# Patient Record
Sex: Male | Born: 1945 | Hispanic: No | Marital: Married | Smoking: Former smoker
Health system: Southern US, Community
[De-identification: ages and names within clinical notes are randomized; demographics above are authoritative.]

## PROBLEM LIST (undated history)

## (undated) DIAGNOSIS — R9431 Abnormal electrocardiogram [ECG] [EKG]: Secondary | ICD-10-CM

## (undated) DIAGNOSIS — C787 Secondary malignant neoplasm of liver and intrahepatic bile duct: Secondary | ICD-10-CM

## (undated) DIAGNOSIS — B1910 Unspecified viral hepatitis B without hepatic coma: Secondary | ICD-10-CM

## (undated) DIAGNOSIS — C189 Malignant neoplasm of colon, unspecified: Secondary | ICD-10-CM

## (undated) DIAGNOSIS — I10 Essential (primary) hypertension: Secondary | ICD-10-CM

## (undated) DIAGNOSIS — K259 Gastric ulcer, unspecified as acute or chronic, without hemorrhage or perforation: Secondary | ICD-10-CM

## (undated) DIAGNOSIS — A048 Other specified bacterial intestinal infections: Secondary | ICD-10-CM

## (undated) DIAGNOSIS — E119 Type 2 diabetes mellitus without complications: Secondary | ICD-10-CM

## (undated) DIAGNOSIS — R000 Tachycardia, unspecified: Secondary | ICD-10-CM

## (undated) DIAGNOSIS — I428 Other cardiomyopathies: Secondary | ICD-10-CM

## (undated) HISTORY — DX: Gastric ulcer, unspecified as acute or chronic, without hemorrhage or perforation: K25.9

## (undated) HISTORY — DX: Abnormal electrocardiogram (ECG) (EKG): R94.31

## (undated) HISTORY — DX: Malignant neoplasm of colon, unspecified: C78.7

## (undated) HISTORY — DX: Other cardiomyopathies: I42.8

## (undated) HISTORY — DX: Essential (primary) hypertension: I10

## (undated) HISTORY — DX: Other specified bacterial intestinal infections: A04.8

## (undated) HISTORY — DX: Tachycardia, unspecified: R00.0

## (undated) HISTORY — DX: Unspecified viral hepatitis B without hepatic coma: B19.10

## (undated) HISTORY — DX: Secondary malignant neoplasm of liver and intrahepatic bile duct: C18.9

---

## 1998-04-04 ENCOUNTER — Other Ambulatory Visit: Admission: RE | Admit: 1998-04-04 | Discharge: 1998-04-04 | Payer: Self-pay | Admitting: Family Medicine

## 1998-05-01 ENCOUNTER — Ambulatory Visit (HOSPITAL_COMMUNITY): Admission: RE | Admit: 1998-05-01 | Discharge: 1998-05-01 | Payer: Self-pay | Admitting: Gastroenterology

## 2013-02-26 ENCOUNTER — Other Ambulatory Visit (HOSPITAL_COMMUNITY): Payer: Self-pay | Admitting: Internal Medicine

## 2013-02-26 DIAGNOSIS — R079 Chest pain, unspecified: Secondary | ICD-10-CM

## 2013-03-03 ENCOUNTER — Ambulatory Visit (HOSPITAL_COMMUNITY)
Admission: RE | Admit: 2013-03-03 | Discharge: 2013-03-03 | Disposition: A | Discharge status: Home / Self Care | Payer: BC Managed Care – PPO | Source: Ambulatory Visit | Attending: Internal Medicine | Admitting: Internal Medicine

## 2013-03-03 DIAGNOSIS — I428 Other cardiomyopathies: Secondary | ICD-10-CM | POA: Insufficient documentation

## 2013-03-03 DIAGNOSIS — R9431 Abnormal electrocardiogram [ECG] [EKG]: Secondary | ICD-10-CM | POA: Insufficient documentation

## 2013-03-03 DIAGNOSIS — R42 Dizziness and giddiness: Secondary | ICD-10-CM | POA: Insufficient documentation

## 2013-03-03 DIAGNOSIS — R0602 Shortness of breath: Secondary | ICD-10-CM | POA: Insufficient documentation

## 2013-03-03 DIAGNOSIS — R079 Chest pain, unspecified: Secondary | ICD-10-CM | POA: Insufficient documentation

## 2013-03-03 DIAGNOSIS — I10 Essential (primary) hypertension: Secondary | ICD-10-CM | POA: Insufficient documentation

## 2013-03-03 DIAGNOSIS — R002 Palpitations: Secondary | ICD-10-CM | POA: Insufficient documentation

## 2013-03-03 HISTORY — PX: OTHER SURGICAL HISTORY: SHX169

## 2013-03-03 MED ORDER — TECHNETIUM TC 99M SESTAMIBI GENERIC - CARDIOLITE
10.4000 | Freq: Once | INTRAVENOUS | Status: AC | PRN
  Administered 2013-03-03: 10 via INTRAVENOUS

## 2013-03-03 MED ORDER — TECHNETIUM TC 99M SESTAMIBI GENERIC - CARDIOLITE
30.8000 | Freq: Once | INTRAVENOUS | Status: AC | PRN
  Administered 2013-03-03: 30.8 via INTRAVENOUS

## 2013-03-03 MED ORDER — REGADENOSON 0.4 MG/5ML IV SOLN
0.4000 mg | Freq: Once | INTRAVENOUS | Status: AC
  Administered 2013-03-03: 0.4 mg via INTRAVENOUS

## 2013-03-03 NOTE — Procedures (Addendum)
[facility] [address] [place] [address] [place] [address] [phone]  Cardiology Nuclear Med Study  Jason Reilly is a 67 y.o. male     MRN : [id number]     DOB: [birth year]  Procedure Date: 03/03/2013  Nuclear Med Background Indication for Stress Test:  Evaluation for Ischemia and Abnormal EKG History:  NO CARDIAC OR RESPIRATORY HISTORY REPORTED BY PT. Cardiac Risk Factors: History of Smoking and Hypertension  Symptoms:  Chest Pain, Dizziness, Palpitations and SOB   Nuclear Pre-Procedure Caffeine/Decaff Intake:  1:00am NPO After: 11AM   IV Site: R Hand  IV 0.9% NS with Angio Cath:  22g  Chest Size (in):  38"  IV Started by: Emmit Pomfret, RN  Height: 5\' 5"  (1.651 m)  Cup Size: n/a  BMI:  Body mass index is 26.79 kg/(m^2). Weight:  161 lb (73.029 kg)   Tech Comments:  N/A    Nuclear Med Study 1 or 2 day study: 1 day  Stress Test Type:  Lexiscan  Order Authorizing Provider:  Zoila Shutter, MD   Resting Radionuclide: Technetium 74m Sestamibi  Resting Radionuclide Dose: 10.4 mCi   Stress Radionuclide:  Technetium 78m Sestamibi  Stress Radionuclide Dose: 30.8 mCi           Stress Protocol Rest HR: 79 Stress HR: 97  Rest BP:126/81 Stress BP: 128/70  Exercise Time (min): n/a METS: n/a          Dose of Adenosine (mg):  n/a Dose of Lexiscan: 0.4 mg  Dose of Atropine (mg): n/a Dose of Dobutamine: n/a mcg/kg/min (at max HR)  Stress Test Technologist: Ernestene Mention, CCT Nuclear Technologist: Gonzella Lex, CNMT   Rest Procedure:  Myocardial perfusion imaging was performed at rest 45 minutes following the intravenous administration of Technetium 78m Sestamibi. Stress Procedure:  The patient received IV Lexiscan 0.4 mg over 15-seconds.  Technetium 70m Sestamibi injected at 30-seconds.  There were no significant changes with Lexiscan.  Quantitative spect images were obtained after a 45 minute delay.  Transient Ischemic Dilatation (Normal  <1.22):  0.98 Lung/Heart Ratio (Normal <0.45):  0.36 QGS EDV:  264 ml QGS ESV:  199 ml LV Ejection Fraction: 25%  Rest ECG: NSR-LVH and T wave inversions inferiorly  Stress ECG: No significant change from baseline ECG  QPS Raw Data Images:  Normal; no motion artifact; normal heart/lung ratio. Stress Images:  Fixed inferobasal and inferoapical attenuation artifact Rest Images:  Fixed inferobasal and inferoapical attenuation artifact Subtraction (SDS):  No evidence of ischemia.  Impression Exercise Capacity:  Lexiscan with no exercise. BP Response:  Normal blood pressure response. Clinical Symptoms:  No significant symptoms noted. ECG Impression:  There are scattered PVCs. Comparison with Prior Nuclear Study: No previous nuclear study performed  Overall Impression:  High risk stress nuclear study with fixed basal inferior and apical inferior bowel artifact..Findings consistent with a non-ischemic cardiomyopathy.  LV Wall Motion:  Severely reduced LVEF of 25% with global hypokinesis. Markedly dilated ventricle with ESV of 199 mL and EDV of 264 ml.  TID ratio is normal.  Chrystie Nose, MD, [facility] Board Certified in Nuclear Cardiology Attending Cardiologist [facility]  Chrystie Nose, MD  03/03/2013 5:07 PM

## 2013-03-05 ENCOUNTER — Encounter: Payer: Self-pay | Admitting: *Deleted

## 2013-03-06 ENCOUNTER — Encounter: Payer: Self-pay | Admitting: Internal Medicine

## 2013-03-09 ENCOUNTER — Encounter: Payer: Self-pay | Admitting: *Deleted

## 2013-03-09 ENCOUNTER — Ambulatory Visit (INDEPENDENT_AMBULATORY_CARE_PROVIDER_SITE_OTHER): Payer: BC Managed Care – PPO | Admitting: Internal Medicine

## 2013-03-09 ENCOUNTER — Encounter: Payer: Self-pay | Admitting: Internal Medicine

## 2013-03-09 VITALS — BP 138/80 | HR 90 | Ht 65.0 in | Wt 154.3 lb

## 2013-03-09 DIAGNOSIS — R9439 Abnormal result of other cardiovascular function study: Secondary | ICD-10-CM

## 2013-03-09 DIAGNOSIS — R931 Abnormal findings on diagnostic imaging of heart and coronary circulation: Secondary | ICD-10-CM | POA: Insufficient documentation

## 2013-03-09 DIAGNOSIS — I10 Essential (primary) hypertension: Secondary | ICD-10-CM

## 2013-03-09 DIAGNOSIS — I5021 Acute systolic (congestive) heart failure: Secondary | ICD-10-CM

## 2013-03-09 MED ORDER — ASPIRIN EC 81 MG PO TBEC: 81.0000 mg | DELAYED_RELEASE_TABLET | Freq: Every day | ORAL | Status: DC

## 2013-03-09 NOTE — Progress Notes (Signed)
[facility]          OFFICE NOTE   Chief Complaint:  Breathing better, but anxious about test results  Primary Care Physician: No primary provider on file.  HPI:  Mr. Stecher is a 67 year old [place] man with a history of hypertension. He has recently established care and was referred for management of hypertension, shortness of breath, and an abnormal EKG. I saw him in the office on 02/25/2013 and noted that he occasionally has some heaviness in his chest and shortness of breath at night mostly when lying down. His breathing has improved while sitting up. Blood pressure was not well controlled. His EKG showed T wave flattening and lateral T-wave inversions and voltage criteria for LVH. I recommended stress testing which she underwent on 03/03/2013. This was a lexiscan scan nuclear stress test. This resulted in a high risk nuclear study with fixed basal inferior and apical inferior bowel attenuation artifacts, however the ventricle was severely dilated with an end systolic volume of 199 cc and end diastolic volume of 264 cc. The 3 times a day ratio was normal. There was global hypokinesis with an ejection fraction of 25%. Mr. Stukey returns today to discuss the findings of this study. He is currently taking lisinopril HCTZ 10/12.5 mg and Toprol-XL 50 mg twice a day.  His shortness of breath and chest pressure have improved since his last visit. Weight today is 154 down from 160 at his last visit. He has had no further chest pressure.  PMHx:  Past Medical History  Diagnosis Date  . Tachycardia   . Abnormal EKG   . Hypertension     Past Surgical History  Procedure Laterality Date  . Nuclear stress test  03/03/2013    High risk - consistent with nonischemic cardiomyopathy    FAMHx:  History reviewed. No pertinent family history.  SOCHx:   reports that he has quit smoking. He does not have any smokeless tobacco history on file. He reports that  drinks alcohol.  He reports that he does not use illicit drugs.  ALLERGIES:  No Known Allergies  ROS: A comprehensive review of systems was negative except for: Constitutional: positive for fatigue Respiratory: positive for dyspnea on exertion Cardiovascular: positive for exertional chest pressure/discomfort, orthopnea and paroxysmal nocturnal dyspnea  HOME MEDS: Current Outpatient Prescriptions  Medication Sig Dispense Refill  . lisinopril-hydrochlorothiazide (PRINZIDE,ZESTORETIC) 10-12.5 MG per tablet Take 1 tablet by mouth daily.      . metoprolol succinate (TOPROL-XL) 50 MG 24 hr tablet Take 50 mg by mouth 2 (two) times daily. Take with or immediately following a meal.      . aspirin EC 81 MG tablet Take 1 tablet (81 mg total) by mouth daily.  90 tablet  3   No current facility-administered medications for this visit.    LABS/IMAGING: No results found for this or any previous visit (from the past 48 hour(s)). No results found.  VITALS: BP 138/80  Pulse 90  Ht 5\' 5"  (1.651 m)  Wt 154 lb 4.8 oz (69.99 kg)  BMI 25.68 kg/m2  EXAM: Physical exam was deferred today  EKG: Sinus rhythm at 90 with occasional PVCs. Lateral ST and T wave abnormalities with LVH.  ASSESSMENT: 1. Hypertension 2. 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   Mr. Route presented today for followup of his nuclear stress test. attempted to explain the abnormalities to him however I am not clear that he is  understanding of English allows me to fully be certain that he understands the risks and benefits of the proposed cardiac catheterization.  Based on a nuclear stress test findings, I would recommend a left and right heart catheterization, to rule out obstructive coronary disease and assess his right heart pressures. I suspect his cardiomyopathy is secondary to hypertension, as there are signs of a dilated ventricle with eccentric hypertrophy.  An echocardiogram may be helpful in  further characterizing this.  For now will continue his lisinopril HCTZ as well as metoprolol. I've recommended taking daily aspirin 81 mg.  We will plan to see him back in the office next week with a [place].  At that time we will discuss cardiac catheterization in more detail.  Chrystie Nose, MD, [facility] Attending Cardiologist [facility]  Nyomie Ehrlich C 03/09/2013, 12:42 PM

## 2013-03-09 NOTE — Patient Instructions (Addendum)
Schedule appointment with Dr. Rennis Golden for next week with a [place] translator to discuss the need for a cardiac catheterization.  Consent will need to be obtained.  Work note given for today and Tuesday 03/16/2013

## 2013-03-16 ENCOUNTER — Ambulatory Visit (INDEPENDENT_AMBULATORY_CARE_PROVIDER_SITE_OTHER): Payer: BC Managed Care – PPO | Admitting: Internal Medicine

## 2013-03-16 ENCOUNTER — Encounter: Payer: Self-pay | Admitting: Internal Medicine

## 2013-03-16 VITALS — BP 130/82 | HR 68 | Ht 65.0 in | Wt 156.3 lb

## 2013-03-16 DIAGNOSIS — F17200 Nicotine dependence, unspecified, uncomplicated: Secondary | ICD-10-CM

## 2013-03-16 DIAGNOSIS — R5381 Other malaise: Secondary | ICD-10-CM

## 2013-03-16 DIAGNOSIS — I420 Dilated cardiomyopathy: Secondary | ICD-10-CM

## 2013-03-16 DIAGNOSIS — Z79899 Other long term (current) drug therapy: Secondary | ICD-10-CM

## 2013-03-16 DIAGNOSIS — Z720 Tobacco use: Secondary | ICD-10-CM

## 2013-03-16 DIAGNOSIS — R5383 Other fatigue: Secondary | ICD-10-CM

## 2013-03-16 DIAGNOSIS — I428 Other cardiomyopathies: Secondary | ICD-10-CM

## 2013-03-16 DIAGNOSIS — D689 Coagulation defect, unspecified: Secondary | ICD-10-CM

## 2013-03-16 NOTE — Progress Notes (Signed)
[facility]          OFFICE NOTE   Chief Complaint:  Breathing better, but anxious about test results  Primary Care Physician: Jearld Lesch, MD  HPI:  Mr. Detjen is a 67 year old [place] man with a history of hypertension. He has recently established care and was referred for management of hypertension, shortness of breath, and an abnormal EKG. I saw him in the office on 02/25/2013 and noted that he occasionally has some heaviness in his chest and shortness of breath at night mostly when lying down. His breathing has improved while sitting up. Blood pressure was not well controlled. His EKG showed T wave flattening and lateral T-wave inversions and voltage criteria for LVH. I recommended stress testing which she underwent on 03/03/2013. This was a lexiscan scan nuclear stress test. This resulted in a high risk nuclear study with fixed basal inferior and apical inferior bowel attenuation artifacts, however the ventricle was severely dilated with an end systolic volume of 199 cc and end diastolic volume of 264 cc. The 3 times a day ratio was normal. There was global hypokinesis with an ejection fraction of 25%. Mr. Mandala returns today to discuss the findings of this study. He is currently taking lisinopril HCTZ 10/12.5 mg and Toprol-XL 50 mg twice a day.  His shortness of breath and chest pressure have improved since his last visit. Weight today is 154 down from 160 at his last visit. He has had no further chest pressure.  PMHx:  Past Medical History  Diagnosis Date  . Tachycardia   . Abnormal EKG   . Hypertension     Past Surgical History  Procedure Laterality Date  . Nuclear stress test  03/03/2013    High risk - consistent with nonischemic cardiomyopathy    FAMHx:  No family history on file.  SOCHx:   reports that he has quit smoking. He does not have any smokeless tobacco history on file. He reports that  drinks alcohol. He reports that he does not  use illicit drugs.  ALLERGIES:  No Known Allergies  ROS: A comprehensive review of systems was negative except for: Constitutional: positive for fatigue Respiratory: positive for dyspnea on exertion Cardiovascular: positive for exertional chest pressure/discomfort, orthopnea and paroxysmal nocturnal dyspnea  HOME MEDS: Current Outpatient Prescriptions  Medication Sig Dispense Refill  . aspirin EC 81 MG tablet Take 1 tablet (81 mg total) by mouth daily.  90 tablet  3  . lisinopril-hydrochlorothiazide (PRINZIDE,ZESTORETIC) 10-12.5 MG per tablet Take 1 tablet by mouth daily.      . metoprolol succinate (TOPROL-XL) 50 MG 24 hr tablet Take 50 mg by mouth 2 (two) times daily. Take with or immediately following a meal.       No current facility-administered medications for this visit.    LABS/IMAGING: No results found for this or any previous visit (from the past 48 hour(s)). No results found.  VITALS: BP 130/82  Pulse 68  Ht 5\' 5"  (1.651 m)  Wt 156 lb 4.8 oz (70.897 kg)  BMI 26.01 kg/m2  EXAM: Physical exam was deferred today  EKG: Sinus rhythm at 90 with occasional PVCs. Lateral ST and T wave abnormalities with LVH.  ASSESSMENT: 1. Hypertension 2. 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  Mr. Kohlmann returns today with a [place] translator to further discuss his cardiomyopathy.  I wish to make sure he fully understands the risks of his weak heart and what  I would recommend. We had a discussion regarding the possible causes of his cardiomyopathy, which likely are related to long-standing untreated hypertension +/- long-standing alcohol use.  He does have risk factors for coronary artery disease, however and has had some chest pressure.  I have recommended left and right heart catheterization to fully exclude obstructive coronary disease and estimated right heart pressures as well as cardiac output.  Overall he is feeling markedly better on  beta blocker and lisinopril HCTZ. He was started on daily low-dose aspirin and is tolerating that without difficulty. He has also stopped drinking.  After discussing the risks and benefits of cardiac catheterization with him and assuring that they were adequately translated and understood. He did provide informed consent for cardiac catheterization. He wishes however to wait 2 weeks as he has an upcoming vacation. We also discussed the risk of sudden cardiac death which is elevated in patients with a cardiomyopathy such as his. We discussed the possible need of an implanted defibrillator in the future. In addition I mentioned the possible need for short-term antiarrhythmic protection with a life vest. He is declined a life vest at this time but may consider a implanted defibrillator at some point in the future.  We will obtain a preoperative chest x-ray and blood work and proceed with cardiac catheterization in the next few weeks.  Chrystie Nose, MD, [facility] Attending Cardiologist [facility]  HILTY,Kenneth C 03/16/2013, 5:42 PM

## 2013-03-16 NOTE — Patient Instructions (Signed)
  Your physician has requested that you have a cardiac catheterization. Cardiac catheterization is used to diagnose and/or treat various heart conditions. Doctors may recommend this procedure for a number of different reasons. The most common reason is to evaluate chest pain. Chest pain can be a symptom of coronary artery disease (CAD), and cardiac catheterization can show whether plaque is narrowing or blocking your heart's arteries. This procedure is also used to evaluate the valves, as well as measure the blood flow and oxygen levels in different parts of your heart. For further information please visit [email or web address]

## 2013-03-23 ENCOUNTER — Ambulatory Visit
Admission: RE | Admit: 2013-03-23 | Discharge: 2013-03-23 | Disposition: A | Discharge status: Home / Self Care | Payer: BC Managed Care – PPO | Source: Ambulatory Visit | Attending: Internal Medicine | Admitting: Internal Medicine

## 2013-03-23 DIAGNOSIS — I420 Dilated cardiomyopathy: Secondary | ICD-10-CM

## 2013-03-23 DIAGNOSIS — Z720 Tobacco use: Secondary | ICD-10-CM

## 2013-03-23 LAB — BASIC METABOLIC PANEL
BUN: 13 mg/dL (ref 6–23)
Potassium: 3.9 mEq/L (ref 3.5–5.3)
Sodium: 140 mEq/L (ref 135–145)

## 2013-03-23 LAB — CBC
HCT: 44.7 % (ref 39.0–52.0)
MCV: 87 fL (ref 78.0–100.0)
Platelets: 236 10*3/uL (ref 150–400)
RBC: 5.14 MIL/uL (ref 4.22–5.81)
WBC: 7.8 10*3/uL (ref 4.0–10.5)

## 2013-03-23 LAB — APTT: aPTT: 31 seconds (ref 24–37)

## 2013-03-23 LAB — TSH: TSH: 1.273 u[IU]/mL (ref 0.350–4.500)

## 2013-03-26 ENCOUNTER — Other Ambulatory Visit: Payer: Self-pay | Admitting: *Deleted

## 2013-03-26 ENCOUNTER — Ambulatory Visit: Payer: BC Managed Care – PPO | Admitting: Internal Medicine

## 2013-03-26 DIAGNOSIS — Z01810 Encounter for preprocedural cardiovascular examination: Secondary | ICD-10-CM

## 2013-03-29 ENCOUNTER — Encounter (HOSPITAL_COMMUNITY): Payer: Self-pay | Admitting: Pharmacy Technician

## 2013-03-29 ENCOUNTER — Encounter (HOSPITAL_COMMUNITY)
Admission: RE | Disposition: A | Discharge status: Home / Self Care | Payer: Self-pay | Source: Ambulatory Visit | Attending: Internal Medicine

## 2013-03-29 ENCOUNTER — Ambulatory Visit (HOSPITAL_COMMUNITY)
Admission: RE | Admit: 2013-03-29 | Discharge: 2013-03-29 | Disposition: A | Discharge status: Home / Self Care | Payer: BC Managed Care – PPO | Source: Ambulatory Visit | Attending: Internal Medicine | Admitting: Internal Medicine

## 2013-03-29 DIAGNOSIS — I10 Essential (primary) hypertension: Secondary | ICD-10-CM

## 2013-03-29 DIAGNOSIS — I428 Other cardiomyopathies: Secondary | ICD-10-CM | POA: Insufficient documentation

## 2013-03-29 DIAGNOSIS — R9439 Abnormal result of other cardiovascular function study: Secondary | ICD-10-CM | POA: Insufficient documentation

## 2013-03-29 DIAGNOSIS — Z87891 Personal history of nicotine dependence: Secondary | ICD-10-CM | POA: Insufficient documentation

## 2013-03-29 DIAGNOSIS — Z7982 Long term (current) use of aspirin: Secondary | ICD-10-CM | POA: Insufficient documentation

## 2013-03-29 DIAGNOSIS — R0602 Shortness of breath: Secondary | ICD-10-CM

## 2013-03-29 DIAGNOSIS — R931 Abnormal findings on diagnostic imaging of heart and coronary circulation: Secondary | ICD-10-CM

## 2013-03-29 DIAGNOSIS — I5021 Acute systolic (congestive) heart failure: Secondary | ICD-10-CM

## 2013-03-29 DIAGNOSIS — R000 Tachycardia, unspecified: Secondary | ICD-10-CM | POA: Insufficient documentation

## 2013-03-29 DIAGNOSIS — Z79899 Other long term (current) drug therapy: Secondary | ICD-10-CM | POA: Insufficient documentation

## 2013-03-29 DIAGNOSIS — Z01810 Encounter for preprocedural cardiovascular examination: Secondary | ICD-10-CM

## 2013-03-29 HISTORY — PX: LEFT AND RIGHT HEART CATHETERIZATION WITH CORONARY ANGIOGRAM: SHX5449

## 2013-03-29 LAB — POCT I-STAT 3, ART BLOOD GAS (G3+)
Acid-Base Excess: 2 mmol/L (ref 0.0–2.0)
O2 Saturation: 91 %
pCO2 arterial: 42.5 mmHg (ref 35.0–45.0)

## 2013-03-29 LAB — POCT I-STAT 3, VENOUS BLOOD GAS (G3P V)
TCO2: 28 mmol/L (ref 0–100)
pCO2, Ven: 46.5 mmHg (ref 45.0–50.0)
pH, Ven: 7.372 — ABNORMAL HIGH (ref 7.250–7.300)

## 2013-03-29 SURGERY — LEFT AND RIGHT HEART CATHETERIZATION WITH CORONARY ANGIOGRAM
Anesthesia: LOCAL

## 2013-03-29 MED ORDER — SODIUM CHLORIDE 0.9 % IJ SOLN: 3.0000 mL | INTRAMUSCULAR | Status: DC | PRN

## 2013-03-29 MED ORDER — ASPIRIN 81 MG PO CHEW: 324.0000 mg | CHEWABLE_TABLET | Freq: Once | ORAL | Status: AC

## 2013-03-29 MED ORDER — FENTANYL CITRATE 0.05 MG/ML IJ SOLN
INTRAMUSCULAR | Status: AC
  Filled 2013-03-29: qty 2

## 2013-03-29 MED ORDER — SODIUM CHLORIDE 0.9 % IV SOLN
INTRAVENOUS | Status: DC
  Administered 2013-03-29: 08:00:00 via INTRAVENOUS

## 2013-03-29 MED ORDER — MIDAZOLAM HCL 2 MG/2ML IJ SOLN
INTRAMUSCULAR | Status: AC
  Filled 2013-03-29: qty 2

## 2013-03-29 MED ORDER — SODIUM CHLORIDE 0.9 % IV SOLN: INTRAVENOUS | Status: AC

## 2013-03-29 MED ORDER — ACETAMINOPHEN 325 MG PO TABS: 650.0000 mg | ORAL_TABLET | ORAL | Status: DC | PRN

## 2013-03-29 MED ORDER — LIDOCAINE HCL (PF) 1 % IJ SOLN
INTRAMUSCULAR | Status: AC
  Filled 2013-03-29: qty 30

## 2013-03-29 MED ORDER — HEPARIN (PORCINE) IN NACL 2-0.9 UNIT/ML-% IJ SOLN
INTRAMUSCULAR | Status: AC
  Filled 2013-03-29: qty 1000

## 2013-03-29 MED ORDER — ONDANSETRON HCL 4 MG/2ML IJ SOLN: 4.0000 mg | Freq: Four times a day (QID) | INTRAMUSCULAR | Status: DC | PRN

## 2013-03-29 MED ORDER — ASPIRIN 81 MG PO CHEW
CHEWABLE_TABLET | ORAL | Status: AC
  Administered 2013-03-29: 324 mg via ORAL
  Filled 2013-03-29: qty 4

## 2013-03-29 NOTE — CV Procedure (Signed)
CARDIAC CATHETERIZATION REPORT  Jason Reilly   [id number] 15-Jul-1946  Performing Cardiologist: Chrystie Nose Primary Physician: Jearld Lesch, MD Primary Cardiologist:  Dr. Rennis Golden  Procedures Performed:  Left Heart Catheterization via 5 Fr left femoral artery access  Right Heart Catheterization via 7 Fr right femoral vein access  Indication(s): dyspnea, orthopnea and paroxysmal nocturnal dyspnea  Pre-Procedural Non-invasive testing: Abnormal without evidence of ischemia.  History: 67 y.o. male [place] with a history of hypertension. He has recently established care and was referred for management of hypertension, shortness of breath, and an abnormal EKG. I saw him in the office on 02/25/2013 and noted that he occasionally has some heaviness in his chest and shortness of breath at night mostly when lying down. His breathing has improved while sitting up. Blood pressure was not well controlled. His EKG showed T wave flattening and lateral T-wave inversions and voltage criteria for LVH. I recommended stress testing which she underwent on 03/03/2013. This was a lexiscan scan nuclear stress test. This resulted in a high risk nuclear study with fixed basal inferior and apical inferior bowel attenuation artifacts, however the ventricle was severely dilated with an end systolic volume of 199 cc and end diastolic volume of 264 cc. The 3 times a day ratio was normal. There was global hypokinesis with an ejection fraction of 25%.  He is currently taking lisinopril HCTZ 10/12.5 mg and Toprol-XL 50 mg twice a day. His shortness of breath and chest pressure have improved since his last visit. Based on the NST findings, scar or balanced ischemia could not be ruled-out. He is referred for [facility] to further assess his cardiomyopathy.  Consent: The procedure with Risks/Benefits/Alternatives and Indications were reviewed with the patient (and family).  All questions were answered with a [place] translator  present.    Risks / Complications include, but not limited to: Death, MI, CVA/TIA, VF/VT (with defibrillation), Bradycardia (need for temporary pacer placement), contrast induced nephropathy, bleeding / bruising / hematoma / pseudoaneurysm, vascular or coronary injury (with possible emergent CT or Vascular Surgery), adverse medication reactions, infection.    Consent: Risks of procedure as well as the alternatives and risks of each were explained to the (patient/caregiver).  Consent for procedure obtained.  Procedure: The patient was brought to the 2nd Floor [facility] in the fasting state and prepped and draped in the usual sterile fashion for (Right groin) access. A modified Allen's test with plethysmography was performed on the right wrist demonstrating adequate Ulnar Artery collateral flow.    Time Out: Verified patient identification, verified procedure, site/side was marked, verified correct patient position, special equipment/implants available, radiation safety measures in place (including badges and shielding), medications/allergies/relevent history reviewed, required imaging and test results available.  Performed  Procedure: The right femoral head was identified using tactile and fluoroscopic technique.  The right groin was anesthetized with 1% subcutaneous Lidocaine.  The right Common Femoral Artery was accessed using the Modified Seldinger Technique with placement of (5 Fr) sheath using the Seldinger technique. The right femoral vein was accessed using the modified Seldinger technique with placement of a 7 French venous sheath. The sheaths were aspirated and flushed. A Swan-Ganz catheter was advanced through the venous sheath into the RA, RV, PCWP and PA positions. Saturations were obtained and cardiac output by thermodilution was measured. A 5 Fr JL4 Catheter was advanced of over a Standard J wire into the ascending Aorta.  The catheter was used to engage the left  coronary artery.  Multiple cineangiographic  views of the left coronary artery system(s) were performed. A 5 Fr JR4 Catheter was advanced of over a Safety J wire into the ascending Aorta.  The catheter was used to engage the right coronary artery.  Multiple cineangiographic views of the right coronary artery system(s) were performed. This catheter was then exchanged over the Standard J wire for an angled Pigtail catheter that was advanced across the Aortic Valve.  LV hemodynamics were measured (and Left Ventriculography was performed).  LV hemodynamics were then re-sampled, and the catheter was pulled back across the Aortic Valve for measurement of "pull-back" gradient.  The catheter and the wire was removed completely out of the body. The patient was transferred to the holding area where the sheath was removed with manual pressure held for hemostasis.   Recovery: The patient was transported to the cath lab holding area in stable condition.   The patient  was stable before, during and following the procedure.   Patient did tolerate procedure well. There were not complications.  EBL: Minimal  Medications:  Premedication: none  Sedation:  1 mg IV Versed, 50 mcg IV Fentanyl  Contrast:  70 ml Omnipaque  5 cc 1% lidocaine  Hemodynamics:  Central Aortic Pressure / Mean Aortic Pressure: 136/66  LV Pressure / LV End diastolic Pressure:  11  Left Ventriculography:  EF: 20-25%  Wall Motion: Severe global hypokinesis  Right Heart Data:  RA - 3  RV - 28/3  PA - 26/9 (16)   PCWP - 5  TPG - 11  FCO/CI - 3.3 L/min, 1.8 L/min  TDCO/CI - 4.49 L/min, 2.5 L/min  PVR - 2.45 Wood units ( dynscm?5/80)  PA Sat% - 57  AO Sat% - 91  Coronary Angiographic Data:  Left Main:  Large vessel which branches in the normal fashion to the left anterior descending and left circumflex arteries.  Left Anterior Descending (LAD):  No significant stenoses, reaches around the apex  1st diagonal (D1):  No  significant disease  Circumflex (LCx):  Codominant . Very large vessel that gives off several marginal branches  1st obtuse marginal:  No significant obstruction 2nd obtuse marginal:  No significant obstruction    Right Coronary Artery: Codominant. No significant stenoses.  posterior descending artery: No stenoses  posterior lateral branch:  No stenoses   Impression: 1.  no significant obstructive coronary disease 2.  severe global hypokinesis, EF 20-25% 3.  markedly reduced cardiac output and index 4.  compensated right and left heart pressures   Plan: 1.  continue current medical management 2.  recheck echocardiogram in 6 months 3.  if there's no improvement in LVEF, he is a candidate for AICD. 4.  okay for discharge home today followup with me in 2-3 weeks  The case and results was discussed with the patient and family if available.  The case and results was not discussed with the patient's PCP. The case and results was discussed with the patient's Cardiologist.  Time Spent Directly with the Patient:  60 minutes  Chrystie Nose, MD, [facility] Attending Cardiologist [facility]  Juris Gosnell C 03/29/2013, 10:03 AM

## 2013-03-29 NOTE — H&P (Signed)
   INTERVAL PROCEDURE H&P  History and Physical Interval Note:  03/29/2013 8:44 AM  Jason Reilly has presented today for their planned procedure. The various methods of treatment have been discussed with the patient and family. After consideration of risks, benefits and other options for treatment, the patient has consented to the procedure.  The patients' outpatient history has been reviewed, patient examined, and no change in status from most recent office note within the past 30 days. I have reviewed the patients' chart and labs and will proceed as planned. Questions were answered to the patient's satisfaction.   Chrystie Nose, MD, [facility] Attending Cardiologist [facility]  HILTY,Kenneth C 03/29/2013, 8:44 AM

## 2013-04-13 ENCOUNTER — Ambulatory Visit (INDEPENDENT_AMBULATORY_CARE_PROVIDER_SITE_OTHER): Payer: BC Managed Care – PPO | Admitting: Internal Medicine

## 2013-04-13 ENCOUNTER — Encounter: Payer: Self-pay | Admitting: Internal Medicine

## 2013-04-13 VITALS — BP 136/86 | HR 60 | Ht 65.0 in | Wt 161.9 lb

## 2013-04-13 DIAGNOSIS — I10 Essential (primary) hypertension: Secondary | ICD-10-CM

## 2013-04-13 DIAGNOSIS — R0989 Other specified symptoms and signs involving the circulatory and respiratory systems: Secondary | ICD-10-CM

## 2013-04-13 DIAGNOSIS — I5021 Acute systolic (congestive) heart failure: Secondary | ICD-10-CM

## 2013-04-13 DIAGNOSIS — I428 Other cardiomyopathies: Secondary | ICD-10-CM

## 2013-04-13 DIAGNOSIS — R0600 Dyspnea, unspecified: Secondary | ICD-10-CM

## 2013-04-13 NOTE — Patient Instructions (Signed)
Your physician has requested that you have an echocardiogram. Echocardiography is a painless test that uses sound waves to create images of your heart. It provides your doctor with information about the size and shape of your heart and how well your heart's chambers and valves are working. This procedure takes approximately one hour. There are no restrictions for this procedure. Please schedule in 3 months.   Your physician recommends that you schedule a follow-up appointment after your echocardiogram.

## 2013-04-13 NOTE — Progress Notes (Signed)
[facility]          OFFICE NOTE   Chief Complaint:  Breathing better, but anxious about test results  Primary Care Physician: Jason Lesch, MD  HPI:  Jason Reilly is a 67 year old [place] man with a history of hypertension. He has recently established care and was referred for management of hypertension, shortness of breath, and an abnormal EKG. I saw him in the office on 02/25/2013 and noted that he occasionally has some heaviness in his chest and shortness of breath at night mostly when lying down. His breathing has improved while sitting up. Blood pressure was not well controlled. His EKG showed T wave flattening and lateral T-wave inversions and voltage criteria for LVH. I recommended stress testing which she underwent on 03/03/2013. This was a lexiscan scan nuclear stress test. This resulted in a high risk nuclear study with fixed basal inferior and apical inferior bowel attenuation artifacts, however the ventricle was severely dilated with an end systolic volume of 199 cc and end diastolic volume of 264 cc. The 3 times a day ratio was normal. There was global hypokinesis with an ejection fraction of 25%. He underwent heart catheterization by myself on 03/29/2013, which was a right and left heart catheterization. This demonstrated relatively low right heart pressures, and mild nonobstructive coronary disease. The ejection fraction is significantly reduced in cardiac output and index were both reduced.  He tolerated the procedure well did not have any post procedural vascular complications. He returns today feeling even better, without any worsening shortness of breath, chest pain, or other heart failure symptoms. His weight has been fairly stable.  PMHx:  Past Medical History  Diagnosis Date  . Tachycardia   . Abnormal EKG   . Hypertension     Past Surgical History  Procedure Laterality Date  . Nuclear stress test  03/03/2013    High risk - consistent  with nonischemic cardiomyopathy    FAMHx:  No family history on file.  SOCHx:   reports that he has quit smoking. He does not have any smokeless tobacco history on file. He reports that  drinks alcohol. He reports that he does not use illicit drugs.  ALLERGIES:  No Known Allergies  ROS: A comprehensive review of systems was negative except for: Constitutional: positive for fatigue Respiratory: positive for dyspnea on exertion Cardiovascular: positive for exertional chest pressure/discomfort, orthopnea and paroxysmal nocturnal dyspnea  HOME MEDS: Current Outpatient Prescriptions  Medication Sig Dispense Refill  . aspirin EC 81 MG tablet Take 1 tablet (81 mg total) by mouth daily.  90 tablet  3  . lisinopril-hydrochlorothiazide (PRINZIDE,ZESTORETIC) 10-12.5 MG per tablet Take 1 tablet by mouth daily.      . metoprolol succinate (TOPROL-XL) 50 MG 24 hr tablet Take 50 mg by mouth 2 (two) times daily. Take with or immediately following a meal.       No current facility-administered medications for this visit.    LABS/IMAGING: No results found for this or any previous visit (from the past 48 hour(s)). No results found.  VITALS: BP 136/86  Pulse 60  Ht 5\' 5"  (1.651 m)  Wt 161 lb 14.4 oz (73.437 kg)  BMI 26.94 kg/m2  EXAM: Physical exam was deferred today  EKG: Deferred  ASSESSMENT: 1. Hypertension 2. 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   Cardiac catheterization revealed no obstructive coronary disease. His right heart pressures are now very low. I recommended continuing his  current medications, and we'll recheck an echocardiogram in 3 months. Plan is to see him back at that time if his EF does not improve greater than 35%, refer him to Dr. Royann Shivers for evaluation of an AICD for primary prevention of sudden cardiac death.   Jason Nose, MD, [facility] Attending Cardiologist [facility]  Jason Reilly 04/13/2013, 1:11 PM

## 2013-05-04 ENCOUNTER — Ambulatory Visit (HOSPITAL_COMMUNITY): Payer: BC Managed Care – PPO

## 2013-05-05 ENCOUNTER — Other Ambulatory Visit: Payer: Self-pay | Admitting: Internal Medicine

## 2013-05-05 NOTE — Telephone Encounter (Signed)
Rx was sent to pharmacy electronically. 

## 2013-05-12 ENCOUNTER — Ambulatory Visit: Payer: BC Managed Care – PPO | Admitting: Internal Medicine

## 2013-06-30 ENCOUNTER — Encounter (HOSPITAL_COMMUNITY): Payer: Self-pay | Admitting: Internal Medicine

## 2013-06-30 ENCOUNTER — Telehealth (HOSPITAL_COMMUNITY): Payer: Self-pay | Admitting: Internal Medicine

## 2013-07-06 ENCOUNTER — Ambulatory Visit (HOSPITAL_COMMUNITY): Payer: BC Managed Care – PPO

## 2013-07-15 ENCOUNTER — Ambulatory Visit: Payer: BC Managed Care – PPO | Admitting: Internal Medicine

## 2014-10-06 ENCOUNTER — Encounter (HOSPITAL_COMMUNITY): Payer: Self-pay | Admitting: Internal Medicine

## 2014-10-17 ENCOUNTER — Other Ambulatory Visit: Payer: Self-pay | Admitting: Nurse Practitioner

## 2014-10-17 DIAGNOSIS — K769 Liver disease, unspecified: Secondary | ICD-10-CM

## 2014-10-20 ENCOUNTER — Telehealth: Payer: Self-pay | Admitting: Hematology

## 2014-10-20 NOTE — Telephone Encounter (Signed)
S/W DAWN @ [facility] AND GAVE NP APPT FOR 01/16 @ 2:30 W/DR. FENG

## 2014-11-02 ENCOUNTER — Encounter: Payer: Self-pay | Admitting: Hematology

## 2014-11-02 ENCOUNTER — Ambulatory Visit (HOSPITAL_BASED_OUTPATIENT_CLINIC_OR_DEPARTMENT_OTHER): Payer: BLUE CROSS/BLUE SHIELD

## 2014-11-02 ENCOUNTER — Ambulatory Visit (HOSPITAL_BASED_OUTPATIENT_CLINIC_OR_DEPARTMENT_OTHER): Payer: BLUE CROSS/BLUE SHIELD | Admitting: Hematology

## 2014-11-02 ENCOUNTER — Ambulatory Visit: Payer: BLUE CROSS/BLUE SHIELD

## 2014-11-02 ENCOUNTER — Telehealth: Payer: Self-pay | Admitting: Hematology

## 2014-11-02 VITALS — BP 168/89 | HR 95 | Temp 98.5°F | Resp 19 | Ht 65.0 in | Wt 138.4 lb

## 2014-11-02 DIAGNOSIS — C801 Malignant (primary) neoplasm, unspecified: Secondary | ICD-10-CM

## 2014-11-02 DIAGNOSIS — B1910 Unspecified viral hepatitis B without hepatic coma: Secondary | ICD-10-CM

## 2014-11-02 DIAGNOSIS — I428 Other cardiomyopathies: Secondary | ICD-10-CM

## 2014-11-02 DIAGNOSIS — I255 Ischemic cardiomyopathy: Secondary | ICD-10-CM

## 2014-11-02 DIAGNOSIS — K769 Liver disease, unspecified: Secondary | ICD-10-CM

## 2014-11-02 LAB — CBC & DIFF AND RETIC
BASO%: 0.3 % (ref 0.0–2.0)
BASOS ABS: 0 10*3/uL (ref 0.0–0.1)
EOS%: 2 % (ref 0.0–7.0)
Eosinophils Absolute: 0.2 10*3/uL (ref 0.0–0.5)
HEMATOCRIT: 36.9 % — AB (ref 38.4–49.9)
HEMOGLOBIN: 12.3 g/dL — AB (ref 13.0–17.1)
Immature Retic Fract: 11.8 % — ABNORMAL HIGH (ref 3.00–10.60)
LYMPH#: 2.3 10*3/uL (ref 0.9–3.3)
LYMPH%: 20.4 % (ref 14.0–49.0)
MCH: 28.9 pg (ref 27.2–33.4)
MCHC: 33.3 g/dL (ref 32.0–36.0)
MCV: 86.6 fL (ref 79.3–98.0)
MONO#: 1 10*3/uL — AB (ref 0.1–0.9)
MONO%: 9.2 % (ref 0.0–14.0)
NEUT%: 68.1 % (ref 39.0–75.0)
NEUTROS ABS: 7.7 10*3/uL — AB (ref 1.5–6.5)
Platelets: 339 10*3/uL (ref 140–400)
RBC: 4.26 10*6/uL (ref 4.20–5.82)
RDW: 16.2 % — AB (ref 11.0–14.6)
Retic %: 2.48 % — ABNORMAL HIGH (ref 0.80–1.80)
Retic Ct Abs: 105.65 10*3/uL — ABNORMAL HIGH (ref 34.80–93.90)
WBC: 11.4 10*3/uL — AB (ref 4.0–10.3)

## 2014-11-02 LAB — COMPREHENSIVE METABOLIC PANEL (CC13)
ALK PHOS: 568 U/L — AB (ref 40–150)
ALT: 29 U/L (ref 0–55)
AST: 63 U/L — ABNORMAL HIGH (ref 5–34)
Albumin: 3.2 g/dL — ABNORMAL LOW (ref 3.5–5.0)
Anion Gap: 10 mEq/L (ref 3–11)
BILIRUBIN TOTAL: 1.12 mg/dL (ref 0.20–1.20)
BUN: 12 mg/dL (ref 7.0–26.0)
CO2: 27 mEq/L (ref 22–29)
Calcium: 9.6 mg/dL (ref 8.4–10.4)
Chloride: 99 mEq/L (ref 98–109)
Creatinine: 0.8 mg/dL (ref 0.7–1.3)
GLUCOSE: 88 mg/dL (ref 70–140)
POTASSIUM: 4.3 meq/L (ref 3.5–5.1)
SODIUM: 136 meq/L (ref 136–145)
Total Protein: 8.5 g/dL — ABNORMAL HIGH (ref 6.4–8.3)

## 2014-11-02 LAB — URIC ACID (CC13): URIC ACID, SERUM: 6.2 mg/dL (ref 2.6–7.4)

## 2014-11-02 MED ORDER — MIRTAZAPINE 15 MG PO TABS: 15.0000 mg | ORAL_TABLET | Freq: Every day | ORAL | Status: DC

## 2014-11-02 NOTE — Progress Notes (Signed)
Checked in new pt with no financial concerns at this time. ° °

## 2014-11-02 NOTE — Telephone Encounter (Signed)
Gave avs & cal for Jan. °

## 2014-11-02 NOTE — Progress Notes (Signed)
[place]  [phone] [phone]) [phone]  Clinic New Consult Note   Patient Care Team: [name], MD as PCP - General (Specialist) 11/02/2014  CHIEF COMPLAINTS/PURPOSE OF CONSULTATION:  Abnormal CT scan, suspicion for metastatic malignancy  HISTORY OF PRESENTING ILLNESS:  Jason Reilly 69 y.o. male is here because of abnormal CT findings, which is very suspicious for malignancy. He is on ranitidine from [place], has been on in the [place] for 16 years. He came in with his son and an interpreter.  He has been feeling fatigued since two month ago. He is still able to do all ADLs. He otherwise denies any pain, bloating or nausea.  He lost about 20lbs in 3 month. His appetite is lower than before, eats less, no change of his bowl habits.  She denied any hematochezia or melana. Per his son, he has had some personality changes daily, irritable, slightly confused some time.  He was evaluated by his primary care physician. Lab test reviewed hepatitis B infection, which he did not know before, and elevated alkaline phosphatase, his liver function and the rest of the liver function was not remarkable. [place] of abdomen was obtained on 07/22/2014, which showed diffusely abnormal liver with multiple echogenic lesions. CT of abdomen with and without contrast was done on 08/26/2014, which reviewed here at a medically with multiple large partially calcified hepatic masses consistent with metastatic disease. Mild retroperitoneal adenopathy with the largest node measuring 1.6 cm. And nonspecific 1.4 cm left adrenal nodule was also noticed. His tumor marker showed CEA greater than 10,000, CA 19-9 12,929, AFP 3.2 (normal). He was referred to [facility] and was evaluated by nurse practitioner [name]. Treatment for hepatitis B was not recommended based on his virus load.  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 He  was evaluated by a cardiologist in 2014. He denies any significant dyspnea on exertion. No leg swollen.  MEDICAL HISTORY:  Past Medical History  Diagnosis Date  . Tachycardia   . Abnormal EKG   . Hypertension     SURGICAL HISTORY: Past Surgical History  Procedure Laterality Date  . Nuclear stress test  03/03/2013    High risk - consistent with nonischemic cardiomyopathy  . Left and right heart catheterization with coronary angiogram N/A 03/29/2013    Procedure: LEFT AND RIGHT HEART CATHETERIZATION WITH CORONARY ANGIOGRAM;  Surgeon: Pixie Casino, MD;  Location: [facility] CATH LAB;  Service: Cardiovascular;  Laterality: N/A;    SOCIAL HISTORY: History   Social History  . Marital Status: Unknown    Spouse Name: N/A    Number of Children: N/A  . Years of Education: N/A   Occupational History  . Not on file.   Social History Main Topics  . Smoking status: Former Research scientist (life sciences)  . Smokeless tobacco: Not on file  . Alcohol Use: Yes     Comment: occasional  . Drug Use: No  . Sexual Activity: Not on file   Other Topics Concern  . Not on file   Social History Narrative    FAMILY HISTORY: No family history of liver disease or malignancy.  ALLERGIES:  has No Known Allergies.  MEDICATIONS:  Current Outpatient Prescriptions  Medication Sig Dispense Refill  . aspirin EC 81 MG tablet Take 1 tablet (81 mg total) by mouth daily. (Patient not taking: Reported on 11/02/2014) 90 tablet 3  . lisinopril-hydrochlorothiazide (PRINZIDE,ZESTORETIC) 10-12.5 MG per tablet TAKE 1 TABLET BY MOUTH EVERY  DAY (Patient not taking: Reported on 11/02/2014) 90 tablet 3  . metoprolol (LOPRESSOR) 50 MG tablet TAKE 1 TABLET BY MOUTH TWICE A DAY (Patient not taking: Reported on 11/02/2014) 180 tablet 3  . metoprolol succinate (TOPROL-XL) 50 MG 24 hr tablet Take 50 mg by mouth 2 (two) times daily. Take with or immediately following a meal.    .        No current facility-administered medications for this visit.     REVIEW OF SYSTEMS:   Constitutional: Denies fevers, chills or abnormal night sweats, (+) fatigue  Eyes: Denies blurriness of vision, double vision or watery eyes Ears, nose, mouth, throat, and face: Denies mucositis or sore throat Respiratory: Denies cough, dyspnea or wheezes Cardiovascular: Denies palpitation, chest discomfort or lower extremity swelling Gastrointestinal: (+) anorexia.   Denies nausea, heartburn or change in bowel habits Skin: Denies abnormal skin rashes Lymphatics: Denies new lymphadenopathy or easy bruising Neurological:Denies numbness, tingling or new weaknesses Behavioral/Psych: Mood is stable, no new changes, (+) insomnia All other systems were reviewed with the patient and are negative.  PHYSICAL EXAMINATION: ECOG PERFORMANCE STATUS: 1 - Symptomatic but completely ambulatory  Filed Vitals:   11/02/14 1419  BP: 168/89  Pulse: 95  Temp: 98.5 F (36.9 C)  Resp: 19   Filed Weights   11/02/14 1419  Weight: 138 lb 6.4 oz (62.778 kg)    GENERAL:alert, no distress and comfortable SKIN: skin color, texture, turgor are normal, (+) several large papular skin rash on the neck and face, no other rashes or significant lesions EYES: normal, conjunctiva are pink and non-injected, sclera clear OROPHARYNX:no exudate, no erythema and lips, buccal mucosa, and tongue normal  NECK: supple, thyroid normal size, non-tender, without nodularity LYMPH:  no palpable lymphadenopathy in the cervical, axillary or inguinal LUNGS: clear to auscultation and percussion with normal breathing effort HEART: regular rate & rhythm and no murmurs and no lower extremity edema ABDOMEN:abdomen soft, non-tender, (+) hepatomegaly, liver is palpable 2.5 cm under rib cage, soft nontender, no splenomegaly and normal bowel sounds Musculoskeletal:no cyanosis of digits and no clubbing  PSYCH: alert & oriented x 3 with fluent speech NEURO: no focal motor/sensory deficits  LABORATORY DATA:  I have  reviewed the data as listed Lab Results  Component Value Date   WBC 11.4* 11/02/2014   HGB 12.3* 11/02/2014   HCT 36.9* 11/02/2014   MCV 86.6 11/02/2014   PLT 339 11/02/2014    Recent Labs  11/02/14 1612  NA 136  K 4.3  CO2 27  GLUCOSE 88  BUN 12.0  CREATININE 0.8  CALCIUM 9.6  PROT 8.5*  ALBUMIN 3.2*  AST 63*  ALT 29  ALKPHOS 568*  BILITOT 1.12   AFP 3.2 CEA > 10,000 CA 19-9 12,929.6  RADIOGRAPHIC STUDIES: I have personally reviewed the outside CT scan image with patient and his son.   ASSESSMENT & PLAN:  26 year old [place] male, with past history of hypertension and dilated nonischemic gammopathy with EF 25%, no clinical signs of heart failure, who was found to have hepatitis B infection lately, and multiple liver lesions on the CT scan. He has extremely high CEA and CA 19-9 levels.  1. Multiple liver lesions, highly suspicious for metastatic cancer. -I have reviewed his outside CT abdomen and pelvis scan with patient and his son in person. His liver is not cirrhotic, his AFP is normal, and his liver mass is more consistent with metastatic lesions than HCC. I think the likely primary is likely colon or  upper GI, especially with extremely high CEA and CA 19-9 levels. -I recommend him to have a PET CT scan to complete staging, and looking for primary tumor. -I recommend a liver biopsy for tissue diagnosis. -Givings the high likelihood of GI primary, I'll refer him to our GI group for endoscopy evaluation. -We discussed this is likely metastatic malignancy, unlikely curable. The treatment plan will be determined based on his type of cancer.  2. Dilated nonischemic ischemia cardiomyopathy with EF 25% -He is clinically doing well without symptoms of CHF. However this is probably going to impact his chemotherapy. I'll try to avoid cardiotoxic chemotherapy agent and avoid foods overload during chemotherapy. -Continue follow-up with cardiology.  3. Hepatitis B carrier -Per  liver clinic, no need for treatment. Follow-up with liver clinic.  Plan #1 PET/CT scan #2 liver biopsy by IR  #3 GI referral for endoscopy evaluation for primary tumor #4 return to my clinic in 3 weeks.   Orders Placed This Encounter  Procedures  . NM PET Image Initial (PI) Whole Body    Standing Status: Future     Number of Occurrences:      Standing Expiration Date: 11/02/2015    Order Specific Question:  Reason for Exam (SYMPTOM  OR DIAGNOSIS REQUIRED)    Answer:  liver lesions on CT, likely metastases    Order Specific Question:  Preferred imaging location?    Answer:  [facility]  . CBC & Diff and Retic    Standing Status: Future     Number of Occurrences: 1     Standing Expiration Date: 11/03/2015  . Comprehensive metabolic panel (Cmet) - [facility]    Standing Status: Future     Number of Occurrences: 1     Standing Expiration Date: 11/03/2015  . Protime-INR    Standing Status: Future     Number of Occurrences: 1     Standing Expiration Date: 11/03/2015  . Lactate dehydrogenase (LDH) - [facility]    Standing Status: Future     Number of Occurrences: 1     Standing Expiration Date: 11/03/2015  . Uric acid - [facility]    Standing Status: Future     Number of Occurrences: 1     Standing Expiration Date: 11/03/2015  . Ambulatory referral to Gastroenterology    Referral Priority:  Routine    Referral Type:  Consultation    Referral Reason:  Specialty Services Required    Requested Specialty:  Gastroenterology    Number of Visits Requested:  1    All questions were answered. The patient knows to call the clinic with any problems, questions or concerns. I spent 45 minutes counseling the patient face to face. The total time spent in the appointment was 60 minutes and more than 50% was on counseling.     Truitt Merle, MD 11/02/2014 10:00 PM

## 2014-11-03 ENCOUNTER — Ambulatory Visit
Admission: RE | Admit: 2014-11-03 | Discharge: 2014-11-03 | Disposition: A | Discharge status: Home / Self Care | Payer: BLUE CROSS/BLUE SHIELD | Source: Ambulatory Visit | Attending: Hematology | Admitting: Hematology

## 2014-11-03 ENCOUNTER — Other Ambulatory Visit: Payer: Self-pay | Admitting: Hematology

## 2014-11-03 DIAGNOSIS — C787 Secondary malignant neoplasm of liver and intrahepatic bile duct: Secondary | ICD-10-CM

## 2014-11-03 LAB — LACTATE DEHYDROGENASE (CC13): LDH: 624 U/L — AB (ref 125–245)

## 2014-11-03 LAB — PROTHROMBIN TIME
INR: 1 (ref <1.50)
PROTHROMBIN TIME: 13.2 s (ref 11.6–15.2)

## 2014-11-08 ENCOUNTER — Other Ambulatory Visit: Payer: Self-pay | Admitting: *Deleted

## 2014-11-09 ENCOUNTER — Other Ambulatory Visit: Payer: Self-pay | Admitting: *Deleted

## 2014-11-09 ENCOUNTER — Telehealth: Payer: Self-pay

## 2014-11-09 NOTE — Addendum Note (Signed)
Addended by: Truitt Merle on: 11/09/2014 08:56 AM   Modules accepted: Orders

## 2014-11-09 NOTE — Telephone Encounter (Signed)
-----   Message from Milus Banister, MD sent at 11/09/2014  8:13 AM EST ----- Chong Sicilian, This man was discussed at GI cancer conference today. He needs expedited referral to [place] to consider colonoscopy, EGD for liver masses, unknown primary.  Can he be seen this week or early next by any MD or extender.  Thanks

## 2014-11-09 NOTE — Telephone Encounter (Signed)
Pt has been added to Dr Henrene Pastor schedule for 11/11/14 845 am

## 2014-11-10 ENCOUNTER — Telehealth: Payer: Self-pay

## 2014-11-10 ENCOUNTER — Ambulatory Visit (HOSPITAL_COMMUNITY)
Admission: RE | Admit: 2014-11-10 | Discharge: 2014-11-10 | Disposition: A | Discharge status: Home / Self Care | Payer: BLUE CROSS/BLUE SHIELD | Source: Ambulatory Visit | Attending: Hematology | Admitting: Hematology

## 2014-11-10 DIAGNOSIS — C787 Secondary malignant neoplasm of liver and intrahepatic bile duct: Secondary | ICD-10-CM | POA: Diagnosis not present

## 2014-11-10 DIAGNOSIS — C786 Secondary malignant neoplasm of retroperitoneum and peritoneum: Secondary | ICD-10-CM | POA: Diagnosis not present

## 2014-11-10 DIAGNOSIS — C801 Malignant (primary) neoplasm, unspecified: Secondary | ICD-10-CM

## 2014-11-10 DIAGNOSIS — C187 Malignant neoplasm of sigmoid colon: Secondary | ICD-10-CM | POA: Insufficient documentation

## 2014-11-10 LAB — GLUCOSE, CAPILLARY: Glucose-Capillary: 91 mg/dL (ref 70–99)

## 2014-11-10 MED ORDER — FLUDEOXYGLUCOSE F - 18 (FDG) INJECTION
6.8000 | Freq: Once | INTRAVENOUS | Status: AC | PRN
  Administered 2014-11-10: 6.8 via INTRAVENOUS

## 2014-11-10 NOTE — Telephone Encounter (Signed)
-----   Message from Irene Shipper, MD sent at 11/10/2014 12:10 PM EST ----- Regarding: Timing of appointment Vaughan Basta, I have reviewed this patient's chart in anticipation of tomorrow's office visit. If he has not had his liver biopsy with pathology, no need for GI office appointment tomorrow. We need to demonstrate that he has a cancer in his liver that could possibly be of luminal GI origin before proceeding with GI procedures. He has never been seen here before. Please help sort this out. Thanks Dr. Henrene Pastor

## 2014-11-10 NOTE — Telephone Encounter (Signed)
Left message for pt to call back  °

## 2014-11-11 ENCOUNTER — Ambulatory Visit: Payer: BLUE CROSS/BLUE SHIELD | Admitting: Internal Medicine

## 2014-11-11 NOTE — Telephone Encounter (Signed)
Unable to reach pt prior to scheduled appt.

## 2014-11-15 ENCOUNTER — Encounter: Payer: Self-pay | Admitting: Hematology

## 2014-11-15 NOTE — Progress Notes (Signed)
Faxed office note and appeal letter to [facility] @ [place] [phone], for patient's disability

## 2014-11-17 ENCOUNTER — Other Ambulatory Visit: Payer: Self-pay | Admitting: Radiology

## 2014-11-19 ENCOUNTER — Other Ambulatory Visit: Payer: Self-pay | Admitting: Radiology

## 2014-11-20 ENCOUNTER — Other Ambulatory Visit: Payer: Self-pay | Admitting: Radiology

## 2014-11-21 ENCOUNTER — Other Ambulatory Visit: Payer: Self-pay | Admitting: *Deleted

## 2014-11-21 ENCOUNTER — Ambulatory Visit (HOSPITAL_COMMUNITY)
Admission: RE | Admit: 2014-11-21 | Discharge: 2014-11-21 | Disposition: A | Discharge status: Home / Self Care | Payer: BLUE CROSS/BLUE SHIELD | Source: Ambulatory Visit | Attending: Hematology | Admitting: Hematology

## 2014-11-21 ENCOUNTER — Telehealth: Payer: Self-pay | Admitting: *Deleted

## 2014-11-21 DIAGNOSIS — C801 Malignant (primary) neoplasm, unspecified: Secondary | ICD-10-CM

## 2014-11-21 DIAGNOSIS — C787 Secondary malignant neoplasm of liver and intrahepatic bile duct: Secondary | ICD-10-CM | POA: Diagnosis not present

## 2014-11-21 DIAGNOSIS — C799 Secondary malignant neoplasm of unspecified site: Secondary | ICD-10-CM

## 2014-11-21 LAB — PROTIME-INR
INR: 1.08 (ref 0.00–1.49)
PROTHROMBIN TIME: 14.1 s (ref 11.6–15.2)

## 2014-11-21 LAB — CBC
HCT: 38 % — ABNORMAL LOW (ref 39.0–52.0)
Hemoglobin: 12.8 g/dL — ABNORMAL LOW (ref 13.0–17.0)
MCH: 29.4 pg (ref 26.0–34.0)
MCHC: 33.7 g/dL (ref 30.0–36.0)
MCV: 87.2 fL (ref 78.0–100.0)
Platelets: 335 10*3/uL (ref 150–400)
RBC: 4.36 MIL/uL (ref 4.22–5.81)
RDW: 15.2 % (ref 11.5–15.5)
WBC: 11.4 10*3/uL — ABNORMAL HIGH (ref 4.0–10.5)

## 2014-11-21 MED ORDER — SODIUM CHLORIDE 0.9 % IV SOLN: INTRAVENOUS | Status: DC

## 2014-11-21 MED ORDER — FENTANYL CITRATE 0.05 MG/ML IJ SOLN
INTRAMUSCULAR | Status: AC | PRN
  Administered 2014-11-21: 50 ug via INTRAVENOUS

## 2014-11-21 MED ORDER — HYDROCODONE-ACETAMINOPHEN 5-325 MG PO TABS
1.0000 | ORAL_TABLET | ORAL | Status: DC | PRN
  Filled 2014-11-21: qty 2

## 2014-11-21 MED ORDER — MIDAZOLAM HCL 2 MG/2ML IJ SOLN
INTRAMUSCULAR | Status: AC | PRN
  Administered 2014-11-21: 1 mg via INTRAVENOUS

## 2014-11-21 MED ORDER — FENTANYL CITRATE 0.05 MG/ML IJ SOLN
INTRAMUSCULAR | Status: AC
  Filled 2014-11-21: qty 4

## 2014-11-21 MED ORDER — LIDOCAINE HCL (PF) 1 % IJ SOLN
INTRAMUSCULAR | Status: AC
  Filled 2014-11-21: qty 10

## 2014-11-21 MED ORDER — MIDAZOLAM HCL 2 MG/2ML IJ SOLN
INTRAMUSCULAR | Status: AC
  Filled 2014-11-21: qty 4

## 2014-11-21 NOTE — H&P (Signed)
Chief Complaint: "I'm having a liver biopsy"  Referring Physician(s): Feng,Yan  History of Present Illness: Jason Reilly is a 69 y.o. male with history of hepatitis B, elevated CEA/CA 19-9, normal AFP and recent imaging revealing findings suspicious for sigmoid colon carcinoma with associated nodal /left adrenal/pulmonary/liver metastatses. He presents today for US guided liver lesion biopsy.   Past Medical History  Diagnosis Date  . Tachycardia   . Abnormal EKG   . Hypertension   . Non-ischemic cardiomyopathy   . Hepatitis B     Past Surgical History  Procedure Laterality Date  . Nuclear stress test  03/03/2013    High risk - consistent with nonischemic cardiomyopathy  . Left and right heart catheterization with coronary angiogram N/A 03/29/2013    Procedure: LEFT AND RIGHT HEART CATHETERIZATION WITH CORONARY ANGIOGRAM;  Surgeon: Pixie Casino, MD;  Location: [facility] CATH LAB;  Service: Cardiovascular;  Laterality: N/A;    Allergies: Review of patient's allergies indicates no known allergies.  Medications: Prior to Admission medications   Medication Sig Start Date End Date Taking? Authorizing Provider  mirtazapine (REMERON) 15 MG tablet Take 1 tablet (15 mg total) by mouth at bedtime. 11/02/14  Yes Truitt Merle, MD  aspirin EC 81 MG tablet Take 1 tablet (81 mg total) by mouth daily. Patient not taking: Reported on 11/02/2014 03/09/13   Pixie Casino, MD  lisinopril-hydrochlorothiazide ([place]) 10-12.5 MG per tablet TAKE 1 TABLET BY MOUTH EVERY DAY Patient not taking: Reported on 11/02/2014 05/05/13   Pixie Casino, MD  metoprolol (LOPRESSOR) 50 MG tablet TAKE 1 TABLET BY MOUTH TWICE A DAY Patient not taking: Reported on 11/02/2014 05/05/13   Pixie Casino, MD    No family history on file.  History   Social History  . Marital Status: Unknown    Spouse Name: N/A    Number of Children: N/A  . Years of Education: N/A   Social History Main Topics  . Smoking status:  Former Research scientist (life sciences)  . Smokeless tobacco: Not on file  . Alcohol Use: Yes     Comment: occasional  . Drug Use: No  . Sexual Activity: Not on file   Other Topics Concern  . Not on file   Social History Narrative        Review of Systems  Constitutional: Positive for fatigue and unexpected weight change. Negative for fever and chills.  Respiratory: Negative for cough and shortness of breath.   Cardiovascular: Negative for chest pain.  Gastrointestinal: Positive for nausea. Negative for vomiting, abdominal pain and blood in stool.  Genitourinary: Negative for dysuria and hematuria.  Musculoskeletal: Negative for back pain.  Neurological: Negative for headaches.  Hematological: Does not bruise/bleed easily.    Vital Signs: BP 182/106 mmHg  Pulse 104  Temp(Src) 97.8 F (36.6 C) (Oral)  Resp 18  Ht 5\' 5"  (1.651 m)  Wt 138 lb (62.596 kg)  BMI 22.96 kg/m2  SpO2 100%  Physical Exam  Constitutional: He is oriented to person, place, and time. He appears well-developed and well-nourished.  Cardiovascular: Regular rhythm.   Sl tachy  Pulmonary/Chest: Effort normal and breath sounds normal.  Abdominal: Soft. Bowel sounds are normal. There is no tenderness.  hepatomegaly  Musculoskeletal: Normal range of motion. He exhibits no edema.  Neurological: He is alert and oriented to person, place, and time.    Imaging: Nm Pet Image Initial (pi) Skull Base To Thigh  11/10/2014   CLINICAL DATA:  Initial treatment strategy for evaluate  liver lesions. History of hepatitis-B.  EXAM: NUCLEAR MEDICINE PET SKULL BASE TO THIGH  TECHNIQUE: 6.8 mCi F-18 FDG was injected intravenously. Full-ring PET imaging was performed from the skull base to thigh after the radiotracer. CT data was obtained and used for attenuation correction and anatomic localization.  FASTING BLOOD GLUCOSE:  Value: 91 mg/dl  COMPARISON:  Outside CT dated 08/26/2014.  Report not submitted.  FINDINGS: NECK  No areas of abnormal  hypermetabolism.  CHEST  No areas of abnormal hypermetabolism.  ABDOMEN/PELVIS  Near complete replacement of the liver with hepatic hypermetabolic masses. Extension of the lateral segment left liver lobe (and liver masses) in the left upper quadrant, cephalad the spleen. Hypo attenuating left upper quadrant hepatic mass measures 3.5 x 2.8 cm and a S.U.V. max of 7.3 on image 106.  A partially calcified mass which replaces the majority of the medial segment left lobe and anterior segment right lobe measures 11.4 x 10.4 cm and a S.U.V. max of 9.7 on image 102.  Partially calcified left adrenal nodularity and hypermetabolism. This measures a S.U.V. max of 4.2, including on image 114.  Primary within the proximal sigmoid colon mass is identified. Wall thickening and hypermetabolism. This measures a S.U.V. max of 14.5, including on image 169.  Hypermetabolism about the penis is likely due to urinary contamination.  SKELETON  No abnormal marrow activity.  CT IMAGES PERFORMED FOR ATTENUATION CORRECTION  Cerebral atrophy.  Carotid and vertebral atherosclerosis.  Mild cardiomegaly. Multivessel coronary artery atherosclerosis. Multiple bilateral pulmonary nodules which are suspicious. Posterior right upper lobe 6 mm on image 32. 7 mm in the left lower lobe on image 48.  Aortic and branch vessel atherosclerosis. 1.6 cm low left periaortic node, not significantly hypermetabolic but suspicious based on size and minimal calcification within. Prominent nodes in the sigmoid mesocolon including on image 162. Cholelithiasis. Trace pelvic fluid.  IMPRESSION: 1. 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 Probable left adrenal and pulmonary metastasis. 2. Widespread hepatic metastasis. Favor secondary to colon primary. The liver does have a somewhat atypical morphology, favored to be related to the extent of metastatic disease. Cirrhosis with  synchronous multifocal hepatocellular carcinoma felt less likely.   Electronically Signed   By: Abigail Miyamoto M.D.   On: 11/10/2014 14:52    Labs:  CBC:  Recent Labs  11/02/14 1611 11/21/14 0840  WBC 11.4* 11.4*  HGB 12.3* 12.8*  HCT 36.9* 38.0*  PLT 339 335    COAGS:  Recent Labs  11/02/14 1611 11/21/14 0840  INR 1.00 1.08    BMP:  Recent Labs  11/02/14 1612  NA 136  K 4.3  CO2 27  GLUCOSE 88  BUN 12.0  CALCIUM 9.6  CREATININE 0.8    LIVER FUNCTION TESTS:  Recent Labs  11/02/14 1612  BILITOT 1.12  AST 63*  ALT 29  ALKPHOS 568*  PROT 8.5*  ALBUMIN 3.2*    TUMOR MARKERS: No results for input(s): AFPTM, CEA, CA199, CHROMGRNA in the last 8760 hours.  Assessment and Plan: Kara Mierzejewski is a 69 y.o. male with history of hepatitis B, elevated CEA/CA 19-9, normal AFP and recent imaging revealing findings suspicious for sigmoid colon carcinoma with associated nodal /left adrenal/pulmonary/liver metastatses. He presents today for US guided liver lesion biopsy. Details/risks of procedure d/w pt via interpreter with his understanding and consent.   Signed: Autumn Messing 11/21/2014, 9:11 AM

## 2014-11-21 NOTE — Telephone Encounter (Signed)
Called IR to schedule port placement ASAP per Dr Burr Medico.  Tiffany/Scheduler reports that she will call pt to schedule.

## 2014-11-21 NOTE — Procedures (Signed)
[place] core bx liver met 18g x3 to surg path No complication No blood loss. See complete dictation in [facility].

## 2014-11-21 NOTE — Discharge Instructions (Signed)
Liver Biopsy, Care After °These instructions give you information on caring for yourself after your procedure. Your doctor may also give you more specific instructions. Call your doctor if you have any problems or questions after your procedure. °HOME CARE °· Rest at home for 1-2 days or as told by your doctor. °· Have someone stay with you for at least 24 hours. °· Do not do these things in the first 24 hours: °¨ Drive. °¨ Use machinery. °¨ Take care of other people. °¨ Sign legal documents. °¨ Take a bath or shower. °· There are many different ways to close and cover a cut (incision). For example, a cut can be closed with stitches, skin glue, or adhesive strips. Follow your doctor's instructions on: °¨ Taking care of your cut. °¨ Changing and removing your bandage (dressing). °¨ Removing whatever was used to close your cut. °· Do not drink alcohol in the first week. °· Do not lift more than 5 pounds or play contact sports for the first 2 weeks. °· Take medicines only as told by your doctor. For 1 week, do not take medicine that has aspirin in it or medicines like ibuprofen. °· Get your test results. °GET HELP IF: °· A cut bleeds and leaves more than just a small spot of blood. °· A cut is red, puffs up (swells), or hurts more than before. °· Fluid or something else comes from a cut. °· A cut smells bad. °· You have a fever or chills. °GET HELP RIGHT AWAY IF: °· You have swelling, bloating, or pain in your belly (abdomen). °· You get dizzy or faint. °· You have a rash. °· You feel sick to your stomach (nauseous) or throw up (vomit). °· You have trouble breathing, feel short of breath, or feel faint. °· Your chest hurts. °· You have problems talking or seeing. °· You have trouble balancing or moving your arms or legs. °Document Released: 07/23/2008 Document Revised: 02/28/2014 Document Reviewed: 12/10/2013 °ExitCare® Patient Information ©2015 [employer]. This information is not intended to replace advice given to  you by your health care provider. Make sure you discuss any questions you have with your health care provider. ° °

## 2014-11-21 NOTE — Sedation Documentation (Signed)
Patient holding for procedure.  Waiting on MD

## 2014-11-22 ENCOUNTER — Encounter: Payer: Self-pay | Admitting: Hematology

## 2014-11-22 ENCOUNTER — Other Ambulatory Visit (HOSPITAL_BASED_OUTPATIENT_CLINIC_OR_DEPARTMENT_OTHER): Payer: BLUE CROSS/BLUE SHIELD

## 2014-11-22 ENCOUNTER — Telehealth: Payer: Self-pay | Admitting: Hematology

## 2014-11-22 ENCOUNTER — Ambulatory Visit (HOSPITAL_BASED_OUTPATIENT_CLINIC_OR_DEPARTMENT_OTHER): Payer: BLUE CROSS/BLUE SHIELD | Admitting: Hematology

## 2014-11-22 ENCOUNTER — Other Ambulatory Visit: Payer: Self-pay | Admitting: Radiology

## 2014-11-22 VITALS — BP 171/83 | HR 97 | Temp 98.5°F | Resp 18 | Ht 65.0 in | Wt 140.5 lb

## 2014-11-22 DIAGNOSIS — C787 Secondary malignant neoplasm of liver and intrahepatic bile duct: Principal | ICD-10-CM

## 2014-11-22 DIAGNOSIS — C189 Malignant neoplasm of colon, unspecified: Secondary | ICD-10-CM

## 2014-11-22 DIAGNOSIS — R5383 Other fatigue: Secondary | ICD-10-CM

## 2014-11-22 DIAGNOSIS — C7972 Secondary malignant neoplasm of left adrenal gland: Secondary | ICD-10-CM

## 2014-11-22 DIAGNOSIS — C7800 Secondary malignant neoplasm of unspecified lung: Secondary | ICD-10-CM

## 2014-11-22 DIAGNOSIS — R634 Abnormal weight loss: Secondary | ICD-10-CM

## 2014-11-22 DIAGNOSIS — C187 Malignant neoplasm of sigmoid colon: Secondary | ICD-10-CM | POA: Insufficient documentation

## 2014-11-22 LAB — CBC WITH DIFFERENTIAL/PLATELET
BASO%: 0.4 % (ref 0.0–2.0)
Basophils Absolute: 0 10*3/uL (ref 0.0–0.1)
EOS ABS: 0.3 10*3/uL (ref 0.0–0.5)
EOS%: 2.4 % (ref 0.0–7.0)
HEMATOCRIT: 35.1 % — AB (ref 38.4–49.9)
HEMOGLOBIN: 11.6 g/dL — AB (ref 13.0–17.1)
LYMPH#: 2.3 10*3/uL (ref 0.9–3.3)
LYMPH%: 21.4 % (ref 14.0–49.0)
MCH: 28.7 pg (ref 27.2–33.4)
MCHC: 33 g/dL (ref 32.0–36.0)
MCV: 86.9 fL (ref 79.3–98.0)
MONO#: 0.9 10*3/uL (ref 0.1–0.9)
MONO%: 8.3 % (ref 0.0–14.0)
NEUT#: 7.1 10*3/uL — ABNORMAL HIGH (ref 1.5–6.5)
NEUT%: 67.5 % (ref 39.0–75.0)
Platelets: 296 10*3/uL (ref 140–400)
RBC: 4.04 10*6/uL — ABNORMAL LOW (ref 4.20–5.82)
RDW: 15.5 % — AB (ref 11.0–14.6)
WBC: 10.5 10*3/uL — ABNORMAL HIGH (ref 4.0–10.3)

## 2014-11-22 LAB — COMPREHENSIVE METABOLIC PANEL (CC13)
ALBUMIN: 2.9 g/dL — AB (ref 3.5–5.0)
ALT: 29 U/L (ref 0–55)
AST: 74 U/L — ABNORMAL HIGH (ref 5–34)
Alkaline Phosphatase: 581 U/L — ABNORMAL HIGH (ref 40–150)
Anion Gap: 12 mEq/L — ABNORMAL HIGH (ref 3–11)
BILIRUBIN TOTAL: 1.35 mg/dL — AB (ref 0.20–1.20)
BUN: 5.6 mg/dL — ABNORMAL LOW (ref 7.0–26.0)
CO2: 25 mEq/L (ref 22–29)
Calcium: 8.9 mg/dL (ref 8.4–10.4)
Chloride: 102 mEq/L (ref 98–109)
Creatinine: 0.8 mg/dL (ref 0.7–1.3)
Glucose: 155 mg/dl — ABNORMAL HIGH (ref 70–140)
Potassium: 3.2 mEq/L — ABNORMAL LOW (ref 3.5–5.1)
Sodium: 139 mEq/L (ref 136–145)
TOTAL PROTEIN: 8.2 g/dL (ref 6.4–8.3)

## 2014-11-22 MED ORDER — ONDANSETRON HCL 8 MG PO TABS: 8.0000 mg | ORAL_TABLET | Freq: Three times a day (TID) | ORAL | Status: DC | PRN

## 2014-11-22 NOTE — Telephone Encounter (Signed)
gv and printed appt sched and avs for pt for Jan adn Feb...sed added tx...emailed MW to add tx.

## 2014-11-22 NOTE — Progress Notes (Signed)
[facility]  [phone] [phone]) [phone]  Clinic New Consult Note   Patient Care Team: [name], MD as PCP - General (Specialist) [name], CRNP as Nurse Practitioner (Nurse Practitioner) 11/22/2014  CHIEF COMPLAINTS Metastatic colon cancer    Metastatic colon cancer to liver   10/28/2014 Tumor Marker AFP 3.2 CEA > 10,000 CA 19-9 12,929.6   11/10/2014 Imaging PET scan showed hypermetabolic mass in the sigmoid colon was noted metastasis in the retroperitoneum. Probable left adrenal and pulmonary metastasis, and diffuse liver metastasis.   11/21/2014 Initial Diagnosis Metastatic colon cancer to liver, lung, abd nodes and left adrenal gland. Diagnosis was made by liver biopsy.      HISTORY OF PRESENTING ILLNESS:  Jason Reilly 69 y.o. male is here because of abnormal CT findings, which is very suspicious for malignancy. He is on ranitidine from [place], has been on in the [place] for 16 years. He came in with his son and an interpreter.  He has been feeling fatigued since two month ago. He is still able to do all ADLs. He otherwise denies any pain, bloating or nausea.  He lost about 20lbs in 3 month. His appetite is lower than before, eats less, no change of his bowl habits.  She denied any hematochezia or melana. Per his son, he has had some personality changes daily, irritable, slightly confused some time.  He was evaluated by his primary care physician. Lab test reviewed hepatitis B infection, which he did not know before, and elevated alkaline phosphatase, his liver function and the rest of the liver function was not remarkable. [place] of abdomen was obtained on 07/22/2014, which showed diffusely abnormal liver with multiple echogenic lesions. CT of abdomen with and without contrast was done on 08/26/2014, which reviewed here at a medically with multiple large partially calcified hepatic masses consistent with metastatic disease. Mild retroperitoneal adenopathy  with the largest node measuring 1.6 cm. And nonspecific 1.4 cm left adrenal nodule was also noticed. His tumor marker showed CEA greater than 10,000, CA 19-9 12,929, AFP 3.2 (normal). He was referred to [facility] and was evaluated by nurse practitioner [name]. Treatment for hepatitis B was not recommended based on his virus load.  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 He was evaluated by a cardiologist in 2014. He denies any significant dyspnea on exertion. No leg swollen.  INTERIM HISTORY: Jason Reilly returns for follow-up. His appetite and sleep has improved since he started mirtazapine. He has been off his hypertension medication since his last visit with me, and his blood pressure was pretty high on his office visit today. He denies any chest pain, abdominal discomfort, headaches, dyspnea, or any other new symptoms.  MEDICAL HISTORY:  Past Medical History  Diagnosis Date  . Tachycardia   . Abnormal EKG   . Hypertension   . Non-ischemic cardiomyopathy   . Hepatitis B     SURGICAL HISTORY: Past Surgical History  Procedure Laterality Date  . Nuclear stress test  03/03/2013    High risk - consistent with nonischemic cardiomyopathy  . Left and right heart catheterization with coronary angiogram N/A 03/29/2013    Procedure: LEFT AND RIGHT HEART CATHETERIZATION WITH CORONARY ANGIOGRAM;  Surgeon: Pixie Casino, MD;  Location: [facility] CATH LAB;  Service: Cardiovascular;  Laterality: N/A;    SOCIAL HISTORY: History   Social History  . Marital Status: Unknown    Spouse Name: N/A    Number of  Children: N/A  . Years of Education: N/A   Occupational History  . Not on file.   Social History Main Topics  . Smoking status: Former Research scientist (life sciences)  . Smokeless tobacco: Not on file  . Alcohol Use: Yes     Comment: occasional  . Drug Use: No  . Sexual Activity: Not on file   Other Topics Concern  . Not on file   Social  History Narrative    FAMILY HISTORY: No family history of liver disease or malignancy.  ALLERGIES:  has No Known Allergies.  MEDICATIONS:  Current Outpatient Prescriptions  Medication Sig Dispense Refill  . aspirin EC 81 MG tablet Take 1 tablet (81 mg total) by mouth daily. (Patient not taking: Reported on 11/02/2014) 90 tablet 3  . lisinopril-hydrochlorothiazide (PRINZIDE,ZESTORETIC) 10-12.5 MG per tablet TAKE 1 TABLET BY MOUTH EVERY DAY (Patient not taking: Reported on 11/02/2014) 90 tablet 3  . metoprolol (LOPRESSOR) 50 MG tablet TAKE 1 TABLET BY MOUTH TWICE A DAY (Patient not taking: Reported on 11/02/2014) 180 tablet 3  . metoprolol succinate (TOPROL-XL) 50 MG 24 hr tablet Take 50 mg by mouth 2 (two) times daily. Take with or immediately following a meal.    .        No current facility-administered medications for this visit.    REVIEW OF SYSTEMS:   Constitutional: Denies fevers, chills or abnormal night sweats, (+) fatigue  Eyes: Denies blurriness of vision, double vision or watery eyes Ears, nose, mouth, throat, and face: Denies mucositis or sore throat Respiratory: Denies cough, dyspnea or wheezes Cardiovascular: Denies palpitation, chest discomfort or lower extremity swelling Gastrointestinal: (+) anorexia.   Denies nausea, heartburn or change in bowel habits Skin: Denies abnormal skin rashes Lymphatics: Denies new lymphadenopathy or easy bruising Neurological:Denies numbness, tingling or new weaknesses Behavioral/Psych: Mood is stable, no new changes, (+) insomnia All other systems were reviewed with the patient and are negative.  PHYSICAL EXAMINATION: ECOG PERFORMANCE STATUS: 1 - Symptomatic but completely ambulatory  Filed Vitals:   11/22/14 1555  BP: 171/83  Pulse:   Temp:   Resp:    Filed Weights   11/22/14 1554  Weight: 140 lb 8 oz (63.73 kg)    GENERAL:alert, no distress and comfortable SKIN: skin color, texture, turgor are normal, (+) several large  papular skin rash on the neck and face, no other rashes or significant lesions EYES: normal, conjunctiva are pink and non-injected, sclera clear OROPHARYNX:no exudate, no erythema and lips, buccal mucosa, and tongue normal  NECK: supple, thyroid normal size, non-tender, without nodularity LYMPH:  no palpable lymphadenopathy in the cervical, axillary or inguinal LUNGS: clear to auscultation and percussion with normal breathing effort HEART: regular rate & rhythm and no murmurs and no lower extremity edema ABDOMEN:abdomen soft, non-tender, (+) hepatomegaly, liver is palpable 3.5 cm under rib cage, soft nontender, no splenomegaly and normal bowel sounds Musculoskeletal:no cyanosis of digits and no clubbing  PSYCH: alert & oriented x 3 with fluent speech NEURO: no focal motor/sensory deficits  LABORATORY DATA:  I have reviewed the data as listed Lab Results  Component Value Date   WBC 10.5* 11/22/2014   HGB 11.6* 11/22/2014   HCT 35.1* 11/22/2014   MCV 86.9 11/22/2014   PLT 296 11/22/2014    Recent Labs  11/02/14 1612 11/22/14 1424  NA 136 139  K 4.3 3.2*  CO2 27 25  GLUCOSE 88 155*  BUN 12.0 5.6*  CREATININE 0.8 0.8  CALCIUM 9.6 8.9  PROT 8.5* 8.2  ALBUMIN 3.2* 2.9*  AST 63* 74*  ALT 29 29  ALKPHOS 568* 581*  BILITOT 1.12 1.35*   AFP 3.2 CEA > 10,000 CA 19-9 12,929.6  RADIOGRAPHIC STUDIES: I have personally reviewed the outside CT scan image with patient and his son.   PET 11/10/2014 IMPRESSION: 1. 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 Probable left adrenal and pulmonary metastasis. 2. Widespread hepatic metastasis. Favor secondary to colon primary. The liver does have a somewhat atypical morphology, favored to be related to the extent of metastatic disease. Cirrhosis with synchronous multifocal hepatocellular carcinoma felt less likely.   ASSESSMENT & PLAN:    69 year old [place] male, with past history of hypertension and dilated nonischemic gammopathy with EF 25%, no clinical signs of heart failure, who was found to have hepatitis B infection lately, and multiple liver lesions on the CT scan. He has extremely high CEA and CA 19-9 levels.  1. Metastatic sigmoid colon cancer, with diffuse liver, lungs, node and left adrenal gland metastases.  -I have discussed with pathologist Dr. Donato Heinz today. The liver biopsy far G was consistent with adenocarcinoma, favor colon primary. Immunostains will be done tonight, KRAS/NRAS will be done also. -I discussed the PET scan findings with patient and his son through the interpreter. Unfortunately, this is incurable, and he has very high disease burden.  -He has preserved PS and organ function, would be a candidate for systemic chemotherapy. I recommended FOLFOX, starting next week. He has port placement scheduled for tomorrow. -If his K-ras and NRAS are wild type, I would consider added on EGFR inhibitor Panitumumab to the first-line chemotherapy.  -If his tumor has K-ras or NRAS mutation, I'll add Avastin to the first-line chemotherapy -He agrees with the recommendations, we'll schedule him to start next week. -Chemotherapy class this week.  2. HTN, Dilated nonischemic ischemia cardiomyopathy with EF 25% -He is clinically doing well without symptoms of CHF. However this is probably going to impact his chemotherapy. I'll try to avoid cardiotoxic chemotherapy agent and avoid fluid overload during chemotherapy. -He will resume his cardiac meds today  -Continue follow-up with cardiology.  3. Hepatitis B carrier -Per liver clinic, no need for treatment. Follow-up with liver clinic.  Plan -Chemotherapy class -Starting chemotherapy next Wednesday -Port placements tomorrow -I'll see him back in 2 weeks.   Orders Placed This Encounter  Procedures  . CBC with Differential    Standing Status: Standing     Number of  Occurrences: 20     Standing Expiration Date: 11/23/2015  . Comprehensive metabolic panel (Cmet) - [facility]    Standing Status: Standing     Number of Occurrences: 20     Standing Expiration Date: 11/23/2015    All questions were answered. The patient knows to call the clinic with any problems, questions or concerns. I spent 45 minutes counseling the patient face to face. The total time spent in the appointment was 60 minutes and more than 50% was on counseling.     Truitt Merle, MD 11/22/2014 6:18 PM

## 2014-11-23 ENCOUNTER — Ambulatory Visit (HOSPITAL_COMMUNITY)
Admission: RE | Admit: 2014-11-23 | Discharge: 2014-11-23 | Disposition: A | Discharge status: Home / Self Care | Payer: BLUE CROSS/BLUE SHIELD | Source: Ambulatory Visit | Attending: Hematology | Admitting: Hematology

## 2014-11-23 ENCOUNTER — Telehealth: Payer: Self-pay | Admitting: Hematology

## 2014-11-23 ENCOUNTER — Other Ambulatory Visit: Payer: Self-pay | Admitting: Hematology

## 2014-11-23 ENCOUNTER — Telehealth: Payer: Self-pay | Admitting: *Deleted

## 2014-11-23 ENCOUNTER — Encounter (HOSPITAL_COMMUNITY): Payer: Self-pay

## 2014-11-23 DIAGNOSIS — C189 Malignant neoplasm of colon, unspecified: Secondary | ICD-10-CM | POA: Insufficient documentation

## 2014-11-23 DIAGNOSIS — C799 Secondary malignant neoplasm of unspecified site: Secondary | ICD-10-CM

## 2014-11-23 DIAGNOSIS — I429 Cardiomyopathy, unspecified: Secondary | ICD-10-CM | POA: Diagnosis not present

## 2014-11-23 DIAGNOSIS — I10 Essential (primary) hypertension: Secondary | ICD-10-CM | POA: Diagnosis not present

## 2014-11-23 DIAGNOSIS — Z7982 Long term (current) use of aspirin: Secondary | ICD-10-CM | POA: Diagnosis not present

## 2014-11-23 DIAGNOSIS — Z87891 Personal history of nicotine dependence: Secondary | ICD-10-CM | POA: Insufficient documentation

## 2014-11-23 LAB — CBC WITH DIFFERENTIAL/PLATELET
Basophils Absolute: 0 K/uL (ref 0.0–0.1)
Basophils Relative: 0 % (ref 0–1)
Eosinophils Absolute: 0.2 K/uL (ref 0.0–0.7)
Eosinophils Relative: 2 % (ref 0–5)
HCT: 35.2 % — ABNORMAL LOW (ref 39.0–52.0)
Hemoglobin: 11.7 g/dL — ABNORMAL LOW (ref 13.0–17.0)
Lymphocytes Relative: 14 % (ref 12–46)
Lymphs Abs: 1.5 K/uL (ref 0.7–4.0)
MCH: 29 pg (ref 26.0–34.0)
MCHC: 33.2 g/dL (ref 30.0–36.0)
MCV: 87.3 fL (ref 78.0–100.0)
Monocytes Absolute: 1.1 K/uL — ABNORMAL HIGH (ref 0.1–1.0)
Monocytes Relative: 10 % (ref 3–12)
Neutro Abs: 8.1 K/uL — ABNORMAL HIGH (ref 1.7–7.7)
Neutrophils Relative %: 74 % (ref 43–77)
Platelets: 326 K/uL (ref 150–400)
RBC: 4.03 MIL/uL — ABNORMAL LOW (ref 4.22–5.81)
RDW: 15.7 % — ABNORMAL HIGH (ref 11.5–15.5)
WBC: 10.9 K/uL — ABNORMAL HIGH (ref 4.0–10.5)

## 2014-11-23 MED ORDER — SODIUM CHLORIDE 0.9 % IV SOLN
INTRAVENOUS | Status: DC
  Administered 2014-11-23: 08:00:00 via INTRAVENOUS

## 2014-11-23 MED ORDER — LIDOCAINE HCL 1 % IJ SOLN
INTRAMUSCULAR | Status: AC
  Filled 2014-11-23: qty 20

## 2014-11-23 MED ORDER — FENTANYL CITRATE 0.05 MG/ML IJ SOLN
INTRAMUSCULAR | Status: AC
  Filled 2014-11-23: qty 4

## 2014-11-23 MED ORDER — HEPARIN SOD (PORK) LOCK FLUSH 100 UNIT/ML IV SOLN
INTRAVENOUS | Status: AC
  Filled 2014-11-23: qty 5

## 2014-11-23 MED ORDER — MIDAZOLAM HCL 2 MG/2ML IJ SOLN
INTRAMUSCULAR | Status: AC | PRN
  Administered 2014-11-23: 0.5 mg via INTRAVENOUS
  Administered 2014-11-23: 1 mg via INTRAVENOUS
  Administered 2014-11-23: 0.5 mg via INTRAVENOUS

## 2014-11-23 MED ORDER — CEFAZOLIN SODIUM-DEXTROSE 2-3 GM-% IV SOLR
INTRAVENOUS | Status: AC
  Filled 2014-11-23: qty 50

## 2014-11-23 MED ORDER — FENTANYL CITRATE 0.05 MG/ML IJ SOLN
INTRAMUSCULAR | Status: AC | PRN
  Administered 2014-11-23 (×2): 25 ug via INTRAVENOUS
  Administered 2014-11-23: 50 ug via INTRAVENOUS

## 2014-11-23 MED ORDER — HEPARIN SOD (PORK) LOCK FLUSH 100 UNIT/ML IV SOLN
INTRAVENOUS | Status: AC | PRN
  Administered 2014-11-23: 500 [IU]

## 2014-11-23 MED ORDER — LIDOCAINE-EPINEPHRINE 2 %-1:100000 IJ SOLN
INTRAMUSCULAR | Status: AC
  Filled 2014-11-23: qty 1

## 2014-11-23 MED ORDER — MIDAZOLAM HCL 2 MG/2ML IJ SOLN
INTRAMUSCULAR | Status: AC
  Filled 2014-11-23: qty 6

## 2014-11-23 MED ORDER — CEFAZOLIN SODIUM-DEXTROSE 2-3 GM-% IV SOLR
2.0000 g | INTRAVENOUS | Status: AC
  Administered 2014-11-23: 2 g via INTRAVENOUS

## 2014-11-23 NOTE — H&P (Signed)
Chief Complaint: "I am here for a port."  Referring Physician(s): Feng,Yan  History of Present Illness: Jason Reilly is a 69 y.o. male with metastatic colon cancer scheduled today for a port a catheter. The patient has had a H&P performed within the last 30 days, all history, medications, and exam have been reviewed. The patient denies any interval changes since the H&P. He has previously tolerated sedation during his liver biopsy. He denies any fever or chills.   Past Medical History  Diagnosis Date  . Tachycardia   . Abnormal EKG   . Hypertension   . Non-ischemic cardiomyopathy   . Hepatitis B     Past Surgical History  Procedure Laterality Date  . Nuclear stress test  03/03/2013    High risk - consistent with nonischemic cardiomyopathy  . Left and right heart catheterization with coronary angiogram N/A 03/29/2013    Procedure: LEFT AND RIGHT HEART CATHETERIZATION WITH CORONARY ANGIOGRAM;  Surgeon: Pixie Casino, MD;  Location: [facility] CATH LAB;  Service: Cardiovascular;  Laterality: N/A;    Allergies: Review of patient's allergies indicates no known allergies.  Medications: Prior to Admission medications   Medication Sig Start Date End Date Taking? Authorizing Provider  metoprolol-hydrochlorothiazide (LOPRESSOR HCT) 100-25 MG per tablet Take 1 tablet by mouth daily.   Yes Historical Provider, MD  mirtazapine (REMERON) 15 MG tablet Take 1 tablet (15 mg total) by mouth at bedtime. 11/02/14  Yes Truitt Merle, MD  aspirin EC 81 MG tablet Take 1 tablet (81 mg total) by mouth daily. Patient not taking: Reported on 11/02/2014 03/09/13   Pixie Casino, MD  lisinopril-hydrochlorothiazide (PRINZIDE,ZESTORETIC) 10-12.5 MG per tablet TAKE 1 TABLET BY MOUTH EVERY DAY Patient not taking: Reported on 11/02/2014 05/05/13   Pixie Casino, MD  metoprolol (LOPRESSOR) 50 MG tablet TAKE 1 TABLET BY MOUTH TWICE A DAY Patient not taking: Reported on 11/02/2014 05/05/13   Pixie Casino, MD  ondansetron  (ZOFRAN) 8 MG tablet Take 1 tablet (8 mg total) by mouth every 8 (eight) hours as needed for nausea or vomiting. 11/22/14   Truitt Merle, MD    History reviewed. No pertinent family history.  History   Social History  . Marital Status: Unknown    Spouse Name: N/A    Number of Children: N/A  . Years of Education: N/A   Social History Main Topics  . Smoking status: Former Research scientist (life sciences)  . Smokeless tobacco: None  . Alcohol Use: Yes     Comment: occasional  . Drug Use: No  . Sexual Activity: None   Other Topics Concern  . None   Social History Narrative    Review of Systems: A 12 point ROS discussed and pertinent positives are indicated in the HPI above.  All other systems are negative.  Review of Systems  Vital Signs: BP 171/96 mmHg  Pulse 70  Temp(Src) 97.9 F (36.6 C) (Oral)  Resp 18  SpO2 99%  Physical Exam  Constitutional: He is oriented to person, place, and time. No distress.  HENT:  Head: Normocephalic and atraumatic.  Neck: No tracheal deviation present.  Cardiovascular: Normal rate and regular rhythm.  Exam reveals no gallop and no friction rub.   No murmur heard. Pulmonary/Chest: Effort normal and breath sounds normal. No respiratory distress. He has no wheezes. He has no rales.  Abdominal: Soft. Bowel sounds are normal. He exhibits no distension. There is no tenderness.  Neurological: He is alert and oriented to person, place, and time.  Skin: He is not diaphoretic.    Imaging: Nm Pet Image Initial (pi) Skull Base To Thigh  11/10/2014   CLINICAL DATA:  Initial treatment strategy for evaluate liver lesions. History of hepatitis-B.  EXAM: NUCLEAR MEDICINE PET SKULL BASE TO THIGH  TECHNIQUE: 6.8 mCi F-18 FDG was injected intravenously. Full-ring PET imaging was performed from the skull base to thigh after the radiotracer. CT data was obtained and used for attenuation correction and anatomic localization.  FASTING BLOOD GLUCOSE:  Value: 91 mg/dl  COMPARISON:  Outside CT  dated 08/26/2014.  Report not submitted.  FINDINGS: NECK  No areas of abnormal hypermetabolism.  CHEST  No areas of abnormal hypermetabolism.  ABDOMEN/PELVIS  Near complete replacement of the liver with hepatic hypermetabolic masses. Extension of the lateral segment left liver lobe (and liver masses) in the left upper quadrant, cephalad the spleen. Hypo attenuating left upper quadrant hepatic mass measures 3.5 x 2.8 cm and a S.U.V. max of 7.3 on image 106.  A partially calcified mass which replaces the majority of the medial segment left lobe and anterior segment right lobe measures 11.4 x 10.4 cm and a S.U.V. max of 9.7 on image 102.  Partially calcified left adrenal nodularity and hypermetabolism. This measures a S.U.V. max of 4.2, including on image 114.  Primary within the proximal sigmoid colon mass is identified. Wall thickening and hypermetabolism. This measures a S.U.V. max of 14.5, including on image 169.  Hypermetabolism about the penis is likely due to urinary contamination.  SKELETON  No abnormal marrow activity.  CT IMAGES PERFORMED FOR ATTENUATION CORRECTION  Cerebral atrophy.  Carotid and vertebral atherosclerosis.  Mild cardiomegaly. Multivessel coronary artery atherosclerosis. Multiple bilateral pulmonary nodules which are suspicious. Posterior right upper lobe 6 mm on image 32. 7 mm in the left lower lobe on image 48.  Aortic and branch vessel atherosclerosis. 1.6 cm low left periaortic node, not significantly hypermetabolic but suspicious based on size and minimal calcification within. Prominent nodes in the sigmoid mesocolon including on image 162. Cholelithiasis. Trace pelvic fluid.  IMPRESSION: 1. 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 Probable left adrenal and pulmonary metastasis. 2. Widespread hepatic metastasis. Favor secondary to colon primary. The liver does have a somewhat atypical morphology,  favored to be related to the extent of metastatic disease. Cirrhosis with synchronous multifocal hepatocellular carcinoma felt less likely.   Electronically Signed   By: Abigail Miyamoto M.D.   On: 11/10/2014 14:52   US Biopsy  11/21/2014   CLINICAL DATA:  Sigmoid colon lesion.  Multiple liver metastases.  EXAM: ULTRASOUND-GUIDED CORE LIVER BIOPSY  TECHNIQUE: An ultrasound guided liver biopsy was thoroughly discussed with the patient and questions were answered with the aid of a translator. The benefits, risks, alternatives, and complications were also discussed. The patient understands and wishes to proceed with the procedure. A verbal as well as written consent was obtained.  Survey ultrasound of the liver was performed, a representative lesion localized, and an appropriate skin entry site was determined. Skin site was marked, prepped with Betadine, and draped in usual sterile fashion, and infiltrated locally with 1% lidocaine.  Intravenous Fentanyl and Versed were administered as conscious sedation during continuous cardiorespiratory monitoring by the radiology RN, with a total moderate sedation time of 9 minutes.  A 17 gauge trocar needle was advanced under ultrasound guidance into the liver to the margin of the lesion. 3 with coaxial 18gauge core samples were then obtained  through the guide needle. The guide needle was removed. Post procedure scans demonstrate no apparent complication.  COMPLICATIONS: COMPLICATIONS None immediate  FINDINGS: Large hepatic lesions corresponding to those seen on previous PET-CT were again localized. Ultrasound-guided percutaneous core biopsy samples obtained.  IMPRESSION: 1. Technically successful ultrasound guided core liver lesion biopsy.   Electronically Signed   By: Arne Cleveland M.D.   On: 11/21/2014 13:02    Labs:  CBC:  Recent Labs  11/02/14 1611 11/21/14 0840 11/22/14 1423 11/23/14 0820  WBC 11.4* 11.4* 10.5* 10.9*  HGB 12.3* 12.8* 11.6* 11.7*  HCT 36.9*  38.0* 35.1* 35.2*  PLT 339 335 296 326    COAGS:  Recent Labs  11/02/14 1611 11/21/14 0840  INR 1.00 1.08    BMP:  Recent Labs  11/02/14 1612 11/22/14 1424  NA 136 139  K 4.3 3.2*  CO2 27 25  GLUCOSE 88 155*  BUN 12.0 5.6*  CALCIUM 9.6 8.9  CREATININE 0.8 0.8    LIVER FUNCTION TESTS:  Recent Labs  11/02/14 1612 11/22/14 1424  BILITOT 1.12 1.35*  AST 63* 74*  ALT 29 29  ALKPHOS 568* 581*  PROT 8.5* 8.2  ALBUMIN 3.2* 2.9*   Assessment and Plan: Metastatic colon cancer Seen by [name] in the office on 11/22/14 Scheduled today for image guided port a catheter placement with moderate sedation Patient has been NPO, afebrile, no blood thinners taken, labs reviewed Leukocytosis likely secondary to tumor burden, afebrile, no use of steroids.  Risks and Benefits discussed with the patient with the use of interpreter. All of the patient's questions were answered, patient is agreeable to proceed. Consent signed and in chart. Dilated NICM EF 25% Hepatitis B carrier   Signed: Hedy Jacob 11/23/2014, 8:42 AM

## 2014-11-23 NOTE — Procedures (Signed)
Interventional Radiology Procedure Note  Procedure: Placement of a right IJ approach single lumen PowerPort.  Tip is positioned at the superior cavoatrial junction and catheter is ready for immediate use.  Complications: No immediate Recommendations:  - Ok to shower tomorrow - Do not submerge for 7 days - Routine line care   Signed,  Heath K. McCullough, MD   

## 2014-11-23 NOTE — Telephone Encounter (Signed)
gv and pritned pt new appt sched for Feb..Marland KitchenMarland KitchenMarland Kitchenpt ok and aware of changes

## 2014-11-23 NOTE — Telephone Encounter (Signed)
Per staff message and POF I have scheduled appts. Advised scheduler of appts. JMW  

## 2014-11-23 NOTE — Discharge Instructions (Signed)
Conscious Sedation, Adult, Care After °Refer to this sheet in the next few weeks. These instructions provide you with information on caring for yourself after your procedure. Your health care provider may also give you more specific instructions. Your treatment has been planned according to current medical practices, but problems sometimes occur. Call your health care provider if you have any problems or questions after your procedure. °WHAT TO EXPECT AFTER THE PROCEDURE  °After your procedure: °· You may feel sleepy, clumsy, and have poor balance for several hours. °· Vomiting may occur if you eat too soon after the procedure. °HOME CARE INSTRUCTIONS °· Do not participate in any activities where you could become injured for at least 24 hours. Do not: °¨ Drive. °¨ Swim. °¨ Ride a bicycle. °¨ Operate heavy machinery. °¨ Cook. °¨ Use power tools. °¨ Climb ladders. °¨ Work from a high place. °· Do not make important decisions or sign legal documents until you are improved. °· If you vomit, drink water, juice, or soup when you can drink without vomiting. Make sure you have little or no nausea before eating solid foods. °· Only take over-the-counter or prescription medicines for pain, discomfort, or fever as directed by your health care provider. °· Make sure you and your family fully understand everything about the medicines given to you, including what side effects may occur. °· You should not drink alcohol, take sleeping pills, or take medicines that cause drowsiness for at least 24 hours. °· If you smoke, do not smoke without supervision. °· If you are feeling better, you may resume normal activities 24 hours after you were sedated. °· Keep all appointments with your health care provider. °SEEK MEDICAL CARE IF: °· Your skin is pale or bluish in color. °· You continue to feel nauseous or vomit. °· Your pain is getting worse and is not helped by medicine. °· You have bleeding or swelling. °· You are still sleepy or  feeling clumsy after 24 hours. °SEEK IMMEDIATE MEDICAL CARE IF: °· You develop a rash. °· You have difficulty breathing. °· You develop any type of allergic problem. °· You have a fever. °MAKE SURE YOU: °· Understand these instructions. °· Will watch your condition. °· Will get help right away if you are not doing well or get worse. °Document Released: 08/04/2013 Document Reviewed: 08/04/2013 °ExitCare® Patient Information ©2015 [employer]. This information is not intended to replace advice given to you by your health care provider. Make sure you discuss any questions you have with your health care provider. °Implanted Port Home Guide °An implanted port is a type of central line that is placed under the skin. Central lines are used to provide IV access when treatment or nutrition needs to be given through a person's veins. Implanted ports are used for long-term IV access. An implanted port may be placed because:  °· You need IV medicine that would be irritating to the small veins in your hands or arms.   °· You need long-term IV medicines, such as antibiotics.   °· You need IV nutrition for a long period.   °· You need frequent blood draws for lab tests.   °· You need dialysis.   °Implanted ports are usually placed in the chest area, but they can also be placed in the upper arm, the abdomen, or the leg. An implanted port has two main parts:  °· Reservoir. The reservoir is round and will appear as a small, raised area under your skin. The reservoir is the part where a   needle is inserted to give medicines or draw blood.   °· Catheter. The catheter is a thin, flexible tube that extends from the reservoir. The catheter is placed into a large vein. Medicine that is inserted into the reservoir goes into the catheter and then into the vein.   °HOW WILL I CARE FOR MY INCISION SITE? °Do not get the incision site wet. Bathe or shower as directed by your health care provider.  °HOW IS MY PORT ACCESSED? °Special steps must  be taken to access the port:  °· Before the port is accessed, a numbing cream can be placed on the skin. This helps numb the skin over the port site.   °· Your health care provider uses a sterile technique to access the port. °¨ Your health care provider must put on a mask and sterile gloves. °¨ The skin over your port is cleaned carefully with an antiseptic and allowed to dry. °¨ The port is gently pinched between sterile gloves, and a needle is inserted into the port. °· Only "non-coring" port needles should be used to access the port. Once the port is accessed, a blood return should be checked. This helps ensure that the port is in the vein and is not clogged.   °· If your port needs to remain accessed for a constant infusion, a clear (transparent) bandage will be placed over the needle site. The bandage and needle will need to be changed every week, or as directed by your health care provider.   °· Keep the bandage covering the needle clean and dry. Do not get it wet. Follow your health care provider's instructions on how to take a shower or bath while the port is accessed.   °· If your port does not need to stay accessed, no bandage is needed over the port.   °WHAT IS FLUSHING? °Flushing helps keep the port from getting clogged. Follow your health care provider's instructions on how and when to flush the port. Ports are usually flushed with saline solution or a medicine called heparin. The need for flushing will depend on how the port is used.  °· If the port is used for intermittent medicines or blood draws, the port will need to be flushed:   °¨ After medicines have been given.   °¨ After blood has been drawn.   °¨ As part of routine maintenance.   °· If a constant infusion is running, the port may not need to be flushed.   °HOW LONG WILL MY PORT STAY IMPLANTED? °The port can stay in for as long as your health care provider thinks it is needed. When it is time for the port to come out, surgery will be done to  remove it. The procedure is similar to the one performed when the port was put in.  °WHEN SHOULD I SEEK IMMEDIATE MEDICAL CARE? °When you have an implanted port, you should seek immediate medical care if:  °· You notice a bad smell coming from the incision site.   °· You have swelling, redness, or drainage at the incision site.   °· You have more swelling or pain at the port site or the surrounding area.   °· You have a fever that is not controlled with medicine. °Document Released: 10/14/2005 Document Revised: 08/04/2013 Document Reviewed: 06/21/2013 °ExitCare® Patient Information ©2015 [employer]. This information is not intended to replace advice given to you by your health care provider. Make sure you discuss any questions you have with your health care provider. ° °

## 2014-11-23 NOTE — Progress Notes (Signed)
Pt left the department at 11:30 however we waited for approximately 50 minutes for his family member to return.  She arrived then left to take another family member to an appt so we waited in lobby until family returned.

## 2014-11-24 ENCOUNTER — Other Ambulatory Visit: Payer: BLUE CROSS/BLUE SHIELD

## 2014-11-29 ENCOUNTER — Encounter (HOSPITAL_COMMUNITY): Payer: Self-pay

## 2014-11-29 ENCOUNTER — Telehealth: Payer: Self-pay | Admitting: *Deleted

## 2014-11-29 NOTE — Telephone Encounter (Signed)
Message received in [place] left at 1110 am and retrieved a 245pm.  Message is from [place] in Pathology stating need a call ASAP from Dr Burr Medico regarding " need to get order to obtain purple top ".  Sondra Barges is requesting return call from Dr Burr Medico ASAP to clarify orders and scheduling pt for return call if needed for additional labs .  Return call number given to [name] as [phone].  THIS MESSAGE WILL BE SENT DIRECTLY TO MD FOR HER TO CALL PATHOLOGY.

## 2014-11-30 ENCOUNTER — Other Ambulatory Visit (HOSPITAL_BASED_OUTPATIENT_CLINIC_OR_DEPARTMENT_OTHER): Payer: BLUE CROSS/BLUE SHIELD

## 2014-11-30 ENCOUNTER — Other Ambulatory Visit: Payer: BLUE CROSS/BLUE SHIELD

## 2014-11-30 ENCOUNTER — Encounter: Payer: Self-pay | Admitting: *Deleted

## 2014-11-30 ENCOUNTER — Ambulatory Visit (HOSPITAL_BASED_OUTPATIENT_CLINIC_OR_DEPARTMENT_OTHER): Payer: BLUE CROSS/BLUE SHIELD

## 2014-11-30 DIAGNOSIS — C787 Secondary malignant neoplasm of liver and intrahepatic bile duct: Secondary | ICD-10-CM

## 2014-11-30 DIAGNOSIS — Z5111 Encounter for antineoplastic chemotherapy: Secondary | ICD-10-CM

## 2014-11-30 DIAGNOSIS — C189 Malignant neoplasm of colon, unspecified: Secondary | ICD-10-CM

## 2014-11-30 LAB — COMPREHENSIVE METABOLIC PANEL (CC13)
ALK PHOS: 665 U/L — AB (ref 40–150)
ALT: 32 U/L (ref 0–55)
ANION GAP: 11 meq/L (ref 3–11)
AST: 84 U/L — ABNORMAL HIGH (ref 5–34)
Albumin: 2.8 g/dL — ABNORMAL LOW (ref 3.5–5.0)
BILIRUBIN TOTAL: 1.64 mg/dL — AB (ref 0.20–1.20)
BUN: 25.1 mg/dL (ref 7.0–26.0)
CALCIUM: 9.2 mg/dL (ref 8.4–10.4)
CHLORIDE: 94 meq/L — AB (ref 98–109)
CO2: 28 meq/L (ref 22–29)
Creatinine: 1.2 mg/dL (ref 0.7–1.3)
EGFR: 64 mL/min/{1.73_m2} — AB (ref >90)
GLUCOSE: 216 mg/dL — AB (ref 70–140)
Potassium: 4.3 mEq/L (ref 3.5–5.1)
Sodium: 133 mEq/L — ABNORMAL LOW (ref 136–145)
TOTAL PROTEIN: 8.2 g/dL (ref 6.4–8.3)

## 2014-11-30 LAB — CBC WITH DIFFERENTIAL/PLATELET
BASO%: 0.7 % (ref 0.0–2.0)
BASOS ABS: 0.1 10*3/uL (ref 0.0–0.1)
EOS%: 1.2 % (ref 0.0–7.0)
Eosinophils Absolute: 0.1 10*3/uL (ref 0.0–0.5)
HEMATOCRIT: 38.8 % (ref 38.4–49.9)
HGB: 12.5 g/dL — ABNORMAL LOW (ref 13.0–17.1)
LYMPH%: 13.8 % — ABNORMAL LOW (ref 14.0–49.0)
MCH: 28.1 pg (ref 27.2–33.4)
MCHC: 32.1 g/dL (ref 32.0–36.0)
MCV: 87.7 fL (ref 79.3–98.0)
MONO#: 1.1 10*3/uL — ABNORMAL HIGH (ref 0.1–0.9)
MONO%: 9.1 % (ref 0.0–14.0)
NEUT#: 9.1 10*3/uL — ABNORMAL HIGH (ref 1.5–6.5)
NEUT%: 75.2 % — ABNORMAL HIGH (ref 39.0–75.0)
Platelets: 365 10*3/uL (ref 140–400)
RBC: 4.43 10*6/uL (ref 4.20–5.82)
RDW: 16.3 % — ABNORMAL HIGH (ref 11.0–14.6)
WBC: 12.1 10*3/uL — ABNORMAL HIGH (ref 4.0–10.3)
lymph#: 1.7 10*3/uL (ref 0.9–3.3)

## 2014-11-30 MED ORDER — OXALIPLATIN CHEMO INJECTION 100 MG/20ML
85.0000 mg/m2 | Freq: Once | INTRAVENOUS | Status: AC
  Administered 2014-11-30: 145 mg via INTRAVENOUS
  Filled 2014-11-30: qty 29

## 2014-11-30 MED ORDER — DEXAMETHASONE SODIUM PHOSPHATE 10 MG/ML IJ SOLN
INTRAMUSCULAR | Status: AC
  Filled 2014-11-30: qty 1

## 2014-11-30 MED ORDER — DEXAMETHASONE SODIUM PHOSPHATE 10 MG/ML IJ SOLN
10.0000 mg | Freq: Once | INTRAMUSCULAR | Status: AC
  Administered 2014-11-30: 10 mg via INTRAVENOUS

## 2014-11-30 MED ORDER — DEXTROSE 5 % IV SOLN
400.0000 mg/m2 | Freq: Once | INTRAVENOUS | Status: AC
  Administered 2014-11-30: 684 mg via INTRAVENOUS
  Filled 2014-11-30: qty 34.2

## 2014-11-30 MED ORDER — ONDANSETRON 8 MG/NS 50 ML IVPB
INTRAVENOUS | Status: AC
  Filled 2014-11-30: qty 8

## 2014-11-30 MED ORDER — DEXTROSE 5 % IV SOLN
Freq: Once | INTRAVENOUS | Status: AC
  Administered 2014-11-30: 15:00:00 via INTRAVENOUS

## 2014-11-30 MED ORDER — ONDANSETRON 8 MG/50ML IVPB (CHCC)
8.0000 mg | Freq: Once | INTRAVENOUS | Status: AC
  Administered 2014-11-30: 8 mg via INTRAVENOUS

## 2014-11-30 MED ORDER — FLUOROURACIL CHEMO INJECTION 2.5 GM/50ML
400.0000 mg/m2 | Freq: Once | INTRAVENOUS | Status: AC
  Administered 2014-11-30: 700 mg via INTRAVENOUS
  Filled 2014-11-30: qty 14

## 2014-11-30 MED ORDER — SODIUM CHLORIDE 0.9 % IV SOLN
2400.0000 mg/m2 | INTRAVENOUS | Status: DC
  Administered 2014-11-30: 4100 mg via INTRAVENOUS
  Filled 2014-11-30: qty 82

## 2014-11-30 MED ORDER — LIDOCAINE-PRILOCAINE 2.5-2.5 % EX CREA: 1.0000 "application " | TOPICAL_CREAM | CUTANEOUS | Status: DC | PRN

## 2014-11-30 NOTE — Telephone Encounter (Signed)
I called back.   Jason Reilly

## 2014-11-30 NOTE — Patient Instructions (Signed)
[facility] Discharge Instructions for Patients Receiving Chemotherapy  Today you received the following chemotherapy agents Oxaliplatin, Leucovorin, 5 FU  To help prevent nausea and vomiting after your treatment, we encourage you to take your nausea medication as directed/prescribed   If you develop nausea and vomiting that is not controlled by your nausea medication, call the clinic.   BELOW ARE SYMPTOMS THAT SHOULD BE REPORTED IMMEDIATELY:  *FEVER GREATER THAN 100.5 F  *CHILLS WITH OR WITHOUT FEVER  NAUSEA AND VOMITING THAT IS NOT CONTROLLED WITH YOUR NAUSEA MEDICATION  *UNUSUAL SHORTNESS OF BREATH  *UNUSUAL BRUISING OR BLEEDING  TENDERNESS IN MOUTH AND THROAT WITH OR WITHOUT PRESENCE OF ULCERS  *URINARY PROBLEMS  *BOWEL PROBLEMS  UNUSUAL RASH Items with * indicate a potential emergency and should be followed up as soon as possible.  Feel free to call the clinic you have any questions or concerns. The clinic phone number is [phone].

## 2014-12-01 ENCOUNTER — Telehealth: Payer: Self-pay | Admitting: Certified Registered Nurse Anesthetist

## 2014-12-01 NOTE — Telephone Encounter (Signed)
Call pt's home number listed [phone], pt's son Blair Hailey also name Barnabas Lister answered. He informed me that patient is doing well and this is his number. I asked if patient has his own number. Patient's cell [phone] given to me by Barnabas Lister. This info. Entered into pt's demographic section in [facility].  Called patient and he said he has not had any problems with eating, nausea or bowel trouble. Reviewed nausea medication with pt. And he informed me that his son also called him and talked to him about his medications.  Reviewed to avoid to eat or drink anything cold. Pt. Stated that he has been putting food out.  Reviewed appt. With patient and he asked me to talk to his son. If I can't locate him, ok to call Vicente Males (daughter). Given [place] number to patient and he repeated [phone] back to me.  He has no further questions. Instructed patient to have son/daughter or he can call us himself if he has further problems or questions. HL Called Barnabas Lister and reviewed appt. Scheduled for tomorrow. HL

## 2014-12-02 ENCOUNTER — Encounter: Payer: Self-pay | Admitting: Hematology

## 2014-12-02 ENCOUNTER — Ambulatory Visit (HOSPITAL_BASED_OUTPATIENT_CLINIC_OR_DEPARTMENT_OTHER): Payer: BLUE CROSS/BLUE SHIELD

## 2014-12-02 ENCOUNTER — Telehealth: Payer: Self-pay | Admitting: Hematology

## 2014-12-02 ENCOUNTER — Other Ambulatory Visit: Payer: Self-pay | Admitting: Hematology & Oncology

## 2014-12-02 ENCOUNTER — Telehealth: Payer: Self-pay | Admitting: *Deleted

## 2014-12-02 ENCOUNTER — Ambulatory Visit (HOSPITAL_BASED_OUTPATIENT_CLINIC_OR_DEPARTMENT_OTHER): Payer: BLUE CROSS/BLUE SHIELD | Admitting: Hematology

## 2014-12-02 ENCOUNTER — Encounter (HOSPITAL_COMMUNITY): Payer: Self-pay

## 2014-12-02 VITALS — BP 171/91 | HR 85 | Temp 98.6°F | Resp 18 | Ht 65.0 in | Wt 136.4 lb

## 2014-12-02 DIAGNOSIS — C7972 Secondary malignant neoplasm of left adrenal gland: Secondary | ICD-10-CM

## 2014-12-02 DIAGNOSIS — C787 Secondary malignant neoplasm of liver and intrahepatic bile duct: Secondary | ICD-10-CM

## 2014-12-02 DIAGNOSIS — I10 Essential (primary) hypertension: Secondary | ICD-10-CM

## 2014-12-02 DIAGNOSIS — C7800 Secondary malignant neoplasm of unspecified lung: Secondary | ICD-10-CM

## 2014-12-02 DIAGNOSIS — C189 Malignant neoplasm of colon, unspecified: Secondary | ICD-10-CM

## 2014-12-02 DIAGNOSIS — B1910 Unspecified viral hepatitis B without hepatic coma: Secondary | ICD-10-CM

## 2014-12-02 MED ORDER — HEPARIN SOD (PORK) LOCK FLUSH 100 UNIT/ML IV SOLN
500.0000 [IU] | Freq: Once | INTRAVENOUS | Status: AC | PRN
  Administered 2014-12-02: 500 [IU]
  Filled 2014-12-02: qty 5

## 2014-12-02 MED ORDER — LISINOPRIL-HYDROCHLOROTHIAZIDE 10-12.5 MG PO TABS: 1.0000 | ORAL_TABLET | Freq: Every day | ORAL | Status: DC

## 2014-12-02 MED ORDER — SODIUM CHLORIDE 0.9 % IJ SOLN
10.0000 mL | INTRAMUSCULAR | Status: DC | PRN
  Administered 2014-12-02: 10 mL
  Filled 2014-12-02: qty 10

## 2014-12-02 NOTE — Telephone Encounter (Signed)
I have adjusted appts per POF

## 2014-12-02 NOTE — Progress Notes (Signed)
[facility]  Telephone:(336) [phone] Fax:(336) [facility] Note   Patient Care Team: [name], MD as PCP - General (Specialist) [name], CRNP as Nurse Practitioner (Nurse Practitioner) 12/02/2014  CHIEF COMPLAINTS Metastatic colon cancer    Metastatic colon cancer to liver   10/28/2014 Tumor Marker AFP 3.2 CEA > 10,000 CA 19-9 12,929.6. tumor (-) KRAS and NRAS mutation.    11/10/2014 Imaging PET scan showed hypermetabolic mass in the sigmoid colon was noted metastasis in the retroperitoneum. Probable left adrenal and pulmonary metastasis, and diffuse liver metastasis.   11/21/2014 Initial Diagnosis Metastatic colon cancer to liver, lung, abd nodes and left adrenal gland. Diagnosis was made by liver biopsy.    11/30/2014 -  Chemotherapy First line chemo [id number], Panitumumab added from second cycle      HISTORY OF PRESENTING ILLNESS:  Jason Reilly 69 y.o. male is here because of abnormal CT findings, which is very suspicious for malignancy. He is on ranitidine from [place], has been on in the [place] for 16 years. He came in with his son and an interpreter.  He has been feeling fatigued since two month ago. He is still able to do all ADLs. He otherwise denies any pain, bloating or nausea.  He lost about 20lbs in 3 month. His appetite is lower than before, eats less, no change of his bowl habits.  She denied any hematochezia or melana. Per his son, he has had some personality changes daily, irritable, slightly confused some time.  He was evaluated by his primary care physician. Lab test reviewed hepatitis B infection, which he did not know before, and elevated alkaline phosphatase, his liver function and the rest of the liver function was not remarkable. [place] of abdomen was obtained on 07/22/2014, which showed diffusely abnormal liver with multiple echogenic lesions. CT of abdomen with and without contrast was done on 08/26/2014, which reviewed here at a  medically with multiple large partially calcified hepatic masses consistent with metastatic disease. Mild retroperitoneal adenopathy with the largest node measuring 1.6 cm. And nonspecific 1.4 cm left adrenal nodule was also noticed. His tumor marker showed CEA greater than 10,000, CA 19-9 12,929, AFP 3.2 (normal). He was referred to [facility] and was evaluated by nurse practitioner [name]. Treatment for hepatitis B was not recommended based on his virus load.  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 He was evaluated by a cardiologist in 2014. He denies any significant dyspnea on exertion. No leg swollen.  INTERIM HISTORY: [name] returns for follow-up. He started chemo 2 days ago and will disconnected the pump today. He developed some hiccups since last night. No fever or chill, no significant nausea or vomiting. His appetite is better than before, eats and drinks well.    MEDICAL HISTORY:  Past Medical History  Diagnosis Date  . Tachycardia   . Abnormal EKG   . Hypertension   . Non-ischemic cardiomyopathy   . Hepatitis B     SURGICAL HISTORY: Past Surgical History  Procedure Laterality Date  . Nuclear stress test  03/03/2013    High risk - consistent with nonischemic cardiomyopathy  . Left and right heart catheterization with coronary angiogram N/A 03/29/2013    Procedure: LEFT AND RIGHT HEART CATHETERIZATION WITH CORONARY ANGIOGRAM;  Surgeon: Pixie Casino, MD;  Location: [facility] CATH LAB;  Service: Cardiovascular;  Laterality: N/A;    SOCIAL HISTORY: History   Social History  .  Marital Status: Unknown    Spouse Name: N/A    Number of Children: N/A  . Years of Education: N/A   Occupational History  . Not on file.   Social History Main Topics  . Smoking status: Former Research scientist (life sciences)  . Smokeless tobacco: Not on file  . Alcohol Use: Yes     Comment: occasional  . Drug Use: No  . Sexual Activity: Not on file     Other Topics Concern  . Not on file   Social History Narrative    FAMILY HISTORY: No family history of liver disease or malignancy.  ALLERGIES:  has No Known Allergies.  MEDICATIONS:  Current Outpatient Prescriptions  Medication Sig Dispense Refill  . aspirin EC 81 MG tablet Take 1 tablet (81 mg total) by mouth daily. (Patient not taking: Reported on 11/02/2014) 90 tablet 3  . lisinopril-hydrochlorothiazide (PRINZIDE,ZESTORETIC) 10-12.5 MG per tablet TAKE 1 TABLET BY MOUTH EVERY DAY (Patient not taking: Reported on 11/02/2014) 90 tablet 3  . metoprolol (LOPRESSOR) 50 MG tablet TAKE 1 TABLET BY MOUTH TWICE A DAY (Patient not taking: Reported on 11/02/2014) 180 tablet 3  . metoprolol succinate (TOPROL-XL) 50 MG 24 hr tablet Take 50 mg by mouth 2 (two) times daily. Take with or immediately following a meal.    .        No current facility-administered medications for this visit.    REVIEW OF SYSTEMS:   Constitutional: Denies fevers, chills or abnormal night sweats, (+) fatigue  Eyes: Denies blurriness of vision, double vision or watery eyes Ears, nose, mouth, throat, and face: Denies mucositis or sore throat Respiratory: Denies cough, dyspnea or wheezes Cardiovascular: Denies palpitation, chest discomfort or lower extremity swelling Gastrointestinal: (+) anorexia.   Denies nausea, heartburn or change in bowel habits Skin: Denies abnormal skin rashes Lymphatics: Denies new lymphadenopathy or easy bruising Neurological:Denies numbness, tingling or new weaknesses Behavioral/Psych: Mood is stable, no new changes, (+) insomnia All other systems were reviewed with the patient and are negative.  PHYSICAL EXAMINATION: ECOG PERFORMANCE STATUS: 1 - Symptomatic but completely ambulatory  Filed Vitals:   12/02/14 1257  BP: 171/91  Pulse: 85  Temp: 98.6 F (37 C)  Resp: 18   Filed Weights   12/02/14 1257  Weight: 136 lb 6.4 oz (61.871 kg)    GENERAL:alert, no distress and  comfortable SKIN: skin color, texture, turgor are normal, (+) several large papular skin rash on the neck and face, no other rashes or significant lesions EYES: normal, conjunctiva are pink and non-injected, sclera clear OROPHARYNX:no exudate, no erythema and lips, buccal mucosa, and tongue normal  NECK: supple, thyroid normal size, non-tender, without nodularity LYMPH:  no palpable lymphadenopathy in the cervical, axillary or inguinal LUNGS: clear to auscultation and percussion with normal breathing effort HEART: regular rate & rhythm and no murmurs and no lower extremity edema ABDOMEN:abdomen soft, non-tender, (+) hepatomegaly, liver is palpable 3.5 cm under rib cage, soft nontender, no splenomegaly and normal bowel sounds Musculoskeletal:no cyanosis of digits and no clubbing  PSYCH: alert & oriented x 3 with fluent speech NEURO: no focal motor/sensory deficits  LABORATORY DATA:  I have reviewed the data as listed Lab Results  Component Value Date   WBC 12.1* 11/30/2014   HGB 12.5* 11/30/2014   HCT 38.8 11/30/2014   MCV 87.7 11/30/2014   PLT 365 11/30/2014    Recent Labs  11/02/14 1612 11/22/14 1424 11/30/14 1054  NA 136 139 133*  K 4.3 3.2* 4.3  CO2 27  25 28  GLUCOSE 88 155* 216*  BUN 12.0 5.6* 25.1  CREATININE 0.8 0.8 1.2  CALCIUM 9.6 8.9 9.2  PROT 8.5* 8.2 8.2  ALBUMIN 3.2* 2.9* 2.8*  AST 63* 74* 84*  ALT 29 29 32  ALKPHOS 568* 581* 665*  BILITOT 1.12 1.35* 1.64*   AFP 3.2 CEA > 10,000 CA 19-9 12,929.6  Pathology report  Liver, needle/core biopsy - METASTATIC ADENOCARCINOMA, SEE COMMENT. Microscopic Comment The adenocarcinoma demonstrates the following immunophenotype: Cytokeratin 7 - negative expression. Cytokeratin 20 - strong diffuse expression. CD2 - strong diffuse expression. Overall the morphology and immunophenotype are that of metastatic adenocarcinoma primary to colorectum. The recent nuclear medicine scan demonstrating sigmoid mass with  associated liver masses is noted.      RADIOGRAPHIC STUDIES: I have personally reviewed the outside CT scan image with patient and his son.   PET 11/10/2014 IMPRESSION: 1. 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 Probable left adrenal and pulmonary metastasis. 2. Widespread hepatic metastasis. Favor secondary to colon primary. The liver does have a somewhat atypical morphology, favored to be related to the extent of metastatic disease. Cirrhosis with synchronous multifocal hepatocellular carcinoma felt less likely.   ASSESSMENT & PLAN:  69 year old [place] male, with past history of hypertension and dilated nonischemic gammopathy with EF 25%, no clinical signs of heart failure, who was found to have hepatitis B infection lately, and multiple liver lesions on the CT scan. He has extremely high CEA and CA 19-9 levels. PET scan reviewed a hypermetabolic sigmoid colon mass, diffuse liver metastasis, probable lung and adrenal gland metastasis.  1. Metastatic sigmoid colon cancer, with diffuse liver, lungs, node and left adrenal gland metastases. KRAS/NRAS wild type  -Liver biopsy showed metastatic adenocarcinoma. His tumor were strongly positive for CK20 and CD2, consistent with primary colorectal primary. KRAS and NRAS mutations were not detected.  -Pt understands that this is incurable cancer, and he has very high disease burden and overall prognosis is poor. The treatment goal is palliative -He started FOLFOX this week.  -I discussed his K-ras and NRAS test results. Giving the wild-type genotype, he would benefit from EGFR targeted therapy. I will add on panitumumab from next cycle. Side effects especially skin rash and infusion reaction were discussed with patient and he agrees to proceed.  2. 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 However this is probably going to impact his chemotherapy. I'll try to avoid cardiotoxic chemotherapy agent and avoid fluid overload during chemotherapy. -His blood pressure is high today. He states he has been taking his medications. He just write out his medication today, but will refill today. -I'll ask him to monitor his blood pressure at home. -Continue follow-up with cardiology.  3. Hepatitis B carrier -Per liver clinic, no need for treatment. Follow-up with liver clinic.  4. [place] -Possibly related to pre-chemotherapy dexamethasone -He does not want medication at this point.   Plan --d/c pump today -RTC on 2/17 before second cycle chemo, will add on panitumumab from next cycle.  All questions were answered. The patient knows to call the clinic with any problems, questions or concerns. I spent 20 minutes counseling the patient face to face. The total time spent in the appointment was 30 minutes and more than 50% was on counseling.     Truitt Merle, MD 12/02/2014 1:40 PM

## 2014-12-02 NOTE — Progress Notes (Signed)
Desk nurse disconnected 5FU pump after office visit.  Noted no dressing at port site - huber needle intact but no dressing coverage.  Pt stated he took off the dressing last night to take shower.  Instructed pt , son, and interpreter that pt should not take off dressing - if it is loose, pt can add tape to reinforce sealing.   Instructed pt and son through interpreter to monitor for any signs of infection - increased temp, coughing, chills, and to notify office immediately.  Pt, son, and interpreter voiced understanding.  [name] made aware of above situation.

## 2014-12-02 NOTE — Telephone Encounter (Signed)
Pt confirmed labs/ov per 02/05 POF, gave pt AVS..... KJ, chemo already added, sent msg to see if MD can see pt in procedure room 1 where pt will be.... KJ

## 2014-12-05 ENCOUNTER — Other Ambulatory Visit: Payer: Self-pay | Admitting: Hematology

## 2014-12-14 ENCOUNTER — Ambulatory Visit (HOSPITAL_BASED_OUTPATIENT_CLINIC_OR_DEPARTMENT_OTHER): Payer: BLUE CROSS/BLUE SHIELD

## 2014-12-14 ENCOUNTER — Other Ambulatory Visit (HOSPITAL_BASED_OUTPATIENT_CLINIC_OR_DEPARTMENT_OTHER): Payer: BLUE CROSS/BLUE SHIELD

## 2014-12-14 ENCOUNTER — Ambulatory Visit: Payer: BLUE CROSS/BLUE SHIELD | Admitting: Nutrition

## 2014-12-14 ENCOUNTER — Ambulatory Visit (HOSPITAL_BASED_OUTPATIENT_CLINIC_OR_DEPARTMENT_OTHER): Payer: BLUE CROSS/BLUE SHIELD | Admitting: Hematology

## 2014-12-14 ENCOUNTER — Encounter: Payer: Self-pay | Admitting: Hematology

## 2014-12-14 VITALS — BP 103/60 | HR 58 | Temp 97.8°F | Wt 131.8 lb

## 2014-12-14 DIAGNOSIS — C189 Malignant neoplasm of colon, unspecified: Secondary | ICD-10-CM

## 2014-12-14 DIAGNOSIS — Z5111 Encounter for antineoplastic chemotherapy: Secondary | ICD-10-CM

## 2014-12-14 DIAGNOSIS — B1910 Unspecified viral hepatitis B without hepatic coma: Secondary | ICD-10-CM

## 2014-12-14 DIAGNOSIS — Z5112 Encounter for antineoplastic immunotherapy: Secondary | ICD-10-CM

## 2014-12-14 DIAGNOSIS — C787 Secondary malignant neoplasm of liver and intrahepatic bile duct: Secondary | ICD-10-CM

## 2014-12-14 DIAGNOSIS — I10 Essential (primary) hypertension: Secondary | ICD-10-CM

## 2014-12-14 DIAGNOSIS — C7800 Secondary malignant neoplasm of unspecified lung: Secondary | ICD-10-CM

## 2014-12-14 DIAGNOSIS — C7970 Secondary malignant neoplasm of unspecified adrenal gland: Secondary | ICD-10-CM

## 2014-12-14 DIAGNOSIS — I428 Other cardiomyopathies: Secondary | ICD-10-CM

## 2014-12-14 DIAGNOSIS — E46 Unspecified protein-calorie malnutrition: Secondary | ICD-10-CM

## 2014-12-14 LAB — CBC WITH DIFFERENTIAL/PLATELET
BASO%: 0.8 % (ref 0.0–2.0)
BASOS ABS: 0.1 10*3/uL (ref 0.0–0.1)
EOS%: 2.8 % (ref 0.0–7.0)
Eosinophils Absolute: 0.2 10*3/uL (ref 0.0–0.5)
HEMATOCRIT: 39.3 % (ref 38.4–49.9)
HEMOGLOBIN: 13.2 g/dL (ref 13.0–17.1)
LYMPH%: 33.1 % (ref 14.0–49.0)
MCH: 29.3 pg (ref 27.2–33.4)
MCHC: 33.6 g/dL (ref 32.0–36.0)
MCV: 87.1 fL (ref 79.3–98.0)
MONO#: 0.9 10*3/uL (ref 0.1–0.9)
MONO%: 13.7 % (ref 0.0–14.0)
NEUT#: 3.2 10*3/uL (ref 1.5–6.5)
NEUT%: 49.6 % (ref 39.0–75.0)
PLATELETS: 279 10*3/uL (ref 140–400)
RBC: 4.51 10*6/uL (ref 4.20–5.82)
RDW: 14.8 % — ABNORMAL HIGH (ref 11.0–14.6)
WBC: 6.4 10*3/uL (ref 4.0–10.3)
lymph#: 2.1 10*3/uL (ref 0.9–3.3)

## 2014-12-14 LAB — COMPREHENSIVE METABOLIC PANEL (CC13)
ALK PHOS: 394 U/L — AB (ref 40–150)
ALT: 29 U/L (ref 0–55)
AST: 51 U/L — ABNORMAL HIGH (ref 5–34)
Albumin: 3.2 g/dL — ABNORMAL LOW (ref 3.5–5.0)
Anion Gap: 8 mEq/L (ref 3–11)
BILIRUBIN TOTAL: 0.79 mg/dL (ref 0.20–1.20)
BUN: 12.6 mg/dL (ref 7.0–26.0)
CALCIUM: 10.3 mg/dL (ref 8.4–10.4)
CO2: 30 mEq/L — ABNORMAL HIGH (ref 22–29)
Chloride: 98 mEq/L (ref 98–109)
Creatinine: 1 mg/dL (ref 0.7–1.3)
EGFR: 82 mL/min/{1.73_m2} — ABNORMAL LOW (ref >90)
GLUCOSE: 141 mg/dL — AB (ref 70–140)
POTASSIUM: 5.7 meq/L — AB (ref 3.5–5.1)
Sodium: 136 mEq/L (ref 136–145)
TOTAL PROTEIN: 8.3 g/dL (ref 6.4–8.3)

## 2014-12-14 LAB — MAGNESIUM (CC13): Magnesium: 2 mg/dl (ref 1.5–2.5)

## 2014-12-14 MED ORDER — FLUOROURACIL CHEMO INJECTION 2.5 GM/50ML
400.0000 mg/m2 | Freq: Once | INTRAVENOUS | Status: AC
Start: 2014-12-14 — End: 2014-12-14
  Administered 2014-12-14: 700 mg via INTRAVENOUS
  Filled 2014-12-14: qty 14

## 2014-12-14 MED ORDER — ONDANSETRON 8 MG/50ML IVPB (CHCC)
8.0000 mg | Freq: Once | INTRAVENOUS | Status: AC
  Administered 2014-12-14: 8 mg via INTRAVENOUS

## 2014-12-14 MED ORDER — SODIUM CHLORIDE 0.9 % IV SOLN
6.2000 mg/kg | Freq: Once | INTRAVENOUS | Status: AC
  Administered 2014-12-14: 400 mg via INTRAVENOUS
  Filled 2014-12-14: qty 20

## 2014-12-14 MED ORDER — DEXAMETHASONE SODIUM PHOSPHATE 10 MG/ML IJ SOLN
10.0000 mg | Freq: Once | INTRAMUSCULAR | Status: AC
  Administered 2014-12-14: 10 mg via INTRAVENOUS

## 2014-12-14 MED ORDER — DEXAMETHASONE SODIUM PHOSPHATE 10 MG/ML IJ SOLN
INTRAMUSCULAR | Status: AC
  Filled 2014-12-14: qty 1

## 2014-12-14 MED ORDER — ONDANSETRON 8 MG/NS 50 ML IVPB
INTRAVENOUS | Status: AC
  Filled 2014-12-14: qty 8

## 2014-12-14 MED ORDER — LEUCOVORIN CALCIUM INJECTION 350 MG
398.0000 mg/m2 | Freq: Once | INTRAVENOUS | Status: AC
  Administered 2014-12-14: 680 mg via INTRAVENOUS
  Filled 2014-12-14: qty 34

## 2014-12-14 MED ORDER — DEXTROSE 5 % IV SOLN
Freq: Once | INTRAVENOUS | Status: AC
  Administered 2014-12-14: 13:00:00 via INTRAVENOUS

## 2014-12-14 MED ORDER — FLUOROURACIL CHEMO INJECTION 5 GM/100ML
2400.0000 mg/m2 | INTRAVENOUS | Status: DC
  Administered 2014-12-14: 4100 mg via INTRAVENOUS
  Filled 2014-12-14: qty 82

## 2014-12-14 MED ORDER — SODIUM CHLORIDE 0.9 % IV SOLN
Freq: Once | INTRAVENOUS | Status: AC
  Administered 2014-12-14: 11:00:00 via INTRAVENOUS

## 2014-12-14 MED ORDER — OXALIPLATIN CHEMO INJECTION 100 MG/20ML
85.0000 mg/m2 | Freq: Once | INTRAVENOUS | Status: AC
  Administered 2014-12-14: 145 mg via INTRAVENOUS
  Filled 2014-12-14: qty 29

## 2014-12-14 NOTE — Patient Instructions (Signed)
[facility] Discharge Instructions for Patients Receiving Chemotherapy  Today you received the following chemotherapy agents: Vectibix, Oxaliplatin, Leucovorin and Fluorouracil.  To help prevent nausea and vomiting after your treatment, we encourage you to take your nausea medication: Zofran. Take one every 8 hours as needed.   If you develop nausea and vomiting that is not controlled by your nausea medication, call the clinic.   BELOW ARE SYMPTOMS THAT SHOULD BE REPORTED IMMEDIATELY:  *FEVER GREATER THAN 100.5 F  *CHILLS WITH OR WITHOUT FEVER  NAUSEA AND VOMITING THAT IS NOT CONTROLLED WITH YOUR NAUSEA MEDICATION  *UNUSUAL SHORTNESS OF BREATH  *UNUSUAL BRUISING OR BLEEDING  TENDERNESS IN MOUTH AND THROAT WITH OR WITHOUT PRESENCE OF ULCERS  *URINARY PROBLEMS  *BOWEL PROBLEMS  UNUSUAL RASH Items with * indicate a potential emergency and should be followed up as soon as possible.  Feel free to call the clinic should you have any questions or concerns. The clinic phone number is [phone].

## 2014-12-14 NOTE — Progress Notes (Signed)
Pt accompanied by son and [name] language interpreter [employer]. Venous port dressing care instructions reviewed, pt and son verbalized understanding.

## 2014-12-14 NOTE — Progress Notes (Signed)
Received a request from nursing to speak with patient in chemotherapy today secondary to weight loss.  Patient is a 69 year old male diagnosed with metastatic colon cancer.  Discussion is conducted with translator.  Past medical history includes tachycardia, hypertension, and hepatitis B.  Medications include Remeron and Zofran.  Labs were reviewed.  Height: 65 inches. Weight: 131.75 pounds February 17. Usual body weight: 160 pounds. BMI: 21.92.  Patient reports fatigue along with decreased appetite and decreased oral intake.   Patient is receiving FOLFOX therapy which is palliative in nature. Patient is drinking one regular ensure daily. Patient denies other nutrition impact symptoms.  Nutrition diagnosis: Unintended weight loss related to inadequate oral intake as evidenced by 18% weight loss from usual body weight.  Intervention:  Patient educated to consume smaller more frequent meals and snacks consisting of high-calorie, high-protein foods. Recommended patient change regular ensure to Ensure Plus and consume twice a day between meals. Provided samples for patient to consume along with coupons. Questions were answered.  Teach back method used.  Contact information was given.  Monitoring, evaluation, goals: Patient will work to increase oral intake to minimize further weight loss.  Next visit: Wednesday, March 2, during chemotherapy.  **Disclaimer: This note was dictated with voice recognition software. Similar sounding words can inadvertently be transcribed and this note may contain transcription errors which may not have been corrected upon publication of note.**

## 2014-12-15 ENCOUNTER — Telehealth: Payer: Self-pay | Admitting: *Deleted

## 2014-12-15 ENCOUNTER — Encounter: Payer: Self-pay | Admitting: Hematology

## 2014-12-15 ENCOUNTER — Telehealth: Payer: Self-pay

## 2014-12-15 NOTE — Telephone Encounter (Signed)
THIS NOTE TO DR.[place].

## 2014-12-15 NOTE — Telephone Encounter (Signed)
Used [employer]. interreter id number [id number]. lvm on cell we are calling to f/u on how he is doing. S/w son at home number and he said pt is doing ok so far, no side effects. Instructed to call with any questions.

## 2014-12-16 ENCOUNTER — Ambulatory Visit (HOSPITAL_BASED_OUTPATIENT_CLINIC_OR_DEPARTMENT_OTHER): Payer: BLUE CROSS/BLUE SHIELD

## 2014-12-16 DIAGNOSIS — C189 Malignant neoplasm of colon, unspecified: Secondary | ICD-10-CM

## 2014-12-16 DIAGNOSIS — C787 Secondary malignant neoplasm of liver and intrahepatic bile duct: Secondary | ICD-10-CM

## 2014-12-16 MED ORDER — SODIUM CHLORIDE 0.9 % IJ SOLN
10.0000 mL | INTRAMUSCULAR | Status: DC | PRN
  Administered 2014-12-16: 10 mL
  Filled 2014-12-16: qty 10

## 2014-12-16 MED ORDER — HEPARIN SOD (PORK) LOCK FLUSH 100 UNIT/ML IV SOLN
500.0000 [IU] | Freq: Once | INTRAVENOUS | Status: AC | PRN
  Administered 2014-12-16: 500 [IU]
  Filled 2014-12-16: qty 5

## 2014-12-16 NOTE — Patient Instructions (Signed)
Fluorouracil, 5FU; Diclofenac topical cream What is this medicine? FLUOROURACIL; DICLOFENAC (flure oh YOOR a sil; dye KLOE fen ak) is a combination of a topical chemotherapy agent and non-steroidal anti-inflammatory drug (NSAID). It is used on the skin to treat skin cancer and skin conditions that could become cancer. This medicine may be used for other purposes; ask your health care provider or pharmacist if you have questions. COMMON BRAND NAME(S): FLUORAC What should I tell my health care provider before I take this medicine? They need to know if you have any of these conditions: -bleeding problems -cigarette smoker -DPD enzyme deficiency -heart disease -high blood pressure -if you frequently drink alcohol containing drinks -kidney disease -liver disease -open or infected skin -stomach problems -swelling or open sores at the treatment site -recent or planned coronary artery bypass graft (CABG) surgery -an unusual or allergic reaction to fluorouracil, diclofenac, aspirin, other NSAIDs, other medicines, foods, dyes, or preservatives -pregnant or trying to get pregnant -breast-feeding How should I use this medicine? This medicine is only for use on the skin. Follow the directions on the prescription label. Wash hands before and after use. Wash affected area and gently pat dry. To apply this medicine use a cotton-tipped applicator, or use gloves if applying with fingertips. If applied with unprotected fingertips, it is very important to wash your hands well after you apply this medicine. Avoid applying to the eyes, nose, or mouth. Apply enough medicine to cover the affected area. You can cover the area with a light gauze dressing, but do not use tight or air-tight dressings. Finish the full course prescribed by your doctor or health care professional, even if you think your condition is better. Do not stop taking except on the advice of your doctor or health care professional. Talk to your  pediatrician regarding the use of this medicine in children. Special care may be needed. Overdosage: If you think you've taken too much of this medicine contact a poison control center or emergency room at once. Overdosage: If you think you have taken too much of this medicine contact a poison control center or emergency room at once. NOTE: This medicine is only for you. Do not share this medicine with others. What if I miss a dose? If you miss a dose, apply it as soon as you can. If it is almost time for your next dose, only use that dose. Do not apply extra doses. Contact your doctor or health care professional if you miss more than one dose. What may interact with this medicine? Interactions are not expected. Do not use any other skin products without telling your doctor or health care professional. This list may not describe all possible interactions. Give your health care provider a list of all the medicines, herbs, non-prescription drugs, or dietary supplements you use. Also tell them if you smoke, drink alcohol, or use illegal drugs. Some items may interact with your medicine. What should I watch for while using this medicine? Visit your doctor or health care professional for checks on your progress. You will need to use this medicine for 2 to 6 weeks. This may be longer depending on the condition being treated. You may not see full healing for another 1 to 2 months after you stop using the medicine. Treated areas of skin can look unsightly during and for several weeks after treatment with this medicine. This medicine can make you more sensitive to the sun. Keep out of the sun. If you cannot avoid being in   the sun, wear protective clothing and use sunscreen. Do not use sun lamps or tanning beds/booths. Where should I keep my What side effects may I notice from receiving this medicine? Side effects that you should report to your doctor or health care professional as soon as possible: -allergic  reactions like skin rash, itching or hives, swelling of the face, lips, or tongue -black or bloody stools, blood in the urine or vomit -blurred vision -chest pain -difficulty breathing or wheezing -redness, blistering, peeling or loosening of the skin, including inside the mouth -severe redness and swelling of normal skin -slurred speech or weakness on one side of the body -trouble passing urine or change in the amount of urine -unexplained weight gain or swelling -unusually weak or tired -yellowing of eyes or skin Side effects that usually do not require medical attention (Report these to your doctor or health care professional if they continue or are bothersome.): -increased sensitivity of the skin to sun and ultraviolet light -pain and burning of the affected area -scaling or swelling of the affected area -skin rash, itching of the affected area -tenderness This list may not describe all possible side effects. Call your doctor for medical advice about side effects. You may report side effects to FDA at 1-800-FDA-1088. Where should I keep my medicine? Keep out of the reach of children. Store at room temperature between 20 and 25 degrees C (68 and 77 degrees F). Throw away any unused medicine after the expiration date. NOTE: This sheet is a summary. It may not cover all possible information. If you have questions about this medicine, talk to your doctor, pharmacist, or health care provider.  2015, Elsevier/Gold Standard. (2014-02-14 11:09:58)  

## 2014-12-16 NOTE — Telephone Encounter (Signed)
Left message for Lorre Munroe at [facility] [phone] for direction as how to proceed with letter he requested.  Should we mail or fax this and to what address/number?  Requested that he call us back - triage or [name]' nurse.

## 2014-12-18 ENCOUNTER — Encounter: Payer: Self-pay | Admitting: Hematology

## 2014-12-18 NOTE — Progress Notes (Signed)
[facility]  Telephone:([phone] Fax:([phone]) [place] Note   Patient Care Team: [name], MD as PCP - General (Specialist) [name], CRNP as Nurse Practitioner (Nurse Practitioner) 12/18/2014  CHIEF COMPLAINTS Metastatic colon cancer    Metastatic colon cancer to liver   10/28/2014 Tumor Marker AFP 3.2 CEA > 10,000 CA 19-9 12,929.6. tumor (-) KRAS and NRAS mutation.    11/10/2014 Imaging PET scan showed hypermetabolic mass in the sigmoid colon was noted metastasis in the retroperitoneum. Probable left adrenal and pulmonary metastasis, and diffuse liver metastasis.   11/21/2014 Initial Diagnosis Metastatic colon cancer to liver, lung, abd nodes and left adrenal gland. Diagnosis was made by liver biopsy.    11/30/2014 -  Chemotherapy First line chemo mFOLFOX6, Panitumumab added from second cycle    CURRENT THERAPY: mFOLFOX6 started on 11/30/2014, Panitumumab added from second cycle   HISTORY OF PRESENTING ILLNESS:  Jason Reilly 69 y.o. male is here because of abnormal CT findings, which is very suspicious for malignancy. He is on ranitidine from [place], has been on in the [place] for 16 years. He came in with his son and an interpreter.  He has been feeling fatigued since two month ago. He is still able to do all ADLs. He otherwise denies any pain, bloating or nausea.  He lost about 20lbs in 3 month. His appetite is lower than before, eats less, no change of his bowl habits.  She denied any hematochezia or melana. Per his son, he has had some personality changes daily, irritable, slightly confused some time.  He was evaluated by his primary care physician. Lab test reviewed hepatitis B infection, which he did not know before, and elevated alkaline phosphatase, his liver function and the rest of the liver function was not remarkable. [place] of abdomen was obtained on 07/22/2014, which showed diffusely abnormal liver with multiple echogenic lesions. CT of  abdomen with and without contrast was done on 08/26/2014, which reviewed here at a medically with multiple large partially calcified hepatic masses consistent with metastatic disease. Mild retroperitoneal adenopathy with the largest node measuring 1.6 cm. And nonspecific 1.4 cm left adrenal nodule was also noticed. His tumor marker showed CEA greater than 10,000, CA 19-9 12,929, AFP 3.2 (normal). He was referred to [facility] and was evaluated by nurse practitioner [name]. Treatment for hepatitis B was not recommended based on his virus load.  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 He was evaluated by a cardiologist in 2014. He denies any significant dyspnea on exertion. No leg swollen.  INTERIM HISTORY: Jason Reilly returns for follow-up and the second cycle chemotherapy. He is doing well overall. No significant nausea vomiting or other complaints from prior chemotherapy. He has moderate fatigue and anorexia, able to function well at home. He lost about 7 pounds after the first cycle chemotherapy. No fever or chills.  MEDICAL HISTORY:  Past Medical History  Diagnosis Date  . Tachycardia   . Abnormal EKG   . Hypertension   . Non-ischemic cardiomyopathy   . Hepatitis B     SURGICAL HISTORY: Past Surgical History  Procedure Laterality Date  . Nuclear stress test  03/03/2013    High risk - consistent with nonischemic cardiomyopathy  . Left and right heart catheterization with coronary angiogram N/A 03/29/2013    Procedure: LEFT AND RIGHT HEART CATHETERIZATION WITH CORONARY ANGIOGRAM;  Surgeon: Pixie Casino, MD;  Location: [facility] CATH LAB;  Service: Cardiovascular;  Laterality: N/A;    SOCIAL HISTORY: History   Social History  . Marital Status: Unknown    Spouse Name: N/A  . Number of Children: N/A  . Years of Education: N/A   Occupational History  . Not on file.   Social History Main Topics  . Smoking status:  Former Research scientist (life sciences)  . Smokeless tobacco: Not on file  . Alcohol Use: Yes     Comment: occasional  . Drug Use: No  . Sexual Activity: Not on file   Other Topics Concern  . Not on file   Social History Narrative    FAMILY HISTORY: No family history of liver disease or malignancy.  ALLERGIES:  has No Known Allergies.  MEDICATIONS:  Current Outpatient Prescriptions  Medication Sig Dispense Refill  . aspirin EC 81 MG tablet Take 1 tablet (81 mg total) by mouth daily. (Patient not taking: Reported on 11/02/2014) 90 tablet 3  . lisinopril-hydrochlorothiazide (PRINZIDE,ZESTORETIC) 10-12.5 MG per tablet TAKE 1 TABLET BY MOUTH EVERY DAY (Patient not taking: Reported on 11/02/2014) 90 tablet 3  . metoprolol (LOPRESSOR) 50 MG tablet TAKE 1 TABLET BY MOUTH TWICE A DAY (Patient not taking: Reported on 11/02/2014) 180 tablet 3  . metoprolol succinate (TOPROL-XL) 50 MG 24 hr tablet Take 50 mg by mouth 2 (two) times daily. Take with or immediately following a meal.    .        No current facility-administered medications for this visit.    REVIEW OF SYSTEMS:   Constitutional: Denies fevers, chills or abnormal night sweats, (+) fatigue  Eyes: Denies blurriness of vision, double vision or watery eyes Ears, nose, mouth, throat, and face: Denies mucositis or sore throat Respiratory: Denies cough, dyspnea or wheezes Cardiovascular: Denies palpitation, chest discomfort or lower extremity swelling Gastrointestinal: (+) anorexia.   Denies nausea, heartburn or change in bowel habits Skin: Denies abnormal skin rashes Lymphatics: Denies new lymphadenopathy or easy bruising Neurological:Denies numbness, tingling or new weaknesses Behavioral/Psych: Mood is stable, no new changes, (+) insomnia All other systems were reviewed with the patient and are negative.  PHYSICAL EXAMINATION: ECOG PERFORMANCE STATUS: 1 - Symptomatic but completely ambulatory Vital sign were taken at in the infusion room, within normal  limits. GENERAL:alert, no distress and comfortable SKIN: skin color, texture, turgor are normal, (+) several large papular skin rash on the neck and face, no other rashes or significant lesions EYES: normal, conjunctiva are pink and non-injected, sclera clear OROPHARYNX:no exudate, no erythema and lips, buccal mucosa, and tongue normal  NECK: supple, thyroid normal size, non-tender, without nodularity LYMPH:  no palpable lymphadenopathy in the cervical, axillary or inguinal LUNGS: clear to auscultation and percussion with normal breathing effort HEART: regular rate & rhythm and no murmurs and no lower extremity edema ABDOMEN:abdomen soft, non-tender, (+) hepatomegaly, liver is palpable 3.5 cm under rib cage, soft nontender, no splenomegaly and normal bowel sounds Musculoskeletal:no cyanosis of digits and no clubbing  PSYCH: alert & oriented x 3 with fluent speech NEURO: no focal motor/sensory deficits  LABORATORY DATA:  I have reviewed the data as listed Lab Results  Component Value Date   WBC 6.4 12/14/2014   HGB 13.2 12/14/2014   HCT 39.3 12/14/2014   MCV 87.1 12/14/2014   PLT 279 12/14/2014    Recent Labs  11/22/14 1424 11/30/14 1054 12/14/14 0907  NA 139 133* 136  K 3.2* 4.3 5.7*  CO2 25 28 30*  GLUCOSE 155* 216* 141*  BUN 5.6* 25.1 12.6  CREATININE  0.8 1.2 1.0  CALCIUM 8.9 9.2 10.3  PROT 8.2 8.2 8.3  ALBUMIN 2.9* 2.8* 3.2*  AST 74* 84* 51*  ALT 29 32 29  ALKPHOS 581* 665* 394*  BILITOT 1.35* 1.64* 0.79   AFP 3.2 CEA > 10,000 CA 19-9 12,929.6  Pathology report  Liver, needle/core biopsy - METASTATIC ADENOCARCINOMA, SEE COMMENT. Microscopic Comment The adenocarcinoma demonstrates the following immunophenotype: Cytokeratin 7 - negative expression. Cytokeratin 20 - strong diffuse expression. CD2 - strong diffuse expression. Overall the morphology and immunophenotype are that of metastatic adenocarcinoma primary to colorectum. The recent nuclear medicine scan  demonstrating sigmoid mass with associated liver masses is noted.      RADIOGRAPHIC STUDIES: I have personally reviewed the outside CT scan image with patient and his son.   PET 11/10/2014 IMPRESSION: 1. 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 Probable left adrenal and pulmonary metastasis. 2. Widespread hepatic metastasis. Favor secondary to colon primary. The liver does have a somewhat atypical morphology, favored to be related to the extent of metastatic disease. Cirrhosis with synchronous multifocal hepatocellular carcinoma felt less likely.   ASSESSMENT & PLAN:  69 year old [place] male, with past history of hypertension and dilated nonischemic gammopathy with EF 25%, no clinical signs of heart failure, who was found to have hepatitis B infection lately, and multiple liver lesions on the CT scan. He has extremely high CEA and CA 19-9 levels. PET scan reviewed a hypermetabolic sigmoid colon mass, diffuse liver metastasis, probable lung and adrenal gland metastasis.  1. Metastatic sigmoid colon cancer, with diffuse liver, lungs, node and left adrenal gland metastases. KRAS/NRAS wild type  -Liver biopsy showed metastatic adenocarcinoma. His tumor were strongly positive for CK20 and CD2, consistent with primary colorectal primary. KRAS and NRAS mutations were not detected.  -Pt understands that this is incurable cancer, and he has very high disease burden and overall prognosis is poor. The treatment goal is palliative -His bilirubinemia has improved after first cycle chemotherapy. He is tolerating well, lab adequate for treatment, we'll proceed second cycle chemotherapy today. -Giving the wild-type KRAS/NRAS genotype, he would benefit from EGFR targeted therapy. I will add on panitumumab from this cycle. Side effects especially skin rash and infusion reaction were discussed with patient and he  agrees to proceed.  2. HTN, Dilated nonischemic ischemia cardiomyopathy with EF 25% -He is clinically doing well without symptoms of CHF. However this is probably going to impact his chemotherapy. I'll try to avoid cardiotoxic chemotherapy agent and avoid fluid overload during chemotherapy. -Continue follow-up with cardiology.  3. Hepatitis B carrier -Per liver clinic, no need for treatment. Follow-up with liver clinic.  4. Malnutrition -He lost 7 pounds after first cycle chemotherapy -I encouraged him to eat more, and take supplements as needed. -Dietitian consult  Plan -Second cycle chemotherapy today -Return to clinic in 2 weeks  All questions were answered. The patient knows to call the clinic with any problems, questions or concerns.  I spent 20 minutes counseling the patient face to face. The total time spent in the appointment was 30 minutes and more than 50% was on counseling.     Truitt Merle, MD 12/14/2014 10:41 AM

## 2014-12-20 NOTE — Telephone Encounter (Signed)
Neilsa/ Triage took care of this.

## 2014-12-21 ENCOUNTER — Encounter (HOSPITAL_COMMUNITY): Payer: Self-pay

## 2014-12-28 ENCOUNTER — Inpatient Hospital Stay (HOSPITAL_COMMUNITY)
Admission: AD | Admit: 2014-12-28 | Discharge: 2014-12-31 | DRG: 871 | Disposition: A | Discharge status: Home / Self Care | Payer: BLUE CROSS/BLUE SHIELD | Source: Ambulatory Visit | Attending: Internal Medicine | Admitting: Internal Medicine

## 2014-12-28 ENCOUNTER — Inpatient Hospital Stay (HOSPITAL_COMMUNITY): Payer: BLUE CROSS/BLUE SHIELD

## 2014-12-28 ENCOUNTER — Telehealth: Payer: Self-pay | Admitting: *Deleted

## 2014-12-28 ENCOUNTER — Other Ambulatory Visit: Payer: Self-pay | Admitting: *Deleted

## 2014-12-28 ENCOUNTER — Ambulatory Visit: Payer: BLUE CROSS/BLUE SHIELD

## 2014-12-28 ENCOUNTER — Other Ambulatory Visit: Payer: BLUE CROSS/BLUE SHIELD

## 2014-12-28 ENCOUNTER — Encounter (HOSPITAL_COMMUNITY): Payer: Self-pay

## 2014-12-28 ENCOUNTER — Other Ambulatory Visit (HOSPITAL_BASED_OUTPATIENT_CLINIC_OR_DEPARTMENT_OTHER): Payer: BLUE CROSS/BLUE SHIELD

## 2014-12-28 ENCOUNTER — Ambulatory Visit: Payer: BLUE CROSS/BLUE SHIELD | Admitting: Hematology

## 2014-12-28 ENCOUNTER — Encounter: Payer: BLUE CROSS/BLUE SHIELD | Admitting: Nutrition

## 2014-12-28 ENCOUNTER — Encounter: Payer: Self-pay | Admitting: Hematology

## 2014-12-28 ENCOUNTER — Ambulatory Visit (HOSPITAL_BASED_OUTPATIENT_CLINIC_OR_DEPARTMENT_OTHER): Payer: Medicare Other | Admitting: Hematology

## 2014-12-28 VITALS — BP 90/52 | HR 101 | Temp 98.9°F | Resp 18 | Ht 65.0 in | Wt 122.2 lb

## 2014-12-28 DIAGNOSIS — A419 Sepsis, unspecified organism: Principal | ICD-10-CM | POA: Diagnosis present

## 2014-12-28 DIAGNOSIS — E871 Hypo-osmolality and hyponatremia: Secondary | ICD-10-CM | POA: Diagnosis present

## 2014-12-28 DIAGNOSIS — N179 Acute kidney failure, unspecified: Secondary | ICD-10-CM | POA: Diagnosis present

## 2014-12-28 DIAGNOSIS — D630 Anemia in neoplastic disease: Secondary | ICD-10-CM | POA: Diagnosis present

## 2014-12-28 DIAGNOSIS — I10 Essential (primary) hypertension: Secondary | ICD-10-CM | POA: Diagnosis present

## 2014-12-28 DIAGNOSIS — C779 Secondary and unspecified malignant neoplasm of lymph node, unspecified: Secondary | ICD-10-CM | POA: Diagnosis present

## 2014-12-28 DIAGNOSIS — T451X5A Adverse effect of antineoplastic and immunosuppressive drugs, initial encounter: Secondary | ICD-10-CM | POA: Diagnosis present

## 2014-12-28 DIAGNOSIS — C7802 Secondary malignant neoplasm of left lung: Secondary | ICD-10-CM | POA: Diagnosis present

## 2014-12-28 DIAGNOSIS — C787 Secondary malignant neoplasm of liver and intrahepatic bile duct: Secondary | ICD-10-CM

## 2014-12-28 DIAGNOSIS — Z6820 Body mass index (BMI) 20.0-20.9, adult: Secondary | ICD-10-CM

## 2014-12-28 DIAGNOSIS — E860 Dehydration: Secondary | ICD-10-CM | POA: Diagnosis present

## 2014-12-28 DIAGNOSIS — C7801 Secondary malignant neoplasm of right lung: Secondary | ICD-10-CM | POA: Diagnosis present

## 2014-12-28 DIAGNOSIS — D709 Neutropenia, unspecified: Secondary | ICD-10-CM

## 2014-12-28 DIAGNOSIS — C189 Malignant neoplasm of colon, unspecified: Secondary | ICD-10-CM

## 2014-12-28 DIAGNOSIS — C187 Malignant neoplasm of sigmoid colon: Secondary | ICD-10-CM | POA: Diagnosis present

## 2014-12-28 DIAGNOSIS — Z79899 Other long term (current) drug therapy: Secondary | ICD-10-CM | POA: Diagnosis not present

## 2014-12-28 DIAGNOSIS — R627 Adult failure to thrive: Secondary | ICD-10-CM | POA: Diagnosis present

## 2014-12-28 DIAGNOSIS — R5081 Fever presenting with conditions classified elsewhere: Secondary | ICD-10-CM | POA: Diagnosis present

## 2014-12-28 DIAGNOSIS — D899 Disorder involving the immune mechanism, unspecified: Secondary | ICD-10-CM | POA: Diagnosis present

## 2014-12-28 DIAGNOSIS — E1165 Type 2 diabetes mellitus with hyperglycemia: Secondary | ICD-10-CM | POA: Diagnosis present

## 2014-12-28 DIAGNOSIS — R21 Rash and other nonspecific skin eruption: Secondary | ICD-10-CM | POA: Diagnosis present

## 2014-12-28 DIAGNOSIS — R739 Hyperglycemia, unspecified: Secondary | ICD-10-CM | POA: Diagnosis present

## 2014-12-28 DIAGNOSIS — I420 Dilated cardiomyopathy: Secondary | ICD-10-CM | POA: Diagnosis present

## 2014-12-28 DIAGNOSIS — R0600 Dyspnea, unspecified: Secondary | ICD-10-CM

## 2014-12-28 DIAGNOSIS — Z87891 Personal history of nicotine dependence: Secondary | ICD-10-CM | POA: Diagnosis not present

## 2014-12-28 DIAGNOSIS — C7972 Secondary malignant neoplasm of left adrenal gland: Secondary | ICD-10-CM | POA: Diagnosis present

## 2014-12-28 DIAGNOSIS — E43 Unspecified severe protein-calorie malnutrition: Secondary | ICD-10-CM | POA: Diagnosis present

## 2014-12-28 DIAGNOSIS — I5022 Chronic systolic (congestive) heart failure: Secondary | ICD-10-CM | POA: Diagnosis present

## 2014-12-28 DIAGNOSIS — R5383 Other fatigue: Secondary | ICD-10-CM | POA: Diagnosis present

## 2014-12-28 DIAGNOSIS — Z7982 Long term (current) use of aspirin: Secondary | ICD-10-CM | POA: Diagnosis not present

## 2014-12-28 DIAGNOSIS — I255 Ischemic cardiomyopathy: Secondary | ICD-10-CM

## 2014-12-28 DIAGNOSIS — R531 Weakness: Secondary | ICD-10-CM

## 2014-12-28 LAB — URINALYSIS, ROUTINE W REFLEX MICROSCOPIC
GLUCOSE, UA: 100 mg/dL — AB
HGB URINE DIPSTICK: NEGATIVE
Ketones, ur: NEGATIVE mg/dL
Nitrite: NEGATIVE
Protein, ur: NEGATIVE mg/dL
SPECIFIC GRAVITY, URINE: 1.024 (ref 1.005–1.030)
Urobilinogen, UA: 1 mg/dL (ref 0.0–1.0)
pH: 6 (ref 5.0–8.0)

## 2014-12-28 LAB — MRSA PCR SCREENING: MRSA BY PCR: NEGATIVE

## 2014-12-28 LAB — COMPREHENSIVE METABOLIC PANEL (CC13)
ALK PHOS: 213 U/L — AB (ref 40–150)
ALT: 14 U/L (ref 0–55)
ANION GAP: 13 meq/L — AB (ref 3–11)
AST: 26 U/L (ref 5–34)
Albumin: 2.6 g/dL — ABNORMAL LOW (ref 3.5–5.0)
BILIRUBIN TOTAL: 1.29 mg/dL — AB (ref 0.20–1.20)
BUN: 29.3 mg/dL — ABNORMAL HIGH (ref 7.0–26.0)
CO2: 26 mEq/L (ref 22–29)
CREATININE: 1.6 mg/dL — AB (ref 0.7–1.3)
Calcium: 9.1 mg/dL (ref 8.4–10.4)
Chloride: 88 mEq/L — ABNORMAL LOW (ref 98–109)
EGFR: 43 mL/min/{1.73_m2} — ABNORMAL LOW (ref >90)
Glucose: 233 mg/dl — ABNORMAL HIGH (ref 70–140)
POTASSIUM: 4.3 meq/L (ref 3.5–5.1)
Sodium: 127 mEq/L — ABNORMAL LOW (ref 136–145)
Total Protein: 7.1 g/dL (ref 6.4–8.3)

## 2014-12-28 LAB — CBC WITH DIFFERENTIAL/PLATELET
BASO%: 0.9 % (ref 0.0–2.0)
Basophils Absolute: 0 10*3/uL (ref 0.0–0.1)
EOS%: 0.3 % (ref 0.0–7.0)
Eosinophils Absolute: 0 10*3/uL (ref 0.0–0.5)
HEMATOCRIT: 38.4 % (ref 38.4–49.9)
HEMOGLOBIN: 13.4 g/dL (ref 13.0–17.1)
LYMPH%: 53.1 % — AB (ref 14.0–49.0)
MCH: 29.5 pg (ref 27.2–33.4)
MCHC: 34.9 g/dL (ref 32.0–36.0)
MCV: 84.6 fL (ref 79.3–98.0)
MONO#: 1.2 10*3/uL — ABNORMAL HIGH (ref 0.1–0.9)
MONO%: 35.1 % — ABNORMAL HIGH (ref 0.0–14.0)
NEUT%: 10.6 % — AB (ref 39.0–75.0)
NEUTROS ABS: 0.4 10*3/uL — AB (ref 1.5–6.5)
Platelets: 233 10*3/uL (ref 140–400)
RBC: 4.54 10*6/uL (ref 4.20–5.82)
RDW: 14.7 % — ABNORMAL HIGH (ref 11.0–14.6)
WBC: 3.5 10*3/uL — ABNORMAL LOW (ref 4.0–10.3)
lymph#: 1.9 10*3/uL (ref 0.9–3.3)

## 2014-12-28 LAB — TSH: TSH: 0.355 u[IU]/mL (ref 0.350–4.500)

## 2014-12-28 LAB — PROCALCITONIN: PROCALCITONIN: 0.44 ng/mL

## 2014-12-28 LAB — PROTIME-INR
INR: 1.28 (ref 0.00–1.49)
PROTHROMBIN TIME: 16.1 s — AB (ref 11.6–15.2)

## 2014-12-28 LAB — URINE MICROSCOPIC-ADD ON

## 2014-12-28 LAB — LACTIC ACID, PLASMA: LACTIC ACID, VENOUS: 1.7 mmol/L (ref 0.5–2.0)

## 2014-12-28 LAB — APTT: aPTT: 31 seconds (ref 24–37)

## 2014-12-28 MED ORDER — VANCOMYCIN HCL IN DEXTROSE 1-5 GM/200ML-% IV SOLN
1000.0000 mg | Freq: Once | INTRAVENOUS | Status: AC
  Administered 2014-12-29: 1000 mg via INTRAVENOUS
  Filled 2014-12-28: qty 200

## 2014-12-28 MED ORDER — PIPERACILLIN-TAZOBACTAM 3.375 G IVPB
3.3750 g | Freq: Three times a day (TID) | INTRAVENOUS | Status: DC
  Administered 2014-12-29 – 2014-12-31 (×7): 3.375 g via INTRAVENOUS
  Filled 2014-12-28 (×8): qty 50

## 2014-12-28 MED ORDER — VANCOMYCIN HCL IN DEXTROSE 750-5 MG/150ML-% IV SOLN
750.0000 mg | INTRAVENOUS | Status: DC
  Filled 2014-12-28: qty 150

## 2014-12-28 MED ORDER — SODIUM CHLORIDE 0.9 % IV SOLN
Freq: Once | INTRAVENOUS | Status: AC
  Administered 2014-12-28: 13:00:00 via INTRAVENOUS

## 2014-12-28 MED ORDER — MORPHINE SULFATE 2 MG/ML IJ SOLN: 1.0000 mg | INTRAMUSCULAR | Status: DC | PRN

## 2014-12-28 MED ORDER — ONDANSETRON HCL 4 MG PO TABS: 4.0000 mg | ORAL_TABLET | Freq: Four times a day (QID) | ORAL | Status: DC | PRN

## 2014-12-28 MED ORDER — PIPERACILLIN-TAZOBACTAM 3.375 G IVPB
3.3750 g | Freq: Once | INTRAVENOUS | Status: DC
  Administered 2014-12-28: 3.375 g via INTRAVENOUS
  Filled 2014-12-28 (×2): qty 50

## 2014-12-28 MED ORDER — HYDROCODONE-ACETAMINOPHEN 5-325 MG PO TABS: 1.0000 | ORAL_TABLET | ORAL | Status: DC | PRN

## 2014-12-28 MED ORDER — ENOXAPARIN SODIUM 40 MG/0.4ML ~~LOC~~ SOLN
40.0000 mg | SUBCUTANEOUS | Status: DC
  Administered 2014-12-29 – 2014-12-30 (×3): 40 mg via SUBCUTANEOUS
  Filled 2014-12-28 (×3): qty 0.4

## 2014-12-28 MED ORDER — SODIUM CHLORIDE 0.9 % IV BOLUS (SEPSIS)
500.0000 mL | Freq: Once | INTRAVENOUS | Status: AC
  Administered 2014-12-28: 500 mL via INTRAVENOUS

## 2014-12-28 MED ORDER — ONDANSETRON HCL 4 MG/2ML IJ SOLN: 4.0000 mg | Freq: Four times a day (QID) | INTRAMUSCULAR | Status: DC | PRN

## 2014-12-28 MED ORDER — INSULIN ASPART 100 UNIT/ML ~~LOC~~ SOLN
0.0000 [IU] | Freq: Three times a day (TID) | SUBCUTANEOUS | Status: DC
  Administered 2014-12-29: 2 [IU] via SUBCUTANEOUS

## 2014-12-28 MED ORDER — SODIUM CHLORIDE 0.9 % IV SOLN
INTRAVENOUS | Status: DC
  Administered 2014-12-28 – 2014-12-29 (×3): via INTRAVENOUS

## 2014-12-28 NOTE — Progress Notes (Signed)
ANTIBIOTIC CONSULT NOTE - INITIAL  Pharmacy Consult for Vancomycin and Zosyn Indication: Sepsis  No Known Allergies  Patient Measurements: Height: 5\' 5"  (165.1 cm) Weight: 120 lb 13 oz (54.8 kg) IBW/kg (Calculated) : 61.5  Vital Signs: Temp: 98.6 F (37 C) (03/02 1943) Temp Source: Oral (03/02 1943) BP: 94/78 mmHg (03/02 1930) Pulse Rate: 98 (03/02 1941) Intake/Output from previous day:   Intake/Output from this shift:    Labs:  Recent Labs  12/28/14 1105 12/28/14 1106  WBC 3.5*  --   HGB 13.4  --   PLT 233  --   CREATININE  --  1.6*   Estimated Creatinine Clearance: 35.2 mL/min (by C-G formula based on Cr of 1.6). No results for input(s): VANCOTROUGH, VANCOPEAK, VANCORANDOM, GENTTROUGH, GENTPEAK, GENTRANDOM, TOBRATROUGH, TOBRAPEAK, TOBRARND, AMIKACINPEAK, AMIKACINTROU, AMIKACIN in the last 72 hours.   Microbiology: Recent Results (from the past 720 hour(s))  MRSA PCR Screening     Status: None   Collection Time: 12/28/14  2:25 PM  Result Value Ref Range Status   MRSA by PCR NEGATIVE NEGATIVE Final    Comment:        The GeneXpert MRSA Assay (FDA approved for NASAL specimens only), is one component of a comprehensive MRSA colonization surveillance program. It is not intended to diagnose MRSA infection nor to guide or monitor treatment for MRSA infections.     Medical History: Past Medical History  Diagnosis Date  . Tachycardia   . Abnormal EKG   . Hypertension   . Non-ischemic cardiomyopathy   . Hepatitis B     Medications:  Scheduled:  . enoxaparin (LOVENOX) injection  40 mg Subcutaneous Q24H  . [START ON 12/29/2014] insulin aspart  0-9 Units Subcutaneous TID WC  . piperacillin-tazobactam  3.375 g Intravenous Once  . vancomycin  1,000 mg Intravenous Once   Infusions:  . sodium chloride 100 mL/hr at 12/28/14 1737   PRN: HYDROcodone-acetaminophen, morphine injection, ondansetron **OR** ondansetron (ZOFRAN) IV  Assessment: 69 yo male with  metastatic colon cancer [facility] with fatigue and rash presumably from panitumumab. Labs reveal ARF, pt is hypotensive and tachycardic. Pharmacy is consulted to dose vancomycin and zosyn for sepsis.   3/2 >> Vancomycin >> 3/2 >> Zosyn >>  Tmax: Afebrile WBC: 3.5k (ANC 0.4) Renal: SCr 1.6 (baseline 0.8), CrCl 35 ml/min CG  3/2 blood: 3/2 urine:  Goal of Therapy:  Vancomycin trough level 15-20 mcg/ml  Zosyn dose appropriate for renal function  Plan:   Vancomycin 1g IV ordered per MD, continue with 750mg  IV q24h starting 3/3 Check trough at steady state Zosyn 3.375gm IV q8h (4hr extended infusions) Follow up renal function & cultures, clinical course  Peggyann Juba, PharmD, BCPS Pager: [phone] 12/28/2014,7:53 PM

## 2014-12-28 NOTE — H&P (Addendum)
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 Please note that history is limited due to difficulty with [place], interpreter not available at the time of the admission. Wife who was at bedside does not speak [place]. Pt has seen his oncologist [name] earlier in the day and she has asked [facility] to have pt admitted as his blood tests were notable for acute renal failure Cr 1.6, BP in low 90's/60's with HR in 110's. Pt did report he has broken out in rash several days ago   Pt was scheduled for chemo today but his son called the office saying pt feels very tired, has not been eating or drinking much over the past weeks. Son reported that pt has los over 20 lbs in the past 3 months. CT ABD 07/22/2014 revealed multiple large partially calcified hepatic masses consistent with metastatic disease, retroperitoneal adenopathy with the largest node measuring 1.6 cm, nonspecific 1.4 cm left adrenal nodule. His tumor marker showed CEA > 10,000, CA 19-9 12,929, AFP 3.2 (normal).   Assessment and Plan: Principal Problem:   Weakness - secondary to metastatic colon cancer, chemotherapy, failure to thrive, dehydration, ? Sepsis  - admit to SDU - place on IVF with cautious hydration due to low EF and cardiomyopathy - will eventually need PT/OT evaluation once more medically stable  Active Problems:   ? Sepsis - criteria for sepsis met on admission - BP 73/31, RR 22, HR 113, T 99.61F in  immunocompromised pt on chemotx, WBC 3.5  - UA with trace leukocytes  - sepsis order set in place - will cover broadly as source is not clear at this time - CXR requested  - blood culture and urine culture requested  - holding all BP medications pt takes at home until BP stabilizes    Leukopenia - from chemo - monitor with daily CBC with diff   Dilated cardiomyopathy - with apparent EF 25 % - 2 D ECHO requested  - check daily weights, strict I's and O's - no ACEI/ARB due to renal failure and hypotension    Metastatic colon cancer to liver - per oncology team    Acute renal failure - secondary to pre renal etiology from poor oral intake - IVF to be provided and repeat BMP in AM - stop Lisinopril - HCTZ that pt takes at home    Hyponatremia - also pre renal in etiology, will check BMP in AM to see if responding to IVF    Hyperglycemia - pt has not been eating but CBG is 200's - will place on SSI and check A1C   Rash - possibly from chemo side effect - pt was advised to avoid sun exposure but apparently has not followed recommendations - supportive care as needed    Severe PCM - in the setting of acute on chronic illness - advance diet as pt able to tolerate    DVT prophylaxis - Lovenox SQ  Radiological Exams on Admission: No results  found.  Code Status: Full Family Communication: Pt and wife at bedside  Disposition Plan: Admit for further evaluation    [facility] [phone]   Review of Systems:  Constitutional:Negative for diaphoresis.  HENT: Negative for hearing loss, ear pain, tinnitus and ear discharge.   Eyes: Negative for blurred vision, double vision, photophobia, pain, discharge and redness.  Respiratory: Negative for cough, wheezing and stridor.   Cardiovascular: Negative for orthopnea, claudication and leg swelling.  Gastrointestinal: Negative for nausea, vomiting and abdominal pain Genitourinary: Negative for hematuria and flank pain.   Musculoskeletal: Negative for  joint pain and falls.  Skin: New facial rash  Neurological: Negative for tingling, tremors, sensory change, speech change Endo/Heme/Allergies: Negative for environmental allergies and polydipsia. Does not bruise/bleed easily.  Psychiatric/Behavioral: Negative for suicidal ideas. The patient is not nervous/anxious.     Past Medical History  Diagnosis Date  . Tachycardia   . Abnormal EKG   . Hypertension   . Non-ischemic cardiomyopathy   . Hepatitis B     Past Surgical History  Procedure Laterality Date  . Nuclear stress test  03/03/2013    High risk - consistent with nonischemic cardiomyopathy  . Left and right heart catheterization with coronary angiogram N/A 03/29/2013    Procedure: LEFT AND RIGHT HEART CATHETERIZATION WITH CORONARY ANGIOGRAM;  Surgeon: Pixie Casino, MD;  Location: [facility] CATH LAB;  Service: Cardiovascular;  Laterality: N/A;    Social History:  reports that he has quit smoking. He does not have any smokeless tobacco history on file. He reports that he drinks alcohol. He reports that he does not use illicit drugs.  No Known Allergies  Pt reports no family history of HTN, HLD, or DM  Medication Sig  aspirin EC 81 MG tablet Take 1 tablet (81 mg total) by mouth daily.  lisinopril-hydrochlorothiazide (PRINZIDE,ZESTORETIC) 10-12.5 MG per tablet Take 1 tablet by mouth daily.  metoprolol (LOPRESSOR) 50 MG tablet TAKE 1 TABLET BY MOUTH TWICE A DAY  metoprolol-hydrochlorothiazide (LOPRESSOR HCT) 100-25 MG per tablet Take 1 tablet by mouth daily.  mirtazapine (REMERON) 15 MG tablet Take 1 tablet (15 mg total) by mouth at bedtime.  ondansetron (ZOFRAN) 8 MG tablet Take 1 tablet (8 mg total) by mouth every 8 (eight) hours as needed for nausea or vomiting.    Physical Exam: Filed Vitals:   12/28/14 1445  BP: 82/54  Pulse: 102  Resp: 17  Height: '5\' 5"'  (1.651 m)  Weight: 54.8 kg (120 lb 13 oz)  SpO2: 98%    Physical Exam   Constitutional: Appears tired and frail, NAD HENT: Normocephalic. External right and left ear normal. Dry MM Eyes: Conjunctivae and EOM are normal. PERRLA, no scleral icterus.  Neck: Normal ROM. Neck supple. No JVD. No tracheal deviation. No thyromegaly.  CVS: Regular rhythm, tachycardic, S1/S2 +, no murmurs, no gallops, no carotid bruit.  Pulmonary: Effort and breath sounds normal, no stridor, rhonchi, diminished breath sounds at bases  Abdominal: Soft. BS +,  no distension, tenderness, rebound or guarding.  Musculoskeletal: Normal range of motion, no tenderness.  Lymphadenopathy: No lymphadenopathy noted, cervical, inguinal. Neuro: Alert. Normal reflexes, muscle tone coordination. No cranial nerve deficit. Skin: Diffuse facial macular rash  Psychiatric: Normal mood and affect.  Labs on Admission:  Basic Metabolic Panel:  Recent Labs Lab 12/28/14 1106  NA 127*  K 4.3  CO2 26  GLUCOSE 233*  BUN 29.3*  CREATININE 1.6*  CALCIUM 9.1   Liver Function Tests:  Recent Labs Lab 12/28/14  1106  AST 26  ALT 14  ALKPHOS 213*  BILITOT 1.29*  PROT 7.1  ALBUMIN 2.6*   CBC:  Recent Labs Lab 12/28/14 1105  WBC 3.5*  NEUTROABS 0.4*  HGB 13.4  HCT 38.4  MCV 84.6  PLT 233    EKG: Normal sinus rhythm, no ST/T wave changes   If 7PM-7AM, please contact night-coverage [email or web address] Password [email or web address] 12/28/2014, 3:01 PM

## 2014-12-28 NOTE — Progress Notes (Signed)
Patient speaks [name] if patient needs a Optometrist

## 2014-12-28 NOTE — Telephone Encounter (Signed)
Was informed by infusion room charge nurse that pt was a No Show for chemo today.  Spoke with son at home and was informed that pt was not feeling well today, and wanted a week rest from chemo.  Son stated pt was feeling tired,  Has lower back pain, and feeling slightly weak today.  [name] notified.   Spoke with son again.  Instructed son to bring pt in at 15 lab, and to see [name] at  1100, and for possible IVF after visit .  Son voiced understanding.

## 2014-12-28 NOTE — Progress Notes (Signed)
Vital signs stable; patient discharged to the ED by Elray Buba, RN in wheelchair.  Patient's wife is with patient. No signs of distress noted.

## 2014-12-28 NOTE — Progress Notes (Signed)
[place]  [phone] Fax:([phone]) [place] Note   Patient Care Team: [name], MD as PCP - General (Specialist) [name], CRNP as Nurse Practitioner (Nurse Practitioner) 12/28/2014  CHIEF COMPLAINTS Metastatic colon cancer    Metastatic colon cancer to liver   10/28/2014 Tumor Marker AFP 3.2 CEA > 10,000 CA 19-9 12,929.6. tumor (-) KRAS and NRAS mutation.    11/10/2014 Imaging PET scan showed hypermetabolic mass in the sigmoid colon was noted metastasis in the retroperitoneum. Probable left adrenal and pulmonary metastasis, and diffuse liver metastasis.   11/21/2014 Initial Diagnosis Metastatic colon cancer to liver, lung, abd nodes and left adrenal gland. Diagnosis was made by liver biopsy.    11/30/2014 -  Chemotherapy First line chemo mFOLFOX6, Panitumumab added from second cycle    CURRENT THERAPY: mFOLFOX6 started on 11/30/2014, Panitumumab added from second cycle on 12/14/14  HISTORY OF PRESENTING ILLNESS:  Jason Reilly 69 y.o. male is here because of abnormal CT findings, which is very suspicious for malignancy. He is on ranitidine from [place], has been on in the [place] for 16 years. He came in with his son and an interpreter.  He has been feeling fatigued since two month ago. He is still able to do all ADLs. He otherwise denies any pain, bloating or nausea.  He lost about 20lbs in 3 month. His appetite is lower than before, eats less, no change of his bowl habits.  She denied any hematochezia or melana. Per his son, he has had some personality changes daily, irritable, slightly confused some time.  He was evaluated by his primary care physician. Lab test reviewed hepatitis B infection, which he did not know before, and elevated alkaline phosphatase, his liver function and the rest of the liver function was not remarkable. [place] of abdomen was obtained on 07/22/2014, which showed diffusely abnormal liver with multiple echogenic lesions. CT  of abdomen with and without contrast was done on 08/26/2014, which reviewed here at a medically with multiple large partially calcified hepatic masses consistent with metastatic disease. Mild retroperitoneal adenopathy with the largest node measuring 1.6 cm. And nonspecific 1.4 cm left adrenal nodule was also noticed. His tumor marker showed CEA greater than 10,000, CA 19-9 12,929, AFP 3.2 (normal). He was referred to [facility] and was evaluated by nurse practitioner [name]. Treatment for hepatitis B was not recommended based on his virus load.  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 He was evaluated by a cardiologist in 2014. He denies any significant dyspnea on exertion. No leg swollen.  INTERIM HISTORY: Jason Reilly called to cancel her appointment and chemotherapy this morning. He has not been feeling well since the prior cycle of chemotherapy. I asked him to come in for evaluation. He broke out of rash on face and trunk last week, I did warn him about the side effect from Panitumumab and asked him to avoid sun exposure but he did not follow. He has been feeling fatigued, no appetite, eating and drinking very little in the past week, especially in the past to 3 days. He has no energy to do much yesterday and today. He denies any chills or fever at home. No cough, diarrhea, abdominal pain, or significant nausea.   MEDICAL HISTORY:  Past Medical History  Diagnosis Date  . Tachycardia   . Abnormal EKG   . Hypertension   . Non-ischemic cardiomyopathy   . Hepatitis B  SURGICAL HISTORY: Past Surgical History  Procedure Laterality Date  . Nuclear stress test  03/03/2013    High risk - consistent with nonischemic cardiomyopathy  . Left and right heart catheterization with coronary angiogram N/A 03/29/2013    Procedure: LEFT AND RIGHT HEART CATHETERIZATION WITH CORONARY ANGIOGRAM;  Surgeon: Pixie Casino, MD;  Location: [facility]  CATH LAB;  Service: Cardiovascular;  Laterality: N/A;    SOCIAL HISTORY: History   Social History  . Marital Status: Unknown    Spouse Name: N/A  . Number of Children: N/A  . Years of Education: N/A   Occupational History  . Not on file.   Social History Main Topics  . Smoking status: Former Research scientist (life sciences)  . Smokeless tobacco: Not on file  . Alcohol Use: Yes     Comment: occasional  . Drug Use: No  . Sexual Activity: Not on file   Other Topics Concern  . Not on file   Social History Narrative    FAMILY HISTORY: No family history of liver disease or malignancy.  ALLERGIES:  has No Known Allergies.  MEDICATIONS:  Current Outpatient Prescriptions  Medication Sig Dispense Refill  . aspirin EC 81 MG tablet Take 1 tablet (81 mg total) by mouth daily. (Patient not taking: Reported on 11/02/2014) 90 tablet 3  . lisinopril-hydrochlorothiazide (PRINZIDE,ZESTORETIC) 10-12.5 MG per tablet TAKE 1 TABLET BY MOUTH EVERY DAY (Patient not taking: Reported on 11/02/2014) 90 tablet 3  . metoprolol (LOPRESSOR) 50 MG tablet TAKE 1 TABLET BY MOUTH TWICE A DAY (Patient not taking: Reported on 11/02/2014) 180 tablet 3  . metoprolol succinate (TOPROL-XL) 50 MG 24 hr tablet Take 50 mg by mouth 2 (two) times daily. Take with or immediately following a meal.    .        No current facility-administered medications for this visit.    REVIEW OF SYSTEMS:   Constitutional: Denies fevers, chills or abnormal night sweats, (+) fatigue  Eyes: Denies blurriness of vision, double vision or watery eyes Ears, nose, mouth, throat, and face: Denies mucositis or sore throat Respiratory: Denies cough, dyspnea or wheezes Cardiovascular: Denies palpitation, chest discomfort or lower extremity swelling Gastrointestinal: (+) anorexia.   Denies nausea, heartburn or change in bowel habits Skin: Denies abnormal skin rashes Lymphatics: Denies new lymphadenopathy or easy bruising Neurological:Denies numbness, tingling or new  weaknesses Behavioral/Psych: Mood is stable, no new changes, (+) insomnia All other systems were reviewed with the patient and are negative.  PHYSICAL EXAMINATION: BP 90/52 mmHg  Pulse 101  Temp(Src) 98.9 F (37.2 C) (Oral)  Resp 18  Ht 5' 5" (1.651 m)  Wt 122 lb 3.2 oz (55.43 kg)  BMI 20.34 kg/m2  SpO2 100%  ECOG PERFORMANCE STATUS: 3 Vital sign were taken at in the infusion room, within normal limits. GENERAL:alert, no distress and comfortable SKIN: (+) Dry skin, diffuse skin rash on the face, neck and trunk, with some scaling, no skin ulcer or discharge. EYES: normal, conjunctiva are pink and non-injected, sclera clear OROPHARYNX:no exudate, no erythema and lips, buccal mucosa, and tongue normal  NECK: supple, thyroid normal size, non-tender, without nodularity LYMPH:  no palpable lymphadenopathy in the cervical, axillary or inguinal LUNGS: clear to auscultation and percussion with normal breathing effort HEART: regular rate & rhythm and no murmurs and no lower extremity edema ABDOMEN:abdomen soft, non-tender, (+) hepatomegaly, liver is palpable 3.5 cm under rib cage, soft nontender, no splenomegaly and normal bowel sounds Musculoskeletal:no cyanosis of digits and no clubbing  PSYCH: alert &  oriented x 3 with fluent speech NEURO: no focal motor/sensory deficits  LABORATORY DATA:  I have reviewed the data as listed Lab Results  Component Value Date   WBC 3.5* 12/28/2014   HGB 13.4 12/28/2014   HCT 38.4 12/28/2014   MCV 84.6 12/28/2014   PLT 233 12/28/2014    Recent Labs  11/30/14 1054 12/14/14 0907 12/28/14 1106  NA 133* 136 127*  K 4.3 5.7* 4.3  CO2 28 30* 26  GLUCOSE 216* 141* 233*  BUN 25.1 12.6 29.3*  CREATININE 1.2 1.0 1.6*  CALCIUM 9.2 10.3 9.1  PROT 8.2 8.3 7.1  ALBUMIN 2.8* 3.2* 2.6*  AST 84* 51* 26  ALT 32 29 14  ALKPHOS 665* 394* 213*  BILITOT 1.64* 0.79 1.29*   AFP 3.2 CEA > 10,000 CA 19-9 12,929.6  Pathology report  Liver, needle/core  biopsy - METASTATIC ADENOCARCINOMA, SEE COMMENT. Microscopic Comment The adenocarcinoma demonstrates the following immunophenotype: Cytokeratin 7 - negative expression. Cytokeratin 20 - strong diffuse expression. CD2 - strong diffuse expression. Overall the morphology and immunophenotype are that of metastatic adenocarcinoma primary to colorectum. The recent nuclear medicine scan demonstrating sigmoid mass with associated liver masses is noted.      RADIOGRAPHIC STUDIES: I have personally reviewed the outside CT scan image with patient and his son.   PET 11/10/2014 IMPRESSION: 1. 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 Probable left adrenal and pulmonary metastasis. 2. Widespread hepatic metastasis. Favor secondary to colon primary. The liver does have a somewhat atypical morphology, favored to be related to the extent of metastatic disease. Cirrhosis with synchronous multifocal hepatocellular carcinoma felt less likely.   ASSESSMENT & PLAN:  68-year-old [place] male, with past history of hypertension and dilated nonischemic gammopathy with EF 25%, no clinical signs of heart failure, who was found to have hepatitis B infection lately, and multiple liver lesions on the CT scan. He has extremely high CEA and CA 19-9 levels. PET scan reviewed a hypermetabolic sigmoid colon mass, diffuse liver metastasis, probable lung and adrenal gland metastasis.  1. Metastatic sigmoid colon cancer, with diffuse liver, lungs, node and left adrenal gland metastases. KRAS/NRAS wild type  -Liver biopsy showed metastatic adenocarcinoma. His tumor were strongly positive for CK20 and CD2, consistent with primary colorectal primary. KRAS and NRAS mutations were not detected.  -Pt understands that this is incurable cancer, and he has very high disease burden and overall prognosis is poor. The treatment goal is  palliative -His bilirubinemia has improved after first cycle chemotherapy.  -Giving the wild-type KRAS/NRAS genotype, he would benefit from EGFR targeted therapy. He started panitumumab from second cycle chemo. Side effects especially skin rash and infusion reaction were discussed with patient and he agreed to proceed. -Hold chemotherapy today, due to his dehydration, acute renal failure, severe skin rash.  2. Dehydration and acute renal failure -Likely related to his poor by mouth intake after chemotherapy. -He was hypotensive on arrival to my clinic today , and repeated the blood pressure systolic in mid 90s. I started him on IV fluids  -We'll admit him to hospital, I spoke with the on-call hospitalist Dr. Santino  -We'll get a blood culture to ruled out infection with her. His neutropenic, temperature 90.9 in my office.   3. Grade 2 skin rash  -Secondary to panitumumab -Hydrocortisone and clindamycin gel topical  -So far no signs of your infection.  -We'll consider decreasing dose with next cycle   4.   Neutropenia without fever -We'll consider Neupogen -Blood culture, UA -IV antibiotics if she he spikes fever or positive ID work up   5. HTN, Dilated nonischemic ischemia cardiomyopathy with EF 25% -He is clinically doing well without symptoms of CHF. However this is probably going to impact his chemotherapy. I'll try to avoid cardiotoxic chemotherapy agent and avoid fluid overload during chemotherapy. -Continue follow-up with cardiology.  6. Hepatitis B carrier -Per liver clinic, no need for treatment. Follow-up with liver clinic.  7. Malnutrition -I encouraged him to eat more, and take supplements as needed. -Dietitian consult  Plan -Hold chemotherapy today  -Admit him to [facility] today for IV fluids, ID workup, and nutritional support -Need to watch his intake and output closely in the hospital given his significant history of a cardiomyopathy   All questions were  answered. The patient knows to call the clinic with any problems, questions or concerns.  I spent 40 minutes counseling the patient face to face. The total time spent in the appointment was 60 minutes and more than 50% was on counseling.     Truitt Merle, MD 12/28/2014   3:15 PM

## 2014-12-29 DIAGNOSIS — C7800 Secondary malignant neoplasm of unspecified lung: Secondary | ICD-10-CM

## 2014-12-29 DIAGNOSIS — D703 Neutropenia due to infection: Secondary | ICD-10-CM

## 2014-12-29 DIAGNOSIS — N179 Acute kidney failure, unspecified: Secondary | ICD-10-CM

## 2014-12-29 DIAGNOSIS — A419 Sepsis, unspecified organism: Principal | ICD-10-CM

## 2014-12-29 DIAGNOSIS — I9589 Other hypotension: Secondary | ICD-10-CM

## 2014-12-29 DIAGNOSIS — I5020 Unspecified systolic (congestive) heart failure: Secondary | ICD-10-CM

## 2014-12-29 DIAGNOSIS — E860 Dehydration: Secondary | ICD-10-CM

## 2014-12-29 DIAGNOSIS — R5081 Fever presenting with conditions classified elsewhere: Secondary | ICD-10-CM

## 2014-12-29 DIAGNOSIS — C7972 Secondary malignant neoplasm of left adrenal gland: Secondary | ICD-10-CM

## 2014-12-29 DIAGNOSIS — C787 Secondary malignant neoplasm of liver and intrahepatic bile duct: Secondary | ICD-10-CM

## 2014-12-29 DIAGNOSIS — C187 Malignant neoplasm of sigmoid colon: Secondary | ICD-10-CM

## 2014-12-29 LAB — BASIC METABOLIC PANEL
Anion gap: 4 — ABNORMAL LOW (ref 5–15)
BUN: 20 mg/dL (ref 6–23)
CO2: 26 mmol/L (ref 19–32)
Calcium: 7.6 mg/dL — ABNORMAL LOW (ref 8.4–10.5)
Chloride: 96 mmol/L (ref 96–112)
Creatinine, Ser: 0.94 mg/dL (ref 0.50–1.35)
GFR, EST NON AFRICAN AMERICAN: 85 mL/min — AB (ref >90)
GLUCOSE: 123 mg/dL — AB (ref 70–99)
POTASSIUM: 3.5 mmol/L (ref 3.5–5.1)
Sodium: 126 mmol/L — ABNORMAL LOW (ref 135–145)

## 2014-12-29 LAB — GLUCOSE, CAPILLARY
GLUCOSE-CAPILLARY: 109 mg/dL — AB (ref 70–99)
GLUCOSE-CAPILLARY: 151 mg/dL — AB (ref 70–99)
Glucose-Capillary: 107 mg/dL — ABNORMAL HIGH (ref 70–99)
Glucose-Capillary: 124 mg/dL — ABNORMAL HIGH (ref 70–99)
Glucose-Capillary: 169 mg/dL — ABNORMAL HIGH (ref 70–99)

## 2014-12-29 LAB — HEMOGLOBIN A1C
HEMOGLOBIN A1C: 8.2 % — AB (ref 4.8–5.6)
Mean Plasma Glucose: 189 mg/dL

## 2014-12-29 LAB — LACTIC ACID, PLASMA
LACTIC ACID, VENOUS: 1 mmol/L (ref 0.5–2.0)
LACTIC ACID, VENOUS: 2 mmol/L (ref 0.5–2.0)
Lactic Acid, Venous: 2.2 mmol/L (ref 0.5–2.0)

## 2014-12-29 LAB — CBC
HCT: 32.2 % — ABNORMAL LOW (ref 39.0–52.0)
HEMOGLOBIN: 10.8 g/dL — AB (ref 13.0–17.0)
MCH: 28.3 pg (ref 26.0–34.0)
MCHC: 33.5 g/dL (ref 30.0–36.0)
MCV: 84.3 fL (ref 78.0–100.0)
Platelets: 162 10*3/uL (ref 150–400)
RBC: 3.82 MIL/uL — AB (ref 4.22–5.81)
RDW: 14.7 % (ref 11.5–15.5)
WBC: 3 10*3/uL — ABNORMAL LOW (ref 4.0–10.5)

## 2014-12-29 MED ORDER — SODIUM CHLORIDE 0.9 % IJ SOLN
10.0000 mL | INTRAMUSCULAR | Status: DC | PRN
  Administered 2014-12-29 – 2014-12-30 (×2): 10 mL
  Administered 2014-12-31: 30 mL
  Administered 2014-12-31: 10 mL
  Filled 2014-12-29 (×3): qty 40

## 2014-12-29 MED ORDER — ACETAMINOPHEN 325 MG PO TABS
650.0000 mg | ORAL_TABLET | Freq: Four times a day (QID) | ORAL | Status: DC | PRN
  Administered 2014-12-29: 650 mg via ORAL
  Filled 2014-12-29: qty 2

## 2014-12-29 MED ORDER — TBO-FILGRASTIM 300 MCG/0.5ML ~~LOC~~ SOSY
300.0000 ug | PREFILLED_SYRINGE | Freq: Once | SUBCUTANEOUS | Status: AC
  Administered 2014-12-29: 300 ug via SUBCUTANEOUS
  Filled 2014-12-29: qty 0.5

## 2014-12-29 MED ORDER — VANCOMYCIN HCL IN DEXTROSE 750-5 MG/150ML-% IV SOLN
750.0000 mg | Freq: Two times a day (BID) | INTRAVENOUS | Status: DC
  Administered 2014-12-29 – 2014-12-31 (×4): 750 mg via INTRAVENOUS
  Filled 2014-12-29 (×5): qty 150

## 2014-12-29 MED ORDER — SODIUM CHLORIDE 0.9 % IV BOLUS (SEPSIS)
500.0000 mL | Freq: Once | INTRAVENOUS | Status: AC
  Administered 2014-12-29: 500 mL via INTRAVENOUS

## 2014-12-29 NOTE — Progress Notes (Addendum)
Inpatient Diabetes Program Recommendations  AACE/ADA: New Consensus Statement on Inpatient Glycemic Control (2013)  Target Ranges:  Prepandial:   less than 140 mg/dL      Peak postprandial:   less than 180 mg/dL (1-2 hours)      Critically ill patients:  140 - 180 mg/dL    Results for Jason Reilly, Jason Reilly (MRN [id number]) as of 12/29/2014 13:40  Ref. Range 12/29/2014 00:35 12/29/2014 03:29  Glucose-Capillary Latest Range: 70-99 mg/dL 109 (H) 169 (H)    Results for Jason Reilly, Jason Reilly (MRN [id number]) as of 12/29/2014 13:40  Ref. Range 12/28/2014 17:43  Hemoglobin A1C Latest Range: 4.8-5.6 % 8.2 (H)    Chief Complaint: Weakness/Fatigue  History: HTN, Cardiomyopathy, Hepatitis B, Colon Cancer with Mets to Liver getting Chemotherapy (No History of DM mentioned in H&P)  Current Insulin Orders: Novolog Sensitive SSI    **Received referral for this patient from Dr. Doyle Askew.  New diagnosis of DM as evidenced by A1c of 8.2%.  **Note Dr. Doyle Askew would prefer to send patient home on an oral DM medication regimen for simplicity.  **Will attempt to schedule a [name] interpreter for tomorrow to speak with patient and family about new diagnosis.  I think the goal of care for patient's elevated blood sugars should focus on safety and not driving patient's blood sugars too low.  Patient is already having issues with his appetite and I think it would be in patient's best interest to possibly be somewhat liberal in blood sugar goals and nutrition guidelines.    MD- When beginning d/c planning, may want to think about starting a DPP-4 inhibitor medication like Tradjenta 5 mg daily  Tradjenta does not have to be renally dosed and operates in a glucose dependent fashion.  It works more when glucose levels are elevated and works less when glucose levels are low.  Lady [place] has a lower risk for Hypoglycemia at home.  Patient has insurance and this medication would likely be covered.  Would Not recommend Metformin at this time  since Metformin acts predominantly on the liver and patient has metastatic cancer to the liver from the colon.     Will follow Wyn Quaker RN, MSN, CDE Diabetes Coordinator Inpatient Diabetes Program Team Pager: [phone] (8a-10p)

## 2014-12-29 NOTE — Progress Notes (Signed)
CARE MANAGEMENT NOTE 12/29/2014  Patient:  Jason Reilly,Jason Reilly   Account Number:  [id number]  Date Initiated:  12/29/2014  Documentation initiated by:  Amaar Oshita  Subjective/Objective Assessment:   sepsis with hypotension     Action/Plan:   home when stable   Anticipated DC Date:  01/01/2015   Anticipated DC Plan:  HOME/SELF CARE  In-house referral  Clinical Social Worker  Interpreting Services      DC Planning Services  CM consult      [facility] Choice  NA   Choice offered to / List presented to:  NA   DME arranged  NA      DME agency  NA     [place] arranged  NA      [place] agency  NA   Status of service:  In process, will continue to follow Medicare Important Message given?   (If response is "NO", the following Medicare IM given date fields will be blank) Date Medicare IM given:   Medicare IM given by:   Date Additional Medicare IM given:   Additional Medicare IM given by:    Discharge Disposition:    Per UR Regulation:  Reviewed for med. necessity/level of care/duration of stay  If discussed at [place] of Stay Meetings, dates discussed:    Comments:  December 29, 2014/Lupe Handley L. Rosana Hoes, RN, BSN, CCM. Case Management [place] [phone] No discharge needs present of time of review.

## 2014-12-29 NOTE — Progress Notes (Signed)
ANTIBIOTIC CONSULT NOTE - Follow up  Pharmacy Consult for Vancomycin and Zosyn Indication: Sepsis  No Known Allergies  Patient Measurements: Height: 5\' 5"  (165.1 cm) Weight: 124 lb 9 oz (56.5 kg) IBW/kg (Calculated) : 61.5  Vital Signs: Temp: 98.8 F (37.1 C) (03/03 0759) Temp Source: Oral (03/03 0759) BP: 102/79 mmHg (03/03 1430) Pulse Rate: 103 (03/03 1430) Intake/Output from previous day: 03/02 0701 - 03/03 0700 In: 2250 [I.V.:2000; IV Piggyback:250] Out: 300 [Urine:300] Intake/Output from this shift: Total I/O In: 50 [IV Piggyback:50] Out: -   Labs:  Recent Labs  12/28/14 1105 12/28/14 1106 12/29/14 0530  WBC 3.5*  --  3.0*  HGB 13.4  --  10.8*  PLT 233  --  162  CREATININE  --  1.6* 0.94   Estimated Creatinine Clearance: 61.8 mL/min (by C-G formula based on Cr of 0.94). No results for input(s): VANCOTROUGH, VANCOPEAK, VANCORANDOM, GENTTROUGH, GENTPEAK, GENTRANDOM, TOBRATROUGH, TOBRAPEAK, TOBRARND, AMIKACINPEAK, AMIKACINTROU, AMIKACIN in the last 72 hours.    Assessment: 69 yo male with metastatic colon cancer presents to [facility] 3/2 with fatigue and rash presumably from panitumumab. Labs reveal ARF, pt is hypotensive and tachycardic. Pharmacy is consulted to dose vancomycin and zosyn for sepsis.   3/2 >> Vanc >> 3/2 >> Zosyn >>  Today, 12/29/2014:  Tmax: elevated, 101.5  WBC: 3  Renal: SCr improved to 0.94, CrCl 62 ml/min  Lactic acid: 2.2 > 1  Blood and urine cultures pending   Goal of Therapy:  Vancomycin trough level 15-20 mcg/ml  Zosyn dose appropriate for renal function  Plan:   Increase to Vancomycin 750mg  IV q12h  Check trough at steady state  Zosyn 3.375gm IV q8h (4hr extended infusions)  Follow up renal function & cultures, clinical course   Gretta Arab PharmD, BCPS Pager [phone] 12/29/2014 2:49 PM

## 2014-12-29 NOTE — Progress Notes (Addendum)
Inpatient Diabetes Program Recommendations  AACE/ADA: New Consensus Statement on Inpatient Glycemic Control (2013)  Target Ranges:  Prepandial:   less than 140 mg/dL      Peak postprandial:   less than 180 mg/dL (1-2 hours)      Critically ill patients:  140 - 180 mg/dL     Results for FRIEND, DORFMAN (MRN [id number]) as of 12/29/2014 08:39  Ref. Range 12/28/2014 17:43  Hemoglobin A1C Latest Range: 4.8-5.6 % 8.2 (H)    Results for ENOCH, MOFFA (MRN [id number]) as of 12/29/2014 08:39  Ref. Range 12/28/2014 11:06 12/29/2014 05:30  Glucose Latest Range: 70-99 mg/dL 233 (H) 123 (H)     Chief Complaint: Weakness/Fatigue  History: HTN, Cardiomyopathy, Hepatitis B, Colon Cancer with Mets to Liver getting Chemotherapy (No History of DM mentioned in H&P)  Current Insulin Orders: Novolog Sensitive SSI   MD- Note patient had an elevated glucose level on admission and a Hemoglobin A1c was checked as a result.  Note A1c above normal.    Is this a new diagnosis of DM?  Has patient been receiving steroids prior to chemotherapy treatments?  Note lab glucose was down to 123 mg/dl this AM.    Note patient has many pressing issues at this time.    Will follow Wyn Quaker RN, MSN, CDE Diabetes Coordinator Inpatient Diabetes Program Team Pager: [phone] (8a-10p)

## 2014-12-29 NOTE — Progress Notes (Signed)
CRITICAL VALUE ALERT  Critical value received: Lactic Acid   Date of notification:  12/28/14   Time of notification:  0032  Critical value read back yes   Nurse who received alert:  Pamala Hurry    MD notified (1st page):  Tylene Fantasia NP   Time of first page:  0040   MD notified   Time of second page:  Responding MD:  Vevelyn Royals NP   Time MD responded:

## 2014-12-29 NOTE — Progress Notes (Signed)
Echocardiogram 2D Echocardiogram has been performed.  Jason Reilly 12/29/2014, 11:03 AM

## 2014-12-29 NOTE — Progress Notes (Signed)
Paged Baltazar Najjar NP regarding hypotensive  trends, new orders noted and received.

## 2014-12-29 NOTE — Progress Notes (Signed)
Patient ID: Jason Reilly, male   DOB: [birth year], 69 y.o.   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 Please note that history is limited due to difficulty with [place], interpreter not available at the time of the admission. Wife who was at bedside does not speak [place]. Pt has seen his oncologist [name] earlier in the day and she has asked [facility] to have pt admitted as his blood tests were notable for acute renal failure Cr 1.6, BP in low 90's/60's with HR in 110's. Pt did report he has broken out in rash several days ago   Pt was scheduled for chemo today but his son called the office saying pt feels very tired, has not been eating or drinking much over the past weeks. Son reported that pt has los over 20 lbs in the past 3 months. CT ABD 07/22/2014 revealed multiple large partially calcified hepatic masses consistent with metastatic disease, retroperitoneal adenopathy with the largest node measuring 1.6 cm, nonspecific 1.4 cm left adrenal nodule. His tumor marker showed CEA > 10,000, CA 19-9 12,929, AFP 3.2 (normal).   Assessment/Plan:    Principal Problem:  Weakness - secondary to metastatic colon cancer, chemotherapy, failure to thrive, dehydration, Sepsis  - Patient reports feeling better this morning and looks clinically stable - Blood pressure stable, continue to monitor in step down unit - Will need physical therapy evaluation  Active Problems:  Sepsis - criteria for sepsis met on admission, source still  unclear  - BP 73/31, RR 22, HR 113, T 99.39F in immunocompromised pt on chemotx, WBC 3.5, elevated lactic acid  - possibly UTI based on urinalysis with trace leukocytes, final urine culture report pending - Chest x-ray with no signs of pneumonia, blood cultures pending - patient looks clinically stable and reports feeling better this morning, Tmax 101.59F over the past 24 hours - continue Vanc and Zosyn day #2 - We will hold IV fluids as weight up by 4 pounds since admission  Leukopenia - from chemo - WBC trending since admission: 3.5 --> 3.0 - Repeat CBC in the morning   Anemia of chronic disease, malignancy  - Hemoglobin on admission 13, likely from hemoconcentration in the setting of dehydration - IV fluids provided and hemoglobin is at baseline this morning - Review of records indicated hemoglobin at baseline is 10 - 11  Dilated cardiomyopathy, chronic systolic CHF - with apparent EF 25 % in 2014 but no 2 D ECHO on file  - 2 D ECHO pending - weight trend since admission: 120 lbs --> 124 lbs - no ACEI/ARB due to renal failure and hypotension   Metastatic colon cancer to liver - per oncology team  - Oncologist notified of patient's admission  Acute renal failure - secondary to pre renal etiology from poor oral intake - IV fluids provided and creatinine is now within normal limits - continue to hold  Lisinopril - HCTZ that pt takes at home   Hyponatremia - 126 this morning, continue to  monitor  Hyperglycemia secondary to DM type II  - pt has not been eating but CBG is 200's - A1c 8.2, patient with new diagnosis of diabetes - We'll keep on sliding scale insulin for now, asked diabetic educator to see patient - To keep regimen simple, patient would likely be all right to take oral medications upon discharge  Rash - possibly from chemo side effect - pt was advised to avoid sun exposure but apparently has not followed recommendations - supportive care as needed   Severe  PCM - in the setting of acute on chronic illness - advance diet as pt able to tolerate   DVT prophylaxis - Lovenox SQ  Code Status: Full.  Family Communication:  plan of care discussed with the patient, son at bedside help with interpreting  Disposition Plan: Home when stable.   IV access:  Peripheral IV  Procedures and diagnostic studies:    Dg Chest Port 1 View  12/28/2014  No acute abnormalities.     Medical Consultants:  Oncology  Other Consultants:  PT/OT Diabetic educator   IAnti-Infectives:   Vancomycin 3/2 --> Zosyn 3//2 -->  Faye Ramsay, MD  [facility] Pager [phone]  If 7PM-7AM, please contact night-coverage [email or web address] Password [facility] 12/29/2014, 9:43 AM   LOS: 1 day   HPI/Subjective: No events overnight.   Objective: Filed Vitals:   12/29/14 0630 12/29/14 0700 12/29/14 0759 12/29/14 0900  BP: 127/63 124/69  102/58  Pulse:  80  79  Temp:   98.8 F (37.1 C)   TempSrc:   Oral   Resp: _0 Height:      Weight:      SpO2: 100% 100%  100%    Intake/Output Summary (Last 24 hours) at 12/29/14 0943 Last data filed at 12/29/14 0519  Gross per 24 hour  Intake   2200 ml  Output    300 ml  Net   1900 ml    Exam:   General:  Pt is alert, follows commands appropriately, not in acute distress, facial macular rash better   Cardiovascular: Regular rate and rhythm, S1/S2, no murmurs, no rubs, no gallops  Respiratory: Clear to auscultation bilaterally, no wheezing, no crackles, no rhonchi  Abdomen: Soft, non tender, non distended, bowel sounds present, no guarding  Extremities: No edema, pulses DP and PT palpable bilaterally  Neuro: Grossly nonfocal  Data Reviewed: Basic Metabolic Panel:  Recent Labs Lab 12/28/14 1106 12/29/14 0530  NA 127* 126*  K 4.3 3.5  CL  --  96  CO2 26 26  GLUCOSE 233* 123*  BUN 29.3* 20  CREATININE 1.6* 0.94  CALCIUM 9.1 7.6*   Liver Function Tests:  Recent Labs Lab 12/28/14 1106  AST 26  ALT 14   ALKPHOS 213*  BILITOT 1.29*  PROT 7.1  ALBUMIN 2.6*   CBC:  Recent Labs Lab 12/28/14 1105 12/29/14 0530  WBC 3.5* 3.0*  NEUTROABS 0.4*  --   HGB 13.4 10.8*  HCT 38.4 32.2*  MCV 84.6 84.3  PLT 233 162   CBG:  Recent Labs Lab 12/29/14 0035 12/29/14 0329  GLUCAP 109* 169*   Recent Results (from the past 240 hour(s))  MRSA PCR Screening     Status: None   Collection Time: 12/28/14  2:25 PM  Result Value Ref Range Status   MRSA by PCR NEGATIVE NEGATIVE Final     Scheduled Meds: . enoxaparin (LOVENOX) injection  40 mg Subcutaneous Q24H  . insulin aspart  0-9 Units  Subcutaneous TID WC  . piperacillin-tazobactam (ZOSYN)  IV  3.375 g Intravenous Q8H  . vancomycin  750 mg Intravenous Q24H   Continuous Infusions: . sodium chloride 100 mL/hr at 12/29/14 0500

## 2014-12-29 NOTE — Progress Notes (Signed)
Jason Reilly   DOB:September 10, 1946   YH#:[id number]   TDV#:[id number]  Subjective: Patient was directly admitted from my office to [facility]. He spiked fever last night, on broad antibiotics. Hypotension resolved. He still feels quite weak.   Objective:  Filed Vitals:   12/29/14 2040  BP: 131/70  Pulse: 97  Temp: 98.4 F (36.9 C)  Resp: 20    Body mass index is 20.73 kg/(m^2).  Intake/Output Summary (Last 24 hours) at 12/29/14 2328 Last data filed at 12/29/14 1600  Gross per 24 hour  Intake   2460 ml  Output    300 ml  Net   2160 ml     Sclerae unicteric  Oropharynx clear  No peripheral adenopathy  Lungs clear -- no rales or rhonchi  Heart regular rate and rhythm  Abdomen benign  MSK no focal spinal tenderness, no peripheral edema  Neuro nonfocal  Skin: Diffuse rash on the face and trunk.   CBG (last 3)   Recent Labs  12/29/14 0329 12/29/14 1216 12/29/14 1605  GLUCAP 169* 107* 151*     Labs:  Lab Results  Component Value Date   WBC 3.0* 12/29/2014   HGB 10.8* 12/29/2014   HCT 32.2* 12/29/2014   MCV 84.3 12/29/2014   PLT 162 12/29/2014   NEUTROABS 0.4* 12/28/2014    '@LASTCHEMISTRY' @  Urine Studies No results for input(s): UHGB, CRYS in the last 72 hours.  Invalid input(s): UACOL, UAPR, USPG, UPH, UTP, UGL, UKET, UBIL, UNIT, UROB, ULEU, UEPI, UWBC, URBC, UBAC, CAST, UCOM, BILUA  Basic Metabolic Panel:  Recent Labs Lab 12/28/14 1106 12/29/14 0530  NA 127* 126*  K 4.3 3.5  CL  --  96  CO2 26 26  GLUCOSE 233* 123*  BUN 29.3* 20  CREATININE 1.6* 0.94  CALCIUM 9.1 7.6*   GFR Estimated Creatinine Clearance: 61.8 mL/min (by C-G formula based on Cr of 0.94). Liver Function Tests:  Recent Labs Lab 12/28/14 1106  AST 26  ALT 14  ALKPHOS 213*  BILITOT 1.29*  PROT 7.1  ALBUMIN 2.6*   No results for input(s): LIPASE, AMYLASE in the last 168 hours. No results for input(s): AMMONIA in the last 168 hours. Coagulation profile  Recent  Labs Lab 12/28/14 2035  INR 1.28    CBC:  Recent Labs Lab 12/28/14 1105 12/29/14 0530  WBC 3.5* 3.0*  NEUTROABS 0.4*  --   HGB 13.4 10.8*  HCT 38.4 32.2*  MCV 84.6 84.3  PLT 233 162   Cardiac Enzymes: No results for input(s): CKTOTAL, CKMB, CKMBINDEX, TROPONINI in the last 168 hours. BNP: Invalid input(s): POCBNP CBG:  Recent Labs Lab 12/29/14 0035 12/29/14 0329 12/29/14 1216 12/29/14 1605  GLUCAP 109* 169* 107* 151*   D-Dimer No results for input(s): DDIMER in the last 72 hours. Hgb A1c  Recent Labs  12/28/14 1743  HGBA1C 8.2*   Lipid Profile No results for input(s): CHOL, HDL, LDLCALC, TRIG, CHOLHDL, LDLDIRECT in the last 72 hours. Thyroid function studies  Recent Labs  12/28/14 1743  TSH 0.355   Anemia work up No results for input(s): VITAMINB12, FOLATE, FERRITIN, TIBC, IRON, RETICCTPCT in the last 72 hours. Microbiology Recent Results (from the past 240 hour(s))  MRSA PCR Screening     Status: None   Collection Time: 12/28/14  2:25 PM  Result Value Ref Range Status   MRSA by PCR NEGATIVE NEGATIVE Final    Comment:        The GeneXpert MRSA Assay (FDA approved for  NASAL specimens only), is one component of a comprehensive MRSA colonization surveillance program. It is not intended to diagnose MRSA infection nor to guide or monitor treatment for MRSA infections.       Studies:  Dg Chest Port 1 View  12/28/2014   CLINICAL DATA:  Sudden onset dyspnea, metastatic colon cancer, hypertension, hepatitis-B  EXAM: PORTABLE CHEST - 1 VIEW  COMPARISON:  Portable exam 1953 hours compared to 03/23/2013  FINDINGS: RIGHT jugular Port-A-Cath with tip projecting over SVC.  Normal heart size, mediastinal contours, and pulmonary vascularity.  Lungs clear.  No pleural effusion or pneumothorax.  Bones unremarkable.  IMPRESSION: No acute abnormalities.   Electronically Signed   By: Lavonia [name] M.D.   On: 12/28/2014 20:12    Assessment: 69 y.o. 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  1. Metastatic sigmoid colon cancer, with diffuse liver, lungs, node and left adrenal gland metastases. KRAS/NRAS wild type  2. Neutropenic fever and sepsis 3. Hypotension secondary to sepsis and dehydration 4. Dehydration 5. Acute renal failure 6. CHF 7. Diffuse skin rash secondary to panitumumab   Recommendations: -Appreciate hospitalist team here -Agree with broad antibiotics -ID workup is still pending -Recommend Neupogen 300 g subcutaneous daily until [place] above 1.5 -Cautious about his fluids due to his history of CHF -Nutrition support and dietitian consult -I'll follow-up closely after his hospital discharge.   Truitt Merle, MD 12/29/2014  11:28 PM

## 2014-12-30 LAB — COMPREHENSIVE METABOLIC PANEL
ALK PHOS: 175 U/L — AB (ref 39–117)
ALT: 16 U/L (ref 0–53)
AST: 27 U/L (ref 0–37)
Albumin: 2.4 g/dL — ABNORMAL LOW (ref 3.5–5.2)
Anion gap: 5 (ref 5–15)
BUN: 10 mg/dL (ref 6–23)
CO2: 28 mmol/L (ref 19–32)
Calcium: 8.1 mg/dL — ABNORMAL LOW (ref 8.4–10.5)
Chloride: 96 mmol/L (ref 96–112)
Creatinine, Ser: 0.96 mg/dL (ref 0.50–1.35)
GFR calc Af Amer: >90 mL/min (ref >90)
GFR calc non Af Amer: 84 mL/min — ABNORMAL LOW (ref >90)
Glucose, Bld: 97 mg/dL (ref 70–99)
POTASSIUM: 3.5 mmol/L (ref 3.5–5.1)
SODIUM: 129 mmol/L — AB (ref 135–145)
Total Bilirubin: 1.1 mg/dL (ref 0.3–1.2)
Total Protein: 6.5 g/dL (ref 6.0–8.3)

## 2014-12-30 LAB — CBC WITH DIFFERENTIAL/PLATELET
Basophils Absolute: 0 10*3/uL (ref 0.0–0.1)
Basophils Relative: 0 % (ref 0–1)
EOS PCT: 0 % (ref 0–5)
Eosinophils Absolute: 0 10*3/uL (ref 0.0–0.7)
HCT: 32.6 % — ABNORMAL LOW (ref 39.0–52.0)
Hemoglobin: 11.3 g/dL — ABNORMAL LOW (ref 13.0–17.0)
LYMPHS PCT: 15 % (ref 12–46)
Lymphs Abs: 2 10*3/uL (ref 0.7–4.0)
MCH: 29.2 pg (ref 26.0–34.0)
MCHC: 34.7 g/dL (ref 30.0–36.0)
MCV: 84.2 fL (ref 78.0–100.0)
Monocytes Absolute: 2.2 10*3/uL — ABNORMAL HIGH (ref 0.1–1.0)
Monocytes Relative: 17 % — ABNORMAL HIGH (ref 3–12)
NEUTROS PCT: 68 % (ref 43–77)
Neutro Abs: 8.8 10*3/uL — ABNORMAL HIGH (ref 1.7–7.7)
PLATELETS: 208 10*3/uL (ref 150–400)
RBC: 3.87 MIL/uL — AB (ref 4.22–5.81)
RDW: 14.4 % (ref 11.5–15.5)
WBC: 13 10*3/uL — ABNORMAL HIGH (ref 4.0–10.5)

## 2014-12-30 LAB — GLUCOSE, CAPILLARY
Glucose-Capillary: 108 mg/dL — ABNORMAL HIGH (ref 70–99)
Glucose-Capillary: 117 mg/dL — ABNORMAL HIGH (ref 70–99)
Glucose-Capillary: 111 mg/dL — ABNORMAL HIGH (ref 70–99)
Glucose-Capillary: 190 mg/dL — ABNORMAL HIGH (ref 70–99)

## 2014-12-30 MED ORDER — GLUCERNA SHAKE PO LIQD
237.0000 mL | Freq: Three times a day (TID) | ORAL | Status: DC
  Administered 2014-12-30 (×2): 237 mL via ORAL
  Filled 2014-12-30 (×4): qty 237

## 2014-12-30 NOTE — Evaluation (Addendum)
Physical Therapy Evaluation Patient Details Name: Jason Reilly MRN: [id number] DOB: December 24, 1945 Today's Date: 12/30/2014   History of Present Illness  69 yo male admitted with weakness. Hx of met colon cancer, CHF, hypotension  Clinical Impression  Attempted to use interpreter phone but pt refused it. On eval, pt required Min guard assist for mobility-able to ambulate ~125 feet without assistive device. Pt tolerated activity well. Pt denied dizziness/lightheadedness. BP 108/64 sitting EOB. Pt easily agitated during session-limited participation and angry at times.     Follow Up Recommendations No PT follow up;Supervision/Assistance - 24 hour    Equipment Recommendations  None recommended by PT    Recommendations for Other Services OT consult     Precautions / Restrictions Precautions Precautions: Fall Restrictions Weight Bearing Restrictions: No      Mobility  Bed Mobility Overal bed mobility: Needs Assistance Bed Mobility: Supine to Sit;Sit to Supine     Supine to sit: Min assist Sit to supine: Supervision   General bed mobility comments: Min assist to encourage participation  Transfers Overall transfer level: Needs assistance   Transfers: Sit to/from Stand Sit to Stand: Min guard         General transfer comment: close guard for safety  Ambulation/Gait Ambulation/Gait assistance: Min guard Ambulation Distance (Feet): 125 Feet Assistive device: None Gait Pattern/deviations: Wide base of support;Step-through pattern     General Gait Details: close guard for safety.   Stairs            Wheelchair Mobility    Modified Rankin (Stroke Patients Only)       Balance                                             Pertinent Vitals/Pain Pain Assessment: No/denies pain    Home Living Family/patient expects to be discharged to:: Private residence Living Arrangements: Spouse/significant other;Children             Home Equipment:  None      Prior Function Level of Independence: Independent               Hand Dominance        Extremity/Trunk Assessment   Upper Extremity Assessment: Defer to OT evaluation           Lower Extremity Assessment: Generalized weakness      Cervical / Trunk Assessment: Kyphotic  Communication   Communication: Prefers language other than English (pt refused use of interpreter phone)  Cognition Arousal/Alertness: Awake/alert Behavior During Therapy: WFL for tasks assessed/performed Overall Cognitive Status: Difficult to assess                      General Comments      Exercises        Assessment/Plan    PT Assessment Patient needs continued PT services  PT Diagnosis Difficulty walking;Generalized weakness   PT Problem List Decreased strength;Decreased activity tolerance;Decreased balance;Decreased mobility  PT Treatment Interventions Gait training;Functional mobility training;Therapeutic activities;Patient/family education;Balance training   PT Goals (Current goals can be found in the Care Plan section) Acute Rehab PT Goals Patient Stated Goal: none stated PT Goal Formulation: Patient unable to participate in goal setting Time For Goal Achievement: 01/13/15 Potential to Achieve Goals: Good    Frequency Min 3X/week   Barriers to discharge        Co-evaluation  End of Session Equipment Utilized During Treatment: Gait belt Activity Tolerance: Patient tolerated treatment well Patient left: in bed;with call bell/phone within reach;with family/visitor present           Time: 1355-1405 PT Time Calculation (min) (ACUTE ONLY): 10 min   Charges:   PT Evaluation $Initial PT Evaluation Tier I: 1 Procedure     PT G Codes:        Weston Anna, MPT Pager: [phone]

## 2014-12-30 NOTE — Evaluation (Signed)
Occupational Therapy Evaluation Patient Details Name: Jason Reilly MRN: [id number] DOB: 1945/12/28 Today's Date: 12/30/2014    History of Present Illness 69 yo male admitted with weakness. Hx of met colon cancer, CHF, hypotension   Clinical Impression   Pt was admitted for the above.  Pt was independent with adls prior to admission.  Pt speaks [name] and stated that he did not need interpreter phone that he understood English very well.  He was difficult to understand at times.  May do better with a live interpreter to reiterate purpose.  Pt was angry at times.  He was independent with adls prior to admission.  Will likely have assistance for ADLs.  Will follow for strengthening, safety with transfers and DME recommendations as well as energy conservation    Follow Up Recommendations  Supervision/Assistance - 24 hour    Equipment Recommendations   (to be further assessed, ? shower seat)    Recommendations for Other Services       Precautions / Restrictions Precautions Precautions: Fall Restrictions Weight Bearing Restrictions: No      Mobility Bed Mobility Overal bed mobility: Needs Assistance Bed Mobility: Supine to Sit;Sit to Supine     Supine to sit: Min assist Sit to supine: Supervision   General bed mobility comments: Min A to initiate, ? if pt understood what we were asking  Transfers Overall transfer level: Needs assistance   Transfers: Sit to/from Stand Sit to Stand: Min guard         General transfer comment: close guard for safety    Balance                                            ADL Overall ADL's : Needs assistance/impaired                         Toilet Transfer: Min guard;Comfort height toilet;Ambulation             General ADL Comments: did not fully assess ADLs--ambulated to bathroom and practiced toilet transfer.  Pt stated that he understood English but did not follow all commands.  Assisted to sit EOB  as pt did not initiate.  Also did not initiate putting arms through gown to make a robe--may not have understood.  Pt stated he was fatiqued when he got back to bed.  Will likely have ADL assist at home if needed, but will follow for activity tolerance and strengthening     Vision     Perception     Praxis      Pertinent Vitals/Pain Pain Assessment: No/denies pain     Hand Dominance     Extremity/Trunk Assessment Upper Extremity Assessment Upper Extremity Assessment:  (not tested:  pt fatiqued when he got back to bed; used arms )      Cervical / Trunk Assessment Cervical / Trunk Assessment: Kyphotic   Communication Communication Communication: Prefers language other than English (did not want to use interpreter phone "I don't need it").  Communication was not ideal during session.  See ADL notes.   Cognition Arousal/Alertness: Awake/alert Behavior During Therapy: WFL for tasks assessed/performed Overall Cognitive Status: Difficult to assess, ?wife in room, used finger to make circle around her head when pt was talking.  Pt got up and walked over to closet, which was locked.  I was unable to determine  what he wanted.                     General Comments       Exercises       Shoulder Instructions      Home Living Family/patient expects to be discharged to:: Private residence Living Arrangements: Spouse/significant other;Children                           Home Equipment: None   Additional Comments: Pt angry when asking PLOF questions.  I think he has a normal toilet and shower stall.  Did not use AE      Prior Functioning/Environment Level of Independence: Independent             OT Diagnosis: Generalized weakness   OT Problem List: Decreased strength;Decreased activity tolerance;Decreased knowledge of use of DME or AE   OT Treatment/Interventions: Self-care/ADL training;DME and/or AE instruction;Patient/family education;Therapeutic  activities;Energy conservation    OT Goals(Current goals can be found in the care plan section) Acute Rehab OT Goals Patient Stated Goal: none stated OT Goal Formulation:  (educated we would see him for strenghening) Time For Goal Achievement: 01/06/15 Potential to Achieve Goals: Good ADL Goals Pt Will Perform Grooming: with supervision;standing Pt Will Transfer to Toilet: with supervision;regular height toilet;ambulating Pt Will Perform Tub/Shower Transfer: with supervision;Shower transfer;shower seat;ambulating Additional ADL Goal #1: pt will verbalize understanding of energy conservation strategies (via interpreter)  OT Frequency: Min 2X/week   Barriers to D/C:            Co-evaluation PT/OT/SLP Co-Evaluation/Treatment: Yes Reason for Co-Treatment: For patient/therapist safety (also pt weakness/decreased endurance) PT goals addressed during session: Mobility/safety with mobility OT goals addressed during session: ADL's and self-care      End of Session    Activity Tolerance: Patient limited by fatigue Patient left: in bed;with call bell/phone within reach;with bed alarm set;with family/visitor present   Time: 1355-1405 OT Time Calculation (min): 10 min Charges:  OT General Charges $OT Visit: 1 Procedure OT Evaluation $Initial OT Evaluation Tier I: 1 Procedure G-Codes:    Jace Dowe 15-Jan-2015, 2:59 PM   Lesle Chris, OTR/L [phone] 01-15-2015

## 2014-12-30 NOTE — Progress Notes (Signed)
INITIAL NUTRITION ASSESSMENT  DOCUMENTATION CODES Per approved criteria  -Severe malnutrition in the context of chronic illness  Pt meets criteria for severe MALNUTRITION in the context of chronic illness as evidenced by 10% wt loss in <3 months and po intake <75% of estimated needs for >1 month.  INTERVENTION: - Glucerna Shake po TID, each supplement provides 220 kcal and 10 grams of protein - Followed-up with education from Diabetes Coordinator. Questions answered. Focus is to increase po intake for weight maintenance and to decrease intake of sugary beverages. Encouraged pt to continue home nutritional supplements.  - RD will continue to monitor  NUTRITION DIAGNOSIS: Inadequate oral intake related to colon cancer with liver mets as evidenced by wt loss.   Goal: Pt to meet >/= 90% of their estimated nutrition needs   Monitor:  Weight trend, po intake, acceptance of supplements  Reason for Assessment: Consult for new Diabetes diagnosis Education, Malnutrition Screening Tool  69 y.o. 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   Interpreter used for RD visit.  - Followed-up after pt meeting with Diabetes Coordinator who provided educational materials for new Diabetes diagnosis. Goals are to increase po intake and decrease intake of sweetened beverages and concentrated sweets.  - Questions answered regarding Diabetes and blood glucose.  - Encouraged pt to continue supplements at home. Will send Glucerna Shakes while in the hospital. Pt drinks Boost High Protein supplements once daily at home.  - Pt reports a 15 lb wt  loss in just over a month. His po intake is better at home than in the hospital because he "has a hard time with American foods." Wife brought pt snacks to the hospital. He prefers her to cook for him.  - Labs reviewed  Nutrition Focused Physical Exam:  Subcutaneous Fat:  Orbital Region: moderate depletion Upper Arm Region: moderate depletion Thoracic and Lumbar Region: n/a  Muscle:  Temple Region: moderate depletion Clavicle Bone Region: moderate depletion Clavicle and Acromion Bone Region: moderate depletion Scapular Bone Region: n/a Dorsal Hand: moderate depletion Patellar Region: moderate to severe depletion Anterior Thigh Region: moderate to severe depletion Posterior Calf Region: moderate depletion  Edema: none  Height: Ht Readings from Last 1 Encounters:  12/28/14 5\' 5"  (1.651 m)    Weight: Wt Readings from Last 1 Encounters:  12/30/14 125 lb (56.7 kg)    Ideal Body Weight: 61.5 kg  % Ideal Body Weight: 92%  Wt Readings from Last 10 Encounters:  12/30/14 125 lb (56.7 kg)  12/28/14 122 lb 3.2 oz (55.43 kg)  12/14/14 131 lb 12 oz (59.761 kg)  12/02/14 136 lb 6.4 oz (61.871 kg)  11/22/14 140 lb 8 oz (63.73 kg)  11/02/14 138 lb 6.4 oz (62.778 kg)  04/13/13 161 lb 14.4 oz (73.437 kg)  03/16/13 156 lb 4.8 oz (70.897 kg)  03/09/13 154 lb 4.8 oz (69.99 kg)  03/03/13 161 lb (73.029 kg)    Usual Body Weight: 140 lbs  % Usual Body Weight: 89%  BMI:  Body mass index is 20.8 kg/(m^2).  Estimated Nutritional Needs: Kcal: 1700-1900 Protein: 85-100 g Fluid: 1.9 L/day  Skin:  intact  Diet Order: Diet regular  EDUCATION NEEDS: -Education needs addressed   Intake/Output Summary (Last 24 hours) at 12/30/14 1209 Last data filed at 12/30/14 0733  Gross per 24 hour  Intake    530 ml  Output    150 ml  Net    380 ml    Last BM: prior to admission   Labs:   Recent Labs Lab 12/28/14 1106 12/29/14 0530 12/30/14 0600  NA 127* 126* 129*  K 4.3 3.5 3.5   CL  --  96 96  CO2 26 26 28   BUN 29.3* 20 10  CREATININE 1.6* 0.94 0.96  CALCIUM 9.1 7.6* 8.1*  GLUCOSE 233* 123* 97    CBG (last 3)   Recent Labs  12/29/14 2358 12/30/14 0737 12/30/14 1202  GLUCAP 124* 108* 117*    Scheduled Meds: . enoxaparin (LOVENOX) injection  40 mg Subcutaneous Q24H  . insulin aspart  0-9 Units Subcutaneous TID WC  . piperacillin-tazobactam (ZOSYN)  IV  3.375 g Intravenous Q8H  . vancomycin  750 mg Intravenous Q12H    Continuous Infusions:   Past Medical History  Diagnosis Date  . Tachycardia   . Abnormal EKG   . Hypertension   . Non-ischemic cardiomyopathy   . Hepatitis B     Past Surgical History  Procedure Laterality Date  . Nuclear stress test  03/03/2013    High risk - consistent with nonischemic cardiomyopathy  . Left and right heart catheterization with coronary angiogram N/A 03/29/2013    Procedure: LEFT AND RIGHT HEART CATHETERIZATION WITH CORONARY ANGIOGRAM;  Surgeon: Pixie Casino, MD;  Location: [facility] CATH LAB;  Service: Cardiovascular;  Laterality: N/A;    Laurette Schimke [place], [place], [place]

## 2014-12-30 NOTE — Progress Notes (Signed)
Patient ID: Jason Reilly, male   DOB: 1946-01-06, 69 y.o.   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 Please note that history is limited due to difficulty with [place], interpreter not available at the time of the admission. Wife who was at bedside does not speak [place]. Pt has seen his oncologist [name] earlier in the day and she has asked [facility] to have pt admitted as his blood tests were notable for acute renal failure Cr 1.6, BP in low 90's/60's with HR in 110's. Pt did report he has broken out in rash several days ago   Pt was scheduled for chemo today but his son called the office saying pt feels very tired, has not been eating or drinking much over the past weeks. Son reported that pt has los over 20 lbs in the past 3 months. CT ABD 07/22/2014 revealed multiple large partially calcified hepatic masses consistent with metastatic disease, retroperitoneal adenopathy with the largest node measuring 1.6 cm, nonspecific 1.4 cm left adrenal nodule. His tumor marker showed CEA > 10,000, CA 19-9 12,929, AFP 3.2 (normal).   Major events: 3/3 - transfer from SDU to telemetry bed, started Neupogen  3/4 - interpreter used   Assessment/Plan:    Principal Problem:  Weakness - secondary to metastatic colon cancer, chemotherapy, failure to thrive, dehydration, Sepsis  - Patient reports feeling better this morning and looks clinically stable - Blood pressure stable, continue to monitor on telemetry unit  - PT evaluation  requested  Active Problems:  Sepsis - criteria for sepsis met on admission, source still unclear  - BP 73/31, RR 22, HR 113, T 99.17F in immunocompromised pt on chemotx, WBC 3.5, elevated lactic acid  - possibly UTI based on urinalysis with trace leukocytes, final urine culture report pending - Chest x-ray with no signs of pneumonia, blood cultures pending - patient looks clinically stable and reports feeling better this morning, afebrile over the past 24 hours  - continue Vanc and Zosyn day #3  Leukopenia - Neupogen started 3/3, WBC up now  - WBC trending since admission: 3.5 --> 3.0 --> 13 - Repeat CBC in the morning  Anemia of chronic disease, malignancy  - Hemoglobin on admission 13, likely from hemoconcentration in the setting of dehydration - hemoglobin is at baseline this morning - Review of records indicated hemoglobin at baseline is 10 - 11  Dilated cardiomyopathy, chronic systolic CHF - with apparent EF 25 % in 2014 but no 2 D ECHO on file  - 2 D ECHO pending - weight trend since admission: 120 lbs --> 124 lbs --> 125 lbs - no ACEI/ARB due to renal failure and hypotension   Metastatic colon cancer to liver - per oncology team  - Oncologist notified of patient's admission  Acute renal failure - secondary to pre renal etiology from poor oral intake - IV fluids provided and creatinine is now within normal limits - continue to hold Lisinopril - HCTZ that pt takes at home  Hyponatremia - 129 this morning, continue to monitor - BMP in AM  DM type II  - A1c 8.2, patient with new diagnosis of diabetes - We'll keep on sliding scale insulin for now - agree with Tradjenta on discharge   Rash - chemo side effect - pt was advised to avoid sun exposure but apparently has not followed recommendations - supportive care as needed   Severe PCM - in the setting of acute on chronic illness - advanced diet, pt able to tolerate   DVT prophylaxis - Lovenox SQ  Code  Status: Full.  Family Communication: plan of care discussed with the patient, interpreter at bedside  Disposition Plan: Home when stable.   IV access:  Peripheral IV  Procedures and diagnostic studies:    Dg Chest Port 1 View  12/28/2014   CLINICAL DATA:  Sudden onset dyspnea, metastatic colon cancer, hypertension, hepatitis-B  EXAM: PORTABLE CHEST - 1 VIEW  COMPARISON:  Portable exam 1953 hours compared to 03/23/2013  FINDINGS: RIGHT jugular Port-A-Cath with tip projecting over SVC.  Normal heart size, mediastinal contours, and pulmonary vascularity.  Lungs clear.  No pleural effusion or pneumothorax.  Bones unremarkable.  IMPRESSION: No acute abnormalities.   Electronically Signed   By: Lavonia [name] M.D.   On: 12/28/2014 20:12    Medical Consultants:  Oncology  Other Consultants:  None  IAnti-Infectives:   Vanc 3/2 --> Zosyn 3/2 -->  Faye Ramsay, MD  [facility] Pager [phone]  If 7PM-7AM, please contact night-coverage [email or web address] Password [email or web address] 12/30/2014, 11:19 AM   LOS: 2 days   HPI/Subjective: No events overnight.   Objective: Filed Vitals:   12/29/14 2000 12/29/14 2040 12/30/14 0500 12/30/14 0656  BP:  131/70  98/52  Pulse:  97  102  Temp: 99.9 F (37.7 C) 98.4 F (36.9 C)  98.1 F (36.7 C)  TempSrc: Oral Oral  Oral  Resp:  20  18  Height:      Weight:   56.7 kg (125 lb)   SpO2:  100%  96%    Intake/Output Summary (Last 24 hours) at 12/30/14 1119 Last data filed at 12/30/14 0733  Gross per 24 hour  Intake    560 ml  Output    150 ml  Net    410 ml    Exam:   General:  Pt is alert, follows commands appropriately, not in acute distress  Cardiovascular: Regular rate and rhythm, S1/S2, no murmurs, no rubs, no gallops  Respiratory: Clear to auscultation bilaterally, no wheezing, no crackles, no rhonchi  Abdomen: Soft, non tender, non distended, bowel sounds present, no guarding  Extremities: pulses DP and PT palpable bilaterally  Neuro:  Grossly nonfocal  Data Reviewed: Basic Metabolic Panel:  Recent Labs Lab 12/28/14 1106 12/29/14 0530 12/30/14 0600  NA 127* 126* 129*  K 4.3 3.5 3.5  CL  --  96 96  CO2 '26 26 28  ' GLUCOSE 233* 123* 97  BUN 29.3* 20 10  CREATININE 1.6* 0.94 0.96  CALCIUM 9.1 7.6* 8.1*   Liver Function Tests:  Recent Labs Lab 12/28/14 1106 12/30/14 0600  AST 26 27  ALT 14 16  ALKPHOS 213* 175*  BILITOT 1.29* 1.1  PROT 7.1 6.5  ALBUMIN 2.6* 2.4*   CBC:  Recent Labs Lab 12/28/14 1105 12/29/14 0530 12/30/14 0600  WBC 3.5* 3.0* 13.0*  NEUTROABS 0.4*  --  8.8*  HGB 13.4 10.8* 11.3*  HCT 38.4 32.2* 32.6*  MCV 84.6 84.3 84.2  PLT 233 162  208   CBG:  Recent Labs Lab 12/29/14 0329 12/29/14 1216 12/29/14 1605 12/29/14 2358 12/30/14 0737  GLUCAP 169* 107* 151* 124* 108*    Recent Results (from the past 240 hour(s))  MRSA PCR Screening     Status: None   Collection Time: 12/28/14  2:25 PM  Result Value Ref Range Status   MRSA by PCR NEGATIVE NEGATIVE Final    Comment:        The GeneXpert MRSA Assay (FDA approved for NASAL specimens only), is one component of a comprehensive MRSA colonization surveillance program. It is not intended to diagnose MRSA infection nor to guide or monitor treatment for MRSA infections.   Culture, blood (x 2)     Status: None (Preliminary result)   Collection Time: 12/28/14  8:25 PM  Result Value Ref Range Status   Specimen Description BLOOD RIGHT HAND  Final   Special Requests BOTTLES DRAWN AEROBIC ONLY 10CC  Final   Culture   Final           BLOOD CULTURE RECEIVED NO GROWTH TO DATE CULTURE WILL BE HELD FOR 5 DAYS BEFORE ISSUING A FINAL NEGATIVE REPORT Performed at [facility]    Report Status PENDING  Incomplete  Culture, blood (x 2)     Status: None (Preliminary result)   Collection Time: 12/28/14  8:35 PM  Result Value Ref Range Status   Specimen Description BLOOD LEFT ARM  Final   Special Requests BOTTLES DRAWN AEROBIC  AND ANAEROBIC 10CC  Final   Culture   Final           BLOOD CULTURE RECEIVED NO GROWTH TO DATE CULTURE WILL BE HELD FOR 5 DAYS BEFORE ISSUING A FINAL NEGATIVE REPORT Performed at [facility]    Report Status PENDING  Incomplete     Scheduled Meds: . enoxaparin (LOVENOX) injection  40 mg Subcutaneous Q24H  . insulin aspart  0-9 Units Subcutaneous TID WC  . piperacillin-tazobactam (ZOSYN)  IV  3.375 g Intravenous Q8H  . vancomycin  750 mg Intravenous Q12H   Continuous Infusions:

## 2014-12-30 NOTE — Progress Notes (Signed)
Inpatient Diabetes Program Recommendations  AACE/ADA: New Consensus Statement on Inpatient Glycemic Control (2013)  Target Ranges:  Prepandial:   less than 140 mg/dL      Peak postprandial:   less than 180 mg/dL (1-2 hours)      Critically ill patients:  140 - 180 mg/dL    Results for CONSTANTIN, HILLERY (MRN [id number]) as of 12/30/2014 11:44  Ref. Range 12/29/2014 12:16 12/29/2014 16:05 12/29/2014 23:58  Glucose-Capillary Latest Range: 70-99 mg/dL 107 (H) 151 (H) 124 (H)    Results for KINGSLEY, FARACE (MRN [id number]) as of 12/30/2014 11:44  Ref. Range 12/28/2014 17:43  Hemoglobin A1C Latest Range: 4.8-5.6 % 8.2 (H)     Admitted with the following: 1. Metastatic sigmoid colon cancer, with diffuse liver, lungs, node and left adrenal gland metastases. KRAS/NRAS wild type  2. Neutropenic fever and sepsis 3. Hypotension secondary to sepsis and dehydration 4. Dehydration 5. Acute renal failure    **Received referral for this patient from Dr. Doyle Askew. New diagnosis of DM as evidenced by A1c of 8.2%.  **Note Dr. Doyle Askew would prefer to send patient home on an oral DM medication regimen for simplicity.     MD- When beginning d/c planning, may want to think about starting a DPP-4 inhibitor medication like Tradjenta 5 mg daily  Tradjenta does not have to be renally dosed and operates in a glucose dependent fashion. It works more when glucose levels are elevated and works less when glucose levels are low. Lady Gary has a lower risk for Hypoglycemia at home.  Patient has insurance and this medication would likely be covered.  Would Not recommend Metformin at this time since Metformin acts predominantly on the liver and patient has metastatic cancer to the liver from the colon.  I think the goal of care for patient's elevated blood sugars should focus on safety and not driving patient's blood sugars too low. Patient is already having issues with his appetite and I think it would be in patient's best interest  to possibly be somewhat liberal in blood sugar goals and nutrition guidelines.    **Spoke with patient and his wife this morning using a [name] interpreter.  Discussed his A1c (what it measures, goal A1c, etc) and discussed very basic/survival skills DM information with patient and wife.  Discussed the following with patient and wife: 1. Blood glucose goals at home/when to check CBGs 2. Basic nutrition plan (encouraged more protein, gave information on Glucerna nutritional supplements, discouraged regular soda and fruit juice, encouraged water and milk) 3. Hypoglycemia (signs/symptoms, how to treat at home) 4. How to take oral DM medication 5. Where to get blood glucose meter (pharmacy with MD Rx)   **RD to see patient today as well.  Gave patient two [name] pamphlets on nutrition for home.  Interpreter reviewed them with patient.  **Have asked RN to please allow patient to practice checking fingerstick glucose levels with the hospital lancets and meter before d/c.     Will follow Wyn Quaker RN, MSN, CDE Diabetes Coordinator Inpatient Diabetes Program Team Pager: [phone] (8a-10p)

## 2014-12-30 NOTE — Progress Notes (Signed)
Upon entering the room port tubing was disconnected. Staff notified IV team. Will continue to monitor Roslyn Smiling, RN

## 2014-12-31 LAB — CBC WITH DIFFERENTIAL/PLATELET
Basophils Absolute: 0 10*3/uL (ref 0.0–0.1)
Basophils Relative: 0 % (ref 0–1)
Eosinophils Absolute: 0 10*3/uL (ref 0.0–0.7)
Eosinophils Relative: 0 % (ref 0–5)
HCT: 33 % — ABNORMAL LOW (ref 39.0–52.0)
Hemoglobin: 11.2 g/dL — ABNORMAL LOW (ref 13.0–17.0)
LYMPHS ABS: 2.1 10*3/uL (ref 0.7–4.0)
LYMPHS PCT: 15 % (ref 12–46)
MCH: 28.8 pg (ref 26.0–34.0)
MCHC: 33.9 g/dL (ref 30.0–36.0)
MCV: 84.8 fL (ref 78.0–100.0)
MONO ABS: 1.9 10*3/uL — AB (ref 0.1–1.0)
MONOS PCT: 14 % — AB (ref 3–12)
NEUTROS PCT: 71 % (ref 43–77)
Neutro Abs: 10.2 10*3/uL — ABNORMAL HIGH (ref 1.7–7.7)
PLATELETS: 217 10*3/uL (ref 150–400)
RBC: 3.89 MIL/uL — ABNORMAL LOW (ref 4.22–5.81)
RDW: 14.6 % (ref 11.5–15.5)
WBC: 14.3 10*3/uL — ABNORMAL HIGH (ref 4.0–10.5)

## 2014-12-31 LAB — BASIC METABOLIC PANEL
Anion gap: 6 (ref 5–15)
BUN: 8 mg/dL (ref 6–23)
CO2: 26 mmol/L (ref 19–32)
Calcium: 8 mg/dL — ABNORMAL LOW (ref 8.4–10.5)
Chloride: 99 mmol/L (ref 96–112)
Creatinine, Ser: 0.85 mg/dL (ref 0.50–1.35)
GFR, EST NON AFRICAN AMERICAN: 89 mL/min — AB (ref >90)
Glucose, Bld: 199 mg/dL — ABNORMAL HIGH (ref 70–99)
POTASSIUM: 3.5 mmol/L (ref 3.5–5.1)
SODIUM: 131 mmol/L — AB (ref 135–145)

## 2014-12-31 LAB — GLUCOSE, CAPILLARY: GLUCOSE-CAPILLARY: 105 mg/dL — AB (ref 70–99)

## 2014-12-31 MED ORDER — HEPARIN SOD (PORK) LOCK FLUSH 100 UNIT/ML IV SOLN
500.0000 [IU] | INTRAVENOUS | Status: AC | PRN
  Administered 2014-12-31: 500 [IU]

## 2014-12-31 MED ORDER — CIPROFLOXACIN HCL 500 MG PO TABS: 500.0000 mg | ORAL_TABLET | Freq: Two times a day (BID) | ORAL | Status: DC

## 2014-12-31 MED ORDER — ONDANSETRON HCL 8 MG PO TABS: 8.0000 mg | ORAL_TABLET | Freq: Three times a day (TID) | ORAL | Status: DC | PRN

## 2014-12-31 MED ORDER — HYDROCODONE-ACETAMINOPHEN 5-325 MG PO TABS: 1.0000 | ORAL_TABLET | ORAL | Status: DC | PRN

## 2014-12-31 NOTE — Discharge Instructions (Signed)
Sepsis °Sepsis is a serious infection of your blood or tissues that affects your whole body. The infection that causes sepsis may be bacterial, viral, fungal, or parasitic. Sepsis may be life threatening. Sepsis can cause your blood pressure to drop. This may result in shock. Shock causes your central nervous system and your organs to stop working correctly.  °RISK FACTORS °Sepsis can happen in anyone, but it is more likely to happen in people who have weakened immune systems. °SIGNS AND SYMPTOMS  °Symptoms of sepsis can include: °· Fever or low body temperature (hypothermia). °· Rapid breathing (hyperventilation). °· Chills. °· Rapid heartbeat (tachycardia). °· Confusion or light-headedness. °· Trouble breathing. °· Urinating much less than usual. °· Cool, clammy skin or red, flushed skin. °· Other problems with the heart, kidneys, or brain. °DIAGNOSIS  °Your health care provider will likely do tests to look for an infection, to see if the infection has spread to your blood, and to see how serious your condition is. Tests can include: °· Blood tests, including cultures of your blood. °· Cultures of other fluids from your body, such as: °¨ Urine. °¨ Pus from wounds. °¨ Mucus coughed up from your lungs. °· Urine tests other than cultures. °· X-ray exams or other imaging tests. °TREATMENT  °Treatment will begin with elimination of the source of infection. If your sepsis is likely caused by a bacterial or fungal infection, you will be given antibiotic or antifungal medicines. °You may also receive: °· Oxygen. °· Fluids through an IV tube. °· Medicines to increase your blood pressure. °· A machine to clean your blood (dialysis) if your kidneys fail. °· A machine to help you breathe if your lungs fail. °SEEK IMMEDIATE MEDICAL CARE IF: °You get an infection or develop any of the signs and symptoms of sepsis after surgery or a hospitalization. °Document Released: 07/13/2003 Document Revised: 10/19/2013 Document Reviewed:  06/21/2013 °Exit[employer]® Patient Information ©2015 [employer]. This information is not intended to replace advice given to you by your health care provider. Make sure you discuss any questions you have with your health care provider. ° °

## 2014-12-31 NOTE — Discharge Summary (Signed)
Physician Discharge Summary  Jason Reilly SAY:[id number] DOB: 1946-08-19 DOA: 12/28/2014  PCP: [name], MD  Admit date: 12/28/2014 Discharge date: 12/31/2014  Recommendations for Outpatient Follow-up:  1. Pt will need to follow up with PCP in 2-3 weeks post discharge 2. Please obtain BMP to evaluate electrolytes and kidney function 3. Please also check CBC to evaluate Hg and Hct levels 4. Please note that BP medications were held upon discharge due to BP 90'60's 5. Pt advised to call his oncologist on Monday to schedule an appointment  6. Please note that A1C is 8.5, tradjenda was recommended upon discharge but pt wants to discuss with PCP and oncologist first, I called script to pharmacy   Discharge Diagnoses:  Principal Problem:   Weakness Active Problems:   Metastatic colon cancer to liver   Acute renal failure   Hyponatremia   Hyperglycemia   Hypertension    Discharge Condition: Stable  Diet recommendation: Heart healthy diet discussed in details     Brief narrative:    Pt is 69 yo male with known HTN, dilated non ischemic cardiomyopathy with last known EF 25 % based on 2 D ECHO in 2014 (no records of this in the EPIC), Hep B (treatement not recommended based on the viral load), recent dx of colon cancer with mets to liver, from [place], speaks very little English, presented to Cancer center for further evaluation of weakness and dehydration. Please note that history is limited due to difficulty with Jason Reilly, interpreter not available at the time of the admission. Wife who was at bedside does not speak Jason Reilly. Pt has seen his oncologist [name] earlier in the day and she has asked TRH to have pt admitted as his blood tests were notable for acute renal failure Cr 1.6, BP in low 90's/60's with HR in 110's. Pt did report he has broken out in rash several days ago   Pt was scheduled for chemo today but his son called the office saying pt feels very tired, has not been  eating or drinking much over the past weeks. Son reported that pt has los over 20 lbs in the past 3 months. CT ABD 07/22/2014 revealed multiple large partially calcified hepatic masses consistent with metastatic disease, retroperitoneal adenopathy with the largest node measuring 1.6 cm, nonspecific 1.4 cm left adrenal nodule. His tumor marker showed CEA > 10,000, CA 19-9 12,929, AFP 3.2 (normal).   Major events: 3/3 - transfer from SDU to telemetry bed, started Neupogen  3/4 - interpreter used   Assessment/Plan:    Principal Problem:  Weakness - secondary to metastatic colon cancer, chemotherapy, failure to thrive, dehydration, Sepsis  - Patient reports feeling better this morning and looks clinically stable - Blood pressure stable - pt wants to go home today  Active Problems:  Sepsis - criteria for sepsis met on admission, source still unclear  - BP 73/31, RR 22, HR 113, T 99.25F in immunocompromised pt on chemotx, WBC 3.5, elevated lactic acid  - possibly UTI based on urinalysis with trace leukocytes, final urine culture report pending - Chest x-ray with no signs of pneumonia, blood cultures pending but no growth to date  - patient looks clinically stable and reports feeling better this morning, afebrile over the past 24 hours  - continued Vanc and Zosyn day #4 and will transition to oral Cipro upon discharge   Leukopenia - Neupogen started 3/3, WBC up now  - WBC trending since admission: 3.5 --> 3.0 --> 13  Anemia of  chronic disease, malignancy  - Hemoglobin on admission 13, likely from hemoconcentration in the setting of dehydration - hemoglobin is at baseline this morning - Review of records indicated hemoglobin at baseline is 10 - 11  Dilated cardiomyopathy, chronic systolic CHF - with apparent EF 25 % in 2014 but no 2 D ECHO on file  - weight trend since admission: 120 lbs --> 124 lbs --> 125 lbs - no ACEI/ARB due to hypotension   Metastatic colon cancer to  liver - per oncology team  - Oncologist notified of patient's admission  Acute renal failure - secondary to pre renal etiology from poor oral intake - IV fluids provided and creatinine is now within normal limits - continue to hold Lisinopril - HCTZ that pt takes at home   Hyponatremia - stable overall  DM type II  - A1c 8.2, patient with new diagnosis of diabetes - SSI provided upon discharge but pt does not want any medication at this time and wants to discuss with PCP   Rash - chemo side effect - pt was advised to avoid sun exposure but apparently has not followed recommendations - supportive care as needed   Severe PCM - in the setting of acute on chronic illness - advanced diet, pt able to tolerate    Code Status: Full.  Family Communication: plan of care discussed with the patient, interpreter at bedside  Disposition Plan: Home  IV access:  Peripheral IV  Procedures and diagnostic studies:    Imaging Results    Dg Chest Port 1 View  12/28/2014 CLINICAL DATA: Sudden onset dyspnea, metastatic colon cancer, hypertension, hepatitis-B EXAM: PORTABLE CHEST - 1 VIEW COMPARISON: Portable exam 1953 hours compared to 03/23/2013 FINDINGS: RIGHT jugular Port-A-Cath with tip projecting over SVC. Normal heart size, mediastinal contours, and pulmonary vascularity. Lungs clear. No pleural effusion or pneumothorax. Bones unremarkable. IMPRESSION: No acute abnormalities. Electronically Signed By: Lavonia [name] M.D. On: 12/28/2014 20:12     Medical Consultants:  Oncology  Other Consultants:  None  IAnti-Infectives:   Vanc 3/2 --> 3/5 Zosyn 3/2 --> 3/5       Discharge Exam: Filed Vitals:   12/31/14 0605  BP: 99/56  Pulse: 101  Temp: 99 F (37.2 C)  Resp: 20   Filed Vitals:   12/30/14 0656 12/30/14 1433 12/30/14 2145 12/31/14 0605  BP: 98/52 108/61 118/68 99/56  Pulse: 102 89 97 101  Temp: 98.1 F (36.7 C) 97.9 F (36.6 C)  97.6 F (36.4 C) 99 F (37.2 C)  TempSrc: Oral Oral Oral Oral  Resp: _0 Height:      Weight:    55.021 kg (121 lb 4.8 oz)  SpO2: 96% 97% 97% 100%    General: Pt is alert, follows commands appropriately, not in acute distress Cardiovascular: Regular rate and rhythm, S1/S2 +, no murmurs, no rubs, no gallops Respiratory: Clear to auscultation bilaterally, no wheezing, no crackles, no rhonchi Abdominal: Soft, non tender, non distended, bowel sounds +, no guarding Extremities: no edema, no cyanosis, pulses palpable bilaterally DP and PT Neuro: Grossly nonfocal  Discharge Instructions  Discharge Instructions    Diet - low sodium heart healthy    Complete by:  As directed      Increase activity slowly    Complete by:  As directed             Medication List    STOP taking these medications        lisinopril-hydrochlorothiazide 10-12.5 MG per  tablet  Commonly known as:  PRINZIDE,ZESTORETIC     metoprolol 50 MG tablet  Commonly known as:  LOPRESSOR     metoprolol-hydrochlorothiazide 100-25 MG per tablet  Commonly known as:  LOPRESSOR HCT      TAKE these medications        aspirin EC 81 MG tablet  Take 1 tablet (81 mg total) by mouth daily.     ciprofloxacin 500 MG tablet  Commonly known as:  CIPRO  Take 1 tablet (500 mg total) by mouth 2 (two) times daily.     HYDROcodone-acetaminophen 5-325 MG per tablet  Commonly known as:  NORCO/VICODIN  Take 1-2 tablets by mouth every 4 (four) hours as needed for moderate pain.     lidocaine-prilocaine cream  Commonly known as:  EMLA  Apply 1 application topically as needed. Apply to [facility] cath at least one hour before needle stick.     linagliptin 5 MG Tabs tablet  Commonly known as:  TRADJENTA  Take 1 tablet (5 mg total) by mouth daily.     mirtazapine 15 MG tablet  Commonly known as:  REMERON  Take 1 tablet (15 mg total) by mouth at bedtime.     ondansetron 8 MG tablet  Commonly known as:  ZOFRAN  Take 1  tablet (8 mg total) by mouth every 8 (eight) hours as needed for nausea or vomiting.            Follow-up Information    Follow up with [name], MD.   Specialty:  Specialist   Contact information:   [place] [address] [phone]       Follow up with Truitt Merle, MD.   Specialties:  Hematology, Oncology   Contact information:   [place] [address] [phone]       Follow up with Faye Ramsay, MD.   Specialty:  Internal Medicine   Why:  As needed, call my cell phone [phone]   Contact information:   [address] [place] [address] [phone]        The results of significant diagnostics from this hospitalization (including imaging, microbiology, ancillary and laboratory) are listed below for reference.     Microbiology: Recent Results (from the past 240 hour(s))  MRSA PCR Screening     Status: None   Collection Time: 12/28/14  2:25 PM  Result Value Ref Range Status   MRSA by PCR NEGATIVE NEGATIVE Final    Comment:        The GeneXpert MRSA Assay (FDA approved for NASAL specimens only), is one component of a comprehensive MRSA colonization surveillance program. It is not intended to diagnose MRSA infection nor to guide or monitor treatment for MRSA infections.   Culture, blood (x 2)     Status: None (Preliminary result)   Collection Time: 12/28/14  8:25 PM  Result Value Ref Range Status   Specimen Description BLOOD RIGHT HAND  Final   Special Requests BOTTLES DRAWN AEROBIC ONLY 10CC  Final   Culture   Final           BLOOD CULTURE RECEIVED NO GROWTH TO DATE CULTURE WILL BE HELD FOR 5 DAYS BEFORE ISSUING A FINAL NEGATIVE REPORT Performed at [facility]    Report Status PENDING  Incomplete  Culture, blood (x 2)     Status: None (Preliminary result)   Collection Time: 12/28/14  8:35 PM  Result Value Ref Range Status  Specimen Description BLOOD LEFT ARM  Final    Special Requests BOTTLES DRAWN AEROBIC AND ANAEROBIC 10CC  Final   Culture   Final           BLOOD CULTURE RECEIVED NO GROWTH TO DATE CULTURE WILL BE HELD FOR 5 DAYS BEFORE ISSUING A FINAL NEGATIVE REPORT Performed at [facility]    Report Status PENDING  Incomplete     Labs: Basic Metabolic Panel:  Recent Labs Lab 12/28/14 1106 12/29/14 0530 12/30/14 0600 12/31/14 0850  NA 127* 126* 129* 131*  K 4.3 3.5 3.5 3.5  CL  --  96 96 99  CO2 _0 GLUCOSE 233* 123* 97 199*  BUN 29.3* _1 CREATININE 1.6* 0.94 0.96 0.85  CALCIUM 9.1 7.6* 8.1* 8.0*   Liver Function Tests:  Recent Labs Lab 12/28/14 1106 12/30/14 0600  AST 26 27  ALT 14 16  ALKPHOS 213* 175*  BILITOT 1.29* 1.1  PROT 7.1 6.5  ALBUMIN 2.6* 2.4*   No results for input(s): LIPASE, AMYLASE in the last 168 hours. No results for input(s): AMMONIA in the last 168 hours. CBC:  Recent Labs Lab 12/28/14 1105 12/29/14 0530 12/30/14 0600 12/31/14 0400  WBC 3.5* 3.0* 13.0* 14.3*  NEUTROABS 0.4*  --  8.8* 10.2*  HGB 13.4 10.8* 11.3* 11.2*  HCT 38.4 32.2* 32.6* 33.0*  MCV 84.6 84.3 84.2 84.8  PLT 233 162 208 217    CBG:  Recent Labs Lab 12/29/14 2358 12/30/14 0737 12/30/14 1202 12/30/14 1656 12/30/14 2141  GLUCAP 124* 108* 117* 111* 190*     SIGNED: Time coordinating discharge: Over 30 minutes  Faye Ramsay, MD  [facility] 12/31/2014, 10:15 AM Pager [phone]  If 7PM-7AM, please contact night-coverage [email or web address] Password [email or web address]

## 2015-01-02 MED ORDER — LINAGLIPTIN 5 MG PO TABS: 5.0000 mg | ORAL_TABLET | Freq: Every day | ORAL | Status: DC

## 2015-01-03 ENCOUNTER — Telehealth: Payer: Self-pay | Admitting: *Deleted

## 2015-01-03 NOTE — Telephone Encounter (Signed)
Received call from Maudie Mercury, daughter in-law wanting to talk to nurse about pt's diabetic medications - very high copay.  Spoke with Maudie Mercury, and instructed Kim to call pt's primary md for discussion of pt's diabetic medication.   Kim voiced understanding.

## 2015-01-04 ENCOUNTER — Telehealth: Payer: Self-pay | Admitting: Hematology

## 2015-01-04 ENCOUNTER — Other Ambulatory Visit: Payer: Self-pay | Admitting: *Deleted

## 2015-01-04 LAB — CULTURE, BLOOD (ROUTINE X 2)
CULTURE: NO GROWTH
CULTURE: NO GROWTH

## 2015-01-04 NOTE — Telephone Encounter (Signed)
Confirm appointment for 03/16

## 2015-01-05 ENCOUNTER — Encounter (HOSPITAL_COMMUNITY): Payer: Self-pay

## 2015-01-05 ENCOUNTER — Emergency Department (HOSPITAL_COMMUNITY)
Admission: EM | Admit: 2015-01-05 | Discharge: 2015-01-06 | Disposition: A | Discharge status: Home / Self Care | Payer: Medicare Other | Attending: Emergency Medicine | Admitting: Emergency Medicine

## 2015-01-05 DIAGNOSIS — K1379 Other lesions of oral mucosa: Secondary | ICD-10-CM

## 2015-01-05 DIAGNOSIS — I10 Essential (primary) hypertension: Secondary | ICD-10-CM | POA: Insufficient documentation

## 2015-01-05 DIAGNOSIS — Z7982 Long term (current) use of aspirin: Secondary | ICD-10-CM | POA: Insufficient documentation

## 2015-01-05 DIAGNOSIS — Z79899 Other long term (current) drug therapy: Secondary | ICD-10-CM | POA: Insufficient documentation

## 2015-01-05 DIAGNOSIS — K006 Disturbances in tooth eruption: Secondary | ICD-10-CM | POA: Diagnosis not present

## 2015-01-05 DIAGNOSIS — Z8619 Personal history of other infectious and parasitic diseases: Secondary | ICD-10-CM | POA: Diagnosis not present

## 2015-01-05 DIAGNOSIS — Z9889 Other specified postprocedural states: Secondary | ICD-10-CM | POA: Insufficient documentation

## 2015-01-05 DIAGNOSIS — Z87891 Personal history of nicotine dependence: Secondary | ICD-10-CM | POA: Diagnosis not present

## 2015-01-05 NOTE — ED Notes (Signed)
Pt complains of bleeding from his mouth for one hour, no injury

## 2015-01-06 NOTE — ED Provider Notes (Signed)
CSN: [id number]     Arrival date & time 01/05/15  2305 History   First MD Initiated Contact with Patient 01/05/15 2323     Chief Complaint  Patient presents with  . Oral Pain     (Consider location/radiation/quality/duration/timing/severity/associated sxs/prior Treatment) Patient is a 69 y.o. male presenting with mouth pain. The history is provided by the patient and a relative. A language interpreter was used Public house manager is family member at bedside.).  Oral Pain This is a new problem. Pertinent negatives include no fever. Associated symptoms comments: Patient is here for evaluation of intraoral bleeding that started earlier tonight. He does not complain of significant pain. Per family member here, sister gave him something to rinse his mouth with that seemed to stop bleeding wtihout recurrence. .    Past Medical History  Diagnosis Date  . Tachycardia   . Abnormal EKG   . Hypertension   . Non-ischemic cardiomyopathy   . Hepatitis B    Past Surgical History  Procedure Laterality Date  . Nuclear stress test  03/03/2013    High risk - consistent with nonischemic cardiomyopathy  . Left and right heart catheterization with coronary angiogram N/A 03/29/2013    Procedure: LEFT AND RIGHT HEART CATHETERIZATION WITH CORONARY ANGIOGRAM;  Surgeon: Pixie Casino, MD;  Location: [facility] CATH LAB;  Service: Cardiovascular;  Laterality: N/A;   History reviewed. No pertinent family history. History  Substance Use Topics  . Smoking status: Former Research scientist (life sciences)  . Smokeless tobacco: Never Used  . Alcohol Use: Yes     Comment: occasional    Review of Systems  Constitutional: Negative for fever.  HENT: Positive for dental problem.       Allergies  Review of patient's allergies indicates no known allergies.  Home Medications   Prior to Admission medications   Medication Sig Start Date End Date Taking? Authorizing Provider  aspirin EC 81 MG tablet Take 1 tablet (81 mg total) by mouth daily. 03/09/13   Yes Pixie Casino, MD  ciprofloxacin (CIPRO) 500 MG tablet Take 1 tablet (500 mg total) by mouth 2 (two) times daily. 12/31/14  Yes Theodis Blaze, MD  HYDROcodone-acetaminophen (NORCO/VICODIN) 5-325 MG per tablet Take 1-2 tablets by mouth every 4 (four) hours as needed for moderate pain. 12/31/14  Yes Theodis Blaze, MD  linagliptin (TRADJENTA) 5 MG TABS tablet Take 1 tablet (5 mg total) by mouth daily. 01/02/15  Yes Theodis Blaze, MD  mirtazapine (REMERON) 15 MG tablet Take 1 tablet (15 mg total) by mouth at bedtime. 11/02/14  Yes Truitt Merle, MD  ondansetron (ZOFRAN) 8 MG tablet Take 1 tablet (8 mg total) by mouth every 8 (eight) hours as needed for nausea or vomiting. 12/31/14  Yes Theodis Blaze, MD  lidocaine-prilocaine (EMLA) cream Apply 1 application topically as needed. Apply to [facility] cath at least one hour before needle stick. Patient not taking: Reported on 12/28/2014 11/30/14   Truitt Merle, MD   BP 138/72 mmHg  Pulse 132  Temp(Src) 100.2 F (37.9 C) (Oral)  Resp 20  Ht 5\' 1"  (1.549 m)  Wt 124 lb (56.246 kg)  BMI 23.44 kg/m2  SpO2 95% Physical Exam  Constitutional: He is oriented to person, place, and time. He appears well-developed and well-nourished.  HENT:  Clot along lower right dental ridge that does not clear with rinsing. No active bleeding. Generally poor dentition. No abscess.   Neck: Normal range of motion.  Pulmonary/Chest: Effort normal.  Musculoskeletal: Normal range of motion.  Neurological: He is alert and oriented to person, place, and time.  Skin: Skin is warm and dry.  Psychiatric: He has a normal mood and affect.    ED Course  Procedures (including critical care time) Labs Review Labs Reviewed - No data to display  Imaging Review No results found.   EKG Interpretation None      MDM   Final diagnoses:  None    1. Oral bleeding, resolved  No further bleeding in ED. He is in NAD. VSS. Discussed dental follow up.    Charlann Lange, PA-C 01/06/15  [place], DO 01/06/15 [phone]

## 2015-01-06 NOTE — Discharge Instructions (Signed)
FOLLOW UP WITH DENTISTRY FOR FURTHER EVALUATION OF BLEEDING FROM GUMS. RETURN HERE AS NEEDED.   Emergency Department Resource Guide 1) Find a Doctor and Pay Out of Pocket Although you won't have to find out who is covered by your insurance plan, it is a good idea to ask around and get recommendations. You will then need to call the office and see if the doctor you have chosen will accept you as a new patient and what types of options they offer for patients who are self-pay. Some doctors offer discounts or will set up payment plans for their patients who do not have insurance, but you will need to ask so you aren't surprised when you get to your appointment.  2) Contact Your Local Health Department Not all health departments have doctors that can see patients for sick visits, but many do, so it is worth a call to see if yours does. If you don't know where your local health department is, you can check in your phone book. The CDC also has a tool to help you locate your state's health department, and many state websites also have listings of all of their local health departments.  3) Find a [facility] If your illness is not likely to be very severe or complicated, you may want to try a walk in clinic. These are popping up all over the country in pharmacies, drugstores, and shopping centers. They're usually staffed by nurse practitioners or physician assistants that have been trained to treat common illnesses and complaints. They're usually fairly quick and inexpensive. However, if you have serious medical issues or chronic medical problems, these are probably not your best option.  No Primary Care Doctor: - Call Health Connect at  [phone] - they can help you locate a primary care doctor that  accepts your insurance, provides certain services, etc. - Physician Referral Service- [phone]  Chronic Pain Problems: Organization         Address  Phone   Notes  [facility]   [phone] Patients need to be referred by their primary care doctor.   Medication Assistance: Organization         Address  Phone   Notes  [facility] Medication [facility] [address] [place], [place] [address] [phone] --Must be a resident of [facility] -- Must have NO insurance coverage whatsoever (no Medicaid/ Medicare, etc.) -- The pt. MUST have a primary care doctor that directs their care regularly and follows them in the community   [facility]ist  [phone]   [employer]  [phone]    Agencies that provide inexpensive medical care: Organization         Address  Phone   Notes  [place]  [phone]   [facility]    [phone]   [facility] [place], [place] [address] [phone]   [place] [address], [place] [phone]   Planned Parenthood    [phone]   [facility]    [phone]   [place] and [place] [address], [place] Phone:  [phone], Fax:  [phone] Hours of Operation:  9 am - 6 pm, M-F.  Also accepts Medicaid/Medicare and self-pay.  [facility] for [place], [address], [place] Phone: [phone], Fax: [phone]. Hours of Operation:  8:30 am - 5:30 pm, M-F.  Also accepts Medicaid and self-pay.  [facility] [place] [address] [place] Phone: [phone]   [place], [place], [place] [phone], Ext. [phone] Mondays & Thursdays: 7-9 AM.  First 15 patients are seen on a first come, first serve basis.    [place] Providers:  Organization         Address  Phone   Notes  [facility] [address], [place] [phone] Also accepts self-pay patients.  [facility] [address], [place]  [phone]   [place], [address], [place] [phone]   [facility] [address], [place] [phone]   Lucianne Lei [address], [place]   [phone] Only accepts [place] Access [place] patients after they have their name applied to their card.   Self-Pay (no insurance) in [facility]:  Organization         Address  Phone   Notes  Sickle Cell Patients, [facility] Internal Medicine [place] [phone]   [facility] [place] [phone]   [facility] [place]  [place], [employer], [place] [phone]   [facility]/Dr. Osei-Bonsu  [address] [place] or [address], [place] [phone] Phone number for both [place] and [place] locations is the same.  Urgent Medical and [facility] [address], [place] [phone]   [facility] [address] [place] [address], [place] or [address] [phone]   [facility] [address] [place] [phone], phone; [phone], fax Sees patients 1st and 3rd Saturday of every month.  Must not qualify for public or private insurance (i.e. Medicaid, Medicare, [place] Health Choice, Veterans' Benefits)  Household income should be no more than 200% of the poverty level The clinic cannot treat you if you are pregnant or think you are pregnant  Sexually transmitted diseases are not treated at the clinic.    Dental Care: Organization         Address  Phone  Notes  [facility] Department of [facility] [place] [phone] Accepts children up to age 24 who are enrolled in [place] or [place]; pregnant women with a Medicaid card; and children who have applied for Medicaid or [facility]  Choice, but were declined, whose parents can pay a reduced fee at time of service.  [facility] Department of [facility]  [address], [place] [phone] Accepts children up to age 36 who are enrolled in [place] or [place]; pregnant women with a Medicaid card; and children who have applied for Medicaid or [place] Health Choice, but were declined, whose parents can pay a reduced fee at time of service.  [place] [facility] PRO[facility]  [place] [phone] Patients are seen by appointment only. Walk-ins are not accepted. [place] will see patients 64 years of age and older. Monday - Tuesday (8am-5pm) Most Wednesdays (8:30-5pm) $30 per visit, cash only  [facility] Adult Dental Access [facility]  [address], [facility] [phone] Patients are seen by appointment  only. Walk-ins are not accepted. [place] will see patients 69 years of age and older. One Wednesday Evening (Monthly: Volunteer Based).  $30 per visit, cash only  [place]  [phone] for adults; Children under age 46, call Graduate Pediatric Dentistry at [phone]. Children aged 34-14, please call [phone] to request a pediatric application.  Dental services are provided in all areas of dental care including fillings, crowns and bridges, complete and partial dentures, implants, gum treatment, root canals, and extractions. Preventive care is also provided. Treatment is provided to both adults and children. Patients are selected via a lottery and there is often a waiting list.   [facility] [address] [place]  [phone] [email or web address]   [facility] [address] [place], [place] [phone], Ext. [phone] Second and Fourth Thursday of each month, opens at 6:30 AM; Clinic ends at 9 AM.  Patients are seen on a first-come first-served basis, and a limited number are seen during each  clinic.   [facility]  [address] [place], [place] [phone]   Eligibility Requirements You must have lived in [place], [place], or [place] counties for at least the last three months.   You cannot be eligible for state or federal sponsored [employer], including [employer], [place], or [employer].   You generally cannot be eligible for healthcare insurance through your employer.    How to apply: Eligibility screenings are held every Tuesday and Wednesday afternoon from 1:00 pm until 4:00 pm. You do not need an appointment for the interview!  [facility] [address] [place], [place]   [place]  [place]  [place]  [phone]

## 2015-01-09 ENCOUNTER — Telehealth: Payer: Self-pay | Admitting: *Deleted

## 2015-01-09 NOTE — Telephone Encounter (Signed)
Jason Reilly with [facility] called reporting "we're about to pull his tooth and he does not know his medications.  Could you give [place] the names of what medications he takes?"  This nurse gave the names of medications as listed on EPIC Medication list.  12-14-2014 was last chemotherapy treatment.  Next F/U and treatment scheduled for 01-11-2015.

## 2015-01-11 ENCOUNTER — Ambulatory Visit: Payer: Medicare Other | Admitting: Nutrition

## 2015-01-11 ENCOUNTER — Other Ambulatory Visit (HOSPITAL_BASED_OUTPATIENT_CLINIC_OR_DEPARTMENT_OTHER): Payer: Medicare Other

## 2015-01-11 ENCOUNTER — Ambulatory Visit (HOSPITAL_BASED_OUTPATIENT_CLINIC_OR_DEPARTMENT_OTHER): Payer: Medicare Other

## 2015-01-11 ENCOUNTER — Encounter: Payer: Self-pay | Admitting: Hematology

## 2015-01-11 ENCOUNTER — Telehealth: Payer: Self-pay | Admitting: *Deleted

## 2015-01-11 ENCOUNTER — Ambulatory Visit (HOSPITAL_BASED_OUTPATIENT_CLINIC_OR_DEPARTMENT_OTHER): Payer: Medicare Other | Admitting: Hematology

## 2015-01-11 ENCOUNTER — Other Ambulatory Visit: Payer: BLUE CROSS/BLUE SHIELD

## 2015-01-11 VITALS — BP 131/78 | HR 91 | Temp 98.5°F | Resp 18 | Ht 61.0 in | Wt 130.8 lb

## 2015-01-11 DIAGNOSIS — D701 Agranulocytosis secondary to cancer chemotherapy: Secondary | ICD-10-CM

## 2015-01-11 DIAGNOSIS — E119 Type 2 diabetes mellitus without complications: Secondary | ICD-10-CM

## 2015-01-11 DIAGNOSIS — C7800 Secondary malignant neoplasm of unspecified lung: Secondary | ICD-10-CM

## 2015-01-11 DIAGNOSIS — I10 Essential (primary) hypertension: Secondary | ICD-10-CM

## 2015-01-11 DIAGNOSIS — C7970 Secondary malignant neoplasm of unspecified adrenal gland: Secondary | ICD-10-CM

## 2015-01-11 DIAGNOSIS — C787 Secondary malignant neoplasm of liver and intrahepatic bile duct: Principal | ICD-10-CM

## 2015-01-11 DIAGNOSIS — C189 Malignant neoplasm of colon, unspecified: Secondary | ICD-10-CM

## 2015-01-11 DIAGNOSIS — C187 Malignant neoplasm of sigmoid colon: Secondary | ICD-10-CM

## 2015-01-11 DIAGNOSIS — I255 Ischemic cardiomyopathy: Secondary | ICD-10-CM

## 2015-01-11 DIAGNOSIS — L270 Generalized skin eruption due to drugs and medicaments taken internally: Secondary | ICD-10-CM

## 2015-01-11 DIAGNOSIS — Z5111 Encounter for antineoplastic chemotherapy: Secondary | ICD-10-CM

## 2015-01-11 LAB — COMPREHENSIVE METABOLIC PANEL (CC13)
ALBUMIN: 2.4 g/dL — AB (ref 3.5–5.0)
ALK PHOS: 251 U/L — AB (ref 40–150)
ALT: 24 U/L (ref 0–55)
AST: 47 U/L — ABNORMAL HIGH (ref 5–34)
Anion Gap: 8 mEq/L (ref 3–11)
BILIRUBIN TOTAL: 0.46 mg/dL (ref 0.20–1.20)
BUN: 12.1 mg/dL (ref 7.0–26.0)
CO2: 30 mEq/L — ABNORMAL HIGH (ref 22–29)
Calcium: 9.5 mg/dL (ref 8.4–10.4)
Chloride: 99 mEq/L (ref 98–109)
Creatinine: 0.8 mg/dL (ref 0.7–1.3)
EGFR: >90 mL/min/{1.73_m2} (ref >90)
GLUCOSE: 98 mg/dL (ref 70–140)
Potassium: 4.5 mEq/L (ref 3.5–5.1)
Sodium: 137 mEq/L (ref 136–145)
TOTAL PROTEIN: 8.5 g/dL — AB (ref 6.4–8.3)

## 2015-01-11 LAB — CBC WITH DIFFERENTIAL/PLATELET
BASO%: 0.9 % (ref 0.0–2.0)
BASOS ABS: 0.1 10*3/uL (ref 0.0–0.1)
EOS%: 0.9 % (ref 0.0–7.0)
Eosinophils Absolute: 0.1 10*3/uL (ref 0.0–0.5)
HEMATOCRIT: 32.1 % — AB (ref 38.4–49.9)
HEMOGLOBIN: 10.5 g/dL — AB (ref 13.0–17.1)
LYMPH%: 18.9 % (ref 14.0–49.0)
MCH: 28.5 pg (ref 27.2–33.4)
MCHC: 32.7 g/dL (ref 32.0–36.0)
MCV: 87.1 fL (ref 79.3–98.0)
MONO#: 1 10*3/uL — ABNORMAL HIGH (ref 0.1–0.9)
MONO%: 11.2 % (ref 0.0–14.0)
NEUT#: 5.8 10*3/uL (ref 1.5–6.5)
NEUT%: 68.1 % (ref 39.0–75.0)
PLATELETS: 439 10*3/uL — AB (ref 140–400)
RBC: 3.68 10*6/uL — ABNORMAL LOW (ref 4.20–5.82)
RDW: 15.5 % — ABNORMAL HIGH (ref 11.0–14.6)
WBC: 8.5 10*3/uL (ref 4.0–10.3)
lymph#: 1.6 10*3/uL (ref 0.9–3.3)

## 2015-01-11 MED ORDER — CLINDAMYCIN PHOSPHATE 1 % EX GEL: Freq: Two times a day (BID) | CUTANEOUS | Status: DC

## 2015-01-11 MED ORDER — HYDROCORTISONE 2.5 % EX CREA: TOPICAL_CREAM | Freq: Two times a day (BID) | CUTANEOUS | Status: DC

## 2015-01-11 MED ORDER — FLUOROURACIL CHEMO INJECTION 2.5 GM/50ML
400.0000 mg/m2 | Freq: Once | INTRAVENOUS | Status: AC
  Administered 2015-01-11: 600 mg via INTRAVENOUS
  Filled 2015-01-11: qty 12

## 2015-01-11 MED ORDER — SODIUM CHLORIDE 0.9 % IV SOLN
Freq: Once | INTRAVENOUS | Status: AC
  Administered 2015-01-11: 10:00:00 via INTRAVENOUS
  Filled 2015-01-11: qty 4

## 2015-01-11 MED ORDER — SODIUM CHLORIDE 0.9 % IV SOLN
2400.0000 mg/m2 | INTRAVENOUS | Status: DC
  Administered 2015-01-11: 3750 mg via INTRAVENOUS
  Filled 2015-01-11: qty 75

## 2015-01-11 MED ORDER — OXALIPLATIN CHEMO INJECTION 100 MG/20ML
85.0000 mg/m2 | Freq: Once | INTRAVENOUS | Status: AC
  Administered 2015-01-11: 135 mg via INTRAVENOUS
  Filled 2015-01-11: qty 27

## 2015-01-11 MED ORDER — SODIUM CHLORIDE 0.9 % IJ SOLN
10.0000 mL | INTRAMUSCULAR | Status: DC | PRN
  Filled 2015-01-11: qty 10

## 2015-01-11 MED ORDER — DEXTROSE 5 % IV SOLN
Freq: Once | INTRAVENOUS | Status: AC
  Administered 2015-01-11: 10:00:00 via INTRAVENOUS

## 2015-01-11 MED ORDER — LEUCOVORIN CALCIUM INJECTION 350 MG
398.0000 mg/m2 | Freq: Once | INTRAVENOUS | Status: AC
  Administered 2015-01-11: 620 mg via INTRAVENOUS
  Filled 2015-01-11: qty 31

## 2015-01-11 NOTE — Telephone Encounter (Signed)
Per staff message and POF I have scheduled appts. Advised scheduler of appts. JMW  

## 2015-01-11 NOTE — Progress Notes (Addendum)
[facility]  Telephone:(336) [phone] Fax:([phone]) [place] Note   Patient Care Team: [name], MD as PCP - General (Specialist) [name], CRNP as Nurse Practitioner (Nurse Practitioner) 01/11/2015  CHIEF COMPLAINTS Metastatic colon cancer    Metastatic colon cancer to liver   10/28/2014 Tumor Marker AFP 3.2 CEA > 10,000 CA 19-9 12,929.6. tumor (-) KRAS and NRAS mutation.    11/10/2014 Imaging PET scan showed hypermetabolic mass in the sigmoid colon was noted metastasis in the retroperitoneum. Probable left adrenal and pulmonary metastasis, and diffuse liver metastasis.   11/21/2014 Initial Diagnosis Metastatic colon cancer to liver, lung, abd nodes and left adrenal gland. Diagnosis was made by liver biopsy.    11/30/2014 -  Chemotherapy First line chemo mFOLFOX6, Panitumumab added from second cycle    CURRENT THERAPY: mFOLFOX6 started on 11/30/2014, Panitumumab added from second cycle on 12/14/14  HISTORY OF PRESENTING ILLNESS:  Jason Reilly 69 y.o. male is here because of abnormal CT findings, which is very suspicious for malignancy. He is on ranitidine from [place], has been on in the [place] for 16 years. He came in with his son and an interpreter.  He has been feeling fatigued since two month ago. He is still able to do all ADLs. He otherwise denies any pain, bloating or nausea.  He lost about 20lbs in 3 month. His appetite is lower than before, eats less, no change of his bowl habits.  She denied any hematochezia or melana. Per his son, he has had some personality changes daily, irritable, slightly confused some time.  He was evaluated by his primary care physician. Lab test reviewed hepatitis B infection, which he did not know before, and elevated alkaline phosphatase, his liver function and the rest of the liver function was not remarkable. [place] of abdomen was obtained on 07/22/2014, which showed diffusely abnormal liver with multiple echogenic lesions. CT  of abdomen with and without contrast was done on 08/26/2014, which reviewed here at a medically with multiple large partially calcified hepatic masses consistent with metastatic disease. Mild retroperitoneal adenopathy with the largest node measuring 1.6 cm. And nonspecific 1.4 cm left adrenal nodule was also noticed. His tumor marker showed CEA greater than 10,000, CA 19-9 12,929, AFP 3.2 (normal). He was referred to [facility] liver clinic and was evaluated by nurse practitioner [name]. Treatment for hepatitis B was not recommended based on his virus load.  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 He was evaluated by a cardiologist in 2014. He denies any significant dyspnea on exertion. No leg swollen.  INTERIM HISTORY: [name] returns for follow-up and cycle 3 chemotherapy. He was admitted to hospital on 3/3 after prior cycle of chemotherapy for dehydration and neutropenic fever. He was treated for UTI was brought antibiotics, and was discharged with Cipro. He recovered well. He has very good appetite, eating well lately. He denies any pain or other symptoms. His skin rash is also improved, no itchiness. He was found to have hyperglycemia during his hospital stay now on oral diabetic medication.  MEDICAL HISTORY:  Past Medical History  Diagnosis Date  . Tachycardia   . Abnormal EKG   . Hypertension   . Non-ischemic cardiomyopathy   . Hepatitis B     SURGICAL HISTORY: Past Surgical History  Procedure Laterality Date  . Nuclear stress test  03/03/2013    High risk - consistent with nonischemic cardiomyopathy  . Left and right heart  catheterization with coronary angiogram N/A 03/29/2013    Procedure: LEFT AND RIGHT HEART CATHETERIZATION WITH CORONARY ANGIOGRAM;  Surgeon: Pixie Casino, MD;  Location: [facility] CATH LAB;  Service: Cardiovascular;  Laterality: N/A;    SOCIAL HISTORY: History   Social History  . Marital Status: Unknown     Spouse Name: N/A  . Number of Children: N/A  . Years of Education: N/A   Occupational History  . Not on file.   Social History Main Topics  . Smoking status: Former Research scientist (life sciences)  . Smokeless tobacco: Never Used  . Alcohol Use: Yes     Comment: occasional  . Drug Use: No  . Sexual Activity: No   Other Topics Concern  . Not on file   Social History Narrative    FAMILY HISTORY: No family history of liver disease or malignancy.  ALLERGIES:  has No Known Allergies.  MEDICATIONS:  Current Outpatient Prescriptions  Medication Sig Dispense Refill  . aspirin EC 81 MG tablet Take 1 tablet (81 mg total) by mouth daily. (Patient not taking: Reported on 11/02/2014) 90 tablet 3  . lisinopril-hydrochlorothiazide (PRINZIDE,ZESTORETIC) 10-12.5 MG per tablet TAKE 1 TABLET BY MOUTH EVERY DAY (Patient not taking: Reported on 11/02/2014) 90 tablet 3  . metoprolol (LOPRESSOR) 50 MG tablet TAKE 1 TABLET BY MOUTH TWICE A DAY (Patient not taking: Reported on 11/02/2014) 180 tablet 3  . metoprolol succinate (TOPROL-XL) 50 MG 24 hr tablet Take 50 mg by mouth 2 (two) times daily. Take with or immediately following a meal.    .        No current facility-administered medications for this visit.    REVIEW OF SYSTEMS:   Constitutional: Denies fevers, chills or abnormal night sweats, (+) fatigue  Eyes: Denies blurriness of vision, double vision or watery eyes Ears, nose, mouth, throat, and face: Denies mucositis or sore throat Respiratory: Denies cough, dyspnea or wheezes Cardiovascular: Denies palpitation, chest discomfort or lower extremity swelling Gastrointestinal: (+) anorexia.   Denies nausea, heartburn or change in bowel habits Skin: Denies abnormal skin rashes Lymphatics: Denies new lymphadenopathy or easy bruising Neurological:Denies numbness, tingling or new weaknesses Behavioral/Psych: Mood is stable, no new changes, (+) insomnia All other systems were reviewed with the patient and are  negative.  PHYSICAL EXAMINATION: BP 131/78 mmHg  Pulse 91  Temp(Src) 98.5 F (36.9 C) (Oral)  Resp 18  Ht '5\' 1"'  (1.549 m)  Wt 130 lb 12.8 oz (59.33 kg)  BMI 24.73 kg/m2  ECOG PERFORMANCE STATUS: 3 Vital sign were taken at in the infusion room, within normal limits. GENERAL:alert, no distress and comfortable SKIN: (+) Dry skin, scatter acne like skin rash es on the face, neck and trunk, better than last time, no skin ulcer or discharge. EYES: normal, conjunctiva are pink and non-injected, sclera clear OROPHARYNX:no exudate, no erythema and lips, buccal mucosa, and tongue normal  NECK: supple, thyroid normal size, non-tender, without nodularity LYMPH:  no palpable lymphadenopathy in the cervical, axillary or inguinal LUNGS: clear to auscultation and percussion with normal breathing effort HEART: regular rate & rhythm and no murmurs and no lower extremity edema ABDOMEN:abdomen soft, non-tender, (+) hepatomegaly, liver is palpable 3.5 cm under rib cage, soft nontender, no splenomegaly and normal bowel sounds Musculoskeletal:no cyanosis of digits and no clubbing  PSYCH: alert & oriented x 3 with fluent speech NEURO: no focal motor/sensory deficits  LABORATORY DATA:  I have reviewed the data as listed Lab Results  Component Value Date   WBC 8.5 01/11/2015  HGB 10.5* 01/11/2015   HCT 32.1* 01/11/2015   MCV 87.1 01/11/2015   PLT 439* 01/11/2015    Recent Labs  12/28/14 1106  12/29/14 0530 12/30/14 0600 12/31/14 0850 01/11/15 0813  NA 127*  < > 126* 129* 131* 137  K 4.3  < > 3.5 3.5 3.5 4.5  CL  --   --  96 96 99  --   CO2 26  < > '26 28 26 ' 30*  GLUCOSE 233*  < > 123* 97 199* 98  BUN 29.3*  < > '20 10 8 ' 12.1  CREATININE 1.6*  < > 0.94 0.96 0.85 0.8  CALCIUM 9.1  < > 7.6* 8.1* 8.0* 9.5  GFRNONAA  --   --  85* 84* 89*  --   GFRAA  --   --  >90 >90 >90  --   PROT 7.1  --   --  6.5  --  8.5*  ALBUMIN 2.6*  --   --  2.4*  --  2.4*  AST 26  --   --  27  --  47*  ALT 14  --    --  16  --  24  ALKPHOS 213*  --   --  175*  --  251*  BILITOT 1.29*  --   --  1.1  --  0.46  < > = values in this interval not displayed. AFP 3.2 CEA > 10,000 CA 19-9 12,929.6  Pathology report  Liver, needle/core biopsy - METASTATIC ADENOCARCINOMA, SEE COMMENT. Microscopic Comment The adenocarcinoma demonstrates the following immunophenotype: Cytokeratin 7 - negative expression. Cytokeratin 20 - strong diffuse expression. CD2 - strong diffuse expression. Overall the morphology and immunophenotype are that of metastatic adenocarcinoma primary to colorectum. The recent nuclear medicine scan demonstrating sigmoid mass with associated liver masses is noted.      RADIOGRAPHIC STUDIES: I have personally reviewed the outside CT scan image with patient and his son.   PET 11/10/2014 IMPRESSION: 1. 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 Probable left adrenal and pulmonary metastasis. 2. Widespread hepatic metastasis. Favor secondary to colon primary. The liver does have a somewhat atypical morphology, favored to be related to the extent of metastatic disease. Cirrhosis with synchronous multifocal hepatocellular carcinoma felt less likely.   ASSESSMENT & PLAN:  69 year old [place] male, with past history of hypertension and dilated nonischemic gammopathy with EF 25%, no clinical signs of heart failure, who was found to have hepatitis B infection lately, and multiple liver lesions on the CT scan. He has extremely high CEA and CA 19-9 levels. PET scan reviewed a hypermetabolic sigmoid colon mass, diffuse liver metastasis, probable lung and adrenal gland metastasis.  1. Metastatic sigmoid colon cancer, with diffuse liver, lungs, node and left adrenal gland metastases. KRAS/NRAS wild type, MSI-stable -Liver biopsy showed metastatic adenocarcinoma. His tumor were strongly positive for CK20 and  CD2, consistent with primary colorectal primary. KRAS and NRAS mutations were not detected.  -Pt understands that this is incurable cancer, and he has very high disease burden and overall prognosis is poor. The treatment goal is palliative -His bilirubinemia has improved after first cycle chemotherapy.  -Giving the wild-type KRAS/NRAS genotype, he would benefit from EGFR targeted therapy. He started panitumumab from second cycle chemo. He had G2-3 skin rash with neutropenic fever  -He has recovered well, we'll proceed with chemotherapy FOLFOX today. -His skin rash has not completely resolved, we'll hold panitumumab with this  cycle, and restart with 50% dose reduction with next cycle if his skin rashes office.  3. Grade 2 skin rash  -Secondary to panitumumab -Hydrocortisone and clindamycin gel topical twice daily, prescription was sent to his pharmacy today -So far no signs of your infection.   4. Neutropenic fever after second cycle chemotherapy -We'll add Neulasta with this cycle chemotherapy.  5. Newly diagnosed diabetes -HbA1c was 8.5 -Continue medication, follow up with primary care physician.  5. HTN, Dilated nonischemic ischemia cardiomyopathy with EF 25% -He is clinically doing well without symptoms of CHF. However this is probably going to impact his chemotherapy. I'll try to avoid cardiotoxic chemotherapy agent and avoid fluid overload during chemotherapy. -Continue follow-up with cardiology.  6. Hepatitis B carrier -Per liver clinic, no need for treatment. Follow-up with liver clinic.  7. Malnutrition -I encouraged him to eat more, and take supplements as needed. -Dietitian consult  Plan -Cycle 3 FOLFOX today with full dose, Neulasta support on day 3 -Hold panitumumab today, restart with 50% dose reduction with next cycle if his skin rash resolves. -Return to clinic in 2 weeks, he will be seen Y while the advancedpractitioner here since I'll be out office that week  All  questions were answered. The patient knows to call the clinic with any problems, questions or concerns.  I spent 20 minutes counseling the patient face to face. The total time spent in the appointment was 30 minutes and more than 50% was on counseling.     Truitt Merle, MD 01/11/2015   9:48 AM

## 2015-01-11 NOTE — Patient Instructions (Signed)
[facility] Discharge Instructions for Patients Receiving Chemotherapy  Today you received the following chemotherapy agents: OXaliplatin, Leucovorin, 5FU  To help prevent nausea and vomiting after your treatment, we encourage you to take your nausea medication: Zofran 8 mg every 8 hours as needed.   If you develop nausea and vomiting that is not controlled by your nausea medication, call the clinic.   BELOW ARE SYMPTOMS THAT SHOULD BE REPORTED IMMEDIATELY:  *FEVER GREATER THAN 100.5 F  *CHILLS WITH OR WITHOUT FEVER  NAUSEA AND VOMITING THAT IS NOT CONTROLLED WITH YOUR NAUSEA MEDICATION  *UNUSUAL SHORTNESS OF BREATH  *UNUSUAL BRUISING OR BLEEDING  TENDERNESS IN MOUTH AND THROAT WITH OR WITHOUT PRESENCE OF ULCERS  *URINARY PROBLEMS  *BOWEL PROBLEMS  UNUSUAL RASH Items with * indicate a potential emergency and should be followed up as soon as possible.  Feel free to call the clinic you have any questions or concerns. The clinic phone number is [phone].  Please show the [facility] at check to the Emergency Department and triage nurse.

## 2015-01-11 NOTE — Progress Notes (Signed)
I placed the fmla form for ly thi pham-son of the patient on the desk of [name]

## 2015-01-11 NOTE — Progress Notes (Signed)
Nutrition follow up completed with patient in chemotherapy for metastatic colon cancer. Weight decreased and documented as 130.8 pounds down from 131.75 pounds February 17. Patient reports excellent appetite and denies nutrition impact symptoms at this time. Noted recent HgA1c of 8.2%. Patient remains severely malnourished.  Nutrition Diagnosis: Unintended weight loss continues but is improved.  Intervention: Patient encouraged to continue frequent small meals and snacks with high protein foods. Patient will continue oral nutrition supplements. Provided samples and coupons. Teach back method used.  Monitoring, evaluation, goals: Patient will increase calories and protein to promote weight maintenance.  Next Visit: To be scheduled as needed with treatments.

## 2015-01-12 ENCOUNTER — Encounter: Payer: Self-pay | Admitting: Hematology

## 2015-01-12 LAB — CEA: CEA: 1809.2 ng/mL — AB (ref 0.0–5.0)

## 2015-01-12 NOTE — Progress Notes (Signed)
I called and spoke with Jason Reilly at [phone] and advised her that I have faxed the fmla forms for Jason Reilly(son). I faxed to [phone]

## 2015-01-13 ENCOUNTER — Ambulatory Visit (HOSPITAL_BASED_OUTPATIENT_CLINIC_OR_DEPARTMENT_OTHER): Payer: Medicare Other

## 2015-01-13 DIAGNOSIS — C189 Malignant neoplasm of colon, unspecified: Secondary | ICD-10-CM

## 2015-01-13 DIAGNOSIS — D701 Agranulocytosis secondary to cancer chemotherapy: Secondary | ICD-10-CM

## 2015-01-13 DIAGNOSIS — C187 Malignant neoplasm of sigmoid colon: Secondary | ICD-10-CM

## 2015-01-13 DIAGNOSIS — C787 Secondary malignant neoplasm of liver and intrahepatic bile duct: Principal | ICD-10-CM

## 2015-01-13 DIAGNOSIS — Z452 Encounter for adjustment and management of vascular access device: Secondary | ICD-10-CM

## 2015-01-13 MED ORDER — SODIUM CHLORIDE 0.9 % IJ SOLN
10.0000 mL | INTRAMUSCULAR | Status: DC | PRN
  Administered 2015-01-13: 10 mL
  Filled 2015-01-13: qty 10

## 2015-01-13 MED ORDER — PEGFILGRASTIM INJECTION 6 MG/0.6ML ~~LOC~~
6.0000 mg | PREFILLED_SYRINGE | Freq: Once | SUBCUTANEOUS | Status: AC
  Administered 2015-01-13: 6 mg via SUBCUTANEOUS
  Filled 2015-01-13: qty 0.6

## 2015-01-13 MED ORDER — HEPARIN SOD (PORK) LOCK FLUSH 100 UNIT/ML IV SOLN
500.0000 [IU] | Freq: Once | INTRAVENOUS | Status: AC | PRN
  Administered 2015-01-13: 500 [IU]
  Filled 2015-01-13: qty 5

## 2015-01-13 NOTE — Patient Instructions (Signed)
Fluorouracil, 5FU; Diclofenac topical cream What is this medicine? FLUOROURACIL; DICLOFENAC (flure oh YOOR a sil; dye KLOE fen ak) is a combination of a topical chemotherapy agent and non-steroidal anti-inflammatory drug (NSAID). It is used on the skin to treat skin cancer and skin conditions that could become cancer. This medicine may be used for other purposes; ask your health care provider or pharmacist if you have questions. COMMON BRAND NAME(S): FLUORAC What should I tell my health care provider before I take this medicine? They need to know if you have any of these conditions: -bleeding problems -cigarette smoker -DPD enzyme deficiency -heart disease -high blood pressure -if you frequently drink alcohol containing drinks -kidney disease -liver disease -open or infected skin -stomach problems -swelling or open sores at the treatment site -recent or planned coronary artery bypass graft (CABG) surgery -an unusual or allergic reaction to fluorouracil, diclofenac, aspirin, other NSAIDs, other medicines, foods, dyes, or preservatives -pregnant or trying to get pregnant -breast-feeding How should I use this medicine? This medicine is only for use on the skin. Follow the directions on the prescription label. Wash hands before and after use. Wash affected area and gently pat dry. To apply this medicine use a cotton-tipped applicator, or use gloves if applying with fingertips. If applied with unprotected fingertips, it is very important to wash your hands well after you apply this medicine. Avoid applying to the eyes, nose, or mouth. Apply enough medicine to cover the affected area. You can cover the area with a light gauze dressing, but do not use tight or air-tight dressings. Finish the full course prescribed by your doctor or health care professional, even if you think your condition is better. Do not stop taking except on the advice of your doctor or health care professional. Talk to your  pediatrician regarding the use of this medicine in children. Special care may be needed. Overdosage: If you think you've taken too much of this medicine contact a poison control center or emergency room at once. Overdosage: If you think you have taken too much of this medicine contact a poison control center or emergency room at once. NOTE: This medicine is only for you. Do not share this medicine with others. What if I miss a dose? If you miss a dose, apply it as soon as you can. If it is almost time for your next dose, only use that dose. Do not apply extra doses. Contact your doctor or health care professional if you miss more than one dose. What may interact with this medicine? Interactions are not expected. Do not use any other skin products without telling your doctor or health care professional. This list may not describe all possible interactions. Give your health care provider a list of all the medicines, herbs, non-prescription drugs, or dietary supplements you use. Also tell them if you smoke, drink alcohol, or use illegal drugs. Some items may interact with your medicine. What should I watch for while using this medicine? Visit your doctor or health care professional for checks on your progress. You will need to use this medicine for 2 to 6 weeks. This may be longer depending on the condition being treated. You may not see full healing for another 1 to 2 months after you stop using the medicine. Treated areas of skin can look unsightly during and for several weeks after treatment with this medicine. This medicine can make you more sensitive to the sun. Keep out of the sun. If you cannot avoid being in   the sun, wear protective clothing and use sunscreen. Do not use sun lamps or tanning beds/booths. Where should I keep my What side effects may I notice from receiving this medicine? Side effects that you should report to your doctor or health care professional as soon as possible: -allergic  reactions like skin rash, itching or hives, swelling of the face, lips, or tongue -black or bloody stools, blood in the urine or vomit -blurred vision -chest pain -difficulty breathing or wheezing -redness, blistering, peeling or loosening of the skin, including inside the mouth -severe redness and swelling of normal skin -slurred speech or weakness on one side of the body -trouble passing urine or change in the amount of urine -unexplained weight gain or swelling -unusually weak or tired -yellowing of eyes or skin Side effects that usually do not require medical attention (Report these to your doctor or health care professional if they continue or are bothersome.): -increased sensitivity of the skin to sun and ultraviolet light -pain and burning of the affected area -scaling or swelling of the affected area -skin rash, itching of the affected area -tenderness This list may not describe all possible side effects. Call your doctor for medical advice about side effects. You may report side effects to FDA at 1-800-FDA-1088. Where should I keep my medicine? Keep out of the reach of children. Store at room temperature between 20 and 25 degrees C (68 and 77 degrees F). Throw away any unused medicine after the expiration date. NOTE: This sheet is a summary. It may not cover all possible information. If you have questions about this medicine, talk to your doctor, pharmacist, or health care provider.  2015, [employer]. (2014-02-14 11:09:58) Pegfilgrastim injection What is this medicine? PEGFILGRASTIM (peg fil GRA stim) is a long-acting granulocyte colony-stimulating factor that stimulates the growth of neutrophils, a type of white blood cell important in the body's fight against infection. It is used to reduce the incidence of fever and infection in patients with certain types of cancer who are receiving chemotherapy that affects the bone marrow. This medicine may be used for other  purposes; ask your health care provider or pharmacist if you have questions. COMMON BRAND NAME(S): Neulasta What should I tell my health care provider before I take this medicine? They need to know if you have any of these conditions: -latex allergy -ongoing radiation therapy -sickle cell disease -skin reactions to acrylic adhesives (On-Body Injector only) -an unusual or allergic reaction to pegfilgrastim, filgrastim, other medicines, foods, dyes, or preservatives -pregnant or trying to get pregnant -breast-feeding How should I use this medicine? This medicine is for injection under the skin. If you get this medicine at home, you will be taught how to prepare and give the pre-filled syringe or how to use the On-body Injector. Refer to the patient Instructions for Use for detailed instructions. Use exactly as directed. Take your medicine at regular intervals. Do not take your medicine more often than directed. It is important that you put your used needles and syringes in a special sharps container. Do not put them in a trash can. If you do not have a sharps container, call your pharmacist or healthcare provider to get one. Talk to your pediatrician regarding the use of this medicine in children. Special care may be needed. Overdosage: If you think you have taken too much of this medicine contact a poison control center or emergency room at once. NOTE: This medicine is only for you. Do not share this medicine with  others. What if I miss a dose? It is important not to miss your dose. Call your doctor or health care professional if you miss your dose. If you miss a dose due to an On-body Injector failure or leakage, a new dose should be administered as soon as possible using a single prefilled syringe for manual use. What may interact with this medicine? Interactions have not been studied. Give your health care provider a list of all the medicines, herbs, non-prescription drugs, or dietary  supplements you use. Also tell them if you smoke, drink alcohol, or use illegal drugs. Some items may interact with your medicine. This list may not describe all possible interactions. Give your health care provider a list of all the medicines, herbs, non-prescription drugs, or dietary supplements you use. Also tell them if you smoke, drink alcohol, or use illegal drugs. Some items may interact with your medicine. What should I watch for while using this medicine? You may need blood work done while you are taking this medicine. If you are going to need a MRI, CT scan, or other procedure, tell your doctor that you are using this medicine (On-Body Injector only). What side effects may I notice from receiving this medicine? Side effects that you should report to your doctor or health care professional as soon as possible: -allergic reactions like skin rash, itching or hives, swelling of the face, lips, or tongue -dizziness -fever -pain, redness, or irritation at site where injected -pinpoint red spots on the skin -shortness of breath or breathing problems -stomach or side pain, or pain at the shoulder -swelling -tiredness -trouble passing urine Side effects that usually do not require medical attention (report to your doctor or health care professional if they continue or are bothersome): -bone pain -muscle pain This list may not describe all possible side effects. Call your doctor for medical advice about side effects. You may report side effects to FDA at 1-800-FDA-1088. Where should I keep my medicine? Keep out of the reach of children. Store pre-filled syringes in a refrigerator between 2 and 8 degrees C (36 and 46 degrees F). Do not freeze. Keep in carton to protect from light. Throw away this medicine if it is left out of the refrigerator for more than 48 hours. Throw away any unused medicine after the expiration date. NOTE: This sheet is a summary. It may not cover all possible  information. If you have questions about this medicine, talk to your doctor, pharmacist, or health care provider.  2015, [employer]. (2014-01-13 16:14:05)

## 2015-01-21 ENCOUNTER — Other Ambulatory Visit: Payer: Self-pay | Admitting: Hematology

## 2015-01-24 ENCOUNTER — Encounter: Payer: Self-pay | Admitting: Hematology

## 2015-01-24 NOTE — Progress Notes (Signed)
I sent forms to [employer] again. She indicated [facility] says having treatment says the patient is incapacitated(#4). I changed the forms and sent back to [employer].

## 2015-01-25 ENCOUNTER — Telehealth: Payer: Self-pay | Admitting: Hematology

## 2015-01-25 ENCOUNTER — Telehealth: Payer: Self-pay | Admitting: *Deleted

## 2015-01-25 ENCOUNTER — Ambulatory Visit: Payer: Medicare Other | Admitting: Nurse Practitioner

## 2015-01-25 ENCOUNTER — Ambulatory Visit: Payer: Medicare Other

## 2015-01-25 ENCOUNTER — Other Ambulatory Visit: Payer: Self-pay | Admitting: *Deleted

## 2015-01-25 ENCOUNTER — Other Ambulatory Visit: Payer: Medicare Other

## 2015-01-25 ENCOUNTER — Other Ambulatory Visit: Payer: Self-pay | Admitting: Nurse Practitioner

## 2015-01-25 NOTE — Telephone Encounter (Signed)
Called pt to see if he can be here within 30 minutes for his appt. Pt said he daughter is at work and his son lives in [place] and he can't get in touch with him. Pt doesn't have a ride to get here today. I told pt we will r/s his appts and tx and someone will call him with the new appt information. Pt verbalized understanding. Message to be forwarded to Gentry Fitz, NP.

## 2015-01-25 NOTE — Telephone Encounter (Signed)
Per staff message and POF I have scheduled appts. Advised scheduler of appts. JMW  

## 2015-01-25 NOTE — Telephone Encounter (Signed)
sw.. pt son and advised on 4.7 appt...ok and aware.Marland KitchenMarland KitchenMarland Kitchen

## 2015-01-25 NOTE — Telephone Encounter (Signed)
Called pt to inquire about today's appt. Pt did not come to appt. No answer. Left a detailed message for pt to call this nurse back @ [phone].

## 2015-02-02 ENCOUNTER — Other Ambulatory Visit (HOSPITAL_BASED_OUTPATIENT_CLINIC_OR_DEPARTMENT_OTHER): Payer: Medicare Other

## 2015-02-02 ENCOUNTER — Encounter: Payer: Self-pay | Admitting: Physician Assistant

## 2015-02-02 ENCOUNTER — Ambulatory Visit (HOSPITAL_BASED_OUTPATIENT_CLINIC_OR_DEPARTMENT_OTHER): Payer: Medicare Other

## 2015-02-02 ENCOUNTER — Ambulatory Visit (HOSPITAL_BASED_OUTPATIENT_CLINIC_OR_DEPARTMENT_OTHER): Payer: Medicare Other | Admitting: Physician Assistant

## 2015-02-02 ENCOUNTER — Telehealth: Payer: Self-pay | Admitting: Hematology

## 2015-02-02 VITALS — BP 140/76 | HR 94 | Temp 98.4°F | Resp 18 | Ht 61.0 in | Wt 140.3 lb

## 2015-02-02 DIAGNOSIS — R5081 Fever presenting with conditions classified elsewhere: Secondary | ICD-10-CM | POA: Diagnosis not present

## 2015-02-02 DIAGNOSIS — Z5111 Encounter for antineoplastic chemotherapy: Secondary | ICD-10-CM | POA: Diagnosis not present

## 2015-02-02 DIAGNOSIS — Z5112 Encounter for antineoplastic immunotherapy: Secondary | ICD-10-CM | POA: Diagnosis present

## 2015-02-02 DIAGNOSIS — D701 Agranulocytosis secondary to cancer chemotherapy: Secondary | ICD-10-CM

## 2015-02-02 DIAGNOSIS — C189 Malignant neoplasm of colon, unspecified: Secondary | ICD-10-CM

## 2015-02-02 DIAGNOSIS — C7970 Secondary malignant neoplasm of unspecified adrenal gland: Secondary | ICD-10-CM

## 2015-02-02 DIAGNOSIS — R21 Rash and other nonspecific skin eruption: Secondary | ICD-10-CM | POA: Diagnosis not present

## 2015-02-02 DIAGNOSIS — C187 Malignant neoplasm of sigmoid colon: Secondary | ICD-10-CM | POA: Diagnosis present

## 2015-02-02 DIAGNOSIS — C787 Secondary malignant neoplasm of liver and intrahepatic bile duct: Secondary | ICD-10-CM

## 2015-02-02 DIAGNOSIS — E119 Type 2 diabetes mellitus without complications: Secondary | ICD-10-CM

## 2015-02-02 DIAGNOSIS — E860 Dehydration: Secondary | ICD-10-CM | POA: Diagnosis not present

## 2015-02-02 DIAGNOSIS — C7800 Secondary malignant neoplasm of unspecified lung: Secondary | ICD-10-CM

## 2015-02-02 LAB — COMPREHENSIVE METABOLIC PANEL (CC13)
ALBUMIN: 3.2 g/dL — AB (ref 3.5–5.0)
ALT: 42 U/L (ref 0–55)
ANION GAP: 8 meq/L (ref 3–11)
AST: 52 U/L — ABNORMAL HIGH (ref 5–34)
Alkaline Phosphatase: 285 U/L — ABNORMAL HIGH (ref 40–150)
BUN: 13.5 mg/dL (ref 7.0–26.0)
CHLORIDE: 104 meq/L (ref 98–109)
CO2: 25 meq/L (ref 22–29)
Calcium: 9.8 mg/dL (ref 8.4–10.4)
Creatinine: 0.8 mg/dL (ref 0.7–1.3)
EGFR: >90 mL/min/{1.73_m2} (ref >90)
GLUCOSE: 140 mg/dL (ref 70–140)
POTASSIUM: 4.5 meq/L (ref 3.5–5.1)
SODIUM: 136 meq/L (ref 136–145)
TOTAL PROTEIN: 8.8 g/dL — AB (ref 6.4–8.3)
Total Bilirubin: 0.78 mg/dL (ref 0.20–1.20)

## 2015-02-02 LAB — CBC WITH DIFFERENTIAL/PLATELET
BASO%: 0.3 % (ref 0.0–2.0)
Basophils Absolute: 0 10*3/uL (ref 0.0–0.1)
EOS%: 1.1 % (ref 0.0–7.0)
Eosinophils Absolute: 0.1 10*3/uL (ref 0.0–0.5)
HCT: 35.2 % — ABNORMAL LOW (ref 38.4–49.9)
HGB: 11.6 g/dL — ABNORMAL LOW (ref 13.0–17.1)
LYMPH%: 13.2 % — ABNORMAL LOW (ref 14.0–49.0)
MCH: 29.2 pg (ref 27.2–33.4)
MCHC: 33 g/dL (ref 32.0–36.0)
MCV: 88.7 fL (ref 79.3–98.0)
MONO#: 1.1 10*3/uL — AB (ref 0.1–0.9)
MONO%: 9.8 % (ref 0.0–14.0)
NEUT#: 8.1 10*3/uL — ABNORMAL HIGH (ref 1.5–6.5)
NEUT%: 75.6 % — ABNORMAL HIGH (ref 39.0–75.0)
Platelets: 184 10*3/uL (ref 140–400)
RBC: 3.97 10*6/uL — AB (ref 4.20–5.82)
RDW: 19.3 % — AB (ref 11.0–14.6)
WBC: 10.7 10*3/uL — AB (ref 4.0–10.3)
lymph#: 1.4 10*3/uL (ref 0.9–3.3)

## 2015-02-02 LAB — CEA: CEA: 1327.9 ng/mL — AB (ref 0.0–5.0)

## 2015-02-02 LAB — CANCER ANTIGEN 19-9: CA 19 9: 1296.5 U/mL — AB (ref <35.0)

## 2015-02-02 MED ORDER — FLUOROURACIL CHEMO INJECTION 2.5 GM/50ML
400.0000 mg/m2 | Freq: Once | INTRAVENOUS | Status: AC
  Administered 2015-02-02: 600 mg via INTRAVENOUS
  Filled 2015-02-02: qty 12

## 2015-02-02 MED ORDER — HEPARIN SOD (PORK) LOCK FLUSH 100 UNIT/ML IV SOLN
500.0000 [IU] | Freq: Once | INTRAVENOUS | Status: DC | PRN
  Filled 2015-02-02: qty 5

## 2015-02-02 MED ORDER — DEXTROSE 5 % IV SOLN
Freq: Once | INTRAVENOUS | Status: AC
  Administered 2015-02-02: 12:00:00 via INTRAVENOUS

## 2015-02-02 MED ORDER — LEUCOVORIN CALCIUM INJECTION 350 MG
398.0000 mg/m2 | Freq: Once | INTRAVENOUS | Status: AC
  Administered 2015-02-02: 620 mg via INTRAVENOUS
  Filled 2015-02-02: qty 31

## 2015-02-02 MED ORDER — DEXTROSE 5 % IV SOLN
85.0000 mg/m2 | Freq: Once | INTRAVENOUS | Status: AC
  Administered 2015-02-02: 135 mg via INTRAVENOUS
  Filled 2015-02-02: qty 27

## 2015-02-02 MED ORDER — SODIUM CHLORIDE 0.9 % IV SOLN
2400.0000 mg/m2 | INTRAVENOUS | Status: DC
  Administered 2015-02-02: 3750 mg via INTRAVENOUS
  Filled 2015-02-02: qty 75

## 2015-02-02 MED ORDER — SODIUM CHLORIDE 0.9 % IV SOLN
3.0000 mg/kg | Freq: Once | INTRAVENOUS | Status: AC
  Administered 2015-02-02: 180 mg via INTRAVENOUS
  Filled 2015-02-02: qty 9

## 2015-02-02 MED ORDER — SODIUM CHLORIDE 0.9 % IJ SOLN
10.0000 mL | INTRAMUSCULAR | Status: DC | PRN
  Filled 2015-02-02: qty 10

## 2015-02-02 MED ORDER — SODIUM CHLORIDE 0.9 % IV SOLN
Freq: Once | INTRAVENOUS | Status: AC
  Administered 2015-02-02: 10:00:00 via INTRAVENOUS

## 2015-02-02 MED ORDER — SODIUM CHLORIDE 0.9 % IV SOLN
Freq: Once | INTRAVENOUS | Status: AC
  Administered 2015-02-02: 10:00:00 via INTRAVENOUS
  Filled 2015-02-02: qty 4

## 2015-02-02 NOTE — Patient Instructions (Signed)
Return in one week for restaging CT scan of your chest, abdomen and pelvis to reevaluate your disease as well as a return visit to reevaluate your skin rash Follow-up in 2 weeks prior to your next scheduled cycle of chemotherapy

## 2015-02-02 NOTE — Telephone Encounter (Signed)
gave and printed appt sched and avs for pt for April....sed added tx. °

## 2015-02-02 NOTE — Patient Instructions (Signed)
[facility] Discharge Instructions for Patients Receiving Chemotherapy  Today you received the following chemotherapy agents: OXaliplatin, Leucovorin, 5FU, Vectibix.  To help prevent nausea and vomiting after your treatment, we encourage you to take your nausea medication: Zofran 8 mg every 8 hours as needed.   If you develop nausea and vomiting that is not controlled by your nausea medication, call the clinic.   BELOW ARE SYMPTOMS THAT SHOULD BE REPORTED IMMEDIATELY:  *FEVER GREATER THAN 100.5 F  *CHILLS WITH OR WITHOUT FEVER  NAUSEA AND VOMITING THAT IS NOT CONTROLLED WITH YOUR NAUSEA MEDICATION  *UNUSUAL SHORTNESS OF BREATH  *UNUSUAL BRUISING OR BLEEDING  TENDERNESS IN MOUTH AND THROAT WITH OR WITHOUT PRESENCE OF ULCERS  *URINARY PROBLEMS  *BOWEL PROBLEMS  UNUSUAL RASH Items with * indicate a potential emergency and should be followed up as soon as possible.  Feel free to call the clinic you have any questions or concerns. The clinic phone number is [phone].  Please show the [facility] at check to the Emergency Department and triage nurse.

## 2015-02-02 NOTE — Progress Notes (Addendum)
[place]  [phone] [phone]  Office Progress Note   Patient Care Team: [name], MD as PCP - General (Specialist) [name], CRNP as Nurse Practitioner (Nurse Practitioner) 02/02/2015  CHIEF COMPLAINTS Metastatic colon cancer    Metastatic colon cancer to liver   10/28/2014 Tumor Marker AFP 3.2 CEA > 10,000 CA 19-9 12,929.6. tumor (-) KRAS and NRAS mutation.    11/10/2014 Imaging PET scan showed hypermetabolic mass in the sigmoid colon was noted metastasis in the retroperitoneum. Probable left adrenal and pulmonary metastasis, and diffuse liver metastasis.   11/21/2014 Initial Diagnosis Metastatic colon cancer to liver, lung, abd nodes and left adrenal gland. Diagnosis was made by liver biopsy.    11/30/2014 -  Chemotherapy First line chemo mFOLFOX6, Panitumumab added from second cycle    CURRENT THERAPY: mFOLFOX6 started on 11/30/2014, Panitumumab added from second cycle on 12/14/14  HISTORY OF PRESENTING ILLNESS:  Jason Reilly 69 y.o. male is here because of abnormal CT findings, which is very suspicious for malignancy. He is on ranitidine from [place], has been on in the [place] for 16 years. He came in with his son and an interpreter.  He has been feeling fatigued since two month ago. He is still able to do all ADLs. He otherwise denies any pain, bloating or nausea.  He lost about 20lbs in 3 month. His appetite is lower than before, eats less, no change of his bowl habits.  She denied any hematochezia or melana. Per his son, he has had some personality changes daily, irritable, slightly confused some time.  He was evaluated by his primary care physician. Lab test reviewed hepatitis B infection, which he did not know before, and elevated alkaline phosphatase, his liver function and the rest of the liver function was not remarkable. [place] of abdomen was obtained on 07/22/2014, which showed diffusely abnormal liver with multiple echogenic lesions. CT of  abdomen with and without contrast was done on 08/26/2014, which reviewed here at a medically with multiple large partially calcified hepatic masses consistent with metastatic disease. Mild retroperitoneal adenopathy with the largest node measuring 1.6 cm. And nonspecific 1.4 cm left adrenal nodule was also noticed. His tumor marker showed CEA greater than 10,000, CA 19-9 12,929, AFP 3.2 (normal). He was referred to [facility] liver clinic and was evaluated by nurse practitioner [name]. Treatment for hepatitis B was not recommended based on his virus load.  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 He was evaluated by a cardiologist in 2014. He denies any significant dyspnea on exertion. No leg swollen.  INTERIM HISTORY: Per returns for follow-up and cycle 4 chemotherapy. He was admitted after cycle #3 for dehydration and neutropenic fever. He was found to have a urinary tract infection and treated with a course of broad spectrum antibiotics and discharged on a course of Cipro. He recovered from that admission. He presents today for cycle #4 and voiced no specific complaints. He is concerned about the length of time his chemotherapy takes.  He continues to have a very good appetite. Denies pain.Marland Kitchen His skin rash is also improved, does not itch. He was found to have hyperglycemia during his hospital stay now on oral diabetic medication.  MEDICAL HISTORY:  Past Medical History  Diagnosis Date  . Tachycardia   . Abnormal EKG   . Hypertension   . Non-ischemic cardiomyopathy   . Hepatitis B     SURGICAL HISTORY: Past Surgical History  Procedure Laterality Date  . Nuclear stress test  03/03/2013    High risk - consistent with nonischemic cardiomyopathy  . Left and right heart catheterization with coronary angiogram N/A 03/29/2013    Procedure: LEFT AND RIGHT HEART CATHETERIZATION WITH CORONARY ANGIOGRAM;  Surgeon: Pixie Casino, MD;   Location: [facility] CATH LAB;  Service: Cardiovascular;  Laterality: N/A;    SOCIAL HISTORY: History   Social History  . Marital Status: Unknown    Spouse Name: N/A  . Number of Children: N/A  . Years of Education: N/A   Occupational History  . Not on file.   Social History Main Topics  . Smoking status: Former Research scientist (life sciences)  . Smokeless tobacco: Never Used  . Alcohol Use: Yes     Comment: occasional  . Drug Use: No  . Sexual Activity: No   Other Topics Concern  . Not on file   Social History Narrative    FAMILY HISTORY: No family history of liver disease or malignancy.  ALLERGIES:  has No Known Allergies.  MEDICATIONS:  Current Outpatient Prescriptions  Medication Sig Dispense Refill  . aspirin EC 81 MG tablet Take 1 tablet (81 mg total) by mouth daily. (Patient not taking: Reported on 11/02/2014) 90 tablet 3  . lisinopril-hydrochlorothiazide (PRINZIDE,ZESTORETIC) 10-12.5 MG per tablet TAKE 1 TABLET BY MOUTH EVERY DAY (Patient not taking: Reported on 11/02/2014) 90 tablet 3  . metoprolol (LOPRESSOR) 50 MG tablet TAKE 1 TABLET BY MOUTH TWICE A DAY (Patient not taking: Reported on 11/02/2014) 180 tablet 3  . metoprolol succinate (TOPROL-XL) 50 MG 24 hr tablet Take 50 mg by mouth 2 (two) times daily. Take with or immediately following a meal.    .        No current facility-administered medications for this visit.    REVIEW OF SYSTEMS:   Constitutional: Denies fevers, chills or abnormal night sweats, (+) fatigue  Eyes: Denies blurriness of vision, double vision or watery eyes Ears, nose, mouth, throat, and face: Denies mucositis or sore throat Respiratory: Denies cough, dyspnea or wheezes Cardiovascular: Denies palpitation, chest discomfort or lower extremity swelling Gastrointestinal: (+) anorexia.   Denies nausea, heartburn or change in bowel habits Skin: Denies abnormal skin rashes Lymphatics: Denies new lymphadenopathy or easy bruising Neurological:Denies numbness, tingling or  new weaknesses Behavioral/Psych: Mood is stable, no new changes, (+) insomnia All other systems were reviewed with the patient and are negative.  PHYSICAL EXAMINATION: BP 140/76 mmHg  Pulse 94  Temp(Src) 98.4 F (36.9 C) (Oral)  Resp 18  Ht 5' 1" (1.549 m)  Wt 140 lb 4.8 oz (63.64 kg)  BMI 26.52 kg/m2  ECOG PERFORMANCE STATUS: 2 Vital sign were taken at in the infusion room, within normal limits. GENERAL:alert, no distress and comfortable SKIN: (+) Dry skin, scatter acne like skin rash es on the face, neck and trunk, minimal, no skin ulcer or discharge. EYES: normal, conjunctiva are pink and non-injected, sclera clear OROPHARYNX:no exudate, no erythema and lips, buccal mucosa, and tongue normal  NECK: supple, thyroid normal size, non-tender, without nodularity LYMPH:  no palpable lymphadenopathy in the cervical, axillary or inguinal LUNGS: clear to auscultation and percussion with normal breathing effort HEART: regular rate & rhythm and no murmurs and no lower extremity edema ABDOMEN:abdomen soft, non-tender, (+) hepatomegaly, liver is palpable 3.5 cm under rib cage, soft nontender, no splenomegaly and normal bowel sounds Musculoskeletal:no cyanosis of digits and no clubbing  PSYCH: alert & oriented x 3 with fluent speech NEURO: no focal motor/sensory deficits  LABORATORY DATA:  I have reviewed the data as listed Lab Results  Component Value Date   WBC 10.7* 02/02/2015   HGB 11.6* 02/02/2015   HCT 35.2* 02/02/2015   MCV 88.7 02/02/2015   PLT 184 02/02/2015    Recent Labs  12/29/14 0530 12/30/14 0600 12/31/14 0850 01/11/15 0813 02/02/15 0836  NA 126* 129* 131* 137 136  K 3.5 3.5 3.5 4.5 4.5  CL 96 96 99  --   --   CO2 _0 30* 25  GLUCOSE 123* 97 199* 98 140  BUN _1 12.1 13.5  CREATININE 0.94 0.96 0.85 0.8 0.8  CALCIUM 7.6* 8.1* 8.0* 9.5 9.8  GFRNONAA 85* 84* 89*  --   --   GFRAA >90 >90 >90  --   --   PROT  --  6.5  --  8.5* 8.8*  ALBUMIN  --  2.4*   --  2.4* 3.2*  AST  --  27  --  47* 52*  ALT  --  16  --  24 42  ALKPHOS  --  175*  --  251* 285*  BILITOT  --  1.1  --  0.46 0.78   AFP 3.2 CEA > 10,000 CA 19-9 12,929.6  Pathology report  Liver, needle/core biopsy - METASTATIC ADENOCARCINOMA, SEE COMMENT. Microscopic Comment The adenocarcinoma demonstrates the following immunophenotype: Cytokeratin 7 - negative expression. Cytokeratin 20 - strong diffuse expression. CD2 - strong diffuse expression. Overall the morphology and immunophenotype are that of metastatic adenocarcinoma primary to colorectum. The recent nuclear medicine scan demonstrating sigmoid mass with associated liver masses is noted.      RADIOGRAPHIC STUDIES: I have personally reviewed the outside CT scan image with patient and his son.   PET 11/10/2014 IMPRESSION: 1. 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 Probable left adrenal and pulmonary metastasis. 2. Widespread hepatic metastasis. Favor secondary to colon primary. The liver does have a somewhat atypical morphology, favored to be related to the extent of metastatic disease. Cirrhosis with synchronous multifocal hepatocellular carcinoma felt less likely.   ASSESSMENT & PLAN:  69 year old [place] male, with past history of hypertension and dilated nonischemic gammopathy with EF 25%, no clinical signs of heart failure, who was found to have hepatitis B infection lately, and multiple liver lesions on the CT scan. He has extremely high CEA and CA 19-9 levels. PET scan reviewed a hypermetabolic sigmoid colon mass, diffuse liver metastasis, probable lung and adrenal gland metastasis.  1. Metastatic sigmoid colon cancer, with diffuse liver, lungs, node and left adrenal gland metastases. KRAS/NRAS wild type, MSI-stable -Liver biopsy showed metastatic adenocarcinoma. His tumor were strongly positive for CK20 and CD2,  consistent with primary colorectal primary. KRAS and NRAS mutations were not detected.  -Pt understands that this is incurable cancer, and he has very high disease burden and overall prognosis is poor. The treatment goal is palliative -His bilirubinemia has improved after the first cycle chemotherapy and remains within the normal range.  -Given the wild-type KRAS/NRAS genotype, he would benefit from EGFR targeted therapy. He started panitumumab from second cycle chemo. He had G2-3 skin rash with neutropenic fever  -He has recovered well, we'll proceed with chemotherapy FOLFOX today  -His skin rash has improved but not completely resolved (grade 1-2), we'll  Restart  panitumumab with 50% dose reduction today  3. Grade 1-2 skin rash  -Secondary to panitumumab, this is been reduced by 50% for  cycle #4 -Hydrocortisone and clindamycin gel topical twice daily, prescription was sent to his pharmacy today -So far no signs of your infection.   4. Neutropenic fever after second cycle chemotherapy -We'll add Neulasta with this cycle chemotherapy.  5. Newly diagnosed diabetes -HbA1c was 8.5 -Continue medication, follow up with primary care physician.  5. HTN, Dilated nonischemic ischemia cardiomyopathy with EF 25% -He is clinically doing well without symptoms of CHF. We will continue to try to avoid cardiotoxic chemotherapy agents and avoid fluid overload during chemotherapy.  -Continue follow-up with cardiology.  6. Hepatitis B carrier -Per liver clinic, no need for treatment. Follow-up with liver clinic.  7. Malnutrition -He is eating well now.We will continue to monitor his nutrition.  Plan -Cycle 4 FOLFOX today with full dose, Neulasta support on day 3 -Restart panitumumab today, at 50% dose reduction  -Patient reviewed with and also seen by [name] -He'll return in one week for reevaluation of his skin rash is well as be scheduled for restaging CT scan of the chest, abdomen and pelvis to  reevaluate his disease -Return to clinic in 2 weeks, for reevaluation prior to next scheduled cycle of chemotherapy.this is  already scheduled with [name].   All questions were answered. The patient knows to call the clinic with any problems, questions or concerns.      Carlton Adam, PA-C 02/02/2015   12:51 PM   Attending addendum: I have seen the patient, examined him. I agree with the assessment and and plan and have edited the notes.  He is clinically doing well, lab adequate for treatment Will proceed chemo and panitumumab today. Due to the severe skin rash, dehydration and neutropenia, I will reduce his panitumumab dose by 50% Close follow up.  Truitt Merle

## 2015-02-03 ENCOUNTER — Telehealth: Payer: Self-pay | Admitting: Hematology

## 2015-02-03 NOTE — Telephone Encounter (Signed)
Late documentation............. On 02/02/15 Added lab/AJ for 4/15 per AJ. Also per AJ patient son requested that ct scan for next week be done same day as visit and this is ok. Took new schedule to patient son and interpreter who translated appointments while in infusion area yesterday. Central will call re ct and son made aware via interpreter. Patients need for an interpreter is documented in EPIC.

## 2015-02-04 ENCOUNTER — Ambulatory Visit (HOSPITAL_BASED_OUTPATIENT_CLINIC_OR_DEPARTMENT_OTHER): Payer: Medicare Other

## 2015-02-04 DIAGNOSIS — C187 Malignant neoplasm of sigmoid colon: Secondary | ICD-10-CM

## 2015-02-04 DIAGNOSIS — Z5189 Encounter for other specified aftercare: Secondary | ICD-10-CM

## 2015-02-04 DIAGNOSIS — C189 Malignant neoplasm of colon, unspecified: Secondary | ICD-10-CM

## 2015-02-04 DIAGNOSIS — C787 Secondary malignant neoplasm of liver and intrahepatic bile duct: Secondary | ICD-10-CM | POA: Diagnosis not present

## 2015-02-04 MED ORDER — SODIUM CHLORIDE 0.9 % IJ SOLN
10.0000 mL | INTRAMUSCULAR | Status: DC | PRN
  Administered 2015-02-04: 10 mL
  Filled 2015-02-04: qty 10

## 2015-02-04 MED ORDER — PEGFILGRASTIM INJECTION 6 MG/0.6ML ~~LOC~~
6.0000 mg | PREFILLED_SYRINGE | Freq: Once | SUBCUTANEOUS | Status: AC
  Administered 2015-02-04: 6 mg via SUBCUTANEOUS

## 2015-02-04 MED ORDER — HEPARIN SOD (PORK) LOCK FLUSH 100 UNIT/ML IV SOLN
500.0000 [IU] | Freq: Once | INTRAVENOUS | Status: AC | PRN
  Administered 2015-02-04: 500 [IU]
  Filled 2015-02-04: qty 5

## 2015-02-04 NOTE — Patient Instructions (Signed)
Pegfilgrastim injection What is this medicine? PEGFILGRASTIM (peg fil GRA stim) is a long-acting granulocyte colony-stimulating factor that stimulates the growth of neutrophils, a type of white blood cell important in the body's fight against infection. It is used to reduce the incidence of fever and infection in patients with certain types of cancer who are receiving chemotherapy that affects the bone marrow. This medicine may be used for other purposes; ask your health care provider or pharmacist if you have questions. COMMON BRAND NAME(S): Neulasta What should I tell my health care provider before I take this medicine? They need to know if you have any of these conditions: -latex allergy -ongoing radiation therapy -sickle cell disease -skin reactions to acrylic adhesives (On-Body Injector only) -an unusual or allergic reaction to pegfilgrastim, filgrastim, other medicines, foods, dyes, or preservatives -pregnant or trying to get pregnant -breast-feeding How should I use this medicine? This medicine is for injection under the skin. If you get this medicine at home, you will be taught how to prepare and give the pre-filled syringe or how to use the On-body Injector. Refer to the patient Instructions for Use for detailed instructions. Use exactly as directed. Take your medicine at regular intervals. Do not take your medicine more often than directed. It is important that you put your used needles and syringes in a special sharps container. Do not put them in a trash can. If you do not have a sharps container, call your pharmacist or healthcare provider to get one. Talk to your pediatrician regarding the use of this medicine in children. Special care may be needed. Overdosage: If you think you have taken too much of this medicine contact a poison control center or emergency room at once. NOTE: This medicine is only for you. Do not share this medicine with others. What if I miss a dose? It is  important not to miss your dose. Call your doctor or health care professional if you miss your dose. If you miss a dose due to an On-body Injector failure or leakage, a new dose should be administered as soon as possible using a single prefilled syringe for manual use. What may interact with this medicine? Interactions have not been studied. Give your health care provider a list of all the medicines, herbs, non-prescription drugs, or dietary supplements you use. Also tell them if you smoke, drink alcohol, or use illegal drugs. Some items may interact with your medicine. This list may not describe all possible interactions. Give your health care provider a list of all the medicines, herbs, non-prescription drugs, or dietary supplements you use. Also tell them if you smoke, drink alcohol, or use illegal drugs. Some items may interact with your medicine. What should I watch for while using this medicine? You may need blood work done while you are taking this medicine. If you are going to need a MRI, CT scan, or other procedure, tell your doctor that you are using this medicine (On-Body Injector only). What side effects may I notice from receiving this medicine? Side effects that you should report to your doctor or health care professional as soon as possible: -allergic reactions like skin rash, itching or hives, swelling of the face, lips, or tongue -dizziness -fever -pain, redness, or irritation at site where injected -pinpoint red spots on the skin -shortness of breath or breathing problems -stomach or side pain, or pain at the shoulder -swelling -tiredness -trouble passing urine Side effects that usually do not require medical attention (report to your doctor   or health care professional if they continue or are bothersome): -bone pain -muscle pain This list may not describe all possible side effects. Call your doctor for medical advice about side effects. You may report side effects to FDA at  1-800-FDA-1088. Where should I keep my medicine? Keep out of the reach of children. Store pre-filled syringes in a refrigerator between 2 and 8 degrees C (36 and 46 degrees F). Do not freeze. Keep in carton to protect from light. Throw away this medicine if it is left out of the refrigerator for more than 48 hours. Throw away any unused medicine after the expiration date. NOTE: This sheet is a summary. It may not cover all possible information. If you have questions about this medicine, talk to your doctor, pharmacist, or health care provider.  2015, Elsevier/Gold Standard. (2014-01-13 16:14:05)  

## 2015-02-07 ENCOUNTER — Other Ambulatory Visit: Payer: Medicare Other

## 2015-02-07 ENCOUNTER — Telehealth: Payer: Self-pay | Admitting: Nurse Practitioner

## 2015-02-07 ENCOUNTER — Telehealth: Payer: Self-pay | Admitting: *Deleted

## 2015-02-07 ENCOUNTER — Encounter: Payer: Medicare Other | Admitting: Nurse Practitioner

## 2015-02-07 DIAGNOSIS — C189 Malignant neoplasm of colon, unspecified: Secondary | ICD-10-CM

## 2015-02-07 DIAGNOSIS — C787 Secondary malignant neoplasm of liver and intrahepatic bile duct: Principal | ICD-10-CM

## 2015-02-07 NOTE — Telephone Encounter (Signed)
TC from daughter-in-law. She states patient had a large dark red bowel movement this am. No recent h/o this. Denies pain, nausea, vomiting. Next appt. Is for Friday, February 10, 2015  Bring pt. In to see Granville Lewis, NP today per [name]. Family notified.

## 2015-02-07 NOTE — Telephone Encounter (Signed)
Patient called asking for a "nurse to tell him why my stools are black.  I'm afraid.  I went to bathroom last night and it was black."  Denies having any further bowel movements.  Denies abdominal pain, nausea but may have a little abdominal swelling.  Reports his son lives in [place], works and unable to come in today.  Mentions appointments on 02-10-2015, 21st and the 23rd.  Reports he will call back when he has another bm to update.  Advised that bleeding is a reason for black stools and to call 911 to go to the ER FOR EVALUATION.  He does not want to go to the ER.

## 2015-02-07 NOTE — Telephone Encounter (Signed)
Pt did not come to our symptom management clinic. I called his son in the late afternoon. Patient had black stool this morning, no red blood in stool, but feels well, denies any dizziness or other new symptoms. Patient has appointment in 2 days which was scheduled before, and his son will bring him in.  Truitt Merle  02/07/2015

## 2015-02-07 NOTE — Telephone Encounter (Signed)
Called pt and left message to call [facility] c/o missed appointment. [name] aware.

## 2015-02-08 ENCOUNTER — Other Ambulatory Visit: Payer: Medicare Other

## 2015-02-08 ENCOUNTER — Encounter (HOSPITAL_COMMUNITY): Payer: Self-pay | Admitting: Emergency Medicine

## 2015-02-08 ENCOUNTER — Inpatient Hospital Stay (HOSPITAL_COMMUNITY)
Admission: EM | Admit: 2015-02-08 | Discharge: 2015-02-12 | DRG: 377 | Disposition: A | Discharge status: Home / Self Care | Payer: Medicare Other | Attending: Internal Medicine | Admitting: Internal Medicine

## 2015-02-08 ENCOUNTER — Inpatient Hospital Stay (HOSPITAL_COMMUNITY): Payer: Medicare Other

## 2015-02-08 ENCOUNTER — Ambulatory Visit: Payer: Medicare Other

## 2015-02-08 ENCOUNTER — Telehealth: Payer: Self-pay

## 2015-02-08 ENCOUNTER — Ambulatory Visit: Payer: Medicare Other | Admitting: Hematology

## 2015-02-08 ENCOUNTER — Ambulatory Visit (HOSPITAL_BASED_OUTPATIENT_CLINIC_OR_DEPARTMENT_OTHER): Payer: Medicare Other | Admitting: Nurse Practitioner

## 2015-02-08 ENCOUNTER — Encounter (HOSPITAL_COMMUNITY)
Admission: EM | Disposition: A | Discharge status: Home / Self Care | Payer: Medicare Other | Source: Home / Self Care | Attending: Internal Medicine

## 2015-02-08 ENCOUNTER — Other Ambulatory Visit (HOSPITAL_BASED_OUTPATIENT_CLINIC_OR_DEPARTMENT_OTHER): Payer: Medicare Other

## 2015-02-08 ENCOUNTER — Other Ambulatory Visit: Payer: Self-pay

## 2015-02-08 VITALS — BP 113/56 | HR 116 | Temp 97.8°F | Resp 20

## 2015-02-08 DIAGNOSIS — Z7982 Long term (current) use of aspirin: Secondary | ICD-10-CM | POA: Diagnosis not present

## 2015-02-08 DIAGNOSIS — C189 Malignant neoplasm of colon, unspecified: Secondary | ICD-10-CM

## 2015-02-08 DIAGNOSIS — C7800 Secondary malignant neoplasm of unspecified lung: Secondary | ICD-10-CM | POA: Diagnosis present

## 2015-02-08 DIAGNOSIS — C7971 Secondary malignant neoplasm of right adrenal gland: Secondary | ICD-10-CM | POA: Diagnosis present

## 2015-02-08 DIAGNOSIS — C187 Malignant neoplasm of sigmoid colon: Secondary | ICD-10-CM

## 2015-02-08 DIAGNOSIS — E860 Dehydration: Secondary | ICD-10-CM

## 2015-02-08 DIAGNOSIS — Z79899 Other long term (current) drug therapy: Secondary | ICD-10-CM | POA: Diagnosis not present

## 2015-02-08 DIAGNOSIS — Z87891 Personal history of nicotine dependence: Secondary | ICD-10-CM | POA: Diagnosis not present

## 2015-02-08 DIAGNOSIS — C787 Secondary malignant neoplasm of liver and intrahepatic bile duct: Secondary | ICD-10-CM | POA: Diagnosis present

## 2015-02-08 DIAGNOSIS — K922 Gastrointestinal hemorrhage, unspecified: Secondary | ICD-10-CM | POA: Diagnosis not present

## 2015-02-08 DIAGNOSIS — R531 Weakness: Secondary | ICD-10-CM

## 2015-02-08 DIAGNOSIS — E875 Hyperkalemia: Secondary | ICD-10-CM

## 2015-02-08 DIAGNOSIS — I10 Essential (primary) hypertension: Secondary | ICD-10-CM | POA: Diagnosis present

## 2015-02-08 DIAGNOSIS — K254 Chronic or unspecified gastric ulcer with hemorrhage: Secondary | ICD-10-CM

## 2015-02-08 DIAGNOSIS — R740 Nonspecific elevation of levels of transaminase and lactic acid dehydrogenase [LDH]: Secondary | ICD-10-CM | POA: Diagnosis present

## 2015-02-08 DIAGNOSIS — R21 Rash and other nonspecific skin eruption: Secondary | ICD-10-CM

## 2015-02-08 DIAGNOSIS — E8809 Other disorders of plasma-protein metabolism, not elsewhere classified: Secondary | ICD-10-CM | POA: Diagnosis not present

## 2015-02-08 DIAGNOSIS — I5042 Chronic combined systolic (congestive) and diastolic (congestive) heart failure: Secondary | ICD-10-CM | POA: Diagnosis present

## 2015-02-08 DIAGNOSIS — D701 Agranulocytosis secondary to cancer chemotherapy: Secondary | ICD-10-CM

## 2015-02-08 DIAGNOSIS — I428 Other cardiomyopathies: Secondary | ICD-10-CM

## 2015-02-08 DIAGNOSIS — C7972 Secondary malignant neoplasm of left adrenal gland: Secondary | ICD-10-CM | POA: Diagnosis present

## 2015-02-08 DIAGNOSIS — I959 Hypotension, unspecified: Secondary | ICD-10-CM | POA: Diagnosis present

## 2015-02-08 DIAGNOSIS — E119 Type 2 diabetes mellitus without complications: Secondary | ICD-10-CM | POA: Diagnosis present

## 2015-02-08 DIAGNOSIS — R112 Nausea with vomiting, unspecified: Secondary | ICD-10-CM

## 2015-02-08 DIAGNOSIS — R739 Hyperglycemia, unspecified: Secondary | ICD-10-CM

## 2015-02-08 DIAGNOSIS — D72829 Elevated white blood cell count, unspecified: Secondary | ICD-10-CM

## 2015-02-08 DIAGNOSIS — C7970 Secondary malignant neoplasm of unspecified adrenal gland: Secondary | ICD-10-CM | POA: Diagnosis not present

## 2015-02-08 DIAGNOSIS — B1910 Unspecified viral hepatitis B without hepatic coma: Secondary | ICD-10-CM | POA: Diagnosis present

## 2015-02-08 DIAGNOSIS — D62 Acute posthemorrhagic anemia: Secondary | ICD-10-CM | POA: Diagnosis present

## 2015-02-08 DIAGNOSIS — A419 Sepsis, unspecified organism: Secondary | ICD-10-CM | POA: Diagnosis present

## 2015-02-08 DIAGNOSIS — D649 Anemia, unspecified: Secondary | ICD-10-CM | POA: Insufficient documentation

## 2015-02-08 DIAGNOSIS — K921 Melena: Secondary | ICD-10-CM

## 2015-02-08 HISTORY — PX: ESOPHAGOGASTRODUODENOSCOPY: SHX5428

## 2015-02-08 LAB — CBC
HCT: 15.2 % — ABNORMAL LOW (ref 39.0–52.0)
HEMOGLOBIN: 5.1 g/dL — AB (ref 13.0–17.0)
MCH: 30 pg (ref 26.0–34.0)
MCHC: 33.6 g/dL (ref 30.0–36.0)
MCV: 89.4 fL (ref 78.0–100.0)
PLATELETS: 195 10*3/uL (ref 150–400)
RBC: 1.7 MIL/uL — ABNORMAL LOW (ref 4.22–5.81)
RDW: 18.8 % — ABNORMAL HIGH (ref 11.5–15.5)
WBC: 65.9 10*3/uL (ref 4.0–10.5)

## 2015-02-08 LAB — ABO/RH: ABO/RH(D): O POS

## 2015-02-08 LAB — COMPREHENSIVE METABOLIC PANEL (CC13)
ALT: 43 U/L (ref 0–55)
AST: 48 U/L — AB (ref 5–34)
Albumin: 2.7 g/dL — ABNORMAL LOW (ref 3.5–5.0)
Alkaline Phosphatase: 417 U/L — ABNORMAL HIGH (ref 40–150)
Anion Gap: 15 mEq/L — ABNORMAL HIGH (ref 3–11)
BUN: 57.7 mg/dL — ABNORMAL HIGH (ref 7.0–26.0)
CO2: 17 meq/L — AB (ref 22–29)
Calcium: 8.8 mg/dL (ref 8.4–10.4)
Chloride: 100 mEq/L (ref 98–109)
Creatinine: 0.9 mg/dL (ref 0.7–1.3)
EGFR: 84 mL/min/{1.73_m2} — ABNORMAL LOW (ref >90)
Glucose: 306 mg/dl — ABNORMAL HIGH (ref 70–140)
POTASSIUM: 6 meq/L — AB (ref 3.5–5.1)
Sodium: 133 mEq/L — ABNORMAL LOW (ref 136–145)
Total Bilirubin: 0.75 mg/dL (ref 0.20–1.20)
Total Protein: 6.2 g/dL — ABNORMAL LOW (ref 6.4–8.3)

## 2015-02-08 LAB — CBC WITH DIFFERENTIAL/PLATELET
BASO%: 0.4 % (ref 0.0–2.0)
Basophils Absolute: 0.3 10*3/uL — ABNORMAL HIGH (ref 0.0–0.1)
EOS%: 0.1 % (ref 0.0–7.0)
Eosinophils Absolute: 0.1 10*3/uL (ref 0.0–0.5)
HEMATOCRIT: 16.9 % — AB (ref 38.4–49.9)
HEMOGLOBIN: 5.1 g/dL — AB (ref 13.0–17.1)
LYMPH%: 5.3 % — ABNORMAL LOW (ref 14.0–49.0)
MCH: 27.2 pg (ref 27.2–33.4)
MCHC: 30.3 g/dL — ABNORMAL LOW (ref 32.0–36.0)
MCV: 89.8 fL (ref 79.3–98.0)
MONO#: 1.8 10*3/uL — AB (ref 0.1–0.9)
MONO%: 2.4 % (ref 0.0–14.0)
NEUT#: 68.1 10*3/uL — ABNORMAL HIGH (ref 1.5–6.5)
NEUT%: 91.8 % — AB (ref 39.0–75.0)
Platelets: 222 10*3/uL (ref 140–400)
RBC: 1.88 10*6/uL — ABNORMAL LOW (ref 4.20–5.82)
RDW: 20.2 % — AB (ref 11.0–14.6)
WBC: 74.1 10*3/uL — AB (ref 4.0–10.3)
lymph#: 3.9 10*3/uL — ABNORMAL HIGH (ref 0.9–3.3)

## 2015-02-08 LAB — MRSA PCR SCREENING: MRSA by PCR: NEGATIVE

## 2015-02-08 LAB — URINALYSIS, ROUTINE W REFLEX MICROSCOPIC
Bilirubin Urine: NEGATIVE
GLUCOSE, UA: 500 mg/dL — AB
HGB URINE DIPSTICK: NEGATIVE
Ketones, ur: NEGATIVE mg/dL
Leukocytes, UA: NEGATIVE
Nitrite: NEGATIVE
PROTEIN: NEGATIVE mg/dL
Specific Gravity, Urine: 1.017 (ref 1.005–1.030)
Urobilinogen, UA: 0.2 mg/dL (ref 0.0–1.0)
pH: 6 (ref 5.0–8.0)

## 2015-02-08 LAB — PROTIME-INR
INR: 1.3 (ref 0.00–1.49)
Prothrombin Time: 16.3 seconds — ABNORMAL HIGH (ref 11.6–15.2)

## 2015-02-08 LAB — IRON AND TIBC
Iron: 61 ug/dL (ref 42–165)
SATURATION RATIOS: 22 % (ref 20–55)
TIBC: 280 ug/dL (ref 215–435)
UIBC: 219 ug/dL (ref 125–400)

## 2015-02-08 LAB — FERRITIN: FERRITIN: 372 ng/mL — AB (ref 22–322)

## 2015-02-08 LAB — GLUCOSE, CAPILLARY
GLUCOSE-CAPILLARY: 229 mg/dL — AB (ref 70–99)
GLUCOSE-CAPILLARY: 153 mg/dL — AB (ref 70–99)

## 2015-02-08 LAB — HEMOGLOBIN AND HEMATOCRIT, BLOOD
HCT: 24.6 % — ABNORMAL LOW (ref 39.0–52.0)
Hemoglobin: 8.5 g/dL — ABNORMAL LOW (ref 13.0–17.0)

## 2015-02-08 LAB — RETICULOCYTES
RBC.: 1.7 MIL/uL — AB (ref 4.22–5.81)
Retic Count, Absolute: 45.9 10*3/uL (ref 19.0–186.0)
Retic Ct Pct: 2.7 % (ref 0.4–3.1)

## 2015-02-08 LAB — CBG MONITORING, ED: Glucose-Capillary: 249 mg/dL — ABNORMAL HIGH (ref 70–99)

## 2015-02-08 LAB — PREPARE RBC (CROSSMATCH)

## 2015-02-08 LAB — VITAMIN B12: VITAMIN B 12: 1684 pg/mL — AB (ref 211–911)

## 2015-02-08 LAB — POC OCCULT BLOOD, ED: FECAL OCCULT BLD: POSITIVE — AB

## 2015-02-08 LAB — FOLATE: Folate: 19 ng/mL

## 2015-02-08 LAB — HOLD TUBE, BLOOD BANK

## 2015-02-08 SURGERY — EGD (ESOPHAGOGASTRODUODENOSCOPY)
Anesthesia: Moderate Sedation

## 2015-02-08 MED ORDER — PANTOPRAZOLE SODIUM 40 MG IV SOLR
8.0000 mg/h | INTRAVENOUS | Status: DC
  Administered 2015-02-08 – 2015-02-11 (×7): 8 mg/h via INTRAVENOUS
  Filled 2015-02-08 (×14): qty 80

## 2015-02-08 MED ORDER — VANCOMYCIN HCL IN DEXTROSE 1-5 GM/200ML-% IV SOLN: 1000.0000 mg | INTRAVENOUS | Status: AC

## 2015-02-08 MED ORDER — ACETAMINOPHEN 325 MG PO TABS: 650.0000 mg | ORAL_TABLET | Freq: Four times a day (QID) | ORAL | Status: DC | PRN

## 2015-02-08 MED ORDER — INSULIN ASPART 100 UNIT/ML ~~LOC~~ SOLN
0.0000 [IU] | Freq: Three times a day (TID) | SUBCUTANEOUS | Status: DC
  Administered 2015-02-08 – 2015-02-09 (×3): 3 [IU] via SUBCUTANEOUS
  Administered 2015-02-10 – 2015-02-12 (×5): 1 [IU] via SUBCUTANEOUS

## 2015-02-08 MED ORDER — ACETAMINOPHEN 650 MG RE SUPP: 650.0000 mg | Freq: Four times a day (QID) | RECTAL | Status: DC | PRN

## 2015-02-08 MED ORDER — SODIUM CHLORIDE 0.9 % IV SOLN: INTRAVENOUS | Status: DC

## 2015-02-08 MED ORDER — SODIUM CHLORIDE 0.9 % IV SOLN
80.0000 mg | Freq: Once | INTRAVENOUS | Status: AC
  Administered 2015-02-08: 80 mg via INTRAVENOUS
  Filled 2015-02-08: qty 80

## 2015-02-08 MED ORDER — MIDAZOLAM HCL 10 MG/2ML IJ SOLN
INTRAMUSCULAR | Status: AC
  Filled 2015-02-08: qty 4

## 2015-02-08 MED ORDER — PIPERACILLIN-TAZOBACTAM 3.375 G IVPB 30 MIN
3.3750 g | INTRAVENOUS | Status: AC
  Administered 2015-02-08: 3.375 g via INTRAVENOUS
  Filled 2015-02-08: qty 50

## 2015-02-08 MED ORDER — SODIUM CHLORIDE 0.9 % IV SOLN
INTRAVENOUS | Status: DC
  Administered 2015-02-08 – 2015-02-09 (×2): via INTRAVENOUS

## 2015-02-08 MED ORDER — FENTANYL CITRATE 0.05 MG/ML IJ SOLN
INTRAMUSCULAR | Status: DC | PRN
  Administered 2015-02-08: 25 ug via INTRAVENOUS

## 2015-02-08 MED ORDER — SODIUM CHLORIDE 0.9 % IJ SOLN
INTRAMUSCULAR | Status: AC
  Administered 2015-02-08: 12:00:00
  Filled 2015-02-08: qty 10

## 2015-02-08 MED ORDER — ONDANSETRON HCL 4 MG/2ML IJ SOLN: 4.0000 mg | Freq: Four times a day (QID) | INTRAMUSCULAR | Status: DC | PRN

## 2015-02-08 MED ORDER — SODIUM CHLORIDE 0.9 % IV SOLN
10.0000 mL/h | Freq: Once | INTRAVENOUS | Status: AC
  Administered 2015-02-08: 10 mL/h via INTRAVENOUS

## 2015-02-08 MED ORDER — SODIUM CHLORIDE 0.9 % IV BOLUS (SEPSIS)
1000.0000 mL | Freq: Once | INTRAVENOUS | Status: AC
  Administered 2015-02-08: 1000 mL via INTRAVENOUS

## 2015-02-08 MED ORDER — EPINEPHRINE HCL 0.1 MG/ML IJ SOSY
PREFILLED_SYRINGE | INTRAMUSCULAR | Status: AC
  Filled 2015-02-08: qty 10

## 2015-02-08 MED ORDER — PIPERACILLIN-TAZOBACTAM 3.375 G IVPB 30 MIN: 3.3750 g | Freq: Three times a day (TID) | INTRAVENOUS | Status: DC

## 2015-02-08 MED ORDER — PANTOPRAZOLE SODIUM 40 MG IV SOLR: 40.0000 mg | Freq: Two times a day (BID) | INTRAVENOUS | Status: DC

## 2015-02-08 MED ORDER — MIDAZOLAM HCL 10 MG/2ML IJ SOLN
INTRAMUSCULAR | Status: DC | PRN
  Administered 2015-02-08 (×2): 2 mg via INTRAVENOUS

## 2015-02-08 MED ORDER — BUTAMBEN-TETRACAINE-BENZOCAINE 2-2-14 % EX AERO
INHALATION_SPRAY | CUTANEOUS | Status: DC | PRN
  Administered 2015-02-08: 1 via TOPICAL

## 2015-02-08 MED ORDER — ONDANSETRON HCL 4 MG PO TABS: 4.0000 mg | ORAL_TABLET | Freq: Four times a day (QID) | ORAL | Status: DC | PRN

## 2015-02-08 MED ORDER — SODIUM CHLORIDE 0.9 % IJ SOLN
PREFILLED_SYRINGE | INTRAMUSCULAR | Status: DC | PRN
  Administered 2015-02-08: 4 mL

## 2015-02-08 MED ORDER — ACETONE (URINE) TEST VI STRP
1.0000 | ORAL_STRIP | Status: DC | PRN
  Filled 2015-02-08: qty 1

## 2015-02-08 MED ORDER — PIPERACILLIN-TAZOBACTAM 3.375 G IVPB
3.3750 g | Freq: Three times a day (TID) | INTRAVENOUS | Status: DC
  Administered 2015-02-08 – 2015-02-11 (×8): 3.375 g via INTRAVENOUS
  Filled 2015-02-08 (×4): qty 50

## 2015-02-08 MED ORDER — FENTANYL CITRATE 0.05 MG/ML IJ SOLN
INTRAMUSCULAR | Status: AC
  Filled 2015-02-08: qty 4

## 2015-02-08 MED ORDER — DIPHENHYDRAMINE HCL 50 MG/ML IJ SOLN
INTRAMUSCULAR | Status: AC
  Filled 2015-02-08: qty 1

## 2015-02-08 MED ORDER — VANCOMYCIN HCL IN DEXTROSE 750-5 MG/150ML-% IV SOLN
750.0000 mg | Freq: Two times a day (BID) | INTRAVENOUS | Status: DC
  Administered 2015-02-09 – 2015-02-11 (×5): 750 mg via INTRAVENOUS
  Filled 2015-02-08 (×7): qty 150

## 2015-02-08 MED ORDER — SODIUM CHLORIDE 0.9 % IJ SOLN
3.0000 mL | Freq: Two times a day (BID) | INTRAMUSCULAR | Status: DC
  Administered 2015-02-08 – 2015-02-11 (×6): 3 mL via INTRAVENOUS

## 2015-02-08 NOTE — ED Notes (Addendum)
Skin warm and dry. No rashes present. Pt denies pain at this time. His urine is yellow with an output of 250. He is alert and oriented. Lung sounds clear.

## 2015-02-08 NOTE — Op Note (Signed)
[facility] [place], [address]   ENDOSCOPY PROCEDURE REPORT  PATIENT: Jason Reilly, Jason Reilly  MR#: #[id number] BIRTHDATE: 1946-05-30 , 26  yrs. old GENDER: male ENDOSCOPIST: Ladene Artist, MD, [facility] REFERRED BY:  [facility] PROCEDURE DATE:  02/08/2015 PROCEDURE:  EGD w/ control of bleeding ASA CLASS:     Class III INDICATIONS:  melena. MEDICATIONS: Fentanyl 25 mcg IV and Versed 4 mg IV TOPICAL ANESTHETIC: Cetacaine Spray DESCRIPTION OF PROCEDURE: After the risks benefits and alternatives of the procedure were thoroughly explained, informed consent was obtained.  The Pentax Gastroscope [id number] endoscope was introduced through the mouth and advanced to the second portion of the duodenum , Without limitations.  The instrument was slowly withdrawn as the mucosa was fully examined.    STOMACH: A single bleeding and round ulcer measuring 10 x 36mm in size with an adherent clot was found at the incisura.  Submucosal injection of 78ml of epinephrine 1:10,000 was performed around the bleeding site. Multiple washes before and after epi inj could not dislodge the clot.  The stomach otherwise appeared normal. ESOPHAGUS: The mucosa of the esophagus appeared normal. DUODENUM: The duodenal mucosa showed no abnormalities in the bulb and 2nd part of the duodenum.  Retroflexed views revealed no abnormalities.    The scope was then withdrawn from the patient and the procedure completed.  COMPLICATIONS: There were no immediate complications.  ENDOSCOPIC IMPRESSION: 1.   Single ulcer at the incisura; injection of epinephrine 1:10,000 performed 2.   The EGD otherwise appeared normal  RECOMMENDATIONS: 1.  PPI gtt x 72 hrs, then PPI bid for 8 weeks, then PPI qam long term 2.  Avoid ASA/NSAIDS long term 3.  Observe closely in ICU for rebleeding  eSigned:  Ladene Artist, MD, [facility] 02/08/2015 5:24 PM   [C

## 2015-02-08 NOTE — ED Provider Notes (Addendum)
CSN: [id number]     Arrival date & time 02/08/15  1039 History   First MD Initiated Contact with Patient 02/08/15 1054     Chief Complaint  Patient presents with  . Anemia  . dark stools       HPI Patient has a history of metastatic colon cancer.  He has multiple metastases in the liver.  He was sent to [place] from the [facility] today with melena as well as nausea and vomiting and a drop in his hemoglobin from 11 to 5.1 over the past 6 days.  Patient has tachycardia and hypotension.  He denies hematemesis.  He denies coffee ground emesis.  No history of upper GI bleed.he reports nausea and vomiting for 3 days with dark stools for 2 days.  He is not on any anticoagulants.  His symptoms are mild to moderate in severity.  He feels weak   Past Medical History  Diagnosis Date  . Tachycardia   . Abnormal EKG   . Hypertension   . Non-ischemic cardiomyopathy   . Hepatitis B    Past Surgical History  Procedure Laterality Date  . Nuclear stress test  03/03/2013    High risk - consistent with nonischemic cardiomyopathy  . Left and right heart catheterization with coronary angiogram N/A 03/29/2013    Procedure: LEFT AND RIGHT HEART CATHETERIZATION WITH CORONARY ANGIOGRAM;  Surgeon: Pixie Casino, MD;  Location: [facility] C[facility] LAB;  Service: Cardiovascular;  Laterality: N/A;   No family history on file. History  Substance Use Topics  . Smoking status: Former Research scientist (life sciences)  . Smokeless tobacco: Never Used  . Alcohol Use: Yes     Comment: occasional    Review of Systems  All other systems reviewed and are negative.     Allergies  Review of patient's allergies indicates no known allergies.  Home Medications   Prior to Admission medications   Medication Sig Start Date End Date Taking? Authorizing Provider  aspirin EC 81 MG tablet Take 1 tablet (81 mg total) by mouth daily. 03/09/13   Pixie Casino, MD  clindamycin (CLINDAGEL) 1 % gel Apply topically 2 (two) times daily. 01/11/15   Truitt Merle, MD   HYDROcodone-acetaminophen (NORCO/VICODIN) 5-325 MG per tablet Take 1-2 tablets by mouth every 4 (four) hours as needed for moderate pain. 12/31/14   Theodis Blaze, MD  hydrocortisone 2.5 % cream Apply topically 2 (two) times daily. Once daily on face and twice daily on body 01/11/15   Truitt Merle, MD  lidocaine-prilocaine (EMLA) cream Apply 1 application topically as needed. Apply to [facility] cath at least one hour before needle stick. 11/30/14   Truitt Merle, MD  linagliptin (TRADJENTA) 5 MG TABS tablet Take 1 tablet (5 mg total) by mouth daily. 01/02/15   Theodis Blaze, MD  lisinopril-hydrochlorothiazide (PRINZIDE,ZESTORETIC) 10-12.5 MG per tablet Take 1 tablet by mouth daily. 12/02/14   Historical Provider, MD  loperamide (IMODIUM A-D) 2 MG tablet Take 2 mg by mouth 4 (four) times daily as needed for diarrhea or loose stools.    Historical Provider, MD  metFORMIN (GLUCOPHAGE) 500 MG tablet Take by mouth 2 (two) times daily with a meal.    Historical Provider, MD  metoprolol-hydrochlorothiazide (LOPRESSOR HCT) 100-25 MG per tablet Take 1 tablet by mouth daily. 12/02/14   Historical Provider, MD  mirtazapine (REMERON) 15 MG tablet Take 1 tablet (15 mg total) by mouth at bedtime. 11/02/14   Truitt Merle, MD  ondansetron (ZOFRAN) 8 MG tablet Take 1  tablet (8 mg total) by mouth every 8 (eight) hours as needed for nausea or vomiting. 12/31/14   Theodis Blaze, MD   BP 98/48 mmHg  Pulse 116  Temp(Src) 98.1 F (36.7 C) (Oral)  Resp 20  SpO2 93% Physical Exam  Constitutional: He is oriented to person, place, and time. He appears well-developed and well-nourished.  Appears pale.  HENT:  Head: Normocephalic and atraumatic.  Eyes: EOM are normal.  Neck: Normal range of motion.  Cardiovascular: Normal rate, regular rhythm, normal heart sounds and intact distal pulses.   Pulmonary/Chest: Effort normal and breath sounds normal. No respiratory distress.  Abdominal: Soft. He exhibits no distension. There is no tenderness.   Genitourinary:  Melena.  No gross blood.  Musculoskeletal: Normal range of motion.  Neurological: He is alert and oriented to person, place, and time.  Skin: Skin is warm and dry.  Psychiatric: He has a normal mood and affect. Judgment normal.  Nursing note and vitals reviewed.   ED Course  Procedures (including critical care time)  CRITICAL CARE Performed by: Hoy Morn Total critical care time: 32 Critical care time was exclusive of separately billable procedures and treating other patients. Critical care was necessary to treat or prevent imminent or life-threatening deterioration. Critical care was time spent personally by me on the following activities: development of treatment plan with patient and/or surrogate as well as nursing, discussions with consultants, evaluation of patient's response to treatment, examination of patient, obtaining history from patient or surrogate, ordering and performing treatments and interventions, ordering and review of laboratory studies, ordering and review of radiographic studies, pulse oximetry and re-evaluation of patient's condition.   Labs Review Labs Reviewed  POC OCCULT BLOOD, ED - Abnormal; Notable for the following:    Fecal Occult Bld POSITIVE (*)    All other components within normal limits  VITAMIN B12  FOLATE  IRON AND TIBC  FERRITIN  RETICULOCYTES  CBC  PROTIME-INR  PREPARE RBC (CROSSMATCH)    Imaging Review No results found.   EKG Interpretation   Date/Time:  Wednesday February 08 2015 11:14:54 EDT Ventricular Rate:  114 PR Interval:  149 QRS Duration: 96 QT Interval:  352 QTC Calculation: 485 R Axis:   14 Text Interpretation:  Sinus tachycardia Borderline T abnormalities,  lateral leads Borderline prolonged QT interval No old tracing to compare  Confirmed by Floris Neuhaus  MD, Lennette Bihari ([id number]) on 02/08/2015 11:47:39 AM      MDM   Final diagnoses:  None    Patient with likely upper GI bleed given nausea vomiting  and melanotic examination.  Patient be initiated on Protonix.  He is not on anticoagulants but he does have multiple liver metastases.  Coag studies will be ordered.  Patient will be transfused blood at this time given his mild hypotension and tachycardia.  Anemia panel ordered.  Suspect acute GI blood loss as the cause.we'll discuss the case with gastroenterology and admit to the step down unit.  2 IVs placed.    Jola Schmidt, MD 02/08/15 [place], MD 02/08/15 [phone]

## 2015-02-08 NOTE — ED Notes (Signed)
I got the first set of cultures however was unable to get second set.

## 2015-02-08 NOTE — ED Notes (Signed)
Pt resting comfortably

## 2015-02-08 NOTE — ED Notes (Signed)
Blood verified by Acey Lav RN.

## 2015-02-08 NOTE — H&P (View-Only) (Signed)
[facility] Consult: 1:11 PM 02/08/2015  LOS: 0 days    Referring Provider: Dr Carles Collet Primary Care Physician:  [name], MD Primary Gastroenterologist:  unassigned   Hx obtained interviewing pt with his dtr present, she was the translator.   Reason for Consultation: GI bleed with dark FOBT+ stool, anemia and weakness.     HPI: Jason Reilly is a Montagnard 69 y.o. male.  Type 2 DM, Hep B + [facility] clinician says no need to treat based on viral load), non ischemic CM (25% EF), with hx colon cancer.  Colon cancer diagnosed based on abnormal CT and ultrasound (mets in liver, RP adenopathy, CEA > 1,000 in 10/2014) and subsequent liver biopsy, did not undergo colonoscopy.   Receiving chemo every 2 weeks at cancer center.  Last chemo session was Thursday 4/7.  Had his usual post chemo N/V since then, never bloody or CG, up to 6 or so episodes per day.  Takes 81 ASA.  Black stool noted yesterday and again today.  Very weak today but no presyncope or falls.  Hgb today 5.1, was 10.5 on 3/16, 11.6 on 4/7. BUN up to 57 from 13.5 on 4/7. In ED SBP as low as 80s and tachy to ~115.  Received one liter IVF and starting on first of 3 PRBCs.  Started on PPI bolus and gtt.  No previous hx UGIB.      Past Medical History  Diagnosis Date  . Tachycardia   . Abnormal EKG   . Hypertension   . Non-ischemic cardiomyopathy   . Hepatitis B     Past Surgical History  Procedure Laterality Date  . Nuclear stress test  03/03/2013    High risk - consistent with nonischemic cardiomyopathy  . Left and right heart catheterization with coronary angiogram N/A 03/29/2013    Procedure: LEFT AND RIGHT HEART CATHETERIZATION WITH CORONARY ANGIOGRAM;  Surgeon: Pixie Casino, MD;  Location: [facility] CATH LAB;  Service: Cardiovascular;  Laterality: N/A;     Prior to Admission medications   Medication Sig Start Date End Date Taking? Authorizing Provider  aspirin EC 81 MG tablet Take 1 tablet (81 mg total) by mouth daily. 03/09/13  Yes Pixie Casino, MD  clindamycin (CLINDAGEL) 1 % gel Apply topically 2 (two) times daily. 01/11/15  Yes Truitt Merle, MD  hydrocortisone 2.5 % cream Apply topically 2 (two) times daily. Once daily on face and twice daily on body 01/11/15  Yes Truitt Merle, MD  lidocaine-prilocaine (EMLA) cream Apply 1 application topically as needed. Apply to [facility] cath at least one hour before needle stick. 11/30/14  Yes Truitt Merle, MD  linagliptin (TRADJENTA) 5 MG TABS tablet Take 1 tablet (5 mg total) by mouth daily. 01/02/15  Yes Theodis Blaze, MD  lisinopril-hydrochlorothiazide (PRINZIDE,ZESTORETIC) 10-12.5 MG per tablet Take 1 tablet by mouth daily. 12/02/14  Yes Historical Provider, MD  loperamide (IMODIUM A-D) 2 MG tablet Take 2 mg by mouth 4 (four) times daily as needed for diarrhea or loose stools.   Yes Historical Provider, MD  metFORMIN (GLUCOPHAGE)  500 MG tablet Take by mouth 2 (two) times daily with a meal.   Yes Historical Provider, MD  mirtazapine (REMERON) 15 MG tablet Take 1 tablet (15 mg total) by mouth at bedtime. 11/02/14  Yes Truitt Merle, MD  ondansetron (ZOFRAN) 8 MG tablet Take 1 tablet (8 mg total) by mouth every 8 (eight) hours as needed for nausea or vomiting. 12/31/14  Yes Theodis Blaze, MD    Scheduled Meds: . [START ON 02/11/2015] pantoprazole (PROTONIX) IV  40 mg Intravenous Q12H   Infusions: . pantoprozole (PROTONIX) infusion 8 mg/hr (02/08/15 1232)   PRN Meds:    Allergies as of 02/08/2015  . (No Known Allergies)    No family history on file.  History   Social History  . Marital Status: Unknown    Spouse Name: N/A  . Number of Children: N/A  . Years of Education: N/A   Occupational History  . Not on file.   Social History Main Topics  . Smoking status: Former Research scientist (life sciences)  . Smokeless tobacco: Never Used  .  Alcohol Use: Yes     Comment: occasional  . Drug Use: No  . Sexual Activity: No   Other Topics Concern  . Not on file   Social History Narrative    REVIEW OF SYSTEMS: Constitutional:  Per HPI.  Weight is coming up from 124 to 140 in last several weeks ENT:  No nose bleeds Pulm:  No sob or cough CV:  No palpitations, no LE edema.  GU:  No hematuria, no frequency GI:  No dysphagia, other than post chemo nausea, his appetite is good Heme:  No excessive bleeding or bruising    Transfusions:  Never before today Neuro:  No headaches, no peripheral tingling or numbness Derm:  No itching, no rash or sores.  Endocrine:  No sweats or chills.  No polyuria or dysuria Immunization:  Flu vaccine 07/2014 Travel:  None beyond local counties in last few months.    PHYSICAL EXAM: Vital signs in last 24 hours: Filed Vitals:   02/08/15 1300  BP: 116/52  Pulse: 114  Temp:   Resp: 22   Wt Readings from Last 3 Encounters:  02/02/15 140 lb 4.8 oz (63.64 kg)  01/11/15 130 lb 12.8 oz (59.33 kg)  01/05/15 124 lb (56.246 kg)   General: pleasant, alert, looks old for age and somewhat ill.  Head:  No asymmetry or signs trauma  Eyes:  No icterus or conj pallor Ears:  Not HOH  Nose:  No discharge or congestion Mouth:  Clear bil.  Teeth fair.  MM moist Neck:  No mass or JVD Lungs:  Clear bil.  No cough or dyspnea.  Porta cath on upper right chest Heart: RRR.  No MRG.   Abdomen:  Soft, NT, ND, active BS, no mass or HSM.  Marland Kitchen   Rectal: deferred.  FOBT + on sample today.  Musc/Skeltl: no joint deformity, redness Extremities:  No CCE.  Warm feet  Neurologic:  Oriented x3.  No limb weakness, no tremor Skin:  No rash, sore or telangectasia Nodes:  No cervical adenopathy   Psych:  Pleasant, relaxed, cooperative  Intake/Output from previous day:   Intake/Output this shift: Total I/O In: 50 [Blood:50] Out: -   LAB RESULTS:  Recent Labs  02/08/15 0922 02/08/15 1142  WBC 74.1* 65.9*  HGB  5.1* 5.1*  HCT 16.9* 15.2*  PLT 222 195   BMET Lab Results  Component Value Date   NA 133* 02/08/2015  NA 136 02/02/2015   NA 137 01/11/2015   K 6.0* 02/08/2015   K 4.5 02/02/2015   K 4.5 01/11/2015   CL 99 12/31/2014   CL 96 12/30/2014   CL 96 12/29/2014   CO2 17* 02/08/2015   CO2 25 02/02/2015   CO2 30* 01/11/2015   GLUCOSE 306* 02/08/2015   GLUCOSE 140 02/02/2015   GLUCOSE 98 01/11/2015   BUN 57.7* 02/08/2015   BUN 13.5 02/02/2015   BUN 12.1 01/11/2015   CREATININE 0.9 02/08/2015   CREATININE 0.8 02/02/2015   CREATININE 0.8 01/11/2015   CALCIUM 8.8 02/08/2015   CALCIUM 9.8 02/02/2015   CALCIUM 9.5 01/11/2015   LFT  Recent Labs  02/08/15 0922  PROT 6.2*  ALBUMIN 2.7*  AST 48*  ALT 43  ALKPHOS 417*  BILITOT 0.75   PT/INR Lab Results  Component Value Date   INR 1.30 02/08/2015   INR 1.28 12/28/2014   INR 1.08 11/21/2014    RADIOLOGY STUDIES: No results found.  ENDOSCOPIC STUDIES: None in Epic.    IMPRESSION:   *  Metastatic colon cancer.  Undergoing Chemo  *  UGIB assumed with black and FOBT + stools, elevated BUN. R/o PUD, r/o MWT given post chemo emesis (but no hematemesis)  *  ABL anemia.    *  Non-ischemic CM   PLAN:     *  Set up for EGD late this afternoon, d/w pt and his daughter translated.    Azucena Freed  02/08/2015, 1:11 PM Pager: [phone]      Attending physician's note   I have taken a history, examined the patient and reviewed the chart. I agree with the Advanced Practitioner's note, impression and recommendations. Acute GI bleed, likely upper source, in patient with metastatic colon cancer undergoing chemotherapy. ABL anemia with Hb=5.1. WBC=74.1k. Transfusions to keep Hb > 8. IV PPI infusion. EGD urgently today.   Ladene Artist, MD [facility]

## 2015-02-08 NOTE — ED Notes (Signed)
Report given to [facility] in ICU

## 2015-02-08 NOTE — Telephone Encounter (Signed)
Pt came in today with daughter b/c of black stool yesterday and he has been vomiting today. Set up for lab and [facility]

## 2015-02-08 NOTE — ED Notes (Signed)
Pt transported to XRAY °

## 2015-02-08 NOTE — Progress Notes (Signed)
ANTIBIOTIC CONSULT NOTE - INITIAL  Pharmacy Consult for Vancomycin  Indication: Sepsis  No Known Allergies  Patient Measurements:   Adjusted Body Weight:   Vital Signs: Temp: 97.8 F (36.6 C) (04/13 1331) Temp Source: Oral (04/13 1331) BP: 113/54 mmHg (04/13 1345) Pulse Rate: 116 (04/13 1345) Intake/Output from previous day:   Intake/Output from this shift: Total I/O In: 50 [Blood:50] Out: -   Labs:  Recent Labs  02/08/15 0922 02/08/15 1142  WBC 74.1* 65.9*  HGB 5.1* 5.1*  PLT 222 195  CREATININE 0.9  --    Estimated Creatinine Clearance: 64.9 mL/min (by C-G formula based on Cr of 0.9). No results for input(s): VANCOTROUGH, VANCOPEAK, VANCORANDOM, GENTTROUGH, GENTPEAK, GENTRANDOM, TOBRATROUGH, TOBRAPEAK, TOBRARND, AMIKACINPEAK, AMIKACINTROU, AMIKACIN in the last 72 hours.   Microbiology: No results found for this or any previous visit (from the past 720 hour(s)).  Medical History: Past Medical History  Diagnosis Date  . Tachycardia   . Abnormal EKG   . Hypertension   . Non-ischemic cardiomyopathy   . Hepatitis B    Assessment: 51 yoM with hx metastatic colon cancer undergoing FOLFOX chemotherapy, DM, NICM, HTN presents with generalized weakness and 2-3 day history of N/V and melanotic stools.  FOB+ and hgb 5.1. Pt with mild hypotension.  Pharmacy consulted to start Vancomycin for sepsis.  Zosyn already initiated in ED.  NKDA.   4/13 >> Zosyn  >> 4/13 >> Vanc  >>    4/13 blood x2: Ordered 4/13 urine: Ordered   Afebrile Renal: SCr 0.9, CrCl ~65 ml/min WBC: Elevated 66K, ? 2/2 recent neulasta inj  Goal of Therapy:  Vancomycin trough level 15-20 mcg/ml  Plan:  Vancomycin 1g IV x 1 now, then 750mg  IV q12h  Ralene Bathe, PharmD, BCPS 02/08/2015, 2:01 PM  Pager: [phone]

## 2015-02-08 NOTE — ED Notes (Addendum)
[facility] called to report there will be a delay in accepting pt to ICU due to staffing and influx of patients. Will continue to monitor situation.

## 2015-02-08 NOTE — ED Notes (Signed)
No suspected reaction to blood. Pt denies pain. No fever present (97.7). He is alert and oriented.

## 2015-02-08 NOTE — Consult Note (Signed)
[facility] Consult: 1:11 PM 02/08/2015  LOS: 0 days    Referring Provider: Dr Carles Collet Primary Care Physician:  [name], MD Primary Gastroenterologist:  unassigned   Hx obtained interviewing pt with his dtr present, she was the translator.   Reason for Consultation: GI bleed with dark FOBT+ stool, anemia and weakness.     HPI: Jason Reilly is a Montagnard 69 y.o. male.  Type 2 DM, Hep B + [facility] clinician says no need to treat based on viral load), non ischemic CM (25% EF), with hx colon cancer.  Colon cancer diagnosed based on abnormal CT and ultrasound (mets in liver, RP adenopathy, CEA > 1,000 in 10/2014) and subsequent liver biopsy, did not undergo colonoscopy.   Receiving chemo every 2 weeks at cancer center.  Last chemo session was Thursday 4/7.  Had his usual post chemo N/V since then, never bloody or CG, up to 6 or so episodes per day.  Takes 81 ASA.  Black stool noted yesterday and again today.  Very weak today but no presyncope or falls.  Hgb today 5.1, was 10.5 on 3/16, 11.6 on 4/7. BUN up to 57 from 13.5 on 4/7. In ED SBP as low as 80s and tachy to ~115.  Received one liter IVF and starting on first of 3 PRBCs.  Started on PPI bolus and gtt.  No previous hx UGIB.      Past Medical History  Diagnosis Date  . Tachycardia   . Abnormal EKG   . Hypertension   . Non-ischemic cardiomyopathy   . Hepatitis B     Past Surgical History  Procedure Laterality Date  . Nuclear stress test  03/03/2013    High risk - consistent with nonischemic cardiomyopathy  . Left and right heart catheterization with coronary angiogram N/A 03/29/2013    Procedure: LEFT AND RIGHT HEART CATHETERIZATION WITH CORONARY ANGIOGRAM;  Surgeon: Pixie Casino, MD;  Location: [facility] CATH LAB;  Service: Cardiovascular;  Laterality: N/A;     Prior to Admission medications   Medication Sig Start Date End Date Taking? Authorizing Provider  aspirin EC 81 MG tablet Take 1 tablet (81 mg total) by mouth daily. 03/09/13  Yes Pixie Casino, MD  clindamycin (CLINDAGEL) 1 % gel Apply topically 2 (two) times daily. 01/11/15  Yes Truitt Merle, MD  hydrocortisone 2.5 % cream Apply topically 2 (two) times daily. Once daily on face and twice daily on body 01/11/15  Yes Truitt Merle, MD  lidocaine-prilocaine (EMLA) cream Apply 1 application topically as needed. Apply to [facility] cath at least one hour before needle stick. 11/30/14  Yes Truitt Merle, MD  linagliptin (TRADJENTA) 5 MG TABS tablet Take 1 tablet (5 mg total) by mouth daily. 01/02/15  Yes Theodis Blaze, MD  lisinopril-hydrochlorothiazide (PRINZIDE,ZESTORETIC) 10-12.5 MG per tablet Take 1 tablet by mouth daily. 12/02/14  Yes Historical Provider, MD  loperamide (IMODIUM A-D) 2 MG tablet Take 2 mg by mouth 4 (four) times daily as needed for diarrhea or loose stools.   Yes Historical Provider, MD  metFORMIN (GLUCOPHAGE)  500 MG tablet Take by mouth 2 (two) times daily with a meal.   Yes Historical Provider, MD  mirtazapine (REMERON) 15 MG tablet Take 1 tablet (15 mg total) by mouth at bedtime. 11/02/14  Yes Truitt Merle, MD  ondansetron (ZOFRAN) 8 MG tablet Take 1 tablet (8 mg total) by mouth every 8 (eight) hours as needed for nausea or vomiting. 12/31/14  Yes Theodis Blaze, MD    Scheduled Meds: . [START ON 02/11/2015] pantoprazole (PROTONIX) IV  40 mg Intravenous Q12H   Infusions: . pantoprozole (PROTONIX) infusion 8 mg/hr (02/08/15 1232)   PRN Meds:    Allergies as of 02/08/2015  . (No Known Allergies)    No family history on file.  History   Social History  . Marital Status: Unknown    Spouse Name: N/A  . Number of Children: N/A  . Years of Education: N/A   Occupational History  . Not on file.   Social History Main Topics  . Smoking status: Former Research scientist (life sciences)  . Smokeless tobacco: Never Used  .  Alcohol Use: Yes     Comment: occasional  . Drug Use: No  . Sexual Activity: No   Other Topics Concern  . Not on file   Social History Narrative    REVIEW OF SYSTEMS: Constitutional:  Per HPI.  Weight is coming up from 124 to 140 in last several weeks ENT:  No nose bleeds Pulm:  No sob or cough CV:  No palpitations, no LE edema.  GU:  No hematuria, no frequency GI:  No dysphagia, other than post chemo nausea, his appetite is good Heme:  No excessive bleeding or bruising    Transfusions:  Never before today Neuro:  No headaches, no peripheral tingling or numbness Derm:  No itching, no rash or sores.  Endocrine:  No sweats or chills.  No polyuria or dysuria Immunization:  Flu vaccine 07/2014 Travel:  None beyond local counties in last few months.    PHYSICAL EXAM: Vital signs in last 24 hours: Filed Vitals:   02/08/15 1300  BP: 116/52  Pulse: 114  Temp:   Resp: 22   Wt Readings from Last 3 Encounters:  02/02/15 140 lb 4.8 oz (63.64 kg)  01/11/15 130 lb 12.8 oz (59.33 kg)  01/05/15 124 lb (56.246 kg)   General: pleasant, alert, looks old for age and somewhat ill.  Head:  No asymmetry or signs trauma  Eyes:  No icterus or conj pallor Ears:  Not HOH  Nose:  No discharge or congestion Mouth:  Clear bil.  Teeth fair.  MM moist Neck:  No mass or JVD Lungs:  Clear bil.  No cough or dyspnea.  Porta cath on upper right chest Heart: RRR.  No MRG.   Abdomen:  Soft, NT, ND, active BS, no mass or HSM.  Marland Kitchen   Rectal: deferred.  FOBT + on sample today.  Musc/Skeltl: no joint deformity, redness Extremities:  No CCE.  Warm feet  Neurologic:  Oriented x3.  No limb weakness, no tremor Skin:  No rash, sore or telangectasia Nodes:  No cervical adenopathy   Psych:  Pleasant, relaxed, cooperative  Intake/Output from previous day:   Intake/Output this shift: Total I/O In: 50 [Blood:50] Out: -   LAB RESULTS:  Recent Labs  02/08/15 0922 02/08/15 1142  WBC 74.1* 65.9*  HGB  5.1* 5.1*  HCT 16.9* 15.2*  PLT 222 195   BMET Lab Results  Component Value Date   NA 133* 02/08/2015  NA 136 02/02/2015   NA 137 01/11/2015   K 6.0* 02/08/2015   K 4.5 02/02/2015   K 4.5 01/11/2015   CL 99 12/31/2014   CL 96 12/30/2014   CL 96 12/29/2014   CO2 17* 02/08/2015   CO2 25 02/02/2015   CO2 30* 01/11/2015   GLUCOSE 306* 02/08/2015   GLUCOSE 140 02/02/2015   GLUCOSE 98 01/11/2015   BUN 57.7* 02/08/2015   BUN 13.5 02/02/2015   BUN 12.1 01/11/2015   CREATININE 0.9 02/08/2015   CREATININE 0.8 02/02/2015   CREATININE 0.8 01/11/2015   CALCIUM 8.8 02/08/2015   CALCIUM 9.8 02/02/2015   CALCIUM 9.5 01/11/2015   LFT  Recent Labs  02/08/15 0922  PROT 6.2*  ALBUMIN 2.7*  AST 48*  ALT 43  ALKPHOS 417*  BILITOT 0.75   PT/INR Lab Results  Component Value Date   INR 1.30 02/08/2015   INR 1.28 12/28/2014   INR 1.08 11/21/2014    RADIOLOGY STUDIES: No results found.  ENDOSCOPIC STUDIES: None in Epic.    IMPRESSION:   *  Metastatic colon cancer.  Undergoing Chemo  *  UGIB assumed with black and FOBT + stools, elevated BUN. R/o PUD, r/o MWT given post chemo emesis (but no hematemesis)  *  ABL anemia.    *  Non-ischemic CM   PLAN:     *  Set up for EGD late this afternoon, d/w pt and his daughter translated.    Azucena Freed  02/08/2015, 1:11 PM Pager: [phone]      Attending physician's note   I have taken a history, examined the patient and reviewed the chart. I agree with the Advanced Practitioner's note, impression and recommendations. Acute GI bleed, likely upper source, in patient with metastatic colon cancer undergoing chemotherapy. ABL anemia with Hb=5.1. WBC=74.1k. Transfusions to keep Hb > 8. IV PPI infusion. EGD urgently today.   Ladene Artist, MD [facility]

## 2015-02-08 NOTE — ED Notes (Signed)
Will administer antibiotics right after lab draw attempt

## 2015-02-08 NOTE — ED Notes (Addendum)
Unable to give report to ICU. Per Junie Panning RN she will call back when they are ready. Charge RN Delsa Sale made aware.

## 2015-02-08 NOTE — ED Notes (Signed)
Hospitalist at bedside 

## 2015-02-08 NOTE — H&P (Signed)
History and Physical  Jason Reilly TDV:[id number] DOB: 1946/10/02 DOA: 02/08/2015   PCP: [name], MD   Chief Complaint: N/V, generalized weakness  HPI:  69 year old male with a history of metastatic colon cancer, diabetes mellitus, nonischemic cardiomyopathy, hypertension presented to the [facility] on the day of admission with generalized weakness. The patient has been complaining of 2-3 day history of nausea, vomiting, and melanotic stools. The patient last received chemo (FOLFOX) on 02/02/2015. Apparently, the patient received Neulasta support 3 days after chemo.  The patient presented to the [facility] where blood work revealed hemoglobin of 5.1 with WBC 74.1. As a result, the patient was transferred to the emergency department for further evaluation. Repeat labs revealed WBC 65.9 with hemoglobin 5.1. The patient was also mildly hypotensive with a blood pressure 85/48. He complained of generalized weakness but denied any chest discomfort, shortness of breath, dizziness, syncope. He denies any hematochezia, melena, dysuria, hematuria, hematemesis, epistaxis, hemoptysis. Hemoccult performed by emergency department physician revealed a lot of stool that was Hemoccult positive. As a result, an order was placed for 3 units PRBC and on-call GI was called by EDP.  The patient was also started on a Protonix drip. Marland KitchenNotably, the patient takes aspirin 81 mg daily but denies any NSAIDs or over-the-counter supplements.  In the emergency department, the patient was initially hypotensive. This improved with 1 L normal saline with a blood pressure 116/50. The patient was mildly tachycardic with heart rate 112. Sodium was 133, potassium 2.0, BUN 57, creatinine 0.9.  Assessment/Plan: Acute blood loss anemia -Suspect upper GI loss -EDP has contacted gastroenterology -For now, place pt on clear liquids -3 units PRBCs have been ordered -INR 1.30 -Continue Protonix drip pending GI  evaluation -trend Hgb -Discontinue aspirin Leukocytosis -This may be an partly due to the patient's recent injection of Neulasta -Chest x-ray -Blood culture x2 sets -UA and urine culture -Although patient's presentation of tachycardia and hypotension may be due to volume depletion, empirically starting antibiotics pending culture data -Procalcitonin -Check lactic acid Diabetes mellitus type 2 ,uncontrolled -Hemoglobin A1c 8.2 on 12/28/2014  -Although the patient does have a metabolic acidosis this time, I believe that this may be due to the patient's hypovolemia and acute blood loss anemia  -Certainly, if the patient has ketones in her urine, the patient will be started on intravenous insulin  -Discontinue metformin  And tradjenta Metastatic colon cancer  -The patient has metastasis to the lungs, adrenal glands, and liver  -Recently received fourth cycle of FOLFOX Neulasta support  -Place on Dr. Ernestina Penna Census Hypotension  -Likely due to acute blood loss anemia/volume depletion -Transfuse 3 units PRBC  -IV fluids  -Discontinue lisinopril HCTZ  Chronic systolic and diastolic CHF -Clinically appears volume depleted  -Monitor clinically for fluid overload -12/29/2014 echo EF 40-45 percent, grade 1 diastolic dysfunction Transaminasemia -likely due to liver mets -stable -monitor clinically     Past Medical History  Diagnosis Date  . Tachycardia   . Abnormal EKG   . Hypertension   . Non-ischemic cardiomyopathy   . Hepatitis B    Past Surgical History  Procedure Laterality Date  . Nuclear stress test  03/03/2013    High risk - consistent with nonischemic cardiomyopathy  . Left and right heart catheterization with coronary angiogram N/A 03/29/2013    Procedure: LEFT AND RIGHT HEART CATHETERIZATION WITH CORONARY ANGIOGRAM;  Surgeon: Pixie Casino, MD;  Location: [facility] CATH LAB;  Service: Cardiovascular;  Laterality: N/A;  Social History:  reports that he has quit smoking. He  has never used smokeless tobacco. He reports that he drinks alcohol. He reports that he does not use illicit drugs.   No family history on file.   No Known Allergies    Prior to Admission medications   Medication Sig Start Date End Date Taking? Authorizing Provider  aspirin EC 81 MG tablet Take 1 tablet (81 mg total) by mouth daily. 03/09/13  Yes Pixie Casino, MD  clindamycin (CLINDAGEL) 1 % gel Apply topically 2 (two) times daily. 01/11/15  Yes Truitt Merle, MD  hydrocortisone 2.5 % cream Apply topically 2 (two) times daily. Once daily on face and twice daily on body 01/11/15  Yes Truitt Merle, MD  lidocaine-prilocaine (EMLA) cream Apply 1 application topically as needed. Apply to [facility] cath at least one hour before needle stick. 11/30/14  Yes Truitt Merle, MD  linagliptin (TRADJENTA) 5 MG TABS tablet Take 1 tablet (5 mg total) by mouth daily. 01/02/15  Yes Theodis Blaze, MD  lisinopril-hydrochlorothiazide (PRINZIDE,ZESTORETIC) 10-12.5 MG per tablet Take 1 tablet by mouth daily. 12/02/14  Yes Historical Provider, MD  loperamide (IMODIUM A-D) 2 MG tablet Take 2 mg by mouth 4 (four) times daily as needed for diarrhea or loose stools.   Yes Historical Provider, MD  metFORMIN (GLUCOPHAGE) 500 MG tablet Take by mouth 2 (two) times daily with a meal.   Yes Historical Provider, MD  mirtazapine (REMERON) 15 MG tablet Take 1 tablet (15 mg total) by mouth at bedtime. 11/02/14  Yes Truitt Merle, MD  ondansetron (ZOFRAN) 8 MG tablet Take 1 tablet (8 mg total) by mouth every 8 (eight) hours as needed for nausea or vomiting. 12/31/14  Yes Theodis Blaze, MD    Review of Systems:  Constitutional:  No weight loss, night sweats, Fevers, chills Head&Eyes: No headache.  No vision loss.  No eye pain or scotoma ENT:  No Difficulty swallowing,Tooth/dental problems,Sore throat,  No ear ache, post nasal drip,  Cardio-vascular:  No chest pain, Orthopnea, PND, swelling in lower extremities,  dizziness, palpitations  GI:  No   abdominal pain,diarrhea, loss of appetite, hematochezia, melena, heartburn, indigestion, Resp:  No shortness of breath with exertion or at rest. No cough. No coughing up of blood .No wheezing. Skin:  no rash or lesions.  GU:  no dysuria, change in color of urine, no urgency or frequency. No flank pain.  Musculoskeletal:  No joint pain or swelling. No decreased range of motion. No back pain.  Psych:  No change in mood or affect. No depression or anxiety. Neurologic: No headache, no dysesthesia, no focal weakness, no vision loss. No syncope  Physical Exam: Filed Vitals:   02/08/15 1201 02/08/15 1215 02/08/15 1230 02/08/15 1235  BP: 116/50 113/63 124/51 120/54  Pulse: 112 113 113 115  Temp:    97.7 F (36.5 C)  TempSrc:    Oral  Resp: 18 20 20 20   SpO2: 95% 100% 100% 98%   General:  A&O x 3, NAD, nontoxic, pleasant/cooperative Head/Eye: No conjunctival hemorrhage, no icterus, Geraldine/AT, No nystagmus ENT:  No icterus,  No thrush, good dentition, no pharyngeal exudate Neck:  No masses, no lymphadenpathy, no bruits CV:  RRR, no rub, no gallop, no S3 Lung:  CTAB, good air movement, no wheeze, no rhonchi Abdomen: soft/NT, +BS, nondistended, no peritoneal signs Ext: No cyanosis, No rashes, No petechiae, No lymphangitis, No edema   Labs on Admission:  Basic Metabolic Panel:  Recent Labs  Lab 02/02/15 0836 02/08/15 0922  NA 136 133*  K 4.5 6.0*  CO2 25 17*  GLUCOSE 140 306*  BUN 13.5 57.7*  CREATININE 0.8 0.9  CALCIUM 9.8 8.8   Liver Function Tests:  Recent Labs Lab 02/02/15 0836 02/08/15 0922  AST 52* 48*  ALT 42 43  ALKPHOS 285* 417*  BILITOT 0.78 0.75  PROT 8.8* 6.2*  ALBUMIN 3.2* 2.7*   No results for input(s): LIPASE, AMYLASE in the last 168 hours. No results for input(s): AMMONIA in the last 168 hours. CBC:  Recent Labs Lab 02/02/15 0835 02/08/15 0922 02/08/15 1142  WBC 10.7* 74.1* 65.9*  NEUTROABS 8.1* 68.1*  --   HGB 11.6* 5.1* 5.1*  HCT 35.2* 16.9*  15.2*  MCV 88.7 89.8 89.4  PLT 184 222 195   Cardiac Enzymes: No results for input(s): CKTOTAL, CKMB, CKMBINDEX, TROPONINI in the last 168 hours. BNP: Invalid input(s): POCBNP CBG: No results for input(s): GLUCAP in the last 168 hours.  Radiological Exams on Admission: No results found.  EKG: Independently reviewed. Sinus tachycardia, nonspecific ST changes.   Time spent:70 minutes Code Status:   FULL Family Communication:   Daughter updated at bedside   Wendal Wilkie, DO  [facility] Pager [phone]  If 7PM-7AM, please contact night-coverage [email or web address] Password [facility] 02/08/2015, 12:42 PM

## 2015-02-08 NOTE — ED Notes (Signed)
MD at bedside. 

## 2015-02-08 NOTE — Interval H&P Note (Signed)
History and Physical Interval Note:  02/08/2015 4:44 PM  Jason Reilly  has presented today for surgery, with the diagnosis of passing dark stool and anemia.  The various methods of treatment have been discussed with the patient and family. After consideration of risks, benefits and other options for treatment, the patient has consented to  Procedure(s): ESOPHAGOGASTRODUODENOSCOPY (EGD) (N/A) as a surgical intervention .  The patient's history has been reviewed, patient examined, no change in status, stable for surgery.  I have reviewed the patient's chart and labs.  Questions were answered to the patient's satisfaction.     Pricilla Riffle. Fuller Plan MD

## 2015-02-08 NOTE — ED Notes (Signed)
Bed: RESA Expected date: 02/08/15 Expected time: 10:22 AM Means of arrival:  Comments: [facility] pt gi bleed

## 2015-02-08 NOTE — ED Notes (Addendum)
Pt from [facility] center c/o nausea, vomiting, adn dark stools x few days. HX of Colon CA. Per labs at [facility] center today pt hemoglobin 5.1. Pt is weak. Denies pain. Last chemo Saturday.

## 2015-02-09 ENCOUNTER — Encounter (HOSPITAL_COMMUNITY): Payer: Self-pay | Admitting: Gastroenterology

## 2015-02-09 DIAGNOSIS — E119 Type 2 diabetes mellitus without complications: Secondary | ICD-10-CM

## 2015-02-09 DIAGNOSIS — I429 Cardiomyopathy, unspecified: Secondary | ICD-10-CM

## 2015-02-09 DIAGNOSIS — D649 Anemia, unspecified: Secondary | ICD-10-CM | POA: Insufficient documentation

## 2015-02-09 DIAGNOSIS — K922 Gastrointestinal hemorrhage, unspecified: Secondary | ICD-10-CM

## 2015-02-09 DIAGNOSIS — D72829 Elevated white blood cell count, unspecified: Secondary | ICD-10-CM

## 2015-02-09 DIAGNOSIS — D500 Iron deficiency anemia secondary to blood loss (chronic): Secondary | ICD-10-CM

## 2015-02-09 DIAGNOSIS — C187 Malignant neoplasm of sigmoid colon: Secondary | ICD-10-CM

## 2015-02-09 DIAGNOSIS — K250 Acute gastric ulcer with hemorrhage: Secondary | ICD-10-CM

## 2015-02-09 LAB — CBC
HCT: 23.6 % — ABNORMAL LOW (ref 39.0–52.0)
HCT: 23.2 % — ABNORMAL LOW (ref 39.0–52.0)
Hemoglobin: 8.1 g/dL — ABNORMAL LOW (ref 13.0–17.0)
Hemoglobin: 7.9 g/dL — ABNORMAL LOW (ref 13.0–17.0)
MCH: 29.3 pg (ref 26.0–34.0)
MCH: 29.4 pg (ref 26.0–34.0)
MCHC: 34.3 g/dL (ref 30.0–36.0)
MCHC: 34.1 g/dL (ref 30.0–36.0)
MCV: 85.5 fL (ref 78.0–100.0)
MCV: 86.2 fL (ref 78.0–100.0)
Platelets: 140 10*3/uL — ABNORMAL LOW (ref 150–400)
Platelets: 130 10*3/uL — ABNORMAL LOW (ref 150–400)
RBC: 2.76 MIL/uL — ABNORMAL LOW (ref 4.22–5.81)
RBC: 2.69 MIL/uL — ABNORMAL LOW (ref 4.22–5.81)
RDW: 16.1 % — AB (ref 11.5–15.5)
RDW: 16.6 % — ABNORMAL HIGH (ref 11.5–15.5)
WBC: 25.9 10*3/uL — AB (ref 4.0–10.5)
WBC: 23.9 10*3/uL — ABNORMAL HIGH (ref 4.0–10.5)

## 2015-02-09 LAB — GLUCOSE, CAPILLARY
GLUCOSE-CAPILLARY: 108 mg/dL — AB (ref 70–99)
Glucose-Capillary: 155 mg/dL — ABNORMAL HIGH (ref 70–99)
Glucose-Capillary: 157 mg/dL — ABNORMAL HIGH (ref 70–99)
Glucose-Capillary: 83 mg/dL (ref 70–99)

## 2015-02-09 LAB — LACTIC ACID, PLASMA: LACTIC ACID, VENOUS: 2.2 mmol/L — AB (ref 0.5–2.0)

## 2015-02-09 LAB — COMPREHENSIVE METABOLIC PANEL
ALT: 39 U/L (ref 0–53)
AST: 44 U/L — AB (ref 0–37)
Albumin: 2.6 g/dL — ABNORMAL LOW (ref 3.5–5.2)
Alkaline Phosphatase: 255 U/L — ABNORMAL HIGH (ref 39–117)
Anion gap: 5 (ref 5–15)
BILIRUBIN TOTAL: 0.8 mg/dL (ref 0.3–1.2)
BUN: 34 mg/dL — ABNORMAL HIGH (ref 6–23)
CHLORIDE: 106 mmol/L (ref 96–112)
CO2: 24 mmol/L (ref 19–32)
Calcium: 8.1 mg/dL — ABNORMAL LOW (ref 8.4–10.5)
Creatinine, Ser: 0.82 mg/dL (ref 0.50–1.35)
GFR calc Af Amer: >90 mL/min (ref >90)
Glucose, Bld: 136 mg/dL — ABNORMAL HIGH (ref 70–99)
POTASSIUM: 4.5 mmol/L (ref 3.5–5.1)
Sodium: 135 mmol/L (ref 135–145)
Total Protein: 5.6 g/dL — ABNORMAL LOW (ref 6.0–8.3)

## 2015-02-09 LAB — URINE CULTURE
CULTURE: NO GROWTH
Colony Count: NO GROWTH

## 2015-02-09 LAB — TYPE AND SCREEN
ABO/RH(D): O POS
ANTIBODY SCREEN: NEGATIVE
UNIT DIVISION: 0
Unit division: 0
Unit division: 0

## 2015-02-09 LAB — HEMOGLOBIN AND HEMATOCRIT, BLOOD
HCT: 22.6 % — ABNORMAL LOW (ref 39.0–52.0)
Hemoglobin: 7.7 g/dL — ABNORMAL LOW (ref 13.0–17.0)

## 2015-02-09 LAB — PROCALCITONIN: PROCALCITONIN: 0.92 ng/mL

## 2015-02-09 MED ORDER — SODIUM CHLORIDE 0.9 % IV BOLUS (SEPSIS)
500.0000 mL | Freq: Once | INTRAVENOUS | Status: AC
  Administered 2015-02-09: 500 mL via INTRAVENOUS

## 2015-02-09 MED ORDER — SODIUM CHLORIDE 0.9 % IV SOLN: INTRAVENOUS | Status: DC

## 2015-02-09 MED ORDER — CETYLPYRIDINIUM CHLORIDE 0.05 % MT LIQD
7.0000 mL | Freq: Two times a day (BID) | OROMUCOSAL | Status: DC
  Administered 2015-02-09 – 2015-02-10 (×5): 7 mL via OROMUCOSAL

## 2015-02-09 NOTE — Progress Notes (Signed)
Patient ID: Jason Reilly, male   DOB: 06-29-1946, 69 y.o.   MRN: [id number]    Progress Note   Subjective  In good spirits, very appreciative of care. No further melena, tolerating liquids HGB 8.1   Objective   Vital signs in last 24 hours: Temp:  [97.5 F (36.4 C)-98.5 F (36.9 C)] 97.8 F (36.6 C) (04/14 0702) Pulse Rate:  [110-126] 115 (04/13 1600) Resp:  [12-25] 15 (04/14 0700) BP: (53-152)/(30-116) 83/45 mmHg (04/14 0700) SpO2:  [88 %-100 %] 100 % (04/14 0700) Weight:  [127 lb 13.9 oz (58 kg)-136 lb 7.4 oz (61.9 kg)] 136 lb 7.4 oz (61.9 kg) (04/14 0342) Last BM Date: 02/08/15 General:    Older asian in NAD, wife at bedside Heart:  Regular rate and rhythm; no murmurs Lungs: Respirations even and unlabored, lungs CTA bilaterally Abdomen:  Soft, nontender and nondistended. Normal bowel sounds. Extremities:  Without edema. Neurologic:  Alert and oriented,  grossly normal neurologically. Psych:  Cooperative. Normal mood and affect.  Intake/Output from previous day: 04/13 0701 - 04/14 0700 In: 2394.3 [I.V.:1151.3; Blood:993; IV Piggyback:250] Out: 400 [Urine:400] Intake/Output this shift: Total I/O In: -  Out: 200 [Urine:200]  Lab Results:  Recent Labs  02/08/15 0922 02/08/15 1142 02/08/15 2300 02/09/15 0300  WBC 74.1* 65.9*  --  25.9*  HGB 5.1* 5.1* 8.5* 8.1*  HCT 16.9* 15.2* 24.6* 23.6*  PLT 222 195  --  140*   BMET  Recent Labs  02/08/15 0922 02/09/15 0300  NA 133* 135  K 6.0* 4.5  CL  --  106  CO2 17* 24  GLUCOSE 306* 136*  BUN 57.7* 34*  CREATININE 0.9 0.82  CALCIUM 8.8 8.1*   LFT  Recent Labs  02/09/15 0300  PROT 5.6*  ALBUMIN 2.6*  AST 44*  ALT 39  ALKPHOS 255*  BILITOT 0.8   PT/INR  Recent Labs  02/08/15 1142  LABPROT 16.3*  INR 1.30    Studies/Results: Dg Chest Port 1 View  02/08/2015   CLINICAL DATA:  Shortness of breath, leukocytosis, history hypertension, non ischemic cardiomyopathy, former smoker  EXAM: PORTABLE CHEST -  1 VIEW  COMPARISON:  Portable exam 1418 hours compared to 12/28/2014  FINDINGS: RIGHT jugular Port-A-Cath with tip projecting over mid SVC.  Borderline enlargement of cardiac silhouette.  Mediastinal contours and pulmonary vascularity normal.  Lungs clear.  No pleural effusion or pneumothorax.  No acute osseous findings.  IMPRESSION: No acute abnormalities.   Electronically Signed   By: Lavonia [name] M.D.   On: 02/08/2015 14:46       Assessment / Plan:   #1 69 yo male with acute upper GI bleed secondary to gastric ulcer- had overlying clot - epi injection performed No active bleeding, stable Needs IV PPI infusion x 72 hours total  Leave on clear liquids at least another 24 hours. Transfuse for hgb less than 7 If rebleeds will repeat EGD  #2 metastatic colon cancer - undergoing chemo  #3 NICM  Active Problems:   Non-ischemic cardiomyopathy   Metastatic colon cancer to liver   Acute blood loss anemia   Leukocytosis   Chronic combined systolic and diastolic heart failure   Gastric ulcer with hemorrhage but without obstruction   Upper GI bleed    LOS: 1 day   Amy Esterwood  02/09/2015, 10:18 AM     Attending physician's note   I have taken an interval history, reviewed the chart and examined the patient. I agree with the Advanced Practitioner's  note, impression and recommendations. Pt expressed great appreciation for our care. Stable overnight. No evidence of recurrent bleeding. Continue IV PPI infusion x 72 hours and continue clear liquids for today. Monitory CBC.   Pricilla Riffle. Fuller Plan, MD [facility]

## 2015-02-09 NOTE — Progress Notes (Signed)
[facility] PROGRESS NOTE   Jason Reilly OXB:[id number] DOB: 29-Jun-1946 DOA: 02/08/2015 PCP: [name], MD  HPI/Subjective: Feels okay, denies any specific complaints.  Denies abdominal pain, denies any hematemesis or melena.  Assessment/Plan: Active Problems:   Non-ischemic cardiomyopathy   Metastatic colon cancer to liver   Acute blood loss anemia   Leukocytosis   Chronic combined systolic and diastolic heart failure   Gastric ulcer with hemorrhage but without obstruction   Upper GI bleed   Acute blood loss anemia -Presented with hemoglobin of 5.1, baseline hemoglobin was 11.6 about a week ago. -This is likely secondary to upper GI bleed. -Status post transfusion of 3 units of packed RBCs, hemoglobin is 8.1, dropped to 7.7. -Discontinue aspirin  Upper GI bleed -Presented with anemia, positive FOBT. -GI consulted, EGD was done yesterday and showed gastric ulcer treated with epinephrine injection. -GI recommended Protonix infusion for 72 hours. -Keep in the stepdown for today.  Sepsis -Presented with leukocytosis, respiratory rate of 24 and heart rate of 1:15 consistent with sepsis. -Significant leukocytosis could be secondary to Neulasta but patient has mildly elevated lactic acid and procalcitonin. -Patient empirically on vancomycin and Zosyn, continue antibiotics. -Follow cultures including blood and urine cultures.  Diabetes mellitus type 2 ,uncontrolled -Hemoglobin A1c 8.2 on 12/28/2014  -Although the patient does have a metabolic acidosis this time, I believe that this may be due to the patient's hypovolemia and acute blood loss anemia  -Certainly, if the patient has ketones in her urine, the patient will be started on intravenous insulin  -Discontinue metformin And tradjenta  Metastatic colon cancer  -The patient has metastasis to the lungs, adrenal glands, and liver  -Recently received fourth cycle of FOLFOX Neulasta support  -Place on Dr.  Ernestina Penna Census  Hypotension  -Likely due to acute blood loss anemia/volume depletion -Transfuse 3 units PRBC  -IV fluids  -Discontinue lisinopril HCTZ   Chronic systolic and diastolic CHF -Clinically appears volume depleted  -Monitor clinically for fluid overload -12/29/2014 echo EF 40-45 percent, grade 1 diastolic dysfunction  Transaminasemia -likely due to liver mets -stable -monitor clinically   Code Status: Full Code Family Communication: Plan discussed with the patient. Disposition Plan: Remains inpatient Diet: Diet clear liquid Room service appropriate?: Yes; Fluid consistency:: Thin  Consultants:  GI  Procedures:  EGD  Antibiotics: Vancomycin and Zosyn   Objective: Filed Vitals:   02/09/15 1215  BP: 87/53  Pulse: 87  Temp: 97.9 F (36.6 C)  Resp: 15    Intake/Output Summary (Last 24 hours) at 02/09/15 1235 Last data filed at 02/09/15 1100  Gross per 24 hour  Intake 3019.25 ml  Output    600 ml  Net 2419.25 ml   Filed Weights   02/08/15 1530 02/09/15 0342  Weight: 58 kg (127 lb 13.9 oz) 61.9 kg (136 lb 7.4 oz)    Exam: General: Alert and awake, oriented x3, not in any acute distress. HEENT: anicteric sclera, pupils reactive to light and accommodation, EOMI CVS: S1-S2 clear, no murmur rubs or gallops Chest: clear to auscultation bilaterally, no wheezing, rales or rhonchi Abdomen: soft nontender, nondistended, normal bowel sounds, no organomegaly Extremities: no cyanosis, clubbing or edema noted bilaterally Neuro: Cranial nerves II-XII intact, no focal neurological deficits  Data Reviewed: Basic Metabolic Panel:  Recent Labs Lab 02/08/15 0922 02/09/15 0300  NA 133* 135  K 6.0* 4.5  CL  --  106  CO2 17* 24  GLUCOSE 306* 136*  BUN 57.7* 34*  CREATININE 0.9 0.82  CALCIUM 8.8 8.1*   Liver Function Tests:  Recent Labs Lab 02/08/15 0922 02/09/15 0300  AST 48* 44*  ALT 43 39  ALKPHOS 417* 255*  BILITOT 0.75 0.8  PROT 6.2*  5.6*  ALBUMIN 2.7* 2.6*   No results for input(s): LIPASE, AMYLASE in the last 168 hours. No results for input(s): AMMONIA in the last 168 hours. CBC:  Recent Labs Lab 02/08/15 0922 02/08/15 1142 02/08/15 2300 02/09/15 0300 02/09/15 1050  WBC 74.1* 65.9*  --  25.9*  --   NEUTROABS 68.1*  --   --   --   --   HGB 5.1* 5.1* 8.5* 8.1* 7.7*  HCT 16.9* 15.2* 24.6* 23.6* 22.6*  MCV 89.8 89.4  --  85.5  --   PLT 222 195  --  140*  --    Cardiac Enzymes: No results for input(s): CKTOTAL, CKMB, CKMBINDEX, TROPONINI in the last 168 hours. BNP (last 3 results) No results for input(s): BNP in the last 8760 hours.  ProBNP (last 3 results) No results for input(s): PROBNP in the last 8760 hours.  CBG:  Recent Labs Lab 02/08/15 1407 02/08/15 1552 02/08/15 2140 02/09/15 0815 02/09/15 1211  GLUCAP 249* 229* 153* 155* 157*    Micro Recent Results (from the past 240 hour(s))  Culture, blood (routine x 2)     Status: None (Preliminary result)   Collection Time: 02/08/15  2:51 PM  Result Value Ref Range Status   Specimen Description LEFT ANTECUBITAL  Final   Special Requests NONE  Final   Culture   Final           BLOOD CULTURE RECEIVED NO GROWTH TO DATE CULTURE WILL BE HELD FOR 5 DAYS BEFORE ISSUING A FINAL NEGATIVE REPORT Performed at [facility]    Report Status PENDING  Incomplete  MRSA PCR Screening     Status: None   Collection Time: 02/08/15  3:30 PM  Result Value Ref Range Status   MRSA by PCR NEGATIVE NEGATIVE Final    Comment:        The GeneXpert MRSA Assay (FDA approved for NASAL specimens only), is one component of a comprehensive MRSA colonization surveillance program. It is not intended to diagnose MRSA infection nor to guide or monitor treatment for MRSA infections.      Studies: Dg Chest Port 1 View  02/08/2015   CLINICAL DATA:  Shortness of breath, leukocytosis, history hypertension, non ischemic cardiomyopathy, former smoker  EXAM: PORTABLE  CHEST - 1 VIEW  COMPARISON:  Portable exam 1418 hours compared to 12/28/2014  FINDINGS: RIGHT jugular Port-A-Cath with tip projecting over mid SVC.  Borderline enlargement of cardiac silhouette.  Mediastinal contours and pulmonary vascularity normal.  Lungs clear.  No pleural effusion or pneumothorax.  No acute osseous findings.  IMPRESSION: No acute abnormalities.   Electronically Signed   By: Lavonia [name] M.D.   On: 02/08/2015 14:46    Scheduled Meds: . antiseptic oral rinse  7 mL Mouth Rinse BID  . insulin aspart  0-9 Units Subcutaneous TID WC  . piperacillin-tazobactam (ZOSYN)  IV  3.375 g Intravenous 3 times per day  . sodium chloride  3 mL Intravenous Q12H  . vancomycin  1,000 mg Intravenous NOW  . vancomycin  750 mg Intravenous Q12H   Continuous Infusions: . sodium chloride 75 mL/hr at 02/09/15 0806  . pantoprozole (PROTONIX) infusion 8 mg/hr (02/09/15 1014)       Time spent: 35 minutes    Kyngston Pickelsimer A  [facility] Pager [phone] If 7PM-7AM, please contact night-coverage at [email or web address], password [facility] 02/09/2015, 12:35 PM  LOS: 1 day

## 2015-02-09 NOTE — Progress Notes (Signed)
CRITICAL VALUE ALERT  Critical value received: Lactic Acid 2.2  Date of notification:  02/09/15  Time of notification:  00:12  Critical value read back:Yes.    Nurse who received alert:  Melda Mermelstein D. Hashim Eichhorst  MD notified (1st page):  K. Schorr  Time of first page:  00:23  MD notified (2nd page): N/A  Time of second page: N/A  Responding MD:  Lamar Blinks  Time MD responded:  00:30

## 2015-02-09 NOTE — Care Management Note (Signed)
  Page 1 of 1   02/09/2015     10:32:23 AM CARE MANAGEMENT NOTE 02/09/2015  Patient:  Jason Reilly,Jason Reilly   Account Number:  [id number]  Date Initiated:  02/09/2015  Documentation initiated by:  DAVIS,RHONDA  Subjective/Objective Assessment:   gi bleeding with hgb 5.6 on admit requiring iv bld transfusion     Action/Plan:   home when stable   Anticipated DC Date:  02/12/2015   Anticipated DC Plan:  HOME/SELF CARE  In-house referral  Clinical Social Worker  Interpreting [place]  CM consult      Choice offered to / List presented to:             Status of service:  In process, will continue to follow Medicare Important Message given?   (If response is "NO", the following Medicare IM given date fields will be blank) Date Medicare IM given:   Medicare IM given by:   Date Additional Medicare IM given:   Additional Medicare IM given by:    Discharge Disposition:    Per UR Regulation:  Reviewed for med. necessity/level of care/duration of stay  If discussed at [place] of Stay Meetings, dates discussed:    Comments:  February 09, 2015/Rhonda L. Rosana Hoes, RN, BSN, CCM. Case Management [place] [phone] No discharge needs present of time of review.

## 2015-02-09 NOTE — Progress Notes (Signed)
Jason Reilly   DOB:1946/03/28   FW#:[id number]   OYD#:[id number]  Subjective: Pt was admitted for GI bleeding and severe anemia. Chart reviewed, saw pt in ICU. He feels better, on clear liquid diet   Objective:  Filed Vitals:   02/09/15 1600  BP:   Pulse:   Temp: 97.7 F (36.5 C)  Resp:     Body mass index is 22.71 kg/(m^2).  Intake/Output Summary (Last 24 hours) at 02/09/15 1720 Last data filed at 02/09/15 1400  Gross per 24 hour  Intake 3066.25 ml  Output    400 ml  Net 2666.25 ml     Sclerae unicteric  Oropharynx clear  No peripheral adenopathy  Lungs clear -- no rales or rhonchi  Heart regular rate and rhythm  Abdomen benign  MSK no focal spinal tenderness, no peripheral edema  Neuro nonfocal   CBG (last 3)   Recent Labs  02/09/15 0815 02/09/15 1211 02/09/15 1550  GLUCAP 155* 157* 83     Labs:  Lab Results  Component Value Date   WBC 25.9* 02/09/2015   HGB 7.7* 02/09/2015   HCT 22.6* 02/09/2015   MCV 85.5 02/09/2015   PLT 140* 02/09/2015   NEUTROABS 68.1* 02/08/2015    @LASTCHEMISTRY @  Urine Studies No results for input(s): UHGB, CRYS in the last 72 hours.  Invalid input(s): UACOL, UAPR, USPG, UPH, UTP, UGL, UKET, UBIL, UNIT, UROB, ULEU, UEPI, UWBC, URBC, UBAC, CAST, UCOM, BILUA  Basic Metabolic Panel:  Recent Labs Lab 02/08/15 0922 02/09/15 0300  NA 133* 135  K 6.0* 4.5  CL  --  106  CO2 17* 24  GLUCOSE 306* 136*  BUN 57.7* 34*  CREATININE 0.9 0.82  CALCIUM 8.8 8.1*   GFR Estimated Creatinine Clearance: 77.1 mL/min (by C-G formula based on Cr of 0.82). Liver Function Tests:  Recent Labs Lab 02/08/15 0922 02/09/15 0300  AST 48* 44*  ALT 43 39  ALKPHOS 417* 255*  BILITOT 0.75 0.8  PROT 6.2* 5.6*  ALBUMIN 2.7* 2.6*   No results for input(s): LIPASE, AMYLASE in the last 168 hours. No results for input(s): AMMONIA in the last 168 hours. Coagulation profile  Recent Labs Lab 02/08/15 1142  INR 1.30    CBC:  Recent  Labs Lab 02/08/15 0922 02/08/15 1142 02/08/15 2300 02/09/15 0300 02/09/15 1050  WBC 74.1* 65.9*  --  25.9*  --   NEUTROABS 68.1*  --   --   --   --   HGB 5.1* 5.1* 8.5* 8.1* 7.7*  HCT 16.9* 15.2* 24.6* 23.6* 22.6*  MCV 89.8 89.4  --  85.5  --   PLT 222 195  --  140*  --    Cardiac Enzymes: No results for input(s): CKTOTAL, CKMB, CKMBINDEX, TROPONINI in the last 168 hours. BNP: Invalid input(s): POCBNP CBG:  Recent Labs Lab 02/08/15 1552 02/08/15 2140 02/09/15 0815 02/09/15 1211 02/09/15 1550  GLUCAP 229* 153* 155* 157* 83   D-Dimer No results for input(s): DDIMER in the last 72 hours. Hgb A1c No results for input(s): HGBA1C in the last 72 hours. Lipid Profile No results for input(s): CHOL, HDL, LDLCALC, TRIG, CHOLHDL, LDLDIRECT in the last 72 hours. Thyroid function studies No results for input(s): TSH, T4TOTAL, T3FREE, THYROIDAB in the last 72 hours.  Invalid input(s): FREET3 Anemia work up  Recent Labs  02/08/15 1142  VITAMINB12 1684*  FOLATE 19.0  FERRITIN 372*  TIBC 280  IRON 61  RETICCTPCT 2.7    Studies:  Dg Chest [facility]  1 View  02/08/2015   CLINICAL DATA:  Shortness of breath, leukocytosis, history hypertension, non ischemic cardiomyopathy, former smoker  EXAM: PORTABLE CHEST - 1 VIEW  COMPARISON:  Portable exam 1418 hours compared to 12/28/2014  FINDINGS: RIGHT jugular Port-A-Cath with tip projecting over mid SVC.  Borderline enlargement of cardiac silhouette.  Mediastinal contours and pulmonary vascularity normal.  Lungs clear.  No pleural effusion or pneumothorax.  No acute osseous findings.  IMPRESSION: No acute abnormalities.   Electronically Signed   By: Lavonia [name] M.D.   On: 02/08/2015 14:46    Assessment: 69 y.o. male with   1. Upper GI bleeding from gastric ulcer 2. Anemia from GI bleeding and chemotherapy 3. Metastatic colon cancer, on chemotherapy 4. Leukocytosis, secondary to Neulasta 5. Nonischemic cardiomyopathy, chronic 6.  DM  Plan:  -Much improved after blood transfusion and endoscopy -Agree with PPI and other management  -I will see him back within one week of hospital discharge -he is scheduled to have a restaging CT tomorrow, can hold it for now, and we will reschedule as outpt -will hold chemo for now, until he recovers well.    Truitt Merle, MD 02/09/2015  5:20 PM

## 2015-02-10 ENCOUNTER — Ambulatory Visit (HOSPITAL_COMMUNITY): Payer: Medicare Other

## 2015-02-10 ENCOUNTER — Encounter: Payer: Self-pay | Admitting: Nurse Practitioner

## 2015-02-10 ENCOUNTER — Ambulatory Visit: Payer: Medicare Other | Admitting: Physician Assistant

## 2015-02-10 ENCOUNTER — Other Ambulatory Visit: Payer: Medicare Other

## 2015-02-10 DIAGNOSIS — R21 Rash and other nonspecific skin eruption: Secondary | ICD-10-CM | POA: Insufficient documentation

## 2015-02-10 DIAGNOSIS — E8809 Other disorders of plasma-protein metabolism, not elsewhere classified: Secondary | ICD-10-CM | POA: Insufficient documentation

## 2015-02-10 DIAGNOSIS — R112 Nausea with vomiting, unspecified: Secondary | ICD-10-CM | POA: Insufficient documentation

## 2015-02-10 DIAGNOSIS — E860 Dehydration: Secondary | ICD-10-CM | POA: Insufficient documentation

## 2015-02-10 DIAGNOSIS — E875 Hyperkalemia: Secondary | ICD-10-CM | POA: Insufficient documentation

## 2015-02-10 LAB — BASIC METABOLIC PANEL
ANION GAP: 4 — AB (ref 5–15)
BUN: 13 mg/dL (ref 6–23)
CALCIUM: 7.9 mg/dL — AB (ref 8.4–10.5)
CHLORIDE: 105 mmol/L (ref 96–112)
CO2: 25 mmol/L (ref 19–32)
Creatinine, Ser: 0.83 mg/dL (ref 0.50–1.35)
GFR, EST NON AFRICAN AMERICAN: 90 mL/min — AB (ref >90)
Glucose, Bld: 98 mg/dL (ref 70–99)
Potassium: 3.8 mmol/L (ref 3.5–5.1)
SODIUM: 134 mmol/L — AB (ref 135–145)

## 2015-02-10 LAB — CBC
HCT: 23.4 % — ABNORMAL LOW (ref 39.0–52.0)
HCT: 22.5 % — ABNORMAL LOW (ref 39.0–52.0)
HEMATOCRIT: 23.6 % — AB (ref 39.0–52.0)
HEMOGLOBIN: 8 g/dL — AB (ref 13.0–17.0)
Hemoglobin: 7.5 g/dL — ABNORMAL LOW (ref 13.0–17.0)
Hemoglobin: 8.1 g/dL — ABNORMAL LOW (ref 13.0–17.0)
MCH: 29.6 pg (ref 26.0–34.0)
MCH: 28.8 pg (ref 26.0–34.0)
MCH: 30.2 pg (ref 26.0–34.0)
MCHC: 34.2 g/dL (ref 30.0–36.0)
MCHC: 33.3 g/dL (ref 30.0–36.0)
MCHC: 34.3 g/dL (ref 30.0–36.0)
MCV: 86.7 fL (ref 78.0–100.0)
MCV: 86.5 fL (ref 78.0–100.0)
MCV: 88.1 fL (ref 78.0–100.0)
Platelets: 128 10*3/uL — ABNORMAL LOW (ref 150–400)
Platelets: 121 10*3/uL — ABNORMAL LOW (ref 150–400)
Platelets: 157 10*3/uL (ref 150–400)
RBC: 2.7 MIL/uL — AB (ref 4.22–5.81)
RBC: 2.6 MIL/uL — ABNORMAL LOW (ref 4.22–5.81)
RBC: 2.68 MIL/uL — ABNORMAL LOW (ref 4.22–5.81)
RDW: 16.6 % — ABNORMAL HIGH (ref 11.5–15.5)
RDW: 17.1 % — AB (ref 11.5–15.5)
RDW: 17.5 % — AB (ref 11.5–15.5)
WBC: 26.2 10*3/uL — ABNORMAL HIGH (ref 4.0–10.5)
WBC: 23.9 10*3/uL — AB (ref 4.0–10.5)
WBC: 23.2 10*3/uL — AB (ref 4.0–10.5)

## 2015-02-10 LAB — GLUCOSE, CAPILLARY
Glucose-Capillary: 109 mg/dL — ABNORMAL HIGH (ref 70–99)
Glucose-Capillary: 141 mg/dL — ABNORMAL HIGH (ref 70–99)
Glucose-Capillary: 130 mg/dL — ABNORMAL HIGH (ref 70–99)
Glucose-Capillary: 112 mg/dL — ABNORMAL HIGH (ref 70–99)

## 2015-02-10 LAB — PROCALCITONIN: Procalcitonin: 0.48 ng/mL

## 2015-02-10 MED ORDER — ZOLPIDEM TARTRATE 5 MG PO TABS
5.0000 mg | ORAL_TABLET | Freq: Every evening | ORAL | Status: DC | PRN
Start: 2015-02-10 — End: 2015-02-12
  Administered 2015-02-10 – 2015-02-11 (×2): 5 mg via ORAL
  Filled 2015-02-10 (×2): qty 1

## 2015-02-10 NOTE — Assessment & Plan Note (Signed)
Albumin decreased to 2.7.  Pt has had little appetite; and will need to increase protein in diet once discharged.

## 2015-02-10 NOTE — Assessment & Plan Note (Signed)
Blood sugar elevated to 306 today. Pt has not been dx'd with diabetes; and hyperglycemia most likely secondary to acute illness today. Pt will be transferred to the ED for further eval and management.

## 2015-02-10 NOTE — Progress Notes (Signed)
[facility]NAGEMENT [facility]   HPI: Jason Reilly 69 y.o. male diagnosed with colon cancer; with metastasis to liver, lungs, abdomen, and left adrenal gland. Currently undergoing folfox and panitumumab chemotherapy.   Pt c/o approx 36 hours dark stool, increased weakness, mild SOB with any exertion, and chronic nausea. Denies any fever or chills.   Chemotherapy-induced rash is slowly improving.   HPI  ROS  Past Medical History  Diagnosis Date  . Tachycardia   . Abnormal EKG   . Hypertension   . Non-ischemic cardiomyopathy   . Hepatitis B     Past Surgical History  Procedure Laterality Date  . Nuclear stress test  03/03/2013    High risk - consistent with nonischemic cardiomyopathy  . Left and right heart catheterization with coronary angiogram N/A 03/29/2013    Procedure: LEFT AND RIGHT HEART CATHETERIZATION WITH CORONARY ANGIOGRAM;  Surgeon: Pixie Casino, MD;  Location: [facility] CATH LAB;  Service: Cardiovascular;  Laterality: N/A;  . Esophagogastroduodenoscopy N/A 02/08/2015    Procedure: ESOPHAGOGASTRODUODENOSCOPY (EGD);  Surgeon: Ladene Artist, MD;  Location: [facility];  Service: Endoscopy;  Laterality: N/A;    has Hypertension; Abnormal nuclear cardiac imaging test; Acute systolic heart failure; Non-ischemic cardiomyopathy; Metastatic colon cancer to liver; Acute renal failure; Hyponatremia; Hyperglycemia; Weakness; Acute blood loss anemia; Leukocytosis; Chronic combined systolic and diastolic heart failure; Arterial hypotension; Gastric ulcer with hemorrhage but without obstruction; Upper GI bleed; Absolute anemia; Nausea with vomiting; Dehydration; Hyperkalemia; Hyperphosphatemia; Hypoalbuminemia; and Rash on his problem list.    has No Known Allergies.    Medication List       This list is accurate as of: 02/08/15 10:39 AM.  Always use your most recent med list.               aspirin EC 81 MG tablet  Take 1 tablet (81 mg total) by mouth daily.     clindamycin 1 %  gel  Commonly known as:  CLINDAGEL  Apply topically 2 (two) times daily.     HYDROcodone-acetaminophen 5-325 MG per tablet  Commonly known as:  NORCO/VICODIN  Take 1-2 tablets by mouth every 4 (four) hours as needed for moderate pain.     hydrocortisone 2.5 % cream  Apply topically 2 (two) times daily. Once daily on face and twice daily on body     lidocaine-prilocaine cream  Commonly known as:  EMLA  Apply 1 application topically as needed. Apply to [facility] cath at least one hour before needle stick.     linagliptin 5 MG Tabs tablet  Commonly known as:  TRADJENTA  Take 1 tablet (5 mg total) by mouth daily.     lisinopril-hydrochlorothiazide 10-12.5 MG per tablet  Commonly known as:  PRINZIDE,ZESTORETIC  Take 1 tablet by mouth daily.     loperamide 2 MG tablet  Commonly known as:  IMODIUM A-D  Take 2 mg by mouth 4 (four) times daily as needed for diarrhea or loose stools.     metFORMIN 500 MG tablet  Commonly known as:  GLUCOPHAGE  Take by mouth 2 (two) times daily with a meal.     metoprolol-hydrochlorothiazide 100-25 MG per tablet  Commonly known as:  LOPRESSOR HCT  Take 1 tablet by mouth daily.     mirtazapine 15 MG tablet  Commonly known as:  REMERON  Take 1 tablet (15 mg total) by mouth at bedtime.     ondansetron 8 MG tablet  Commonly known as:  ZOFRAN  Take 1 tablet (8  mg total) by mouth every 8 (eight) hours as needed for nausea or vomiting.         PHYSICAL EXAMINATION  Oncology Vitals 02/10/2015 02/10/2015 02/10/2015 02/10/2015 02/10/2015 02/10/2015 02/10/2015  Height - - - - - - -  Weight - - - - - - -  Weight (lbs) - - - - - - -  BMI (kg/m2) - - - - - - -  Temp 99.1 98.3 - 97.9 - - -  Pulse 101 71 - - - - -  Resp '18 18 19 13 18 16 16  ' SpO2 100 100 100 99 99 98 99  BSA (m2) - - - - - - -   BP Readings from Last 3 Encounters:  02/10/15 128/58  02/08/15 113/56  02/02/15 140/76    Physical Exam  Constitutional: He is oriented to person, place, and  time.  Pt appears very weak, frail, and chronically ill.   HENT:  Head: Normocephalic and atraumatic.  Mouth/Throat: Oropharynx is clear and moist.  Eyes: Conjunctivae and EOM are normal. Pupils are equal, round, and reactive to light. Right eye exhibits no discharge. Left eye exhibits no discharge. No scleral icterus.  Neck: Normal range of motion. Neck supple. No JVD present. No tracheal deviation present. No thyromegaly present.  Cardiovascular: Normal rate, regular rhythm, normal heart sounds and intact distal pulses.   Pulmonary/Chest: Effort normal and breath sounds normal. No respiratory distress. He has no wheezes. He has no rales. He exhibits no tenderness.  Abdominal: Soft. Bowel sounds are normal. He exhibits no distension and no mass. There is no tenderness. There is no rebound and no guarding.  Musculoskeletal: Normal range of motion. He exhibits no edema or tenderness.  Lymphadenopathy:    He has no cervical adenopathy.  Neurological: He is alert and oriented to person, place, and time.  Skin: Skin is warm and dry. No rash noted. No erythema. There is pallor.  Psychiatric: Affect normal.  Nursing note and vitals reviewed.   LABORATORY DATA:. Admission on 02/08/2015  No results displayed because visit has over 200 results.    Appointment on 02/08/2015  Component Date Value Ref Range Status  . WBC 02/08/2015 74.1* 4.0 - 10.3 10e3/uL Final  . NEUT# 02/08/2015 68.1* 1.5 - 6.5 10e3/uL Final  . HGB 02/08/2015 5.1* 13.0 - 17.1 g/dL Final  . HCT 02/08/2015 16.9* 38.4 - 49.9 % Final  . Platelets 02/08/2015 222  140 - 400 10e3/uL Final  . MCV 02/08/2015 89.8  79.3 - 98.0 fL Final  . MCH 02/08/2015 27.2  27.2 - 33.4 pg Final  . MCHC 02/08/2015 30.3* 32.0 - 36.0 g/dL Final  . RBC 02/08/2015 1.88* 4.20 - 5.82 10e6/uL Final  . RDW 02/08/2015 20.2* 11.0 - 14.6 % Final  . lymph# 02/08/2015 3.9* 0.9 - 3.3 10e3/uL Final  . MONO# 02/08/2015 1.8* 0.1 - 0.9 10e3/uL Final  . Eosinophils  Absolute 02/08/2015 0.1  0.0 - 0.5 10e3/uL Final  . Basophils Absolute 02/08/2015 0.3* 0.0 - 0.1 10e3/uL Final  . NEUT% 02/08/2015 91.8* 39.0 - 75.0 % Final  . LYMPH% 02/08/2015 5.3* 14.0 - 49.0 % Final  . MONO% 02/08/2015 2.4  0.0 - 14.0 % Final  . EOS% 02/08/2015 0.1  0.0 - 7.0 % Final  . BASO% 02/08/2015 0.4  0.0 - 2.0 % Final  . Sodium 02/08/2015 133* 136 - 145 mEq/L Final  . Potassium 02/08/2015 6.0* 3.5 - 5.1 mEq/L Final  . Chloride 02/08/2015 100  98 -  109 mEq/L Final  . CO2 02/08/2015 17* 22 - 29 mEq/L Final  . Glucose 02/08/2015 306* 70 - 140 mg/dl Final  . BUN 02/08/2015 57.7* 7.0 - 26.0 mg/dL Final  . Creatinine 02/08/2015 0.9  0.7 - 1.3 mg/dL Final  . Total Bilirubin 02/08/2015 0.75  0.20 - 1.20 mg/dL Final  . Alkaline Phosphatase 02/08/2015 417* 40 - 150 U/L Final  . AST 02/08/2015 48* 5 - 34 U/L Final  . ALT 02/08/2015 43  0 - 55 U/L Final  . Total Protein 02/08/2015 6.2* 6.4 - 8.3 g/dL Final  . Albumin 02/08/2015 2.7* 3.5 - 5.0 g/dL Final  . Calcium 02/08/2015 8.8  8.4 - 10.4 mg/dL Final  . Anion Gap 02/08/2015 15* 3 - 11 mEq/L Final  . EGFR 02/08/2015 84* >90 ml/min/1.73 m2 Final   eGFR is calculated using the CKD-EPI Creatinine Equation (2009)  . Hold Tube, Blood Bank 02/08/2015 Sample held in Blood Bank 72 hrs.   Final     RADIOGRAPHIC STUDIES: Dg Chest Port 1 View  02/08/2015   CLINICAL DATA:  Shortness of breath, leukocytosis, history hypertension, non ischemic cardiomyopathy, former smoker  EXAM: PORTABLE CHEST - 1 VIEW  COMPARISON:  Portable exam 1418 hours compared to 12/28/2014  FINDINGS: RIGHT jugular Port-A-Cath with tip projecting over mid SVC.  Borderline enlargement of cardiac silhouette.  Mediastinal contours and pulmonary vascularity normal.  Lungs clear.  No pleural effusion or pneumothorax.  No acute osseous findings.  IMPRESSION: No acute abnormalities.   Electronically Signed   By: Lavonia [name] M.D.   On: 02/08/2015 14:46    ASSESSMENT/PLAN:     Metastatic colon cancer to liver Pt received cycle 4 of his folfox/panitumumab chemotherapy on 02/02/15.  Panitumumab has been decreased by 50% due to poor tolerability.   Pt is scheduled to return for his next chemo on 02/16/15.    Hyperglycemia Blood sugar elevated to 306 today. Pt has not been dx'd with diabetes; and hyperglycemia most likely secondary to acute illness today. Pt will be transferred to the ED for further eval and management.    Weakness C/o increased weakness today; most likely secondary to severe anemia and GI bleed. Transfer to ED for further eval and management.    Upper GI bleed Pt reports approx 36 hour hx of dark stools, increased weakness, and mild SOB with any exertion.  HGB has decreased to 5.1 today.  Pt will be transferred to the ED for further eval and management of probable acute GI bleed.    Nausea with vomiting Pt c/o intermittent nausea; but no vomiting. Pt feels dehydrated as well. Pt was advised to stay NPO due to probable dx of acute GI bleed.    Dehydration Pt c/o nausea; and poor oral intake. He feels dehydrated today.  Advised tha tpt stay NPO due to probable GI bleed until further eval and management per the ED.    Hyperkalemia K+ elevated to 6.0.  Pt is not taking any potassium supplements. Most likely, secondary to acute onset illness. Will transfer pt to ED for further eval and management.    Hyperphosphatemia Alk phos elevated to 417; most likely secondary to acute illness. Pt transferred to ED for further eval and management.    Hypoalbuminemia Albumin decreased to 2.7.  Pt has had little appetite; and will need to increase protein in diet once discharged.    Rash Chemotherapy-induced rash appears to be resolving.     Exam performed with translator present.   Patient  stated understanding of all instructions; and was in agreement with this plan of care. The patient knows to call the clinic with any problems, questions or  concerns.   Review/collaboration with [name] regarding all aspects of patient's visit today.   Total time spent with patient was 40 minutes;  with greater than 75 percent of that time spent in face to face counseling regarding patient's symptoms,  and coordination of care and follow up.  Disclaimer: This note was dictated with voice recognition software. Similar sounding words can inadvertently be transcribed and may not be corrected upon review.   Drue Second, NP 02/10/2015

## 2015-02-10 NOTE — Assessment & Plan Note (Signed)
Pt reports approx 36 hour hx of dark stools, increased weakness, and mild SOB with any exertion.  HGB has decreased to 5.1 today.  Pt will be transferred to the ED for further eval and management of probable acute GI bleed.

## 2015-02-10 NOTE — Assessment & Plan Note (Signed)
Alk phos elevated to 417; most likely secondary to acute illness. Pt transferred to ED for further eval and management.

## 2015-02-10 NOTE — Progress Notes (Signed)
Patient ID: Jason Reilly, male   DOB: 1946-10-07, 69 y.o.   MRN: [id number]    Progress Note   Subjective  Feels good- hungry. No melena, no c/o pain   Objective   Vital signs in last 24 hours: Temp:  [97.7 F (36.5 C)-98.2 F (36.8 C)] 97.9 F (36.6 C) (04/15 0800) Pulse Rate:  [87] 87 (04/14 1215) Resp:  [15-18] 18 (04/15 0700) BP: (87-119)/(52-74) 116/74 mmHg (04/15 0400) SpO2:  [97 %-100 %] 99 % (04/15 0700) Last BM Date: 02/08/15 General:    [name]  in NAD- very pleasant Heart:  Regular rate and rhythm; no murmurs Lungs: Respirations even and unlabored, lungs CTA bilaterally Abdomen:  Soft, nontender and nondistended. Normal bowel sounds. Extremities:  Without edema. Neurologic:  Alert and oriented,  grossly normal neurologically. Psych:  Cooperative. Normal mood and affect.  Intake/Output from previous day: 04/14 0701 - 04/15 0700 In: 3700 [P.O.:1000; I.V.:2250; IV Piggyback:450] Out: 425 [Urine:425] Intake/Output this shift: Total I/O In: -  Out: 200 [Urine:200]  Lab Results:  Recent Labs  02/09/15 0300 02/09/15 1050 02/09/15 2100 02/10/15 0315  WBC 25.9*  --  23.9* 26.2*  HGB 8.1* 7.7* 7.9* 8.0*  HCT 23.6* 22.6* 23.2* 23.4*  PLT 140*  --  130* 128*   BMET  Recent Labs  02/08/15 0922 02/09/15 0300 02/10/15 0315  NA 133* 135 134*  K 6.0* 4.5 3.8  CL  --  106 105  CO2 17* 24 25  GLUCOSE 306* 136* 98  BUN 57.7* 34* 13  CREATININE 0.9 0.82 0.83  CALCIUM 8.8 8.1* 7.9*   LFT  Recent Labs  02/09/15 0300  PROT 5.6*  ALBUMIN 2.6*  AST 44*  ALT 39  ALKPHOS 255*  BILITOT 0.8   PT/INR  Recent Labs  02/08/15 1142  LABPROT 16.3*  INR 1.30    Studies/Results: Dg Chest Port 1 View  02/08/2015   CLINICAL DATA:  Shortness of breath, leukocytosis, history hypertension, non ischemic cardiomyopathy, former smoker  EXAM: PORTABLE CHEST - 1 VIEW  COMPARISON:  Portable exam 1418 hours compared to 12/28/2014  FINDINGS: RIGHT jugular  Port-A-Cath with tip projecting over mid SVC.  Borderline enlargement of cardiac silhouette.  Mediastinal contours and pulmonary vascularity normal.  Lungs clear.  No pleural effusion or pneumothorax.  No acute osseous findings.  IMPRESSION: No acute abnormalities.   Electronically Signed   By: Lavonia [name] M.D.   On: 02/08/2015 14:46      Assessment / Plan:   #! 69 yo [name] with metastatic colon cancer undergoing chemo with acute upper GI bleed secondary to acute Gastric ulcer/non malignant. He is stable, can move to regular bed Continue PPI infusion another 24 hours then concert to BID PPI-and discharge home on BID PPI Advance to full liquids today- soft diet tomorrow #2 anemia- stable #3 leukocytosis- felt secondary to Neulasta- trending down   We will arrange outpt follow up in office with Nicoletta Ba, PA or Dr. Fuller Plan in 4-5 weeks, and plan follow up EGD in 3 months if he remains otherwise stable    Active Problems:   Non-ischemic cardiomyopathy   Metastatic colon cancer to liver   Acute blood loss anemia   Leukocytosis   Chronic combined systolic and diastolic heart failure   Gastric ulcer with hemorrhage but without obstruction   Upper GI bleed   Absolute anemia    LOS: 2 days   Amy Esterwood  02/10/2015, 10:37 AM     Attending physician's  note   I have taken an interval history, reviewed the chart and examined the patient. I agree with the Advanced Practitioner's note, impression and recommendations. No recurrent bleeding. Advance diet to full liquids today and soft tomorrow. IV PPI infusion until tomorrow and then PPI BID. Should be ok for discharge on Saturday or Sunday. Check H pylori Ab and treat if positive. GI follow up in 4-5 weeks.   Pricilla Riffle. Fuller Plan, MD [facility]

## 2015-02-10 NOTE — Assessment & Plan Note (Signed)
K+ elevated to 6.0.  Pt is not taking any potassium supplements. Most likely, secondary to acute onset illness. Will transfer pt to ED for further eval and management.

## 2015-02-10 NOTE — Assessment & Plan Note (Signed)
Pt received cycle 4 of his folfox/panitumumab chemotherapy on 02/02/15.  Panitumumab has been decreased by 50% due to poor tolerability.   Pt is scheduled to return for his next chemo on 02/16/15.

## 2015-02-10 NOTE — Assessment & Plan Note (Signed)
C/o increased weakness today; most likely secondary to severe anemia and GI bleed. Transfer to ED for further eval and management.

## 2015-02-10 NOTE — Assessment & Plan Note (Signed)
Pt c/o nausea; and poor oral intake. He feels dehydrated today.  Advised tha tpt stay NPO due to probable GI bleed until further eval and management per the ED.

## 2015-02-10 NOTE — Assessment & Plan Note (Signed)
Chemotherapy-induced rash appears to be resolving.

## 2015-02-10 NOTE — Assessment & Plan Note (Signed)
Pt c/o intermittent nausea; but no vomiting. Pt feels dehydrated as well. Pt was advised to stay NPO due to probable dx of acute GI bleed.

## 2015-02-10 NOTE — Progress Notes (Signed)
[facility] PROGRESS NOTE   Bralynn Donado XKG:[id number] DOB: [birth year] DOA: 02/08/2015 PCP: [name], MD  HPI/Subjective: Feels better, denies nausea, vomiting, hematemesis or melena. Hemoglobin is stable.  Assessment/Plan: Active Problems:   Non-ischemic cardiomyopathy   Metastatic colon cancer to liver   Acute blood loss anemia   Leukocytosis   Chronic combined systolic and diastolic heart failure   Gastric ulcer with hemorrhage but without obstruction   Upper GI bleed   Absolute anemia   Acute blood loss anemia -Presented with hemoglobin of 5.1, baseline hemoglobin was 11.6 about a week ago. -This is likely secondary to upper GI bleed. -Status post transfusion of 3 units of packed RBCs, hemoglobin is 8.1, dropped to 7.7. -Discontinue aspirin -Hemoglobin currently stable, DC CBC every 8 hours and check hemoglobin in the morning.  Upper GI bleed -Presented with anemia, positive FOBT. -GI consulted, EGD was done yesterday and showed gastric ulcer treated with epinephrine injection. -Continue Protonix infusion until tomorrow, full liquids.  Sepsis -Presented with leukocytosis, respiratory rate of 24 and heart rate of 1:15 consistent with sepsis. -Significant leukocytosis could be secondary to Neulasta but patient has mildly elevated lactic acid and procalcitonin. -Patient empirically on vancomycin and Zosyn, continue antibiotics. -Follow cultures including blood and urine cultures. If continues to have no fevers, might discontinue antibiotics in the morning.  Diabetes mellitus type 2 ,uncontrolled -Hemoglobin A1c 8.2 on 12/28/2014  -Although the patient does have a metabolic acidosis this time, I believe that this may be due to the patient's hypovolemia and acute blood loss anemia  -Certainly, if the patient has ketones in her urine, the patient will be started on intravenous insulin  -Discontinue metformin And tradjenta  Metastatic colon cancer  -The  patient has metastasis to the lungs, adrenal glands, and liver  -Recently received fourth cycle of FOLFOX Neulasta support  -Place on Dr. Ernestina Penna Census  Hypotension  -Likely due to acute blood loss anemia/volume depletion -Transfuse 3 units PRBC  -IV fluids  -Discontinue lisinopril HCTZ   Chronic systolic and diastolic CHF -Clinically appears volume depleted  -Monitor clinically for fluid overload -12/29/2014 echo EF 40-45 percent, grade 1 diastolic dysfunction  Transaminasemia -likely due to liver mets -stable -monitor clinically   Code Status: Full Code Family Communication: Plan discussed with the patient. Disposition Plan: Remains inpatient Diet: Diet full liquid Room service appropriate?: Yes; Fluid consistency:: Thin  Consultants:  GI  Procedures:  EGD  Antibiotics: Vancomycin and Zosyn   Objective: Filed Vitals:   02/10/15 0800  BP:   Pulse:   Temp: 97.9 F (36.6 C)  Resp:     Intake/Output Summary (Last 24 hours) at 02/10/15 1138 Last data filed at 02/10/15 0900  Gross per 24 hour  Intake   3075 ml  Output    425 ml  Net   2650 ml   Filed Weights   02/08/15 1530 02/09/15 0342  Weight: 58 kg (127 lb 13.9 oz) 61.9 kg (136 lb 7.4 oz)    Exam: General: Alert and awake, oriented x3, not in any acute distress. HEENT: anicteric sclera, pupils reactive to light and accommodation, EOMI CVS: S1-S2 clear, no murmur rubs or gallops Chest: clear to auscultation bilaterally, no wheezing, rales or rhonchi Abdomen: soft nontender, nondistended, normal bowel sounds, no organomegaly Extremities: no cyanosis, clubbing or edema noted bilaterally Neuro: Cranial nerves II-XII intact, no focal neurological deficits  Data Reviewed: Basic Metabolic Panel:  Recent Labs Lab 02/08/15 0922 02/09/15 0300 02/10/15 0315  NA 133* 135 134*  K 6.0* 4.5 3.8  CL  --  106 105  CO2 17* 24 25  GLUCOSE 306* 136* 98  BUN 57.7* 34* 13  CREATININE 0.9 0.82 0.83    CALCIUM 8.8 8.1* 7.9*   Liver Function Tests:  Recent Labs Lab 02/08/15 0922 02/09/15 0300  AST 48* 44*  ALT 43 39  ALKPHOS 417* 255*  BILITOT 0.75 0.8  PROT 6.2* 5.6*  ALBUMIN 2.7* 2.6*   No results for input(s): LIPASE, AMYLASE in the last 168 hours. No results for input(s): AMMONIA in the last 168 hours. CBC:  Recent Labs Lab 02/08/15 0922  02/08/15 1142 02/08/15 2300 02/09/15 0300 02/09/15 1050 02/09/15 2100 02/10/15 0315  WBC 74.1*  --  65.9*  --  25.9*  --  23.9* 26.2*  NEUTROABS 68.1*  --   --   --   --   --   --   --   HGB 5.1*  < > 5.1* 8.5* 8.1* 7.7* 7.9* 8.0*  HCT 16.9*  < > 15.2* 24.6* 23.6* 22.6* 23.2* 23.4*  MCV 89.8  --  89.4  --  85.5  --  86.2 86.7  PLT 222  --  195  --  140*  --  130* 128*  < > = values in this interval not displayed. Cardiac Enzymes: No results for input(s): CKTOTAL, CKMB, CKMBINDEX, TROPONINI in the last 168 hours. BNP (last 3 results) No results for input(s): BNP in the last 8760 hours.  ProBNP (last 3 results) No results for input(s): PROBNP in the last 8760 hours.  CBG:  Recent Labs Lab 02/09/15 0815 02/09/15 1211 02/09/15 1550 02/09/15 2156 02/10/15 0844  GLUCAP 155* 157* 83 108* 109*    Micro Recent Results (from the past 240 hour(s))  Urine culture     Status: None   Collection Time: 02/08/15  2:14 PM  Result Value Ref Range Status   Specimen Description URINE, RANDOM  Final   Special Requests NONE  Final   Colony Count NO GROWTH Performed at [facility]   Final   Culture NO GROWTH Performed at [facility]   Final   Report Status 02/09/2015 FINAL  Final  Culture, blood (routine x 2)     Status: None (Preliminary result)   Collection Time: 02/08/15  2:51 PM  Result Value Ref Range Status   Specimen Description LEFT ANTECUBITAL  Final   Special Requests NONE  Final   Culture   Final           BLOOD CULTURE RECEIVED NO GROWTH TO DATE CULTURE WILL BE HELD FOR 5 DAYS BEFORE ISSUING A  FINAL NEGATIVE REPORT Performed at [facility]    Report Status PENDING  Incomplete  MRSA PCR Screening     Status: None   Collection Time: 02/08/15  3:30 PM  Result Value Ref Range Status   MRSA by PCR NEGATIVE NEGATIVE Final    Comment:        The GeneXpert MRSA Assay (FDA approved for NASAL specimens only), is one component of a comprehensive MRSA colonization surveillance program. It is not intended to diagnose MRSA infection nor to guide or monitor treatment for MRSA infections.   Culture, blood (single)     Status: None (Preliminary result)   Collection Time: 02/09/15  3:44 AM  Result Value Ref Range Status   Specimen Description BLOOD LEFT ANTECUBITAL  Final   Special Requests BOTTLES DRAWN AEROBIC ONLY 5ML  Final   Culture  Final           BLOOD CULTURE RECEIVED NO GROWTH TO DATE CULTURE WILL BE HELD FOR 5 DAYS BEFORE ISSUING A FINAL NEGATIVE REPORT Performed at [facility]    Report Status PENDING  Incomplete     Studies: Dg Chest Port 1 View  02/08/2015   CLINICAL DATA:  Shortness of breath, leukocytosis, history hypertension, non ischemic cardiomyopathy, former smoker  EXAM: PORTABLE CHEST - 1 VIEW  COMPARISON:  Portable exam 1418 hours compared to 12/28/2014  FINDINGS: RIGHT jugular Port-A-Cath with tip projecting over mid SVC.  Borderline enlargement of cardiac silhouette.  Mediastinal contours and pulmonary vascularity normal.  Lungs clear.  No pleural effusion or pneumothorax.  No acute osseous findings.  IMPRESSION: No acute abnormalities.   Electronically Signed   By: Lavonia [name] M.D.   On: 02/08/2015 14:46    Scheduled Meds: . antiseptic oral rinse  7 mL Mouth Rinse BID  . insulin aspart  0-9 Units Subcutaneous TID WC  . piperacillin-tazobactam (ZOSYN)  IV  3.375 g Intravenous 3 times per day  . sodium chloride  3 mL Intravenous Q12H  . vancomycin  750 mg Intravenous Q12H   Continuous Infusions: . sodium chloride 75 mL/hr at 02/09/15  0806  . sodium chloride    . pantoprozole (PROTONIX) infusion 8 mg/hr (02/10/15 1055)       Time spent: 35 minutes    [facility] A  [facility] Pager [phone] If 7PM-7AM, please contact night-coverage at [email or web address], password [facility] 02/10/2015, 11:38 AM  LOS: 2 days

## 2015-02-11 LAB — CBC
HCT: 25.6 % — ABNORMAL LOW (ref 39.0–52.0)
Hemoglobin: 8.6 g/dL — ABNORMAL LOW (ref 13.0–17.0)
MCH: 29.6 pg (ref 26.0–34.0)
MCHC: 33.6 g/dL (ref 30.0–36.0)
MCV: 88 fL (ref 78.0–100.0)
PLATELETS: 161 10*3/uL (ref 150–400)
RBC: 2.91 MIL/uL — AB (ref 4.22–5.81)
RDW: 17.6 % — ABNORMAL HIGH (ref 11.5–15.5)
WBC: 24.7 10*3/uL — ABNORMAL HIGH (ref 4.0–10.5)

## 2015-02-11 LAB — GLUCOSE, CAPILLARY
Glucose-Capillary: 106 mg/dL — ABNORMAL HIGH (ref 70–99)
Glucose-Capillary: 137 mg/dL — ABNORMAL HIGH (ref 70–99)
Glucose-Capillary: 127 mg/dL — ABNORMAL HIGH (ref 70–99)
Glucose-Capillary: 118 mg/dL — ABNORMAL HIGH (ref 70–99)

## 2015-02-11 MED ORDER — PANTOPRAZOLE SODIUM 40 MG PO TBEC
40.0000 mg | DELAYED_RELEASE_TABLET | Freq: Two times a day (BID) | ORAL | Status: DC
  Administered 2015-02-11 – 2015-02-12 (×3): 40 mg via ORAL
  Filled 2015-02-11 (×4): qty 1

## 2015-02-11 NOTE — Progress Notes (Signed)
[facility] PROGRESS NOTE   Jason Reilly UXN:[id number] DOB: 05/05/1946 DOA: 02/08/2015 PCP: [name], MD  HPI/Subjective: Seen with daughter and wife at bedside. Tolerate full liquids without problems, last hemoglobin was 1:30 on 4/15 was 8.1. Denies any hematemesis or melena since then. Check hemoglobin, advanced regular diet and expect discharge in a.m.  Assessment/Plan: Active Problems:   Non-ischemic cardiomyopathy   Metastatic colon cancer to liver   Acute blood loss anemia   Leukocytosis   Chronic combined systolic and diastolic heart failure   Gastric ulcer with hemorrhage but without obstruction   Upper GI bleed   Absolute anemia   Acute blood loss anemia -Presented with hemoglobin of 5.1, baseline hemoglobin was 11.6 about a week ago. -This is likely secondary to upper GI bleed. -Status post transfusion of 3 units of packed RBCs, hemoglobin is 8.1, dropped to 7.7. -Discontinue aspirin -Hemoglobin currently stable, check hemoglobin now and in a.m.  Upper GI bleed -Presented with anemia, positive FOBT. -GI consulted, EGD was done 02/09/2015 and showed gastric ulcer treated with epinephrine injection. -Protonix infusion continued for 72 hours switched to Protonix twice a day.  Sepsis -Presented with leukocytosis, respiratory rate of 24 and heart rate of 1:15 consistent with sepsis. -Significant leukocytosis could be secondary to Neulasta but patient has mildly elevated lactic acid and procalcitonin. -Patient empirically on vancomycin and Zosyn, still has significant leukocytosis. -Urine and blood cultures still negative, no fever, I will discontinue IV IV antibiotics and observe overnight.  Diabetes mellitus type 2 ,uncontrolled -Hemoglobin A1c 8.2 on 12/28/2014  -Although the patient does have a metabolic acidosis this time, I believe that this may be due to the patient's hypovolemia and acute blood loss anemia  -Certainly, if the patient has ketones  in her urine, the patient will be started on intravenous insulin  -Discontinue metformin And tradjenta  Metastatic colon cancer  -The patient has metastasis to the lungs, adrenal glands, and liver  -Recently received fourth cycle of FOLFOX Neulasta support   Hypotension  -Likely due to acute blood loss anemia/volume depletion -Status post transfusion of 3 units PRBC  -Hold IV fluids.  Chronic systolic and diastolic CHF -Clinically appears volume depleted  -Monitor clinically for fluid overload -12/29/2014 echo EF 40-45 percent, grade 1 diastolic dysfunction  Transaminasemia -likely due to liver mets -stable -monitor clinically   Code Status: Full Code Family Communication: Plan discussed with the patient. Disposition Plan: Remains inpatient Diet: DIET SOFT Room service appropriate?: Yes; Fluid consistency:: Thin  Consultants:  GI  Procedures:  EGD  Antibiotics: Vancomycin and Zosyn   Objective: Filed Vitals:   02/11/15 0518  BP: 129/73  Pulse: 91  Temp: 98.3 F (36.8 C)  Resp: 18    Intake/Output Summary (Last 24 hours) at 02/11/15 1209 Last data filed at 02/10/15 1400  Gross per 24 hour  Intake    360 ml  Output      0 ml  Net    360 ml   Filed Weights   02/08/15 1530 02/09/15 0342  Weight: 58 kg (127 lb 13.9 oz) 61.9 kg (136 lb 7.4 oz)    Exam: General: Alert and awake, oriented x3, not in any acute distress. HEENT: anicteric sclera, pupils reactive to light and accommodation, EOMI CVS: S1-S2 clear, no murmur rubs or gallops Chest: clear to auscultation bilaterally, no wheezing, rales or rhonchi Abdomen: soft nontender, nondistended, normal bowel sounds, no organomegaly Extremities: no cyanosis, clubbing or edema noted bilaterally Neuro: Cranial nerves II-XII intact, no focal  neurological deficits  Data Reviewed: Basic Metabolic Panel:  Recent Labs Lab 02/08/15 0922 02/09/15 0300 02/10/15 0315  NA 133* 135 134*  K 6.0* 4.5 3.8    CL  --  106 105  CO2 17* 24 25  GLUCOSE 306* 136* 98  BUN 57.7* 34* 13  CREATININE 0.9 0.82 0.83  CALCIUM 8.8 8.1* 7.9*   Liver Function Tests:  Recent Labs Lab 02/08/15 0922 02/09/15 0300  AST 48* 44*  ALT 43 39  ALKPHOS 417* 255*  BILITOT 0.75 0.8  PROT 6.2* 5.6*  ALBUMIN 2.7* 2.6*   No results for input(s): LIPASE, AMYLASE in the last 168 hours. No results for input(s): AMMONIA in the last 168 hours. CBC:  Recent Labs Lab 02/08/15 0922  02/09/15 0300 02/09/15 1050 02/09/15 2100 02/10/15 0315 02/10/15 1145 02/10/15 1330  WBC 74.1*  < > 25.9*  --  23.9* 26.2* 23.9* 23.2*  NEUTROABS 68.1*  --   --   --   --   --   --   --   HGB 5.1*  < > 8.1* 7.7* 7.9* 8.0* 7.5* 8.1*  HCT 16.9*  < > 23.6* 22.6* 23.2* 23.4* 22.5* 23.6*  MCV 89.8  < > 85.5  --  86.2 86.7 86.5 88.1  PLT 222  < > 140*  --  130* 128* 121* 157  < > = values in this interval not displayed. Cardiac Enzymes: No results for input(s): CKTOTAL, CKMB, CKMBINDEX, TROPONINI in the last 168 hours. BNP (last 3 results) No results for input(s): BNP in the last 8760 hours.  ProBNP (last 3 results) No results for input(s): PROBNP in the last 8760 hours.  CBG:  Recent Labs Lab 02/10/15 1146 02/10/15 1721 02/10/15 2202 02/11/15 0728 02/11/15 1151  GLUCAP 141* 130* 112* 106* 137*    Micro Recent Results (from the past 240 hour(s))  Urine culture     Status: None   Collection Time: 02/08/15  2:14 PM  Result Value Ref Range Status   Specimen Description URINE, RANDOM  Final   Special Requests NONE  Final   Colony Count NO GROWTH Performed at [facility]   Final   Culture NO GROWTH Performed at [facility]   Final   Report Status 02/09/2015 FINAL  Final  Culture, blood (routine x 2)     Status: None (Preliminary result)   Collection Time: 02/08/15  2:51 PM  Result Value Ref Range Status   Specimen Description LEFT ANTECUBITAL  Final   Special Requests NONE  Final   Culture    Final           BLOOD CULTURE RECEIVED NO GROWTH TO DATE CULTURE WILL BE HELD FOR 5 DAYS BEFORE ISSUING A FINAL NEGATIVE REPORT Performed at [facility]    Report Status PENDING  Incomplete  MRSA PCR Screening     Status: None   Collection Time: 02/08/15  3:30 PM  Result Value Ref Range Status   MRSA by PCR NEGATIVE NEGATIVE Final    Comment:        The GeneXpert MRSA Assay (FDA approved for NASAL specimens only), is one component of a comprehensive MRSA colonization surveillance program. It is not intended to diagnose MRSA infection nor to guide or monitor treatment for MRSA infections.   Culture, blood (single)     Status: None (Preliminary result)   Collection Time: 02/09/15  3:44 AM  Result Value Ref Range Status   Specimen Description BLOOD LEFT ANTECUBITAL  Final   Special Requests BOTTLES DRAWN AEROBIC ONLY 5ML  Final   Culture   Final           BLOOD CULTURE RECEIVED NO GROWTH TO DATE CULTURE WILL BE HELD FOR 5 DAYS BEFORE ISSUING A FINAL NEGATIVE REPORT Performed at [facility]    Report Status PENDING  Incomplete     Studies: No results found.  Scheduled Meds: . antiseptic oral rinse  7 mL Mouth Rinse BID  . insulin aspart  0-9 Units Subcutaneous TID WC  . piperacillin-tazobactam (ZOSYN)  IV  3.375 g Intravenous 3 times per day  . sodium chloride  3 mL Intravenous Q12H  . vancomycin  750 mg Intravenous Q12H   Continuous Infusions:       Time spent: 35 minutes    [facility] A  [facility] Pager [phone] If 7PM-7AM, please contact night-coverage at [email or web address], password [facility] 02/11/2015, 12:09 PM  LOS: 3 days

## 2015-02-12 ENCOUNTER — Other Ambulatory Visit: Payer: Self-pay | Admitting: Hematology

## 2015-02-12 LAB — BASIC METABOLIC PANEL
Anion gap: 6 (ref 5–15)
BUN: 7 mg/dL (ref 6–23)
CHLORIDE: 106 mmol/L (ref 96–112)
CO2: 25 mmol/L (ref 19–32)
Calcium: 8.5 mg/dL (ref 8.4–10.5)
Creatinine, Ser: 0.87 mg/dL (ref 0.50–1.35)
GFR, EST NON AFRICAN AMERICAN: 88 mL/min — AB (ref >90)
Glucose, Bld: 124 mg/dL — ABNORMAL HIGH (ref 70–99)
POTASSIUM: 3.8 mmol/L (ref 3.5–5.1)
SODIUM: 137 mmol/L (ref 135–145)

## 2015-02-12 LAB — CBC
HEMATOCRIT: 24.7 % — AB (ref 39.0–52.0)
HEMOGLOBIN: 8.2 g/dL — AB (ref 13.0–17.0)
MCH: 29.2 pg (ref 26.0–34.0)
MCHC: 33.2 g/dL (ref 30.0–36.0)
MCV: 87.9 fL (ref 78.0–100.0)
Platelets: 166 10*3/uL (ref 150–400)
RBC: 2.81 MIL/uL — ABNORMAL LOW (ref 4.22–5.81)
RDW: 17.8 % — AB (ref 11.5–15.5)
WBC: 18.5 10*3/uL — ABNORMAL HIGH (ref 4.0–10.5)

## 2015-02-12 LAB — PROCALCITONIN: PROCALCITONIN: 0.35 ng/mL

## 2015-02-12 LAB — GLUCOSE, CAPILLARY: GLUCOSE-CAPILLARY: 138 mg/dL — AB (ref 70–99)

## 2015-02-12 MED ORDER — PANTOPRAZOLE SODIUM 40 MG PO TBEC
40.0000 mg | DELAYED_RELEASE_TABLET | Freq: Two times a day (BID) | ORAL | Status: DC
Start: 2015-02-12 — End: 2015-03-17

## 2015-02-12 MED ORDER — HEPARIN SOD (PORK) LOCK FLUSH 100 UNIT/ML IV SOLN
500.0000 [IU] | Freq: Once | INTRAVENOUS | Status: AC
  Administered 2015-02-12: 500 [IU] via INTRAVENOUS
  Filled 2015-02-12: qty 5

## 2015-02-12 NOTE — Progress Notes (Signed)
Patient discharged to home with family, discharge instructions reviewed with patient and son. New RX given to patient.

## 2015-02-12 NOTE — Discharge Summary (Signed)
Physician Discharge Summary  Jason Reilly IZT:[id number] DOB: 1946/06/08 DOA: 02/08/2015  PCP: [name], MD  Admit date: 02/08/2015 Discharge date: 02/12/2015  Time spent: 40 minutes minutes  Recommendations for Outpatient Follow-up:  1. Follow-up with primary care physician within one week. 2. Follow-up with oncology in 1 week. 3. Do not take aspirin for at least 1 week, new medication Protonix 40 mg twice a day for one month then daily.  Discharge Diagnoses:  Active Problems:   Non-ischemic cardiomyopathy   Metastatic colon cancer to liver   Acute blood loss anemia   Leukocytosis   Chronic combined systolic and diastolic heart failure   Gastric ulcer with hemorrhage but without obstruction   Upper GI bleed   Absolute anemia   Discharge Condition: Stable  Diet recommendation: Carbohydrate modified diet  Filed Weights   02/08/15 1530 02/09/15 0342  Weight: 58 kg (127 lb 13.9 oz) 61.9 kg (136 lb 7.4 oz)    History of present illness:  69 year old male with a history of metastatic colon cancer, diabetes mellitus, nonischemic cardiomyopathy, hypertension presented to the [facility] on the day of admission with generalized weakness. The patient has been complaining of 2-3 day history of nausea, vomiting, and melanotic stools. The patient last received chemo (FOLFOX) on 02/02/2015. Apparently, the patient received Neulasta support 3 days after chemo. The patient presented to the [facility] where blood work revealed hemoglobin of 5.1 with WBC 74.1. As a result, the patient was transferred to the emergency department for further evaluation. Repeat labs revealed WBC 65.9 with hemoglobin 5.1. The patient was also mildly hypotensive with a blood pressure 85/48. He complained of generalized weakness but denied any chest discomfort, shortness of breath, dizziness, syncope. He denies any hematochezia, melena, dysuria, hematuria, hematemesis, epistaxis, hemoptysis. Hemoccult  performed by emergency department physician revealed a lot of stool that was Hemoccult positive. As a result, an order was placed for 3 units PRBC and on-call GI was called by EDP. The patient was also started on a Protonix drip. Marland KitchenNotably, the patient takes aspirin 81 mg daily but denies any NSAIDs or over-the-counter supplements.  In the emergency department, the patient was initially hypotensive. This improved with 1 L normal saline with a blood pressure 116/50. The patient was mildly tachycardic with heart rate 112. Sodium was 133, potassium 2.0, BUN 57, creatinine 0.9.  Hospital Course:   Acute blood loss anemia -Presented with hemoglobin of 5.1, baseline hemoglobin was 11.6 about a week ago. -This is likely secondary to upper GI bleed. -Status post transfusion of 3 units of packed RBCs, hemoglobin is 8.1, dropped to 7.7. -On discharge as not to take aspirin for at least 1 week. -hemoglobin is 8.2 on discharge, patient is stable asymptomatic for anemia.  Upper GI bleed -Presented with anemia, positive FOBT. -GI consulted, EGD was done 02/09/2015 and showed gastric ulcer treated with epinephrine injection. -Protonix infusion continued for 72 hours switched to Protonix twice a day. -Discharged on Protonix twice a day, then switch to daily after one month  Sepsis -Presented with leukocytosis, respiratory rate of 24 and heart rate of 1:15 consistent with sepsis. -Significant leukocytosis could be secondary to Neulasta but patient has mildly elevated lactic acid and procalcitonin. -Patient empirically on vancomycin and Zosyn, still has significant leukocytosis. -Urine and blood cultures still negative, patient was afebrile since admission, antibiotics discontinued. -Was observed off of antibiotics for 24 hours, still no fever, WBC count is coming down.  Diabetes mellitus type 2 ,uncontrolled -Hemoglobin A1c  8.2 on 12/28/2014  -Although the patient does have a metabolic acidosis this  time, I believe that this may be due to the patient's hypovolemia and acute blood loss anemia -Metformin and Tradjenta held on admission, restarted on discharge.  Metastatic colon cancer  -The patient has metastasis to the lungs, adrenal glands, and liver  -Recently received fourth cycle of FOLFOX Neulasta support   Hypotension  -Likely due to acute blood loss anemia/volume depletion -Status post transfusion of 3 units PRBC, improved after the transfusion.  Chronic systolic and diastolic CHF -Clinically appears volume depleted  -Monitor clinically for fluid overload -12/29/2014 echo EF 40-45 percent, grade 1 diastolic dysfunction  Transaminasemia -likely due to liver mets -stable -monitor clinically   Procedures:  EGD: Done by Dr. Fuller Plan on 02/08/2015 showed single ulcer at the incisura, injection of epinephrine performed  Consultations:  GI  Discharge Exam: Filed Vitals:   02/12/15 0519  BP: 133/80  Pulse: 108  Temp: 98.2 F (36.8 C)  Resp: 16   General: Alert and awake, oriented x3, not in any acute distress. HEENT: anicteric sclera, pupils reactive to light and accommodation, EOMI CVS: S1-S2 clear, no murmur rubs or gallops Chest: clear to auscultation bilaterally, no wheezing, rales or rhonchi Abdomen: soft nontender, nondistended, normal bowel sounds, no organomegaly Extremities: no cyanosis, clubbing or edema noted bilaterally Neuro: Cranial nerves II-XII intact, no focal neurological deficits   Discharge Instructions   Discharge Instructions    Diet - low sodium heart healthy    Complete by:  As directed      Increase activity slowly    Complete by:  As directed           Discharge Medication List as of 02/12/2015  8:47 AM    START taking these medications   Details  pantoprazole (PROTONIX) 40 MG tablet Take 1 tablet (40 mg total) by mouth 2 (two) times daily., Starting 02/12/2015, Until Discontinued, Print      CONTINUE these medications which  have NOT CHANGED   Details  clindamycin (CLINDAGEL) 1 % gel Apply topically 2 (two) times daily., Starting 01/11/2015, Until Discontinued, Normal    hydrocortisone 2.5 % cream Apply topically 2 (two) times daily. Once daily on face and twice daily on body, Starting 01/11/2015, Until Discontinued, Normal    lidocaine-prilocaine (EMLA) cream Apply 1 application topically as needed. Apply to [facility] cath at least one hour before needle stick., Starting 11/30/2014, Until Discontinued, Normal    linagliptin (TRADJENTA) 5 MG TABS tablet Take 1 tablet (5 mg total) by mouth daily., Starting 01/02/2015, Until Discontinued, Normal    lisinopril-hydrochlorothiazide (PRINZIDE,ZESTORETIC) 10-12.5 MG per tablet Take 1 tablet by mouth daily., Starting 12/02/2014, Until Discontinued, Historical Med    loperamide (IMODIUM A-D) 2 MG tablet Take 2 mg by mouth 4 (four) times daily as needed for diarrhea or loose stools., Until Discontinued, Historical Med    metFORMIN (GLUCOPHAGE) 500 MG tablet Take by mouth 2 (two) times daily with a meal., Until Discontinued, Historical Med    mirtazapine (REMERON) 15 MG tablet Take 1 tablet (15 mg total) by mouth at bedtime., Starting 11/02/2014, Until Discontinued, Print    ondansetron (ZOFRAN) 8 MG tablet Take 1 tablet (8 mg total) by mouth every 8 (eight) hours as needed for nausea or vomiting., Starting 12/31/2014, Until Discontinued, Print      STOP taking these medications     aspirin EC 81 MG tablet        No Known Allergies Follow-up Information  Follow up with Norberto Sorenson T. Fuller Plan, MD On 03/17/2015.   Specialty:  Gastroenterology   Why:  at 9:15 am    Contact information:   [address] [place] [address] [phone]        The results of significant diagnostics from this hospitalization (including imaging, microbiology, ancillary and laboratory) are listed below for reference.    Significant Diagnostic Studies: Dg Chest Port 1 View  02/08/2015   CLINICAL  DATA:  Shortness of breath, leukocytosis, history hypertension, non ischemic cardiomyopathy, former smoker  EXAM: PORTABLE CHEST - 1 VIEW  COMPARISON:  Portable exam 1418 hours compared to 12/28/2014  FINDINGS: RIGHT jugular Port-A-Cath with tip projecting over mid SVC.  Borderline enlargement of cardiac silhouette.  Mediastinal contours and pulmonary vascularity normal.  Lungs clear.  No pleural effusion or pneumothorax.  No acute osseous findings.  IMPRESSION: No acute abnormalities.   Electronically Signed   By: Lavonia [name] M.D.   On: 02/08/2015 14:46    Microbiology: Recent Results (from the past 240 hour(s))  Urine culture     Status: None   Collection Time: 02/08/15  2:14 PM  Result Value Ref Range Status   Specimen Description URINE, RANDOM  Final   Special Requests NONE  Final   Colony Count NO GROWTH Performed at [facility]   Final   Culture NO GROWTH Performed at [facility]   Final   Report Status 02/09/2015 FINAL  Final  Culture, blood (routine x 2)     Status: None (Preliminary result)   Collection Time: 02/08/15  2:51 PM  Result Value Ref Range Status   Specimen Description LEFT ANTECUBITAL  Final   Special Requests NONE  Final   Culture   Final           BLOOD CULTURE RECEIVED NO GROWTH TO DATE CULTURE WILL BE HELD FOR 5 DAYS BEFORE ISSUING A FINAL NEGATIVE REPORT Performed at [facility]    Report Status PENDING  Incomplete  MRSA PCR Screening     Status: None   Collection Time: 02/08/15  3:30 PM  Result Value Ref Range Status   MRSA by PCR NEGATIVE NEGATIVE Final    Comment:        The GeneXpert MRSA Assay (FDA approved for NASAL specimens only), is one component of a comprehensive MRSA colonization surveillance program. It is not intended to diagnose MRSA infection nor to guide or monitor treatment for MRSA infections.   Culture, blood (single)     Status: None (Preliminary result)   Collection Time: 02/09/15  3:44 AM  Result  Value Ref Range Status   Specimen Description BLOOD LEFT ANTECUBITAL  Final   Special Requests BOTTLES DRAWN AEROBIC ONLY 5ML  Final   Culture   Final           BLOOD CULTURE RECEIVED NO GROWTH TO DATE CULTURE WILL BE HELD FOR 5 DAYS BEFORE ISSUING A FINAL NEGATIVE REPORT Performed at [facility]    Report Status PENDING  Incomplete     Labs: Basic Metabolic Panel:  Recent Labs Lab 02/08/15 0922 02/09/15 0300 02/10/15 0315 02/12/15 0520  NA 133* 135 134* 137  K 6.0* 4.5 3.8 3.8  CL  --  106 105 106  CO2 17* 24 25 25   GLUCOSE 306* 136* 98 124*  BUN 57.7* 34* 13 7  CREATININE 0.9 0.82 0.83 0.87  CALCIUM 8.8 8.1* 7.9* 8.5   Liver Function Tests:  Recent Labs Lab  02/08/15 0922 02/09/15 0300  AST 48* 44*  ALT 43 39  ALKPHOS 417* 255*  BILITOT 0.75 0.8  PROT 6.2* 5.6*  ALBUMIN 2.7* 2.6*   No results for input(s): LIPASE, AMYLASE in the last 168 hours. No results for input(s): AMMONIA in the last 168 hours. CBC:  Recent Labs Lab 02/08/15 0922  02/10/15 0315 02/10/15 1145 02/10/15 1330 02/11/15 1540 02/12/15 0520  WBC 74.1*  < > 26.2* 23.9* 23.2* 24.7* 18.5*  NEUTROABS 68.1*  --   --   --   --   --   --   HGB 5.1*  < > 8.0* 7.5* 8.1* 8.6* 8.2*  HCT 16.9*  < > 23.4* 22.5* 23.6* 25.6* 24.7*  MCV 89.8  < > 86.7 86.5 88.1 88.0 87.9  PLT 222  < > 128* 121* 157 161 166  < > = values in this interval not displayed. Cardiac Enzymes: No results for input(s): CKTOTAL, CKMB, CKMBINDEX, TROPONINI in the last 168 hours. BNP: BNP (last 3 results) No results for input(s): BNP in the last 8760 hours.  ProBNP (last 3 results) No results for input(s): PROBNP in the last 8760 hours.  CBG:  Recent Labs Lab 02/11/15 0728 02/11/15 1151 02/11/15 1729 02/11/15 2145 02/12/15 0737  GLUCAP 106* 137* 127* 118* 138*       Signed:  Marji Kuehnel A  [facility] 02/12/2015, 10:00 AM

## 2015-02-14 LAB — CULTURE, BLOOD (ROUTINE X 2): CULTURE: NO GROWTH

## 2015-02-15 LAB — CULTURE, BLOOD (SINGLE): Culture: NO GROWTH

## 2015-02-16 ENCOUNTER — Encounter: Payer: Self-pay | Admitting: Hematology

## 2015-02-16 ENCOUNTER — Ambulatory Visit (HOSPITAL_BASED_OUTPATIENT_CLINIC_OR_DEPARTMENT_OTHER): Payer: Medicare Other | Admitting: Hematology

## 2015-02-16 ENCOUNTER — Ambulatory Visit: Payer: Medicare Other

## 2015-02-16 ENCOUNTER — Other Ambulatory Visit (HOSPITAL_BASED_OUTPATIENT_CLINIC_OR_DEPARTMENT_OTHER): Payer: Medicare Other

## 2015-02-16 VITALS — BP 140/76 | HR 94 | Temp 99.0°F | Resp 18 | Ht 65.0 in | Wt 138.2 lb

## 2015-02-16 DIAGNOSIS — C189 Malignant neoplasm of colon, unspecified: Secondary | ICD-10-CM

## 2015-02-16 DIAGNOSIS — C786 Secondary malignant neoplasm of retroperitoneum and peritoneum: Secondary | ICD-10-CM

## 2015-02-16 DIAGNOSIS — C187 Malignant neoplasm of sigmoid colon: Secondary | ICD-10-CM

## 2015-02-16 DIAGNOSIS — E119 Type 2 diabetes mellitus without complications: Secondary | ICD-10-CM | POA: Diagnosis not present

## 2015-02-16 DIAGNOSIS — C787 Secondary malignant neoplasm of liver and intrahepatic bile duct: Secondary | ICD-10-CM

## 2015-02-16 DIAGNOSIS — C7972 Secondary malignant neoplasm of left adrenal gland: Secondary | ICD-10-CM

## 2015-02-16 DIAGNOSIS — C7800 Secondary malignant neoplasm of unspecified lung: Secondary | ICD-10-CM

## 2015-02-16 LAB — COMPREHENSIVE METABOLIC PANEL (CC13)
ALT: 34 U/L (ref 0–55)
ANION GAP: 10 meq/L (ref 3–11)
AST: 45 U/L — ABNORMAL HIGH (ref 5–34)
Albumin: 2.7 g/dL — ABNORMAL LOW (ref 3.5–5.0)
Alkaline Phosphatase: 288 U/L — ABNORMAL HIGH (ref 40–150)
BUN: 8.1 mg/dL (ref 7.0–26.0)
CALCIUM: 8.4 mg/dL (ref 8.4–10.4)
CO2: 23 mEq/L (ref 22–29)
Chloride: 108 mEq/L (ref 98–109)
Creatinine: 0.7 mg/dL (ref 0.7–1.3)
Glucose: 110 mg/dl (ref 70–140)
Potassium: 4.3 mEq/L (ref 3.5–5.1)
SODIUM: 141 meq/L (ref 136–145)
Total Bilirubin: 0.42 mg/dL (ref 0.20–1.20)
Total Protein: 7.1 g/dL (ref 6.4–8.3)

## 2015-02-16 LAB — CBC WITH DIFFERENTIAL/PLATELET
BASO%: 0.2 % (ref 0.0–2.0)
Basophils Absolute: 0 10*3/uL (ref 0.0–0.1)
EOS ABS: 0.3 10*3/uL (ref 0.0–0.5)
EOS%: 2.4 % (ref 0.0–7.0)
HCT: 28.8 % — ABNORMAL LOW (ref 38.4–49.9)
HGB: 9.1 g/dL — ABNORMAL LOW (ref 13.0–17.1)
LYMPH%: 13 % — AB (ref 14.0–49.0)
MCH: 28.3 pg (ref 27.2–33.4)
MCHC: 31.6 g/dL — ABNORMAL LOW (ref 32.0–36.0)
MCV: 89.4 fL (ref 79.3–98.0)
MONO#: 0.9 10*3/uL (ref 0.1–0.9)
MONO%: 7.2 % (ref 0.0–14.0)
NEUT#: 9.8 10*3/uL — ABNORMAL HIGH (ref 1.5–6.5)
NEUT%: 77.2 % — ABNORMAL HIGH (ref 39.0–75.0)
PLATELETS: 170 10*3/uL (ref 140–400)
RBC: 3.22 10*6/uL — AB (ref 4.20–5.82)
RDW: 17.6 % — ABNORMAL HIGH (ref 11.0–14.6)
WBC: 12.7 10*3/uL — ABNORMAL HIGH (ref 4.0–10.3)
lymph#: 1.7 10*3/uL (ref 0.9–3.3)

## 2015-02-16 LAB — HOLD TUBE, BLOOD BANK

## 2015-02-16 NOTE — Progress Notes (Signed)
[place]  [phone] Fax:([phone]) [place] Note   Patient Care Team: [name], MD as PCP - General (Specialist) [name], CRNP as Nurse Practitioner (Nurse Practitioner) Truitt Merle, MD as Consulting Physician (Hematology) 02/16/2015  CHIEF COMPLAINTS Metastatic colon cancer Oncology History   He is a Metastatic colon cancer to liver   Staging form: Colon and Rectum, AJCC 7th Edition     Clinical: Stage Unknown ([place], NX, M1) - Unsigned  Our health is     Metastatic colon cancer to liver   10/28/2014 Tumor Marker AFP 3.2 CEA > 10,000 CA 19-9 12,929.6. tumor (-) KRAS and NRAS mutation.    11/10/2014 Imaging PET scan showed hypermetabolic mass in the sigmoid colon was noted metastasis in the retroperitoneum. Probable left adrenal and pulmonary metastasis, and diffuse liver metastasis.   11/21/2014 Initial Diagnosis Metastatic colon cancer to liver, lung, abd nodes and left adrenal gland. Diagnosis was made by liver biopsy.    11/30/2014 -  Chemotherapy First line chemo [id number], Panitumumab added from second cycle    12/28/2014 - 12/31/2014 Hospital Admission Was admitted for dehydration, neutropenia fever with UTI, and severe skin rash.   02/08/2015 - 02/12/2015 Hospital Admission He was admitted for upper GI bleeding, e.g. showed a gastric ulcer with clots, status post appendectomy injection. He also received a blood transfusion.     CURRENT THERAPY: mFOLFOX6 started on 11/30/2014, Panitumumab added to second cycle on 12/14/14, 50% dose reduction with cycle 4, then stopped due to possible AEs  HISTORY OF PRESENTING ILLNESS:  Jason Reilly 69 y.o. male is here because of abnormal CT findings, which is very suspicious for malignancy. He is on ranitidine from [place], has been on in the [place] for 16 years. He came in with his son and an interpreter.  He has been feeling fatigued since two month ago. He is still able to do all ADLs. He otherwise  denies any pain, bloating or nausea.  He lost about 20lbs in 3 month. His appetite is lower than before, eats less, no change of his bowl habits.  She denied any hematochezia or melana. Per his son, he has had some personality changes daily, irritable, slightly confused some time.  He was evaluated by his primary care physician. Lab test reviewed hepatitis B infection, which he did not know before, and elevated alkaline phosphatase, his liver function and the rest of the liver function was not remarkable. [place] of abdomen was obtained on 07/22/2014, which showed diffusely abnormal liver with multiple echogenic lesions. CT of abdomen with and without contrast was done on 08/26/2014, which reviewed here at a medically with multiple large partially calcified hepatic masses consistent with metastatic disease. Mild retroperitoneal adenopathy with the largest node measuring 1.6 cm. And nonspecific 1.4 cm left adrenal nodule was also noticed. His tumor marker showed CEA greater than 10,000, CA 19-9 12,929, AFP 3.2 (normal). He was referred to [facility] and was evaluated by nurse practitioner [name]. Treatment for hepatitis B was not recommended based on his virus load.  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 He was evaluated by a cardiologist in 2014. He denies any significant dyspnea on exertion. No leg swollen.  INTERIM HISTORY: Jason Reilly returns for follow-up after recent hospitalization for GI bleeding from gastric ulcer. He received a blood transfusion. He feels better overall, but still has moderate fatigue, no abdominal pain or bloating, his appetite is still low,  eats small meals.  He denied any bleeding episodes including hematochezia, melana, hemoptysis, hematuria or epitaxis. He lost about 2 lbs in the past two weeks. No other new complains. No significant rashes after the prior cycle chemo.    MEDICAL HISTORY:  Past Medical  History  Diagnosis Date  . Tachycardia   . Abnormal EKG   . Hypertension   . Non-ischemic cardiomyopathy   . Hepatitis B     SURGICAL HISTORY: Past Surgical History  Procedure Laterality Date  . Nuclear stress test  03/03/2013    High risk - consistent with nonischemic cardiomyopathy  . Left and right heart catheterization with coronary angiogram N/A 03/29/2013    Procedure: LEFT AND RIGHT HEART CATHETERIZATION WITH CORONARY ANGIOGRAM;  Surgeon: Kenneth C. Hilty, MD;  Location: [facility] CATH [facility];  Service: Cardiovascular;  Laterality: N/A;  . Esophagogastroduodenoscopy N/A 02/08/2015    Procedure: ESOPHAGOGASTRODUODENOSCOPY (EGD);  Surgeon: Malcolm T Stark, MD;  Location: [facility] ENDOS[facility];  Service: Endoscopy;  Laterality: N/A;    SOCIAL HISTORY: History   Social History  . Marital Status: Married    Spouse Name: N/A  . Number of Children: N/A  . Years of Education: N/A   Occupational History  . Not on file.   Social History Main Topics  . Smoking status: Former Smoker  . Smokeless tobacco: Never Used  . Alcohol Use: Yes     Comment: occasional  . Drug Use: No  . Sexual Activity: No   Other Topics Concern  . Not on file   Social History Narrative    FAMILY HISTORY: No family history of liver disease or malignancy.  ALLERGIES:  has No Known Allergies.  MEDICATIONS:  Current Outpatient Prescriptions on File Prior to Visit  Medication Sig Dispense Refill  . hydrocortisone 2.5 % cream Apply topically 2 (two) times daily. Once daily on face and twice daily on body 30 g 2  . lidocaine-prilocaine (EMLA) cream Apply 1 application topically as needed. Apply to Porta cath at least one hour before needle stick. 30 g 2  . linagliptin (TRADJENTA) 5 MG TABS tablet Take 1 tablet (5 mg total) by mouth daily. 30 tablet 1  . lisinopril-hydrochlorothiazide (PRINZIDE,ZESTORETIC) 10-12.5 MG per tablet Take 1 tablet by mouth daily.  3  . metFORMIN (GLUCOPHAGE) 500 MG tablet Take by mouth 2 (two)  times daily with a meal.    . mirtazapine (REMERON) 15 MG tablet Take 1 tablet (15 mg total) by mouth at bedtime. 30 tablet 2  . clindamycin (CLINDAGEL) 1 % gel Apply topically 2 (two) times daily. (Patient not taking: Reported on 02/16/2015) 30 g 2  . loperamide (IMODIUM A-D) 2 MG tablet Take 2 mg by mouth 4 (four) times daily as needed for diarrhea or loose stools.    . ondansetron (ZOFRAN) 8 MG tablet Take 1 tablet (8 mg total) by mouth every 8 (eight) hours as needed for nausea or vomiting. (Patient not taking: Reported on 02/16/2015) 45 tablet 0  . pantoprazole (PROTONIX) 40 MG tablet Take 1 tablet (40 mg total) by mouth 2 (two) times daily. 60 tablet 0   No current facility-administered medications on file prior to visit.  ;   REVIEW OF SYSTEMS:   Constitutional: Denies fevers, chills or abnormal night sweats, (+) fatigue  Eyes: Denies blurriness of vision, double vision or watery eyes Ears, nose, mouth, throat, and face: Denies mucositis or sore throat Respiratory: Denies cough, dyspnea or wheezes Cardiovascular: Denies palpitation, chest discomfort or lower extremity swelling Gastrointestinal: (+)   anorexia.   Denies nausea, heartburn or change in bowel habits Skin: Denies abnormal skin rashes Lymphatics: Denies new lymphadenopathy or easy bruising Neurological:Denies numbness, tingling or new weaknesses Behavioral/Psych: Mood is stable, no new changes, (+) insomnia All other systems were reviewed with the patient and are negative.  PHYSICAL EXAMINATION: BP 140/76 mmHg  Pulse 94  Temp(Src) 99 F (37.2 C) (Oral)  Resp 18  Ht 5' 5" (1.651 m)  Wt 138 lb 3.2 oz (62.687 kg)  BMI 23.00 kg/m2  SpO2 100%  ECOG PERFORMANCE STATUS: 3 Vital sign were taken at in the infusion room, within normal limits. GENERAL:alert, no distress and comfortable SKIN: (+) Dry skin, scatter acne like skin rash es on the face, neck and trunk, better than last time, no skin ulcer or discharge. EYES:  normal, conjunctiva are pink and non-injected, sclera clear OROPHARYNX:no exudate, no erythema and lips, buccal mucosa, and tongue normal  NECK: supple, thyroid normal size, non-tender, without nodularity LYMPH:  no palpable lymphadenopathy in the cervical, axillary or inguinal LUNGS: clear to auscultation and percussion with normal breathing effort HEART: regular rate & rhythm and no murmurs and no lower extremity edema ABDOMEN:abdomen soft, non-tender, no hepatomegaly, no splenomegaly and normal bowel sounds Musculoskeletal:no cyanosis of digits and no clubbing  PSYCH: alert & oriented x 3 with fluent speech NEURO: no focal motor/sensory deficits  LABORATORY DATA:  I have reviewed the data as listed Lab Results  Component Value Date   WBC 12.7* 02/16/2015   HGB 9.1* 02/16/2015   HCT 28.8* 02/16/2015   MCV 89.4 02/16/2015   PLT 170 02/16/2015    Recent Labs  02/08/15 0922 02/09/15 0300 02/10/15 0315 02/12/15 0520 02/16/15 0830  NA 133* 135 134* 137 141  K 6.0* 4.5 3.8 3.8 4.3  CL  --  106 105 106  --   CO2 17* 24 25 25 23  GLUCOSE 306* 136* 98 124* 110  BUN 57.7* 34* 13 7 8.1  CREATININE 0.9 0.82 0.83 0.87 0.7  CALCIUM 8.8 8.1* 7.9* 8.5 8.4  GFRNONAA  --  >90 90* 88*  --   GFRAA  --  >90 >90 >90  --   PROT 6.2* 5.6*  --   --  7.1  ALBUMIN 2.7* 2.6*  --   --  2.7*  AST 48* 44*  --   --  45*  ALT 43 39  --   --  34  ALKPHOS 417* 255*  --   --  288*  BILITOT 0.75 0.8  --   --  0.42   INITIAL tumor markers AFP 3.2 CEA > 10,000 CA 19-9 12,929.6  Results for Brislin, Tracie (MRN [id number]) as of 02/16/2015 10:36  Ref. Range 01/11/2015 09:51 02/02/2015 08:35  CA 19-9 Latest Ref Range: <35.0 U/mL  1296.5 (H)  CEA Latest Ref Range: 0.0-5.0 ng/mL 1809.2 (H) 1327.9 (H)      Pathology report  Liver, needle/core biopsy - METASTATIC ADENOCARCINOMA, SEE COMMENT. Microscopic Comment The adenocarcinoma demonstrates the following immunophenotype: Cytokeratin 7 - negative  expression. Cytokeratin 20 - strong diffuse expression. CD2 - strong diffuse expression. Overall the morphology and immunophenotype are that of metastatic adenocarcinoma primary to colorectum. The recent nuclear medicine scan demonstrating sigmoid mass with associated liver masses is noted.      RADIOGRAPHIC STUDIES: I have personally reviewed the outside CT scan image with patient and his son.   PET 11/10/2014 IMPRESSION: 1. Sigmoid colon carcinoma (likely a mucinous adenocarcinoma based on calcified metastasis) with nodal   metastasis in the retroperitoneum. Probable left adrenal and pulmonary metastasis. 2. Widespread hepatic metastasis. Favor secondary to colon primary. The liver does have a somewhat atypical morphology, favored to be related to the extent of metastatic disease. Cirrhosis with synchronous multifocal hepatocellular carcinoma felt less likely.   ASSESSMENT & PLAN:  68-year-old [place] male, with past history of hypertension and dilated nonischemic gammopathy with EF 25%, no clinical signs of heart failure, who was found to have hepatitis B infection lately, and multiple liver lesions on the CT scan. He has extremely high CEA and CA 19-9 levels. PET scan reviewed a hypermetabolic sigmoid colon mass, diffuse liver metastasis, probable lung and adrenal gland metastasis.  1. Metastatic sigmoid colon cancer, with diffuse liver, lungs, node and left adrenal gland metastases. KRAS/NRAS wild type, MSI-stable -Liver biopsy showed metastatic adenocarcinoma. His tumor were strongly positive for CK20 and CD2, consistent with primary colorectal primary. KRAS and NRAS mutations were not detected.  -Pt understands that this is incurable cancer, and he has very high disease burden and overall prognosis is poor. The treatment goal is palliative -His bilirubinemia has improved after first cycle chemotherapy.  -Giving the wild-type KRAS/NRAS genotype, he would benefit from EGFR targeted  therapy. He started panitumumab from second cycle chemo. He had G2-3 skin rash with neutropenic fever, and was admitted for GI bleeding after second dose panitumumab. Will hold on it for now. May consider cetuximab with second line chemo. -Avastin contraindicated due to her gastric ulcer and bleeding in April 2016.  -Due to her recent gastric ulcer and significant bleeding, I'll hold chemotherapy for 4 weeks, and let the ulcer heal well. -Reschedule his restaging CT scan in 2 weeks.  2. Gastric ulcer with significant GI bleeding in April 2016 -He is on PPI. -He will follow-up with Dr. Stark in May.   3. Newly diagnosed diabetes -HbA1c was 8.5 -Continue medication, follow up with primary care physician.  4. HTN, Dilated nonischemic ischemia cardiomyopathy with EF 25% -He is clinically doing well without symptoms of CHF. However this is probably going to impact his chemotherapy. I'll try to avoid cardiotoxic chemotherapy agent and avoid fluid overload during chemotherapy. -Continue follow-up with cardiology.  5 Hepatitis B carrier -Per liver clinic, no need for treatment. Follow-up with liver clinic.  7. Malnutrition -I encouraged him to eat more, and take supplements as needed. -follow up with Dietitian   Plan -Hold chemotherapy for now -Restaging CT chest abdomen and pelvis in 2 weeks -Return to clinic in 2 weeks  All questions were answered. The patient knows to call the clinic with any problems, questions or concerns.  I spent 25 minutes counseling the patient face to face. The total time spent in the appointment was 30 minutes and more than 50% was on counseling.     Feng, Yan, MD 02/16/2015   10:34 AM 

## 2015-02-19 ENCOUNTER — Other Ambulatory Visit: Payer: Self-pay | Admitting: Hematology

## 2015-02-20 ENCOUNTER — Other Ambulatory Visit: Payer: Self-pay

## 2015-02-20 LAB — HELICOBACTER PYLORI ABS-IGG+IGA, BLD
H Pylori IgA: 13.7
H Pylori IgG: 1.9

## 2015-02-20 MED ORDER — BIS SUBCIT-METRONID-TETRACYC 140-125-125 MG PO CAPS: 3.0000 | ORAL_CAPSULE | Freq: Three times a day (TID) | ORAL | Status: DC

## 2015-02-21 ENCOUNTER — Other Ambulatory Visit: Payer: Self-pay | Admitting: *Deleted

## 2015-02-21 ENCOUNTER — Telehealth: Payer: Self-pay | Admitting: Gastroenterology

## 2015-02-21 DIAGNOSIS — C787 Secondary malignant neoplasm of liver and intrahepatic bile duct: Principal | ICD-10-CM

## 2015-02-21 DIAGNOSIS — C189 Malignant neoplasm of colon, unspecified: Secondary | ICD-10-CM

## 2015-02-21 MED ORDER — DOXYCYCLINE HYCLATE 100 MG PO CAPS: 100.0000 mg | ORAL_CAPSULE | Freq: Two times a day (BID) | ORAL | Status: DC

## 2015-02-21 MED ORDER — MIRTAZAPINE 15 MG PO TABS: 15.0000 mg | ORAL_TABLET | Freq: Every day | ORAL | Status: DC

## 2015-02-21 MED ORDER — METRONIDAZOLE 250 MG PO TABS: 250.0000 mg | ORAL_TABLET | Freq: Two times a day (BID) | ORAL | Status: DC

## 2015-02-21 NOTE — Telephone Encounter (Signed)
Informed [facility] to cancel the [place] and send in an alternative medications that should be cheaper for the patient. Called patient and spoke with patient's son and informed him that we sent in another medication that should be cheaper for him. He asked if it would be cheaper at [facility] and I told him that if he wants medication sent to another pharmacy then to call back. Pt's son verbalized understanding.

## 2015-02-22 ENCOUNTER — Other Ambulatory Visit: Payer: Medicare Other

## 2015-02-28 ENCOUNTER — Other Ambulatory Visit (HOSPITAL_BASED_OUTPATIENT_CLINIC_OR_DEPARTMENT_OTHER): Payer: Medicare Other

## 2015-02-28 DIAGNOSIS — C187 Malignant neoplasm of sigmoid colon: Secondary | ICD-10-CM | POA: Diagnosis present

## 2015-02-28 DIAGNOSIS — E119 Type 2 diabetes mellitus without complications: Secondary | ICD-10-CM | POA: Diagnosis not present

## 2015-02-28 DIAGNOSIS — C787 Secondary malignant neoplasm of liver and intrahepatic bile duct: Secondary | ICD-10-CM | POA: Diagnosis not present

## 2015-02-28 DIAGNOSIS — C189 Malignant neoplasm of colon, unspecified: Secondary | ICD-10-CM

## 2015-02-28 LAB — CBC WITH DIFFERENTIAL/PLATELET
BASO%: 0.8 % (ref 0.0–2.0)
Basophils Absolute: 0 10*3/uL (ref 0.0–0.1)
EOS%: 3.2 % (ref 0.0–7.0)
Eosinophils Absolute: 0.2 10*3/uL (ref 0.0–0.5)
HEMATOCRIT: 28.6 % — AB (ref 38.4–49.9)
HGB: 9.2 g/dL — ABNORMAL LOW (ref 13.0–17.1)
LYMPH#: 1.1 10*3/uL (ref 0.9–3.3)
LYMPH%: 22.2 % (ref 14.0–49.0)
MCH: 27.8 pg (ref 27.2–33.4)
MCHC: 32 g/dL (ref 32.0–36.0)
MCV: 86.8 fL (ref 79.3–98.0)
MONO#: 0.6 10*3/uL (ref 0.1–0.9)
MONO%: 11.4 % (ref 0.0–14.0)
NEUT#: 3.2 10*3/uL (ref 1.5–6.5)
NEUT%: 62.4 % (ref 39.0–75.0)
Platelets: 214 10*3/uL (ref 140–400)
RBC: 3.3 10*6/uL — AB (ref 4.20–5.82)
RDW: 16.8 % — ABNORMAL HIGH (ref 11.0–14.6)
WBC: 5.1 10*3/uL (ref 4.0–10.3)

## 2015-02-28 LAB — COMPREHENSIVE METABOLIC PANEL (CC13)
ALBUMIN: 3.2 g/dL — AB (ref 3.5–5.0)
ALT: 48 U/L (ref 0–55)
AST: 68 U/L — AB (ref 5–34)
Alkaline Phosphatase: 270 U/L — ABNORMAL HIGH (ref 40–150)
Anion Gap: 9 mEq/L (ref 3–11)
BUN: 9.8 mg/dL (ref 7.0–26.0)
CHLORIDE: 104 meq/L (ref 98–109)
CO2: 25 mEq/L (ref 22–29)
Calcium: 9.7 mg/dL (ref 8.4–10.4)
Creatinine: 0.9 mg/dL (ref 0.7–1.3)
EGFR: 85 mL/min/{1.73_m2} — ABNORMAL LOW (ref >90)
Glucose: 111 mg/dl (ref 70–140)
POTASSIUM: 4.4 meq/L (ref 3.5–5.1)
Sodium: 138 mEq/L (ref 136–145)
TOTAL PROTEIN: 8.5 g/dL — AB (ref 6.4–8.3)
Total Bilirubin: 0.54 mg/dL (ref 0.20–1.20)

## 2015-02-28 LAB — CEA: CEA: 694.4 ng/mL — ABNORMAL HIGH (ref 0.0–5.0)

## 2015-02-28 LAB — CANCER ANTIGEN 19-9: CA 19-9: 462.4 U/mL — ABNORMAL HIGH (ref <35.0)

## 2015-03-02 ENCOUNTER — Telehealth: Payer: Self-pay | Admitting: Hematology

## 2015-03-02 ENCOUNTER — Ambulatory Visit (HOSPITAL_BASED_OUTPATIENT_CLINIC_OR_DEPARTMENT_OTHER): Payer: Medicare Other | Admitting: Hematology

## 2015-03-02 VITALS — BP 148/75 | HR 85 | Temp 98.1°F | Resp 18 | Ht 65.0 in | Wt 139.9 lb

## 2015-03-02 DIAGNOSIS — C7800 Secondary malignant neoplasm of unspecified lung: Secondary | ICD-10-CM

## 2015-03-02 DIAGNOSIS — C7972 Secondary malignant neoplasm of left adrenal gland: Secondary | ICD-10-CM | POA: Diagnosis not present

## 2015-03-02 DIAGNOSIS — C189 Malignant neoplasm of colon, unspecified: Secondary | ICD-10-CM

## 2015-03-02 DIAGNOSIS — C787 Secondary malignant neoplasm of liver and intrahepatic bile duct: Secondary | ICD-10-CM

## 2015-03-02 DIAGNOSIS — C187 Malignant neoplasm of sigmoid colon: Secondary | ICD-10-CM

## 2015-03-02 DIAGNOSIS — E119 Type 2 diabetes mellitus without complications: Secondary | ICD-10-CM | POA: Diagnosis not present

## 2015-03-02 NOTE — Progress Notes (Signed)
[place]  [phone] Fax:([phone]) [place] Note   Patient Care Team: [name], MD as PCP - General (Specialist) [name], CRNP as Nurse Practitioner (Nurse Practitioner) Truitt Merle, MD as Consulting Physician (Hematology) 03/02/2015  CHIEF COMPLAINTS Metastatic colon cancer Oncology History   He is a Metastatic colon cancer to liver   Staging form: Colon and Rectum, AJCC 7th Edition     Clinical: Stage Unknown ([place], NX, M1) - Unsigned  Our health is     Metastatic colon cancer to liver   10/28/2014 Tumor Marker AFP 3.2 CEA > 10,000 CA 19-9 12,929.6. tumor (-) KRAS and NRAS mutation.    11/10/2014 Imaging PET scan showed hypermetabolic mass in the sigmoid colon was noted metastasis in the retroperitoneum. Probable left adrenal and pulmonary metastasis, and diffuse liver metastasis.   11/21/2014 Initial Diagnosis Metastatic colon cancer to liver, lung, abd nodes and left adrenal gland. Diagnosis was made by liver biopsy.    11/30/2014 -  Chemotherapy First line chemo mFOLFOX6, Panitumumab added from second cycle    12/28/2014 - 12/31/2014 Hospital Admission Was admitted for dehydration, neutropenia fever with UTI, and severe skin rash.   02/08/2015 - 02/12/2015 Hospital Admission He was admitted for upper GI bleeding, e.g. showed a gastric ulcer with clots, status post appendectomy injection. He also received a blood transfusion.     CURRENT THERAPY: mFOLFOX6 started on 11/30/2014, Panitumumab added to second cycle on 12/14/14, 50% dose reduction with cycle 4, then stopped due to possible AEs  HISTORY OF PRESENTING ILLNESS:  Jason Reilly 69 y.o. male is here because of abnormal CT findings, which is very suspicious for malignancy. He is on ranitidine from [place], has been on in the [place] for 16 years. He came in with his son and an interpreter.  He has been feeling fatigued since two month ago. He is still able to do all ADLs. He otherwise  denies any pain, bloating or nausea.  He lost about 20lbs in 3 month. His appetite is lower than before, eats less, no change of his bowl habits.  She denied any hematochezia or melana. Per his son, he has had some personality changes daily, irritable, slightly confused some time.  He was evaluated by his primary care physician. Lab test reviewed hepatitis B infection, which he did not know before, and elevated alkaline phosphatase, his liver function and the rest of the liver function was not remarkable. [place] of abdomen was obtained on 07/22/2014, which showed diffusely abnormal liver with multiple echogenic lesions. CT of abdomen with and without contrast was done on 08/26/2014, which reviewed here at a medically with multiple large partially calcified hepatic masses consistent with metastatic disease. Mild retroperitoneal adenopathy with the largest node measuring 1.6 cm. And nonspecific 1.4 cm left adrenal nodule was also noticed. His tumor marker showed CEA greater than 10,000, CA 19-9 12,929, AFP 3.2 (normal). He was referred to [facility] and was evaluated by nurse practitioner [name]. Treatment for hepatitis B was not recommended based on his virus load.  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 He was evaluated by a cardiologist in 2014. He denies any significant dyspnea on exertion. No leg swollen.  INTERIM HISTORY: Jason Reilly returns for follow-up. He is clinically doing well. He denies any bleeding, no melena or hematochezia. He has good appetite and eating well. He gained a few lbs back. He denies any pain, or other symptoms.  MEDICAL HISTORY:  Past Medical History  Diagnosis Date  . Tachycardia   . Abnormal EKG   . Hypertension   . Non-ischemic cardiomyopathy   . Hepatitis B     SURGICAL HISTORY: Past Surgical History  Procedure Laterality Date  . Nuclear stress test  03/03/2013    High risk - consistent with  nonischemic cardiomyopathy  . Left and right heart catheterization with coronary angiogram N/A 03/29/2013    Procedure: LEFT AND RIGHT HEART CATHETERIZATION WITH CORONARY ANGIOGRAM;  Surgeon: Pixie Casino, MD;  Location: [facility] CATH LAB;  Service: Cardiovascular;  Laterality: N/A;  . Esophagogastroduodenoscopy N/A 02/08/2015    Procedure: ESOPHAGOGASTRODUODENOSCOPY (EGD);  Surgeon: Ladene Artist, MD;  Location: [facility] ENDOSCOPY;  Service: Endoscopy;  Laterality: N/A;    SOCIAL HISTORY: History   Social History  . Marital Status: Married    Spouse Name: N/A  . Number of Children: N/A  . Years of Education: N/A   Occupational History  . Not on file.   Social History Main Topics  . Smoking status: Former Research scientist (life sciences)  . Smokeless tobacco: Never Used  . Alcohol Use: Yes     Comment: occasional  . Drug Use: No  . Sexual Activity: No   Other Topics Concern  . Not on file   Social History Narrative    FAMILY HISTORY: No family history of liver disease or malignancy.  ALLERGIES:  has No Known Allergies.  MEDICATIONS:  Current Outpatient Prescriptions on File Prior to Visit  Medication Sig Dispense Refill  . clindamycin (CLINDAGEL) 1 % gel Apply topically 2 (two) times daily. 30 g 2  . doxycycline (VIBRAMYCIN) 100 MG capsule Take 1 capsule (100 mg total) by mouth 2 (two) times daily. 20 capsule 0  . hydrocortisone 2.5 % cream Apply topically 2 (two) times daily. Once daily on face and twice daily on body 30 g 2  . lidocaine-prilocaine (EMLA) cream Apply 1 application topically as needed. Apply to [facility] cath at least one hour before needle stick. 30 g 2  . linagliptin (TRADJENTA) 5 MG TABS tablet Take 1 tablet (5 mg total) by mouth daily. 30 tablet 1  . lisinopril-hydrochlorothiazide (PRINZIDE,ZESTORETIC) 10-12.5 MG per tablet Take 1 tablet by mouth daily.  3  . loperamide (IMODIUM A-D) 2 MG tablet Take 2 mg by mouth 4 (four) times daily as needed for diarrhea or loose stools.    .  metFORMIN (GLUCOPHAGE) 500 MG tablet Take by mouth 2 (two) times daily with a meal.    . metroNIDAZOLE (FLAGYL) 250 MG tablet Take 1 tablet (250 mg total) by mouth 2 (two) times daily. 20 tablet 0  . mirtazapine (REMERON) 15 MG tablet Take 1 tablet (15 mg total) by mouth at bedtime. 30 tablet 2  . ondansetron (ZOFRAN) 8 MG tablet Take 1 tablet (8 mg total) by mouth every 8 (eight) hours as needed for nausea or vomiting. 45 tablet 0  . pantoprazole (PROTONIX) 40 MG tablet Take 1 tablet (40 mg total) by mouth 2 (two) times daily. 60 tablet 0   No current facility-administered medications on file prior to visit.  ;   REVIEW OF SYSTEMS:   Constitutional: Denies fevers, chills or abnormal night sweats, (+) fatigue  Eyes: Denies blurriness of vision, double vision or watery eyes Ears, nose, mouth, throat, and face: Denies mucositis or sore throat Respiratory: Denies cough, dyspnea or wheezes Cardiovascular: Denies palpitation, chest discomfort or lower extremity swelling Gastrointestinal: (+) anorexia.   Denies nausea, heartburn or change  in bowel habits Skin: Denies abnormal skin rashes Lymphatics: Denies new lymphadenopathy or easy bruising Neurological:Denies numbness, tingling or new weaknesses Behavioral/Psych: Mood is stable, no new changes, (+) insomnia All other systems were reviewed with the patient and are negative.  PHYSICAL EXAMINATION: BP 148/75 mmHg  Pulse 85  Temp(Src) 98.1 F (36.7 C) (Oral)  Resp 18  Ht _0  (1.651 m)  Wt 139 lb 14.4 oz (63.458 kg)  BMI 23.28 kg/m2  SpO2 100%  ECOG PERFORMANCE STATUS: 3 Vital sign were taken at in the infusion room, within normal limits. GENERAL:alert, no distress and comfortable SKIN: (+) Dry skin, scatter acne like skin rash es on the face, neck and trunk, better than last time, no skin ulcer or discharge. EYES: normal, conjunctiva are pink and non-injected, sclera clear OROPHARYNX:no exudate, no erythema and lips, buccal mucosa,  and tongue normal  NECK: supple, thyroid normal size, non-tender, without nodularity LYMPH:  no palpable lymphadenopathy in the cervical, axillary or inguinal LUNGS: clear to auscultation and percussion with normal breathing effort HEART: regular rate & rhythm and no murmurs and no lower extremity edema ABDOMEN:abdomen soft, non-tender, no hepatomegaly, no splenomegaly and normal bowel sounds Musculoskeletal:no cyanosis of digits and no clubbing  PSYCH: alert & oriented x 3 with fluent speech NEURO: no focal motor/sensory deficits  LABORATORY DATA:  I have reviewed the data as listed CBC Latest Ref Rng 02/28/2015 02/16/2015 02/12/2015  WBC 4.0 - 10.3 10e3/uL 5.1 12.7(H) 18.5(H)  Hemoglobin 13.0 - 17.1 g/dL 9.2(L) 9.1(L) 8.2(L)  Hematocrit 38.4 - 49.9 % 28.6(L) 28.8(L) 24.7(L)  Platelets 140 - 400 10e3/uL 214 170 166    CMP Latest Ref Rng 02/28/2015 02/16/2015 02/12/2015  Glucose 70 - 140 mg/dl 111 110 124(H)  BUN 7.0 - 26.0 mg/dL 9.8 8.1 7  Creatinine 0.7 - 1.3 mg/dL 0.9 0.7 0.87  Sodium 136 - 145 mEq/L 138 141 137  Potassium 3.5 - 5.1 mEq/L 4.4 4.3 3.8  Chloride 96 - 112 mmol/L - - 106  CO2 22 - 29 mEq/L _1 Calcium 8.4 - 10.4 mg/dL 9.7 8.4 8.5  Total Protein 6.4 - 8.3 g/dL 8.5(H) 7.1 -  Total Bilirubin 0.20 - 1.20 mg/dL 0.54 0.42 -  Alkaline Phos 40 - 150 U/L 270(H) 288(H) -  AST 5 - 34 U/L 68(H) 45(H) -  ALT 0 - 55 U/L 48 34 -     INITIAL tumor markers AFP 3.2 CEA > 10,000 CA 19-9 12,929.6  Results for LAKEN, LOBATO (MRN [id number]) as of 03/02/2015 10:20  Ref. Range 01/11/2015 09:51 02/02/2015 08:35 02/28/2015 09:16  CA 19-9 Latest Ref Range: <35.0 U/mL  1296.5 (H) 462.4 (H)  CEA Latest Ref Range: 0.0-5.0 ng/mL 1809.2 (H) 1327.9 (H) 694.4 (H)    Pathology report  Liver, needle/core biopsy - METASTATIC ADENOCARCINOMA, SEE COMMENT. Microscopic Comment The adenocarcinoma demonstrates the following immunophenotype: Cytokeratin 7 - negative expression. Cytokeratin 20 - strong  diffuse expression. CD2 - strong diffuse expression. Overall the morphology and immunophenotype are that of metastatic adenocarcinoma primary to colorectum. The recent nuclear medicine scan demonstrating sigmoid mass with associated liver masses is noted.      RADIOGRAPHIC STUDIES: I have personally reviewed the outside CT scan image with patient and his son.   PET 11/10/2014 IMPRESSION: 1. 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 Probable left adrenal and pulmonary metastasis. 2. Widespread hepatic metastasis. Favor secondary to colon primary. The liver does have a  somewhat atypical morphology, favored to be related to the extent of metastatic disease. Cirrhosis with synchronous multifocal hepatocellular carcinoma felt less likely.   ASSESSMENT & PLAN:  69 year old [place] male, with past history of hypertension and dilated nonischemic gammopathy with EF 25%, no clinical signs of heart failure, who was found to have hepatitis B infection lately, and multiple liver lesions on the CT scan. He has extremely high CEA and CA 19-9 levels. PET scan reviewed a hypermetabolic sigmoid colon mass, diffuse liver metastasis, probable lung and adrenal gland metastasis.  1. Metastatic sigmoid colon cancer, with diffuse liver, lungs, node and left adrenal gland metastases. KRAS/NRAS wild type, MSI-stable -Liver biopsy showed metastatic adenocarcinoma. His tumor were strongly positive for CK20 and CD2, consistent with primary colorectal primary. KRAS and NRAS mutations were not detected.  -Pt understands that this is incurable cancer, and he has very high disease burden and overall prognosis is poor. The treatment goal is palliative -His bilirubinemia has improved after first cycle chemotherapy.  -Giving the wild-type KRAS/NRAS genotype, he would benefit from EGFR targeted therapy. He started panitumumab from  second cycle chemo. He had G2-3 skin rash with neutropenic fever, and was admitted for GI bleeding after second dose panitumumab. Will hold on it for now. May consider cetuximab with second line chemo. -Avastin contraindicated due to his gastric ulcer and bleeding in April 2016.  -He is clinically doing well, hemoglobin stable, no more bleeding. We'll restart chemotherapy next week as scheduled  -Reschedule his restaging CT scan next week  2. Gastric ulcer with significant GI bleeding in April 2016 -He is on PPI. -He will follow-up with Dr. Fuller Plan in May.   3. Newly diagnosed diabetes -HbA1c was 8.5, blood glucose 111 today -Continue medication, follow up with primary care physician.  4. HTN, Dilated nonischemic ischemia cardiomyopathy with EF 25% -He is clinically doing well without symptoms of CHF. However this is probably going to impact his chemotherapy. I'll try to avoid cardiotoxic chemotherapy agent and avoid fluid overload during chemotherapy. -Continue follow-up with cardiology.  5 Hepatitis B carrier -Per liver clinic, no need for treatment. Follow-up with liver clinic.  7. Malnutrition -I encouraged him to eat more, and take supplements as needed. -follow up with Dietitian   Plan -Restart FOLFOX next Thursday as scheduled  -Restaging CT next week -Return to clinic in 3 weeks  All questions were answered. The patient knows to call the clinic with any problems, questions or concerns.  I spent 25 minutes counseling the patient face to face. The total time spent in the appointment was 30 minutes and more than 50% was on counseling.     Truitt Merle, MD 03/02/2015   8:47 AM

## 2015-03-02 NOTE — [facility] Oncology Navigator Note (Signed)
Met with patient and son, Barnabas Lister with assistance of interpreter during office visit to provide support and assess for needs to promote continuity of care 1. Provided my contact information to son and explained my role as navagator and how to reach me for issues  2. Communication is primary barrier in this case, will telephone son when scan is scheduled to ensure they are aware of appointment and prep. Son denies any other barriers to his care.  Merceda Elks, RN, BSN GI Oncology [place]

## 2015-03-02 NOTE — Telephone Encounter (Signed)
Gave and printed appt sched and avs for May..sed added tx...gv pt barium

## 2015-03-04 ENCOUNTER — Encounter: Payer: Self-pay | Admitting: Hematology

## 2015-03-06 ENCOUNTER — Telehealth: Payer: Self-pay | Admitting: *Deleted

## 2015-03-06 ENCOUNTER — Telehealth: Payer: Self-pay | Admitting: Hematology

## 2015-03-06 NOTE — Telephone Encounter (Signed)
pt cld & left voice mail to CX appt for 5/11-cld & spoke to pt and adv that Dr Lewayne Bunting nurse will have to r/s inf appt-pt understood-satted will be out of town

## 2015-03-06 NOTE — Telephone Encounter (Signed)
Called daughter in-law Jeani Hawking and informed her re:  Pt needs to keep his office visit and chemo appts as scheduled to achieve treatment benefits as planned by md.  Informed Jeani Hawking that pt can still go to church on Saturday after chemo pump disconnected, but pt will need to wear a  mask for self proctection.  Jeani Hawking voiced understanding and stated she would relay message to pt and son.  Jeani Hawking stated she would call back if pt still insisted on rescheduling appts.

## 2015-03-06 NOTE — Telephone Encounter (Signed)
Received VM message @ 1:13 pm from pt's family member requesting to change appts this week as patient wants to go to [place] on Saturday and she didn't know if he could go since he will be getting chemo on Thursday with 5FU pump, and pump would be due to be removed on Saturday. Please call Jeani Hawking back @ [phone].

## 2015-03-09 ENCOUNTER — Telehealth: Payer: Self-pay | Admitting: *Deleted

## 2015-03-09 ENCOUNTER — Ambulatory Visit: Payer: Medicare Other | Admitting: Nurse Practitioner

## 2015-03-09 ENCOUNTER — Other Ambulatory Visit: Payer: Medicare Other

## 2015-03-09 ENCOUNTER — Other Ambulatory Visit: Payer: Self-pay | Admitting: *Deleted

## 2015-03-09 ENCOUNTER — Ambulatory Visit: Payer: Medicare Other

## 2015-03-09 NOTE — Telephone Encounter (Signed)
Received message that pt had cancelled appts for today.  Spoke with son Jason Reilly, and gave pt appt for pt's  CT scan scheduled for 03/13/15.  Instructed Jason Reilly to make sure pt is NPO for the scan. Abbie Sons appts date and time for lab, office visit, and chemo  On  Wed.  03/15/15.  Reinforced with Jason Reilly that it is very important for pt to keep appts as scheduled.  Encouraged Jason Reilly to accompany pt to all appts as possible.  Jason Reilly voiced understanding.

## 2015-03-10 ENCOUNTER — Telehealth: Payer: Self-pay | Admitting: Hematology

## 2015-03-10 ENCOUNTER — Telehealth: Payer: Self-pay | Admitting: *Deleted

## 2015-03-10 NOTE — Telephone Encounter (Signed)
Spoke with patient via [employer] re appointments for 03/15/15. Interpreter Thomos Lemons #[id number].

## 2015-03-10 NOTE — Telephone Encounter (Signed)
Called son, Barnabas Lister and confirmed the CT c/a/p for 5/16 at 0745 with him. Instructed him to have Jeremi NPO 4 hours prior and to drink contrast at 6 am and 7 am. He understands and agrees.

## 2015-03-13 ENCOUNTER — Encounter (HOSPITAL_COMMUNITY): Payer: Self-pay

## 2015-03-13 ENCOUNTER — Ambulatory Visit (HOSPITAL_COMMUNITY)
Admission: RE | Admit: 2015-03-13 | Discharge: 2015-03-13 | Disposition: A | Discharge status: Home / Self Care | Payer: Medicare Other | Source: Ambulatory Visit | Attending: Physician Assistant | Admitting: Physician Assistant

## 2015-03-13 DIAGNOSIS — C787 Secondary malignant neoplasm of liver and intrahepatic bile duct: Secondary | ICD-10-CM | POA: Insufficient documentation

## 2015-03-13 DIAGNOSIS — C189 Malignant neoplasm of colon, unspecified: Secondary | ICD-10-CM

## 2015-03-13 MED ORDER — IOHEXOL 300 MG/ML  SOLN
100.0000 mL | Freq: Once | INTRAMUSCULAR | Status: AC | PRN
  Administered 2015-03-13: 100 mL via INTRAVENOUS

## 2015-03-15 ENCOUNTER — Telehealth: Payer: Self-pay | Admitting: Hematology

## 2015-03-15 ENCOUNTER — Encounter: Payer: Self-pay | Admitting: Hematology

## 2015-03-15 ENCOUNTER — Ambulatory Visit (HOSPITAL_BASED_OUTPATIENT_CLINIC_OR_DEPARTMENT_OTHER): Payer: Medicare Other

## 2015-03-15 ENCOUNTER — Ambulatory Visit (HOSPITAL_BASED_OUTPATIENT_CLINIC_OR_DEPARTMENT_OTHER): Payer: Medicare Other | Admitting: Hematology

## 2015-03-15 ENCOUNTER — Other Ambulatory Visit (HOSPITAL_BASED_OUTPATIENT_CLINIC_OR_DEPARTMENT_OTHER): Payer: Medicare Other

## 2015-03-15 VITALS — BP 157/65 | HR 72 | Temp 98.2°F | Resp 18 | Ht 65.0 in | Wt 140.2 lb

## 2015-03-15 DIAGNOSIS — C7972 Secondary malignant neoplasm of left adrenal gland: Secondary | ICD-10-CM | POA: Diagnosis not present

## 2015-03-15 DIAGNOSIS — E119 Type 2 diabetes mellitus without complications: Secondary | ICD-10-CM

## 2015-03-15 DIAGNOSIS — C787 Secondary malignant neoplasm of liver and intrahepatic bile duct: Secondary | ICD-10-CM

## 2015-03-15 DIAGNOSIS — C187 Malignant neoplasm of sigmoid colon: Secondary | ICD-10-CM

## 2015-03-15 DIAGNOSIS — C189 Malignant neoplasm of colon, unspecified: Secondary | ICD-10-CM

## 2015-03-15 DIAGNOSIS — Z5111 Encounter for antineoplastic chemotherapy: Secondary | ICD-10-CM

## 2015-03-15 DIAGNOSIS — C772 Secondary and unspecified malignant neoplasm of intra-abdominal lymph nodes: Secondary | ICD-10-CM | POA: Diagnosis not present

## 2015-03-15 DIAGNOSIS — C7800 Secondary malignant neoplasm of unspecified lung: Secondary | ICD-10-CM

## 2015-03-15 LAB — COMPREHENSIVE METABOLIC PANEL (CC13)
ALT: 47 U/L (ref 0–55)
ANION GAP: 7 meq/L (ref 3–11)
AST: 69 U/L — AB (ref 5–34)
Albumin: 3.3 g/dL — ABNORMAL LOW (ref 3.5–5.0)
Alkaline Phosphatase: 294 U/L — ABNORMAL HIGH (ref 40–150)
BILIRUBIN TOTAL: 0.61 mg/dL (ref 0.20–1.20)
BUN: 7.2 mg/dL (ref 7.0–26.0)
CO2: 25 meq/L (ref 22–29)
Calcium: 9.1 mg/dL (ref 8.4–10.4)
Chloride: 108 mEq/L (ref 98–109)
Creatinine: 1 mg/dL (ref 0.7–1.3)
EGFR: 80 mL/min/{1.73_m2} — ABNORMAL LOW (ref >90)
Glucose: 68 mg/dl — ABNORMAL LOW (ref 70–140)
Potassium: 4.3 mEq/L (ref 3.5–5.1)
SODIUM: 140 meq/L (ref 136–145)
TOTAL PROTEIN: 8 g/dL (ref 6.4–8.3)

## 2015-03-15 LAB — CBC WITH DIFFERENTIAL/PLATELET
BASO%: 0.8 % (ref 0.0–2.0)
Basophils Absolute: 0 10*3/uL (ref 0.0–0.1)
EOS%: 3.7 % (ref 0.0–7.0)
Eosinophils Absolute: 0.1 10*3/uL (ref 0.0–0.5)
HEMATOCRIT: 29.4 % — AB (ref 38.4–49.9)
HGB: 9.3 g/dL — ABNORMAL LOW (ref 13.0–17.1)
LYMPH%: 26.9 % (ref 14.0–49.0)
MCH: 26.8 pg — ABNORMAL LOW (ref 27.2–33.4)
MCHC: 31.6 g/dL — AB (ref 32.0–36.0)
MCV: 84.7 fL (ref 79.3–98.0)
MONO#: 0.4 10*3/uL (ref 0.1–0.9)
MONO%: 10.6 % (ref 0.0–14.0)
NEUT%: 58 % (ref 39.0–75.0)
NEUTROS ABS: 2.2 10*3/uL (ref 1.5–6.5)
Platelets: 111 10*3/uL — ABNORMAL LOW (ref 140–400)
RBC: 3.47 10*6/uL — AB (ref 4.20–5.82)
RDW: 15.6 % — ABNORMAL HIGH (ref 11.0–14.6)
WBC: 3.8 10*3/uL — AB (ref 4.0–10.3)
lymph#: 1 10*3/uL (ref 0.9–3.3)

## 2015-03-15 MED ORDER — OXALIPLATIN CHEMO INJECTION 100 MG/20ML
85.0000 mg/m2 | Freq: Once | INTRAVENOUS | Status: AC
  Administered 2015-03-15: 135 mg via INTRAVENOUS
  Filled 2015-03-15: qty 27

## 2015-03-15 MED ORDER — SODIUM CHLORIDE 0.9 % IJ SOLN
10.0000 mL | INTRAMUSCULAR | Status: DC | PRN
  Filled 2015-03-15: qty 10

## 2015-03-15 MED ORDER — SODIUM CHLORIDE 0.9 % IV SOLN: 2400.0000 mg/m2 | INTRAVENOUS | Status: DC

## 2015-03-15 MED ORDER — HEPARIN SOD (PORK) LOCK FLUSH 100 UNIT/ML IV SOLN
500.0000 [IU] | Freq: Once | INTRAVENOUS | Status: DC | PRN
  Filled 2015-03-15: qty 5

## 2015-03-15 MED ORDER — LEUCOVORIN CALCIUM INJECTION 350 MG
404.0000 mg/m2 | Freq: Once | INTRAVENOUS | Status: AC
  Administered 2015-03-15: 630 mg via INTRAVENOUS
  Filled 2015-03-15: qty 31.5

## 2015-03-15 MED ORDER — SODIUM CHLORIDE 0.9 % IV SOLN
Freq: Once | INTRAVENOUS | Status: AC
  Administered 2015-03-15: 13:00:00 via INTRAVENOUS
  Filled 2015-03-15: qty 4

## 2015-03-15 MED ORDER — DEXTROSE 5 % IV SOLN
Freq: Once | INTRAVENOUS | Status: AC
  Administered 2015-03-15: 13:00:00 via INTRAVENOUS

## 2015-03-15 MED ORDER — SODIUM CHLORIDE 0.9 % IV SOLN
3750.0000 mg | INTRAVENOUS | Status: DC
  Administered 2015-03-15: 3750 mg via INTRAVENOUS
  Filled 2015-03-15: qty 75

## 2015-03-15 NOTE — Progress Notes (Signed)
[facility]  [phone] Fax:([phone]) [place] Note   Patient Care Team: [name], MD as PCP - General (Specialist) [name], CRNP as Nurse Practitioner (Nurse Practitioner) Truitt Merle, MD as Consulting Physician (Hematology) 03/15/2015  CHIEF COMPLAINTS Metastatic colon cancer Oncology History   He is a Metastatic colon cancer to liver   Staging form: Colon and Rectum, AJCC 7th Edition     Clinical: Stage Unknown ([place], NX, M1) - Unsigned  Our health is     Metastatic colon cancer to liver   10/28/2014 Tumor Marker AFP 3.2 CEA > 10,000 CA 19-9 12,929.6. tumor (-) KRAS and NRAS mutation.    11/10/2014 Imaging PET scan showed hypermetabolic mass in the sigmoid colon was noted metastasis in the retroperitoneum. Probable left adrenal and pulmonary metastasis, and diffuse liver metastasis.   11/21/2014 Initial Diagnosis Metastatic colon cancer to liver, lung, abd nodes and left adrenal gland. Diagnosis was made by liver biopsy.    11/30/2014 -  Chemotherapy First line chemo mFOLFOX6, Panitumumab added from second cycle    12/28/2014 - 12/31/2014 Hospital Admission Was admitted for dehydration, neutropenia fever with UTI, and severe skin rash.   02/08/2015 - 02/12/2015 Hospital Admission He was admitted for upper GI bleeding, e.g. showed a gastric ulcer with clots, status post appendectomy injection. He also received a blood transfusion.     CURRENT THERAPY: mFOLFOX6 started on 11/30/2014, Panitumumab added to second cycle on 12/14/14, 50% dose reduction with cycle 4, then stopped due to possible AEs  HISTORY OF PRESENTING ILLNESS:  Jason Reilly 69 y.o. male is here because of abnormal CT findings, which is very suspicious for malignancy. He is on ranitidine from [place], has been on in the [place] for 16 years. He came in with his son and an interpreter.  He has been feeling fatigued since two month ago. He is still able to do all ADLs. He otherwise  denies any pain, bloating or nausea.  He lost about 20lbs in 3 month. His appetite is lower than before, eats less, no change of his bowl habits.  She denied any hematochezia or melana. Per his son, he has had some personality changes daily, irritable, slightly confused some time.  He was evaluated by his primary care physician. Lab test reviewed hepatitis B infection, which he did not know before, and elevated alkaline phosphatase, his liver function and the rest of the liver function was not remarkable. [place] of abdomen was obtained on 07/22/2014, which showed diffusely abnormal liver with multiple echogenic lesions. CT of abdomen with and without contrast was done on 08/26/2014, which reviewed here at a medically with multiple large partially calcified hepatic masses consistent with metastatic disease. Mild retroperitoneal adenopathy with the largest node measuring 1.6 cm. And nonspecific 1.4 cm left adrenal nodule was also noticed. His tumor marker showed CEA greater than 10,000, CA 19-9 12,929, AFP 3.2 (normal). He was referred to [facility] liver clinic and was evaluated by nurse practitioner [name]. Treatment for hepatitis B was not recommended based on his virus load.  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 He was evaluated by a cardiologist in 2014. He denies any significant dyspnea on exertion. No leg swollen.  INTERIM HISTORY: Werner returns for follow-up. He left without chemo last week. He probably had 40 point the last week, developed nausea vomiting and diarrhea after eating mushroom at home. His symptoms improved quickly in a few days. He  feels well this week. He denies any pain, fever or chills, bleeding, or any other symptoms.   MEDICAL HISTORY:  Past Medical History  Diagnosis Date  . Tachycardia   . Abnormal EKG   . Hypertension   . Non-ischemic cardiomyopathy   . Hepatitis B   . H. pylori infection   . Gastric  ulcer   . Metastatic colon cancer to liver     SURGICAL HISTORY: Past Surgical History  Procedure Laterality Date  . Nuclear stress test  03/03/2013    High risk - consistent with nonischemic cardiomyopathy  . Left and right heart catheterization with coronary angiogram N/A 03/29/2013    Procedure: LEFT AND RIGHT HEART CATHETERIZATION WITH CORONARY ANGIOGRAM;  Surgeon: Pixie Casino, MD;  Location: [facility] CATH [facility];  Service: Cardiovascular;  Laterality: N/A;  . Esophagogastroduodenoscopy N/A 02/08/2015    Procedure: ESOPHAGOGASTRODUODENOSCOPY (EGD);  Surgeon: Ladene Artist, MD;  Location: [facility];  Service: Endoscopy;  Laterality: N/A;    SOCIAL HISTORY: History   Social History  . Marital Status: Married    Spouse Name: N/A  . Number of Children: N/A  . Years of Education: N/A   Occupational History  . Not on file.   Social History Main Topics  . Smoking status: Former Research scientist (life sciences)  . Smokeless tobacco: Never Used  . Alcohol Use: Yes     Comment: occasional  . Drug Use: No  . Sexual Activity: No   Other Topics Concern  . Not on file   Social History Narrative    FAMILY HISTORY: No family history of liver disease or malignancy.  ALLERGIES:  has No Known Allergies.  MEDICATIONS:  Current Outpatient Prescriptions on File Prior to Visit  Medication Sig Dispense Refill  . clindamycin (CLINDAGEL) 1 % gel Apply topically 2 (two) times daily. 30 g 2  . doxycycline (VIBRAMYCIN) 100 MG capsule Take 1 capsule (100 mg total) by mouth 2 (two) times daily. 20 capsule 0  . hydrocortisone 2.5 % cream Apply topically 2 (two) times daily. Once daily on face and twice daily on body 30 g 2  . lidocaine-prilocaine (EMLA) cream Apply 1 application topically as needed. Apply to [facility] cath at least one hour before needle stick. 30 g 2  . linagliptin (TRADJENTA) 5 MG TABS tablet Take 1 tablet (5 mg total) by mouth daily. 30 tablet 1  . lisinopril-hydrochlorothiazide (PRINZIDE,ZESTORETIC)  10-12.5 MG per tablet Take 1 tablet by mouth daily.  3  . loperamide (IMODIUM A-D) 2 MG tablet Take 2 mg by mouth 4 (four) times daily as needed for diarrhea or loose stools.    . metFORMIN (GLUCOPHAGE) 500 MG tablet Take by mouth 2 (two) times daily with a meal.    . metroNIDAZOLE (FLAGYL) 250 MG tablet Take 1 tablet (250 mg total) by mouth 2 (two) times daily. 20 tablet 0  . mirtazapine (REMERON) 15 MG tablet Take 1 tablet (15 mg total) by mouth at bedtime. 30 tablet 2  . ondansetron (ZOFRAN) 8 MG tablet Take 1 tablet (8 mg total) by mouth every 8 (eight) hours as needed for nausea or vomiting. 45 tablet 0  . pantoprazole (PROTONIX) 40 MG tablet Take 1 tablet (40 mg total) by mouth 2 (two) times daily. 60 tablet 0   No current facility-administered medications on file prior to visit.  ;   REVIEW OF SYSTEMS:   Constitutional: Denies fevers, chills or abnormal night sweats, (+) fatigue  Eyes: Denies blurriness of vision, double vision or watery  eyes Ears, nose, mouth, throat, and face: Denies mucositis or sore throat Respiratory: Denies cough, dyspnea or wheezes Cardiovascular: Denies palpitation, chest discomfort or lower extremity swelling Gastrointestinal: Denies nausea, heartburn or change in bowel habits Skin: Denies abnormal skin rashes Lymphatics: Denies new lymphadenopathy or easy bruising Neurological:Denies numbness, tingling or new weaknesses Behavioral/Psych: Mood is stable, no new changes, (+) insomnia All other systems were reviewed with the patient and are negative.  PHYSICAL EXAMINATION: BP 157/65 mmHg  Pulse 72  Temp(Src) 98.2 F (36.8 C) (Oral)  Resp 18  Ht _0  (1.651 m)  Wt 140 lb 3.2 oz (63.594 kg)  BMI 23.33 kg/m2  SpO2 98%  ECOG PERFORMANCE STATUS: 3 Vital sign were taken at in the infusion room, within normal limits. GENERAL:alert, no distress and comfortable SKIN: (+) Dry skin, scatter acne like skin rash es on the face, neck and trunk, better than last  time, no skin ulcer or discharge. EYES: normal, conjunctiva are pink and non-injected, sclera clear OROPHARYNX:no exudate, no erythema and lips, buccal mucosa, and tongue normal  NECK: supple, thyroid normal size, non-tender, without nodularity LYMPH:  no palpable lymphadenopathy in the cervical, axillary or inguinal LUNGS: clear to auscultation and percussion with normal breathing effort HEART: regular rate & rhythm and no murmurs and no lower extremity edema ABDOMEN:abdomen soft, non-tender, no hepatomegaly, no splenomegaly and normal bowel sounds Musculoskeletal:no cyanosis of digits and no clubbing  PSYCH: alert & oriented x 3 with fluent speech NEURO: no focal motor/sensory deficits  LABORATORY DATA:  I have reviewed the data as listed CBC Latest Ref Rng 03/15/2015 02/28/2015 02/16/2015  WBC 4.0 - 10.3 10e3/uL 3.8(L) 5.1 12.7(H)  Hemoglobin 13.0 - 17.1 g/dL 9.3(L) 9.2(L) 9.1(L)  Hematocrit 38.4 - 49.9 % 29.4(L) 28.6(L) 28.8(L)  Platelets 140 - 400 10e3/uL 111(L) 214 170    CMP Latest Ref Rng 03/15/2015 02/28/2015 02/16/2015  Glucose 70 - 140 mg/dl 68(L) 111 110  BUN 7.0 - 26.0 mg/dL 7.2 9.8 8.1  Creatinine 0.7 - 1.3 mg/dL 1.0 0.9 0.7  Sodium 136 - 145 mEq/L 140 138 141  Potassium 3.5 - 5.1 mEq/L 4.3 4.4 4.3  Chloride 96 - 112 mmol/L - - -  CO2 22 - 29 mEq/L _1 Calcium 8.4 - 10.4 mg/dL 9.1 9.7 8.4  Total Protein 6.4 - 8.3 g/dL 8.0 8.5(H) 7.1  Total Bilirubin 0.20 - 1.20 mg/dL 0.61 0.54 0.42  Alkaline Phos 40 - 150 U/L 294(H) 270(H) 288(H)  AST 5 - 34 U/L 69(H) 68(H) 45(H)  ALT 0 - 55 U/L 47 48 34     INITIAL tumor markers AFP 3.2 CEA > 10,000 CA 19-9 12,929.6  CEA  Status: Finalresult Visible to patient:  Not ReleaShe still is sed Nextappt: Today at 12:30 PM in Oncology ([place]) Dx:  Metastatic colon cancer to liver              Ref Range 2wk ago  22moago  2440mogo     CEA 0.0 - 5.0 ng/mL 694.4 (H) 1327.9 (H)CM 1809.2 (H)CM     Comments: Result confirmed by automatic dilution.        CA 19.9  Status: Finalresult Visible to patient:  Not Released Nextappt: Today at 12:30 PM in Oncology ([facility]:  Metastatic colon cancer to liver              Ref Range 2wk ago  40m88moo     CA 19-9 <35.0 U/mL 462.4 (H) 1296.5 (  H)CM          Pathology report  Liver, needle/core biopsy - METASTATIC ADENOCARCINOMA, SEE COMMENT. Microscopic Comment The adenocarcinoma demonstrates the following immunophenotype: Cytokeratin 7 - negative expression. Cytokeratin 20 - strong diffuse expression. CD2 - strong diffuse expression. Overall the morphology and immunophenotype are that of metastatic adenocarcinoma primary to colorectum. The recent nuclear medicine scan demonstrating sigmoid mass with associated liver masses is noted.      RADIOGRAPHIC STUDIES: I have personally reviewed the outside CT scan image with patient and his son.   CT chest, abdomen and pelvis with IV contrast on 03/13/2015. IMPRESSION: 1. Interval response to therapy as evidenced by slight decrease in prominence of masslike thickening involving the sigmoid colon, as well as slight decrease in size of hepatic metastatic masses and left adrenal metastasis. New or increased calcification involving the body of the right adrenal gland may represent a treated occult metastasis. 2. Slight decrease in size of a lymph node in the sigmoid mesentery. Left periaortic lymph node is stable. 3. Coronary artery calcification. 4. Small bilateral inguinal hernias contain fat.   ASSESSMENT & PLAN:  69 year old [place] male, with past history of hypertension and dilated nonischemic gammopathy with EF 25%, no clinical signs of heart failure, who was found to have hepatitis B infection lately, and multiple liver lesions on the CT scan. He has extremely high CEA and CA 19-9 levels. PET scan reviewed a hypermetabolic sigmoid colon mass,  diffuse liver metastasis, probable lung and adrenal gland metastasis.  1. Metastatic sigmoid colon cancer, with diffuse liver, lungs, node and left adrenal gland metastases. KRAS/NRAS wild type, MSI-stable -Liver biopsy showed metastatic adenocarcinoma. His tumor were strongly positive for CK20 and CD2, consistent with primary colorectal primary. KRAS and NRAS mutations were not detected.  -Pt understands that this is incurable cancer, and he has very high disease burden and overall prognosis is poor. The treatment goal is palliative -His bilirubinemia has improved after first cycle chemotherapy.  -Giving the wild-type KRAS/NRAS genotype, he would benefit from EGFR targeted therapy. He started panitumumab from second cycle chemo. He had G2-3 skin rash with neutropenic fever, and was admitted for GI bleeding after second dose panitumumab. Will hold on it for now. May consider cetuximab with second line chemo. -Avastin contraindicated due to his gastric ulcer and bleeding in April 2016.  -I discussed his restaging CT scan, which showed partial response. No new lesions. He is doing well and his tumor marker has come down dramatically, supporting good clinical response.  -He is clinically doing well, hemoglobin stable, no more bleeding. We'll restart chemotherapy today  -Lab reviewed, mild thrombocytopenia, adequate for treatment we'll proceed with chemotherapy, no 5-FU bolus today.   2. Gastric ulcer with significant GI bleeding in April 2016 -He is on PPI. -He will follow-up with Dr. Fuller Plan in May.   3. Newly diagnosed diabetes -HbA1c was 8.5, blood glucose 111 today -Continue medication, follow up with primary care physician.  4. HTN, Dilated nonischemic ischemia cardiomyopathy with EF 25% -He is clinically doing well without symptoms of CHF. However this is probably going to impact his chemotherapy. I'll try to avoid cardiotoxic chemotherapy agent and avoid fluid overload during  chemotherapy. -Continue follow-up with cardiology.  5 Hepatitis B carrier -Per liver clinic, no need for treatment. Follow-up with liver clinic.  7. Malnutrition -I encouraged him to eat more, and take supplements as needed. -follow up with Dietitian   Plan -Restart FOLFOX today,  no 5-FU bolus -Return to clinic  in 2 weeks  All questions were answered. The patient knows to call the clinic with any problems, questions or concerns.  I spent 25 minutes counseling the patient face to face. The total time spent in the appointment was 30 minutes and more than 50% was on counseling.     Truitt Merle, MD 03/15/2015   11:17 AM

## 2015-03-15 NOTE — Progress Notes (Signed)
Patient is here with interpreter.

## 2015-03-15 NOTE — Patient Instructions (Signed)
[facility] Discharge Instructions for Patients Receiving Chemotherapy  Today you received the following chemotherapy agents: OXaliplatin, Leucovorin, 5FU.  To help prevent nausea and vomiting after your treatment, we encourage you to take your nausea medication: Zofran 8 mg every 8 hours as needed.   If you develop nausea and vomiting that is not controlled by your nausea medication, call the clinic.   BELOW ARE SYMPTOMS THAT SHOULD BE REPORTED IMMEDIATELY:  *FEVER GREATER THAN 100.5 F  *CHILLS WITH OR WITHOUT FEVER  NAUSEA AND VOMITING THAT IS NOT CONTROLLED WITH YOUR NAUSEA MEDICATION  *UNUSUAL SHORTNESS OF BREATH  *UNUSUAL BRUISING OR BLEEDING  TENDERNESS IN MOUTH AND THROAT WITH OR WITHOUT PRESENCE OF ULCERS  *URINARY PROBLEMS  *BOWEL PROBLEMS  UNUSUAL RASH Items with * indicate a potential emergency and should be followed up as soon as possible.  Feel free to call the clinic you have any questions or concerns. The clinic phone number is [phone].  Please show the [facility] at check to the Emergency Department and triage nurse.

## 2015-03-15 NOTE — Telephone Encounter (Signed)
Gave and printed appt sched and avs for pt for May and June  °

## 2015-03-17 ENCOUNTER — Encounter: Payer: Self-pay | Admitting: Gastroenterology

## 2015-03-17 ENCOUNTER — Ambulatory Visit (INDEPENDENT_AMBULATORY_CARE_PROVIDER_SITE_OTHER): Payer: Medicare Other | Admitting: Gastroenterology

## 2015-03-17 ENCOUNTER — Ambulatory Visit (HOSPITAL_BASED_OUTPATIENT_CLINIC_OR_DEPARTMENT_OTHER): Payer: Medicare Other

## 2015-03-17 VITALS — BP 148/76 | HR 96 | Temp 98.1°F

## 2015-03-17 VITALS — BP 128/60 | HR 84 | Ht 64.25 in | Wt 142.1 lb

## 2015-03-17 DIAGNOSIS — C189 Malignant neoplasm of colon, unspecified: Secondary | ICD-10-CM

## 2015-03-17 DIAGNOSIS — C187 Malignant neoplasm of sigmoid colon: Secondary | ICD-10-CM

## 2015-03-17 DIAGNOSIS — Z5189 Encounter for other specified aftercare: Secondary | ICD-10-CM

## 2015-03-17 DIAGNOSIS — C787 Secondary malignant neoplasm of liver and intrahepatic bile duct: Secondary | ICD-10-CM | POA: Diagnosis not present

## 2015-03-17 DIAGNOSIS — K250 Acute gastric ulcer with hemorrhage: Secondary | ICD-10-CM

## 2015-03-17 DIAGNOSIS — Z452 Encounter for adjustment and management of vascular access device: Secondary | ICD-10-CM

## 2015-03-17 MED ORDER — PEGFILGRASTIM INJECTION 6 MG/0.6ML ~~LOC~~
6.0000 mg | PREFILLED_SYRINGE | Freq: Once | SUBCUTANEOUS | Status: AC
  Administered 2015-03-17: 6 mg via SUBCUTANEOUS
  Filled 2015-03-17: qty 0.6

## 2015-03-17 MED ORDER — PANTOPRAZOLE SODIUM 40 MG PO TBEC: 40.0000 mg | DELAYED_RELEASE_TABLET | Freq: Every day | ORAL | Status: DC

## 2015-03-17 MED ORDER — HEPARIN SOD (PORK) LOCK FLUSH 100 UNIT/ML IV SOLN
500.0000 [IU] | Freq: Once | INTRAVENOUS | Status: AC | PRN
  Administered 2015-03-17: 500 [IU]
  Filled 2015-03-17: qty 5

## 2015-03-17 MED ORDER — SODIUM CHLORIDE 0.9 % IJ SOLN
10.0000 mL | INTRAMUSCULAR | Status: DC | PRN
  Administered 2015-03-17: 10 mL
  Filled 2015-03-17: qty 10

## 2015-03-17 NOTE — Patient Instructions (Signed)
We have printed medications for you to take to your pharmacy: pantoprazole.  Thank you for choosing me and [facility].  Pricilla Riffle. Dagoberto Ligas., MD., Marval Regal  cc: [place] Ram, MD

## 2015-03-17 NOTE — Progress Notes (Signed)
    History of Present Illness: This is a 69 year old male with metastatic colon cancer to the liver undergoing chemotherapy. He is accompanied by his daughter and a Optometrist. He was hospitalized with an acute GI bleed found secondary to a gastric ulcer at endoscopy. Most recent hemoglobin was 9.3. He relates no gastrointestinal complaints. He denies abdominal pain, melena and hematochezia.  Current Medications, Allergies, Past Medical History, Past Surgical History, Family History and Social History were reviewed in [employer] record.  Physical Exam: General: Well developed , well nourished, no acute distress Head: Normocephalic and atraumatic Eyes:  sclerae anicteric, EOMI Ears: Normal auditory acuity Mouth: No deformity or lesions Lungs: Clear throughout to auscultation Heart: Regular rate and rhythm; no murmurs, rubs or bruits Abdomen: Soft, non tender and non distended. No masses, hepatosplenomegaly or hernias noted. Normal Bowel sounds Musculoskeletal: Symmetrical with no gross deformities  Pulses:  Normal pulses noted Extremities: No clubbing, cyanosis, edema or deformities noted Neurological: Alert oriented x 4, grossly nonfocal Psychological:  Alert and cooperative. Normal mood and affect  Assessment and Recommendations:  1. Gastric ulcer with hemorrhage. Most recent Hb=9.3. Change Protonix 40 mg from twice daily to once daily long term, indefinitely. Avoid ASA and NSAIDs long-term, indefinitely. Given CM with EF=25% and ongoing chemotherapy will not plan for a follow up EGD at this time. GI follow up as needed.   2. Metastatic colon cancer. Continue Oncology follow up.  3. Hep B.

## 2015-03-17 NOTE — Patient Instructions (Signed)
Fluorouracil, 5-FU injection What is this medicine? FLUOROURACIL, 5-FU (flure oh YOOR a sil) is a chemotherapy drug. It slows the growth of cancer cells. This medicine is used to treat many types of cancer like breast cancer, colon or rectal cancer, pancreatic cancer, and stomach cancer. This medicine may be used for other purposes; ask your health care provider or pharmacist if you have questions. COMMON BRAND NAME(S): Adrucil What should I tell my health care provider before I take this medicine? They need to know if you have any of these conditions: -blood disorders -dihydropyrimidine dehydrogenase (DPD) deficiency -infection (especially a virus infection such as chickenpox, cold sores, or herpes) -kidney disease -liver disease -malnourished, poor nutrition -recent or ongoing radiation therapy -an unusual or allergic reaction to fluorouracil, other chemotherapy, other medicines, foods, dyes, or preservatives -pregnant or trying to get pregnant -breast-feeding How should I use this medicine? This drug is given as an infusion or injection into a vein. It is administered in a hospital or clinic by a specially trained health care professional. Talk to your pediatrician regarding the use of this medicine in children. Special care may be needed. Overdosage: If you think you have taken too much of this medicine contact a poison control center or emergency room at once. NOTE: This medicine is only for you. Do not share this medicine with others. What if I miss a dose? It is important not to miss your dose. Call your doctor or health care professional if you are unable to keep an appointment. What may interact with this medicine? -allopurinol -cimetidine -dapsone -digoxin -hydroxyurea -leucovorin -levamisole -medicines for seizures like ethotoin, fosphenytoin, phenytoin -medicines to increase blood counts like filgrastim, pegfilgrastim, sargramostim -medicines that treat or prevent blood  clots like warfarin, enoxaparin, and dalteparin -methotrexate -metronidazole -pyrimethamine -some other chemotherapy drugs like busulfan, cisplatin, estramustine, vinblastine -trimethoprim -trimetrexate -vaccines Talk to your doctor or health care professional before taking any of these medicines: -acetaminophen -aspirin -ibuprofen -ketoprofen -naproxen This list may not describe all possible interactions. Give your health care provider a list of all the medicines, herbs, non-prescription drugs, or dietary supplements you use. Also tell them if you smoke, drink alcohol, or use illegal drugs. Some items may interact with your medicine. What should I watch for while using this medicine? Visit your doctor for checks on your progress. This drug may make you feel generally unwell. This is not uncommon, as chemotherapy can affect healthy cells as well as cancer cells. Report any side effects. Continue your course of treatment even though you feel ill unless your doctor tells you to stop. In some cases, you may be given additional medicines to help with side effects. Follow all directions for their use. Call your doctor or health care professional for advice if you get a fever, chills or sore throat, or other symptoms of a cold or flu. Do not treat yourself. This drug decreases your body's ability to fight infections. Try to avoid being around people who are sick. This medicine may increase your risk to bruise or bleed. Call your doctor or health care professional if you notice any unusual bleeding. Be careful brushing and flossing your teeth or using a toothpick because you may get an infection or bleed more easily. If you have any dental work done, tell your dentist you are receiving this medicine. Avoid taking products that contain aspirin, acetaminophen, ibuprofen, naproxen, or ketoprofen unless instructed by your doctor. These medicines may hide a fever. Do not become pregnant while taking this    medicine. Women should inform their doctor if they wish to become pregnant or think they might be pregnant. There is a potential for serious side effects to an unborn child. Talk to your health care professional or pharmacist for more information. Do not breast-feed an infant while taking this medicine. Men should inform their doctor if they wish to father a child. This medicine may lower sperm counts. Do not treat diarrhea with over the counter products. Contact your doctor if you have diarrhea that lasts more than 2 days or if it is severe and watery. This medicine can make you more sensitive to the sun. Keep out of the sun. If you cannot avoid being in the sun, wear protective clothing and use sunscreen. Do not use sun lamps or tanning beds/booths. What side effects may I notice from receiving this medicine? Side effects that you should report to your doctor or health care professional as soon as possible: -allergic reactions like skin rash, itching or hives, swelling of the face, lips, or tongue -low blood counts - this medicine may decrease the number of white blood cells, red blood cells and platelets. You may be at increased risk for infections and bleeding. -signs of infection - fever or chills, cough, sore throat, pain or difficulty passing urine -signs of decreased platelets or bleeding - bruising, pinpoint red spots on the skin, black, tarry stools, blood in the urine -signs of decreased red blood cells - unusually weak or tired, fainting spells, lightheadedness -breathing problems -changes in vision -chest pain -mouth sores -nausea and vomiting -pain, swelling, redness at site where injected -pain, tingling, numbness in the hands or feet -redness, swelling, or sores on hands or feet -stomach pain -unusual bleeding Side effects that usually do not require medical attention (report to your doctor or health care professional if they continue or are bothersome): -changes in finger or  toe nails -diarrhea -dry or itchy skin -hair loss -headache -loss of appetite -sensitivity of eyes to the light -stomach upset -unusually teary eyes This list may not describe all possible side effects. Call your doctor for medical advice about side effects. You may report side effects to FDA at 1-800-FDA-1088. Where should I keep my medicine? This drug is given in a hospital or clinic and will not be stored at home. NOTE: This sheet is a summary. It may not cover all possible information. If you have questions about this medicine, talk to your doctor, pharmacist, or health care provider.  2015, Elsevier/Gold Standard. (2008-02-17 13:53:16)   

## 2015-03-23 ENCOUNTER — Ambulatory Visit: Payer: Medicare Other | Admitting: Hematology

## 2015-03-23 ENCOUNTER — Ambulatory Visit: Payer: Medicare Other

## 2015-03-23 ENCOUNTER — Other Ambulatory Visit: Payer: Medicare Other

## 2015-03-29 ENCOUNTER — Other Ambulatory Visit (HOSPITAL_BASED_OUTPATIENT_CLINIC_OR_DEPARTMENT_OTHER): Payer: Medicare Other

## 2015-03-29 ENCOUNTER — Ambulatory Visit (HOSPITAL_BASED_OUTPATIENT_CLINIC_OR_DEPARTMENT_OTHER): Payer: Medicare Other | Admitting: Nurse Practitioner

## 2015-03-29 ENCOUNTER — Telehealth: Payer: Self-pay | Admitting: Nurse Practitioner

## 2015-03-29 VITALS — BP 134/71 | HR 72 | Temp 98.0°F | Resp 18 | Ht 64.25 in | Wt 141.9 lb

## 2015-03-29 DIAGNOSIS — I10 Essential (primary) hypertension: Secondary | ICD-10-CM | POA: Diagnosis not present

## 2015-03-29 DIAGNOSIS — C7972 Secondary malignant neoplasm of left adrenal gland: Secondary | ICD-10-CM | POA: Diagnosis not present

## 2015-03-29 DIAGNOSIS — E119 Type 2 diabetes mellitus without complications: Secondary | ICD-10-CM

## 2015-03-29 DIAGNOSIS — C187 Malignant neoplasm of sigmoid colon: Secondary | ICD-10-CM

## 2015-03-29 DIAGNOSIS — C787 Secondary malignant neoplasm of liver and intrahepatic bile duct: Secondary | ICD-10-CM | POA: Diagnosis not present

## 2015-03-29 DIAGNOSIS — C189 Malignant neoplasm of colon, unspecified: Secondary | ICD-10-CM

## 2015-03-29 LAB — COMPREHENSIVE METABOLIC PANEL (CC13)
ALK PHOS: 307 U/L — AB (ref 40–150)
ALT: 35 U/L (ref 0–55)
ANION GAP: 7 meq/L (ref 3–11)
AST: 61 U/L — ABNORMAL HIGH (ref 5–34)
Albumin: 3.4 g/dL — ABNORMAL LOW (ref 3.5–5.0)
BUN: 9.1 mg/dL (ref 7.0–26.0)
CO2: 24 mEq/L (ref 22–29)
Calcium: 8.8 mg/dL (ref 8.4–10.4)
Chloride: 107 mEq/L (ref 98–109)
Creatinine: 0.9 mg/dL (ref 0.7–1.3)
EGFR: 85 mL/min/{1.73_m2} — ABNORMAL LOW (ref >90)
Glucose: 118 mg/dl (ref 70–140)
Potassium: 3.4 mEq/L — ABNORMAL LOW (ref 3.5–5.1)
SODIUM: 137 meq/L (ref 136–145)
Total Bilirubin: 0.51 mg/dL (ref 0.20–1.20)
Total Protein: 8.2 g/dL (ref 6.4–8.3)

## 2015-03-29 LAB — CBC WITH DIFFERENTIAL/PLATELET
BASO%: 0.1 % (ref 0.0–2.0)
Basophils Absolute: 0 10*3/uL (ref 0.0–0.1)
EOS ABS: 0.2 10*3/uL (ref 0.0–0.5)
EOS%: 1.4 % (ref 0.0–7.0)
HCT: 31.3 % — ABNORMAL LOW (ref 38.4–49.9)
HGB: 9.9 g/dL — ABNORMAL LOW (ref 13.0–17.1)
LYMPH%: 11.8 % — ABNORMAL LOW (ref 14.0–49.0)
MCH: 26.3 pg — ABNORMAL LOW (ref 27.2–33.4)
MCHC: 31.6 g/dL — AB (ref 32.0–36.0)
MCV: 83.2 fL (ref 79.3–98.0)
MONO#: 0.9 10*3/uL (ref 0.1–0.9)
MONO%: 7.3 % (ref 0.0–14.0)
NEUT#: 9.8 10*3/uL — ABNORMAL HIGH (ref 1.5–6.5)
NEUT%: 79.4 % — ABNORMAL HIGH (ref 39.0–75.0)
PLATELETS: 114 10*3/uL — AB (ref 140–400)
RBC: 3.76 10*6/uL — ABNORMAL LOW (ref 4.20–5.82)
RDW: 16.8 % — AB (ref 11.0–14.6)
WBC: 12.3 10*3/uL — AB (ref 4.0–10.3)
lymph#: 1.5 10*3/uL (ref 0.9–3.3)

## 2015-03-29 NOTE — Telephone Encounter (Signed)
per pof to sch pt sch-sent MW email to sch trmt-pt to get updated sch on next appt

## 2015-03-29 NOTE — Progress Notes (Signed)
Jason Reilly OFFICE PROGRESS NOTE   Diagnosis:  Metastatic colon cancer  Oncology History   He is a Metastatic colon cancer to liver  Staging form: Colon and Rectum, AJCC 7th Edition  Clinical: Stage Unknown ([place], NX, M1) - Unsigned      Metastatic colon cancer to liver   10/28/2014 Tumor Marker AFP 3.2 CEA > 10,000 CA 19-9 12,929.6. tumor (-) KRAS and NRAS mutation.    11/10/2014 Imaging PET scan showed hypermetabolic mass in the sigmoid colon was noted metastasis in the retroperitoneum. Probable left adrenal and pulmonary metastasis, and diffuse liver metastasis.   11/21/2014 Initial Diagnosis Metastatic colon cancer to liver, lung, abd nodes and left adrenal gland. Diagnosis was made by liver biopsy.    11/30/2014 -  Chemotherapy First line chemo [id number], Panitumumab added second cycle    12/28/2014 - 12/31/2014 Hospital Admission Was admitted for dehydration, neutropenia fever with UTI, and severe skin rash.   02/08/2015 - 02/12/2015 Hospital Admission He was admitted for upper GI bleeding, e.g. showed a gastric ulcer with clots, status post epinephrine injection. He also received a blood transfusion.         INTERVAL HISTORY:   Jason Reilly returns as scheduled. Treatment was resumed with FOLFOX (no 5-FU bolus) on 03/15/2015. He denies nausea/vomiting. No mouth sores. No diarrhea. No rash. He denies any numbness or tingling in his hands or feet. He denies pain. No bleeding. He denies shortness of breath.  Objective:  Vital signs in last 24 hours:  Blood pressure 134/71, pulse 72, temperature 98 F (36.7 C), temperature source Oral, resp. rate 18, height 5' 4.25" (1.632 m), weight 141 lb 14.4 oz (64.365 kg), SpO2 100 %.    HEENT: No thrush or ulcers. Lymphatics: No palpable cervical or supraclavicular lymph nodes. Resp: Lungs clear bilaterally. Cardio: Regular rate and rhythm. GI: Abdomen soft and nontender. No hepatomegaly. Vascular: No  leg edema. Calves soft and nontender. Neuro: Vibratory sense intact over the fingertips per tuning fork exam.  Skin: No rash. Port-A-Cath without erythema.    Lab Results:  Lab Results  Component Value Date   WBC 12.3* 03/29/2015   HGB 9.9* 03/29/2015   HCT 31.3* 03/29/2015   MCV 83.2 03/29/2015   PLT 114* 03/29/2015   NEUTROABS 9.8* 03/29/2015    Imaging:  No results found.  Medications: I have reviewed the patient's current medications.  Assessment/Plan: 1. Metastatic sigmoid colon cancer, with diffuse liver, lungs, node and left adrenal gland metastases. KRAS/NRAS wild type, MSI stable.   FOLFOX initiated 11/30/2014;   Cycle 2 FOLFOX with PANITUMUMAB added 12/14/2014.   Hospitalized with dehydration, febrile neutropenia and a severe skin rash 12/28/2014 through 12/31/2014.   Cycle 3 FOLFOX 01/11/2015, PANITUMUMAB held; Neulasta support on day 3.   Cycle 4 FOLFOX with dose reduced PANITUMUMAB 02/02/2015;   Hospitalization with upper GI bleed with findings of a gastric ulcer 02/08/2015 through 02/12/2015.   CEA and CA-19-9 improved 02/28/2015.   Restaging CT evaluation 03/13/2015 with slight decrease in prominence of masslike thickening involving the sigmoid colon, slight decrease in size of hepatic metastatic masses and left adrenal metastasis. Slight decrease in size of a lymph node in the sigmoid mesentery. Stable left periaortic lymph node. New or increased calcification involving the body of the right adrenal gland.   Cycle 5 FOLFOX 03/15/2015 with no 5-FU bolus. 2. Gastric ulcer with significant GI bleed April 2016 3. Diabetes 4. Hypertension, dilated nonischemic cardiomyopathy 5. Hepatitis B carrier   Disposition: Mr.  Reilly appears stable. He has completed 5 cycles of FOLFOX. Plan to proceed with cycle 6 as scheduled 03/30/2015. He will return for a follow-up visit and cycle 7 in 2 weeks. He will contact the office in the interim with any  problems.    Jason Reilly ANP/GNP-BC   03/29/2015  1:55 PM

## 2015-03-30 ENCOUNTER — Other Ambulatory Visit: Payer: Self-pay | Admitting: Hematology

## 2015-03-30 ENCOUNTER — Ambulatory Visit (HOSPITAL_BASED_OUTPATIENT_CLINIC_OR_DEPARTMENT_OTHER): Payer: Medicare Other

## 2015-03-30 ENCOUNTER — Telehealth: Payer: Self-pay | Admitting: *Deleted

## 2015-03-30 VITALS — BP 163/73 | HR 70 | Temp 98.3°F | Resp 18

## 2015-03-30 DIAGNOSIS — C7972 Secondary malignant neoplasm of left adrenal gland: Secondary | ICD-10-CM | POA: Diagnosis not present

## 2015-03-30 DIAGNOSIS — C787 Secondary malignant neoplasm of liver and intrahepatic bile duct: Secondary | ICD-10-CM | POA: Diagnosis not present

## 2015-03-30 DIAGNOSIS — Z5111 Encounter for antineoplastic chemotherapy: Secondary | ICD-10-CM

## 2015-03-30 DIAGNOSIS — C189 Malignant neoplasm of colon, unspecified: Secondary | ICD-10-CM

## 2015-03-30 DIAGNOSIS — C187 Malignant neoplasm of sigmoid colon: Secondary | ICD-10-CM | POA: Diagnosis not present

## 2015-03-30 LAB — CANCER ANTIGEN 19-9: CA 19-9: 308 U/mL — ABNORMAL HIGH (ref <35.0)

## 2015-03-30 LAB — CEA: CEA: 556.6 ng/mL — ABNORMAL HIGH (ref 0.0–5.0)

## 2015-03-30 MED ORDER — FLUOROURACIL CHEMO INJECTION 5 GM/100ML
2400.0000 mg/m2 | INTRAVENOUS | Status: DC
  Administered 2015-03-30: 3750 mg via INTRAVENOUS
  Filled 2015-03-30: qty 75

## 2015-03-30 MED ORDER — DEXTROSE 5 % IV SOLN
Freq: Once | INTRAVENOUS | Status: AC
  Administered 2015-03-30: 14:00:00 via INTRAVENOUS

## 2015-03-30 MED ORDER — DEXTROSE 5 % IV SOLN
404.0000 mg/m2 | Freq: Once | INTRAVENOUS | Status: AC
  Administered 2015-03-30: 630 mg via INTRAVENOUS
  Filled 2015-03-30: qty 31.5

## 2015-03-30 MED ORDER — OXALIPLATIN CHEMO INJECTION 100 MG/20ML
85.0000 mg/m2 | Freq: Once | INTRAVENOUS | Status: AC
  Administered 2015-03-30: 135 mg via INTRAVENOUS
  Filled 2015-03-30: qty 27

## 2015-03-30 MED ORDER — SODIUM CHLORIDE 0.9 % IV SOLN
Freq: Once | INTRAVENOUS | Status: AC
  Administered 2015-03-30: 14:00:00 via INTRAVENOUS
  Filled 2015-03-30: qty 4

## 2015-03-30 NOTE — Telephone Encounter (Signed)
Per staff message and POF I have scheduled appts. Advised scheduler of appts. JMW  

## 2015-03-30 NOTE — Progress Notes (Signed)
Oncology Nurse Navigator Documentation  Oncology Nurse Navigator Flowsheets 03/30/2015  Navigator Encounter Type Treatment  Patient Visit Type Medonc-  Barriers/Navigation Needs No barriers at this time-interpreter present at appointments. He reports feeling stronger and eating well. No difficulty getting his meds. Has his appointments. Made him aware of improvement in tumor markers.  Time Spent with Patient 10

## 2015-03-30 NOTE — Patient Instructions (Signed)
[facility] Discharge Instructions for Patients Receiving Chemotherapy  Today you received the following chemotherapy agents Oxaliplatin, Leucovorin, 5-FU  To help prevent nausea and vomiting after your treatment, we encourage you to take your nausea medication     If you develop nausea and vomiting that is not controlled by your nausea medication, call the clinic.   BELOW ARE SYMPTOMS THAT SHOULD BE REPORTED IMMEDIATELY:  *FEVER GREATER THAN 100.5 F  *CHILLS WITH OR WITHOUT FEVER  NAUSEA AND VOMITING THAT IS NOT CONTROLLED WITH YOUR NAUSEA MEDICATION  *UNUSUAL SHORTNESS OF BREATH  *UNUSUAL BRUISING OR BLEEDING  TENDERNESS IN MOUTH AND THROAT WITH OR WITHOUT PRESENCE OF ULCERS  *URINARY PROBLEMS  *BOWEL PROBLEMS  UNUSUAL RASH Items with * indicate a potential emergency and should be followed up as soon as possible.  Feel free to call the clinic you have any questions or concerns. The clinic phone number is [phone].  Please show the [place] at check-in to the Emergency Department and triage nurse.

## 2015-04-01 ENCOUNTER — Ambulatory Visit (HOSPITAL_BASED_OUTPATIENT_CLINIC_OR_DEPARTMENT_OTHER): Payer: Medicare Other

## 2015-04-01 VITALS — BP 125/70 | HR 107 | Temp 98.0°F | Resp 18

## 2015-04-01 DIAGNOSIS — C189 Malignant neoplasm of colon, unspecified: Secondary | ICD-10-CM | POA: Diagnosis present

## 2015-04-01 DIAGNOSIS — Z5189 Encounter for other specified aftercare: Secondary | ICD-10-CM

## 2015-04-01 DIAGNOSIS — C787 Secondary malignant neoplasm of liver and intrahepatic bile duct: Secondary | ICD-10-CM

## 2015-04-01 MED ORDER — HEPARIN SOD (PORK) LOCK FLUSH 100 UNIT/ML IV SOLN
500.0000 [IU] | Freq: Once | INTRAVENOUS | Status: DC | PRN
Start: 2015-04-01 — End: 2015-04-01
  Filled 2015-04-01: qty 5

## 2015-04-01 MED ORDER — SODIUM CHLORIDE 0.9 % IJ SOLN
10.0000 mL | INTRAMUSCULAR | Status: DC | PRN
  Filled 2015-04-01: qty 10

## 2015-04-01 MED ORDER — SODIUM CHLORIDE 0.9 % IJ SOLN
10.0000 mL | INTRAMUSCULAR | Status: DC | PRN
  Administered 2015-04-01: 10 mL via INTRAVENOUS
  Filled 2015-04-01: qty 10

## 2015-04-01 MED ORDER — PEGFILGRASTIM INJECTION 6 MG/0.6ML ~~LOC~~
6.0000 mg | PREFILLED_SYRINGE | Freq: Once | SUBCUTANEOUS | Status: AC
  Administered 2015-04-01: 6 mg via SUBCUTANEOUS

## 2015-04-01 MED ORDER — HEPARIN SOD (PORK) LOCK FLUSH 100 UNIT/ML IV SOLN
500.0000 [IU] | Freq: Once | INTRAVENOUS | Status: AC
  Administered 2015-04-01: 500 [IU] via INTRAVENOUS
  Filled 2015-04-01: qty 5

## 2015-04-01 NOTE — Progress Notes (Signed)
Pt in for pump disconnect, attempted to get him to return as scheduled. Pt reports via interpreter he will not have transportation to center to have pump disconnected at [facility]. 49mL Fluorouracil remaining to be infused. Pump disconnected per pt request.

## 2015-04-01 NOTE — Patient Instructions (Signed)
Pegfilgrastim injection What is this medicine? PEGFILGRASTIM (peg fil GRA stim) is a long-acting granulocyte colony-stimulating factor that stimulates the growth of neutrophils, a type of white blood cell important in the body's fight against infection. It is used to reduce the incidence of fever and infection in patients with certain types of cancer who are receiving chemotherapy that affects the bone marrow. This medicine may be used for other purposes; ask your health care provider or pharmacist if you have questions. COMMON BRAND NAME(S): Neulasta What should I tell my health care provider before I take this medicine? They need to know if you have any of these conditions: -latex allergy -ongoing radiation therapy -sickle cell disease -skin reactions to acrylic adhesives (On-Body Injector only) -an unusual or allergic reaction to pegfilgrastim, filgrastim, other medicines, foods, dyes, or preservatives -pregnant or trying to get pregnant -breast-feeding How should I use this medicine? This medicine is for injection under the skin. If you get this medicine at home, you will be taught how to prepare and give the pre-filled syringe or how to use the On-body Injector. Refer to the patient Instructions for Use for detailed instructions. Use exactly as directed. Take your medicine at regular intervals. Do not take your medicine more often than directed. It is important that you put your used needles and syringes in a special sharps container. Do not put them in a trash can. If you do not have a sharps container, call your pharmacist or healthcare provider to get one. Talk to your pediatrician regarding the use of this medicine in children. Special care may be needed. Overdosage: If you think you have taken too much of this medicine contact a poison control center or emergency room at once. NOTE: This medicine is only for you. Do not share this medicine with others. What if I miss a dose? It is  important not to miss your dose. Call your doctor or health care professional if you miss your dose. If you miss a dose due to an On-body Injector failure or leakage, a new dose should be administered as soon as possible using a single prefilled syringe for manual use. What may interact with this medicine? Interactions have not been studied. Give your health care provider a list of all the medicines, herbs, non-prescription drugs, or dietary supplements you use. Also tell them if you smoke, drink alcohol, or use illegal drugs. Some items may interact with your medicine. This list may not describe all possible interactions. Give your health care provider a list of all the medicines, herbs, non-prescription drugs, or dietary supplements you use. Also tell them if you smoke, drink alcohol, or use illegal drugs. Some items may interact with your medicine. What should I watch for while using this medicine? You may need blood work done while you are taking this medicine. If you are going to need a MRI, CT scan, or other procedure, tell your doctor that you are using this medicine (On-Body Injector only). What side effects may I notice from receiving this medicine? Side effects that you should report to your doctor or health care professional as soon as possible: -allergic reactions like skin rash, itching or hives, swelling of the face, lips, or tongue -dizziness -fever -pain, redness, or irritation at site where injected -pinpoint red spots on the skin -shortness of breath or breathing problems -stomach or side pain, or pain at the shoulder -swelling -tiredness -trouble passing urine Side effects that usually do not require medical attention (report to your doctor   or health care professional if they continue or are bothersome): -bone pain -muscle pain This list may not describe all possible side effects. Call your doctor for medical advice about side effects. You may report side effects to FDA at  1-800-FDA-1088. Where should I keep my medicine? Keep out of the reach of children. Store pre-filled syringes in a refrigerator between 2 and 8 degrees C (36 and 46 degrees F). Do not freeze. Keep in carton to protect from light. Throw away this medicine if it is left out of the refrigerator for more than 48 hours. Throw away any unused medicine after the expiration date. NOTE: This sheet is a summary. It may not cover all possible information. If you have questions about this medicine, talk to your doctor, pharmacist, or health care provider.  2015, Elsevier/Gold Standard. (2014-01-13 16:14:05)  

## 2015-04-03 ENCOUNTER — Telehealth: Payer: Self-pay | Admitting: *Deleted

## 2015-04-03 MED ORDER — PROCHLORPERAZINE MALEATE 10 MG PO TABS: 10.0000 mg | ORAL_TABLET | Freq: Four times a day (QID) | ORAL | Status: DC | PRN

## 2015-04-03 NOTE — Telephone Encounter (Signed)
Message from [place], pt's daughter in law reporting pt has been "throwing up for 2 days." Returned call to pt's son, he reports pt has been taking an antiemetic with no relief. Unsure of which med he has been taking, he is not with pt. Requested I call pt at home.  Called pt via [employer] # [id number]. Spoke with wife, she reports pt vomits after eating and drinking. Has not been able to keep soup or water down. Took 2 doses of Zofran. (last night and this morning, he threw up both doses.) She prepared ginger and honey for him to drink about 15 minutes ago and will see if this helps.  Mrs. Hyams also reports she "thought the wind had invaded him," so she used the "coning procedure." She reports pt has bruising "all over" from this.  Instructed her to try Zofran now, will call in new nausea med to pharmacy. Reviewed above with Drue Second, NP: Order received for Compazine 10 mg Q 6 hours PRN.  Compazine will be called in to pharmacy. She requests Rx to go to [place] on [address]. Same done.

## 2015-04-04 ENCOUNTER — Encounter: Payer: Self-pay | Admitting: *Deleted

## 2015-04-04 NOTE — Progress Notes (Signed)
Called to check on pt via Interpreter [phone] with [employer]. Wife reports pt feels better, is taking compazine, drinking ginger and honey, and performing traditional "coining" procedure. No questions or concerns at this time. Wife states she will call us if vomiting returns. Number to [facility] given, wife appreciated call.

## 2015-04-12 ENCOUNTER — Ambulatory Visit (HOSPITAL_BASED_OUTPATIENT_CLINIC_OR_DEPARTMENT_OTHER): Payer: Medicare Other

## 2015-04-12 ENCOUNTER — Other Ambulatory Visit (HOSPITAL_BASED_OUTPATIENT_CLINIC_OR_DEPARTMENT_OTHER): Payer: Medicare Other

## 2015-04-12 ENCOUNTER — Encounter: Payer: Self-pay | Admitting: Physician Assistant

## 2015-04-12 ENCOUNTER — Ambulatory Visit (HOSPITAL_BASED_OUTPATIENT_CLINIC_OR_DEPARTMENT_OTHER): Payer: Medicare Other | Admitting: Physician Assistant

## 2015-04-12 ENCOUNTER — Telehealth: Payer: Self-pay | Admitting: Physician Assistant

## 2015-04-12 ENCOUNTER — Other Ambulatory Visit: Payer: Self-pay | Admitting: Hematology

## 2015-04-12 VITALS — BP 155/89 | HR 86 | Temp 98.5°F | Resp 18 | Ht 64.25 in | Wt 139.9 lb

## 2015-04-12 DIAGNOSIS — C187 Malignant neoplasm of sigmoid colon: Secondary | ICD-10-CM

## 2015-04-12 DIAGNOSIS — C787 Secondary malignant neoplasm of liver and intrahepatic bile duct: Secondary | ICD-10-CM | POA: Diagnosis not present

## 2015-04-12 DIAGNOSIS — C7972 Secondary malignant neoplasm of left adrenal gland: Secondary | ICD-10-CM

## 2015-04-12 DIAGNOSIS — I10 Essential (primary) hypertension: Secondary | ICD-10-CM

## 2015-04-12 DIAGNOSIS — C189 Malignant neoplasm of colon, unspecified: Secondary | ICD-10-CM

## 2015-04-12 DIAGNOSIS — C7800 Secondary malignant neoplasm of unspecified lung: Secondary | ICD-10-CM

## 2015-04-12 DIAGNOSIS — E119 Type 2 diabetes mellitus without complications: Secondary | ICD-10-CM | POA: Diagnosis not present

## 2015-04-12 DIAGNOSIS — Z5111 Encounter for antineoplastic chemotherapy: Secondary | ICD-10-CM

## 2015-04-12 LAB — COMPREHENSIVE METABOLIC PANEL (CC13)
ALT: 30 U/L (ref 0–55)
ANION GAP: 8 meq/L (ref 3–11)
AST: 49 U/L — ABNORMAL HIGH (ref 5–34)
Albumin: 3.1 g/dL — ABNORMAL LOW (ref 3.5–5.0)
Alkaline Phosphatase: 329 U/L — ABNORMAL HIGH (ref 40–150)
BUN: 6.3 mg/dL — ABNORMAL LOW (ref 7.0–26.0)
CO2: 21 meq/L — AB (ref 22–29)
CREATININE: 1 mg/dL (ref 0.7–1.3)
Calcium: 8.5 mg/dL (ref 8.4–10.4)
Chloride: 108 mEq/L (ref 98–109)
EGFR: 77 mL/min/{1.73_m2} — AB (ref >90)
GLUCOSE: 268 mg/dL — AB (ref 70–140)
Potassium: 3.8 mEq/L (ref 3.5–5.1)
Sodium: 136 mEq/L (ref 136–145)
Total Bilirubin: 0.45 mg/dL (ref 0.20–1.20)
Total Protein: 7.9 g/dL (ref 6.4–8.3)

## 2015-04-12 LAB — CBC WITH DIFFERENTIAL/PLATELET
BASO%: 0.2 % (ref 0.0–2.0)
BASOS ABS: 0 10*3/uL (ref 0.0–0.1)
EOS ABS: 0.1 10*3/uL (ref 0.0–0.5)
EOS%: 1 % (ref 0.0–7.0)
HCT: 29.8 % — ABNORMAL LOW (ref 38.4–49.9)
HGB: 9.4 g/dL — ABNORMAL LOW (ref 13.0–17.1)
LYMPH#: 1.2 10*3/uL (ref 0.9–3.3)
LYMPH%: 11 % — ABNORMAL LOW (ref 14.0–49.0)
MCH: 25.8 pg — ABNORMAL LOW (ref 27.2–33.4)
MCHC: 31.5 g/dL — ABNORMAL LOW (ref 32.0–36.0)
MCV: 81.6 fL (ref 79.3–98.0)
MONO#: 0.9 10*3/uL (ref 0.1–0.9)
MONO%: 8.9 % (ref 0.0–14.0)
NEUT#: 8.4 10*3/uL — ABNORMAL HIGH (ref 1.5–6.5)
NEUT%: 78.9 % — ABNORMAL HIGH (ref 39.0–75.0)
Platelets: 137 10*3/uL — ABNORMAL LOW (ref 140–400)
RBC: 3.65 10*6/uL — AB (ref 4.20–5.82)
RDW: 18.1 % — AB (ref 11.0–14.6)
WBC: 10.6 10*3/uL — ABNORMAL HIGH (ref 4.0–10.3)

## 2015-04-12 MED ORDER — OXALIPLATIN CHEMO INJECTION 100 MG/20ML
85.0000 mg/m2 | Freq: Once | INTRAVENOUS | Status: AC
  Administered 2015-04-12: 135 mg via INTRAVENOUS
  Filled 2015-04-12: qty 27

## 2015-04-12 MED ORDER — LEUCOVORIN CALCIUM INJECTION 350 MG
398.0000 mg/m2 | Freq: Once | INTRAMUSCULAR | Status: AC
  Administered 2015-04-12: 620 mg via INTRAVENOUS
  Filled 2015-04-12: qty 31

## 2015-04-12 MED ORDER — SODIUM CHLORIDE 0.9 % IV SOLN
Freq: Once | INTRAVENOUS | Status: AC
  Administered 2015-04-12: 12:00:00 via INTRAVENOUS
  Filled 2015-04-12: qty 4

## 2015-04-12 MED ORDER — SODIUM CHLORIDE 0.9 % IV SOLN
2400.0000 mg/m2 | INTRAVENOUS | Status: DC
  Administered 2015-04-12: 3750 mg via INTRAVENOUS
  Filled 2015-04-12: qty 75

## 2015-04-12 MED ORDER — DEXTROSE 5 % IV SOLN
Freq: Once | INTRAVENOUS | Status: AC
  Administered 2015-04-12: 12:00:00 via INTRAVENOUS

## 2015-04-12 NOTE — Patient Instructions (Signed)
[facility] Discharge Instructions for Patients Receiving Chemotherapy  Today you received the following chemotherapy agents Oxaliplatin, Leucovorin, 5-FU  To help prevent nausea and vomiting after your treatment, we encourage you to take your nausea medication     If you develop nausea and vomiting that is not controlled by your nausea medication, call the clinic.   BELOW ARE SYMPTOMS THAT SHOULD BE REPORTED IMMEDIATELY:  *FEVER GREATER THAN 100.5 F  *CHILLS WITH OR WITHOUT FEVER  NAUSEA AND VOMITING THAT IS NOT CONTROLLED WITH YOUR NAUSEA MEDICATION  *UNUSUAL SHORTNESS OF BREATH  *UNUSUAL BRUISING OR BLEEDING  TENDERNESS IN MOUTH AND THROAT WITH OR WITHOUT PRESENCE OF ULCERS  *URINARY PROBLEMS  *BOWEL PROBLEMS  UNUSUAL RASH Items with * indicate a potential emergency and should be followed up as soon as possible.  Feel free to call the clinic you have any questions or concerns. The clinic phone number is [phone].  Please show the [place] at check-in to the Emergency Department and triage nurse.

## 2015-04-12 NOTE — Progress Notes (Signed)
[facility] OFFICE PROGRESS NOTE   Diagnosis:  Metastatic colon cancer  Oncology History   He is a Metastatic colon cancer to liver  Staging form: Colon and Rectum, AJCC 7th Edition  Clinical: Stage Unknown ([place], NX, M1) - Unsigned      Metastatic colon cancer to liver   10/28/2014 Tumor Marker AFP 3.2 CEA > 10,000 CA 19-9 12,929.6. tumor (-) KRAS and NRAS mutation.    11/10/2014 Imaging PET scan showed hypermetabolic mass in the sigmoid colon was noted metastasis in the retroperitoneum. Probable left adrenal and pulmonary metastasis, and diffuse liver metastasis.   11/21/2014 Initial Diagnosis Metastatic colon cancer to liver, lung, abd nodes and left adrenal gland. Diagnosis was made by liver biopsy.    11/30/2014 -  Chemotherapy First line chemo [id number], Panitumumab added second cycle    12/28/2014 - 12/31/2014 Hospital Admission Was admitted for dehydration, neutropenia fever with UTI, and severe skin rash.   02/08/2015 - 02/12/2015 Hospital Admission He was admitted for upper GI bleeding, e.g. showed a gastric ulcer with clots, status post epinephrine injection. He also received a blood transfusion.         INTERVAL HISTORY:   Jason Reilly returns as scheduled. Treatment was resumed with FOLFOX (no 5-FU bolus) on 03/15/2015. He reports some nausea and vomiting after his pump is discontinued. His current anti-emetic medication is helpful for these symptoms. He reports his appetite is good and he is sleeping well. He denies any pain. He reports he felt a little feverish for about 1 day. He did not take his temperature. The symptoms resolved and he is had no subsequent similar symptoms.  No mouth sores. No diarrhea. No rash. He denies any numbness or tingling in his hands or feet. He denies pain. No bleeding. He denies shortness of breath.  Objective:  Vital signs in last 24 hours:  Blood pressure 155/89, pulse 86, temperature 98.5 F (36.9 C),  temperature source Oral, resp. rate 18, height 5' 4.25" (1.632 m), weight 139 lb 14.4 oz (63.458 kg), SpO2 100 %.    HEENT: No thrush or ulcers. Lymphatics: No palpable cervical or supraclavicular lymph nodes. Resp: Lungs clear bilaterally. Cardio: Regular rate and rhythm. GI: Abdomen soft and nontender. No hepatomegaly. Vascular: No leg edema. Calves soft and nontender. Neuro: Vibratory sense intact over the fingertips per tuning fork exam, stable  Skin: No rash. Port-A-Cath without erythema.    Lab Results:  Lab Results  Component Value Date   WBC 10.6* 04/12/2015   HGB 9.4* 04/12/2015   HCT 29.8* 04/12/2015   MCV 81.6 04/12/2015   PLT 137* 04/12/2015   NEUTROABS 8.4* 04/12/2015    Imaging:  No results found.  Medications: I have reviewed the patient's current medications.  Assessment/Plan: 1. Metastatic sigmoid colon cancer, with diffuse liver, lungs, node and left adrenal gland metastases. KRAS/NRAS wild type, MSI stable.   FOLFOX initiated 11/30/2014;   Cycle 2 FOLFOX with PANITUMUMAB added 12/14/2014.   Hospitalized with dehydration, febrile neutropenia and a severe skin rash 12/28/2014 through 12/31/2014.   Cycle 3 FOLFOX 01/11/2015, PANITUMUMAB held; Neulasta support on day 3.   Cycle 4 FOLFOX with dose reduced PANITUMUMAB 02/02/2015;   Hospitalization with upper GI bleed with findings of a gastric ulcer 02/08/2015 through 02/12/2015.   CEA and CA-19-9 improved 02/28/2015.   Restaging CT evaluation 03/13/2015 with slight decrease in prominence of masslike thickening involving the sigmoid colon, slight decrease in size of hepatic metastatic masses and left adrenal metastasis. Slight  decrease in size of a lymph node in the sigmoid mesentery. Stable left periaortic lymph node. New or increased calcification involving the body of the right adrenal gland.   Cycle 5 FOLFOX 03/15/2015 with no 5-FU bolus. 2. Gastric ulcer with significant GI bleed April  2016 3. Diabetes 4. Hypertension, dilated nonischemic cardiomyopathy 5. Hepatitis B carrier   Disposition: Jason Reilly appears stable. He has completed 6 cycles of FOLFOX. Plan to proceed with cycle 7 as scheduled today 04/12/2015. He will return for a follow-up visit and cycle 8 in 2 weeks. He will contact the office in the interim with any problems.    Awilda Metro E PA-C  04/12/2015  3:49 PM

## 2015-04-12 NOTE — Telephone Encounter (Signed)
Pt confirmed labs/ov per 06/15 POF, gave pt AVS and Calendar..... KJ, added inj for 07/01

## 2015-04-14 ENCOUNTER — Ambulatory Visit: Payer: Medicare Other

## 2015-04-14 ENCOUNTER — Ambulatory Visit (HOSPITAL_BASED_OUTPATIENT_CLINIC_OR_DEPARTMENT_OTHER): Payer: Medicare Other

## 2015-04-14 VITALS — BP 133/92 | HR 98 | Temp 98.4°F | Resp 18

## 2015-04-14 DIAGNOSIS — C187 Malignant neoplasm of sigmoid colon: Secondary | ICD-10-CM | POA: Diagnosis not present

## 2015-04-14 DIAGNOSIS — Z452 Encounter for adjustment and management of vascular access device: Secondary | ICD-10-CM

## 2015-04-14 DIAGNOSIS — Z5189 Encounter for other specified aftercare: Secondary | ICD-10-CM | POA: Diagnosis not present

## 2015-04-14 DIAGNOSIS — C787 Secondary malignant neoplasm of liver and intrahepatic bile duct: Principal | ICD-10-CM

## 2015-04-14 DIAGNOSIS — C189 Malignant neoplasm of colon, unspecified: Secondary | ICD-10-CM

## 2015-04-14 MED ORDER — HEPARIN SOD (PORK) LOCK FLUSH 100 UNIT/ML IV SOLN
500.0000 [IU] | Freq: Once | INTRAVENOUS | Status: AC | PRN
  Administered 2015-04-14: 500 [IU]
  Filled 2015-04-14: qty 5

## 2015-04-14 MED ORDER — PEGFILGRASTIM INJECTION 6 MG/0.6ML ~~LOC~~
6.0000 mg | PREFILLED_SYRINGE | Freq: Once | SUBCUTANEOUS | Status: AC
  Administered 2015-04-14: 6 mg via SUBCUTANEOUS
  Filled 2015-04-14: qty 0.6

## 2015-04-14 MED ORDER — SODIUM CHLORIDE 0.9 % IJ SOLN
10.0000 mL | INTRAMUSCULAR | Status: DC | PRN
  Administered 2015-04-14: 10 mL
  Filled 2015-04-14: qty 10

## 2015-04-14 NOTE — Progress Notes (Signed)
Neulasta injection given by [facility] nurse after pump DC'd

## 2015-04-14 NOTE — Patient Instructions (Signed)
Follow-up in 2 weeks prior to your next scheduled cycle of chemotherapy

## 2015-04-14 NOTE — Patient Instructions (Signed)
Pegfilgrastim injection What is this medicine? PEGFILGRASTIM (peg fil GRA stim) is a long-acting granulocyte colony-stimulating factor that stimulates the growth of neutrophils, a type of white blood cell important in the body's fight against infection. It is used to reduce the incidence of fever and infection in patients with certain types of cancer who are receiving chemotherapy that affects the bone marrow. This medicine may be used for other purposes; ask your health care provider or pharmacist if you have questions. COMMON BRAND NAME(S): Neulasta What should I tell my health care provider before I take this medicine? They need to know if you have any of these conditions: -latex allergy -ongoing radiation therapy -sickle cell disease -skin reactions to acrylic adhesives (On-Body Injector only) -an unusual or allergic reaction to pegfilgrastim, filgrastim, other medicines, foods, dyes, or preservatives -pregnant or trying to get pregnant -breast-feeding How should I use this medicine? This medicine is for injection under the skin. If you get this medicine at home, you will be taught how to prepare and give the pre-filled syringe or how to use the On-body Injector. Refer to the patient Instructions for Use for detailed instructions. Use exactly as directed. Take your medicine at regular intervals. Do not take your medicine more often than directed. It is important that you put your used needles and syringes in a special sharps container. Do not put them in a trash can. If you do not have a sharps container, call your pharmacist or healthcare provider to get one. Talk to your pediatrician regarding the use of this medicine in children. Special care may be needed. Overdosage: If you think you have taken too much of this medicine contact a poison control center or emergency room at once. NOTE: This medicine is only for you. Do not share this medicine with others. What if I miss a dose? It is  important not to miss your dose. Call your doctor or health care professional if you miss your dose. If you miss a dose due to an On-body Injector failure or leakage, a new dose should be administered as soon as possible using a single prefilled syringe for manual use. What may interact with this medicine? Interactions have not been studied. Give your health care provider a list of all the medicines, herbs, non-prescription drugs, or dietary supplements you use. Also tell them if you smoke, drink alcohol, or use illegal drugs. Some items may interact with your medicine. This list may not describe all possible interactions. Give your health care provider a list of all the medicines, herbs, non-prescription drugs, or dietary supplements you use. Also tell them if you smoke, drink alcohol, or use illegal drugs. Some items may interact with your medicine. What should I watch for while using this medicine? You may need blood work done while you are taking this medicine. If you are going to need a MRI, CT scan, or other procedure, tell your doctor that you are using this medicine (On-Body Injector only). What side effects may I notice from receiving this medicine? Side effects that you should report to your doctor or health care professional as soon as possible: -allergic reactions like skin rash, itching or hives, swelling of the face, lips, or tongue -dizziness -fever -pain, redness, or irritation at site where injected -pinpoint red spots on the skin -shortness of breath or breathing problems -stomach or side pain, or pain at the shoulder -swelling -tiredness -trouble passing urine Side effects that usually do not require medical attention (report to your doctor   or health care professional if they continue or are bothersome): -bone pain -muscle pain This list may not describe all possible side effects. Call your doctor for medical advice about side effects. You may report side effects to FDA at  1-800-FDA-1088. Where should I keep my medicine? Keep out of the reach of children. Store pre-filled syringes in a refrigerator between 2 and 8 degrees C (36 and 46 degrees F). Do not freeze. Keep in carton to protect from light. Throw away this medicine if it is left out of the refrigerator for more than 48 hours. Throw away any unused medicine after the expiration date. NOTE: This sheet is a summary. It may not cover all possible information. If you have questions about this medicine, talk to your doctor, pharmacist, or health care provider.  2015, Elsevier/Gold Standard. (2014-01-13 16:14:05)  

## 2015-04-26 ENCOUNTER — Ambulatory Visit (HOSPITAL_BASED_OUTPATIENT_CLINIC_OR_DEPARTMENT_OTHER): Payer: Medicare Other

## 2015-04-26 ENCOUNTER — Ambulatory Visit (HOSPITAL_BASED_OUTPATIENT_CLINIC_OR_DEPARTMENT_OTHER): Payer: Medicare Other | Admitting: Hematology

## 2015-04-26 ENCOUNTER — Other Ambulatory Visit (HOSPITAL_BASED_OUTPATIENT_CLINIC_OR_DEPARTMENT_OTHER): Payer: Medicare Other

## 2015-04-26 ENCOUNTER — Telehealth: Payer: Self-pay | Admitting: Hematology

## 2015-04-26 ENCOUNTER — Encounter: Payer: Self-pay | Admitting: Hematology

## 2015-04-26 ENCOUNTER — Other Ambulatory Visit: Payer: Medicare Other

## 2015-04-26 ENCOUNTER — Ambulatory Visit: Payer: Medicare Other | Admitting: Nurse Practitioner

## 2015-04-26 VITALS — BP 140/77 | HR 83 | Temp 98.0°F | Resp 18 | Ht 64.25 in | Wt 142.3 lb

## 2015-04-26 DIAGNOSIS — C787 Secondary malignant neoplasm of liver and intrahepatic bile duct: Secondary | ICD-10-CM

## 2015-04-26 DIAGNOSIS — K254 Chronic or unspecified gastric ulcer with hemorrhage: Secondary | ICD-10-CM

## 2015-04-26 DIAGNOSIS — Z2251 Carrier of viral hepatitis B: Secondary | ICD-10-CM

## 2015-04-26 DIAGNOSIS — C7972 Secondary malignant neoplasm of left adrenal gland: Secondary | ICD-10-CM

## 2015-04-26 DIAGNOSIS — C189 Malignant neoplasm of colon, unspecified: Secondary | ICD-10-CM

## 2015-04-26 DIAGNOSIS — C187 Malignant neoplasm of sigmoid colon: Secondary | ICD-10-CM

## 2015-04-26 DIAGNOSIS — C7800 Secondary malignant neoplasm of unspecified lung: Secondary | ICD-10-CM

## 2015-04-26 DIAGNOSIS — C786 Secondary malignant neoplasm of retroperitoneum and peritoneum: Secondary | ICD-10-CM

## 2015-04-26 DIAGNOSIS — E119 Type 2 diabetes mellitus without complications: Secondary | ICD-10-CM

## 2015-04-26 DIAGNOSIS — Z5111 Encounter for antineoplastic chemotherapy: Secondary | ICD-10-CM | POA: Diagnosis present

## 2015-04-26 DIAGNOSIS — I10 Essential (primary) hypertension: Secondary | ICD-10-CM

## 2015-04-26 LAB — CBC WITH DIFFERENTIAL/PLATELET
BASO%: 1.1 % (ref 0.0–2.0)
BASOS ABS: 0.1 10*3/uL (ref 0.0–0.1)
EOS%: 2 % (ref 0.0–7.0)
Eosinophils Absolute: 0.2 10*3/uL (ref 0.0–0.5)
HCT: 30.5 % — ABNORMAL LOW (ref 38.4–49.9)
HEMOGLOBIN: 9.8 g/dL — AB (ref 13.0–17.1)
LYMPH%: 10.1 % — ABNORMAL LOW (ref 14.0–49.0)
MCH: 26 pg — AB (ref 27.2–33.4)
MCHC: 32.1 g/dL (ref 32.0–36.0)
MCV: 80.9 fL (ref 79.3–98.0)
MONO#: 1 10*3/uL — ABNORMAL HIGH (ref 0.1–0.9)
MONO%: 10.9 % (ref 0.0–14.0)
NEUT%: 75.9 % — ABNORMAL HIGH (ref 39.0–75.0)
NEUTROS ABS: 6.7 10*3/uL — AB (ref 1.5–6.5)
PLATELETS: 118 10*3/uL — AB (ref 140–400)
RBC: 3.76 10*6/uL — ABNORMAL LOW (ref 4.20–5.82)
RDW: 21.3 % — ABNORMAL HIGH (ref 11.0–14.6)
WBC: 8.8 10*3/uL (ref 4.0–10.3)
lymph#: 0.9 10*3/uL (ref 0.9–3.3)

## 2015-04-26 LAB — COMPREHENSIVE METABOLIC PANEL (CC13)
ALK PHOS: 298 U/L — AB (ref 40–150)
ALT: 33 U/L (ref 0–55)
AST: 50 U/L — AB (ref 5–34)
Albumin: 3.3 g/dL — ABNORMAL LOW (ref 3.5–5.0)
Anion Gap: 7 mEq/L (ref 3–11)
BUN: 8.5 mg/dL (ref 7.0–26.0)
CALCIUM: 8.9 mg/dL (ref 8.4–10.4)
CO2: 24 mEq/L (ref 22–29)
CREATININE: 1 mg/dL (ref 0.7–1.3)
Chloride: 108 mEq/L (ref 98–109)
EGFR: 80 mL/min/{1.73_m2} — AB (ref >90)
GLUCOSE: 248 mg/dL — AB (ref 70–140)
Potassium: 4 mEq/L (ref 3.5–5.1)
Sodium: 138 mEq/L (ref 136–145)
Total Bilirubin: 0.59 mg/dL (ref 0.20–1.20)
Total Protein: 8.1 g/dL (ref 6.4–8.3)

## 2015-04-26 MED ORDER — OXALIPLATIN CHEMO INJECTION 100 MG/20ML
85.0000 mg/m2 | Freq: Once | INTRAVENOUS | Status: AC
  Administered 2015-04-26: 135 mg via INTRAVENOUS
  Filled 2015-04-26: qty 27

## 2015-04-26 MED ORDER — SODIUM CHLORIDE 0.9 % IV SOLN
Freq: Once | INTRAVENOUS | Status: AC
  Administered 2015-04-26: 13:00:00 via INTRAVENOUS
  Filled 2015-04-26: qty 4

## 2015-04-26 MED ORDER — DEXTROSE 5 % IV SOLN
Freq: Once | INTRAVENOUS | Status: AC
  Administered 2015-04-26: 12:00:00 via INTRAVENOUS

## 2015-04-26 MED ORDER — SODIUM CHLORIDE 0.9 % IV SOLN
2400.0000 mg/m2 | INTRAVENOUS | Status: DC
  Administered 2015-04-26: 3750 mg via INTRAVENOUS
  Filled 2015-04-26: qty 75

## 2015-04-26 MED ORDER — DEXTROSE 5 % IV SOLN
398.0000 mg/m2 | Freq: Once | INTRAVENOUS | Status: AC
  Administered 2015-04-26: 620 mg via INTRAVENOUS
  Filled 2015-04-26: qty 31

## 2015-04-26 NOTE — Progress Notes (Signed)
[place]  [phone] Fax:([phone]) [place] Note   Patient Care Team: [name], MD as PCP - General (Specialist) [name], CRNP as Nurse Practitioner (Nurse Practitioner) Truitt Merle, MD as Consulting Physician (Hematology) 04/26/2015  CHIEF COMPLAINTS Metastatic colon cancer Oncology History   He is a Metastatic colon cancer to liver   Staging form: Colon and Rectum, AJCC 7th Edition     Clinical: Stage Unknown ([place], NX, M1) - Unsigned  Our health is     Metastatic colon cancer to liver   10/28/2014 Tumor Marker AFP 3.2 CEA > 10,000 CA 19-9 12,929.6. tumor (-) KRAS and NRAS mutation.    11/10/2014 Imaging PET scan showed hypermetabolic mass in the sigmoid colon was noted metastasis in the retroperitoneum. Probable left adrenal and pulmonary metastasis, and diffuse liver metastasis.   11/21/2014 Initial Diagnosis Metastatic colon cancer to liver, lung, abd nodes and left adrenal gland. Diagnosis was made by liver biopsy.    11/30/2014 -  Chemotherapy First line chemo mFOLFOX6, Panitumumab added from second cycle    12/28/2014 - 12/31/2014 Hospital Admission Was admitted for dehydration, neutropenia fever with UTI, and severe skin rash.   02/08/2015 - 02/12/2015 Hospital Admission He was admitted for upper GI bleeding, e.g. showed a gastric ulcer with clots, status post appendectomy injection. He also received a blood transfusion.   03/01/2015 Tumor Marker CEA 694, CA19.9 462   03/13/2015 Imaging CT CAP showed partial response, no new lesions.    03/15/2015 -  Chemotherapy restart FOLFOX     CURRENT THERAPY: mFOLFOX6 started on 11/30/2014, Panitumumab added to second cycle on 12/14/14, 50% dose reduction with cycle 4, then stopped due to possible AEs  HISTORY OF PRESENTING ILLNESS:  Jason Reilly 69 y.o. male is here because of abnormal CT findings, which is very suspicious for malignancy. He is on ranitidine from [place], has been on in the [place]  for 16 years. He came in with his son and an interpreter.  He has been feeling fatigued since two month ago. He is still able to do all ADLs. He otherwise denies any pain, bloating or nausea.  He lost about 20lbs in 3 month. His appetite is lower than before, eats less, no change of his bowl habits.  She denied any hematochezia or melana. Per his son, he has had some personality changes daily, irritable, slightly confused some time.  He was evaluated by his primary care physician. Lab test reviewed hepatitis B infection, which he did not know before, and elevated alkaline phosphatase, his liver function and the rest of the liver function was not remarkable. [place] of abdomen was obtained on 07/22/2014, which showed diffusely abnormal liver with multiple echogenic lesions. CT of abdomen with and without contrast was done on 08/26/2014, which reviewed here at a medically with multiple large partially calcified hepatic masses consistent with metastatic disease. Mild retroperitoneal adenopathy with the largest node measuring 1.6 cm. And nonspecific 1.4 cm left adrenal nodule was also noticed. His tumor marker showed CEA greater than 10,000, CA 19-9 12,929, AFP 3.2 (normal). He was referred to [facility] liver clinic and was evaluated by nurse practitioner [name]. Treatment for hepatitis B was not recommended based on his virus load.  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 He was evaluated by a cardiologist in 2014. He denies any significant dyspnea on exertion. No leg swollen.  INTERIM HISTORY: [name] returns for follow-up and cycle 8  chemo. He has tolerated the previous 2 cycle chemotherapy very well, no significant fatigue, anorexia, nausea, or other symptoms. He denies any melena or hematochezia. He has good appetite, weight is stable. Energy level is decent.    MEDICAL HISTORY:  Past Medical History  Diagnosis Date  . Tachycardia   .  Abnormal EKG   . Hypertension   . Non-ischemic cardiomyopathy   . Hepatitis B   . H. pylori infection   . Gastric ulcer   . Metastatic colon cancer to liver     SURGICAL HISTORY: Past Surgical History  Procedure Laterality Date  . Nuclear stress test  03/03/2013    High risk - consistent with nonischemic cardiomyopathy  . Left and right heart catheterization with coronary angiogram N/A 03/29/2013    Procedure: LEFT AND RIGHT HEART CATHETERIZATION WITH CORONARY ANGIOGRAM;  Surgeon: Pixie Casino, MD;  Location: [facility] C[facility] LAB;  Service: Cardiovascular;  Laterality: N/A;  . Esophagogastroduodenoscopy N/A 02/08/2015    Procedure: ESOPHAGOGASTRODUODENOSCOPY (EGD);  Surgeon: Ladene Artist, MD;  Location: [facility] ENDOSCOPY;  Service: Endoscopy;  Laterality: N/A;    SOCIAL HISTORY: History   Social History  . Marital Status: Married    Spouse Name: N/A  . Number of Children: N/A  . Years of Education: N/A   Occupational History  . Not on file.   Social History Main Topics  . Smoking status: Former Research scientist (life sciences)  . Smokeless tobacco: Never Used  . Alcohol Use: Yes     Comment: occasional  . Drug Use: No  . Sexual Activity: No   Other Topics Concern  . Not on file   Social History Narrative    FAMILY HISTORY: No family history of liver disease or malignancy.  ALLERGIES:  has No Known Allergies.  MEDICATIONS:  Current Outpatient Prescriptions on File Prior to Visit  Medication Sig Dispense Refill  . clindamycin (CLINDAGEL) 1 % gel Apply topically 2 (two) times daily. 30 g 2  . hydrocortisone 2.5 % cream Apply topically 2 (two) times daily. Once daily on face and twice daily on body 30 g 2  . lidocaine-prilocaine (EMLA) cream Apply 1 application topically as needed. Apply to [facility] cath at least one hour before needle stick. 30 g 2  . linagliptin (TRADJENTA) 5 MG TABS tablet Take 1 tablet (5 mg total) by mouth daily. 30 tablet 1  . lisinopril-hydrochlorothiazide (PRINZIDE,ZESTORETIC)  10-12.5 MG per tablet Take 1 tablet by mouth daily.  3  . loperamide (IMODIUM A-D) 2 MG tablet Take 2 mg by mouth 4 (four) times daily as needed for diarrhea or loose stools.    . metFORMIN (GLUCOPHAGE) 500 MG tablet Take by mouth 2 (two) times daily with a meal.    . mirtazapine (REMERON) 15 MG tablet Take 1 tablet (15 mg total) by mouth at bedtime. 30 tablet 2  . ondansetron (ZOFRAN) 8 MG tablet Take 1 tablet (8 mg total) by mouth every 8 (eight) hours as needed for nausea or vomiting. 45 tablet 0  . pantoprazole (PROTONIX) 40 MG tablet Take 1 tablet (40 mg total) by mouth daily. 30 tablet 11  . prochlorperazine (COMPAZINE) 10 MG tablet Take 1 tablet (10 mg total) by mouth every 6 (six) hours as needed for nausea or vomiting. 30 tablet 0   No current facility-administered medications on file prior to visit.  ;   REVIEW OF SYSTEMS:   Constitutional: Denies fevers, chills or abnormal night sweats, (+) fatigue  Eyes: Denies blurriness of vision, double vision  or watery eyes Ears, nose, mouth, throat, and face: Denies mucositis or sore throat Respiratory: Denies cough, dyspnea or wheezes Cardiovascular: Denies palpitation, chest discomfort or lower extremity swelling Gastrointestinal: Denies nausea, heartburn or change in bowel habits Skin: Denies abnormal skin rashes Lymphatics: Denies new lymphadenopathy or easy bruising Neurological:Denies numbness, tingling or new weaknesses Behavioral/Psych: Mood is stable, no new changes, (+) insomnia All other systems were reviewed with the patient and are negative.  PHYSICAL EXAMINATION: BP 140/77 mmHg  Pulse 83  Temp(Src) 98 F (36.7 C) (Oral)  Resp 18  Ht 5' 4.25" (1.632 m)  Wt 142 lb 4.8 oz (64.547 kg)  BMI 24.23 kg/m2  SpO2 100%  ECOG PERFORMANCE STATUS: 3 Vital sign were taken at in the infusion room, within normal limits. GENERAL:alert, no distress and comfortable SKIN: (+) Dry skin, scatter acne like skin rash es on the face, neck  and trunk, better than last time, no skin ulcer or discharge. EYES: normal, conjunctiva are pink and non-injected, sclera clear OROPHARYNX:no exudate, no erythema and lips, buccal mucosa, and tongue normal  NECK: supple, thyroid normal size, non-tender, without nodularity LYMPH:  no palpable lymphadenopathy in the cervical, axillary or inguinal LUNGS: clear to auscultation and percussion with normal breathing effort, (+) crackles on b/l lung base  HEART: regular rate & rhythm and no murmurs and no lower extremity edema ABDOMEN:abdomen soft, non-tender, no hepatomegaly, no splenomegaly and normal bowel sounds Musculoskeletal:no cyanosis of digits and no clubbing  PSYCH: alert & oriented x 3 with fluent speech NEURO: no focal motor/sensory deficits  LABORATORY DATA:  I have reviewed the data as listed CBC Latest Ref Rng 04/26/2015 04/12/2015 03/29/2015  WBC 4.0 - 10.3 10e3/uL 8.8 10.6(H) 12.3(H)  Hemoglobin 13.0 - 17.1 g/dL 9.8(L) 9.4(L) 9.9(L)  Hematocrit 38.4 - 49.9 % 30.5(L) 29.8(L) 31.3(L)  Platelets 140 - 400 10e3/uL 118(L) 137(L) 114(L)    CMP Latest Ref Rng 04/26/2015 04/12/2015 03/29/2015  Glucose 70 - 140 mg/dl 248(H) 268(H) 118  BUN 7.0 - 26.0 mg/dL 8.5 6.3(L) 9.1  Creatinine 0.7 - 1.3 mg/dL 1.0 1.0 0.9  Sodium 136 - 145 mEq/L 138 136 137  Potassium 3.5 - 5.1 mEq/L 4.0 3.8 3.4(L)  Chloride 96 - 112 mmol/L - - -  CO2 22 - 29 mEq/L 24 21(L) 24  Calcium 8.4 - 10.4 mg/dL 8.9 8.5 8.8  Total Protein 6.4 - 8.3 g/dL 8.1 7.9 8.2  Total Bilirubin 0.20 - 1.20 mg/dL 0.59 0.45 0.51  Alkaline Phos 40 - 150 U/L 298(H) 329(H) 307(H)  AST 5 - 34 U/L 50(H) 49(H) 61(H)  ALT 0 - 55 U/L 33 30 35     INITIAL tumor markers AFP 3.2 CEA > 10,000 CA 19-9 12,929.6  CEA  Status: Finalresult Visible to patient:  Not Released Nextappt: Today at 12:00 PM in Oncology ([place]) Dx:  Metastatic colon cancer to liver              Ref Range 4wk ago  611moago  270mogo  11m80mogo     CEA 0.0 - 5.0 ng/mL 556.6 (H) 694.4 (H)CM 1327.9 (H)CM 1809.2 (H)CM   Comments: Result repeated and verified.Result confirmed by automatic dilution.        CA 19.9  Status: Finalresult Visible to patient:  Not Released Nextappt: Today at 12:00 PM in Oncology ([place]:  Metastatic colon cancer to liver              Ref Range 4wk ago  13moago  241mogo     CA 19-9 <35.0 U/mL 308.0 (H) 462.4 (H) 1296.5 (H)CM          Pathology report  Liver, needle/core biopsy - METASTATIC ADENOCARCINOMA, SEE COMMENT. Microscopic Comment The adenocarcinoma demonstrates the following immunophenotype: Cytokeratin 7 - negative expression. Cytokeratin 20 - strong diffuse expression. CD2 - strong diffuse expression. Overall the morphology and immunophenotype are that of metastatic adenocarcinoma primary to colorectum. The recent nuclear medicine scan demonstrating sigmoid mass with associated liver masses is noted.      RADIOGRAPHIC STUDIES: I have personally reviewed the outside CT scan image with patient and his son.   CT chest, abdomen and pelvis with IV contrast on 03/13/2015. IMPRESSION: 1. Interval response to therapy as evidenced by slight decrease in prominence of masslike thickening involving the sigmoid colon, as well as slight decrease in size of hepatic metastatic masses and left adrenal metastasis. New or increased calcification involving the body of the right adrenal gland may represent a treated occult metastasis. 2. Slight decrease in size of a lymph node in the sigmoid mesentery. Left periaortic lymph node is stable. 3. Coronary artery calcification. 4. Small bilateral inguinal hernias contain fat.   ASSESSMENT & PLAN:  [age][place] old [place], with past history of hypertension and dilated nonischemic gammopathy with EF 25%, no clinical signs of heart failure, who was found to have hepatitis B infection lately, and multiple  liver lesions on the CT scan. He has extremely high CEA and CA 19-9 levels. PET scan reviewed a hypermetabolic sigmoid colon mass, diffuse liver metastasis, probable lung and adrenal gland metastasis.  1. Metastatic sigmoid colon cancer, with diffuse liver, lungs, node and left adrenal gland metastases. KRAS/NRAS wild type, MSI-stable -Liver biopsy showed metastatic adenocarcinoma. His tumor were strongly positive for CK20 and CD2, consistent with primary colorectal primary. KRAS and NRAS mutations were not detected.  -Pt understands that this is incurable cancer, and he has very high disease burden and overall prognosis is poor. The treatment goal is palliative -His bilirubinemia has improved after first cycle chemotherapy.  -Giving the wild-type KRAS/NRAS genotype, he would benefit from EGFR targeted therapy. He started panitumumab from second cycle chemo. He had G2-3 skin rash with neutropenic fever, and was admitted for GI bleeding after second dose panitumumab. Will hold on it for now. May consider cetuximab with second line chemo. -Avastin contraindicated due to his gastric ulcer and bleeding in April 2016.  -I discussed his restaging CT scan, which showed partial response. No new lesions. He is doing well and his tumor marker has come down dramatically, supporting good clinical response.  -He is clinically doing well, hemoglobin stable, no more bleeding.  -Lab reviewed, mild thrombocytopenia, adequate for treatment we'll proceed with chemotherapy, no 5-FU bolus today.   2. Gastric ulcer with significant GI bleeding in April 2016 -He is on PPI. -He will follow-up with Dr. StFuller Plan3. Newly diagnosed diabetes -HbA1c was 8.5, blood glucose 248 today -We again discussed diet and then close monitoring sugar at home. -Continue medication, follow up with primary care physician.  4. HTN, Dilated nonischemic ischemia cardiomyopathy with EF 25% -He is clinically doing well without symptoms of CHF.  However this is probably going to impact his chemotherapy. I'll try to avoid cardiotoxic chemotherapy agent and avoid fluid overload during chemotherapy. -Continue follow-up with cardiology.  5 Hepatitis B carrier -Per liver clinic, no need for treatment. Follow-up with liver clinic.  7. Malnutrition -I encouraged him to eat more,  and take supplements as needed. -follow up with Dietitian   Plan -cycle 8 FOLFOX today,  no 5-FU bolus -Return to clinic in 2 weeks with APP, 4 weeks with me, restaging CT in 4 weeks   All questions were answered. The patient knows to call the clinic with any problems, questions or concerns.  I spent 25 minutes counseling the patient face to face. The total time spent in the appointment was 30 minutes and more than 50% was on counseling.     Truitt Merle, MD 04/26/2015   11:54 AM

## 2015-04-26 NOTE — Telephone Encounter (Signed)
Gave and printed appt sched and avs for pt for July  °

## 2015-04-26 NOTE — Patient Instructions (Signed)
Oxaliplatin Injection What is this medicine? OXALIPLATIN (ox AL i PLA tin) is a chemotherapy drug. It targets fast dividing cells, like cancer cells, and causes these cells to die. This medicine is used to treat cancers of the colon and rectum, and many other cancers. This medicine may be used for other purposes; ask your health care provider or pharmacist if you have questions. COMMON BRAND NAME(S): Eloxatin What should I tell my health care provider before I take this medicine? They need to know if you have any of these conditions: -kidney disease -an unusual or allergic reaction to oxaliplatin, other chemotherapy, other medicines, foods, dyes, or preservatives -pregnant or trying to get pregnant -breast-feeding How should I use this medicine? This drug is given as an infusion into a vein. It is administered in a hospital or clinic by a specially trained health care professional. Talk to your pediatrician regarding the use of this medicine in children. Special care may be needed. Overdosage: If you think you have taken too much of this medicine contact a poison control center or emergency room at once. NOTE: This medicine is only for you. Do not share this medicine with others. What if I miss a dose? It is important not to miss a dose. Call your doctor or health care professional if you are unable to keep an appointment. What may interact with this medicine? -medicines to increase blood counts like filgrastim, pegfilgrastim, sargramostim -probenecid -some antibiotics like amikacin, gentamicin, neomycin, polymyxin B, streptomycin, tobramycin -zalcitabine Talk to your doctor or health care professional before taking any of these medicines: -acetaminophen -aspirin -ibuprofen -ketoprofen -naproxen This list may not describe all possible interactions. Give your health care provider a list of all the medicines, herbs, non-prescription drugs, or dietary supplements you use. Also tell them if  you smoke, drink alcohol, or use illegal drugs. Some items may interact with your medicine. What should I watch for while using this medicine? Your condition will be monitored carefully while you are receiving this medicine. You will need important blood work done while you are taking this medicine. This medicine can make you more sensitive to cold. Do not drink cold drinks or use ice. Cover exposed skin before coming in contact with cold temperatures or cold objects. When out in cold weather wear warm clothing and cover your mouth and nose to warm the air that goes into your lungs. Tell your doctor if you get sensitive to the cold. This drug may make you feel generally unwell. This is not uncommon, as chemotherapy can affect healthy cells as well as cancer cells. Report any side effects. Continue your course of treatment even though you feel ill unless your doctor tells you to stop. In some cases, you may be given additional medicines to help with side effects. Follow all directions for their use. Call your doctor or health care professional for advice if you get a fever, chills or sore throat, or other symptoms of a cold or flu. Do not treat yourself. This drug decreases your body's ability to fight infections. Try to avoid being around people who are sick. This medicine may increase your risk to bruise or bleed. Call your doctor or health care professional if you notice any unusual bleeding. Be careful brushing and flossing your teeth or using a toothpick because you may get an infection or bleed more easily. If you have any dental work done, tell your dentist you are receiving this medicine. Avoid taking products that contain aspirin, acetaminophen, ibuprofen, naproxen,   or ketoprofen unless instructed by your doctor. These medicines may hide a fever. Do not become pregnant while taking this medicine. Women should inform their doctor if they wish to become pregnant or think they might be pregnant. There  is a potential for serious side effects to an unborn child. Talk to your health care professional or pharmacist for more information. Do not breast-feed an infant while taking this medicine. Call your doctor or health care professional if you get diarrhea. Do not treat yourself. What side effects may I notice from receiving this medicine? Side effects that you should report to your doctor or health care professional as soon as possible: -allergic reactions like skin rash, itching or hives, swelling of the face, lips, or tongue -low blood counts - This drug may decrease the number of white blood cells, red blood cells and platelets. You may be at increased risk for infections and bleeding. -signs of infection - fever or chills, cough, sore throat, pain or difficulty passing urine -signs of decreased platelets or bleeding - bruising, pinpoint red spots on the skin, black, tarry stools, nosebleeds -signs of decreased red blood cells - unusually weak or tired, fainting spells, lightheadedness -breathing problems -chest pain, pressure -cough -diarrhea -jaw tightness -mouth sores -nausea and vomiting -pain, swelling, redness or irritation at the injection site -pain, tingling, numbness in the hands or feet -problems with balance, talking, walking -redness, blistering, peeling or loosening of the skin, including inside the mouth -trouble passing urine or change in the amount of urine Side effects that usually do not require medical attention (report to your doctor or health care professional if they continue or are bothersome): -changes in vision -constipation -hair loss -loss of appetite -metallic taste in the mouth or changes in taste -stomach pain This list may not describe all possible side effects. Call your doctor for medical advice about side effects. You may report side effects to FDA at 1-800-FDA-1088. Where should I keep my medicine? This drug is given in a hospital or clinic and  will not be stored at home. NOTE: This sheet is a summary. It may not cover all possible information. If you have questions about this medicine, talk to your doctor, pharmacist, or health care provider.  2015, Elsevier/Gold Standard. (2008-05-10 17:22:47) Leucine Aminopeptidase (LAP) This is a blood or urine test to determine if there is leucine aminopeptidase present. This enzyme becomes elevated when there are problems in the liver, the pancreas, or the small bowel. This test is used mainly to determine if there is a liver disorder.  PREPARATION FOR TEST No preparation or fasting is necessary. A blood sample is obtained by inserting a needle into a vein in the arm. NORMAL FINDINGS  Blood  Male: 80-200 units/ml or 19.2-48.0 units/L (SI units)  Male: 75-185 units/ml or 18.0-44.4 units/L (SI units)  Urine: 2-18 units/24 hr Ranges for normal findings may vary among different laboratories and hospitals. You should always check with your doctor after having lab work or other tests done to discuss the meaning of your test results and whether your values are considered within normal limits. MEANING OF TEST  Your caregiver will go over the test results with you and discuss the importance and meaning of your results, as well as treatment options and the need for additional tests if necessary. OBTAINING THE TEST RESULTS It is your responsibility to obtain your test results. Ask the lab or department performing the test when and how you will get your results. Document Released:  11/16/2004 Document Revised: 01/06/2012 Document Reviewed: 09/24/2008 ExitCare Patient Information 2015 [place], [place]. This information is not intended to replace advice given to you by your health care provider. Make sure you discuss any questions you have with your health care provider. Fluorouracil, 5-FU injection What is this medicine? FLUOROURACIL, 5-FU (flure oh YOOR a sil) is a chemotherapy drug. It slows the growth  of cancer cells. This medicine is used to treat many types of cancer like breast cancer, colon or rectal cancer, pancreatic cancer, and stomach cancer. This medicine may be used for other purposes; ask your health care provider or pharmacist if you have questions. COMMON BRAND NAME(S): Adrucil What should I tell my health care provider before I take this medicine? They need to know if you have any of these conditions: -blood disorders -dihydropyrimidine dehydrogenase (DPD) deficiency -infection (especially a virus infection such as chickenpox, cold sores, or herpes) -kidney disease -liver disease -malnourished, poor nutrition -recent or ongoing radiation therapy -an unusual or allergic reaction to fluorouracil, other chemotherapy, other medicines, foods, dyes, or preservatives -pregnant or trying to get pregnant -breast-feeding How should I use this medicine? This drug is given as an infusion or injection into a vein. It is administered in a hospital or clinic by a specially trained health care professional. Talk to your pediatrician regarding the use of this medicine in children. Special care may be needed. Overdosage: If you think you have taken too much of this medicine contact a poison control center or emergency room at once. NOTE: This medicine is only for you. Do not share this medicine with others. What if I miss a dose? It is important not to miss your dose. Call your doctor or health care professional if you are unable to keep an appointment. What may interact with this medicine? -allopurinol -cimetidine -dapsone -digoxin -hydroxyurea -leucovorin -levamisole -medicines for seizures like ethotoin, fosphenytoin, phenytoin -medicines to increase blood counts like filgrastim, pegfilgrastim, sargramostim -medicines that treat or prevent blood clots like warfarin, enoxaparin, and dalteparin -methotrexate -metronidazole -pyrimethamine -some other chemotherapy drugs like  busulfan, cisplatin, estramustine, vinblastine -trimethoprim -trimetrexate -vaccines Talk to your doctor or health care professional before taking any of these medicines: -acetaminophen -aspirin -ibuprofen -ketoprofen -naproxen This list may not describe all possible interactions. Give your health care provider a list of all the medicines, herbs, non-prescription drugs, or dietary supplements you use. Also tell them if you smoke, drink alcohol, or use illegal drugs. Some items may interact with your medicine. What should I watch for while using this medicine? Visit your doctor for checks on your progress. This drug may make you feel generally unwell. This is not uncommon, as chemotherapy can affect healthy cells as well as cancer cells. Report any side effects. Continue your course of treatment even though you feel ill unless your doctor tells you to stop. In some cases, you may be given additional medicines to help with side effects. Follow all directions for their use. Call your doctor or health care professional for advice if you get a fever, chills or sore throat, or other symptoms of a cold or flu. Do not treat yourself. This drug decreases your body's ability to fight infections. Try to avoid being around people who are sick. This medicine may increase your risk to bruise or bleed. Call your doctor or health care professional if you notice any unusual bleeding. Be careful brushing and flossing your teeth or using a toothpick because you may get an infection or bleed more easily. If  you have any dental work done, tell your dentist you are receiving this medicine. Avoid taking products that contain aspirin, acetaminophen, ibuprofen, naproxen, or ketoprofen unless instructed by your doctor. These medicines may hide a fever. Do not become pregnant while taking this medicine. Women should inform their doctor if they wish to become pregnant or think they might be pregnant. There is a potential for  serious side effects to an unborn child. Talk to your health care professional or pharmacist for more information. Do not breast-feed an infant while taking this medicine. Men should inform their doctor if they wish to father a child. This medicine may lower sperm counts. Do not treat diarrhea with over the counter products. Contact your doctor if you have diarrhea that lasts more than 2 days or if it is severe and watery. This medicine can make you more sensitive to the sun. Keep out of the sun. If you cannot avoid being in the sun, wear protective clothing and use sunscreen. Do not use sun lamps or tanning beds/booths. What side effects may I notice from receiving this medicine? Side effects that you should report to your doctor or health care professional as soon as possible: -allergic reactions like skin rash, itching or hives, swelling of the face, lips, or tongue -low blood counts - this medicine may decrease the number of white blood cells, red blood cells and platelets. You may be at increased risk for infections and bleeding. -signs of infection - fever or chills, cough, sore throat, pain or difficulty passing urine -signs of decreased platelets or bleeding - bruising, pinpoint red spots on the skin, black, tarry stools, blood in the urine -signs of decreased red blood cells - unusually weak or tired, fainting spells, lightheadedness -breathing problems -changes in vision -chest pain -mouth sores -nausea and vomiting -pain, swelling, redness at site where injected -pain, tingling, numbness in the hands or feet -redness, swelling, or sores on hands or feet -stomach pain -unusual bleeding Side effects that usually do not require medical attention (report to your doctor or health care professional if they continue or are bothersome): -changes in finger or toe nails -diarrhea -dry or itchy skin -hair loss -headache -loss of appetite -sensitivity of eyes to the light -stomach  upset -unusually teary eyes This list may not describe all possible side effects. Call your doctor for medical advice about side effects. You may report side effects to FDA at 1-800-FDA-1088. Where should I keep my medicine? This drug is given in a hospital or clinic and will not be stored at home. NOTE: This sheet is a summary. It may not cover all possible information. If you have questions about this medicine, talk to your doctor, pharmacist, or health care provider.  2015, Elsevier/Gold Standard. (2008-02-17 13:53:16)

## 2015-04-28 ENCOUNTER — Ambulatory Visit: Payer: Medicare Other

## 2015-04-28 ENCOUNTER — Ambulatory Visit (HOSPITAL_BASED_OUTPATIENT_CLINIC_OR_DEPARTMENT_OTHER): Payer: Medicare Other

## 2015-04-28 VITALS — BP 154/78 | HR 111 | Temp 98.8°F

## 2015-04-28 DIAGNOSIS — Z5189 Encounter for other specified aftercare: Secondary | ICD-10-CM | POA: Diagnosis not present

## 2015-04-28 DIAGNOSIS — C187 Malignant neoplasm of sigmoid colon: Secondary | ICD-10-CM

## 2015-04-28 DIAGNOSIS — C787 Secondary malignant neoplasm of liver and intrahepatic bile duct: Principal | ICD-10-CM

## 2015-04-28 DIAGNOSIS — C189 Malignant neoplasm of colon, unspecified: Secondary | ICD-10-CM

## 2015-04-28 MED ORDER — SODIUM CHLORIDE 0.9 % IJ SOLN
10.0000 mL | INTRAMUSCULAR | Status: DC | PRN
  Administered 2015-04-28: 10 mL
  Filled 2015-04-28: qty 10

## 2015-04-28 MED ORDER — PEGFILGRASTIM INJECTION 6 MG/0.6ML ~~LOC~~
6.0000 mg | PREFILLED_SYRINGE | Freq: Once | SUBCUTANEOUS | Status: AC
  Administered 2015-04-28: 6 mg via SUBCUTANEOUS
  Filled 2015-04-28: qty 0.6

## 2015-04-28 MED ORDER — HEPARIN SOD (PORK) LOCK FLUSH 100 UNIT/ML IV SOLN
500.0000 [IU] | Freq: Once | INTRAVENOUS | Status: AC | PRN
  Administered 2015-04-28: 500 [IU]
  Filled 2015-04-28: qty 5

## 2015-04-28 NOTE — Patient Instructions (Signed)
Fluorouracil, 5-FU injection What is this medicine? FLUOROURACIL, 5-FU (flure oh YOOR a sil) is a chemotherapy drug. It slows the growth of cancer cells. This medicine is used to treat many types of cancer like breast cancer, colon or rectal cancer, pancreatic cancer, and stomach cancer. This medicine may be used for other purposes; ask your health care provider or pharmacist if you have questions. COMMON BRAND NAME(S): Adrucil What should I tell my health care provider before I take this medicine? They need to know if you have any of these conditions: -blood disorders -dihydropyrimidine dehydrogenase (DPD) deficiency -infection (especially a virus infection such as chickenpox, cold sores, or herpes) -kidney disease -liver disease -malnourished, poor nutrition -recent or ongoing radiation therapy -an unusual or allergic reaction to fluorouracil, other chemotherapy, other medicines, foods, dyes, or preservatives -pregnant or trying to get pregnant -breast-feeding How should I use this medicine? This drug is given as an infusion or injection into a vein. It is administered in a hospital or clinic by a specially trained health care professional. Talk to your pediatrician regarding the use of this medicine in children. Special care may be needed. Overdosage: If you think you have taken too much of this medicine contact a poison control center or emergency room at once. NOTE: This medicine is only for you. Do not share this medicine with others. What if I miss a dose? It is important not to miss your dose. Call your doctor or health care professional if you are unable to keep an appointment. What may interact with this medicine? -allopurinol -cimetidine -dapsone -digoxin -hydroxyurea -leucovorin -levamisole -medicines for seizures like ethotoin, fosphenytoin, phenytoin -medicines to increase blood counts like filgrastim, pegfilgrastim, sargramostim -medicines that treat or prevent blood  clots like warfarin, enoxaparin, and dalteparin -methotrexate -metronidazole -pyrimethamine -some other chemotherapy drugs like busulfan, cisplatin, estramustine, vinblastine -trimethoprim -trimetrexate -vaccines Talk to your doctor or health care professional before taking any of these medicines: -acetaminophen -aspirin -ibuprofen -ketoprofen -naproxen This list may not describe all possible interactions. Give your health care provider a list of all the medicines, herbs, non-prescription drugs, or dietary supplements you use. Also tell them if you smoke, drink alcohol, or use illegal drugs. Some items may interact with your medicine. What should I watch for while using this medicine? Visit your doctor for checks on your progress. This drug may make you feel generally unwell. This is not uncommon, as chemotherapy can affect healthy cells as well as cancer cells. Report any side effects. Continue your course of treatment even though you feel ill unless your doctor tells you to stop. In some cases, you may be given additional medicines to help with side effects. Follow all directions for their use. Call your doctor or health care professional for advice if you get a fever, chills or sore throat, or other symptoms of a cold or flu. Do not treat yourself. This drug decreases your body's ability to fight infections. Try to avoid being around people who are sick. This medicine may increase your risk to bruise or bleed. Call your doctor or health care professional if you notice any unusual bleeding. Be careful brushing and flossing your teeth or using a toothpick because you may get an infection or bleed more easily. If you have any dental work done, tell your dentist you are receiving this medicine. Avoid taking products that contain aspirin, acetaminophen, ibuprofen, naproxen, or ketoprofen unless instructed by your doctor. These medicines may hide a fever. Do not become pregnant while taking this    medicine. Women should inform their doctor if they wish to become pregnant or think they might be pregnant. There is a potential for serious side effects to an unborn child. Talk to your health care professional or pharmacist for more information. Do not breast-feed an infant while taking this medicine. Men should inform their doctor if they wish to father a child. This medicine may lower sperm counts. Do not treat diarrhea with over the counter products. Contact your doctor if you have diarrhea that lasts more than 2 days or if it is severe and watery. This medicine can make you more sensitive to the sun. Keep out of the sun. If you cannot avoid being in the sun, wear protective clothing and use sunscreen. Do not use sun lamps or tanning beds/booths. What side effects may I notice from receiving this medicine? Side effects that you should report to your doctor or health care professional as soon as possible: -allergic reactions like skin rash, itching or hives, swelling of the face, lips, or tongue -low blood counts - this medicine may decrease the number of white blood cells, red blood cells and platelets. You may be at increased risk for infections and bleeding. -signs of infection - fever or chills, cough, sore throat, pain or difficulty passing urine -signs of decreased platelets or bleeding - bruising, pinpoint red spots on the skin, black, tarry stools, blood in the urine -signs of decreased red blood cells - unusually weak or tired, fainting spells, lightheadedness -breathing problems -changes in vision -chest pain -mouth sores -nausea and vomiting -pain, swelling, redness at site where injected -pain, tingling, numbness in the hands or feet -redness, swelling, or sores on hands or feet -stomach pain -unusual bleeding Side effects that usually do not require medical attention (report to your doctor or health care professional if they continue or are bothersome): -changes in finger or  toe nails -diarrhea -dry or itchy skin -hair loss -headache -loss of appetite -sensitivity of eyes to the light -stomach upset -unusually teary eyes This list may not describe all possible side effects. Call your doctor for medical advice about side effects. You may report side effects to FDA at 1-800-FDA-1088. Where should I keep my medicine? This drug is given in a hospital or clinic and will not be stored at home. NOTE: This sheet is a summary. It may not cover all possible information. If you have questions about this medicine, talk to your doctor, pharmacist, or health care provider.  2015, Elsevier/Gold Standard. (2008-02-17 13:53:16)   

## 2015-04-28 NOTE — Progress Notes (Signed)
Neulasta injection given by flush nurse 

## 2015-05-09 ENCOUNTER — Other Ambulatory Visit (HOSPITAL_BASED_OUTPATIENT_CLINIC_OR_DEPARTMENT_OTHER): Payer: Medicare Other

## 2015-05-09 ENCOUNTER — Ambulatory Visit (HOSPITAL_BASED_OUTPATIENT_CLINIC_OR_DEPARTMENT_OTHER): Payer: Medicare Other | Admitting: Nurse Practitioner

## 2015-05-09 VITALS — BP 131/65 | HR 94 | Temp 98.3°F | Resp 18 | Ht 64.25 in | Wt 142.8 lb

## 2015-05-09 DIAGNOSIS — C189 Malignant neoplasm of colon, unspecified: Secondary | ICD-10-CM

## 2015-05-09 DIAGNOSIS — C187 Malignant neoplasm of sigmoid colon: Secondary | ICD-10-CM

## 2015-05-09 DIAGNOSIS — I10 Essential (primary) hypertension: Secondary | ICD-10-CM

## 2015-05-09 DIAGNOSIS — C7972 Secondary malignant neoplasm of left adrenal gland: Secondary | ICD-10-CM | POA: Diagnosis not present

## 2015-05-09 DIAGNOSIS — E119 Type 2 diabetes mellitus without complications: Secondary | ICD-10-CM | POA: Diagnosis not present

## 2015-05-09 DIAGNOSIS — C7800 Secondary malignant neoplasm of unspecified lung: Secondary | ICD-10-CM

## 2015-05-09 DIAGNOSIS — C787 Secondary malignant neoplasm of liver and intrahepatic bile duct: Secondary | ICD-10-CM

## 2015-05-09 LAB — COMPREHENSIVE METABOLIC PANEL (CC13)
ALBUMIN: 3.3 g/dL — AB (ref 3.5–5.0)
ALK PHOS: 331 U/L — AB (ref 40–150)
ALT: 30 U/L (ref 0–55)
ANION GAP: 7 meq/L (ref 3–11)
AST: 43 U/L — AB (ref 5–34)
BUN: 9.8 mg/dL (ref 7.0–26.0)
CO2: 24 mEq/L (ref 22–29)
CREATININE: 1.1 mg/dL (ref 0.7–1.3)
Calcium: 9.5 mg/dL (ref 8.4–10.4)
Chloride: 105 mEq/L (ref 98–109)
EGFR: 67 mL/min/{1.73_m2} — ABNORMAL LOW (ref >90)
GLUCOSE: 308 mg/dL — AB (ref 70–140)
POTASSIUM: 4.2 meq/L (ref 3.5–5.1)
SODIUM: 136 meq/L (ref 136–145)
TOTAL PROTEIN: 8.3 g/dL (ref 6.4–8.3)
Total Bilirubin: 0.42 mg/dL (ref 0.20–1.20)

## 2015-05-09 LAB — CBC WITH DIFFERENTIAL/PLATELET
BASO%: 0.4 % (ref 0.0–2.0)
BASOS ABS: 0 10*3/uL (ref 0.0–0.1)
EOS%: 2.4 % (ref 0.0–7.0)
Eosinophils Absolute: 0.2 10*3/uL (ref 0.0–0.5)
HCT: 31.3 % — ABNORMAL LOW (ref 38.4–49.9)
HGB: 10.1 g/dL — ABNORMAL LOW (ref 13.0–17.1)
LYMPH%: 10.8 % — AB (ref 14.0–49.0)
MCH: 26.1 pg — ABNORMAL LOW (ref 27.2–33.4)
MCHC: 32.3 g/dL (ref 32.0–36.0)
MCV: 80.9 fL (ref 79.3–98.0)
MONO#: 1.2 10*3/uL — ABNORMAL HIGH (ref 0.1–0.9)
MONO%: 11.9 % (ref 0.0–14.0)
NEUT#: 7.5 10*3/uL — ABNORMAL HIGH (ref 1.5–6.5)
NEUT%: 74.5 % (ref 39.0–75.0)
Platelets: 141 10*3/uL (ref 140–400)
RBC: 3.87 10*6/uL — AB (ref 4.20–5.82)
RDW: 22.5 % — ABNORMAL HIGH (ref 11.0–14.6)
WBC: 10 10*3/uL (ref 4.0–10.3)
lymph#: 1.1 10*3/uL (ref 0.9–3.3)

## 2015-05-09 MED ORDER — PROCHLORPERAZINE MALEATE 10 MG PO TABS: 10.0000 mg | ORAL_TABLET | Freq: Four times a day (QID) | ORAL | Status: DC | PRN

## 2015-05-09 NOTE — Progress Notes (Signed)
[facility] OFFICE PROGRESS NOTE   Diagnosis: Metastatic colon cancer  Oncology History   He is a Metastatic colon cancer to liver  Staging form: Colon and Rectum, AJCC 7th Edition  Clinical: Stage Unknown ([place], NX, M1) - Unsigned      Metastatic colon cancer to liver   10/28/2014 Tumor Marker AFP 3.2 CEA > 10,000 CA 19-9 12,929.6. tumor (-) KRAS and NRAS mutation.    11/10/2014 Imaging PET scan showed hypermetabolic mass in the sigmoid colon was noted metastasis in the retroperitoneum. Probable left adrenal and pulmonary metastasis, and diffuse liver metastasis.   11/21/2014 Initial Diagnosis Metastatic colon cancer to liver, lung, abd nodes and left adrenal gland. Diagnosis was made by liver biopsy.    11/30/2014 -  Chemotherapy First line chemo [id number], Panitumumab added second cycle    12/28/2014 - 12/31/2014 Hospital Admission Was admitted for dehydration, neutropenia fever with UTI, and severe skin rash.   02/08/2015 - 02/12/2015 Hospital Admission He was admitted for upper GI bleeding, e.g. showed a gastric ulcer with clots, status post epinephrine injection. He also received a blood transfusion.                INTERVAL HISTORY:   Jason Reilly returns as scheduled. He completed cycle 8 FOLFOX on 04/26/2015. He denies nausea/vomiting. No mouth sores. No diarrhea. No numbness or tingling in his hands or feet. He notes fatigue around the day of pump discontinuation lasting 2-3 days. He has a good appetite. No abdominal pain.  Objective:  Vital signs in last 24 hours:  Blood pressure 131/65, pulse 94, temperature 98.3 F (36.8 C), temperature source Oral, resp. rate 18, height 5' 4.25" (1.632 m), weight 142 lb 12.8 oz (64.774 kg), SpO2 99 %.    HEENT: No thrush or ulcers. Resp: Breath sounds are coarse sounding at the bases. Lungs otherwise clear. Cardio: Regular rate and rhythm. GI: Abdomen soft and  nontender. No hepatomegaly. Vascular: No leg edema. Neuro: Vibratory sense intact over the fingertips per tuning fork exam.  Skin: Several acne appearing lesions scattered over the back.  Port-A-Cath without erythema.  Lab Results:  Lab Results  Component Value Date   WBC 10.0 05/09/2015   HGB 10.1* 05/09/2015   HCT 31.3* 05/09/2015   MCV 80.9 05/09/2015   PLT 141 05/09/2015   NEUTROABS 7.5* 05/09/2015    Imaging:  No results found.  Medications: I have reviewed the patient's current medications.  Assessment/Plan: 1. Metastatic sigmoid colon cancer, with diffuse liver, lungs, node and left adrenal gland metastases. KRAS/NRAS wild type, MSI stable.   FOLFOX initiated 11/30/2014;   Cycle 2 FOLFOX with PANITUMUMAB added 12/14/2014.   Hospitalized with dehydration, febrile neutropenia and a severe skin rash 12/28/2014 through 12/31/2014.   Cycle 3 FOLFOX 01/11/2015, PANITUMUMAB held; Neulasta support on day 3.   Cycle 4 FOLFOX with dose reduced PANITUMUMAB 02/02/2015;   Hospitalization with upper GI bleed with findings of a gastric ulcer 02/08/2015 through 02/12/2015.   CEA and CA-19-9 improved 02/28/2015.   Restaging CT evaluation 03/13/2015 with slight decrease in prominence of masslike thickening involving the sigmoid colon, slight decrease in size of hepatic metastatic masses and left adrenal metastasis. Slight decrease in size of a lymph node in the sigmoid mesentery. Stable left periaortic lymph node. New or increased calcification involving the body of the right adrenal gland.   Cycle 5 FOLFOX 03/15/2015 with no 5-FU bolus.  Cycle 6 FOLFOX 03/30/2015  Cycle 7 FOLFOX 04/12/2015  Cycle 8 FOLFOX  04/26/2015 2. Gastric ulcer with significant GI bleed April 2016 3. Diabetes 4. Hypertension, dilated nonischemic cardiomyopathy 5. Hepatitis B carrier   Disposition: Jason Reilly appears stable. He has completed 8 cycles of FOLFOX. Plan to proceed with cycle 9 on  05/10/2015 as scheduled. Restaging CT evaluation planned 05/19/2015. He will return for a follow-up visit on 05/24/2015. He understands to contact the office in the interim with any problems.    Ned Card ANP/GNP-BC   05/09/2015  2:28 PM

## 2015-05-10 ENCOUNTER — Other Ambulatory Visit: Payer: Self-pay | Admitting: Hematology

## 2015-05-10 ENCOUNTER — Ambulatory Visit (HOSPITAL_BASED_OUTPATIENT_CLINIC_OR_DEPARTMENT_OTHER): Payer: Medicare Other

## 2015-05-10 VITALS — BP 132/68 | HR 96 | Temp 98.0°F | Resp 18

## 2015-05-10 DIAGNOSIS — C187 Malignant neoplasm of sigmoid colon: Secondary | ICD-10-CM | POA: Diagnosis not present

## 2015-05-10 DIAGNOSIS — C787 Secondary malignant neoplasm of liver and intrahepatic bile duct: Secondary | ICD-10-CM

## 2015-05-10 DIAGNOSIS — Z5111 Encounter for antineoplastic chemotherapy: Secondary | ICD-10-CM

## 2015-05-10 DIAGNOSIS — C189 Malignant neoplasm of colon, unspecified: Secondary | ICD-10-CM

## 2015-05-10 LAB — CANCER ANTIGEN 19-9: CA 19-9: 164.7 U/mL — ABNORMAL HIGH (ref <35.0)

## 2015-05-10 LAB — CEA: CEA: 249.5 ng/mL — ABNORMAL HIGH (ref 0.0–5.0)

## 2015-05-10 MED ORDER — DEXTROSE 5 % IV SOLN
Freq: Once | INTRAVENOUS | Status: AC
  Administered 2015-05-10: 13:00:00 via INTRAVENOUS

## 2015-05-10 MED ORDER — LEUCOVORIN CALCIUM INJECTION 350 MG
398.0000 mg/m2 | Freq: Once | INTRAVENOUS | Status: AC
  Administered 2015-05-10: 620 mg via INTRAVENOUS
  Filled 2015-05-10: qty 31

## 2015-05-10 MED ORDER — DEXAMETHASONE SODIUM PHOSPHATE 100 MG/10ML IJ SOLN
Freq: Once | INTRAMUSCULAR | Status: AC
  Administered 2015-05-10: 14:00:00 via INTRAVENOUS
  Filled 2015-05-10: qty 4

## 2015-05-10 MED ORDER — FLUOROURACIL CHEMO INJECTION 5 GM/100ML
2400.0000 mg/m2 | INTRAVENOUS | Status: DC
  Administered 2015-05-10: 3750 mg via INTRAVENOUS
  Filled 2015-05-10: qty 75

## 2015-05-10 MED ORDER — OXALIPLATIN CHEMO INJECTION 100 MG/20ML
85.0000 mg/m2 | Freq: Once | INTRAVENOUS | Status: AC
  Administered 2015-05-10: 135 mg via INTRAVENOUS
  Filled 2015-05-10: qty 27

## 2015-05-10 NOTE — Patient Instructions (Signed)

## 2015-05-12 ENCOUNTER — Ambulatory Visit: Payer: Medicare Other

## 2015-05-12 ENCOUNTER — Ambulatory Visit (HOSPITAL_BASED_OUTPATIENT_CLINIC_OR_DEPARTMENT_OTHER): Payer: Medicare Other

## 2015-05-12 VITALS — BP 102/85 | HR 101 | Temp 97.9°F | Resp 18

## 2015-05-12 DIAGNOSIS — C189 Malignant neoplasm of colon, unspecified: Secondary | ICD-10-CM

## 2015-05-12 DIAGNOSIS — C787 Secondary malignant neoplasm of liver and intrahepatic bile duct: Principal | ICD-10-CM

## 2015-05-12 DIAGNOSIS — C187 Malignant neoplasm of sigmoid colon: Secondary | ICD-10-CM

## 2015-05-12 DIAGNOSIS — Z452 Encounter for adjustment and management of vascular access device: Secondary | ICD-10-CM

## 2015-05-12 DIAGNOSIS — Z5189 Encounter for other specified aftercare: Secondary | ICD-10-CM | POA: Diagnosis not present

## 2015-05-12 MED ORDER — SODIUM CHLORIDE 0.9 % IJ SOLN
10.0000 mL | INTRAMUSCULAR | Status: DC | PRN
  Administered 2015-05-12: 10 mL
  Filled 2015-05-12: qty 10

## 2015-05-12 MED ORDER — HEPARIN SOD (PORK) LOCK FLUSH 100 UNIT/ML IV SOLN
500.0000 [IU] | Freq: Once | INTRAVENOUS | Status: AC | PRN
  Administered 2015-05-12: 500 [IU]
  Filled 2015-05-12: qty 5

## 2015-05-12 MED ORDER — PEGFILGRASTIM INJECTION 6 MG/0.6ML ~~LOC~~
6.0000 mg | PREFILLED_SYRINGE | Freq: Once | SUBCUTANEOUS | Status: AC
  Administered 2015-05-12: 6 mg via SUBCUTANEOUS
  Filled 2015-05-12: qty 0.6

## 2015-05-12 NOTE — Progress Notes (Signed)
Neukasta injection given by flush nurse

## 2015-05-12 NOTE — Patient Instructions (Signed)
Pegfilgrastim injection What is this medicine? PEGFILGRASTIM (peg fil GRA stim) is a long-acting granulocyte colony-stimulating factor that stimulates the growth of neutrophils, a type of white blood cell important in the body's fight against infection. It is used to reduce the incidence of fever and infection in patients with certain types of cancer who are receiving chemotherapy that affects the bone marrow. This medicine may be used for other purposes; ask your health care provider or pharmacist if you have questions. COMMON BRAND NAME(S): Neulasta What should I tell my health care provider before I take this medicine? They need to know if you have any of these conditions: -latex allergy -ongoing radiation therapy -sickle cell disease -skin reactions to acrylic adhesives (On-Body Injector only) -an unusual or allergic reaction to pegfilgrastim, filgrastim, other medicines, foods, dyes, or preservatives -pregnant or trying to get pregnant -breast-feeding How should I use this medicine? This medicine is for injection under the skin. If you get this medicine at home, you will be taught how to prepare and give the pre-filled syringe or how to use the On-body Injector. Refer to the patient Instructions for Use for detailed instructions. Use exactly as directed. Take your medicine at regular intervals. Do not take your medicine more often than directed. It is important that you put your used needles and syringes in a special sharps container. Do not put them in a trash can. If you do not have a sharps container, call your pharmacist or healthcare provider to get one. Talk to your pediatrician regarding the use of this medicine in children. Special care may be needed. Overdosage: If you think you have taken too much of this medicine contact a poison control center or emergency room at once. NOTE: This medicine is only for you. Do not share this medicine with others. What if I miss a dose? It is  important not to miss your dose. Call your doctor or health care professional if you miss your dose. If you miss a dose due to an On-body Injector failure or leakage, a new dose should be administered as soon as possible using a single prefilled syringe for manual use. What may interact with this medicine? Interactions have not been studied. Give your health care provider a list of all the medicines, herbs, non-prescription drugs, or dietary supplements you use. Also tell them if you smoke, drink alcohol, or use illegal drugs. Some items may interact with your medicine. This list may not describe all possible interactions. Give your health care provider a list of all the medicines, herbs, non-prescription drugs, or dietary supplements you use. Also tell them if you smoke, drink alcohol, or use illegal drugs. Some items may interact with your medicine. What should I watch for while using this medicine? You may need blood work done while you are taking this medicine. If you are going to need a MRI, CT scan, or other procedure, tell your doctor that you are using this medicine (On-Body Injector only). What side effects may I notice from receiving this medicine? Side effects that you should report to your doctor or health care professional as soon as possible: -allergic reactions like skin rash, itching or hives, swelling of the face, lips, or tongue -dizziness -fever -pain, redness, or irritation at site where injected -pinpoint red spots on the skin -shortness of breath or breathing problems -stomach or side pain, or pain at the shoulder -swelling -tiredness -trouble passing urine Side effects that usually do not require medical attention (report to your doctor   or health care professional if they continue or are bothersome): -bone pain -muscle pain This list may not describe all possible side effects. Call your doctor for medical advice about side effects. You may report side effects to FDA at  1-800-FDA-1088. Where should I keep my medicine? Keep out of the reach of children. Store pre-filled syringes in a refrigerator between 2 and 8 degrees C (36 and 46 degrees F). Do not freeze. Keep in carton to protect from light. Throw away this medicine if it is left out of the refrigerator for more than 48 hours. Throw away any unused medicine after the expiration date. NOTE: This sheet is a summary. It may not cover all possible information. If you have questions about this medicine, talk to your doctor, pharmacist, or health care provider.  2015, Elsevier/Gold Standard. (2014-01-13 16:14:05)  

## 2015-05-12 NOTE — Progress Notes (Signed)
1145 05/12/15: patient has been waiting in [facility] lobby for pump d/c. appt scheduled later in the day. approx 7 cc remaining in bag. Per [name], double rate to 6.6 for remainder, no bolus at this time. Rate changed per MD, verified with Cameo, RN. Patient to return in 1 hour for d/c. He verbalizes understanding.

## 2015-05-19 ENCOUNTER — Encounter (HOSPITAL_COMMUNITY): Payer: Self-pay

## 2015-05-19 ENCOUNTER — Ambulatory Visit (HOSPITAL_COMMUNITY)
Admission: RE | Admit: 2015-05-19 | Discharge: 2015-05-19 | Disposition: A | Discharge status: Home / Self Care | Payer: Medicare Other | Source: Ambulatory Visit | Attending: Hematology | Admitting: Hematology

## 2015-05-19 DIAGNOSIS — C189 Malignant neoplasm of colon, unspecified: Secondary | ICD-10-CM | POA: Diagnosis not present

## 2015-05-19 DIAGNOSIS — C787 Secondary malignant neoplasm of liver and intrahepatic bile duct: Secondary | ICD-10-CM | POA: Diagnosis present

## 2015-05-19 DIAGNOSIS — C7970 Secondary malignant neoplasm of unspecified adrenal gland: Secondary | ICD-10-CM | POA: Insufficient documentation

## 2015-05-19 HISTORY — DX: Type 2 diabetes mellitus without complications: E11.9

## 2015-05-19 MED ORDER — IOHEXOL 300 MG/ML  SOLN
100.0000 mL | Freq: Once | INTRAMUSCULAR | Status: AC | PRN
  Administered 2015-05-19: 100 mL via INTRAVENOUS

## 2015-05-24 ENCOUNTER — Telehealth: Payer: Self-pay | Admitting: Hematology

## 2015-05-24 ENCOUNTER — Ambulatory Visit (HOSPITAL_COMMUNITY)
Admission: RE | Admit: 2015-05-24 | Discharge: 2015-05-24 | Disposition: A | Discharge status: Home / Self Care | Payer: Medicare Other | Source: Ambulatory Visit | Attending: Hematology | Admitting: Hematology

## 2015-05-24 ENCOUNTER — Encounter: Payer: Self-pay | Admitting: Hematology

## 2015-05-24 ENCOUNTER — Other Ambulatory Visit (HOSPITAL_BASED_OUTPATIENT_CLINIC_OR_DEPARTMENT_OTHER): Payer: Medicare Other

## 2015-05-24 ENCOUNTER — Ambulatory Visit (HOSPITAL_BASED_OUTPATIENT_CLINIC_OR_DEPARTMENT_OTHER): Payer: Medicare Other | Admitting: Hematology

## 2015-05-24 ENCOUNTER — Ambulatory Visit (HOSPITAL_BASED_OUTPATIENT_CLINIC_OR_DEPARTMENT_OTHER): Payer: Medicare Other

## 2015-05-24 ENCOUNTER — Telehealth: Payer: Self-pay | Admitting: *Deleted

## 2015-05-24 VITALS — BP 148/74 | HR 79 | Temp 98.6°F | Resp 18 | Ht 64.25 in | Wt 142.9 lb

## 2015-05-24 VITALS — BP 152/67 | HR 75 | Temp 97.0°F | Resp 16

## 2015-05-24 DIAGNOSIS — E119 Type 2 diabetes mellitus without complications: Secondary | ICD-10-CM

## 2015-05-24 DIAGNOSIS — C189 Malignant neoplasm of colon, unspecified: Secondary | ICD-10-CM | POA: Diagnosis present

## 2015-05-24 DIAGNOSIS — C786 Secondary malignant neoplasm of retroperitoneum and peritoneum: Secondary | ICD-10-CM

## 2015-05-24 DIAGNOSIS — I429 Cardiomyopathy, unspecified: Secondary | ICD-10-CM | POA: Diagnosis not present

## 2015-05-24 DIAGNOSIS — C7972 Secondary malignant neoplasm of left adrenal gland: Secondary | ICD-10-CM

## 2015-05-24 DIAGNOSIS — C187 Malignant neoplasm of sigmoid colon: Secondary | ICD-10-CM

## 2015-05-24 DIAGNOSIS — C787 Secondary malignant neoplasm of liver and intrahepatic bile duct: Secondary | ICD-10-CM | POA: Insufficient documentation

## 2015-05-24 DIAGNOSIS — I10 Essential (primary) hypertension: Secondary | ICD-10-CM

## 2015-05-24 DIAGNOSIS — C7800 Secondary malignant neoplasm of unspecified lung: Secondary | ICD-10-CM | POA: Diagnosis not present

## 2015-05-24 DIAGNOSIS — Z2251 Carrier of viral hepatitis B: Secondary | ICD-10-CM | POA: Diagnosis not present

## 2015-05-24 DIAGNOSIS — Z5111 Encounter for antineoplastic chemotherapy: Secondary | ICD-10-CM | POA: Diagnosis present

## 2015-05-24 LAB — COMPREHENSIVE METABOLIC PANEL (CC13)
ALBUMIN: 3 g/dL — AB (ref 3.5–5.0)
ALT: 33 U/L (ref 0–55)
AST: 49 U/L — AB (ref 5–34)
Alkaline Phosphatase: 273 U/L — ABNORMAL HIGH (ref 40–150)
Anion Gap: 4 mEq/L (ref 3–11)
BUN: 10.8 mg/dL (ref 7.0–26.0)
CALCIUM: 8.3 mg/dL — AB (ref 8.4–10.4)
CHLORIDE: 106 meq/L (ref 98–109)
CO2: 25 meq/L (ref 22–29)
Creatinine: 0.8 mg/dL (ref 0.7–1.3)
EGFR: >90 mL/min/{1.73_m2} (ref >90)
Glucose: 207 mg/dl — ABNORMAL HIGH (ref 70–140)
Potassium: 4 mEq/L (ref 3.5–5.1)
Sodium: 134 mEq/L — ABNORMAL LOW (ref 136–145)
Total Bilirubin: 0.45 mg/dL (ref 0.20–1.20)
Total Protein: 7.3 g/dL (ref 6.4–8.3)

## 2015-05-24 LAB — CBC WITH DIFFERENTIAL/PLATELET
BASO%: 0.2 % (ref 0.0–2.0)
BASOS ABS: 0 10*3/uL (ref 0.0–0.1)
EOS ABS: 0.2 10*3/uL (ref 0.0–0.5)
EOS%: 1.6 % (ref 0.0–7.0)
HCT: 20.7 % — ABNORMAL LOW (ref 38.4–49.9)
HEMOGLOBIN: 6.7 g/dL — AB (ref 13.0–17.1)
LYMPH#: 1.3 10*3/uL (ref 0.9–3.3)
LYMPH%: 10 % — ABNORMAL LOW (ref 14.0–49.0)
MCH: 26.6 pg — ABNORMAL LOW (ref 27.2–33.4)
MCHC: 32.4 g/dL (ref 32.0–36.0)
MCV: 82.1 fL (ref 79.3–98.0)
MONO#: 0.9 10*3/uL (ref 0.1–0.9)
MONO%: 6.9 % (ref 0.0–14.0)
NEUT#: 10.2 10*3/uL — ABNORMAL HIGH (ref 1.5–6.5)
NEUT%: 81.3 % — ABNORMAL HIGH (ref 39.0–75.0)
PLATELETS: 112 10*3/uL — AB (ref 140–400)
RBC: 2.52 10*6/uL — AB (ref 4.20–5.82)
RDW: 23 % — AB (ref 11.0–14.6)
WBC: 12.5 10*3/uL — AB (ref 4.0–10.3)
nRBC: 1 % — ABNORMAL HIGH (ref 0–0)

## 2015-05-24 MED ORDER — ACETAMINOPHEN 325 MG PO TABS
ORAL_TABLET | ORAL | Status: AC
  Filled 2015-05-24: qty 2

## 2015-05-24 MED ORDER — DEXTROSE 5 % IV SOLN
Freq: Once | INTRAVENOUS | Status: AC
  Administered 2015-05-24: 14:00:00 via INTRAVENOUS

## 2015-05-24 MED ORDER — DEXTROSE 5 % IV SOLN
85.0000 mg/m2 | Freq: Once | INTRAVENOUS | Status: AC
  Administered 2015-05-24: 135 mg via INTRAVENOUS
  Filled 2015-05-24: qty 27

## 2015-05-24 MED ORDER — DIPHENHYDRAMINE HCL 25 MG PO CAPS
ORAL_CAPSULE | ORAL | Status: AC
  Filled 2015-05-24: qty 1

## 2015-05-24 MED ORDER — SODIUM CHLORIDE 0.9 % IJ SOLN
10.0000 mL | INTRAMUSCULAR | Status: DC | PRN
  Filled 2015-05-24: qty 10

## 2015-05-24 MED ORDER — ACETAMINOPHEN 325 MG PO TABS
650.0000 mg | ORAL_TABLET | Freq: Once | ORAL | Status: AC
  Administered 2015-05-24: 650 mg via ORAL

## 2015-05-24 MED ORDER — FUROSEMIDE 10 MG/ML IJ SOLN
20.0000 mg | Freq: Once | INTRAMUSCULAR | Status: AC
  Administered 2015-05-24: 20 mg via INTRAVENOUS

## 2015-05-24 MED ORDER — LEUCOVORIN CALCIUM INJECTION 350 MG
398.0000 mg/m2 | Freq: Once | INTRAMUSCULAR | Status: AC
  Administered 2015-05-24: 620 mg via INTRAVENOUS
  Filled 2015-05-24: qty 31

## 2015-05-24 MED ORDER — HEPARIN SOD (PORK) LOCK FLUSH 100 UNIT/ML IV SOLN
500.0000 [IU] | Freq: Once | INTRAVENOUS | Status: DC | PRN
  Filled 2015-05-24: qty 5

## 2015-05-24 MED ORDER — DIPHENHYDRAMINE HCL 25 MG PO CAPS
25.0000 mg | ORAL_CAPSULE | Freq: Once | ORAL | Status: AC
  Administered 2015-05-24: 25 mg via ORAL

## 2015-05-24 MED ORDER — SODIUM CHLORIDE 0.9 % IV SOLN
Freq: Once | INTRAVENOUS | Status: AC
  Administered 2015-05-24: 14:00:00 via INTRAVENOUS
  Filled 2015-05-24: qty 4

## 2015-05-24 MED ORDER — SODIUM CHLORIDE 0.9 % IV SOLN
2400.0000 mg/m2 | INTRAVENOUS | Status: DC
  Administered 2015-05-24: 3750 mg via INTRAVENOUS
  Filled 2015-05-24: qty 75

## 2015-05-24 NOTE — Progress Notes (Signed)
OK to start peripheral IV per Dr Burr Medico for concurrent blood transfusion.

## 2015-05-24 NOTE — Telephone Encounter (Signed)
per pof to sch pt appt-gave pt copy of avs-sent MW email to sch pt trmt-pt son aware of appts-adv will call once reply

## 2015-05-24 NOTE — Progress Notes (Signed)
Per [name],  [place] to treat today and give 1 unit of PRBCs today.  Pt to receive another unit of blood on Fri 7/29 when pt returns for pump disconnect.

## 2015-05-24 NOTE — Telephone Encounter (Signed)
per pof to sch pt appt-cld & spoke to pt son Blair Hailey and gave pt time & date of blood trans on 7/29 and trmt on 8/10-pt son understood

## 2015-05-24 NOTE — Progress Notes (Signed)
[place]  [phone]) [phone] Fax:([phone]) [place] Note   Patient Care Team: [name], MD as PCP - General (Specialist) [name], CRNP as Nurse Practitioner (Nurse Practitioner) Truitt Merle, MD as Consulting Physician (Hematology) 05/24/2015  CHIEF COMPLAINTS Metastatic colon cancer  Oncology History   He is a Metastatic colon cancer to liver   Staging form: Colon and Rectum, AJCC 7th Edition     Clinical: Stage Unknown ([place], NX, M1) - Unsigned  Our health is     Metastatic colon cancer to liver   10/28/2014 Tumor Marker AFP 3.2 CEA > 10,000 CA 19-9 12,929.6. tumor (-) KRAS and NRAS mutation.    11/10/2014 Imaging PET scan showed hypermetabolic mass in the sigmoid colon was noted metastasis in the retroperitoneum. Probable left adrenal and pulmonary metastasis, and diffuse liver metastasis.   11/21/2014 Initial Diagnosis Metastatic colon cancer to liver, lung, abd nodes and left adrenal gland. Diagnosis was made by liver biopsy.    11/30/2014 -  Chemotherapy First line chemo [id number], Panitumumab added from second cycle    12/28/2014 - 12/31/2014 Hospital Admission Was admitted for dehydration, neutropenia fever with UTI, and severe skin rash.   02/08/2015 - 02/12/2015 Hospital Admission He was admitted for upper GI bleeding, e.g. showed a gastric ulcer with clots, status post appendectomy injection. He also received a blood transfusion.   03/01/2015 Tumor Marker CEA 694, CA19.9 462   03/13/2015 Imaging CT CAP showed partial response, no new lesions.    03/15/2015 -  Chemotherapy restart FOLFOX     CURRENT THERAPY: mFOLFOX6 started on 11/30/2014, Panitumumab added to second cycle on 12/14/14, 50% dose reduction with cycle 4, then stopped due to possible AEs  HISTORY OF PRESENTING ILLNESS:  Jason Reilly 69 y.o. male is here because of abnormal CT findings, which is very suspicious for malignancy. He is on ranitidine from [place], has been on in the [place]  for 16 years. He came in with his son and an interpreter.  He has been feeling fatigued since two month ago. He is still able to do all ADLs. He otherwise denies any pain, bloating or nausea.  He lost about 20lbs in 3 month. His appetite is lower than before, eats less, no change of his bowl habits.  She denied any hematochezia or melana. Per his son, he has had some personality changes daily, irritable, slightly confused some time.  He was evaluated by his primary care physician. Lab test reviewed hepatitis B infection, which he did not know before, and elevated alkaline phosphatase, his liver function and the rest of the liver function was not remarkable. [place] of abdomen was obtained on 07/22/2014, which showed diffusely abnormal liver with multiple echogenic lesions. CT of abdomen with and without contrast was done on 08/26/2014, which reviewed here at a medically with multiple large partially calcified hepatic masses consistent with metastatic disease. Mild retroperitoneal adenopathy with the largest node measuring 1.6 cm. And nonspecific 1.4 cm left adrenal nodule was also noticed. His tumor marker showed CEA greater than 10,000, CA 19-9 12,929, AFP 3.2 (normal). He was referred to [facility] and was evaluated by nurse practitioner [name]. Treatment for hepatitis B was not recommended based on his virus load.  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 He was evaluated by a cardiologist in 2014. He denies any significant dyspnea on exertion. No leg swollen.  INTERIM HISTORY: Jason Reilly returns for follow-up and cycle  9 chemo. He is doing well overall. He has a mild fatigue after chemotherapy, no significant nausea, vomiting, diarrhea or other side effects. He denies any pain, has good appetite and moderate energy  MEDICAL HISTORY:  Past Medical History  Diagnosis Date  . Tachycardia   . Abnormal EKG   . Hypertension   .  Non-ischemic cardiomyopathy   . Hepatitis B   . H. pylori infection   . Gastric ulcer   . Metastatic colon cancer to liver   . Diabetes mellitus without complication     metformin    SURGICAL HISTORY: Past Surgical History  Procedure Laterality Date  . Nuclear stress test  03/03/2013    High risk - consistent with nonischemic cardiomyopathy  . Left and right heart catheterization with coronary angiogram N/A 03/29/2013    Procedure: LEFT AND RIGHT HEART CATHETERIZATION WITH CORONARY ANGIOGRAM;  Surgeon: Pixie Casino, MD;  Location: [facility] CATH LAB;  Service: Cardiovascular;  Laterality: N/A;  . Esophagogastroduodenoscopy N/A 02/08/2015    Procedure: ESOPHAGOGASTRODUODENOSCOPY (EGD);  Surgeon: Ladene Artist, MD;  Location: [facility]COPY;  Service: Endoscopy;  Laterality: N/A;    SOCIAL HISTORY: History   Social History  . Marital Status: Married    Spouse Name: N/A  . Number of Children: N/A  . Years of Education: N/A   Occupational History  . Not on file.   Social History Main Topics  . Smoking status: Former Research scientist (life sciences)  . Smokeless tobacco: Never Used  . Alcohol Use: Yes     Comment: occasional  . Drug Use: No  . Sexual Activity: No   Other Topics Concern  . Not on file   Social History Narrative    FAMILY HISTORY: No family history of liver disease or malignancy.  ALLERGIES:  has No Known Allergies.  MEDICATIONS:  Current Outpatient Prescriptions on File Prior to Visit  Medication Sig Dispense Refill  . clindamycin (CLINDAGEL) 1 % gel Apply topically 2 (two) times daily. 30 g 2  . hydrocortisone 2.5 % cream Apply topically 2 (two) times daily. Once daily on face and twice daily on body 30 g 2  . lidocaine-prilocaine (EMLA) cream Apply 1 application topically as needed. Apply to [facility] cath at least one hour before needle stick. 30 g 2  . linagliptin (TRADJENTA) 5 MG TABS tablet Take 1 tablet (5 mg total) by mouth daily. 30 tablet 1  . lisinopril-hydrochlorothiazide  (PRINZIDE,ZESTORETIC) 10-12.5 MG per tablet Take 1 tablet by mouth daily.  3  . loperamide (IMODIUM A-D) 2 MG tablet Take 2 mg by mouth 4 (four) times daily as needed for diarrhea or loose stools.    . metFORMIN (GLUCOPHAGE) 500 MG tablet Take by mouth 2 (two) times daily with a meal.    . mirtazapine (REMERON) 15 MG tablet Take 1 tablet (15 mg total) by mouth at bedtime. 30 tablet 2  . ondansetron (ZOFRAN) 8 MG tablet Take 1 tablet (8 mg total) by mouth every 8 (eight) hours as needed for nausea or vomiting. 45 tablet 0  . pantoprazole (PROTONIX) 40 MG tablet Take 1 tablet (40 mg total) by mouth daily. 30 tablet 11  . prochlorperazine (COMPAZINE) 10 MG tablet Take 1 tablet (10 mg total) by mouth every 6 (six) hours as needed for nausea or vomiting. 30 tablet 1   No current facility-administered medications on file prior to visit.  ;   REVIEW OF SYSTEMS:   Constitutional: Denies fevers, chills or abnormal night sweats, (+) fatigue  Eyes:  Denies blurriness of vision, double vision or watery eyes Ears, nose, mouth, throat, and face: Denies mucositis or sore throat Respiratory: Denies cough, dyspnea or wheezes Cardiovascular: Denies palpitation, chest discomfort or lower extremity swelling Gastrointestinal: Denies nausea, heartburn or change in bowel habits Skin: Denies abnormal skin rashes Lymphatics: Denies new lymphadenopathy or easy bruising Neurological:Denies numbness, tingling or new weaknesses Behavioral/Psych: Mood is stable, no new changes, (+) insomnia All other systems were reviewed with the patient and are negative.  PHYSICAL EXAMINATION: BP 148/74 mmHg  Pulse 79  Temp(Src) 98.6 F (37 C) (Oral)  Resp 18  Ht 5' 4.25" (1.632 m)  Wt 142 lb 14.4 oz (64.819 kg)  BMI 24.34 kg/m2  SpO2 100%  ECOG PERFORMANCE STATUS: 3 Vital sign were taken at in the infusion room, within normal limits. GENERAL:alert, no distress and comfortable SKIN: (+) Dry skin, scatter acne like skin rash  es on the face, neck and trunk, better than last time, no skin ulcer or discharge. EYES: normal, conjunctiva are pink and non-injected, sclera clear OROPHARYNX:no exudate, no erythema and lips, buccal mucosa, and tongue normal  NECK: supple, thyroid normal size, non-tender, without nodularity LYMPH:  no palpable lymphadenopathy in the cervical, axillary or inguinal LUNGS: clear to auscultation and percussion with normal breathing effort, (+) crackles on b/l lung base  HEART: regular rate & rhythm and no murmurs and no lower extremity edema ABDOMEN:abdomen soft, non-tender, no hepatomegaly, no splenomegaly and normal bowel sounds Musculoskeletal:no cyanosis of digits and no clubbing  PSYCH: alert & oriented x 3 with fluent speech NEURO: no focal motor/sensory deficits  LABORATORY DATA:  I have reviewed the data as listed CBC Latest Ref Rng 05/24/2015 05/09/2015 04/26/2015  WBC 4.0 - 10.3 10e3/uL 12.5(H) 10.0 8.8  Hemoglobin 13.0 - 17.1 g/dL 6.7(LL) 10.1(L) 9.8(L)  Hematocrit 38.4 - 49.9 % 20.7(L) 31.3(L) 30.5(L)  Platelets 140 - 400 10e3/uL 112(L) 141 118(L)    CMP Latest Ref Rng 05/24/2015 05/09/2015 04/26/2015  Glucose 70 - 140 mg/dl 207(H) 308(H) 248(H)  BUN 7.0 - 26.0 mg/dL 10.8 9.8 8.5  Creatinine 0.7 - 1.3 mg/dL 0.8 1.1 1.0  Sodium 136 - 145 mEq/L 134(L) 136 138  Potassium 3.5 - 5.1 mEq/L 4.0 4.2 4.0  Chloride 96 - 112 mmol/L - - -  CO2 22 - 29 mEq/L '25 24 24  ' Calcium 8.4 - 10.4 mg/dL 8.3(L) 9.5 8.9  Total Protein 6.4 - 8.3 g/dL 7.3 8.3 8.1  Total Bilirubin 0.20 - 1.20 mg/dL 0.45 0.42 0.59  Alkaline Phos 40 - 150 U/L 273(H) 331(H) 298(H)  AST 5 - 34 U/L 49(H) 43(H) 50(H)  ALT 0 - 55 U/L 33 30 33     INITIAL tumor markers AFP 3.2 CEA > 10,000 CA 19-9 12,929.6  Results for JUANDANIEL, MANFREDO (MRN [id number]) as of 05/24/2015 13:50  Ref. Range 05/09/2015 13:45  CA 19-9 Latest Ref Range: <35.0 U/mL 164.7 (H)  CEA Latest Ref Range: 0.0-5.0 ng/mL 249.5 (H)    Pathology report  Liver,  needle/core biopsy - METASTATIC ADENOCARCINOMA, SEE COMMENT. Microscopic Comment The adenocarcinoma demonstrates the following immunophenotype: Cytokeratin 7 - negative expression. Cytokeratin 20 - strong diffuse expression. CD2 - strong diffuse expression. Overall the morphology and immunophenotype are that of metastatic adenocarcinoma primary to colorectum. The recent nuclear medicine scan demonstrating sigmoid mass with associated liver masses is noted.      RADIOGRAPHIC STUDIES: I have personally reviewed the outside CT scan image with patient and his son.   CT chest, abdomen and  pelvis with IV contrast on 05/19/2015. IMPRESSION: Mild residual wall thickening at the site of known sigmoid colonic adenocarcinoma.  Improving nodal metastases, as described above, including a dominant 11 mm left para-aortic node.  Improving multifocal hepatic metastases, as described above.  Small bilateral adrenal metastases.  Near complete resolution of prior calcified pulmonary metastases.  ASSESSMENT & PLAN:  69 year old [place] male, with past history of hypertension and dilated nonischemic gammopathy with EF 25%, no clinical signs of heart failure, who was found to have hepatitis B infection lately, and multiple liver lesions on the CT scan. He has extremely high CEA and CA 19-9 levels. PET scan reviewed a hypermetabolic sigmoid colon mass, diffuse liver metastasis, probable lung and adrenal gland metastasis.  1. Metastatic sigmoid colon cancer, with diffuse liver, lungs, node and left adrenal gland metastases. KRAS/NRAS wild type, MSI-stable -Liver biopsy showed metastatic adenocarcinoma. His tumor were strongly positive for CK20 and CD2, consistent with primary colorectal primary. KRAS and NRAS mutations were not detected.  -Pt understands that this is incurable cancer, and he has very high disease burden and overall prognosis is poor. The treatment goal is palliative -His  bilirubinemia has improved after first cycle chemotherapy.  -Giving the wild-type KRAS/NRAS genotype, he would benefit from EGFR targeted therapy. He started panitumumab from second cycle chemo. He had G2-3 skin rash with neutropenic fever, and was admitted for GI bleeding after second dose panitumumab. Will hold on it for now. May consider cetuximab with second line chemo. -Avastin contraindicated due to his gastric ulcer and bleeding in April 2016.  -I discussed his restaging CT scan from 7/22, which showed continue response. No new lesions. He is doing well and his tumor marker has come down dramatically, supporting good clinical response.  -Lab reviewed, mild thrombocytopenia, adequate for treatment we'll proceed with chemotherapy, no 5-FU bolus today.  -His anemia is much worse today, he is asymptomatic, I'll give 1 unit blood transfusion today and one more unit in 2 days -I'll check his stool for occult blood, no overt GI bleeding   2. Gastric ulcer with significant GI bleeding in April 2016 -He is on PPI. -He will follow-up with Dr. Fuller Plan  3. Newly diagnosed diabetes -HbA1c was 8.5, blood glucose 207 today -We again discussed diet and then close monitoring sugar at home. -Continue medication, follow up with primary care physician.  4. HTN, Dilated nonischemic ischemia cardiomyopathy with EF 25% -He is clinically doing well without symptoms of CHF. However this is probably going to impact his chemotherapy. I'll try to avoid cardiotoxic chemotherapy agent and avoid fluid overload during chemotherapy. -Continue follow-up with cardiology.  5 Hepatitis B carrier -Per liver clinic, no need for treatment. Follow-up with liver clinic.  7. Malnutrition -I encouraged him to eat more, and take supplements as needed. -follow up with Dietitian   8. Worsening Anemia -will check his iron level again -1u blood transfusion today and normal units in today's -Stool OB   lan -cycle 10 FOLFOX  today,  no 5-FU bolus -Return to clinic in 2 weeks with APP, 4 weeks with me  All questions were answered. The patient knows to call the clinic with any problems, questions or concerns.  I spent 25 minutes counseling the patient face to face. The total time spent in the appointment was 30 minutes and more than 50% was on counseling.     Truitt Merle, MD 05/24/2015   1:50 PM

## 2015-05-24 NOTE — Patient Instructions (Signed)
[place] Discharge Instructions for Patients Receiving Chemotherapy  Today you received the following chemotherapy agents: FOLFOX   To help prevent nausea and vomiting after your treatment, we encourage you to take your nausea medication as prescribed.    If you develop nausea and vomiting that is not controlled by your nausea medication, call the clinic.   BELOW ARE SYMPTOMS THAT SHOULD BE REPORTED IMMEDIATELY:  *FEVER GREATER THAN 100.5 F  *CHILLS WITH OR WITHOUT FEVER  NAUSEA AND VOMITING THAT IS NOT CONTROLLED WITH YOUR NAUSEA MEDICATION  *UNUSUAL SHORTNESS OF BREATH  *UNUSUAL BRUISING OR BLEEDING  TENDERNESS IN MOUTH AND THROAT WITH OR WITHOUT PRESENCE OF ULCERS  *URINARY PROBLEMS  *BOWEL PROBLEMS  UNUSUAL RASH Items with * indicate a potential emergency and should be followed up as soon as possible.  Feel free to call the clinic you have any questions or concerns. The clinic phone number is [phone].  Please show the [facility] at check-in to the Emergency Department and triage nurse.  Blood Transfusion Information WHAT IS A BLOOD TRANSFUSION? A transfusion is the replacement of blood or some of its parts. Blood is made up of multiple cells which provide different functions. 9. Red blood cells carry oxygen and are used for blood loss replacement. 10. White blood cells fight against infection. 11. Platelets control bleeding. 12. Plasma helps clot blood. 13. Other blood products are available for specialized needs, such as hemophilia or other clotting disorders. BEFORE THE TRANSFUSION  Who gives blood for transfusions?  2. You may be able to donate blood to be used at a later date on yourself (autologous donation). 3. Relatives can be asked to donate blood. This is generally not any safer than if you have received blood from a stranger. The same precautions are taken to ensure safety when a relative's blood is donated. 4. Healthy  volunteers who are fully evaluated to make sure their blood is safe. This is blood bank blood. Transfusion therapy is the safest it has ever been in the practice of medicine. Before blood is taken from a donor, a complete history is taken to make sure that person has no history of diseases nor engages in risky social behavior (examples are intravenous drug use or sexual activity with multiple partners). The donor's travel history is screened to minimize risk of transmitting infections, such as malaria. The donated blood is tested for signs of infectious diseases, such as HIV and hepatitis. The blood is then tested to be sure it is compatible with you in order to minimize the chance of a transfusion reaction. If you or a relative donates blood, this is often done in anticipation of surgery and is not appropriate for emergency situations. It takes many days to process the donated blood. RISKS AND COMPLICATIONS Although transfusion therapy is very safe and saves many lives, the main dangers of transfusion include:   Getting an infectious disease.  Developing a transfusion reaction. This is an allergic reaction to something in the blood you were given. Every precaution is taken to prevent this. The decision to have a blood transfusion has been considered carefully by your caregiver before blood is given. Blood is not given unless the benefits outweigh the risks. AFTER THE TRANSFUSION  Right after receiving a blood transfusion, you will usually feel much better and more energetic. This is especially true if your red blood cells have gotten low (anemic). The transfusion raises the level of the red blood cells which carry oxygen,  and this usually causes an energy increase.  The nurse administering the transfusion will monitor you carefully for complications. HOME CARE INSTRUCTIONS  No special instructions are needed after a transfusion. You may find your energy is better. Speak with your caregiver about any  limitations on activity for underlying diseases you may have. SEEK MEDICAL CARE IF:   Your condition is not improving after your transfusion.  You develop redness or irritation at the intravenous (IV) site. SEEK IMMEDIATE MEDICAL CARE IF:  Any of the following symptoms occur over the next 12 hours:  Shaking chills.  You have a temperature by mouth above 102 F (38.9 C), not controlled by medicine.  Chest, back, or muscle pain.  People around you feel you are not acting correctly or are confused.  Shortness of breath or difficulty breathing.  Dizziness and fainting.  You get a rash or develop hives.  You have a decrease in urine output.  Your urine turns a dark color or changes to pink, red, or brown. Any of the following symptoms occur over the next 10 days:  You have a temperature by mouth above 102 F (38.9 C), not controlled by medicine.  Shortness of breath.  Weakness after normal activity.  The white part of the eye turns yellow (jaundice).  You have a decrease in the amount of urine or are urinating less often.  Your urine turns a dark color or changes to pink, red, or brown. Document Released: 10/11/2000 Document Revised: 01/06/2012 Document Reviewed: 05/30/2008 [facility] Patient Information 2015 [place], [place]. This information is not intended to replace advice given to you by your health care provider. Make sure you discuss any questions you have with your health care provider.

## 2015-05-24 NOTE — Telephone Encounter (Signed)
Per staff message and POF I have scheduled appts. Advised scheduler of appts. JMW  

## 2015-05-24 NOTE — Progress Notes (Signed)
Patient instructed to come to [facility] at 1:00 where he will receive 2nd unit of RBC's, injection, and pump disconnect in infusion. Pt has been instructed to keep blue blood ID band on until he gets his second unit of blood. He verbalizes understanding.

## 2015-05-26 ENCOUNTER — Other Ambulatory Visit: Payer: Self-pay | Admitting: *Deleted

## 2015-05-26 ENCOUNTER — Ambulatory Visit: Payer: Medicare Other

## 2015-05-26 ENCOUNTER — Ambulatory Visit (HOSPITAL_BASED_OUTPATIENT_CLINIC_OR_DEPARTMENT_OTHER): Payer: Medicare Other

## 2015-05-26 VITALS — BP 116/55 | HR 94 | Temp 98.2°F | Resp 20

## 2015-05-26 DIAGNOSIS — D62 Acute posthemorrhagic anemia: Secondary | ICD-10-CM

## 2015-05-26 DIAGNOSIS — Z5189 Encounter for other specified aftercare: Secondary | ICD-10-CM

## 2015-05-26 DIAGNOSIS — C187 Malignant neoplasm of sigmoid colon: Secondary | ICD-10-CM | POA: Diagnosis present

## 2015-05-26 DIAGNOSIS — C189 Malignant neoplasm of colon, unspecified: Secondary | ICD-10-CM | POA: Diagnosis not present

## 2015-05-26 DIAGNOSIS — C787 Secondary malignant neoplasm of liver and intrahepatic bile duct: Principal | ICD-10-CM

## 2015-05-26 MED ORDER — SODIUM CHLORIDE 0.9 % IV SOLN
250.0000 mL | Freq: Once | INTRAVENOUS | Status: AC
  Administered 2015-05-26: 250 mL via INTRAVENOUS

## 2015-05-26 MED ORDER — HEPARIN SOD (PORK) LOCK FLUSH 100 UNIT/ML IV SOLN
500.0000 [IU] | Freq: Once | INTRAVENOUS | Status: DC | PRN
  Filled 2015-05-26: qty 5

## 2015-05-26 MED ORDER — ACETAMINOPHEN 325 MG PO TABS
650.0000 mg | ORAL_TABLET | Freq: Once | ORAL | Status: AC
  Administered 2015-05-26: 650 mg via ORAL

## 2015-05-26 MED ORDER — FUROSEMIDE 10 MG/ML IJ SOLN
20.0000 mg | Freq: Once | INTRAMUSCULAR | Status: AC
  Administered 2015-05-26: 20 mg via INTRAVENOUS

## 2015-05-26 MED ORDER — DIPHENHYDRAMINE HCL 25 MG PO CAPS
25.0000 mg | ORAL_CAPSULE | Freq: Once | ORAL | Status: AC
  Administered 2015-05-26: 25 mg via ORAL

## 2015-05-26 MED ORDER — SODIUM CHLORIDE 0.9 % IJ SOLN
10.0000 mL | INTRAMUSCULAR | Status: DC | PRN
  Administered 2015-05-26: 10 mL
  Filled 2015-05-26: qty 10

## 2015-05-26 MED ORDER — SODIUM CHLORIDE 0.9 % IJ SOLN
10.0000 mL | INTRAMUSCULAR | Status: DC | PRN
  Filled 2015-05-26: qty 10

## 2015-05-26 MED ORDER — HEPARIN SOD (PORK) LOCK FLUSH 100 UNIT/ML IV SOLN
500.0000 [IU] | Freq: Every day | INTRAVENOUS | Status: AC | PRN
  Administered 2015-05-26: 500 [IU]
  Filled 2015-05-26: qty 5

## 2015-05-26 MED ORDER — DIPHENHYDRAMINE HCL 25 MG PO CAPS
ORAL_CAPSULE | ORAL | Status: AC
  Filled 2015-05-26: qty 1

## 2015-05-26 MED ORDER — ACETAMINOPHEN 325 MG PO TABS
ORAL_TABLET | ORAL | Status: AC
  Filled 2015-05-26: qty 2

## 2015-05-26 MED ORDER — PEGFILGRASTIM INJECTION 6 MG/0.6ML ~~LOC~~
6.0000 mg | PREFILLED_SYRINGE | Freq: Once | SUBCUTANEOUS | Status: AC
  Administered 2015-05-26: 6 mg via SUBCUTANEOUS
  Filled 2015-05-26: qty 0.6

## 2015-05-26 NOTE — Patient Instructions (Signed)

## 2015-05-26 NOTE — Progress Notes (Signed)
Pt brought in occult stool sample which was not done correctly.  Pt instructed on how to obtain stool sample via interpreter.  Pt has no questions at this time and verbalized understanding of obtaining sample to interpreter.

## 2015-05-29 LAB — TYPE AND SCREEN
ABO/RH(D): O POS
Antibody Screen: NEGATIVE
UNIT DIVISION: 0
Unit division: 0

## 2015-05-30 NOTE — Addendum Note (Signed)
Addended by: Adalberto Cole on: 05/30/2015 10:39 AM   Modules accepted: Orders

## 2015-06-05 ENCOUNTER — Telehealth: Payer: Self-pay | Admitting: Hematology

## 2015-06-05 NOTE — Telephone Encounter (Signed)
Faxed pt medical records to [facility] [phone]

## 2015-06-06 ENCOUNTER — Telehealth: Payer: Self-pay | Admitting: Hematology

## 2015-06-06 ENCOUNTER — Other Ambulatory Visit (HOSPITAL_BASED_OUTPATIENT_CLINIC_OR_DEPARTMENT_OTHER): Payer: Medicare Other

## 2015-06-06 ENCOUNTER — Ambulatory Visit: Payer: Medicare Other | Admitting: Nurse Practitioner

## 2015-06-06 ENCOUNTER — Encounter: Payer: Self-pay | Admitting: Hematology

## 2015-06-06 ENCOUNTER — Ambulatory Visit (HOSPITAL_BASED_OUTPATIENT_CLINIC_OR_DEPARTMENT_OTHER): Payer: Medicare Other | Admitting: Hematology

## 2015-06-06 VITALS — BP 132/73 | HR 91 | Temp 98.2°F | Ht 64.25 in | Wt 143.7 lb

## 2015-06-06 DIAGNOSIS — K250 Acute gastric ulcer with hemorrhage: Secondary | ICD-10-CM

## 2015-06-06 DIAGNOSIS — C187 Malignant neoplasm of sigmoid colon: Secondary | ICD-10-CM

## 2015-06-06 DIAGNOSIS — C787 Secondary malignant neoplasm of liver and intrahepatic bile duct: Secondary | ICD-10-CM

## 2015-06-06 DIAGNOSIS — E119 Type 2 diabetes mellitus without complications: Secondary | ICD-10-CM

## 2015-06-06 DIAGNOSIS — C786 Secondary malignant neoplasm of retroperitoneum and peritoneum: Secondary | ICD-10-CM | POA: Diagnosis not present

## 2015-06-06 DIAGNOSIS — C772 Secondary and unspecified malignant neoplasm of intra-abdominal lymph nodes: Secondary | ICD-10-CM | POA: Diagnosis not present

## 2015-06-06 DIAGNOSIS — C7800 Secondary malignant neoplasm of unspecified lung: Secondary | ICD-10-CM | POA: Diagnosis not present

## 2015-06-06 DIAGNOSIS — I10 Essential (primary) hypertension: Secondary | ICD-10-CM

## 2015-06-06 DIAGNOSIS — C189 Malignant neoplasm of colon, unspecified: Secondary | ICD-10-CM

## 2015-06-06 DIAGNOSIS — C7972 Secondary malignant neoplasm of left adrenal gland: Secondary | ICD-10-CM

## 2015-06-06 DIAGNOSIS — D509 Iron deficiency anemia, unspecified: Secondary | ICD-10-CM

## 2015-06-06 LAB — CBC WITH DIFFERENTIAL/PLATELET
BASO%: 0.2 % (ref 0.0–2.0)
Basophils Absolute: 0 10*3/uL (ref 0.0–0.1)
EOS%: 1.3 % (ref 0.0–7.0)
Eosinophils Absolute: 0.2 10*3/uL (ref 0.0–0.5)
HCT: 25.3 % — ABNORMAL LOW (ref 38.4–49.9)
HEMOGLOBIN: 8.2 g/dL — AB (ref 13.0–17.1)
LYMPH%: 6.5 % — ABNORMAL LOW (ref 14.0–49.0)
MCH: 28.2 pg (ref 27.2–33.4)
MCHC: 32.4 g/dL (ref 32.0–36.0)
MCV: 87 fL (ref 79.3–98.0)
MONO#: 1 10*3/uL — ABNORMAL HIGH (ref 0.1–0.9)
MONO%: 8.4 % (ref 0.0–14.0)
NEUT#: 10 10*3/uL — ABNORMAL HIGH (ref 1.5–6.5)
NEUT%: 83.6 % — ABNORMAL HIGH (ref 39.0–75.0)
Platelets: 114 10*3/uL — ABNORMAL LOW (ref 140–400)
RBC: 2.91 10*6/uL — ABNORMAL LOW (ref 4.20–5.82)
RDW: 18.6 % — ABNORMAL HIGH (ref 11.0–14.6)
WBC: 11.9 10*3/uL — ABNORMAL HIGH (ref 4.0–10.3)
lymph#: 0.8 10*3/uL — ABNORMAL LOW (ref 0.9–3.3)

## 2015-06-06 LAB — COMPREHENSIVE METABOLIC PANEL (CC13)
ALT: 30 U/L (ref 0–55)
AST: 43 U/L — ABNORMAL HIGH (ref 5–34)
Albumin: 2.8 g/dL — ABNORMAL LOW (ref 3.5–5.0)
Alkaline Phosphatase: 293 U/L — ABNORMAL HIGH (ref 40–150)
Anion Gap: 5 meq/L (ref 3–11)
BUN: 10 mg/dL (ref 7.0–26.0)
CO2: 25 meq/L (ref 22–29)
Calcium: 8.1 mg/dL — ABNORMAL LOW (ref 8.4–10.4)
Chloride: 108 meq/L (ref 98–109)
Creatinine: 1 mg/dL (ref 0.7–1.3)
EGFR: 79 ml/min/1.73 m2 — ABNORMAL LOW
Glucose: 150 mg/dL — ABNORMAL HIGH (ref 70–140)
Potassium: 3.9 meq/L (ref 3.5–5.1)
Sodium: 138 meq/L (ref 136–145)
Total Bilirubin: 0.55 mg/dL (ref 0.20–1.20)
Total Protein: 6.9 g/dL (ref 6.4–8.3)

## 2015-06-06 LAB — FECAL OCCULT BLOOD, GUAIAC: OCCULT BLOOD: POSITIVE

## 2015-06-06 MED ORDER — NADOLOL 20 MG PO TABS: 20.0000 mg | ORAL_TABLET | Freq: Two times a day (BID) | ORAL | Status: DC

## 2015-06-06 NOTE — Telephone Encounter (Signed)
per staff message from Dr Burr Medico to [facility] from [place] to hers-respondeded and adv done and will overbook sch

## 2015-06-06 NOTE — Progress Notes (Signed)
[place]  Telephone:([phone]) [phone] Fax:([phone]) [place] Note   Patient Care Team: [name], MD as PCP - General (Specialist) [name], CRNP as Nurse Practitioner (Nurse Practitioner) Truitt Merle, MD as Consulting Physician (Hematology) 06/06/2015  CHIEF COMPLAINTS Metastatic sigmoid colon cancer  Oncology History   He is a Metastatic colon cancer to liver   Staging form: Colon and Rectum, AJCC 7th Edition     Clinical: Stage Unknown ([place], NX, M1) - Unsigned  Our health is     Metastatic colon cancer to liver   10/28/2014 Tumor Marker AFP 3.2 CEA > 10,000 CA 19-9 12,929.6. tumor (-) KRAS and NRAS mutation.    11/10/2014 Imaging PET scan showed hypermetabolic mass in the sigmoid colon was noted metastasis in the retroperitoneum. Probable left adrenal and pulmonary metastasis, and diffuse liver metastasis.   11/21/2014 Initial Diagnosis Metastatic colon cancer to liver, lung, abd nodes and left adrenal gland. Diagnosis was made by liver biopsy.    11/30/2014 -  Chemotherapy First line chemo mFOLFOX6, Panitumumab added from second cycle    12/28/2014 - 12/31/2014 Hospital Admission Was admitted for dehydration, neutropenia fever with UTI, and severe skin rash.   02/08/2015 - 02/12/2015 Hospital Admission He was admitted for upper GI bleeding, e.g. showed a gastric ulcer with clots, status post appendectomy injection. He also received a blood transfusion.   03/01/2015 Tumor Marker CEA 694, CA19.9 462   03/13/2015 Imaging CT CAP showed partial response, no new lesions.    03/15/2015 -  Chemotherapy restart FOLFOX     CURRENT THERAPY: mFOLFOX6 started on 11/30/2014, Panitumumab added to second cycle on 12/14/14, 50% dose reduction with cycle 4, then stopped due to possible AEs  HISTORY OF PRESENTING ILLNESS:  Jason Reilly 69 y.o. male is here because of abnormal CT findings, which is very suspicious for malignancy. He is on ranitidine from [place], has been on in  the [place] for 16 years. He came in with his son and an interpreter.  He has been feeling fatigued since two month ago. He is still able to do all ADLs. He otherwise denies any pain, bloating or nausea.  He lost about 20lbs in 3 month. His appetite is lower than before, eats less, no change of his bowl habits.  She denied any hematochezia or melana. Per his son, he has had some personality changes daily, irritable, slightly confused some time.  He was evaluated by his primary care physician. Lab test reviewed hepatitis B infection, which he did not know before, and elevated alkaline phosphatase, his liver function and the rest of the liver function was not remarkable. [place] of abdomen was obtained on 07/22/2014, which showed diffusely abnormal liver with multiple echogenic lesions. CT of abdomen with and without contrast was done on 08/26/2014, which reviewed here at a medically with multiple large partially calcified hepatic masses consistent with metastatic disease. Mild retroperitoneal adenopathy with the largest node measuring 1.6 cm. And nonspecific 1.4 cm left adrenal nodule was also noticed. His tumor marker showed CEA greater than 10,000, CA 19-9 12,929, AFP 3.2 (normal). He was referred to [facility] and was evaluated by nurse practitioner [name]. Treatment for hepatitis B was not recommended based on his virus load.  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 He was evaluated by a cardiologist in 2014. He denies any significant dyspnea on exertion. No leg swollen.  INTERIM HISTORY: Jason Reilly returns for follow-up. He  went to [place] for a church activity last week, had an episode of syncope in the church, and was admitted to local hospital. He was found to have hemoglobin 5.7, probable from GI bleeding, and had EGD done which showed a gastric and 2 duodenum ulcers. He received blood transfusion, PPI, iron pill, and he left the  hospital the next day. He states he feels well, denies any abdominal pain, nausea, or other symptoms. His problem is normal, he denies melena or hematochezia. He did bring [place] a stool sample for test today.  MEDICAL HISTORY:  Past Medical History  Diagnosis Date  . Tachycardia   . Abnormal EKG   . Hypertension   . Non-ischemic cardiomyopathy   . Hepatitis B   . H. pylori infection   . Gastric ulcer   . Metastatic colon cancer to liver   . Diabetes mellitus without complication     metformin    SURGICAL HISTORY: Past Surgical History  Procedure Laterality Date  . Nuclear stress test  03/03/2013    High risk - consistent with nonischemic cardiomyopathy  . Left and right heart catheterization with coronary angiogram N/A 03/29/2013    Procedure: LEFT AND RIGHT HEART CATHETERIZATION WITH CORONARY ANGIOGRAM;  Surgeon: Pixie Casino, MD;  Location[facility] CATH LAB;  Service: Cardiovascular;  Laterality: N/A;  . Esophagogastroduodenoscopy N/A 02/08/2015    Procedure: ESOPHAGOGASTRODUODENOSCOPY (EGD);  Surgeon: Ladene Artist, MD;  Location: [facility] END[facility];  Service: Endoscopy;  Laterality: N/A;    SOCIAL HISTORY: History   Social History  . Marital Status: Married    Spouse Name: N/A  . Number of Children: N/A  . Years of Education: N/A   Occupational History  . Not on file.   Social History Main Topics  . Smoking status: Former Research scientist (life sciences)  . Smokeless tobacco: Never Used  . Alcohol Use: Yes     Comment: occasional  . Drug Use: No  . Sexual Activity: No   Other Topics Concern  . Not on file   Social History Narrative    FAMILY HISTORY: No family history of liver disease or malignancy.  ALLERGIES:  has No Known Allergies.  MEDICATIONS:  Current Outpatient Prescriptions on File Prior to Visit  Medication Sig Dispense Refill  . clindamycin (CLINDAGEL) 1 % gel Apply topically 2 (two) times daily. 30 g 2  . hydrocortisone 2.5 % cream Apply topically 2 (two) times daily. Once  daily on face and twice daily on body 30 g 2  . lidocaine-prilocaine (EMLA) cream Apply 1 application topically as needed. Apply to [facility] cath at least one hour before needle stick. 30 g 2  . linagliptin (TRADJENTA) 5 MG TABS tablet Take 1 tablet (5 mg total) by mouth daily. 30 tablet 1  . lisinopril-hydrochlorothiazide (PRINZIDE,ZESTORETIC) 10-12.5 MG per tablet Take 1 tablet by mouth daily.  3  . loperamide (IMODIUM A-D) 2 MG tablet Take 2 mg by mouth 4 (four) times daily as needed for diarrhea or loose stools.    . metFORMIN (GLUCOPHAGE) 500 MG tablet Take by mouth 2 (two) times daily with a meal.    . mirtazapine (REMERON) 15 MG tablet Take 1 tablet (15 mg total) by mouth at bedtime. 30 tablet 2  . ondansetron (ZOFRAN) 8 MG tablet Take 1 tablet (8 mg total) by mouth every 8 (eight) hours as needed for nausea or vomiting. 45 tablet 0  . pantoprazole (PROTONIX) 40 MG tablet Take 1 tablet (40 mg total) by mouth daily. 30 tablet 11  .  prochlorperazine (COMPAZINE) 10 MG tablet Take 1 tablet (10 mg total) by mouth every 6 (six) hours as needed for nausea or vomiting. 30 tablet 1   No current facility-administered medications on file prior to visit.  ;   REVIEW OF SYSTEMS:   Constitutional: Denies fevers, chills or abnormal night sweats, (+) fatigue  Eyes: Denies blurriness of vision, double vision or watery eyes Ears, nose, mouth, throat, and face: Denies mucositis or sore throat Respiratory: Denies cough, dyspnea or wheezes Cardiovascular: Denies palpitation, chest discomfort or lower extremity swelling Gastrointestinal: Denies nausea, heartburn or change in bowel habits Skin: Denies abnormal skin rashes Lymphatics: Denies new lymphadenopathy or easy bruising Neurological:Denies numbness, tingling or new weaknesses Behavioral/Psych: Mood is stable, no new changes, (+) insomnia All other systems were reviewed with the patient and are negative.  PHYSICAL EXAMINATION: BP 132/73 mmHg  Pulse  91  Temp(Src) 98.2 F (36.8 C) (Oral)  Ht 5' 4.25" (1.632 m)  Wt 143 lb 11.2 oz (65.182 kg)  BMI 24.47 kg/m2  SpO2 100%  ECOG PERFORMANCE STATUS: 3 Vital sign were taken at in the infusion room, within normal limits. GENERAL:alert, no distress and comfortable SKIN: (+) Dry skin, scatter acne like skin rash es on the face, neck and trunk, better than last time, no skin ulcer or discharge. EYES: normal, conjunctiva are pink and non-injected, sclera clear OROPHARYNX:no exudate, no erythema and lips, buccal mucosa, and tongue normal  NECK: supple, thyroid normal size, non-tender, without nodularity LYMPH:  no palpable lymphadenopathy in the cervical, axillary or inguinal LUNGS: clear to auscultation and percussion with normal breathing effort, (+) crackles on b/l lung base  HEART: regular rate & rhythm and no murmurs and no lower extremity edema ABDOMEN:abdomen soft, non-tender, no hepatomegaly, no splenomegaly and normal bowel sounds Musculoskeletal:no cyanosis of digits and no clubbing  PSYCH: alert & oriented x 3 with fluent speech NEURO: no focal motor/sensory deficits  LABORATORY DATA:  I have reviewed the data as listed CBC Latest Ref Rng 06/06/2015 05/24/2015 05/09/2015  WBC 4.0 - 10.3 10e3/uL 11.9(H) 12.5(H) 10.0  Hemoglobin 13.0 - 17.1 g/dL 8.2(L) 6.7(LL) 10.1(L)  Hematocrit 38.4 - 49.9 % 25.3(L) 20.7(L) 31.3(L)  Platelets 140 - 400 10e3/uL 114(L) 112(L) 141    CMP Latest Ref Rng 06/06/2015 05/24/2015 05/09/2015  Glucose 70 - 140 mg/dl 150(H) 207(H) 308(H)  BUN 7.0 - 26.0 mg/dL 10.0 10.8 9.8  Creatinine 0.7 - 1.3 mg/dL 1.0 0.8 1.1  Sodium 136 - 145 mEq/L 138 134(L) 136  Potassium 3.5 - 5.1 mEq/L 3.9 4.0 4.2  Chloride 96 - 112 mmol/L - - -  CO2 22 - 29 mEq/L _0 Calcium 8.4 - 10.4 mg/dL 8.1(L) 8.3(L) 9.5  Total Protein 6.4 - 8.3 g/dL 6.9 7.3 8.3  Total Bilirubin 0.20 - 1.20 mg/dL 0.55 0.45 0.42  Alkaline Phos 40 - 150 U/L 293(H) 273(H) 331(H)  AST 5 - 34 U/L 43(H) 49(H)  43(H)  ALT 0 - 55 U/L 30 33 30     INITIAL tumor markers AFP 3.2 CEA > 10,000 CA 19-9 12,929.6  Results for TREVONE, PRESTWOOD (MRN [id number]) as of 05/24/2015 13:50  Ref. Range 05/09/2015 13:45  CA 19-9 Latest Ref Range: <35.0 U/mL 164.7 (H)  CEA Latest Ref Range: 0.0-5.0 ng/mL 249.5 (H)    Pathology report  Liver, needle/core biopsy - METASTATIC ADENOCARCINOMA, SEE COMMENT. Microscopic Comment The adenocarcinoma demonstrates the following immunophenotype: Cytokeratin 7 - negative expression. Cytokeratin 20 - strong diffuse expression. CD2 - strong diffuse  expression. Overall the morphology and immunophenotype are that of metastatic adenocarcinoma primary to colorectum. The recent nuclear medicine scan demonstrating sigmoid mass with associated liver masses is noted.      RADIOGRAPHIC STUDIES: I have personally reviewed the outside CT scan image with patient and his son.   CT chest, abdomen and pelvis with IV contrast on 05/19/2015. IMPRESSION: Mild residual wall thickening at the site of known sigmoid colonic adenocarcinoma.  Improving nodal metastases, as described above, including a dominant 11 mm left para-aortic node.  Improving multifocal hepatic metastases, as described above.  Small bilateral adrenal metastases.  Near complete resolution of prior calcified pulmonary metastases.  EGD in [facility] [place] Grade 2 versus was found in the low cervical esophagus, they will 5 mm in largest diameter.  Mild portal hypertension gets dropsy was found in the gastric fundus One oozing cratered gastric ulcer was seen at the incisura and gastric antrum, 7X27m 2 nonbleeding superficial duodenal ulcers with no stigmata of bleeding was found in the duodenal bowel  ASSESSMENT & PLAN:  69year old [place], with past history of hypertension and dilated nonischemic gammopathy with EF 25%, no clinical signs of heart failure, who was found to have hepatitis B  infection lately, and multiple liver lesions on the CT scan. He has extremely high CEA and CA 19-9 levels. PET scan reviewed a hypermetabolic sigmoid colon mass, diffuse liver metastasis, probable lung and adrenal gland metastasis.  1. Metastatic sigmoid colon cancer, with diffuse liver, lungs, node and left adrenal gland metastases. KRAS/NRAS wild type, MSI-stable -Liver biopsy showed metastatic adenocarcinoma. His tumor were strongly positive for CK20 and CD2, consistent with primary colorectal primary. KRAS and NRAS mutations were not detected.  -Pt understands that this is incurable cancer, and he has very high disease burden and overall prognosis is poor. The treatment goal is palliative -His bilirubinemia has improved after first cycle chemotherapy.  -Giving the wild-type KRAS/NRAS genotype, he would benefit from EGFR targeted therapy. He started panitumumab from second cycle chemo. He had G2-3 skin rash with neutropenic fever, and was admitted for GI bleeding after second dose panitumumab. Will hold on it for now. May consider cetuximab with second line chemo. -Avastin contraindicated due to his gastric ulcer and bleeding in April 2016 and Aug 2016  -I discussed his restaging CT scan from 7/22, which showed continue response. No new lesions. He is doing well and his tumor marker has come down dramatically, supporting good clinical response.  -Due to the second episode of upper GI bleeding and ulcers, I'll hold his chemotherapy, until his ulcers are healed  -Due to the possible relationship of chemotherapy and upper GI ulcer in the bleeding, I probably will change his chemotherapy treatment to Irrinotecan and panitumumab   2. Gastric ulcer with significant GI bleeding in April and Aug 2016 -He is on PPI, continue twice daily  -He needs to follow-up with Dr. SFuller Planfor repeated EGD in a month -He was [place] I give him a  description and asked him to start today, for his portal hypertension   3. Newly diagnosed diabetes -HbA1c was 8.5, blood glucose not well controlled  -We again discussed diet and then close monitoring sugar at home. -Continue medication, follow up with primary care physician.  4. HTN, Dilated nonischemic ischemia cardiomyopathy with EF 25% -He is clinically doing well without symptoms of CHF. However this is probably going to impact his chemotherapy. I'll  try to avoid cardiotoxic chemotherapy agent and avoid fluid overload during chemotherapy. -Continue follow-up with cardiology.  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 Follow-up with liver clinic.  7. Malnutrition -I encouraged him to eat more, and take supplements as needed. -follow up with Dietitian   8. Worsening Anemia secondary to GI bleeding and iron deficiency  -will check his iron level again today  -CBC weekly for the next 4 weeks   Plan -hold on chemo for now -f/u with GI Dr. Fuller Plan asap, I sent a message to him today  -Weekly CBC, blood transfusion if his hemoglobin below 8  I'll see him back in 2 weeks  All questions were answered. The patient knows to call the clinic with any problems, questions or concerns.  I spent 30 minutes counseling the patient face to face. The total time spent in the appointment was 40 minutes and more than 50% was on counseling.     Truitt Merle, MD 06/06/2015   3:57 PM

## 2015-06-06 NOTE — Telephone Encounter (Signed)
Gave adn printed appt sched and avs fo rpt for Aug and sept °

## 2015-06-06 NOTE — Telephone Encounter (Signed)
per Dr Burr Medico to move pt to her sch-pt moved

## 2015-06-07 ENCOUNTER — Telehealth: Payer: Self-pay

## 2015-06-07 ENCOUNTER — Ambulatory Visit: Payer: Medicare Other

## 2015-06-07 DIAGNOSIS — D509 Iron deficiency anemia, unspecified: Secondary | ICD-10-CM | POA: Insufficient documentation

## 2015-06-07 LAB — IRON AND TIBC CHCC
%SAT: 8 % — ABNORMAL LOW (ref 20–55)
Iron: 24 ug/dL — ABNORMAL LOW (ref 42–163)
TIBC: 301 ug/dL (ref 202–409)
UIBC: 277 ug/dL (ref 117–376)

## 2015-06-07 LAB — CANCER ANTIGEN 19-9: CA 19-9: 94.4 U/mL — ABNORMAL HIGH (ref <35.0)

## 2015-06-07 LAB — FERRITIN CHCC: Ferritin: 117 ng/ml (ref 22–316)

## 2015-06-07 LAB — CEA: CEA: 90.7 ng/mL — ABNORMAL HIGH (ref 0.0–5.0)

## 2015-06-07 NOTE — Telephone Encounter (Signed)
-----   Message from Tania Ade, RN sent at 06/06/2015  4:10 PM EDT ----- Barbera Setters, [name] sent this message to Dr. Fuller Plan. Please be sure he sees it. Thanks, Merceda Elks, RN  ----- Message -----    From: Truitt Merle, MD    Sent: 06/06/2015   3:50 PM      To: Ladene Artist, MD, Tania Ade, RN  Dr. Fuller Plan,  You scoped this pt for gastric ulcer and GI bleeding. I restarted his chemo after he recovered. He unfortunately had GI bleeding again lately and required 2 blood transfusion in the past 2-3 weeks, and was admitted to a local hospital last week when he was in [place]. He has EGD which showed multiple ulcers.   Could you see you back and follow up? I will hold chemo for now. I you can repeat EGD again in one month and ulcers healed, I may restart chemo  Thanks mcuh   Truitt Merle

## 2015-06-07 NOTE — Telephone Encounter (Signed)
I contacted the patient and spoke with a male family member and advised of follow up office visit with Dr. Fuller Plan on 06/14/15 3:00.  I asked that they bring the records if they have them from the hospitalization in [place].

## 2015-06-08 ENCOUNTER — Telehealth: Payer: Self-pay | Admitting: Hematology and Oncology

## 2015-06-08 ENCOUNTER — Telehealth: Payer: Self-pay | Admitting: Hematology

## 2015-06-08 NOTE — Telephone Encounter (Signed)
Iron added per pof and a calendar has been mailed to the patient

## 2015-06-09 ENCOUNTER — Ambulatory Visit: Payer: Medicare Other

## 2015-06-13 ENCOUNTER — Other Ambulatory Visit: Payer: Self-pay | Admitting: Hematology

## 2015-06-13 ENCOUNTER — Ambulatory Visit (HOSPITAL_BASED_OUTPATIENT_CLINIC_OR_DEPARTMENT_OTHER): Payer: Medicare Other

## 2015-06-13 ENCOUNTER — Other Ambulatory Visit (HOSPITAL_BASED_OUTPATIENT_CLINIC_OR_DEPARTMENT_OTHER): Payer: Medicare Other

## 2015-06-13 VITALS — BP 144/69 | HR 57 | Temp 97.7°F | Resp 18

## 2015-06-13 DIAGNOSIS — D509 Iron deficiency anemia, unspecified: Secondary | ICD-10-CM | POA: Diagnosis not present

## 2015-06-13 DIAGNOSIS — C187 Malignant neoplasm of sigmoid colon: Secondary | ICD-10-CM | POA: Diagnosis present

## 2015-06-13 DIAGNOSIS — E119 Type 2 diabetes mellitus without complications: Secondary | ICD-10-CM | POA: Diagnosis not present

## 2015-06-13 DIAGNOSIS — C787 Secondary malignant neoplasm of liver and intrahepatic bile duct: Secondary | ICD-10-CM | POA: Diagnosis not present

## 2015-06-13 DIAGNOSIS — C189 Malignant neoplasm of colon, unspecified: Secondary | ICD-10-CM

## 2015-06-13 LAB — COMPREHENSIVE METABOLIC PANEL (CC13)
ALT: 39 U/L (ref 0–55)
AST: 71 U/L — ABNORMAL HIGH (ref 5–34)
Albumin: 2.9 g/dL — ABNORMAL LOW (ref 3.5–5.0)
Alkaline Phosphatase: 261 U/L — ABNORMAL HIGH (ref 40–150)
Anion Gap: 5 mEq/L (ref 3–11)
BUN: 7.4 mg/dL (ref 7.0–26.0)
CO2: 26 meq/L (ref 22–29)
Calcium: 8.8 mg/dL (ref 8.4–10.4)
Chloride: 106 mEq/L (ref 98–109)
Creatinine: 0.8 mg/dL (ref 0.7–1.3)
EGFR: >90 mL/min/{1.73_m2} (ref >90)
Glucose: 129 mg/dl (ref 70–140)
Potassium: 3.9 mEq/L (ref 3.5–5.1)
Sodium: 137 mEq/L (ref 136–145)
Total Bilirubin: 0.53 mg/dL (ref 0.20–1.20)
Total Protein: 7.4 g/dL (ref 6.4–8.3)

## 2015-06-13 LAB — CBC WITH DIFFERENTIAL/PLATELET
BASO%: 1 % (ref 0.0–2.0)
Basophils Absolute: 0.1 10*3/uL (ref 0.0–0.1)
EOS%: 1.5 % (ref 0.0–7.0)
Eosinophils Absolute: 0.1 10*3/uL (ref 0.0–0.5)
HCT: 29.6 % — ABNORMAL LOW (ref 38.4–49.9)
HGB: 9.6 g/dL — ABNORMAL LOW (ref 13.0–17.1)
LYMPH%: 11.6 % — AB (ref 14.0–49.0)
MCH: 28 pg (ref 27.2–33.4)
MCHC: 32.4 g/dL (ref 32.0–36.0)
MCV: 86.2 fL (ref 79.3–98.0)
MONO#: 0.7 10*3/uL (ref 0.1–0.9)
MONO%: 8.9 % (ref 0.0–14.0)
NEUT#: 5.8 10*3/uL (ref 1.5–6.5)
NEUT%: 77 % — AB (ref 39.0–75.0)
Platelets: 180 10*3/uL (ref 140–400)
RBC: 3.43 10*6/uL — AB (ref 4.20–5.82)
RDW: 18.3 % — ABNORMAL HIGH (ref 11.0–14.6)
WBC: 7.5 10*3/uL (ref 4.0–10.3)
lymph#: 0.9 10*3/uL (ref 0.9–3.3)

## 2015-06-13 MED ORDER — SODIUM CHLORIDE 0.9 % IJ SOLN
10.0000 mL | INTRAMUSCULAR | Status: DC | PRN
  Administered 2015-06-13: 10 mL
  Filled 2015-06-13: qty 10

## 2015-06-13 MED ORDER — HEPARIN SOD (PORK) LOCK FLUSH 100 UNIT/ML IV SOLN
500.0000 [IU] | Freq: Once | INTRAVENOUS | Status: AC | PRN
  Administered 2015-06-13: 500 [IU]
  Filled 2015-06-13: qty 5

## 2015-06-13 MED ORDER — SODIUM CHLORIDE 0.9 % IV SOLN
510.0000 mg | Freq: Once | INTRAVENOUS | Status: AC
  Administered 2015-06-13: 510 mg via INTRAVENOUS
  Filled 2015-06-13: qty 17

## 2015-06-13 MED ORDER — SODIUM CHLORIDE 0.9 % IV SOLN
Freq: Once | INTRAVENOUS | Status: AC
  Administered 2015-06-13: 09:00:00 via INTRAVENOUS

## 2015-06-13 NOTE — Patient Instructions (Signed)

## 2015-06-14 ENCOUNTER — Ambulatory Visit: Payer: Medicare Other | Admitting: Gastroenterology

## 2015-06-14 ENCOUNTER — Ambulatory Visit (INDEPENDENT_AMBULATORY_CARE_PROVIDER_SITE_OTHER): Payer: Medicare Other | Admitting: Gastroenterology

## 2015-06-14 ENCOUNTER — Encounter: Payer: Self-pay | Admitting: Gastroenterology

## 2015-06-14 VITALS — BP 132/72 | HR 57 | Ht 65.0 in | Wt 137.2 lb

## 2015-06-14 DIAGNOSIS — C787 Secondary malignant neoplasm of liver and intrahepatic bile duct: Secondary | ICD-10-CM

## 2015-06-14 DIAGNOSIS — I8500 Esophageal varices without bleeding: Secondary | ICD-10-CM

## 2015-06-14 DIAGNOSIS — K250 Acute gastric ulcer with hemorrhage: Secondary | ICD-10-CM

## 2015-06-14 DIAGNOSIS — C189 Malignant neoplasm of colon, unspecified: Secondary | ICD-10-CM | POA: Diagnosis not present

## 2015-06-14 DIAGNOSIS — I429 Cardiomyopathy, unspecified: Secondary | ICD-10-CM

## 2015-06-14 NOTE — Patient Instructions (Signed)
You have been scheduled for an endoscopy. Please follow written instructions given to you at your visit today. If you use inhalers (even only as needed), please bring them with you on the day of your procedure.  Thank you for choosing me and [facility].  Malcolm T. Stark, Jr., MD., FACG  

## 2015-06-14 NOTE — Progress Notes (Signed)
    History of Present Illness: This is a 69 year old [place] male accompanied by his son and a Optometrist. He has metastatic colon cancer to the liver and has undergone chemotherapy. He has a nonischemic cardiomyopathy with 25% ejection fraction, hepatitis B with presumed cirrhosis and a history of a gastric ulcer with bleeding in April 2016. He was hospitalized in early August in [place], [place] for an upper GI bleed. EGD revealed 2 nonbleeding duodenal ulcers 1 gastric ulcer with stigmata of recent bleeding treated with BiCAP, portal gastropathy and grade 2 esophageal varices. He relates no recurrent bleeding and has felt better over the past 2 weeks. Hemoglobin 3 weeks ago was 6.7 and was 9.6 yesterday.  Current Medications, Allergies, Past Medical History, Past Surgical History, Family History and Social History were reviewed in [employer] record.  Physical Exam: General: Well developed , well nourished, no acute distress Head: Normocephalic and atraumatic Eyes:  sclerae anicteric, EOMI Ears: Normal auditory acuity Mouth: No deformity or lesions Lungs: Clear throughout to auscultation Heart: Regular rate and rhythm; no murmurs, rubs or bruits Abdomen: Soft, non tender and non distended. No masses, hepatosplenomegaly or hernias noted. Normal Bowel sounds Musculoskeletal: Symmetrical with no gross deformities  Pulses:  Normal pulses noted Extremities: No clubbing, cyanosis, edema or deformities noted Neurological: Alert oriented x 4, grossly nonfocal Psychological:  Alert and cooperative. Normal mood and affect  Assessment and Recommendations:  1. Gastric ulcer with bleed. Duodenal ulcers without bleeding. Protonix 40 mg daily long term. Avoid ASA/NSAIDs long term. EGD to document ulcer healing in 2 months. The risks (including bleeding, perforation, infection, missed lesions, medication reactions and possible hospitalization or surgery if complications occur),  benefits, and alternatives to endoscopy with possible biopsy and possible dilation were discussed with the patient and they consent to proceed.   2. Esophageal varices, nonbleeding. 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 Portal gastropathy. Continue Nadolol 20 mg bid.   3. Anemia secondary to acute blood loss. Per oncology.   4. Metastatic colon cancer to the liver. Per oncology.   5. Cardiomyopathy with ejection fraction 25%.

## 2015-06-21 ENCOUNTER — Other Ambulatory Visit (HOSPITAL_COMMUNITY)
Admission: RE | Admit: 2015-06-21 | Discharge: 2015-06-21 | Disposition: A | Discharge status: Home / Self Care | Payer: Medicare Other | Source: Ambulatory Visit | Attending: Hematology | Admitting: Hematology

## 2015-06-21 ENCOUNTER — Other Ambulatory Visit: Payer: Medicare Other

## 2015-06-21 ENCOUNTER — Ambulatory Visit: Payer: Medicare Other

## 2015-06-21 ENCOUNTER — Ambulatory Visit (HOSPITAL_BASED_OUTPATIENT_CLINIC_OR_DEPARTMENT_OTHER): Payer: Medicare Other

## 2015-06-21 ENCOUNTER — Ambulatory Visit (HOSPITAL_BASED_OUTPATIENT_CLINIC_OR_DEPARTMENT_OTHER): Payer: Medicare Other | Admitting: Hematology

## 2015-06-21 ENCOUNTER — Telehealth: Payer: Self-pay | Admitting: Hematology

## 2015-06-21 ENCOUNTER — Encounter: Payer: Self-pay | Admitting: Hematology

## 2015-06-21 ENCOUNTER — Other Ambulatory Visit (HOSPITAL_BASED_OUTPATIENT_CLINIC_OR_DEPARTMENT_OTHER): Payer: Medicare Other

## 2015-06-21 VITALS — BP 141/68 | HR 65 | Temp 98.1°F | Resp 18 | Ht 65.0 in | Wt 141.4 lb

## 2015-06-21 VITALS — BP 140/61 | HR 58 | Temp 98.5°F | Resp 18

## 2015-06-21 DIAGNOSIS — C187 Malignant neoplasm of sigmoid colon: Secondary | ICD-10-CM | POA: Diagnosis not present

## 2015-06-21 DIAGNOSIS — D509 Iron deficiency anemia, unspecified: Secondary | ICD-10-CM

## 2015-06-21 DIAGNOSIS — I10 Essential (primary) hypertension: Secondary | ICD-10-CM

## 2015-06-21 DIAGNOSIS — E119 Type 2 diabetes mellitus without complications: Secondary | ICD-10-CM

## 2015-06-21 DIAGNOSIS — C787 Secondary malignant neoplasm of liver and intrahepatic bile duct: Secondary | ICD-10-CM | POA: Insufficient documentation

## 2015-06-21 DIAGNOSIS — C189 Malignant neoplasm of colon, unspecified: Secondary | ICD-10-CM | POA: Insufficient documentation

## 2015-06-21 DIAGNOSIS — C7800 Secondary malignant neoplasm of unspecified lung: Secondary | ICD-10-CM | POA: Diagnosis not present

## 2015-06-21 DIAGNOSIS — C7972 Secondary malignant neoplasm of left adrenal gland: Secondary | ICD-10-CM

## 2015-06-21 LAB — CBC WITH DIFFERENTIAL/PLATELET
BASO%: 0.7 % (ref 0.0–2.0)
Basophils Absolute: 0 10*3/uL (ref 0.0–0.1)
EOS%: 2.2 % (ref 0.0–7.0)
Eosinophils Absolute: 0.1 10*3/uL (ref 0.0–0.5)
HEMATOCRIT: 30.7 % — AB (ref 38.4–49.9)
HEMOGLOBIN: 9.9 g/dL — AB (ref 13.0–17.1)
LYMPH#: 0.7 10*3/uL — AB (ref 0.9–3.3)
LYMPH%: 15.4 % (ref 14.0–49.0)
MCH: 28.8 pg (ref 27.2–33.4)
MCHC: 32.2 g/dL (ref 32.0–36.0)
MCV: 89.2 fL (ref 79.3–98.0)
MONO#: 0.5 10*3/uL (ref 0.1–0.9)
MONO%: 10.7 % (ref 0.0–14.0)
NEUT#: 3.2 10*3/uL (ref 1.5–6.5)
NEUT%: 71 % (ref 39.0–75.0)
NRBC: 0 % (ref 0–0)
Platelets: 108 10*3/uL — ABNORMAL LOW (ref 140–400)
RBC: 3.44 10*6/uL — ABNORMAL LOW (ref 4.20–5.82)
RDW: 19 % — ABNORMAL HIGH (ref 11.0–14.6)
WBC: 4.6 10*3/uL (ref 4.0–10.3)

## 2015-06-21 LAB — COMPREHENSIVE METABOLIC PANEL (CC13)
ALBUMIN: 3.1 g/dL — AB (ref 3.5–5.0)
ALK PHOS: 248 U/L — AB (ref 40–150)
ALT: 43 U/L (ref 0–55)
AST: 55 U/L — AB (ref 5–34)
Anion Gap: 7 mEq/L (ref 3–11)
BILIRUBIN TOTAL: 0.56 mg/dL (ref 0.20–1.20)
BUN: 12 mg/dL (ref 7.0–26.0)
CALCIUM: 8.8 mg/dL (ref 8.4–10.4)
CO2: 24 mEq/L (ref 22–29)
CREATININE: 0.9 mg/dL (ref 0.7–1.3)
Chloride: 107 mEq/L (ref 98–109)
EGFR: >90 mL/min/{1.73_m2} (ref >90)
Glucose: 134 mg/dl (ref 70–140)
Potassium: 4.3 mEq/L (ref 3.5–5.1)
Sodium: 138 mEq/L (ref 136–145)
TOTAL PROTEIN: 7.6 g/dL (ref 6.4–8.3)

## 2015-06-21 MED ORDER — SODIUM CHLORIDE 0.9 % IJ SOLN
10.0000 mL | INTRAMUSCULAR | Status: DC | PRN
  Administered 2015-06-21: 10 mL
  Filled 2015-06-21: qty 10

## 2015-06-21 MED ORDER — SODIUM CHLORIDE 0.9 % IV SOLN
Freq: Once | INTRAVENOUS | Status: AC
  Administered 2015-06-21: 09:00:00 via INTRAVENOUS

## 2015-06-21 MED ORDER — HEPARIN SOD (PORK) LOCK FLUSH 100 UNIT/ML IV SOLN
500.0000 [IU] | Freq: Once | INTRAVENOUS | Status: AC | PRN
  Administered 2015-06-21: 500 [IU]
  Filled 2015-06-21: qty 5

## 2015-06-21 MED ORDER — SODIUM CHLORIDE 0.9 % IV SOLN
510.0000 mg | Freq: Once | INTRAVENOUS | Status: AC
  Administered 2015-06-21: 510 mg via INTRAVENOUS
  Filled 2015-06-21: qty 17

## 2015-06-21 NOTE — Progress Notes (Signed)
Oncology Nurse Navigator Documentation  Oncology Nurse Navigator Flowsheets 06/21/2015  Navigator Encounter Type 3 month  Patient Visit Type Medonc  Treatment Phase Treatment on hold due to GI bleed  Barriers/Navigation Needs No barriers at this time  Time Spent with Patient 15  Printed instructions from visit today for patient and reviewed with him and his daughter. Encouraged to bring all his medications to every MD visit.

## 2015-06-21 NOTE — Telephone Encounter (Signed)
Gave adn printed appt sched and avs for pt for Aug and Sept °

## 2015-06-21 NOTE — Progress Notes (Signed)
Pt requested to leave after twenty minutes of observation. Vital signs stable.

## 2015-06-21 NOTE — Patient Instructions (Signed)

## 2015-06-21 NOTE — Progress Notes (Signed)
[place]  [phone] [phone]  Clinic New Consult Note   Patient Care Team: [name], MD as PCP - General (Specialist) [name], CRNP as Nurse Practitioner (Nurse Practitioner) Truitt Merle, MD as Consulting Physician (Hematology) Ladene Artist, MD as Consulting Physician (Gastroenterology) 06/21/2015  CHIEF COMPLAINTS Metastatic sigmoid colon cancer  Oncology History   He is a Metastatic colon cancer to liver   Staging form: Colon and Rectum, AJCC 7th Edition     Clinical: Stage Unknown ([place], NX, M1) - Unsigned      Metastatic colon cancer to liver   10/28/2014 Tumor Marker AFP 3.2 CEA > 10,000 CA 19-9 12,929.6. tumor (-) KRAS and NRAS mutation.    11/10/2014 Imaging PET scan showed hypermetabolic mass in the sigmoid colon was noted metastasis in the retroperitoneum. Probable left adrenal and pulmonary metastasis, and diffuse liver metastasis.   11/21/2014 Initial Diagnosis Metastatic colon cancer to liver, lung, abd nodes and left adrenal gland. Diagnosis was made by liver biopsy.    11/30/2014 -  Chemotherapy First line chemo [id number], Panitumumab added from second cycle    12/28/2014 - 12/31/2014 Hospital Admission Was admitted for dehydration, neutropenia fever with UTI, and severe skin rash.   02/08/2015 - 02/12/2015 Hospital Admission He was admitted for upper GI bleeding, e.g. showed a gastric ulcer with clots, status post appendectomy injection. He also received a blood transfusion.   03/01/2015 Tumor Marker CEA 694, CA19.9 462   03/13/2015 Imaging CT CAP showed partial response, no new lesions.    03/15/2015 - 05/23/2015 Chemotherapy restart FOLFOX, held again on 8/9 due to GI bleeding    05/31/2015 - 06/02/2015 Hospital Admission He was admitted to [facility] in [place] due to upper GI bleeding, EGD showed gastric and dudenal ulcers      CURRENT THERAPY: supportive care   HISTORY OF PRESENTING ILLNESS:  Kalon Larch 69 y.o. male is  here because of abnormal CT findings, which is very suspicious for malignancy. He is on ranitidine from [place], has been on in the [place] for 16 years. He came in with his son and an interpreter.  He has been feeling fatigued since two month ago. He is still able to do all ADLs. He otherwise denies any pain, bloating or nausea.  He lost about 20lbs in 3 month. His appetite is lower than before, eats less, no change of his bowl habits.  She denied any hematochezia or melana. Per his son, he has had some personality changes daily, irritable, slightly confused some time.  He was evaluated by his primary care physician. Lab test reviewed hepatitis B infection, which he did not know before, and elevated alkaline phosphatase, his liver function and the rest of the liver function was not remarkable. [place] of abdomen was obtained on 07/22/2014, which showed diffusely abnormal liver with multiple echogenic lesions. CT of abdomen with and without contrast was done on 08/26/2014, which reviewed here at a medically with multiple large partially calcified hepatic masses consistent with metastatic disease. Mild retroperitoneal adenopathy with the largest node measuring 1.6 cm. And nonspecific 1.4 cm left adrenal nodule was also noticed. His tumor marker showed CEA greater than 10,000, CA 19-9 12,929, AFP 3.2 (normal). He was referred to [facility] and was evaluated by nurse practitioner [name]. Treatment for hepatitis B was not recommended based on his virus load.  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 He was evaluated by a  cardiologist in 2014. He denies any significant dyspnea on exertion. No leg swollen.  INTERIM HISTORY: Judy returns for follow-up. He is doing well overall. He denies any significant pain, nausea, or bleeding. He has good appetite and eating well. His bowel movement is normal, no melena or hematochezia. He was seen by Dr.  Welford Roche on 06/14/2015, and we'll repeat EGD in October. He has no other complaints.  MEDICAL HISTORY:  Past Medical History  Diagnosis Date  . Tachycardia   . Abnormal EKG   . Hypertension   . Non-ischemic cardiomyopathy   . Hepatitis B   . H. pylori infection   . Gastric ulcer   . Metastatic colon cancer to liver   . Diabetes mellitus without complication     metformin    SURGICAL HISTORY: Past Surgical History  Procedure Laterality Date  . Nuclear stress test  03/03/2013    High risk - consistent with nonischemic cardiomyopathy  . Left and right heart catheterization with coronary angiogram N/A 03/29/2013    Procedure: LEFT AND RIGHT HEART CATHETERIZATION WITH CORONARY ANGIOGRAM;  Surgeon: Pixie Casino, MD;  Location: [facility] CATH LAB;  Service: Cardiovascular;  Laterality: N/A;  . Esophagogastroduodenoscopy N/A 02/08/2015    Procedure: ESOPHAGOGASTRODUODENOSCOPY (EGD);  Surgeon: Ladene Artist, MD;  Location: [facility] ENDOSCOPY;  Service: Endoscopy;  Laterality: N/A;    SOCIAL HISTORY: Social History   Social History  . Marital Status: Married    Spouse Name: N/A  . Number of Children: N/A  . Years of Education: N/A   Occupational History  . Not on file.   Social History Main Topics  . Smoking status: Former Research scientist (life sciences)  . Smokeless tobacco: Never Used  . Alcohol Use: Yes     Comment: occasional  . Drug Use: No  . Sexual Activity: No   Other Topics Concern  . Not on file   Social History Narrative    FAMILY HISTORY: No family history of liver disease or malignancy.  ALLERGIES:  has No Known Allergies.  MEDICATIONS:  Current Outpatient Prescriptions on File Prior to Visit  Medication Sig Dispense Refill  . clindamycin (CLINDAGEL) 1 % gel Apply topically 2 (two) times daily. 30 g 2  . hydrocortisone 2.5 % cream Apply topically 2 (two) times daily. Once daily on face and twice daily on body 30 g 2  . Iron Polysacch Cmplx-B12-FA (FERREX 150 FORTE PO) Take 1 tablet by  mouth 2 (two) times daily.    Marland Kitchen lidocaine-prilocaine (EMLA) cream Apply 1 application topically as needed. Apply to [facility] cath at least one hour before needle stick. 30 g 2  . linagliptin (TRADJENTA) 5 MG TABS tablet Take 1 tablet (5 mg total) by mouth daily. 30 tablet 1  . lisinopril-hydrochlorothiazide (PRINZIDE,ZESTORETIC) 10-12.5 MG per tablet Take 1 tablet by mouth daily.  3  . loperamide (IMODIUM A-D) 2 MG tablet Take 2 mg by mouth 4 (four) times daily as needed for diarrhea or loose stools.    . metFORMIN (GLUCOPHAGE) 500 MG tablet Take by mouth 2 (two) times daily with a meal.    . mirtazapine (REMERON) 15 MG tablet Take 1 tablet (15 mg total) by mouth at bedtime. 30 tablet 2  . nadolol (CORGARD) 20 MG tablet Take 1 tablet (20 mg total) by mouth 2 (two) times daily. 60 tablet 0  . ondansetron (ZOFRAN) 8 MG tablet Take 1 tablet (8 mg total) by mouth every 8 (eight) hours as needed for nausea or vomiting. 45 tablet 0  .  pantoprazole (PROTONIX) 40 MG tablet Take 1 tablet (40 mg total) by mouth daily. 30 tablet 11  . prochlorperazine (COMPAZINE) 10 MG tablet Take 1 tablet (10 mg total) by mouth every 6 (six) hours as needed for nausea or vomiting. 30 tablet 1   No current facility-administered medications on file prior to visit.  ;   REVIEW OF SYSTEMS:   Constitutional: Denies fevers, chills or abnormal night sweats, (+) fatigue  Eyes: Denies blurriness of vision, double vision or watery eyes Ears, nose, mouth, throat, and face: Denies mucositis or sore throat Respiratory: Denies cough, dyspnea or wheezes Cardiovascular: Denies palpitation, chest discomfort or lower extremity swelling Gastrointestinal: Denies nausea, heartburn or change in bowel habits Skin: Denies abnormal skin rashes Lymphatics: Denies new lymphadenopathy or easy bruising Neurological:Denies numbness, tingling or new weaknesses Behavioral/Psych: Mood is stable, no new changes, (+) insomnia All other systems were  reviewed with the patient and are negative.  PHYSICAL EXAMINATION: BP 141/68 mmHg  Pulse 65  Temp(Src) 98.1 F (36.7 C) (Oral)  Resp 18  Ht 5' 5" (1.651 m)  Wt 141 lb 6.4 oz (64.139 kg)  BMI 23.53 kg/m2  SpO2 100%  ECOG PERFORMANCE STATUS: 3 Vital sign were taken at in the infusion room, within normal limits. GENERAL:alert, no distress and comfortable SKIN: (+) Dry skin, scatter acne like skin rash es on the face, neck and trunk, better than last time, no skin ulcer or discharge. EYES: normal, conjunctiva are pink and non-injected, sclera clear OROPHARYNX:no exudate, no erythema and lips, buccal mucosa, and tongue normal  NECK: supple, thyroid normal size, non-tender, without nodularity LYMPH:  no palpable lymphadenopathy in the cervical, axillary or inguinal LUNGS: clear to auscultation and percussion with normal breathing effort, (+) crackles on b/l lung base  HEART: regular rate & rhythm and no murmurs and no lower extremity edema ABDOMEN:abdomen soft, non-tender, no hepatomegaly, no splenomegaly and normal bowel sounds Musculoskeletal:no cyanosis of digits and no clubbing  PSYCH: alert & oriented x 3 with fluent speech NEURO: no focal motor/sensory deficits  LABORATORY DATA:  I have reviewed the data as listed CBC Latest Ref Rng 06/21/2015 06/13/2015 06/06/2015  WBC 4.0 - 10.3 10e3/uL 4.6 7.5 11.9(H)  Hemoglobin 13.0 - 17.1 g/dL 9.9(L) 9.6(L) 8.2(L)  Hematocrit 38.4 - 49.9 % 30.7(L) 29.6(L) 25.3(L)  Platelets 140 - 400 10e3/uL 108(L) 180 114(L)    CMP Latest Ref Rng 06/21/2015 06/13/2015 06/06/2015  Glucose 70 - 140 mg/dl 134 129 150(H)  BUN 7.0 - 26.0 mg/dL 12.0 7.4 10.0  Creatinine 0.7 - 1.3 mg/dL 0.9 0.8 1.0  Sodium 136 - 145 mEq/L 138 137 138  Potassium 3.5 - 5.1 mEq/L 4.3 3.9 3.9  Chloride 96 - 112 mmol/L - - -  CO2 22 - 29 mEq/L _0 Calcium 8.4 - 10.4 mg/dL 8.8 8.8 8.1(L)  Total Protein 6.4 - 8.3 g/dL 7.6 7.4 6.9  Total Bilirubin 0.20 - 1.20 mg/dL 0.56 0.53 0.55    Alkaline Phos 40 - 150 U/L 248(H) 261(H) 293(H)  AST 5 - 34 U/L 55(H) 71(H) 43(H)  ALT 0 - 55 U/L 43 39 30     INITIAL tumor markers AFP 3.2 CEA > 10,000 CA 19-9 12,929.6  CEA  Status: Finalresult Visible to patient:  Not Released Nextappt: 06/28/2015 at 08:00 AM in Oncology ([facility] ONLY) Dx:  Metastatic colon cancer to liver              Ref Range 2wk ago  52moago  32moago     CEA 0.0 - 5.0 ng/mL 90.7 (H) 249.5 (H)CM 556.6 (H)CM           Pathology report  Liver, needle/core biopsy - METASTATIC ADENOCARCINOMA, SEE COMMENT. Microscopic Comment The adenocarcinoma demonstrates the following immunophenotype: Cytokeratin 7 - negative expression. Cytokeratin 20 - strong diffuse expression. CD2 - strong diffuse expression. Overall the morphology and immunophenotype are that of metastatic adenocarcinoma primary to colorectum. The recent nuclear medicine scan demonstrating sigmoid mass with associated liver masses is noted.      RADIOGRAPHIC STUDIES: I have personally reviewed the outside CT scan image with patient and his son.   CT chest, abdomen and pelvis with IV contrast on 05/19/2015. IMPRESSION: Mild residual wall thickening at the site of known sigmoid colonic adenocarcinoma.  Improving nodal metastases, as described above, including a dominant 11 mm left para-aortic node.  Improving multifocal hepatic metastases, as described above.  Small bilateral adrenal metastases.  Near complete resolution of prior calcified pulmonary metastases.  [facility] [place] was found in the low cervical esophagus, they will 5 mm in largest diameter.  Mild portal hypertension gets dropsy was found in the gastric fundus One oozing cratered gastric ulcer was seen at the incisura and gastric antrum, [id number] nonbleeding superficial duodenal ulcers with no stigmata of bleeding was found in the duodenal  bowel  ASSESSMENT & PLAN:  [id number] old [place], with past history of hypertension and dilated nonischemic gammopathy with EF 25%, no clinical signs of heart failure, who was found to have hepatitis B infection lately, and multiple liver lesions on the CT scan. He has extremely high CEA and CA 19-9 levels. PET scan reviewed a hypermetabolic sigmoid colon mass, diffuse liver metastasis, probable lung and adrenal gland metastasis.  1. Metastatic sigmoid colon cancer, with diffuse liver, lungs, node and left adrenal gland metastases. KRAS/NRAS wild type, MSI-stable -Liver biopsy showed metastatic adenocarcinoma. His tumor were strongly positive for CK20 and CD2, consistent with primary colorectal primary. KRAS and NRAS mutations were not detected.  -Pt understands that this is incurable cancer, and he has very high disease burden and overall prognosis is poor. The treatment goal is palliative -His bilirubinemia has improved after first cycle chemotherapy.  -Giving the wild-type KRAS/NRAS genotype, he would benefit from EGFR targeted therapy. He started panitumumab from second cycle chemo. He had G2-3 skin rash with neutropenic fever, and was admitted for GI bleeding after second dose panitumumab. Will hold on it for now. May consider cetuximab with second line chemo. -Avastin contraindicated due to his gastric ulcer and bleeding in April 2016 and Aug 2016  -I discussed his restaging CT scan from 7/22, which showed continue response. No new lesions. He is doing well and his tumor marker has come down dramatically, supporting good clinical response.  -Due to the second episode of upper GI bleeding and ulcers, I'll hold his chemotherapy, until his ulcers are healed  -Due to the possible relationship of chemotherapy and upper GI ulcer in the bleeding, I probably will change his chemotherapy treatment to [place] panitumumab -I will send his tumor for [facility] test to see if he has additional  mutations such as HER2 and BRAF  2. Gastric ulcer with significant GI bleeding in April and Aug 2016 -He is on PPI, continue twice daily  -He will follow-up with Dr. StFuller Plannd repeated EGD in Oct  -continue Nadolol 2074mid, per Dr. StaFuller Plan. Newly diagnosed diabetes -HbA1c was  8.5, blood glucose not well controlled  -We again discussed diet and then close monitoring sugar at home. -Continue medication, follow up with primary care physician.  4. HTN, Dilated nonischemic ischemia cardiomyopathy with EF 25% -He is clinically doing well without symptoms of CHF. However this is probably going to impact his chemotherapy. I'll try to avoid cardiotoxic chemotherapy agent and avoid fluid overload during chemotherapy. -Continue follow-up with cardiology.  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 Follow-up with liver clinic.  7. Malnutrition -I encouraged him to eat more, and take supplements as needed. -follow up with Dietitian   8. Worsening Anemia secondary to GI bleeding and iron deficiency  -Repeat lab on 06/06/2015 showed ferritin 117, serum iron 24, saturation 8%, which supports iron deficiency -He received IV Feraheme last week, and the second dose today  Plan -hold on chemo for now -second dose Feraheme today -CBC weekly and I will see him back in 2 weeks  -[facility], I spoke with path today   All questions were answered. The patient knows to call the clinic with any problems, questions or concerns.  I spent 30 minutes counseling the patient face to face. The total time spent in the appointment was 40 minutes and more than 50% was on counseling.     Truitt Merle, MD 06/21/2015   9:07 AM

## 2015-06-21 NOTE — Patient Instructions (Signed)
Continue your stomach medications -get refills from Dr. Fuller Plan Avoid spicy foods Plan on chemo to start again (treatment change) in mid September Weekly labs to check blood count *REMEMBER TO BRING ALL MEDICATIONS TO EACH APPOINTMENT*

## 2015-06-23 ENCOUNTER — Ambulatory Visit: Payer: Medicare Other

## 2015-06-27 ENCOUNTER — Encounter (HOSPITAL_COMMUNITY): Payer: Self-pay | Admitting: Emergency Medicine

## 2015-06-27 ENCOUNTER — Emergency Department (HOSPITAL_COMMUNITY): Payer: Medicare Other

## 2015-06-27 ENCOUNTER — Inpatient Hospital Stay (HOSPITAL_COMMUNITY)
Admission: EM | Admit: 2015-06-27 | Discharge: 2015-06-29 | DRG: 392 | Disposition: A | Discharge status: Home / Self Care | Payer: Medicare Other | Attending: Internal Medicine | Admitting: Internal Medicine

## 2015-06-27 DIAGNOSIS — K529 Noninfective gastroenteritis and colitis, unspecified: Secondary | ICD-10-CM | POA: Diagnosis present

## 2015-06-27 DIAGNOSIS — E119 Type 2 diabetes mellitus without complications: Secondary | ICD-10-CM | POA: Diagnosis present

## 2015-06-27 DIAGNOSIS — Z87891 Personal history of nicotine dependence: Secondary | ICD-10-CM | POA: Diagnosis not present

## 2015-06-27 DIAGNOSIS — I10 Essential (primary) hypertension: Secondary | ICD-10-CM | POA: Diagnosis present

## 2015-06-27 DIAGNOSIS — I5042 Chronic combined systolic (congestive) and diastolic (congestive) heart failure: Secondary | ICD-10-CM | POA: Diagnosis present

## 2015-06-27 DIAGNOSIS — I429 Cardiomyopathy, unspecified: Secondary | ICD-10-CM | POA: Diagnosis present

## 2015-06-27 DIAGNOSIS — Z79899 Other long term (current) drug therapy: Secondary | ICD-10-CM

## 2015-06-27 DIAGNOSIS — C187 Malignant neoplasm of sigmoid colon: Secondary | ICD-10-CM | POA: Diagnosis present

## 2015-06-27 DIAGNOSIS — K766 Portal hypertension: Secondary | ICD-10-CM | POA: Diagnosis present

## 2015-06-27 DIAGNOSIS — B1910 Unspecified viral hepatitis B without hepatic coma: Secondary | ICD-10-CM | POA: Diagnosis present

## 2015-06-27 DIAGNOSIS — C787 Secondary malignant neoplasm of liver and intrahepatic bile duct: Secondary | ICD-10-CM

## 2015-06-27 DIAGNOSIS — C189 Malignant neoplasm of colon, unspecified: Secondary | ICD-10-CM | POA: Diagnosis present

## 2015-06-27 DIAGNOSIS — D649 Anemia, unspecified: Secondary | ICD-10-CM | POA: Diagnosis present

## 2015-06-27 LAB — CBC
HCT: 36.8 % — ABNORMAL LOW (ref 39.0–52.0)
HEMOGLOBIN: 12.1 g/dL — AB (ref 13.0–17.0)
MCH: 30.1 pg (ref 26.0–34.0)
MCHC: 32.9 g/dL (ref 30.0–36.0)
MCV: 91.5 fL (ref 78.0–100.0)
Platelets: 143 10*3/uL — ABNORMAL LOW (ref 150–400)
RBC: 4.02 MIL/uL — AB (ref 4.22–5.81)
RDW: 19.1 % — ABNORMAL HIGH (ref 11.5–15.5)
WBC: 13.6 10*3/uL — ABNORMAL HIGH (ref 4.0–10.5)

## 2015-06-27 LAB — COMPREHENSIVE METABOLIC PANEL
ALBUMIN: 3.8 g/dL (ref 3.5–5.0)
ALK PHOS: 303 U/L — AB (ref 38–126)
ALT: 46 U/L (ref 17–63)
ANION GAP: 9 (ref 5–15)
AST: 64 U/L — ABNORMAL HIGH (ref 15–41)
BUN: 13 mg/dL (ref 6–20)
CHLORIDE: 103 mmol/L (ref 101–111)
CO2: 28 mmol/L (ref 22–32)
CREATININE: 0.88 mg/dL (ref 0.61–1.24)
Calcium: 10.5 mg/dL — ABNORMAL HIGH (ref 8.9–10.3)
GFR calc non Af Amer: >60 mL/min (ref >60)
GLUCOSE: 213 mg/dL — AB (ref 65–99)
Potassium: 4.2 mmol/L (ref 3.5–5.1)
SODIUM: 140 mmol/L (ref 135–145)
Total Bilirubin: 1.2 mg/dL (ref 0.3–1.2)
Total Protein: 9.2 g/dL — ABNORMAL HIGH (ref 6.5–8.1)

## 2015-06-27 LAB — PROTIME-INR
INR: 1.08 (ref 0.00–1.49)
Prothrombin Time: 14.2 seconds (ref 11.6–15.2)

## 2015-06-27 LAB — LIPASE, BLOOD: LIPASE: 23 U/L (ref 22–51)

## 2015-06-27 LAB — POC OCCULT BLOOD, ED: Fecal Occult Bld: POSITIVE — AB

## 2015-06-27 MED ORDER — CLINDAMYCIN PHOSPHATE 1 % EX SOLN
Freq: Two times a day (BID) | CUTANEOUS | Status: DC
  Filled 2015-06-27: qty 30

## 2015-06-27 MED ORDER — ACETAMINOPHEN 650 MG RE SUPP: 650.0000 mg | Freq: Four times a day (QID) | RECTAL | Status: DC | PRN

## 2015-06-27 MED ORDER — OXYCODONE HCL 5 MG PO TABS: 5.0000 mg | ORAL_TABLET | ORAL | Status: DC | PRN

## 2015-06-27 MED ORDER — METRONIDAZOLE IN NACL 5-0.79 MG/ML-% IV SOLN
500.0000 mg | Freq: Three times a day (TID) | INTRAVENOUS | Status: DC
  Administered 2015-06-28 – 2015-06-29 (×5): 500 mg via INTRAVENOUS
  Filled 2015-06-27 (×5): qty 100

## 2015-06-27 MED ORDER — ONDANSETRON HCL 4 MG PO TABS: 4.0000 mg | ORAL_TABLET | Freq: Four times a day (QID) | ORAL | Status: DC | PRN

## 2015-06-27 MED ORDER — PANTOPRAZOLE SODIUM 40 MG PO TBEC
40.0000 mg | DELAYED_RELEASE_TABLET | Freq: Two times a day (BID) | ORAL | Status: DC
  Administered 2015-06-28 – 2015-06-29 (×4): 40 mg via ORAL
  Filled 2015-06-27 (×4): qty 1

## 2015-06-27 MED ORDER — MIRTAZAPINE 15 MG PO TABS
15.0000 mg | ORAL_TABLET | Freq: Every day | ORAL | Status: DC
  Administered 2015-06-28 (×2): 15 mg via ORAL
  Filled 2015-06-27 (×2): qty 1

## 2015-06-27 MED ORDER — LIDOCAINE-PRILOCAINE 2.5-2.5 % EX CREA
1.0000 | TOPICAL_CREAM | CUTANEOUS | Status: DC | PRN
Start: 2015-06-27 — End: 2015-06-29

## 2015-06-27 MED ORDER — IOHEXOL 300 MG/ML  SOLN
100.0000 mL | Freq: Once | INTRAMUSCULAR | Status: AC | PRN
  Administered 2015-06-27: 100 mL via INTRAVENOUS

## 2015-06-27 MED ORDER — IOHEXOL 300 MG/ML  SOLN
25.0000 mL | Freq: Once | INTRAMUSCULAR | Status: AC | PRN
  Administered 2015-06-27: 25 mL via ORAL

## 2015-06-27 MED ORDER — HYDROCORTISONE 1 % EX CREA
TOPICAL_CREAM | Freq: Two times a day (BID) | CUTANEOUS | Status: DC
  Filled 2015-06-27: qty 28

## 2015-06-27 MED ORDER — HYDROMORPHONE HCL 1 MG/ML IJ SOLN: 0.5000 mg | INTRAMUSCULAR | Status: DC | PRN

## 2015-06-27 MED ORDER — CIPROFLOXACIN IN D5W 400 MG/200ML IV SOLN
400.0000 mg | Freq: Two times a day (BID) | INTRAVENOUS | Status: DC
  Administered 2015-06-28 – 2015-06-29 (×3): 400 mg via INTRAVENOUS
  Filled 2015-06-27 (×3): qty 200

## 2015-06-27 MED ORDER — IRON POLYSACCH CMPLX-B12-FA 150-0.025-1 MG PO CAPS: ORAL_CAPSULE | Freq: Two times a day (BID) | ORAL | Status: DC

## 2015-06-27 MED ORDER — CIPROFLOXACIN IN D5W 400 MG/200ML IV SOLN
400.0000 mg | Freq: Once | INTRAVENOUS | Status: DC
  Administered 2015-06-27: 400 mg via INTRAVENOUS
  Filled 2015-06-27: qty 200

## 2015-06-27 MED ORDER — ACETAMINOPHEN 325 MG PO TABS: 650.0000 mg | ORAL_TABLET | Freq: Four times a day (QID) | ORAL | Status: DC | PRN

## 2015-06-27 MED ORDER — ADULT MULTIVITAMIN W/MINERALS CH
1.0000 | ORAL_TABLET | Freq: Every day | ORAL | Status: DC
  Administered 2015-06-28 – 2015-06-29 (×2): 1 via ORAL
  Filled 2015-06-27 (×2): qty 1

## 2015-06-27 MED ORDER — LINAGLIPTIN 5 MG PO TABS
5.0000 mg | ORAL_TABLET | Freq: Every day | ORAL | Status: DC
  Administered 2015-06-28: 5 mg via ORAL
  Filled 2015-06-27: qty 1

## 2015-06-27 MED ORDER — METRONIDAZOLE IN NACL 5-0.79 MG/ML-% IV SOLN: 500.0000 mg | Freq: Once | INTRAVENOUS | Status: DC

## 2015-06-27 MED ORDER — NADOLOL 20 MG PO TABS
20.0000 mg | ORAL_TABLET | Freq: Two times a day (BID) | ORAL | Status: DC
  Administered 2015-06-28 – 2015-06-29 (×4): 20 mg via ORAL
  Filled 2015-06-27 (×5): qty 1

## 2015-06-27 MED ORDER — METFORMIN HCL 500 MG PO TABS: 500.0000 mg | ORAL_TABLET | Freq: Two times a day (BID) | ORAL | Status: DC

## 2015-06-27 MED ORDER — ONDANSETRON HCL 4 MG/2ML IJ SOLN
4.0000 mg | Freq: Four times a day (QID) | INTRAMUSCULAR | Status: DC | PRN
  Filled 2015-06-27: qty 2

## 2015-06-27 MED ORDER — FOLIC ACID 1 MG PO TABS
1.0000 mg | ORAL_TABLET | Freq: Every day | ORAL | Status: DC
  Administered 2015-06-28: 1 mg via ORAL
  Filled 2015-06-27 (×2): qty 1

## 2015-06-27 MED ORDER — VITAMIN B-1 100 MG PO TABS
100.0000 mg | ORAL_TABLET | Freq: Every day | ORAL | Status: DC
  Administered 2015-06-28 – 2015-06-29 (×2): 100 mg via ORAL
  Filled 2015-06-27 (×2): qty 1

## 2015-06-27 MED ORDER — LISINOPRIL-HYDROCHLOROTHIAZIDE 10-12.5 MG PO TABS: 1.0000 | ORAL_TABLET | Freq: Every day | ORAL | Status: DC

## 2015-06-27 NOTE — ED Notes (Signed)
Pt has had approximately 5-6 dark, watery BM since being in room.

## 2015-06-27 NOTE — H&P (Signed)
[facility] History and Physical  [name] DUK:[id number] DOB: 1946/09/12 DOA: 06/27/2015  Referring physician: Etta Quill NP PCP: [name], MD   Chief Complaint: Abdominal Pain  HPI: Jason Reilly is a 69 y.o. 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 He states that the pain started this morning. He states the pain is located around the umbilical area and does not appear to radiate. He has had diarrhea also. He denies having any blood in the stools. He states too numerous to count as far as bowel movements. Patient states with the diarrhea there was no relief of the pain. Now in the ED his pain is better. He has had no fever or chills noted. No nausea or vomiting noted. Patient has no chest pain noted. He denies having any dizziness no syncope. He does feel weak however. He has a previous history of ulcers but at that time he had no pain.He is followed by Gi and last seen on 8/17. He has been diagnosed with colon cancer in January of this year and underwent chemotherapy liver biopsy showed mets. CT scan done today shows stability of the previous finding with new findings of pancolitis.   Review of Systems:  12 point ROS performed and is unremarkable other than HPI.   Past Medical History  Diagnosis Date  . Tachycardia   . Abnormal EKG   . Hypertension   . Non-ischemic cardiomyopathy   . Hepatitis B   . H. pylori infection   . Gastric ulcer   . Metastatic colon cancer to liver   . Diabetes mellitus without complication     metformin   Past Surgical History  Procedure Laterality Date  . Nuclear stress test  03/03/2013    High risk - consistent with nonischemic cardiomyopathy  . Left and right heart catheterization with coronary angiogram N/A 03/29/2013    Procedure: LEFT AND RIGHT HEART CATHETERIZATION WITH CORONARY ANGIOGRAM;  Surgeon: Pixie Casino, MD;  Location: [facility] CATH LAB;   Service: Cardiovascular;  Laterality: N/A;  . Esophagogastroduodenoscopy N/A 02/08/2015    Procedure: ESOPHAGOGASTRODUODENOSCOPY (EGD);  Surgeon: Ladene Artist, MD;  Location: [facility] ENDOSCOPY;  Service: Endoscopy;  Laterality: N/A;   Social History:  reports that he has quit smoking. He has never used smokeless tobacco. He reports that he drinks alcohol. He reports that he does not use illicit drugs.  No Known Allergies  History reviewed. No pertinent family history.   Prior to Admission medications   Medication Sig Start Date End Date Taking? Authorizing Provider  clindamycin (CLINDAGEL) 1 % gel Apply topically 2 (two) times daily. 01/11/15  Yes Truitt Merle, MD  hydrocortisone 2.5 % cream Apply topically 2 (two) times daily. Once daily on face and twice daily on body 01/11/15  Yes Truitt Merle, MD  Iron Polysacch Cmplx-B12-FA (FERREX 150 FORTE PO) Take 1 tablet by mouth 2 (two) times daily.   Yes Historical Provider, MD  lidocaine-prilocaine (EMLA) cream Apply 1 application topically as needed. Apply to [facility] cath at least one hour before needle stick. 11/30/14  Yes Truitt Merle, MD  linagliptin (TRADJENTA) 5 MG TABS tablet Take 1 tablet (5 mg total) by mouth daily. 01/02/15  Yes Theodis Blaze, MD  lisinopril-hydrochlorothiazide (PRINZIDE,ZESTORETIC) 10-12.5 MG per tablet Take 1 tablet by mouth daily. 12/02/14  Yes Historical Provider, MD  loperamide (IMODIUM A-D) 2 MG tablet Take 2 mg by mouth 4 (four) times daily  as needed for diarrhea or loose stools.   Yes Historical Provider, MD  metFORMIN (GLUCOPHAGE) 500 MG tablet Take by mouth 2 (two) times daily with a meal.   Yes Historical Provider, MD  mirtazapine (REMERON) 15 MG tablet Take 1 tablet (15 mg total) by mouth at bedtime. 02/21/15  Yes Truitt Merle, MD  nadolol (CORGARD) 20 MG tablet Take 1 tablet (20 mg total) by mouth 2 (two) times daily. 06/06/15  Yes Truitt Merle, MD  ondansetron (ZOFRAN) 8 MG tablet Take 1 tablet (8 mg total) by mouth every 8 (eight) hours as  needed for nausea or vomiting. 12/31/14  Yes Theodis Blaze, MD  pantoprazole (PROTONIX) 40 MG tablet Take 1 tablet (40 mg total) by mouth daily. Patient taking differently: Take 40 mg by mouth 2 (two) times daily.  03/17/15  Yes Ladene Artist, MD  PRESCRIPTION MEDICATION Chemo - [place]   Yes Historical Provider, MD  prochlorperazine (COMPAZINE) 10 MG tablet Take 1 tablet (10 mg total) by mouth every 6 (six) hours as needed for nausea or vomiting. 05/09/15  Yes Owens Shark, NP   Physical Exam: Filed Vitals:   06/27/15 1742 06/27/15 2130  BP: 190/90 159/59  Pulse: 68 87  Temp: 97.9 F (36.6 C)   TempSrc: Oral   Resp: 18 18  SpO2: 98% 95%    Wt Readings from Last 3 Encounters:  06/21/15 64.139 kg (141 lb 6.4 oz)  06/14/15 62.234 kg (137 lb 3.2 oz)  06/06/15 65.182 kg (143 lb 11.2 oz)    General:  Appears calm and comfortable Eyes: PERRL, normal lids, irises & conjunctiva ENT: grossly normal hearing, lips & tongue Neck: no LAD, masses or thyromegaly Cardiovascular: RRR, no m/r/g. No LE edema Respiratory: CTA bilaterally, no w/r/r Abdomen: soft, ntnd Skin: no rash or induration seen on limited exam Musculoskeletal: grossly normal tone BUE/BLE Psychiatric: grossly normal mood and affect Neurologic: grossly non-focal.          Labs on Admission:  Basic Metabolic Panel:  Recent Labs Lab 06/21/15 0807 06/27/15 1811  NA 138 140  K 4.3 4.2  CL  --  103  CO2 24 28  GLUCOSE 134 213*  BUN 12.0 13  CREATININE 0.9 0.88  CALCIUM 8.8 10.5*   Liver Function Tests:  Recent Labs Lab 06/21/15 0807 06/27/15 1811  AST 55* 64*  ALT 43 46  ALKPHOS 248* 303*  BILITOT 0.56 1.2  PROT 7.6 9.2*  ALBUMIN 3.1* 3.8    Recent Labs Lab 06/27/15 1811  LIPASE 23   No results for input(s): AMMONIA in the last 168 hours. CBC:  Recent Labs Lab 06/21/15 0806 06/27/15 1811  WBC 4.6 13.6*  NEUTROABS 3.2  --   HGB 9.9* 12.1*  HCT 30.7* 36.8*  MCV 89.2 91.5  PLT 108* 143*    Cardiac Enzymes: No results for input(s): CKTOTAL, CKMB, CKMBINDEX, TROPONINI in the last 168 hours.  BNP (last 3 results) No results for input(s): BNP in the last 8760 hours.  ProBNP (last 3 results) No results for input(s): PROBNP in the last 8760 hours.  CBG: No results for input(s): GLUCAP in the last 168 hours.  Radiological Exams on Admission: Ct Abdomen Pelvis W Contrast  06/27/2015   CLINICAL DATA:  Known liver cancer. Patient has had intermittent abdominal pain for 2 weeks.  EXAM: CT ABDOMEN AND PELVIS WITH CONTRAST  TECHNIQUE: Multidetector CT imaging of the abdomen and pelvis was performed using the standard protocol following bolus administration of  intravenous contrast.  CONTRAST:  23mL OMNIPAQUE IOHEXOL 300 MG/ML SOLN, 175mL OMNIPAQUE IOHEXOL 300 MG/ML SOLN  COMPARISON:  May 19, 2015  FINDINGS: Multiple heterogeneous liver masses are identified unchanged compared to prior exam. The spleen is enlarged with masses present unchanged compared to prior exam. The pancreas, the kidneys are normal. There is a calcification in the right adrenal gland unchanged. Nodule is identified in the left adrenal gland unchanged There is atherosclerosis of the abdominal aorta without aneurysmal dilatation. Mild large retroperitoneal lymph nodes are identified unchanged.  There is diffuse bowel wall thickening throughout colon with surrounding inflammation. The previously noted colonic wall thickening along the sigmoid colon, site of prior colon cancer is identified. The caliber of the colon is normal. The small bowel is normal without small bowel obstruction. The appendix is normal.  Fluid-filled bladder is normal.  There is no pelvic lymphadenopathy.  IMPRESSION: Interval developed pan colitis either infectious or inflammatory. The previously noted site of colon cancer new sigmoid colon is again identified.  Hepatic, adrenal and splenic metastasis unchanged.   Electronically Signed   By: Abelardo Diesel  M.D.   On: 06/27/2015 21:40      Assessment/Plan Principal Problem:   Acute colitis Active Problems:   Metastatic colon cancer to liver   Chronic combined systolic and diastolic heart failure   Colon cancer   1. Acute colitis -will admit for IV antibiotics cipro and flagyl -C diff ordered in the ED -advance diet as tolerated -stool occult blood along with ova and parasites -will need GI consult  2. Metastatic colon cancer to liver -followed by oncology last seen 8/24 chemo on hold -CT scan results reviewed appear unchanged other than findings of colitis  3. Chronic Combined systolic and diastolic heart failure -history of non-ischemic cardiomyopathy with last EF of 25% -currently appears to be compensated -continue with beta blockers  4. Recent GI bleed -patient has history of gastric ulcers with bleeding in April along with grade 2 varices followed by gastroenterology and is planned for a follow up EGD -will continue with iron supplementation and monitor labs -will continue with protonix  5. HTN -will continue with current medications -monitor pressures  6. DM Type II uncomplicated -will continue metformin and linagliptin  7. Hepatits B with Portal HTN -followed in liver clinic -will continue with nadolol  8. Chronic anemia -will follow labs CBC shows improving Hgb   Code Status: full code (must indicate code status--if unknown or must be presumed, indicate so) DVT Prophylaxis:scd Family Communication: none (indicate person spoken with, if applicable, with phone number if by telephone) Disposition Plan: home (indicate anticipated LOS)    [facility] Pager [phone]

## 2015-06-27 NOTE — ED Provider Notes (Signed)
CSN: [id number]     Arrival date & time 06/27/15  1730 History   First MD Initiated Contact with Patient 06/27/15 1817     Chief Complaint  Patient presents with  . Abdominal Pain     (Consider location/radiation/quality/duration/timing/severity/associated sxs/prior Treatment) Patient is a 69 y.o. male presenting with abdominal pain. The history is provided by the patient.  Abdominal Pain Pain location:  Periumbilical Pain quality: cramping and fullness   Pain radiates to:  Does not radiate Pain severity:  Moderate Onset quality:  Gradual Duration:  7 days Timing:  Intermittent Progression:  Waxing and waning Chronicity:  New   Past Medical History  Diagnosis Date  . Tachycardia   . Abnormal EKG   . Hypertension   . Non-ischemic cardiomyopathy   . Hepatitis B   . H. pylori infection   . Gastric ulcer   . Metastatic colon cancer to liver   . Diabetes mellitus without complication     metformin   Past Surgical History  Procedure Laterality Date  . Nuclear stress test  03/03/2013    High risk - consistent with nonischemic cardiomyopathy  . Left and right heart catheterization with coronary angiogram N/A 03/29/2013    Procedure: LEFT AND RIGHT HEART CATHETERIZATION WITH CORONARY ANGIOGRAM;  Surgeon: Pixie Casino, MD;  Location: [facility] CATH LAB;  Service: Cardiovascular;  Laterality: N/A;  . Esophagogastroduodenoscopy N/A 02/08/2015    Procedure: ESOPHAGOGASTRODUODENOSCOPY (EGD);  Surgeon: Ladene Artist, MD;  Location: [facility];  Service: Endoscopy;  Laterality: N/A;   History reviewed. No pertinent family history. Social History  Substance Use Topics  . Smoking status: Former Research scientist (life sciences)  . Smokeless tobacco: Never Used  . Alcohol Use: Yes     Comment: occasional    Review of Systems  Gastrointestinal: Positive for abdominal pain.  All other systems reviewed and are negative.     Allergies  Review of patient's allergies indicates no known allergies.  Home  Medications   Prior to Admission medications   Medication Sig Start Date End Date Taking? Authorizing Provider  clindamycin (CLINDAGEL) 1 % gel Apply topically 2 (two) times daily. 01/11/15  Yes Truitt Merle, MD  hydrocortisone 2.5 % cream Apply topically 2 (two) times daily. Once daily on face and twice daily on body 01/11/15  Yes Truitt Merle, MD  Iron Polysacch Cmplx-B12-FA (FERREX 150 FORTE PO) Take 1 tablet by mouth 2 (two) times daily.   Yes Historical Provider, MD  lidocaine-prilocaine (EMLA) cream Apply 1 application topically as needed. Apply to [facility] cath at least one hour before needle stick. 11/30/14  Yes Truitt Merle, MD  linagliptin (TRADJENTA) 5 MG TABS tablet Take 1 tablet (5 mg total) by mouth daily. 01/02/15  Yes Theodis Blaze, MD  lisinopril-hydrochlorothiazide (PRINZIDE,ZESTORETIC) 10-12.5 MG per tablet Take 1 tablet by mouth daily. 12/02/14  Yes Historical Provider, MD  loperamide (IMODIUM A-D) 2 MG tablet Take 2 mg by mouth 4 (four) times daily as needed for diarrhea or loose stools.   Yes Historical Provider, MD  metFORMIN (GLUCOPHAGE) 500 MG tablet Take by mouth 2 (two) times daily with a meal.   Yes Historical Provider, MD  mirtazapine (REMERON) 15 MG tablet Take 1 tablet (15 mg total) by mouth at bedtime. 02/21/15  Yes Truitt Merle, MD  nadolol (CORGARD) 20 MG tablet Take 1 tablet (20 mg total) by mouth 2 (two) times daily. 06/06/15  Yes Truitt Merle, MD  ondansetron (ZOFRAN) 8 MG tablet Take 1 tablet (8 mg total) by  mouth every 8 (eight) hours as needed for nausea or vomiting. 12/31/14  Yes Theodis Blaze, MD  pantoprazole (PROTONIX) 40 MG tablet Take 1 tablet (40 mg total) by mouth daily. Patient taking differently: Take 40 mg by mouth 2 (two) times daily.  03/17/15  Yes Ladene Artist, MD  PRESCRIPTION MEDICATION Chemo - [place]   Yes Historical Provider, MD  prochlorperazine (COMPAZINE) 10 MG tablet Take 1 tablet (10 mg total) by mouth every 6 (six) hours as needed for nausea or vomiting. 05/09/15  Yes  Owens Shark, NP   BP 190/90 mmHg  Pulse 68  Temp(Src) 97.9 F (36.6 C) (Oral)  Resp 18  SpO2 98% Physical Exam  Constitutional: He is oriented to person, place, and time. He appears well-developed and well-nourished.  HENT:  Head: Normocephalic.  Eyes: Conjunctivae are normal.  Neck: Neck supple.  Cardiovascular: Normal rate and regular rhythm.   Pulmonary/Chest: Effort normal and breath sounds normal.  Abdominal: Soft. There is tenderness in the periumbilical area.    Musculoskeletal: He exhibits no edema or tenderness.  Lymphadenopathy:    He has no cervical adenopathy.  Neurological: He is alert and oriented to person, place, and time.  Skin: Skin is warm and dry.  Psychiatric: He has a normal mood and affect.  Nursing note and vitals reviewed.   ED Course  Procedures (including critical care time) Labs Review Labs Reviewed  COMPREHENSIVE METABOLIC PANEL - Abnormal; Notable for the following:    Glucose, Bld 213 (*)    Calcium 10.5 (*)    Total Protein 9.2 (*)    AST 64 (*)    Alkaline Phosphatase 303 (*)    All other components within normal limits  CBC - Abnormal; Notable for the following:    WBC 13.6 (*)    RBC 4.02 (*)    Hemoglobin 12.1 (*)    HCT 36.8 (*)    RDW 19.1 (*)    Platelets 143 (*)    All other components within normal limits  LIPASE, BLOOD    Imaging Review Ct Abdomen Pelvis W Contrast  06/27/2015   CLINICAL DATA:  Known liver cancer. Patient has had intermittent abdominal pain for 2 weeks.  EXAM: CT ABDOMEN AND PELVIS WITH CONTRAST  TECHNIQUE: Multidetector CT imaging of the abdomen and pelvis was performed using the standard protocol following bolus administration of intravenous contrast.  CONTRAST:  96mL OMNIPAQUE IOHEXOL 300 MG/ML SOLN, 161mL OMNIPAQUE IOHEXOL 300 MG/ML SOLN  COMPARISON:  May 19, 2015  FINDINGS: Multiple heterogeneous liver masses are identified unchanged compared to prior exam. The spleen is enlarged with masses present  unchanged compared to prior exam. The pancreas, the kidneys are normal. There is a calcification in the right adrenal gland unchanged. Nodule is identified in the left adrenal gland unchanged There is atherosclerosis of the abdominal aorta without aneurysmal dilatation. Mild large retroperitoneal lymph nodes are identified unchanged.  There is diffuse bowel wall thickening throughout colon with surrounding inflammation. The previously noted colonic wall thickening along the sigmoid colon, site of prior colon cancer is identified. The caliber of the colon is normal. The small bowel is normal without small bowel obstruction. The appendix is normal.  Fluid-filled bladder is normal.  There is no pelvic lymphadenopathy.  IMPRESSION: Interval developed pan colitis either infectious or inflammatory. The previously noted site of colon cancer new sigmoid colon is again identified.  Hepatic, adrenal and splenic metastasis unchanged.   Electronically Signed   By: Abelardo Diesel  M.D.   On: 06/27/2015 21:40   I have personally reviewed and evaluated these images and lab results as part of my medical decision-making.   EKG Interpretation None     Lab and radiology results reviewed, shared with patient. Leukocytosis, heme positive and CT findings of pan colitis. Will start on antibiotics and request admission. Patient seen and admitted by hospitalist. MDM   Final diagnoses:  None    Colitis.    Etta Quill, NP 06/27/15 1855  Sherwood Gambler, MD 07/01/15 [phone]

## 2015-06-27 NOTE — Progress Notes (Signed)
ANTIBIOTIC CONSULT NOTE - INITIAL  Pharmacy Consult for Cipro Indication: Intra-abdominal infection  No Known Allergies   Vital Signs: Temp: 97.9 F (36.6 C) (08/30 1742) Temp Source: Oral (08/30 1742) BP: 159/59 mmHg (08/30 2130) Pulse Rate: 87 (08/30 2130) Intake/Output from previous day:   Intake/Output from this shift:    Labs:  Recent Labs  06/27/15 1811  WBC 13.6*  HGB 12.1*  PLT 143*  CREATININE 0.88   Estimated Creatinine Clearance: 70.9 mL/min (by C-G formula based on Cr of 0.88). No results for input(s): VANCOTROUGH, VANCOPEAK, VANCORANDOM, GENTTROUGH, GENTPEAK, GENTRANDOM, TOBRATROUGH, TOBRAPEAK, TOBRARND, AMIKACINPEAK, AMIKACINTROU, AMIKACIN in the last 72 hours.   Microbiology: No results found for this or any previous visit (from the past 720 hour(s)).  Medical History: Past Medical History  Diagnosis Date  . Tachycardia   . Abnormal EKG   . Hypertension   . Non-ischemic cardiomyopathy   . Hepatitis B   . H. pylori infection   . Gastric ulcer   . Metastatic colon cancer to liver   . Diabetes mellitus without complication     metformin    Medications:   (Not in a hospital admission) Scheduled:  . clindamycin   Topical BID  . [START ON 1/79/1505] folic acid  1 mg Oral Daily  . hydrocortisone   Topical BID  . Iron Polysacch Cmplx-B12-FA   Oral BID  . [START ON 06/28/2015] linagliptin  5 mg Oral Daily  . [START ON 06/28/2015] lisinopril-hydrochlorothiazide  1 tablet Oral Daily  . [START ON 06/28/2015] metFORMIN  500 mg Oral BID WC  . mirtazapine  15 mg Oral QHS  . [START ON 06/28/2015] multivitamin with minerals  1 tablet Oral Daily  . nadolol  20 mg Oral BID  . pantoprazole  40 mg Oral BID  . [START ON 06/28/2015] thiamine  100 mg Oral Daily   Infusions:  . [START ON 06/28/2015] ciprofloxacin    . metronidazole     Assessment: 59 yoM with hx of metastatic colon ca with liver mets.  Cipro per Rx for intra-abdominal infection.   Goal of  Therapy:  Treat infection  Plan:   Cipro 400mg  IV q12h  F/u Scr/cultures as needed  Lawana Pai R 06/27/2015,11:17 PM

## 2015-06-27 NOTE — ED Notes (Addendum)
Pt's daughter states that he has had abdominal pain x 2 weeks on and off. Frequent bowel movements but denies lose BM. Alert and oriented. HTN but has not taken meds today. Liver CA.

## 2015-06-28 ENCOUNTER — Encounter (HOSPITAL_COMMUNITY): Payer: Self-pay | Admitting: *Deleted

## 2015-06-28 ENCOUNTER — Other Ambulatory Visit: Payer: Medicare Other

## 2015-06-28 DIAGNOSIS — K529 Noninfective gastroenteritis and colitis, unspecified: Principal | ICD-10-CM

## 2015-06-28 DIAGNOSIS — I5042 Chronic combined systolic (congestive) and diastolic (congestive) heart failure: Secondary | ICD-10-CM

## 2015-06-28 LAB — COMPREHENSIVE METABOLIC PANEL
ALBUMIN: 3.2 g/dL — AB (ref 3.5–5.0)
ALK PHOS: 221 U/L — AB (ref 38–126)
ALT: 38 U/L (ref 17–63)
AST: 52 U/L — AB (ref 15–41)
Anion gap: 7 (ref 5–15)
BUN: 10 mg/dL (ref 6–20)
CHLORIDE: 106 mmol/L (ref 101–111)
CO2: 27 mmol/L (ref 22–32)
CREATININE: 0.95 mg/dL (ref 0.61–1.24)
Calcium: 9.5 mg/dL (ref 8.9–10.3)
GFR calc non Af Amer: >60 mL/min (ref >60)
GLUCOSE: 113 mg/dL — AB (ref 65–99)
Potassium: 3.9 mmol/L (ref 3.5–5.1)
SODIUM: 140 mmol/L (ref 135–145)
Total Bilirubin: 0.9 mg/dL (ref 0.3–1.2)
Total Protein: 7.8 g/dL (ref 6.5–8.1)

## 2015-06-28 LAB — CBC
HCT: 34.2 % — ABNORMAL LOW (ref 39.0–52.0)
HEMOGLOBIN: 11.2 g/dL — AB (ref 13.0–17.0)
MCH: 29.8 pg (ref 26.0–34.0)
MCHC: 32.7 g/dL (ref 30.0–36.0)
MCV: 91 fL (ref 78.0–100.0)
PLATELETS: 128 10*3/uL — AB (ref 150–400)
RBC: 3.76 MIL/uL — AB (ref 4.22–5.81)
RDW: 19.3 % — ABNORMAL HIGH (ref 11.5–15.5)
WBC: 10.4 10*3/uL (ref 4.0–10.5)

## 2015-06-28 LAB — GLUCOSE, CAPILLARY
GLUCOSE-CAPILLARY: 120 mg/dL — AB (ref 65–99)
GLUCOSE-CAPILLARY: 140 mg/dL — AB (ref 65–99)
GLUCOSE-CAPILLARY: 98 mg/dL (ref 65–99)

## 2015-06-28 LAB — OVA AND PARASITE EXAMINATION: Ova and parasites: NONE SEEN

## 2015-06-28 LAB — TSH: TSH: 0.445 u[IU]/mL (ref 0.350–4.500)

## 2015-06-28 LAB — C DIFFICILE QUICK SCREEN W PCR REFLEX
C DIFFICILE (CDIFF) INTERP: NEGATIVE
C DIFFICLE (CDIFF) ANTIGEN: NEGATIVE
C Diff toxin: NEGATIVE

## 2015-06-28 MED ORDER — INFLUENZA VAC SPLIT QUAD 0.5 ML IM SUSY
0.5000 mL | PREFILLED_SYRINGE | INTRAMUSCULAR | Status: AC
  Administered 2015-06-29: 0.5 mL via INTRAMUSCULAR
  Filled 2015-06-28 (×2): qty 0.5

## 2015-06-28 MED ORDER — FOLIC ACID 1 MG PO TABS
1.0000 mg | ORAL_TABLET | Freq: Two times a day (BID) | ORAL | Status: DC
  Administered 2015-06-28 – 2015-06-29 (×4): 1 mg via ORAL
  Filled 2015-06-28 (×3): qty 1

## 2015-06-28 MED ORDER — LISINOPRIL 10 MG PO TABS: 10.0000 mg | ORAL_TABLET | Freq: Every day | ORAL | Status: DC

## 2015-06-28 MED ORDER — POLYSACCHARIDE IRON COMPLEX 150 MG PO CAPS
150.0000 mg | ORAL_CAPSULE | Freq: Two times a day (BID) | ORAL | Status: DC
  Administered 2015-06-28 – 2015-06-29 (×4): 150 mg via ORAL
  Filled 2015-06-28 (×5): qty 1

## 2015-06-28 MED ORDER — CYANOCOBALAMIN 250 MCG PO TABS
250.0000 ug | ORAL_TABLET | Freq: Two times a day (BID) | ORAL | Status: DC
  Administered 2015-06-28 – 2015-06-29 (×4): 250 ug via ORAL
  Filled 2015-06-28 (×5): qty 1

## 2015-06-28 MED ORDER — HYDROCHLOROTHIAZIDE 12.5 MG PO CAPS: 12.5000 mg | ORAL_CAPSULE | Freq: Every day | ORAL | Status: DC

## 2015-06-28 NOTE — Care Management Note (Signed)
Case Management Note  Patient Details  Name: Jason Reilly MRN: [id number] Date of Birth: 1946/04/08  Subjective/Objective:              69 yo admitted with Acute colitis      Action/Plan: From home with spouse  Expected Discharge Date:   (unknown)               Expected Discharge Plan:  Home/Self Care  In-House Referral:     Discharge planning Services  CM Consult  Post Acute Care Choice:    Choice offered to:     DME Arranged:    DME Agency:     HH Arranged:    HH Agency:     Status of Service:  In process, will continue to follow  Medicare Important Message Given:    Date Medicare IM Given:    Medicare IM give by:    Date Additional Medicare IM Given:    Additional Medicare Important Message give by:     If discussed at [place] of Stay Meetings, dates discussed:    Additional Comments: Chart reviewed and CM following for DC needs. Lynnell Catalan, RN 06/28/2015, 3:55 PM

## 2015-06-28 NOTE — Progress Notes (Signed)
[facility] PROGRESS NOTE  Jason Reilly HTD:[id number] DOB: 04/19/1946 DOA: 06/27/2015 PCP: [name], MD  Assessment/Plan: 1. Acute colitis -continue withcipro and flagyl -C diff ordered  -advance diet as tolerated -stool occult blood along with ova and parasites  2. Metastatic colon cancer to liver -followed by oncology last seen 8/24 chemo on hold -CT scan results reviewed appear unchanged other than findings of colitis -oncologist added to rounding team.  3. Chronic Combined systolic and diastolic heart failure -history of non-ischemic cardiomyopathy with last EF of 25% -currently appears to be compensated -continue with beta blockers  4. Recent GI bleed -patient has history of gastric ulcers with bleeding in April along with grade 2 varices followed by gastroenterology and is planned for a follow up EGD -will continue with iron supplementation and monitor labs -will continue with protonix  5. HTN -will continue with current medications -monitor pressures  6. DM Type II uncomplicated -hold metformin and linagliptin -SSI>   7. Hepatits B with Portal HTN -followed in liver clinic -will continue with nadolol  8. Chronic anemia -will follow labs CBC shows improving Hgb  Code Status: full code. Family Communication: care discussed with patient.  Disposition Plan: remain inpatient.    Consultants:  Oncologist   Procedures:  none  Antibiotics:  cipro and flagyl.   HPI/Subjective: Abdominal pain better.  Had diarrhea yesterday.   Objective: Filed Vitals:   06/28/15 1335  BP: 104/63  Pulse: 68  Temp: 97.3 F (36.3 C)  Resp: 18    Intake/Output Summary (Last 24 hours) at 06/28/15 1723 Last data filed at 06/28/15 1300  Gross per 24 hour  Intake   1150 ml  Output      0 ml  Net   1150 ml   There were no vitals filed for this visit.  Exam:   General:  Alert in no distress  Cardiovascular: S 1, S 2 RRR  Respiratory:  CTA  Abdomen: BS present, soft, mild tenderness  Musculoskeletal: no edema  Data Reviewed: Basic Metabolic Panel:  Recent Labs Lab 06/27/15 1811 06/28/15 0420  NA 140 140  K 4.2 3.9  CL 103 106  CO2 28 27  GLUCOSE 213* 113*  BUN 13 10  CREATININE 0.88 0.95  CALCIUM 10.5* 9.5   Liver Function Tests:  Recent Labs Lab 06/27/15 1811 06/28/15 0420  AST 64* 52*  ALT 46 38  ALKPHOS 303* 221*  BILITOT 1.2 0.9  PROT 9.2* 7.8  ALBUMIN 3.8 3.2*    Recent Labs Lab 06/27/15 1811  LIPASE 23   No results for input(s): AMMONIA in the last 168 hours. CBC:  Recent Labs Lab 06/27/15 1811 06/28/15 0420  WBC 13.6* 10.4  HGB 12.1* 11.2*  HCT 36.8* 34.2*  MCV 91.5 91.0  PLT 143* 128*   Cardiac Enzymes: No results for input(s): CKTOTAL, CKMB, CKMBINDEX, TROPONINI in the last 168 hours. BNP (last 3 results) No results for input(s): BNP in the last 8760 hours.  ProBNP (last 3 results) No results for input(s): PROBNP in the last 8760 hours.  CBG:  Recent Labs Lab 06/28/15 0823 06/28/15 1141  GLUCAP 120* 140*    Recent Results (from the past 240 hour(s))  C difficile quick scan w PCR reflex     Status: None   Collection Time: 06/27/15 11:39 PM  Result Value Ref Range Status   C Diff antigen NEGATIVE NEGATIVE Final   C Diff toxin NEGATIVE NEGATIVE Final   C Diff interpretation Negative for toxigenic C.  difficile  Final  Ova and parasite examination     Status: None   Collection Time: 06/27/15 11:39 PM  Result Value Ref Range Status   Specimen Description STOOL  Final   Special Requests NONE  Final   Ova and parasites   Final    NO OVA OR PARASITES SEEN Performed at [facility]    Report Status 06/28/2015 FINAL  Final     Studies: Ct Abdomen Pelvis W Contrast  06/27/2015   CLINICAL DATA:  Known liver cancer. Patient has had intermittent abdominal pain for 2 weeks.  EXAM: CT ABDOMEN AND PELVIS WITH CONTRAST  TECHNIQUE: Multidetector CT imaging of  the abdomen and pelvis was performed using the standard protocol following bolus administration of intravenous contrast.  CONTRAST:  66mL OMNIPAQUE IOHEXOL 300 MG/ML SOLN, 158mL OMNIPAQUE IOHEXOL 300 MG/ML SOLN  COMPARISON:  May 19, 2015  FINDINGS: Multiple heterogeneous liver masses are identified unchanged compared to prior exam. The spleen is enlarged with masses present unchanged compared to prior exam. The pancreas, the kidneys are normal. There is a calcification in the right adrenal gland unchanged. Nodule is identified in the left adrenal gland unchanged There is atherosclerosis of the abdominal aorta without aneurysmal dilatation. Mild large retroperitoneal lymph nodes are identified unchanged.  There is diffuse bowel wall thickening throughout colon with surrounding inflammation. The previously noted colonic wall thickening along the sigmoid colon, site of prior colon cancer is identified. The caliber of the colon is normal. The small bowel is normal without small bowel obstruction. The appendix is normal.  Fluid-filled bladder is normal.  There is no pelvic lymphadenopathy.  IMPRESSION: Interval developed pan colitis either infectious or inflammatory. The previously noted site of colon cancer new sigmoid colon is again identified.  Hepatic, adrenal and splenic metastasis unchanged.   Electronically Signed   By: Abelardo Diesel M.D.   On: 06/27/2015 21:40    Scheduled Meds: . ciprofloxacin  400 mg Intravenous Q12H  . clindamycin   Topical BID  . folic acid  1 mg Oral Daily  . iron polysaccharides  150 mg Oral BID   And  . vitamin B-12  250 mcg Oral BID   And  . folic acid  1 mg Oral BID  . hydrocortisone cream   Topical BID  . [START ON 06/29/2015] Influenza vac split quadrivalent PF  0.5 mL Intramuscular Tomorrow-1000  . metronidazole  500 mg Intravenous Q8H  . mirtazapine  15 mg Oral QHS  . multivitamin with minerals  1 tablet Oral Daily  . nadolol  20 mg Oral BID  . pantoprazole  40 mg  Oral BID  . thiamine  100 mg Oral Daily   Continuous Infusions:   Principal Problem:   Acute colitis Active Problems:   Metastatic colon cancer to liver   Chronic combined systolic and diastolic heart failure   Colon cancer    Time spent: 35 minutes.     Niel Hummer A  [facility] Pager [phone]. If 7PM-7AM, please contact night-coverage at [email or web address], password [facility] 06/28/2015, 5:23 PM  LOS: 1 day

## 2015-06-28 NOTE — Progress Notes (Signed)
06/28/15 0820  Paged Dr Tyrell Antonio to get order for blood sugar checks.

## 2015-06-28 NOTE — Progress Notes (Signed)
Results for ARMEL, RABBANI (MRN [id number]) as of 06/28/2015 11:57  Ref. Range 06/28/2015 08:23 06/28/2015 11:41  Glucose-Capillary Latest Ref Range: 65-99 mg/dL 120 (H) 140 (H)  Results for JOBANY, MONTELLANO (MRN [id number]) as of 06/28/2015 11:57  Ref. Range 06/27/2015 18:11 06/28/2015 04:20  Glucose Latest Ref Range: 65-99 mg/dL 213 (H) 113 (H)   Consider addition of Novolog sensitive tidwc and hs. Will follow. Thank you. Lorenda Peck, RD, LDN, CDE Inpatient Diabetes Coordinator [phone]

## 2015-06-29 DIAGNOSIS — K529 Noninfective gastroenteritis and colitis, unspecified: Secondary | ICD-10-CM | POA: Diagnosis not present

## 2015-06-29 LAB — BASIC METABOLIC PANEL
ANION GAP: 5 (ref 5–15)
BUN: 14 mg/dL (ref 6–20)
CHLORIDE: 108 mmol/L (ref 101–111)
CO2: 26 mmol/L (ref 22–32)
Calcium: 8.2 mg/dL — ABNORMAL LOW (ref 8.9–10.3)
Creatinine, Ser: 0.93 mg/dL (ref 0.61–1.24)
GFR calc Af Amer: >60 mL/min (ref >60)
GLUCOSE: 96 mg/dL (ref 65–99)
POTASSIUM: 3.6 mmol/L (ref 3.5–5.1)
Sodium: 139 mmol/L (ref 135–145)

## 2015-06-29 LAB — CBC
HEMATOCRIT: 29.2 % — AB (ref 39.0–52.0)
Hemoglobin: 9.7 g/dL — ABNORMAL LOW (ref 13.0–17.0)
MCH: 30.4 pg (ref 26.0–34.0)
MCHC: 33.2 g/dL (ref 30.0–36.0)
MCV: 91.5 fL (ref 78.0–100.0)
PLATELETS: 88 10*3/uL — AB (ref 150–400)
RBC: 3.19 MIL/uL — AB (ref 4.22–5.81)
RDW: 19.1 % — ABNORMAL HIGH (ref 11.5–15.5)
WBC: 3.8 10*3/uL — AB (ref 4.0–10.5)

## 2015-06-29 LAB — GLUCOSE, CAPILLARY
GLUCOSE-CAPILLARY: 116 mg/dL — AB (ref 65–99)
Glucose-Capillary: 129 mg/dL — ABNORMAL HIGH (ref 65–99)
Glucose-Capillary: 122 mg/dL — ABNORMAL HIGH (ref 65–99)

## 2015-06-29 LAB — HEMOGLOBIN A1C
Hgb A1c MFr Bld: 5.5 % (ref 4.8–5.6)
Mean Plasma Glucose: 111 mg/dL

## 2015-06-29 MED ORDER — OXYCODONE HCL 5 MG PO TABS: 5.0000 mg | ORAL_TABLET | ORAL | Status: DC | PRN

## 2015-06-29 MED ORDER — METRONIDAZOLE 500 MG PO TABS: 500.0000 mg | ORAL_TABLET | Freq: Three times a day (TID) | ORAL | Status: DC

## 2015-06-29 MED ORDER — FOLIC ACID 1 MG PO TABS
1.0000 mg | ORAL_TABLET | Freq: Every day | ORAL | Status: DC
Start: 2015-06-29 — End: 2015-06-29

## 2015-06-29 MED ORDER — HEPARIN SOD (PORK) LOCK FLUSH 100 UNIT/ML IV SOLN
500.0000 [IU] | Freq: Once | INTRAVENOUS | Status: AC
  Administered 2015-06-29: 500 [IU] via INTRAVENOUS
  Filled 2015-06-29: qty 5

## 2015-06-29 MED ORDER — CIPROFLOXACIN HCL 500 MG PO TABS: 500.0000 mg | ORAL_TABLET | Freq: Two times a day (BID) | ORAL | Status: DC

## 2015-06-29 NOTE — Discharge Summary (Signed)
Physician Discharge Summary  Mccabe Gloria VPX:[id number] DOB: Jul 10, 1946 DOA: 06/27/2015  PCP: [name], MD  Admit date: 06/27/2015 Discharge date: 06/29/2015  Time spent: 35 minutes  Recommendations for Outpatient Follow-up:  1. Follow up with oncologist for further care 2. Needs hb  3. Follow up with GI as planned.   Discharge Diagnoses:    Acute colitis   Metastatic colon cancer to liver   Chronic combined systolic and diastolic heart failure   Colon cancer   Discharge Condition: stable  Diet recommendation:bland diet  There were no vitals filed for this visit.  History of present illness:  Grantham Hippert is a 69 y.o. 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 He states that the pain started this morning. He states the pain is located around the umbilical area and does not appear to radiate. He has had diarrhea also. He denies having any blood in the stools. He states too numerous to count as far as bowel movements. Patient states with the diarrhea there was no relief of the pain. Now in the ED his pain is better. He has had no fever or chills noted. No nausea or vomiting noted. Patient has no chest pain noted. He denies having any dizziness no syncope. He does feel weak however. He has a previous history of ulcers but at that time he had no pain.He is followed by Gi and last seen on 8/17. He has been diagnosed with colon cancer in January of this year and underwent chemotherapy liver biopsy showed mets. CT scan done today shows stability of the previous finding with new findings of pancolitis.  Hospital Course:  1. Acute colitis -continue withcipro and flagyl. Will provide 10 day treatment -C diff negative -tolerating regular diet -stool occult blood along with ova and parasites negative  2. Metastatic colon cancer to liver -followed by oncology last seen 8/24 chemo on hold -CT scan results  reviewed appear unchanged other than findings of colitis -follow outpatient with Dr Burr Medico  3. Chronic Combined systolic and diastolic heart failure -history of non-ischemic cardiomyopathy with last EF of 25% -currently appears to be compensated -continue with beta blockers  4. Recent GI bleed -patient has history of gastric ulcers with bleeding in April along with grade 2 varices followed by gastroenterology and is planned for a follow up EGD -will continue with iron supplementation and monitor labs -will continue with protonix No evidence of active GI bleed.   5. HTN -will continue with current medications -monitor pressures  6. DM Type II uncomplicated -hold metformin and linagliptin, resume at discharge -SSI>   7. Hepatits B with Portal HTN -followed in liver clinic -will continue with nadolol  8. Chronic anemia - Hgb stable. Prior outpatient hb at 9  Procedures: none Consultations:  Dr Burr Medico  Discharge Exam: Filed Vitals:   06/29/15 0558  BP: 147/87  Pulse: 62  Temp: 97.9 F (36.6 C)  Resp: 16    General: NAD Cardiovascular: S 1, S 2 RRR Respiratory: CTA Abdomen soft nt  Discharge Instructions   Discharge Instructions    Diet - low sodium heart healthy    Complete by:  As directed      Increase activity slowly    Complete by:  As directed           Current Discharge Medication List    START taking these medications   Details  ciprofloxacin (CIPRO) 500 MG  tablet Take 1 tablet (500 mg total) by mouth 2 (two) times daily. Qty: 20 tablet, Refills: 0    metroNIDAZOLE (FLAGYL) 500 MG tablet Take 1 tablet (500 mg total) by mouth every 8 (eight) hours. Qty: 30 tablet, Refills: 0    oxyCODONE (OXY IR/ROXICODONE) 5 MG immediate release tablet Take 1 tablet (5 mg total) by mouth every 4 (four) hours as needed for moderate pain. Qty: 30 tablet, Refills: 0      CONTINUE these medications which have NOT CHANGED   Details  clindamycin (CLINDAGEL) 1  % gel Apply topically 2 (two) times daily. Qty: 30 g, Refills: 2    hydrocortisone 2.5 % cream Apply topically 2 (two) times daily. Once daily on face and twice daily on body Qty: 30 g, Refills: 2    Iron Polysacch Cmplx-B12-FA (FERREX 150 FORTE PO) Take 1 tablet by mouth 2 (two) times daily.    lidocaine-prilocaine (EMLA) cream Apply 1 application topically as needed. Apply to [facility] cath at least one hour before needle stick. Qty: 30 g, Refills: 2    linagliptin (TRADJENTA) 5 MG TABS tablet Take 1 tablet (5 mg total) by mouth daily. Qty: 30 tablet, Refills: 1    lisinopril-hydrochlorothiazide (PRINZIDE,ZESTORETIC) 10-12.5 MG per tablet Take 1 tablet by mouth daily. Refills: 3    metFORMIN (GLUCOPHAGE) 500 MG tablet Take by mouth 2 (two) times daily with a meal.    mirtazapine (REMERON) 15 MG tablet Take 1 tablet (15 mg total) by mouth at bedtime. Qty: 30 tablet, Refills: 2   Associated Diagnoses: Metastatic colon cancer to liver    nadolol (CORGARD) 20 MG tablet Take 1 tablet (20 mg total) by mouth 2 (two) times daily. Qty: 60 tablet, Refills: 0    ondansetron (ZOFRAN) 8 MG tablet Take 1 tablet (8 mg total) by mouth every 8 (eight) hours as needed for nausea or vomiting. Qty: 45 tablet, Refills: 0    pantoprazole (PROTONIX) 40 MG tablet Take 1 tablet (40 mg total) by mouth daily. Qty: 30 tablet, Refills: 11    prochlorperazine (COMPAZINE) 10 MG tablet Take 1 tablet (10 mg total) by mouth every 6 (six) hours as needed for nausea or vomiting. Qty: 30 tablet, Refills: 1   Associated Diagnoses: Metastatic colon cancer to liver      STOP taking these medications     loperamide (IMODIUM A-D) 2 MG tablet      PRESCRIPTION MEDICATION        No Known Allergies    The results of significant diagnostics from this hospitalization (including imaging, microbiology, ancillary and laboratory) are listed below for reference.    Significant Diagnostic Studies: Ct Abdomen Pelvis W  Contrast  06/27/2015   CLINICAL DATA:  Known liver cancer. Patient has had intermittent abdominal pain for 2 weeks.  EXAM: CT ABDOMEN AND PELVIS WITH CONTRAST  TECHNIQUE: Multidetector CT imaging of the abdomen and pelvis was performed using the standard protocol following bolus administration of intravenous contrast.  CONTRAST:  96mL OMNIPAQUE IOHEXOL 300 MG/ML SOLN, 132mL OMNIPAQUE IOHEXOL 300 MG/ML SOLN  COMPARISON:  May 19, 2015  FINDINGS: Multiple heterogeneous liver masses are identified unchanged compared to prior exam. The spleen is enlarged with masses present unchanged compared to prior exam. The pancreas, the kidneys are normal. There is a calcification in the right adrenal gland unchanged. Nodule is identified in the left adrenal gland unchanged There is atherosclerosis of the abdominal aorta without aneurysmal dilatation. Mild large retroperitoneal lymph nodes are identified unchanged.  There is diffuse bowel wall thickening throughout colon with surrounding inflammation. The previously noted colonic wall thickening along the sigmoid colon, site of prior colon cancer is identified. The caliber of the colon is normal. The small bowel is normal without small bowel obstruction. The appendix is normal.  Fluid-filled bladder is normal.  There is no pelvic lymphadenopathy.  IMPRESSION: Interval developed pan colitis either infectious or inflammatory. The previously noted site of colon cancer new sigmoid colon is again identified.  Hepatic, adrenal and splenic metastasis unchanged.   Electronically Signed   By: Abelardo Diesel M.D.   On: 06/27/2015 21:40    Microbiology: Recent Results (from the past 240 hour(s))  C difficile quick scan w PCR reflex     Status: None   Collection Time: 06/27/15 11:39 PM  Result Value Ref Range Status   C Diff antigen NEGATIVE NEGATIVE Final   C Diff toxin NEGATIVE NEGATIVE Final   C Diff interpretation Negative for toxigenic C. difficile  Final  Ova and parasite  examination     Status: None   Collection Time: 06/27/15 11:39 PM  Result Value Ref Range Status   Specimen Description STOOL  Final   Special Requests NONE  Final   Ova and parasites   Final    NO OVA OR PARASITES SEEN Performed at [facility]    Report Status 06/28/2015 FINAL  Final  Stool culture     Status: None (Preliminary result)   Collection Time: 06/27/15 11:39 PM  Result Value Ref Range Status   Specimen Description STOOL  Final   Special Requests NONE  Final   Culture   Final    NO SUSPICIOUS COLONIES, CONTINUING TO HOLD Performed at [facility]    Report Status PENDING  Incomplete     Labs: Basic Metabolic Panel:  Recent Labs Lab 06/27/15 1811 06/28/15 0420 06/29/15 0540  NA 140 140 139  K 4.2 3.9 3.6  CL 103 106 108  CO2 28 27 26   GLUCOSE 213* 113* 96  BUN 13 10 14   CREATININE 0.88 0.95 0.93  CALCIUM 10.5* 9.5 8.2*   Liver Function Tests:  Recent Labs Lab 06/27/15 1811 06/28/15 0420  AST 64* 52*  ALT 46 38  ALKPHOS 303* 221*  BILITOT 1.2 0.9  PROT 9.2* 7.8  ALBUMIN 3.8 3.2*    Recent Labs Lab 06/27/15 1811  LIPASE 23   No results for input(s): AMMONIA in the last 168 hours. CBC:  Recent Labs Lab 06/27/15 1811 06/28/15 0420 06/29/15 0540  WBC 13.6* 10.4 3.8*  HGB 12.1* 11.2* 9.7*  HCT 36.8* 34.2* 29.2*  MCV 91.5 91.0 91.5  PLT 143* 128* 88*   Cardiac Enzymes: No results for input(s): CKTOTAL, CKMB, CKMBINDEX, TROPONINI in the last 168 hours. BNP: BNP (last 3 results) No results for input(s): BNP in the last 8760 hours.  ProBNP (last 3 results) No results for input(s): PROBNP in the last 8760 hours.  CBG:  Recent Labs Lab 06/28/15 1141 06/28/15 1726 06/28/15 2155 06/29/15 0748 06/29/15 1138  GLUCAP 140* 98 116* 129* 122*       Signed:  Zyiere Rosemond A  [facility] 06/29/2015, 12:10 PM

## 2015-06-29 NOTE — Care Management Important Message (Signed)
Important Message  Patient Details  Name: Jason Reilly MRN: [id number] Date of Birth: 07-18-1946   Medicare Important Message Given:  [facility] notification given    Camillo Flaming 06/29/2015, 12:59 [place] Message  Patient Details  Name: Jason Reilly MRN: [id number] Date of Birth: October 31, 1945   Medicare Important Message Given:  Yes-second notification given    Camillo Flaming 06/29/2015, 12:58 PM

## 2015-06-29 NOTE — Progress Notes (Signed)
Discharge instructions reviewed.  Pt discharged home with family.

## 2015-07-01 LAB — STOOL CULTURE

## 2015-07-05 ENCOUNTER — Encounter (HOSPITAL_COMMUNITY): Payer: Self-pay

## 2015-07-05 ENCOUNTER — Encounter: Payer: Self-pay | Admitting: Hematology

## 2015-07-05 ENCOUNTER — Telehealth: Payer: Self-pay | Admitting: Hematology

## 2015-07-05 ENCOUNTER — Ambulatory Visit (HOSPITAL_BASED_OUTPATIENT_CLINIC_OR_DEPARTMENT_OTHER): Payer: Medicare Other | Admitting: Hematology

## 2015-07-05 ENCOUNTER — Other Ambulatory Visit (HOSPITAL_BASED_OUTPATIENT_CLINIC_OR_DEPARTMENT_OTHER): Payer: Medicare Other

## 2015-07-05 VITALS — BP 152/85 | HR 74 | Temp 98.2°F | Resp 18 | Ht 65.0 in | Wt 142.1 lb

## 2015-07-05 DIAGNOSIS — C787 Secondary malignant neoplasm of liver and intrahepatic bile duct: Secondary | ICD-10-CM

## 2015-07-05 DIAGNOSIS — C7800 Secondary malignant neoplasm of unspecified lung: Secondary | ICD-10-CM | POA: Diagnosis not present

## 2015-07-05 DIAGNOSIS — K254 Chronic or unspecified gastric ulcer with hemorrhage: Secondary | ICD-10-CM | POA: Diagnosis not present

## 2015-07-05 DIAGNOSIS — C187 Malignant neoplasm of sigmoid colon: Secondary | ICD-10-CM

## 2015-07-05 DIAGNOSIS — C772 Secondary and unspecified malignant neoplasm of intra-abdominal lymph nodes: Secondary | ICD-10-CM | POA: Diagnosis not present

## 2015-07-05 DIAGNOSIS — C7972 Secondary malignant neoplasm of left adrenal gland: Secondary | ICD-10-CM | POA: Diagnosis not present

## 2015-07-05 DIAGNOSIS — E119 Type 2 diabetes mellitus without complications: Secondary | ICD-10-CM

## 2015-07-05 DIAGNOSIS — I10 Essential (primary) hypertension: Secondary | ICD-10-CM

## 2015-07-05 DIAGNOSIS — C189 Malignant neoplasm of colon, unspecified: Secondary | ICD-10-CM

## 2015-07-05 LAB — COMPREHENSIVE METABOLIC PANEL (CC13)
ALBUMIN: 3.3 g/dL — AB (ref 3.5–5.0)
ALK PHOS: 332 U/L — AB (ref 40–150)
ALT: 51 U/L (ref 0–55)
AST: 80 U/L — ABNORMAL HIGH (ref 5–34)
Anion Gap: 7 mEq/L (ref 3–11)
BILIRUBIN TOTAL: 0.86 mg/dL (ref 0.20–1.20)
BUN: 11.8 mg/dL (ref 7.0–26.0)
CALCIUM: 9.5 mg/dL (ref 8.4–10.4)
CO2: 26 mEq/L (ref 22–29)
Chloride: 103 mEq/L (ref 98–109)
Creatinine: 0.9 mg/dL (ref 0.7–1.3)
EGFR: 88 mL/min/{1.73_m2} — AB (ref >90)
Glucose: 126 mg/dl (ref 70–140)
POTASSIUM: 4.1 meq/L (ref 3.5–5.1)
Sodium: 136 mEq/L (ref 136–145)
TOTAL PROTEIN: 8.5 g/dL — AB (ref 6.4–8.3)

## 2015-07-05 LAB — CBC WITH DIFFERENTIAL/PLATELET
BASO%: 0.7 % (ref 0.0–2.0)
BASOS ABS: 0 10*3/uL (ref 0.0–0.1)
EOS ABS: 0.1 10*3/uL (ref 0.0–0.5)
EOS%: 2.7 % (ref 0.0–7.0)
HEMATOCRIT: 36.2 % — AB (ref 38.4–49.9)
HEMOGLOBIN: 12 g/dL — AB (ref 13.0–17.1)
LYMPH#: 0.6 10*3/uL — AB (ref 0.9–3.3)
LYMPH%: 14.6 % (ref 14.0–49.0)
MCH: 30.1 pg (ref 27.2–33.4)
MCHC: 33.2 g/dL (ref 32.0–36.0)
MCV: 90.6 fL (ref 79.3–98.0)
MONO#: 0.5 10*3/uL (ref 0.1–0.9)
MONO%: 10.8 % (ref 0.0–14.0)
NEUT#: 3.1 10*3/uL (ref 1.5–6.5)
NEUT%: 71.2 % (ref 39.0–75.0)
Platelets: 129 10*3/uL — ABNORMAL LOW (ref 140–400)
RBC: 3.99 10*6/uL — ABNORMAL LOW (ref 4.20–5.82)
RDW: 20.5 % — AB (ref 11.0–14.6)
WBC: 4.3 10*3/uL (ref 4.0–10.3)

## 2015-07-05 NOTE — Progress Notes (Signed)
[place]  [phone] [phone]  Clinic follow up Note   Patient Care Team: [name], MD as PCP - General (Specialist) [name], CRNP as Nurse Practitioner (Nurse Practitioner) Truitt Merle, MD as Consulting Physician (Hematology) Ladene Artist, MD as Consulting Physician (Gastroenterology) 07/05/2015  CHIEF COMPLAINTS Follow up metastatic sigmoid colon cancer  Oncology History   He is a Metastatic colon cancer to liver   Staging form: Colon and Rectum, AJCC 7th Edition     Clinical: Stage Unknown ([place], NX, M1) - Unsigned      Metastatic colon cancer to liver   10/28/2014 Tumor Marker AFP 3.2 CEA > 10,000 CA 19-9 12,929.6. tumor (-) KRAS and NRAS mutation.    11/10/2014 Imaging PET scan showed hypermetabolic mass in the sigmoid colon was noted metastasis in the retroperitoneum. Probable left adrenal and pulmonary metastasis, and diffuse liver metastasis.   11/21/2014 Initial Diagnosis Metastatic colon cancer to liver, lung, abd nodes and left adrenal gland. Diagnosis was made by liver biopsy.    11/30/2014 -  Chemotherapy First line chemo [id number], Panitumumab added from second cycle    12/28/2014 - 12/31/2014 Hospital Admission Was admitted for dehydration, neutropenia fever with UTI, and severe skin rash.   02/08/2015 - 02/12/2015 Hospital Admission He was admitted for upper GI bleeding, e.g. showed a gastric ulcer with clots, status post appendectomy injection. He also received a blood transfusion.   03/01/2015 Tumor Marker CEA 694, CA19.9 462   03/13/2015 Imaging CT CAP showed partial response, no new lesions.    03/15/2015 - 05/23/2015 Chemotherapy restart FOLFOX, held again on 8/9 due to GI bleeding    05/31/2015 - 06/02/2015 Hospital Admission He was admitted to [facility] in [place] due to upper GI bleeding, EGD showed gastric and dudenal ulcers    06/27/2015 - 06/29/2015 Hospital Admission he was admitted for dairrhea and pancolitis, c-diff and  stool cultures were negative, treated with antibiotics     CURRENT THERAPY: supportive care   HISTORY OF PRESENTING ILLNESS:  Jason Reilly 69 y.o. male is here because of abnormal CT findings, which is very suspicious for malignancy. He is on ranitidine from [place], has been on in the [place] for 16 years. He came in with his son and an interpreter.  He has been feeling fatigued since two month ago. He is still able to do all ADLs. He otherwise denies any pain, bloating or nausea.  He lost about 20lbs in 3 month. His appetite is lower than before, eats less, no change of his bowl habits.  She denied any hematochezia or melana. Per his son, he has had some personality changes daily, irritable, slightly confused some time.  He was evaluated by his primary care physician. Lab test reviewed hepatitis B infection, which he did not know before, and elevated alkaline phosphatase, his liver function and the rest of the liver function was not remarkable. [place] of abdomen was obtained on 07/22/2014, which showed diffusely abnormal liver with multiple echogenic lesions. CT of abdomen with and without contrast was done on 08/26/2014, which reviewed here at a medically with multiple large partially calcified hepatic masses consistent with metastatic disease. Mild retroperitoneal adenopathy with the largest node measuring 1.6 cm. And nonspecific 1.4 cm left adrenal nodule was also noticed. His tumor marker showed CEA greater than 10,000, CA 19-9 12,929, AFP 3.2 (normal). He was referred to [place] [facility] liver clinic and was evaluated by nurse practitioner [name]. Treatment for hepatitis B was  not recommended based on his virus load.  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 He was evaluated by a cardiologist in 2014. He denies any significant dyspnea on exertion. No leg swollen.  INTERIM HISTORY: Jason Reilly returns for follow-up. He developed significant diarrhea and  abdominal pain, was admitted to [facility] on 06/27/2015. CT scan of abdomen showed pain colitis, C. difficile and stool cultures were negative. He was treated with antibiotics, with significant symptoms improvement. He was discharged home 2 days later. He was supposed to continue Flagyl at home, but the prescription was on signed and he could not feel it. He did not call my office for that. Since hospital discharge, he has felt much better. He denies any pain, bowel movement is normal, no melena or hematochezia. No nausea or other symptoms. He is eating well. He is accompanied to the clinic by his daughter.  MEDICAL HISTORY:  Past Medical History  Diagnosis Date  . Tachycardia   . Abnormal EKG   . Hypertension   . Non-ischemic cardiomyopathy   . Hepatitis B   . H. pylori infection   . Gastric ulcer   . Metastatic colon cancer to liver   . Diabetes mellitus without complication     metformin    SURGICAL HISTORY: Past Surgical History  Procedure Laterality Date  . Nuclear stress test  03/03/2013    High risk - consistent with nonischemic cardiomyopathy  . Left and right heart catheterization with coronary angiogram N/A 03/29/2013    Procedure: LEFT AND RIGHT HEART CATHETERIZATION WITH CORONARY ANGIOGRAM;  Surgeon: Pixie Casino, MD;  Location: [facility] CATH LAB;  Service: Cardiovascular;  Laterality: N/A;  . Esophagogastroduodenoscopy N/A 02/08/2015    Procedure: ESOPHAGOGASTRODUODENOSCOPY (EGD);  Surgeon: Ladene Artist, MD;  Location: [facility] ENDOSCOPY;  Service: Endoscopy;  Laterality: N/A;    SOCIAL HISTORY: Social History   Social History  . Marital Status: Married    Spouse Name: N/A  . Number of Children: N/A  . Years of Education: N/A   Occupational History  . Not on file.   Social History Main Topics  . Smoking status: Former Research scientist (life sciences)  . Smokeless tobacco: Never Used  . Alcohol Use: Yes     Comment: occasional  . Drug Use: No  . Sexual Activity: No   Other Topics  Concern  . Not on file   Social History Narrative   Married   Enjoys walking   Has lived in [place] > 16 years    FAMILY HISTORY: No family history of liver disease or malignancy.  ALLERGIES:  has No Known Allergies.  MEDICATIONS:  Current Outpatient Prescriptions on File Prior to Visit  Medication Sig Dispense Refill  . ciprofloxacin (CIPRO) 500 MG tablet Take 1 tablet (500 mg total) by mouth 2 (two) times daily. 20 tablet 0  . lidocaine-prilocaine (EMLA) cream Apply 1 application topically as needed. Apply to [facility] cath at least one hour before needle stick. 30 g 2  . lisinopril-hydrochlorothiazide (PRINZIDE,ZESTORETIC) 10-12.5 MG per tablet Take 1 tablet by mouth daily.  3  . metFORMIN (GLUCOPHAGE) 500 MG tablet Take by mouth 2 (two) times daily with a meal.    . nadolol (CORGARD) 20 MG tablet Take 1 tablet (20 mg total) by mouth 2 (two) times daily. 60 tablet 0  . pantoprazole (PROTONIX) 40 MG tablet Take 1 tablet (40 mg total) by mouth daily. (Patient taking differently: Take 40 mg by mouth 2 (two) times daily. )  30 tablet 11  . prochlorperazine (COMPAZINE) 10 MG tablet Take 1 tablet (10 mg total) by mouth every 6 (six) hours as needed for nausea or vomiting. 30 tablet 1  . metroNIDAZOLE (FLAGYL) 500 MG tablet Take 1 tablet (500 mg total) by mouth every 8 (eight) hours. (Patient not taking: Reported on 07/05/2015) 30 tablet 0  . ondansetron (ZOFRAN) 8 MG tablet Take 1 tablet (8 mg total) by mouth every 8 (eight) hours as needed for nausea or vomiting. (Patient not taking: Reported on 07/05/2015) 45 tablet 0   No current facility-administered medications on file prior to visit.  ;   REVIEW OF SYSTEMS:   Constitutional: Denies fevers, chills or abnormal night sweats, (+) fatigue  Eyes: Denies blurriness of vision, double vision or watery eyes Ears, nose, mouth, throat, and face: Denies mucositis or sore throat Respiratory: Denies cough, dyspnea or wheezes Cardiovascular: Denies  palpitation, chest discomfort or lower extremity swelling Gastrointestinal: Denies nausea, heartburn or change in bowel habits Skin: Denies abnormal skin rashes Lymphatics: Denies new lymphadenopathy or easy bruising Neurological:Denies numbness, tingling or new weaknesses Behavioral/Psych: Mood is stable, no new changes, (+) insomnia All other systems were reviewed with the patient and are negative.  PHYSICAL EXAMINATION: BP 152/85 mmHg  Pulse 74  Temp(Src) 98.2 F (36.8 C) (Oral)  Resp 18  Ht _0  (1.651 m)  Wt 142 lb 1.6 oz (64.456 kg)  BMI 23.65 kg/m2  SpO2 100%  ECOG PERFORMANCE STATUS: 1 Vital sign were taken at in the infusion room, within normal limits. GENERAL:alert, no distress and comfortable SKIN: (+) Dry skin, scatter acne like skin rash es on the face, neck and trunk, better than last time, no skin ulcer or discharge. EYES: normal, conjunctiva are pink and non-injected, sclera clear OROPHARYNX:no exudate, no erythema and lips, buccal mucosa, and tongue normal  NECK: supple, thyroid normal size, non-tender, without nodularity LYMPH:  no palpable lymphadenopathy in the cervical, axillary or inguinal LUNGS: clear to auscultation and percussion with normal breathing effort, (+) crackles on b/l lung base  HEART: regular rate & rhythm and no murmurs and no lower extremity edema ABDOMEN:abdomen soft, non-tender, no hepatomegaly, no splenomegaly and normal bowel sounds Musculoskeletal:no cyanosis of digits and no clubbing  PSYCH: alert & oriented x 3 with fluent speech NEURO: no focal motor/sensory deficits  LABORATORY DATA:  I have reviewed the data as listed CBC Latest Ref Rng 07/05/2015 06/29/2015 06/28/2015  WBC 4.0 - 10.3 10e3/uL 4.3 3.8(L) 10.4  Hemoglobin 13.0 - 17.1 g/dL 12.0(L) 9.7(L) 11.2(L)  Hematocrit 38.4 - 49.9 % 36.2(L) 29.2(L) 34.2(L)  Platelets 140 - 400 10e3/uL 129(L) 88(L) 128(L)    CMP Latest Ref Rng 07/05/2015 06/29/2015 06/28/2015  Glucose 70 - 140 mg/dl  126 96 113(H)  BUN 7.0 - 26.0 mg/dL 11._1 Creatinine 0.7 - 1.3 mg/dL 0.9 0.93 0.95  Sodium 136 - 145 mEq/L 136 139 140  Potassium 3.5 - 5.1 mEq/L 4.1 3.6 3.9  Chloride 101 - 111 mmol/L - 108 106  CO2 22 - 29 mEq/L _2 Calcium 8.4 - 10.4 mg/dL 9.5 8.2(L) 9.5  Total Protein 6.4 - 8.3 g/dL 8.5(H) - 7.8  Total Bilirubin 0.20 - 1.20 mg/dL 0.86 - 0.9  Alkaline Phos 40 - 150 U/L 332(H) - 221(H)  AST 5 - 34 U/L 80(H) - 52(H)  ALT 0 - 55 U/L 51 - 38     INITIAL tumor markers AFP 3.2 CEA > 10,000 CA 19-9 12,929.6  CEA  Status: Finalresult Visible to patient:  Not Released Nextappt: 06/28/2015 at 08:00 AM in Oncology ([facility] ONLY) Dx:  Metastatic colon cancer to liver              Ref Range 2wk ago  33moago  261mogo     CEA 0.0 - 5.0 ng/mL 90.7 (H) 249.5 (H)CM 556.6 (H)CM           Pathology report  Liver, needle/core biopsy - METASTATIC ADENOCARCINOMA, SEE COMMENT. Microscopic Comment The adenocarcinoma demonstrates the following immunophenotype: Cytokeratin 7 - negative expression. Cytokeratin 20 - strong diffuse expression. CD2 - strong diffuse expression. Overall the morphology and immunophenotype are that of metastatic adenocarcinoma primary to colorectum. The recent nuclear medicine scan demonstrating sigmoid mass with associated liver masses is noted.      RADIOGRAPHIC STUDIES: I have personally reviewed the outside CT scan image with patient and his son.   CT chest, abdomen and pelvis with IV contrast on 05/19/2015. IMPRESSION: Mild residual wall thickening at the site of known sigmoid colonic adenocarcinoma.  Improving nodal metastases, as described above, including a dominant 11 mm left para-aortic node.  Improving multifocal hepatic metastases, as described above.  Small bilateral adrenal metastases.  Near complete resolution of prior calcified pulmonary metastases.  [facility]  2 versus was found in the low cervical esophagus, they will 5 mm in largest diameter.  Mild portal hypertension gets dropsy was found in the gastric fundus One oozing cratered gastric ulcer was seen at the incisura and gastric antrum, 7X1078m nonbleeding superficial duodenal ulcers with no stigmata of bleeding was found in the duodenal bowel  ASSESSMENT & PLAN:  68 37ar old [place], with past history of hypertension and dilated nonischemic gammopathy with EF 25%, no clinical signs of heart failure, who was found to have hepatitis B infection lately, and multiple liver lesions on the CT scan. He has extremely high CEA and CA 19-9 levels. PET scan reviewed a hypermetabolic sigmoid colon mass, diffuse liver metastasis, probable lung and adrenal gland metastasis.  1. Metastatic sigmoid colon cancer, with diffuse liver, lungs, node and left adrenal gland metastases. KRAS/NRAS wild type, MSI-stable -Liver biopsy showed metastatic adenocarcinoma. His tumor were strongly positive for CK20 and CD2, consistent with primary colorectal primary. KRAS and NRAS mutations were not detected.  -Pt understands that this is incurable cancer, and he has very high disease burden and overall prognosis is poor. The treatment goal is palliative -His bilirubinemia has improved after first cycle chemotherapy.  -Giving the wild-type KRAS/NRAS genotype, he would benefit from EGFR targeted therapy. He started panitumumab from second cycle chemo. He had G2-3 skin rash with neutropenic fever, and was admitted for GI bleeding after second dose panitumumab. Will hold on it for now.  -Avastin contraindicated due to his gastric ulcer and bleeding in April 2016 and Aug 2016  -I discussed his restaging CT scan from 7/22, which showed continue response. No new lesions. He is doing well and his tumor marker has come down dramatically, supporting good clinical response.  -Due to the second episode of upper GI bleeding and ulcers, I'll  hold his chemotherapy, until his ulcers are healed  -Due to the possible relationship of chemotherapy and upper GI ulcer in the bleeding, I probably will change his chemotherapy treatment to [place] panitumumab -I will send his tumor for [facility] test to see if he has additional mutations such as HER2 and BRAF -I will restart panitumumab next week, I'll  add on chemotherapy irrinotecan if his ulcers well healed on his repeated endoscopy in Oct.   2. Gastric ulcer with significant GI bleeding in April and Aug 2016 -He is on PPI, continue twice daily  -He will follow-up with Dr. Fuller Plan and repeated EGD in Oct  -continue Nadolol 54m bid, per Dr. SFuller Plan 3. Newly diagnosed diabetes -HbA1c was 8.5, blood glucose not well controlled  -We again discussed diet and then close monitoring sugar at home. -Continue medication, follow up with primary care physician.  4. HTN, Dilated nonischemic ischemia cardiomyopathy with EF 25% -He is clinically doing well without symptoms of CHF. However this is probably going to impact his chemotherapy. I'll try to avoid cardiotoxic chemotherapy agent and avoid fluid overload during chemotherapy. -Continue follow-up with cardiology.  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 Follow-up with liver clinic.  7. Malnutrition -I encouraged him to eat more, and take supplements as needed. -follow up with Dietitian   8. Worsening Anemia secondary to GI bleeding and iron deficiency  -Repeat lab on 06/06/2015 showed ferritin 117, serum iron 24, saturation 8%, which supports iron deficiency -He received IV Feraheme again in Aug 2016 after GI bleeding  -Hb 12 today, improved   Plan -restart Panitumumab within the next week -continue to hold chemo for now -I will see him back in 3 weeks with second dose panitumumab   All questions were answered. The patient knows to call the clinic with any problems,  questions or concerns.  I spent 25 minutes counseling the patient face to face. The total time spent in the appointment was 30 minutes and more than 50% was on counseling.     FTruitt Merle MD 07/05/2015   8:59 AM

## 2015-07-05 NOTE — Telephone Encounter (Signed)
Gave adn prnted appt sched and avs for pt for SEpt °

## 2015-07-06 LAB — CEA: CEA: 386.8 ng/mL — AB (ref 0.0–5.0)

## 2015-07-06 LAB — CANCER ANTIGEN 19-9: CA 19-9: 310.6 U/mL — ABNORMAL HIGH (ref <35.0)

## 2015-07-07 ENCOUNTER — Other Ambulatory Visit: Payer: Medicare Other

## 2015-07-12 ENCOUNTER — Ambulatory Visit (HOSPITAL_BASED_OUTPATIENT_CLINIC_OR_DEPARTMENT_OTHER): Payer: Medicare Other

## 2015-07-12 ENCOUNTER — Other Ambulatory Visit: Payer: Self-pay | Admitting: Hematology

## 2015-07-12 ENCOUNTER — Other Ambulatory Visit (HOSPITAL_BASED_OUTPATIENT_CLINIC_OR_DEPARTMENT_OTHER): Payer: Medicare Other

## 2015-07-12 VITALS — BP 110/60 | HR 82 | Temp 97.9°F | Resp 19

## 2015-07-12 DIAGNOSIS — C189 Malignant neoplasm of colon, unspecified: Secondary | ICD-10-CM

## 2015-07-12 DIAGNOSIS — Z5112 Encounter for antineoplastic immunotherapy: Secondary | ICD-10-CM | POA: Diagnosis present

## 2015-07-12 DIAGNOSIS — E119 Type 2 diabetes mellitus without complications: Secondary | ICD-10-CM | POA: Diagnosis not present

## 2015-07-12 DIAGNOSIS — C187 Malignant neoplasm of sigmoid colon: Secondary | ICD-10-CM | POA: Diagnosis present

## 2015-07-12 DIAGNOSIS — C787 Secondary malignant neoplasm of liver and intrahepatic bile duct: Secondary | ICD-10-CM

## 2015-07-12 LAB — COMPREHENSIVE METABOLIC PANEL (CC13)
ALK PHOS: 269 U/L — AB (ref 40–150)
ALT: 37 U/L (ref 0–55)
ANION GAP: 5 meq/L (ref 3–11)
AST: 54 U/L — ABNORMAL HIGH (ref 5–34)
Albumin: 3.2 g/dL — ABNORMAL LOW (ref 3.5–5.0)
BUN: 12.2 mg/dL (ref 7.0–26.0)
CALCIUM: 9.7 mg/dL (ref 8.4–10.4)
CHLORIDE: 105 meq/L (ref 98–109)
CO2: 30 mEq/L — ABNORMAL HIGH (ref 22–29)
CREATININE: 1 mg/dL (ref 0.7–1.3)
EGFR: 80 mL/min/{1.73_m2} — ABNORMAL LOW (ref >90)
Glucose: 211 mg/dl — ABNORMAL HIGH (ref 70–140)
Potassium: 4.5 mEq/L (ref 3.5–5.1)
Sodium: 139 mEq/L (ref 136–145)
Total Bilirubin: 0.7 mg/dL (ref 0.20–1.20)
Total Protein: 8 g/dL (ref 6.4–8.3)

## 2015-07-12 LAB — CBC WITH DIFFERENTIAL/PLATELET
BASO%: 0.7 % (ref 0.0–2.0)
BASOS ABS: 0 10*3/uL (ref 0.0–0.1)
EOS%: 3.2 % (ref 0.0–7.0)
Eosinophils Absolute: 0.1 10*3/uL (ref 0.0–0.5)
HEMATOCRIT: 36.9 % — AB (ref 38.4–49.9)
HGB: 12.1 g/dL — ABNORMAL LOW (ref 13.0–17.1)
LYMPH#: 0.8 10*3/uL — AB (ref 0.9–3.3)
LYMPH%: 20.8 % (ref 14.0–49.0)
MCH: 30.1 pg (ref 27.2–33.4)
MCHC: 32.9 g/dL (ref 32.0–36.0)
MCV: 91.5 fL (ref 79.3–98.0)
MONO#: 0.3 10*3/uL (ref 0.1–0.9)
MONO%: 8.4 % (ref 0.0–14.0)
NEUT#: 2.5 10*3/uL (ref 1.5–6.5)
NEUT%: 66.9 % (ref 39.0–75.0)
PLATELETS: 131 10*3/uL — AB (ref 140–400)
RBC: 4.03 10*6/uL — ABNORMAL LOW (ref 4.20–5.82)
RDW: 19 % — ABNORMAL HIGH (ref 11.0–14.6)
WBC: 3.8 10*3/uL — ABNORMAL LOW (ref 4.0–10.3)

## 2015-07-12 LAB — MAGNESIUM (CC13): Magnesium: 1.6 mg/dl (ref 1.5–2.5)

## 2015-07-12 MED ORDER — SODIUM CHLORIDE 0.9 % IV SOLN
6.0000 mg/kg | Freq: Once | INTRAVENOUS | Status: AC
  Administered 2015-07-12: 380 mg via INTRAVENOUS
  Filled 2015-07-12: qty 19

## 2015-07-12 MED ORDER — HEPARIN SOD (PORK) LOCK FLUSH 100 UNIT/ML IV SOLN
500.0000 [IU] | Freq: Once | INTRAVENOUS | Status: AC | PRN
  Administered 2015-07-12: 500 [IU]
  Filled 2015-07-12: qty 5

## 2015-07-12 MED ORDER — SODIUM CHLORIDE 0.9 % IJ SOLN
10.0000 mL | INTRAMUSCULAR | Status: DC | PRN
  Administered 2015-07-12: 10 mL
  Filled 2015-07-12: qty 10

## 2015-07-12 MED ORDER — SODIUM CHLORIDE 0.9 % IV SOLN
2.0000 g | Freq: Once | INTRAVENOUS | Status: AC
  Administered 2015-07-12: 2 g via INTRAVENOUS
  Filled 2015-07-12: qty 4

## 2015-07-12 MED ORDER — SODIUM CHLORIDE 0.9 % IV SOLN
Freq: Once | INTRAVENOUS | Status: AC
  Administered 2015-07-12: 10:00:00 via INTRAVENOUS

## 2015-07-12 NOTE — Patient Instructions (Signed)
[facility] Discharge Instructions for Patients Receiving Chemotherapy  Today you received the following chemotherapy agents Vectibix  To help prevent nausea and vomiting after your treatment, we encourage you to take your nausea medication as directed.   If you develop nausea and vomiting that is not controlled by your nausea medication, call the clinic.   BELOW ARE SYMPTOMS THAT SHOULD BE REPORTED IMMEDIATELY:  *FEVER GREATER THAN 100.5 F  *CHILLS WITH OR WITHOUT FEVER  NAUSEA AND VOMITING THAT IS NOT CONTROLLED WITH YOUR NAUSEA MEDICATION  *UNUSUAL SHORTNESS OF BREATH  *UNUSUAL BRUISING OR BLEEDING  TENDERNESS IN MOUTH AND THROAT WITH OR WITHOUT PRESENCE OF ULCERS  *URINARY PROBLEMS  *BOWEL PROBLEMS  UNUSUAL RASH Items with * indicate a potential emergency and should be followed up as soon as possible.  Feel free to call the clinic you have any questions or concerns. The clinic phone number is [phone].  Please show the CHEMO ALERT CARD at check-in to the Emergency Department and triage nurse.   

## 2015-07-21 ENCOUNTER — Ambulatory Visit: Payer: Medicare Other | Admitting: Hematology

## 2015-07-21 ENCOUNTER — Other Ambulatory Visit: Payer: Medicare Other

## 2015-07-26 ENCOUNTER — Encounter: Payer: Self-pay | Admitting: Hematology

## 2015-07-26 ENCOUNTER — Ambulatory Visit (HOSPITAL_BASED_OUTPATIENT_CLINIC_OR_DEPARTMENT_OTHER): Payer: Medicare Other | Admitting: Hematology

## 2015-07-26 ENCOUNTER — Telehealth: Payer: Self-pay | Admitting: Hematology

## 2015-07-26 ENCOUNTER — Other Ambulatory Visit (HOSPITAL_BASED_OUTPATIENT_CLINIC_OR_DEPARTMENT_OTHER): Payer: Medicare Other

## 2015-07-26 ENCOUNTER — Ambulatory Visit (HOSPITAL_BASED_OUTPATIENT_CLINIC_OR_DEPARTMENT_OTHER): Payer: Medicare Other

## 2015-07-26 VITALS — BP 124/87 | HR 77 | Temp 97.7°F | Resp 18 | Ht 65.0 in | Wt 138.8 lb

## 2015-07-26 DIAGNOSIS — R21 Rash and other nonspecific skin eruption: Secondary | ICD-10-CM | POA: Diagnosis not present

## 2015-07-26 DIAGNOSIS — C7800 Secondary malignant neoplasm of unspecified lung: Secondary | ICD-10-CM | POA: Diagnosis not present

## 2015-07-26 DIAGNOSIS — C787 Secondary malignant neoplasm of liver and intrahepatic bile duct: Secondary | ICD-10-CM

## 2015-07-26 DIAGNOSIS — C187 Malignant neoplasm of sigmoid colon: Secondary | ICD-10-CM

## 2015-07-26 DIAGNOSIS — D500 Iron deficiency anemia secondary to blood loss (chronic): Secondary | ICD-10-CM | POA: Diagnosis not present

## 2015-07-26 DIAGNOSIS — C7972 Secondary malignant neoplasm of left adrenal gland: Secondary | ICD-10-CM | POA: Diagnosis not present

## 2015-07-26 DIAGNOSIS — C189 Malignant neoplasm of colon, unspecified: Secondary | ICD-10-CM

## 2015-07-26 DIAGNOSIS — D509 Iron deficiency anemia, unspecified: Secondary | ICD-10-CM

## 2015-07-26 DIAGNOSIS — C772 Secondary and unspecified malignant neoplasm of intra-abdominal lymph nodes: Secondary | ICD-10-CM

## 2015-07-26 DIAGNOSIS — K259 Gastric ulcer, unspecified as acute or chronic, without hemorrhage or perforation: Secondary | ICD-10-CM

## 2015-07-26 DIAGNOSIS — Z5112 Encounter for antineoplastic immunotherapy: Secondary | ICD-10-CM | POA: Diagnosis present

## 2015-07-26 DIAGNOSIS — E119 Type 2 diabetes mellitus without complications: Secondary | ICD-10-CM | POA: Diagnosis not present

## 2015-07-26 LAB — COMPREHENSIVE METABOLIC PANEL (CC13)
ALT: 29 U/L (ref 0–55)
ANION GAP: 5 meq/L (ref 3–11)
AST: 43 U/L — AB (ref 5–34)
Albumin: 3.3 g/dL — ABNORMAL LOW (ref 3.5–5.0)
Alkaline Phosphatase: 219 U/L — ABNORMAL HIGH (ref 40–150)
BILIRUBIN TOTAL: 0.82 mg/dL (ref 0.20–1.20)
BUN: 13.5 mg/dL (ref 7.0–26.0)
CALCIUM: 9.2 mg/dL (ref 8.4–10.4)
CHLORIDE: 102 meq/L (ref 98–109)
CO2: 28 mEq/L (ref 22–29)
CREATININE: 1 mg/dL (ref 0.7–1.3)
EGFR: 81 mL/min/{1.73_m2} — ABNORMAL LOW (ref >90)
Glucose: 142 mg/dl — ABNORMAL HIGH (ref 70–140)
Potassium: 4.4 mEq/L (ref 3.5–5.1)
Sodium: 135 mEq/L — ABNORMAL LOW (ref 136–145)
TOTAL PROTEIN: 8 g/dL (ref 6.4–8.3)

## 2015-07-26 LAB — CBC WITH DIFFERENTIAL/PLATELET
BASO%: 0.5 % (ref 0.0–2.0)
Basophils Absolute: 0 10*3/uL (ref 0.0–0.1)
EOS%: 3.1 % (ref 0.0–7.0)
Eosinophils Absolute: 0.1 10*3/uL (ref 0.0–0.5)
HEMATOCRIT: 38.1 % — AB (ref 38.4–49.9)
HEMOGLOBIN: 12.6 g/dL — AB (ref 13.0–17.1)
LYMPH#: 0.8 10*3/uL — AB (ref 0.9–3.3)
LYMPH%: 20.8 % (ref 14.0–49.0)
MCH: 29.5 pg (ref 27.2–33.4)
MCHC: 33.1 g/dL (ref 32.0–36.0)
MCV: 89.2 fL (ref 79.3–98.0)
MONO#: 0.3 10*3/uL (ref 0.1–0.9)
MONO%: 8.5 % (ref 0.0–14.0)
NEUT%: 67.1 % (ref 39.0–75.0)
NEUTROS ABS: 2.7 10*3/uL (ref 1.5–6.5)
PLATELETS: 92 10*3/uL — AB (ref 140–400)
RBC: 4.28 10*6/uL (ref 4.20–5.82)
RDW: 16.8 % — AB (ref 11.0–14.6)
WBC: 4.1 10*3/uL (ref 4.0–10.3)

## 2015-07-26 MED ORDER — CLINDAMYCIN PHOSPHATE 1 % EX GEL: Freq: Two times a day (BID) | CUTANEOUS | Status: DC

## 2015-07-26 MED ORDER — SODIUM CHLORIDE 0.9 % IV SOLN: Freq: Once | INTRAVENOUS | Status: DC

## 2015-07-26 MED ORDER — LIDOCAINE-PRILOCAINE 2.5-2.5 % EX CREA: 1.0000 "application " | TOPICAL_CREAM | CUTANEOUS | Status: DC | PRN

## 2015-07-26 MED ORDER — SODIUM CHLORIDE 0.9 % IV SOLN
Freq: Once | INTRAVENOUS | Status: AC
  Administered 2015-07-26: 14:00:00 via INTRAVENOUS

## 2015-07-26 MED ORDER — SODIUM CHLORIDE 0.9 % IJ SOLN
10.0000 mL | INTRAMUSCULAR | Status: DC | PRN
  Administered 2015-07-26: 10 mL
  Filled 2015-07-26: qty 10

## 2015-07-26 MED ORDER — HEPARIN SOD (PORK) LOCK FLUSH 100 UNIT/ML IV SOLN
500.0000 [IU] | Freq: Once | INTRAVENOUS | Status: DC | PRN
  Filled 2015-07-26: qty 5

## 2015-07-26 MED ORDER — FERUMOXYTOL INJECTION 510 MG/17 ML: 510.0000 mg | Freq: Once | INTRAVENOUS | Status: DC

## 2015-07-26 MED ORDER — HYDROCORTISONE 2.5 % EX CREA: TOPICAL_CREAM | Freq: Two times a day (BID) | CUTANEOUS | Status: DC

## 2015-07-26 MED ORDER — HEPARIN SOD (PORK) LOCK FLUSH 100 UNIT/ML IV SOLN
500.0000 [IU] | Freq: Once | INTRAVENOUS | Status: AC | PRN
  Administered 2015-07-26: 500 [IU]
  Filled 2015-07-26: qty 5

## 2015-07-26 MED ORDER — SODIUM CHLORIDE 0.9 % IV SOLN
400.0000 mg | Freq: Once | INTRAVENOUS | Status: AC
  Administered 2015-07-26: 400 mg via INTRAVENOUS
  Filled 2015-07-26: qty 20

## 2015-07-26 MED ORDER — SODIUM CHLORIDE 0.9 % IJ SOLN
10.0000 mL | INTRAMUSCULAR | Status: DC | PRN
  Filled 2015-07-26: qty 10

## 2015-07-26 NOTE — Progress Notes (Signed)
[place]  Telephone:([phone]) [phone] Fax:([phone]) [phone]  Clinic follow up Note   Patient Care Team: [name], MD as PCP - General (Specialist) [name], CRNP as Nurse Practitioner (Nurse Practitioner) Truitt Merle, MD as Consulting Physician (Hematology) Ladene Artist, MD as Consulting Physician (Gastroenterology) 07/26/2015  CHIEF COMPLAINTS Follow up metastatic sigmoid colon cancer  Oncology History   He is a Metastatic colon cancer to liver   Staging form: Colon and Rectum, AJCC 7th Edition     Clinical: Stage Unknown ([place], NX, M1) - Unsigned      Metastatic colon cancer to liver   10/28/2014 Tumor Marker AFP 3.2 CEA > 10,000 CA 19-9 12,929.6. tumor (-) KRAS and NRAS mutation.    11/10/2014 Imaging PET scan showed hypermetabolic mass in the sigmoid colon was noted metastasis in the retroperitoneum. Probable left adrenal and pulmonary metastasis, and diffuse liver metastasis.   11/21/2014 Initial Diagnosis Metastatic colon cancer to liver, lung, abd nodes and left adrenal gland. Diagnosis was made by liver biopsy.    11/30/2014 -  Chemotherapy First line chemo [id number], Panitumumab added from second cycle    12/28/2014 - 12/31/2014 Hospital Admission Was admitted for dehydration, neutropenia fever with UTI, and severe skin rash.   02/08/2015 - 02/12/2015 Hospital Admission He was admitted for upper GI bleeding, e.g. showed a gastric ulcer with clots, status post appendectomy injection. He also received a blood transfusion.   03/01/2015 Tumor Marker CEA 694, CA19.9 462   03/13/2015 Imaging CT CAP showed partial response, no new lesions.    03/15/2015 - 05/23/2015 Chemotherapy restart FOLFOX, held again on 8/9 due to GI bleeding    05/31/2015 - 06/02/2015 Hospital Admission He was admitted to [facility] in [place] due to upper GI bleeding, EGD showed gastric and dudenal ulcers    06/27/2015 - 06/29/2015 Hospital Admission he was admitted for dairrhea and pancolitis, c-diff and  stool cultures were negative, treated with antibiotics     CURRENT THERAPY: panitumumab 74m/kg, every 2 weeks, started on 07/12/2015   HISTORY OF PRESENTING ILLNESS:  PCamaraAdup 69y.o. male is here because of abnormal CT findings, which is very suspicious for malignancy. [place] He came in with his son and an interpreter.  He has been feeling fatigued since two month ago. He is still able to do all ADLs. He otherwise denies any pain, bloating or nausea.  He lost about 20lbs in 3 month. His appetite is lower than before, eats less, no change of his bowl habits.  She denied any hematochezia or melana. Per his son, he has had some personality changes daily, irritable, slightly confused some time.  He was evaluated by his primary care physician. Lab test reviewed hepatitis B infection, which he did not know before, and elevated alkaline phosphatase, his liver function and the rest of the liver function was not remarkable. [place] was obtained on 07/22/2014, which showed diffusely abnormal liver with multiple echogenic lesions. CT of abdomen with and without contrast was done on 08/26/2014, which reviewed here at a medically with multiple large partially calcified hepatic masses consistent with metastatic disease. Mild retroperitoneal adenopathy with the largest node measuring 1.6 cm. And nonspecific 1.4 cm left adrenal nodule was also noticed. His tumor marker showed CEA greater than 10,000, CA 19-9 12,929, AFP 3.2 (normal). [facility]. Treatment for hepatitis B was not recommended based on his virus load.  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 He was evaluated by a cardiologist in 2014. He denies any significant dyspnea on exertion. No leg swollen.  INTERIM HISTORY: Daryn returns for follow-up  and second cycle Vectibix. He tolerated this cycle relatively well, he did developed diffuse skin rash again on his face, neck and upper chest 1 week after the infusion. It itches, and he scratches. He has been using clindamycin gel, but has not used any topical steroids yet. No other noticeable side effects. He denies any pain, his appetite and energy level are relatively well. No fever or chills.  MEDICAL HISTORY:  Past Medical History  Diagnosis Date  . Tachycardia   . Abnormal EKG   . Hypertension   . Non-ischemic cardiomyopathy   . Hepatitis B   . H. pylori infection   . Gastric ulcer   . Metastatic colon cancer to liver   . Diabetes mellitus without complication     metformin    SURGICAL HISTORY: Past Surgical History  Procedure Laterality Date  . Nuclear stress test  03/03/2013    High risk - consistent with nonischemic cardiomyopathy  . Left and right heart catheterization with coronary angiogram N/A 03/29/2013    Procedure: LEFT AND RIGHT HEART CATHETERIZATION WITH CORONARY ANGIOGRAM;  Surgeon: Pixie Casino, MD;  Location: [facility] CATH LAB;  Service: Cardiovascular;  Laterality: N/A;  . Esophagogastroduodenoscopy N/A 02/08/2015    Procedure: ESOPHAGOGASTRODUODENOSCOPY (EGD);  Surgeon: Ladene Artist, MD;  Location: [facility] ENDOSCOPY;  Service: Endoscopy;  Laterality: N/A;    SOCIAL HISTORY: Social History   Social History  . Marital Status: Married    Spouse Name: N/A  . Number of Children: N/A  . Years of Education: N/A   Occupational History  . Not on file.   Social History Main Topics  . Smoking status: Former Research scientist (life sciences)  . Smokeless tobacco: Never Used  . Alcohol Use: Yes     Comment: occasional  . Drug Use: No  . Sexual Activity: No   Other Topics Concern  . Not on file   Social History Narrative   Married   Enjoys walking   Has lived in [place] > 16 years    FAMILY HISTORY: No family history of liver disease or malignancy.  ALLERGIES:  has No Known  Allergies.  MEDICATIONS:  Current Outpatient Prescriptions on File Prior to Visit  Medication Sig Dispense Refill  . ciprofloxacin (CIPRO) 500 MG tablet Take 1 tablet (500 mg total) by mouth 2 (two) times daily. 20 tablet 0  . folic acid (FOLVITE) 1 MG tablet Take 1 mg by mouth daily.    Marland Kitchen lisinopril-hydrochlorothiazide (PRINZIDE,ZESTORETIC) 10-12.5 MG per tablet Take 1 tablet by mouth daily.  3  . metFORMIN (GLUCOPHAGE) 500 MG tablet Take by mouth 2 (two) times daily with a meal.    . metroNIDAZOLE (FLAGYL) 500 MG tablet Take 1 tablet (500 mg total) by mouth every 8 (eight) hours. 30 tablet 0  . nadolol (CORGARD) 20 MG tablet Take 1 tablet (20 mg total) by mouth 2 (two) times daily. 60 tablet 0  . ondansetron (ZOFRAN) 8 MG tablet Take 1 tablet (8 mg total) by mouth every 8 (eight) hours as needed for nausea or vomiting. 45 tablet 0  . pantoprazole (PROTONIX) 40 MG tablet Take 1 tablet (40 mg total) by mouth daily. (Patient taking differently: Take 40 mg by mouth  2 (two) times daily. ) 30 tablet 11  . prochlorperazine (COMPAZINE) 10 MG tablet Take 1 tablet (10 mg total) by mouth every 6 (six) hours as needed for nausea or vomiting. 30 tablet 1   No current facility-administered medications on file prior to visit.  ;   REVIEW OF SYSTEMS:   Constitutional: Denies fevers, chills or abnormal night sweats, (+) fatigue  Eyes: Denies blurriness of vision, double vision or watery eyes Ears, nose, mouth, throat, and face: Denies mucositis or sore throat Respiratory: Denies cough, dyspnea or wheezes Cardiovascular: Denies palpitation, chest discomfort or lower extremity swelling Gastrointestinal: Denies nausea, heartburn or change in bowel habits Skin: Denies abnormal skin rashes Lymphatics: Denies new lymphadenopathy or easy bruising Neurological:Denies numbness, tingling or new weaknesses Behavioral/Psych: Mood is stable, no new changes, (+) insomnia All other systems were reviewed with the  patient and are negative.  PHYSICAL EXAMINATION: BP 124/87 mmHg  Pulse 77  Temp(Src) 97.7 F (36.5 C) (Oral)  Resp 18  Ht '5\' 5"'  (1.651 m)  Wt 138 lb 12.8 oz (62.959 kg)  BMI 23.10 kg/m2  SpO2 100%  ECOG PERFORMANCE STATUS: 1 Vital sign were taken at in the infusion room, within normal limits. GENERAL:alert, no distress and comfortable SKIN: (+) Dry skin, diffuse acne like skin rashes on the face, neck and trunk, no skin ulcer or discharge. EYES: normal, conjunctiva are pink and non-injected, sclera clear OROPHARYNX:no exudate, no erythema and lips, buccal mucosa, and tongue normal  NECK: supple, thyroid normal size, non-tender, without nodularity LYMPH:  no palpable lymphadenopathy in the cervical, axillary or inguinal LUNGS: clear to auscultation and percussion with normal breathing effort, (+) crackles on b/l lung base  HEART: regular rate & rhythm and no murmurs and no lower extremity edema ABDOMEN:abdomen soft, non-tender, no hepatomegaly, no splenomegaly and normal bowel sounds Musculoskeletal:no cyanosis of digits and no clubbing  PSYCH: alert & oriented x 3 with fluent speech NEURO: no focal motor/sensory deficits  LABORATORY DATA:  I have reviewed the data as listed CBC Latest Ref Rng 07/26/2015 07/12/2015 07/05/2015  WBC 4.0 - 10.3 10e3/uL 4.1 3.8(L) 4.3  Hemoglobin 13.0 - 17.1 g/dL 12.6(L) 12.1(L) 12.0(L)  Hematocrit 38.4 - 49.9 % 38.1(L) 36.9(L) 36.2(L)  Platelets 140 - 400 10e3/uL 92(L) 131(L) 129(L)    CMP Latest Ref Rng 07/26/2015 07/12/2015 07/05/2015  Glucose 70 - 140 mg/dl 142(H) 211(H) 126  BUN 7.0 - 26.0 mg/dL 13.5 12.2 11.8  Creatinine 0.7 - 1.3 mg/dL 1.0 1.0 0.9  Sodium 136 - 145 mEq/L 135(L) 139 136  Potassium 3.5 - 5.1 mEq/L 4.4 4.5 4.1  Chloride 101 - 111 mmol/L - - -  CO2 22 - 29 mEq/L 28 30(H) 26  Calcium 8.4 - 10.4 mg/dL 9.2 9.7 9.5  Total Protein 6.4 - 8.3 g/dL 8.0 8.0 8.5(H)  Total Bilirubin 0.20 - 1.20 mg/dL 0.82 0.70 0.86  Alkaline Phos 40 - 150  U/L 219(H) 269(H) 332(H)  AST 5 - 34 U/L 43(H) 54(H) 80(H)  ALT 0 - 55 U/L 29 37 51     INITIAL tumor markers AFP 3.2 CEA > 10,000 CA 19-9 12,929.6  CEA  Status: Finalresult Visible to patient:  Not Released Nextappt: 08/09/2015 at 08:45 AM in Oncology ([facility]) Dx:  Metastatic colon cancer to liver              Ref Range 3wk ago  57moago  224mogo  75m5moo     CEA 0.0 - 5.0 ng/mL 386.8 (  H) 90.7 (H) 249.5 (H)CM 556.6 (H)CM           Pathology report  Liver, needle/core biopsy - METASTATIC ADENOCARCINOMA, SEE COMMENT. Microscopic Comment The adenocarcinoma demonstrates the following immunophenotype: Cytokeratin 7 - negative expression. Cytokeratin 20 - strong diffuse expression. CD2 - strong diffuse expression. Overall the morphology and immunophenotype are that of metastatic adenocarcinoma primary to colorectum. The recent nuclear medicine scan demonstrating sigmoid mass with associated liver masses is noted.  FoundationOne test result:    RADIOGRAPHIC STUDIES: I have personally reviewed the outside CT scan image with patient and his son.   CT chest, abdomen and pelvis with IV contrast on 05/19/2015. IMPRESSION: Mild residual wall thickening at the site of known sigmoid colonic adenocarcinoma.  Improving nodal metastases, as described above, including a dominant 11 mm left para-aortic node.  Improving multifocal hepatic metastases, as described above.  Small bilateral adrenal metastases.  Near complete resolution of prior calcified pulmonary metastases.  EGD in [facility], [place] Grade 2 versus was found in the low cervical esophagus, they will 5 mm in largest diameter.  Mild portal hypertension gets dropsy was found in the gastric fundus One oozing cratered gastric ulcer was seen at the incisura and gastric antrum, 7X26m 2 nonbleeding superficial duodenal ulcers with no stigmata of bleeding was found in the  duodenal bowel  ASSESSMENT & PLAN:  69year old [place], with past history of hypertension and dilated nonischemic gammopathy with EF 25%, no clinical signs of heart failure, who was found to have hepatitis B infection lately, and multiple liver lesions on the CT scan. He has extremely high CEA and CA 19-9 levels. PET scan reviewed a hypermetabolic sigmoid colon mass, diffuse liver metastasis, probable lung and adrenal gland metastasis.  1. Metastatic sigmoid colon cancer, with diffuse liver, lungs, node and left adrenal gland metastases. KRAS/NRAS wild type, MSI-stable -Liver biopsy showed metastatic adenocarcinoma. His tumor were strongly positive for CK20 and CD2, consistent with primary colorectal primary. KRAS and NRAS mutations were not detected.  -Pt understands that this is incurable cancer, and he has very high disease burden and overall prognosis is poor. The treatment goal is palliative -His bilirubinemia has improved after first cycle chemotherapy.  -Giving the wild-type KRAS/NRAS genotype, he would benefit from EGFR targeted therapy. He initially started panitumumab from second cycle chemo. He had G2-3 skin rash with neutropenic fever, and was admitted for GI bleeding after second dose panitumumab. We restarted it again.  -Avastin contraindicated due to his gastric ulcer and bleeding in April 2016 and Aug 2016  -I discussed his restaging CT scan from 7/22, which showed continue response. No new lesions. He is doing well and his tumor marker has come down dramatically, supporting good clinical response.  -Due to the second episode of upper GI bleeding and ulcers, I'll hold his chemotherapy, until his ulcers are healed  -Due to the possible relationship of chemotherapy and upper GI ulcer in the bleeding, I probably will change his chemotherapy treatment to IThayerwhen his ulcers heal well.  -His Foundation one can only test showed EGFR amplification, no KRAS/NRAS/BRAF  mutations. -I have restarted panitumumab, she tolerated well except grade 1 skin rash. We'll continue it every 2 weeks for now. I'll add on chemotherapy irrinotecan if his ulcers well healed on his repeated endoscopy in Oct.   2. Grade 1 skin rashes,  -Secondary to panitumumab  -I reviewed his hydrocortisone 2.5%, and clindamycin gel 1%, and instructed him to use twice daily -He knows to  avoid sun exposure, and call me if it gets worse.  3. Gastric ulcer with significant GI bleeding in April and Aug 2016 -He is on PPI, continue twice daily  -He will follow-up with Dr. Fuller Plan and repeated EGD in Oct  -continue Nadolol 59m bid, per Dr. SFuller Plan 4. Poorly controlled diabetes -HbA1c was 8.5, blood glucose not well controlled  -We again discussed diet and then close monitoring sugar at home. -Continue medication, follow up with primary care physician.  5. HTN, Dilated nonischemic ischemia cardiomyopathy with EF 25% -He is clinically doing well without symptoms of CHF. However this is probably going to impact his chemotherapy. I'll try to avoid cardiotoxic chemotherapy agent and avoid fluid overload during chemotherapy. -Continue follow-up with cardiology.  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 Follow-up with liver clinic.  7. Malnutrition -I encouraged him to eat more, and take supplements as needed. -follow up with Dietitian   8. Anemia secondary to GI bleeding and iron deficiency  -Repeat lab on 06/06/2015 showed ferritin 117, serum iron 24, saturation 8%, which supports iron deficiency -He received IV Feraheme again in Aug 2016 after GI bleeding  -Hb 12.6 today, improved   Plan -second dose panitumumab, and continue every 2 weeks -I will see him back in 2 weeks  All questions were answered. The patient knows to call the clinic with any problems, questions or concerns.  I spent 25 minutes counseling the patient face to  face. The total time spent in the appointment was 30 minutes and more than 50% was on counseling.     FTruitt Merle MD 07/26/2015   4:24 PM

## 2015-07-26 NOTE — Patient Instructions (Signed)
[facility] Discharge Instructions for Patients Receiving Chemotherapy  Today you received the following chemotherapy agents: Vectibix  To help prevent nausea and vomiting after your treatment, we encourage you to take your nausea medication as prescribed by your physician.   If you develop nausea and vomiting that is not controlled by your nausea medication, call the clinic.   BELOW ARE SYMPTOMS THAT SHOULD BE REPORTED IMMEDIATELY:  *FEVER GREATER THAN 100.5 F  *CHILLS WITH OR WITHOUT FEVER  NAUSEA AND VOMITING THAT IS NOT CONTROLLED WITH YOUR NAUSEA MEDICATION  *UNUSUAL SHORTNESS OF BREATH  *UNUSUAL BRUISING OR BLEEDING  TENDERNESS IN MOUTH AND THROAT WITH OR WITHOUT PRESENCE OF ULCERS  *URINARY PROBLEMS  *BOWEL PROBLEMS  UNUSUAL RASH Items with * indicate a potential emergency and should be followed up as soon as possible.  Feel free to call the clinic you have any questions or concerns. The clinic phone number is [phone].  Please show the CHEMO ALERT CARD at check-in to the Emergency Department and triage nurse.   

## 2015-07-26 NOTE — Telephone Encounter (Signed)
Gave adn printed appt sched and avs fo rpt for NOV °

## 2015-07-27 ENCOUNTER — Encounter (HOSPITAL_COMMUNITY): Payer: Self-pay | Admitting: *Deleted

## 2015-08-09 ENCOUNTER — Ambulatory Visit (HOSPITAL_BASED_OUTPATIENT_CLINIC_OR_DEPARTMENT_OTHER): Payer: Medicare Other | Admitting: Hematology

## 2015-08-09 ENCOUNTER — Encounter: Payer: Self-pay | Admitting: Hematology

## 2015-08-09 ENCOUNTER — Telehealth: Payer: Self-pay | Admitting: Hematology

## 2015-08-09 ENCOUNTER — Other Ambulatory Visit (HOSPITAL_BASED_OUTPATIENT_CLINIC_OR_DEPARTMENT_OTHER): Payer: Medicare Other

## 2015-08-09 ENCOUNTER — Ambulatory Visit (HOSPITAL_BASED_OUTPATIENT_CLINIC_OR_DEPARTMENT_OTHER): Payer: Medicare Other

## 2015-08-09 VITALS — BP 118/72 | HR 75 | Temp 97.8°F | Resp 18 | Ht 65.0 in | Wt 138.8 lb

## 2015-08-09 DIAGNOSIS — C7800 Secondary malignant neoplasm of unspecified lung: Secondary | ICD-10-CM

## 2015-08-09 DIAGNOSIS — Z5112 Encounter for antineoplastic immunotherapy: Secondary | ICD-10-CM

## 2015-08-09 DIAGNOSIS — C7972 Secondary malignant neoplasm of left adrenal gland: Secondary | ICD-10-CM

## 2015-08-09 DIAGNOSIS — C787 Secondary malignant neoplasm of liver and intrahepatic bile duct: Secondary | ICD-10-CM

## 2015-08-09 DIAGNOSIS — C187 Malignant neoplasm of sigmoid colon: Secondary | ICD-10-CM

## 2015-08-09 DIAGNOSIS — E119 Type 2 diabetes mellitus without complications: Secondary | ICD-10-CM

## 2015-08-09 DIAGNOSIS — C189 Malignant neoplasm of colon, unspecified: Secondary | ICD-10-CM

## 2015-08-09 DIAGNOSIS — R21 Rash and other nonspecific skin eruption: Secondary | ICD-10-CM

## 2015-08-09 LAB — CBC WITH DIFFERENTIAL/PLATELET
BASO%: 0.5 % (ref 0.0–2.0)
BASOS ABS: 0 10*3/uL (ref 0.0–0.1)
EOS ABS: 0.2 10*3/uL (ref 0.0–0.5)
EOS%: 4.7 % (ref 0.0–7.0)
HCT: 38.4 % (ref 38.4–49.9)
HGB: 12.8 g/dL — ABNORMAL LOW (ref 13.0–17.1)
LYMPH%: 17.8 % (ref 14.0–49.0)
MCH: 29.6 pg (ref 27.2–33.4)
MCHC: 33.3 g/dL (ref 32.0–36.0)
MCV: 88.9 fL (ref 79.3–98.0)
MONO#: 0.2 10*3/uL (ref 0.1–0.9)
MONO%: 6.4 % (ref 0.0–14.0)
NEUT#: 2.5 10*3/uL (ref 1.5–6.5)
NEUT%: 70.6 % (ref 39.0–75.0)
Platelets: 105 10*3/uL — ABNORMAL LOW (ref 140–400)
RBC: 4.32 10*6/uL (ref 4.20–5.82)
RDW: 15.5 % — AB (ref 11.0–14.6)
WBC: 3.5 10*3/uL — ABNORMAL LOW (ref 4.0–10.3)
lymph#: 0.6 10*3/uL — ABNORMAL LOW (ref 0.9–3.3)

## 2015-08-09 LAB — COMPREHENSIVE METABOLIC PANEL (CC13)
ALT: 58 U/L — ABNORMAL HIGH (ref 0–55)
AST: 77 U/L — AB (ref 5–34)
Albumin: 3.8 g/dL (ref 3.5–5.0)
Alkaline Phosphatase: 241 U/L — ABNORMAL HIGH (ref 40–150)
Anion Gap: 9 mEq/L (ref 3–11)
BILIRUBIN TOTAL: 1.28 mg/dL — AB (ref 0.20–1.20)
BUN: 11.6 mg/dL (ref 7.0–26.0)
CO2: 25 meq/L (ref 22–29)
Calcium: 9.6 mg/dL (ref 8.4–10.4)
Chloride: 107 mEq/L (ref 98–109)
Creatinine: 0.9 mg/dL (ref 0.7–1.3)
EGFR: 88 mL/min/{1.73_m2} — AB (ref >90)
GLUCOSE: 159 mg/dL — AB (ref 70–140)
POTASSIUM: 4.1 meq/L (ref 3.5–5.1)
SODIUM: 141 meq/L (ref 136–145)
Total Protein: 9 g/dL — ABNORMAL HIGH (ref 6.4–8.3)

## 2015-08-09 LAB — CANCER ANTIGEN 19-9: CA 19-9: 49.7 U/mL — ABNORMAL HIGH (ref <35.0)

## 2015-08-09 LAB — CEA: CEA: 96.3 ng/mL — ABNORMAL HIGH (ref 0.0–5.0)

## 2015-08-09 LAB — MAGNESIUM (CC13): MAGNESIUM: 1 mg/dL — AB (ref 1.5–2.5)

## 2015-08-09 MED ORDER — SODIUM CHLORIDE 0.9 % IV SOLN
Freq: Once | INTRAVENOUS | Status: AC
  Administered 2015-08-09: 10:00:00 via INTRAVENOUS

## 2015-08-09 MED ORDER — HEPARIN SOD (PORK) LOCK FLUSH 100 UNIT/ML IV SOLN
500.0000 [IU] | Freq: Once | INTRAVENOUS | Status: AC | PRN
  Administered 2015-08-09: 500 [IU]
  Filled 2015-08-09: qty 5

## 2015-08-09 MED ORDER — SODIUM CHLORIDE 0.9 % IJ SOLN
10.0000 mL | INTRAMUSCULAR | Status: DC | PRN
  Administered 2015-08-09: 10 mL
  Filled 2015-08-09: qty 10

## 2015-08-09 MED ORDER — SODIUM CHLORIDE 0.9 % IV SOLN
6.1000 mg/kg | Freq: Once | INTRAVENOUS | Status: AC
  Administered 2015-08-09: 400 mg via INTRAVENOUS
  Filled 2015-08-09: qty 20

## 2015-08-09 MED ORDER — SODIUM CHLORIDE 0.9 % IV SOLN
4.0000 g | Freq: Once | INTRAVENOUS | Status: AC
  Administered 2015-08-09: 4 g via INTRAVENOUS
  Filled 2015-08-09: qty 8

## 2015-08-09 NOTE — Patient Instructions (Signed)
[place] Discharge Instructions for Patients Receiving Chemotherapy  Today you received the following chemotherapy agents: Vectibix  To help prevent nausea and vomiting after your treatment, we encourage you to take your nausea medication as prescribed by your physician.   If you develop nausea and vomiting that is not controlled by your nausea medication, call the clinic.   BELOW ARE SYMPTOMS THAT SHOULD BE REPORTED IMMEDIATELY:  *FEVER GREATER THAN 100.5 F  *CHILLS WITH OR WITHOUT FEVER  NAUSEA AND VOMITING THAT IS NOT CONTROLLED WITH YOUR NAUSEA MEDICATION  *UNUSUAL SHORTNESS OF BREATH  *UNUSUAL BRUISING OR BLEEDING  TENDERNESS IN MOUTH AND THROAT WITH OR WITHOUT PRESENCE OF ULCERS  *URINARY PROBLEMS  *BOWEL PROBLEMS  UNUSUAL RASH Items with * indicate a potential emergency and should be followed up as soon as possible.  Feel free to call the clinic you have any questions or concerns. The clinic phone number is [phone].  Please show the [place] at check-in to the Emergency Department and triage nurse.  Hypomagnesemia Hypomagnesemia is a condition in which the level of magnesium in the blood is low. Magnesium is a mineral that is found in many foods. It is used in many different processes in the body. Hypomagnesemia can affect every organ in the body. It can cause life-threatening problems. CAUSES Causes of hypomagnesemia include:  Not getting enough magnesium in your diet.  Malnutrition.  Problems with absorbing magnesium from the intestines.  Dehydration.  Alcohol abuse.  Vomiting.  Severe diarrhea.  Some medicines, including medicines that make you urinate more.  Certain diseases, such as kidney disease, diabetes, and overactive thyroid. SIGNS AND SYMPTOMS  Involuntary shaking or trembling of a body part (tremor).  Confusion.  Muscle weakness.  Sensitivity to light, sound, and touch.  Psychiatric issues, such as  depression, irritability, or psychosis.  Sudden tightening of muscles (muscle spasms).  Tingling in the arms and legs.  A feeling of fluttering of the heart. These symptoms are more severe if magnesium levels drop suddenly. DIAGNOSIS To make a diagnosis, your health care provider will do a physical exam and order blood and urine tests. TREATMENT Treatment will depend on the cause and the severity of your condition. It may involve:  A magnesium supplement. This can be taken in pill form. It can also be given through an IV tube. This is usually done if the condition is severe.  Changes to your diet. You may be directed to eat foods that have a lot of magnesium, such as green leafy vegetables, peas, beans, and nuts.  Eliminating alcohol from your diet. HOME CARE INSTRUCTIONS  Include foods with magnesium in your diet. Foods that are rich in magnesium include green vegetables, beans, nuts and seeds, and whole grains.  Take medicines only as directed by your health care provider.  Take magnesium supplements if your health care provider instructs you to do that. Take them as directed.  Have your magnesium levels monitored as directed by your health care provider.  When you are active, drink fluids that contain electrolytes.  Keep all follow-up visits as directed by your health care provider. This is important. SEEK MEDICAL CARE IF:  You get worse instead of better.  Your symptoms return. SEEK IMMEDIATE MEDICAL CARE IF:  Your symptoms are severe.   This information is not intended to replace advice given to you by your health care provider. Make sure you discuss any questions you have with your health care provider.   Document  Released: 07/10/2005 Document Revised: 11/04/2014 Document Reviewed: 05/30/2014 Elsevier Interactive Patient Education [employer].

## 2015-08-09 NOTE — Progress Notes (Signed)
Patient is here with interpreter.  OK to treat today with magnesium at 1.0 VO [name]. And we will give IV magnesium today after treatment.

## 2015-08-09 NOTE — Telephone Encounter (Signed)
per pof to sch pt appt-gave tp copy of avs-sent MW to move infusion to coordinate w/MD appt

## 2015-08-09 NOTE — Progress Notes (Signed)
[place]  [phone] Fax:(336) [phone]  Clinic follow up Note   Patient Care Team: [name], MD as PCP - General (Specialist) [name], CRNP as Nurse Practitioner (Nurse Practitioner) Truitt Merle, MD as Consulting Physician (Hematology) Ladene Artist, MD as Consulting Physician (Gastroenterology) 08/09/2015  CHIEF COMPLAINTS Follow up metastatic sigmoid colon cancer  Oncology History   He is a Metastatic colon cancer to liver   Staging form: Colon and Rectum, AJCC 7th Edition     Clinical: Stage Unknown ([place], NX, M1) - Unsigned      Cancer of sigmoid colon ([place])   10/28/2014 Tumor Marker AFP 3.2 CEA > 10,000 CA 19-9 12,929.6. tumor (-) KRAS and NRAS mutation.    11/10/2014 Imaging PET scan showed hypermetabolic mass in the sigmoid colon was noted metastasis in the retroperitoneum. Probable left adrenal and pulmonary metastasis, and diffuse liver metastasis.   11/21/2014 Initial Diagnosis Metastatic colon cancer to liver, lung, abd nodes and left adrenal gland. Diagnosis was made by liver biopsy.    11/30/2014 -  Chemotherapy First line chemo [id number], Panitumumab added from second cycle    12/28/2014 - 12/31/2014 Hospital Admission Was admitted for dehydration, neutropenia fever with UTI, and severe skin rash.   02/08/2015 - 02/12/2015 Hospital Admission He was admitted for upper GI bleeding, e.g. showed a gastric ulcer with clots, status post appendectomy injection. He also received a blood transfusion.   03/01/2015 Tumor Marker CEA 694, CA19.9 462   03/13/2015 Imaging CT CAP showed partial response, no new lesions.    03/15/2015 - 05/23/2015 Chemotherapy restart FOLFOX, held again on 8/9 due to GI bleeding    05/31/2015 - 06/02/2015 Hospital Admission He was admitted to [facility] in [place] due to upper GI bleeding, EGD showed gastric and dudenal ulcers    06/27/2015 - 06/29/2015 Hospital Admission he was admitted for dairrhea and pancolitis, c-diff and  stool cultures were negative, treated with antibiotics   07/12/2015 -  Chemotherapy panitumumab 39m/kg, every 2 weeks     CURRENT THERAPY: panitumumab 633mkg, every 2 weeks, started on 07/12/2015   HISTORY OF PRESENTING ILLNESS:  [place] [age].o. male is here because of abnormal CT findings, which is very suspicious for malignancy. [place] He came in with his son and an interpreter.  He has been feeling fatigued since two month ago. He is still able to do all ADLs. He otherwise denies any pain, bloating or nausea.  He lost about 20lbs in 3 month. His appetite is lower than before, eats less, no change of his bowl habits.  She denied any hematochezia or melana. Per his son, he has had some personality changes daily, irritable, slightly confused some time.  He was evaluated by his primary care physician. Lab test reviewed hepatitis B infection, which he did not know before, and elevated alkaline phosphatase, his liver function and the rest of the liver function was not remarkable. [place] abdomen was obtained on 07/22/2014, which showed diffusely abnormal liver with multiple echogenic lesions. CT of abdomen with and without contrast was done on 08/26/2014, which reviewed here at a medically with multiple large partially calcified hepatic masses consistent with metastatic disease. Mild retroperitoneal adenopathy with the largest node measuring 1.6 cm. And nonspecific 1.4 cm left adrenal nodule was also noticed. His tumor marker showed CEA greater than 10,000, CA 19-9 12,929, AFP 3.2 (normal). [facility] and was evaluated by nurse practitioner [name]. Treatment for hepatitis B was not recommended based on his virus load.  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 He was evaluated by a cardiologist in 2014. He denies any significant dyspnea on exertion.  No leg swollen.  INTERIM HISTORY: Nestor returns for follow-up and third cycle Vectibix. He tolerated the second cycle better, with less rashes, although he still complains of itchy rash on his back. No other reported new symptoms. No nausea, diarrhea, fever, chills. His appetite and energy level are moderate, he functions well at home. His weight has been stable. He denies any episodes of melena or hematochezia.  MEDICAL HISTORY:  Past Medical History  Diagnosis Date  . Tachycardia   . Abnormal EKG   . Hypertension   . Non-ischemic cardiomyopathy ([place])   . Hepatitis B   . H. pylori infection   . Gastric ulcer   . Metastatic colon cancer to liver ([place])   . Diabetes mellitus without complication ([place])     metformin    SURGICAL HISTORY: Past Surgical History  Procedure Laterality Date  . Nuclear stress test  03/03/2013    High risk - consistent with nonischemic cardiomyopathy  . Left and right heart catheterization with coronary angiogram N/A 03/29/2013    Procedure: LEFT AND RIGHT HEART CATHETERIZATION WITH CORONARY ANGIOGRAM;  Surgeon: Pixie Casino, MD;  Location: [facility] CATH LAB;  Service: Cardiovascular;  Laterality: N/A;  . Esophagogastroduodenoscopy N/A 02/08/2015    Procedure: ESOPHAGOGASTRODUODENOSCOPY (EGD);  Surgeon: Ladene Artist, MD;  Location: [facility] ENDOSCOPY;  Service: Endoscopy;  Laterality: N/A;    SOCIAL HISTORY: Social History   Social History  . Marital Status: Married    Spouse Name: N/A  . Number of Children: N/A  . Years of Education: N/A   Occupational History  . Not on file.   Social History Main Topics  . Smoking status: Former Research scientist (life sciences)  . Smokeless tobacco: Never Used  . Alcohol Use: Yes     Comment: occasional  . Drug Use: No  . Sexual Activity: No   Other Topics Concern  . Not on file   Social History Narrative   Married   Enjoys walking   Has lived in [place] > 16 years    FAMILY HISTORY: No family history of liver disease or  malignancy.  ALLERGIES:  has No Known Allergies.  MEDICATIONS:  Current Outpatient Prescriptions on File Prior to Visit  Medication Sig Dispense Refill  . ciprofloxacin (CIPRO) 500 MG tablet Take 1 tablet (500 mg total) by mouth 2 (two) times daily. 20 tablet 0  . clindamycin (CLINDAGEL) 1 % gel Apply topically 2 (two) times daily. 30 g 2  . folic acid (FOLVITE) 1 MG tablet Take 1 mg by mouth daily.    . hydrocortisone 2.5 % cream Apply topically 2 (two) times daily. 30 g 0  . lidocaine-prilocaine (EMLA) cream Apply 1 application topically as needed. Apply to [facility] cath at least one hour before needle stick. 30 g 2  . lisinopril-hydrochlorothiazide (PRINZIDE,ZESTORETIC) 10-12.5 MG per tablet Take 1 tablet by mouth daily.  3  . metFORMIN (GLUCOPHAGE) 500 MG tablet Take by mouth 2 (two) times daily with a meal.    . metroNIDAZOLE (FLAGYL) 500 MG tablet Take 1 tablet (500 mg total) by mouth every 8 (eight) hours. 30 tablet 0  . nadolol (CORGARD) 20 MG tablet Take 1 tablet (20 mg total) by mouth  2 (two) times daily. 60 tablet 0  . ondansetron (ZOFRAN) 8 MG tablet Take 1 tablet (8 mg total) by mouth every 8 (eight) hours as needed for nausea or vomiting. 45 tablet 0  . pantoprazole (PROTONIX) 40 MG tablet Take 1 tablet (40 mg total) by mouth daily. (Patient taking differently: Take 40 mg by mouth 2 (two) times daily. ) 30 tablet 11  . prochlorperazine (COMPAZINE) 10 MG tablet Take 1 tablet (10 mg total) by mouth every 6 (six) hours as needed for nausea or vomiting. 30 tablet 1   No current facility-administered medications on file prior to visit.  ;   REVIEW OF SYSTEMS:   Constitutional: Denies fevers, chills or abnormal night sweats, (+) fatigue  Eyes: Denies blurriness of vision, double vision or watery eyes Ears, nose, mouth, throat, and face: Denies mucositis or sore throat Respiratory: Denies cough, dyspnea or wheezes Cardiovascular: Denies palpitation, chest discomfort or lower  extremity swelling Gastrointestinal: Denies nausea, heartburn or change in bowel habits Skin: Denies abnormal skin rashes Lymphatics: Denies new lymphadenopathy or easy bruising Neurological:Denies numbness, tingling or new weaknesses Behavioral/Psych: Mood is stable, no new changes, (+) insomnia All other systems were reviewed with the patient and are negative.  PHYSICAL EXAMINATION: BP 118/72 mmHg  Pulse 75  Temp(Src) 97.8 F (36.6 C) (Oral)  Resp 18  Ht _0  (1.651 m)  Wt 138 lb 12.8 oz (62.959 kg)  BMI 23.10 kg/m2  SpO2 100%  ECOG PERFORMANCE STATUS: 1 Vital sign were taken at in the infusion room, within normal limits. GENERAL:alert, no distress and comfortable SKIN: (+) Dry skin, diffuse acne like skin rashes on the face, neck and trunk, no skin ulcer or discharge. EYES: normal, conjunctiva are pink and non-injected, sclera clear OROPHARYNX:no exudate, no erythema and lips, buccal mucosa, and tongue normal  NECK: supple, thyroid normal size, non-tender, without nodularity LYMPH:  no palpable lymphadenopathy in the cervical, axillary or inguinal LUNGS: clear to auscultation and percussion with normal breathing effort, (+) crackles on b/l lung base  HEART: regular rate & rhythm and no murmurs and no lower extremity edema ABDOMEN:abdomen soft, non-tender, no hepatomegaly, no splenomegaly and normal bowel sounds Musculoskeletal:no cyanosis of digits and no clubbing  PSYCH: alert & oriented x 3 with fluent speech NEURO: no focal motor/sensory deficits  LABORATORY DATA:  I have reviewed the data as listed CBC Latest Ref Rng 08/09/2015 07/26/2015 07/12/2015  WBC 4.0 - 10.3 10e3/uL 3.5(L) 4.1 3.8(L)  Hemoglobin 13.0 - 17.1 g/dL 12.8(L) 12.6(L) 12.1(L)  Hematocrit 38.4 - 49.9 % 38.4 38.1(L) 36.9(L)  Platelets 140 - 400 10e3/uL 105(L) 92(L) 131(L)    CMP Latest Ref Rng 08/09/2015 07/26/2015 07/12/2015  Glucose 70 - 140 mg/dl 159(H) 142(H) 211(H)  BUN 7.0 - 26.0 mg/dL 11.6 13.5  12.2  Creatinine 0.7 - 1.3 mg/dL 0.9 1.0 1.0  Sodium 136 - 145 mEq/L 141 135(L) 139  Potassium 3.5 - 5.1 mEq/L 4.1 4.4 4.5  Chloride 101 - 111 mmol/L - - -  CO2 22 - 29 mEq/L 25 28 30(H)  Calcium 8.4 - 10.4 mg/dL 9.6 9.2 9.7  Total Protein 6.4 - 8.3 g/dL 9.0(H) 8.0 8.0  Total Bilirubin 0.20 - 1.20 mg/dL 1.28(H) 0.82 0.70  Alkaline Phos 40 - 150 U/L 241(H) 219(H) 269(H)  AST 5 - 34 U/L 77(H) 43(H) 54(H)  ALT 0 - 55 U/L 58(H) 29 37     INITIAL tumor markers AFP 3.2 CEA > 10,000 CA 19-9 12,929.6  CEA  Status: [facility]  Visible to patient:  Not Released Nextappt: 08/16/2015 at 07:00 AM in Radiology ([facility]-CT 2) Dx:  Metastatic colon cancer to liver ([place])              Ref Range 8:40 AM  552moago  220mogo  74m54moo     CEA 0.0 - 5.0 ng/mL 96.3 (H) 386.8 (H)CM 90.7 (H) 249.5 (H)CM        CA 19.9 (Order [id number]    CA 19.9  Status: Finalresult Visible to patient:  Not Released Nextappt: 08/16/2015 at 07:00 AM in Radiology ([facility]-CT 2) Dx:  Metastatic [place] cancer to liver [place]            Ref Range 8:40 AM  68mo59mo  52mo 55mo 74mo a59mo   CA 19-9 <35.0 U/mL 49.7 (H) 310.6 (H) 94.4 (H) 164.7 (H)          Pathology report  Liver, needle/core biopsy - METASTATIC ADENOCARCINOMA, SEE COMMENT. Microscopic Comment The adenocarcinoma demonstrates the following immunophenotype: Cytokeratin 7 - negative expression. Cytokeratin 20 - strong diffuse expression. CD2 - strong diffuse expression. Overall the morphology and immunophenotype are that of metastatic adenocarcinoma primary to colorectum. The recent nuclear medicine scan demonstrating sigmoid mass with associated liver masses is noted.  Foundation[facility] test result:    RADIOGRAPHIC STUDIES: I have personally reviewed the outside CT scan image with patient and his son.   CT chest, abdomen and pelvis with IV contrast on 05/19/2015. IMPRESSION: Mild residual wall thickening  at the site of known sigmoid colonic adenocarcinoma.  Improving nodal metastases, as described above, including a dominant 11 mm left para-aortic node.  Improving multifocal hepatic metastases, as described above.  Small bilateral adrenal metastases.  Near complete resolution of prior calcified pulmonary metastases.  [facility] [place] [facility] was found in the low cervical esophagus, they will 5 mm in largest diameter.  Mild portal hypertension gets dropsy was found in the gastric fundus One oozing cratered gastric ulcer was seen at the incisura and gastric antrum, 7X10mm 234mbleeding superficial duodenal ulcers with no stigmata of bleeding was found in the duodenal bowel  ASSESSMENT & PLAN:  68 year35[place] past history of hypertension and dilated nonischemic gammopathy with EF 25%, no clinical signs of heart failure, who was found to have hepatitis B infection lately, and multiple liver lesions on the CT scan. He has extremely high CEA and CA 19-9 levels. PET scan reviewed a hypermetabolic sigmoid colon mass, diffuse liver metastasis, probable lung and adrenal gland metastasis.  1. Metastatic sigmoid colon cancer, with diffuse liver, lungs, node and left adrenal gland metastases. KRAS/NRAS wild type, MSI-stable -Liver biopsy showed metastatic adenocarcinoma. His tumor were strongly positive for CK20 and CD2, consistent with primary colorectal primary. KRAS and NRAS mutations were not detected.  -Pt understands that this is incurable cancer, and he has very high disease burden and overall prognosis is poor. The treatment goal is palliative -His bilirubinemia has improved after first cycle chemotherapy.  -Giving the wild-type KRAS/NRAS genotype, he would benefit from EGFR targeted therapy. He initially started panitumumab from second cycle chemo. He had G2-3 skin rash with neutropenic fever, and was admitted for GI bleeding after second dose  panitumumab. We restarted it again.  -Avastin contraindicated due to his gastric ulcer and bleeding in April 2016 and Aug 2016  -I discussed his restaging CT scan from 7/22, which showed continue response. No new lesions. He is  doing well and his tumor marker has come down dramatically, supporting good clinical response.  -Due to the second episode of upper GI bleeding and ulcers, I'll hold his chemotherapy, until his ulcers are healed  -Due to the possible relationship of chemotherapy and upper GI ulcer in the bleeding, I probably will change his chemotherapy treatment to [place] when his ulcers heal well.  -His Foundation one can only test showed EGFR amplification, no KRAS/NRAS/BRAF mutations. -I have restarted panitumumab, he tolerated well except grade 1 skin rash. We'll continue it every 2 weeks for now. I'll add on chemotherapy irrinotecan if his ulcers well healed on his repeated endoscopy, which is scheduled for 10/18  -his tumor markers CEA and CA19.9 have dropped again since he restarted panitumumab, which is encouraging for response -his liver function is slightly worse than before, ? Disease progression vs treatment related  -will restaging in 2 weeks   2. Grade 1 skin rashes,  -Secondary to panitumumab  -continue hydrocortisone 2.5%, and clindamycin gel 1%  twice daily -He knows to avoid sun exposure, and call me if it gets worse.  3. Gastric ulcer with significant GI bleeding in April and Aug 2016 -He is on PPI, continue twice daily  -He will follow-up with Dr. Fuller Plan and repeated EGD in Oct  -continue Nadolol 57m bid, per Dr. SFuller Plan 4. Poorly controlled diabetes -HbA1c was 8.5, blood glucose not well controlled  -We again discussed diet and then close monitoring sugar at home. -Continue medication, follow up with primary care physician.  5. HTN, Dilated nonischemic ischemia cardiomyopathy with EF 25% -He is clinically doing well without symptoms of CHF. However this is  probably going to impact his chemotherapy. I'll try to avoid cardiotoxic chemotherapy agent and avoid fluid overload during chemotherapy. -Continue follow-up with cardiology.  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 Follow-up with liver clinic.  7. Malnutrition -I encouraged him to eat more, and take supplements as needed. -follow up with Dietitian   8. Anemia secondary to GI bleeding and iron deficiency  -Repeat lab on 06/06/2015 showed ferritin 117, serum iron 24, saturation 8%, which supports iron deficiency -He received IV Feraheme again in Aug 2016 after GI bleeding  -Hb 12.8 today, stable  9. Mild abnormal LFTs -? Secondary to panitumumab vs disease progression -will monitor closely    Plan -third dose panitumumab today, and continue every 2 weeks -I will see him back in 2 weeks with a restaging CT CAP -possible add Irinotecan if EGD normal   All questions were answered. The patient knows to call the clinic with any problems, questions or concerns.  I spent 25 minutes counseling the patient face to face. The total time spent in the appointment was 30 minutes and more than 50% was on counseling.     FTruitt Merle MD 08/09/2015   10:37 PM

## 2015-08-10 ENCOUNTER — Telehealth: Payer: Self-pay | Admitting: *Deleted

## 2015-08-10 ENCOUNTER — Telehealth: Payer: Self-pay | Admitting: Hematology

## 2015-08-10 NOTE — Telephone Encounter (Signed)
Per staff message and POF I have scheduled appts. Advised scheduler of appts. JMW  

## 2015-08-10 NOTE — Telephone Encounter (Signed)
per pof to sch pt appt-cld pt and left message of time & date of fera appt-adv to be here @9  ;00 am on 10/26

## 2015-08-10 NOTE — Telephone Encounter (Signed)
Advised scheduler first available appt given on 10/26

## 2015-08-15 ENCOUNTER — Ambulatory Visit (HOSPITAL_COMMUNITY): Payer: Medicare Other | Admitting: Anesthesiology

## 2015-08-15 ENCOUNTER — Encounter (HOSPITAL_COMMUNITY)
Admission: RE | Disposition: A | Discharge status: Home / Self Care | Payer: Self-pay | Source: Ambulatory Visit | Attending: Gastroenterology

## 2015-08-15 ENCOUNTER — Ambulatory Visit (HOSPITAL_COMMUNITY)
Admission: RE | Admit: 2015-08-15 | Discharge: 2015-08-15 | Disposition: A | Discharge status: Home / Self Care | Payer: Medicare Other | Source: Ambulatory Visit | Attending: Gastroenterology | Admitting: Gastroenterology

## 2015-08-15 ENCOUNTER — Encounter (HOSPITAL_COMMUNITY): Payer: Self-pay

## 2015-08-15 DIAGNOSIS — D62 Acute posthemorrhagic anemia: Secondary | ICD-10-CM | POA: Diagnosis not present

## 2015-08-15 DIAGNOSIS — I8500 Esophageal varices without bleeding: Secondary | ICD-10-CM | POA: Diagnosis not present

## 2015-08-15 DIAGNOSIS — I8510 Secondary esophageal varices without bleeding: Secondary | ICD-10-CM | POA: Diagnosis not present

## 2015-08-15 DIAGNOSIS — K319 Disease of stomach and duodenum, unspecified: Secondary | ICD-10-CM | POA: Insufficient documentation

## 2015-08-15 DIAGNOSIS — C787 Secondary malignant neoplasm of liver and intrahepatic bile duct: Secondary | ICD-10-CM | POA: Diagnosis not present

## 2015-08-15 DIAGNOSIS — K254 Chronic or unspecified gastric ulcer with hemorrhage: Secondary | ICD-10-CM | POA: Insufficient documentation

## 2015-08-15 DIAGNOSIS — Z9221 Personal history of antineoplastic chemotherapy: Secondary | ICD-10-CM | POA: Insufficient documentation

## 2015-08-15 DIAGNOSIS — K259 Gastric ulcer, unspecified as acute or chronic, without hemorrhage or perforation: Secondary | ICD-10-CM | POA: Insufficient documentation

## 2015-08-15 DIAGNOSIS — C189 Malignant neoplasm of colon, unspecified: Secondary | ICD-10-CM | POA: Diagnosis not present

## 2015-08-15 DIAGNOSIS — K2950 Unspecified chronic gastritis without bleeding: Secondary | ICD-10-CM | POA: Diagnosis not present

## 2015-08-15 DIAGNOSIS — K250 Acute gastric ulcer with hemorrhage: Secondary | ICD-10-CM

## 2015-08-15 DIAGNOSIS — I429 Cardiomyopathy, unspecified: Secondary | ICD-10-CM | POA: Diagnosis not present

## 2015-08-15 DIAGNOSIS — B181 Chronic viral hepatitis B without delta-agent: Secondary | ICD-10-CM | POA: Insufficient documentation

## 2015-08-15 DIAGNOSIS — K269 Duodenal ulcer, unspecified as acute or chronic, without hemorrhage or perforation: Secondary | ICD-10-CM

## 2015-08-15 HISTORY — PX: ESOPHAGOGASTRODUODENOSCOPY (EGD) WITH PROPOFOL: SHX5813

## 2015-08-15 LAB — GLUCOSE, CAPILLARY: Glucose-Capillary: 98 mg/dL (ref 65–99)

## 2015-08-15 SURGERY — ESOPHAGOGASTRODUODENOSCOPY (EGD) WITH PROPOFOL
Anesthesia: Monitor Anesthesia Care

## 2015-08-15 MED ORDER — SODIUM CHLORIDE 0.9 % IV SOLN: INTRAVENOUS | Status: DC

## 2015-08-15 MED ORDER — PROPOFOL 10 MG/ML IV BOLUS
INTRAVENOUS | Status: AC
  Filled 2015-08-15: qty 20

## 2015-08-15 MED ORDER — PROPOFOL 500 MG/50ML IV EMUL
INTRAVENOUS | Status: DC | PRN
  Administered 2015-08-15: 125 ug/kg/min via INTRAVENOUS

## 2015-08-15 MED ORDER — LACTATED RINGERS IV SOLN
INTRAVENOUS | Status: DC
  Administered 2015-08-15: 1000 mL via INTRAVENOUS

## 2015-08-15 MED ORDER — PROPOFOL 10 MG/ML IV BOLUS
INTRAVENOUS | Status: DC | PRN
  Administered 2015-08-15: 50 mg via INTRAVENOUS
  Administered 2015-08-15 (×2): 20 mg via INTRAVENOUS

## 2015-08-15 SURGICAL SUPPLY — 14 items

## 2015-08-15 NOTE — H&P (Signed)
History of Present Illness: This is a 69 year old Jason Reilly male accompanied by his son and a Optometrist. He has metastatic colon cancer to the liver and has undergone chemotherapy. He has a nonischemic cardiomyopathy with 25% ejection fraction, hepatitis B with presumed cirrhosis and a history of a gastric ulcer with bleeding in April 2016. He was hospitalized in early August in [place], [place] for an upper GI bleed. EGD revealed 2 nonbleeding duodenal ulcers 1 gastric ulcer with stigmata of recent bleeding treated with BiCAP, portal gastropathy and grade 2 esophageal varices. He relates no recurrent bleeding and has felt better over the past 2 weeks. Hemoglobin 3 weeks ago was 6.7 and was 9.6 yesterday.  Current Medications, Allergies, Past Medical History, Past Surgical History, Family History and Social History were reviewed in [employer] record.  Physical Exam: General: Well developed , well nourished, no acute distress Head: Normocephalic and atraumatic Eyes: sclerae anicteric, EOMI Ears: Normal auditory acuity Mouth: No deformity or lesions Lungs: Clear throughout to auscultation Heart: Regular rate and rhythm; no murmurs, rubs or bruits Abdomen: Soft, non tender and non distended. No masses, hepatosplenomegaly or hernias noted. Normal Bowel sounds Musculoskeletal: Symmetrical with no gross deformities  Pulses: Normal pulses noted Extremities: No clubbing, cyanosis, edema or deformities noted Neurological: Alert oriented x 4, grossly nonfocal Psychological: Alert and cooperative. Normal mood and affect  Assessment and Recommendations:  1. Gastric ulcer with bleed. Duodenal ulcers without bleeding. Protonix 40 mg daily long term. Avoid ASA/NSAIDs long term. EGD to document ulcer healing in October. The risks (including bleeding, perforation, infection, missed lesions, medication reactions and possible hospitalization or surgery if complications occur),  benefits, and alternatives to endoscopy with possible biopsy and possible dilation were discussed with the patient and they consent to proceed.   2. Esophageal varices, nonbleeding. 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 Portal gastropathy. Continue Nadolol 20 mg bid.   3. Anemia secondary to acute blood loss. Per oncology.   4. Metastatic colon cancer to the liver. Per oncology.   5. Cardiomyopathy with ejection fraction 25%.

## 2015-08-15 NOTE — Op Note (Signed)
[facility] [place], [address]   ENDOSCOPY PROCEDURE REPORT  PATIENT: Jason Reilly, Jason Reilly  MR#: [id number] BIRTHDATE: [birth year] , 43  yrs. old GENDER: male ENDOSCOPIST: Ladene Artist, MD, [facility] PROCEDURE DATE:  08/15/2015 PROCEDURE:  EGD w/ biopsy ASA CLASS:     Class III INDICATIONS:  Follow up gastric ulcer. MEDICATIONS: Monitored anesthesia care and Per Anesthesia TOPICAL ANESTHETIC: none DESCRIPTION OF PROCEDURE: After the risks benefits and alternatives of the procedure were thoroughly explained, informed consent was obtained.  The [place] [id number] endoscope was introduced through the mouth and advanced to the second portion of the duodenum , Without limitations.  The instrument was slowly withdrawn as the mucosa was fully examined.    ESOPHAGUS: There were 3 columns of medium sized varices in the mid esophagus and distal esophagus without stigmata of recent bleeding. The esophagus was otherwise normal appearing. STOMACH: A single healing, non-bleeding, clean-based and round ulcer measuring 2 x 2 mm in size was found at the incisura. The ulcer was 95% healed.  Biopsies were taken around the ulcer and at edge of the ulcer.  Moderate erosive gastritis was found in the gastric body. Several areas of patchy erythema in the antrum  c/w portal gastropathy.  The stomach otherwise appeared normal.   No gastric varices noted. DUODENUM: The duodenal mucosa showed no abnormalities in the bulb and 2nd part of the duodenum.  Retroflexed views revealed no abnormalities.     The scope was then withdrawn from the patient and the procedure completed.  COMPLICATIONS: There were no immediate complications.  ENDOSCOPIC IMPRESSION: 1.   Single healing gastric ulcer at the incisura; biopsies were taken 2.   Erosive gastritis in the gastric body 3.   Probable portal gastropathy in the gastric antrum 3.   Esophageal varices, 3 columns, medium  sized  RECOMMENDATIONS: 1.  Await pathology results 2.  Continue PPI qam long term 3.  Avoid ASA/NSAIDS long term  eSigned:  Ladene Artist, MD, [facility] 08/15/2015 9:52 AM

## 2015-08-15 NOTE — Transfer of Care (Signed)
Immediate Anesthesia Transfer of Care Note  Patient: Jason Reilly  Procedure(s) Performed: Procedure(s): ESOPHAGOGASTRODUODENOSCOPY (EGD) WITH PROPOFOL (N/A)  Patient Location: PACU  Anesthesia Type:MAC  Level of Consciousness:  sedated, patient cooperative and responds to stimulation  Airway & Oxygen Therapy:Patient Spontanous Breathing and Patient connected to face mask oxgen  Post-op Assessment:  Report given to PACU RN and Post -op Vital signs reviewed and stable  Post vital signs:  Reviewed and stable  Last Vitals:  Filed Vitals:   08/15/15 0838  BP: 162/65  Pulse: 64  Temp: 36.7 C  Resp: 20    Complications: No apparent anesthesia complications

## 2015-08-15 NOTE — Discharge Instructions (Addendum)
Soi ru?t k?t, Ch?m [place] sau khi lm th? thu?t (Colonoscopy, Care After) Tham kh?o t? thng tin ny trong vi tu?n t?i. Nh?ng h??ng d?n ny cung c?p cho qu v? thng tin v? cch ch?m [place] b?n thn sau khi lm th? thu?t. Chuyn gia ch?m [place] s?c kh?e c?ng c th? h??ng d?n c? th? h?n cho qu v?. Vi?c ?i?u tr? c?a qu v? ? ???c ln k? ho?ch theo th?c hnh y khoa hi?n t?i, nh?ng v?n ?? ?i khi v?n x?y ra. Hy g?i cho chuyn gia ch?m [place] s?c kh?e n?u qu v? c b?t k? v?n ?? ho?c th?c m?c no sau khi lm th? thu?t. K? V?NG ?I?U G SAU TH? THU?T  Sau khi lm th? thu?t th??ng c nh?ng v?n ?? sau:  M?t l??ng mu nh? trong phn.  ?nh h?i m?t cht v b? ?au th?t ho?c ch??ng b?ng nh?. H??NG D?N CH?M [place] T?I NH  Khng li xe, v?n hnh my mc, ho?c k cc gi?y t? quan tr?ng trong 24 gi?Sander Nephew v? c th? t?m vi hoa sen ho?c tr? l?i cc ho?t ??ng th? ch?t thng th??ng, nh?ng c? ??ng v?i m?t t?c ?? ch?m h?n trong 24 gi? ??u tin.  Th??ng xuyn ngh? ng?i trong 24 gi? ??u tin.  ?i d?o xung quanh v ??t m?t ti ?m ln b?ng ?? gip gi?m ?au co th?t ho?c ch??ng b?ng.  U?ng ?? n??c ?? gi? cho n??c ti?u trong ho?c vng nh?t.  Qu v? c th? tr? l?i v?i ch? ?? ?n u?ng bnh th??ng theo ch? d?n c?a chuyn gia ch?m [place] s?c kh?e. Trnh cc mn ?n n?ng ho?c chin rn kh tiu ha.  Trnh u?ng r??u trong 24 gi? ho?c theo h??ng d?n c?a chuyn gia ch?m [place] s?c kh?e.  Ch? s? d?ng thu?c khng c?n k ??n ho?c thu?c c?n k ??n theo ch? d?n c?a chuyn gia ch?m [place] s?c kh?e.  N?u ph?i l?y m?t m?u m (sinh thi?t) trong lc lm th? thu?t:  Khng dng aspirin ho?c thu?c lm long mu trong vng 7 ngy, ho?c theo ch? d?n c?a chuyn gia ch?m [place] s?c kh?e.  Khng u?ng r??u trong 7 ngy, ho?c theo h??ng d?n c?a chuyn gia ch?m [place] s?c kh?e.  ?n th?c ?n m?m trong 24 gi? ??u. ?I KHM N?U: Qu v? b? r?m mu lin t?c trong phn 2 - 3 ngy sau khi lm th? thu?t. NGAY L?P T?C ?I KHM N?U:  Qu v? nhi?u l?n c r?m mu trong  phn.  ?i ??i ti?n c c?c mu ?ng trong phn.  B?ng qu v? ph?ng ln (ch??ng c?ng).  Qu v? b? bu?n nn ho?c nn m?a.  Qu v? b? s?t.  Qu v? b? ?au b?ng t?ng ln, khng thuyn gi?m sau khi dng thu?c.   Thng tin ny khng nh?m m?c ?ch thay th? cho l?i khuyn m chuyn gia ch?m [place] s?c kh?e ni v?i qu v?. Hy b?o ??m qu v? ph?i th?o lu?n b?t k? v?n ?? g m qu v? c v?i chuyn gia ch?m [place] s?c kh?e c?a qu v?.   Document Released: 08/04/2013 Elsevier Interactive Patient Education [employer]. Colonoscopy, Care After These instructions give you information on caring for yourself after your procedure. Your doctor may also give you more specific instructions. Call your doctor if you have any problems or questions after your procedure. HOME CARE  Do not drive for 24 hours.  Do not sign important papers or use machinery  for 24 hours.  You may shower.  You may go back to your usual activities, but go slower for the first 24 hours.  Take rest breaks often during the first 24 hours.  Walk around or use warm packs on your belly (abdomen) if you have belly cramping or gas.  Drink enough fluids to keep your pee (urine) clear or pale yellow.  Resume your normal diet. Avoid heavy or fried foods.  Avoid drinking alcohol for 24 hours or as told by your doctor.  Only take medicines as told by your doctor. If a tissue sample (biopsy) was taken during the procedure:   Do not take aspirin or blood thinners for 7 days, or as told by your doctor.  Do not drink alcohol for 7 days, or as told by your doctor.  Eat soft foods for the first 24 hours. GET HELP IF: You still have a small amount of blood in your poop (stool) 2-3 days after the procedure. GET HELP RIGHT AWAY IF:  You have more than a small amount of blood in your poop.  You see clumps of tissue (blood clots) in your poop.  Your belly is puffy (swollen).  You feel sick to your stomach (nauseous) or throw up  (vomit).  You have a fever.  You have belly pain that gets worse and medicine does not help. MAKE SURE YOU:  Understand these instructions.  Will watch your condition.  Will get help right away if you are not doing well or get worse.   This information is not intended to replace advice given to you by your health care provider. Make sure you discuss any questions you have with your health care provider.   Document Released: 11/16/2010 Document Revised: 10/19/2013 Document Reviewed: 06/21/2013 Elsevier Interactive Patient Education 2016 [place]. Esophagogastroduodenoscopy, Care After Refer to this sheet in the next few weeks. These instructions provide you with information about caring for yourself after your procedure. Your health care provider may also give you more specific instructions. Your treatment has been planned according to current medical practices, but problems sometimes occur. Call your health care provider if you have any problems or questions after your procedure. WHAT TO EXPECT AFTER THE PROCEDURE After your procedure, it is typical to feel:  Soreness in your throat.  Pain with swallowing.  Sick to your stomach (nauseous).  Bloated.  Dizzy.  Fatigued. HOME CARE INSTRUCTIONS  Do not eat or drink anything until the numbing medicine (local anesthetic) has worn off and your gag reflex has returned. You will know that the local anesthetic has worn off when you can swallow comfortably.  Do not drive or operate machinery until directed by your health care provider.  Take medicines only as directed by your health care provider. SEEK MEDICAL CARE IF:   You cannot stop coughing.  You are not urinating at all or less than usual. SEEK IMMEDIATE MEDICAL CARE IF:  You have difficulty swallowing.  You cannot eat or drink.  You have worsening throat or chest pain.  You have dizziness or lightheadedness or you faint.  You have nausea or vomiting.  You  have chills.  You have a fever.  You have severe abdominal pain.  You have black, tarry, or bloody stools.   This information is not intended to replace advice given to you by your health care provider. Make sure you discuss any questions you have with your health care provider.   Document Released: 09/30/2012 Document Revised: 11/04/2014 Document Reviewed:  09/30/2012 Elsevier Interactive Patient Education [employer].

## 2015-08-15 NOTE — Anesthesia Postprocedure Evaluation (Signed)
Anesthesia Post Note  Patient: Jason Reilly  Procedure(s) Performed: Procedure(s) (LRB): ESOPHAGOGASTRODUODENOSCOPY (EGD) WITH PROPOFOL (N/A)  Anesthesia type: MAC  Patient location: PACU  Post pain: Pain level controlled  Post assessment: Patient's Cardiovascular Status Stable  Last Vitals:  Filed Vitals:   08/15/15 0950  BP:   Pulse: 66  Temp: 36.4 C  Resp: 16    Post vital signs: Reviewed and stable  Level of consciousness: sedated  Complications: No apparent anesthesia complications

## 2015-08-15 NOTE — Anesthesia Preprocedure Evaluation (Addendum)
Anesthesia Evaluation  Patient identified by MRN, date of birth, ID band Patient awake    Reviewed: Allergy & Precautions, NPO status , Patient's Chart, lab work & pertinent test results  Airway Mallampati: I  TM Distance: >3 FB Neck ROM: Full    Dental   Pulmonary former smoker,    Pulmonary exam normal        Cardiovascular hypertension, Pt. on medications Normal cardiovascular exam  Cardiomyopathy  -  ECHO 3/16 Study Conclusions  - Left ventricle: The cavity size was mildly dilated. Systolic function was mildly to moderately reduced. The estimated ejection fraction was in the range of 40% to 45%. There is mild hypokinesis of the entire myocardium. Doppler parameters are consistent with abnormal left ventricular relaxation (grade 1 diastolic dysfunction). - Aortic valve: A bicuspid morphology cannot be excluded; normal thickness leaflets. - Left atrium: The atrium was mildly dilated. - Right ventricle: Systolic function was mildly reduced.    Neuro/Psych    GI/Hepatic (+) Hepatitis -, BMetastatic Colon Ca   Endo/Other  diabetes, Type 2  Renal/GU      Musculoskeletal   Abdominal   Peds  Hematology   Anesthesia Other Findings   Reproductive/Obstetrics                            Anesthesia Physical Anesthesia Plan  ASA: II  Anesthesia Plan: MAC   Post-op Pain Management:    Induction: Intravenous  Airway Management Planned: Natural Airway  Additional Equipment:   Intra-op Plan:   Post-operative Plan:   Informed Consent: I have reviewed the patients History and Physical, chart, labs and discussed the procedure including the risks, benefits and alternatives for the proposed anesthesia with the patient or authorized representative who has indicated his/her understanding and acceptance.     Plan Discussed with: CRNA and Surgeon  Anesthesia Plan Comments:          Anesthesia Quick Evaluation

## 2015-08-16 ENCOUNTER — Encounter (HOSPITAL_COMMUNITY): Payer: Self-pay

## 2015-08-16 ENCOUNTER — Ambulatory Visit (HOSPITAL_COMMUNITY)
Admission: RE | Admit: 2015-08-16 | Discharge: 2015-08-16 | Disposition: A | Discharge status: Home / Self Care | Payer: Medicare Other | Source: Ambulatory Visit | Attending: Hematology | Admitting: Hematology

## 2015-08-16 ENCOUNTER — Encounter: Payer: Self-pay | Admitting: Gastroenterology

## 2015-08-16 DIAGNOSIS — E278 Other specified disorders of adrenal gland: Secondary | ICD-10-CM | POA: Insufficient documentation

## 2015-08-16 DIAGNOSIS — C787 Secondary malignant neoplasm of liver and intrahepatic bile duct: Secondary | ICD-10-CM | POA: Diagnosis present

## 2015-08-16 DIAGNOSIS — K639 Disease of intestine, unspecified: Secondary | ICD-10-CM | POA: Insufficient documentation

## 2015-08-16 DIAGNOSIS — Z08 Encounter for follow-up examination after completed treatment for malignant neoplasm: Secondary | ICD-10-CM | POA: Insufficient documentation

## 2015-08-16 DIAGNOSIS — C189 Malignant neoplasm of colon, unspecified: Secondary | ICD-10-CM | POA: Insufficient documentation

## 2015-08-16 MED ORDER — IOHEXOL 300 MG/ML  SOLN
100.0000 mL | Freq: Once | INTRAMUSCULAR | Status: AC | PRN
  Administered 2015-08-16: 100 mL via INTRAVENOUS

## 2015-08-22 ENCOUNTER — Other Ambulatory Visit: Payer: Self-pay | Admitting: *Deleted

## 2015-08-22 DIAGNOSIS — C187 Malignant neoplasm of sigmoid colon: Secondary | ICD-10-CM

## 2015-08-23 ENCOUNTER — Ambulatory Visit (HOSPITAL_BASED_OUTPATIENT_CLINIC_OR_DEPARTMENT_OTHER): Payer: Medicare Other

## 2015-08-23 ENCOUNTER — Other Ambulatory Visit (HOSPITAL_BASED_OUTPATIENT_CLINIC_OR_DEPARTMENT_OTHER): Payer: Medicare Other

## 2015-08-23 ENCOUNTER — Ambulatory Visit (HOSPITAL_BASED_OUTPATIENT_CLINIC_OR_DEPARTMENT_OTHER): Payer: Medicare Other | Admitting: Nurse Practitioner

## 2015-08-23 ENCOUNTER — Ambulatory Visit: Payer: Medicare Other

## 2015-08-23 ENCOUNTER — Encounter: Payer: Self-pay | Admitting: Nurse Practitioner

## 2015-08-23 ENCOUNTER — Other Ambulatory Visit: Payer: Medicare Other

## 2015-08-23 VITALS — BP 118/66 | HR 87 | Temp 97.6°F | Resp 18 | Ht 65.0 in | Wt 140.4 lb

## 2015-08-23 DIAGNOSIS — C772 Secondary and unspecified malignant neoplasm of intra-abdominal lymph nodes: Secondary | ICD-10-CM | POA: Diagnosis not present

## 2015-08-23 DIAGNOSIS — C7972 Secondary malignant neoplasm of left adrenal gland: Secondary | ICD-10-CM

## 2015-08-23 DIAGNOSIS — Z5112 Encounter for antineoplastic immunotherapy: Secondary | ICD-10-CM | POA: Diagnosis present

## 2015-08-23 DIAGNOSIS — E119 Type 2 diabetes mellitus without complications: Secondary | ICD-10-CM | POA: Diagnosis not present

## 2015-08-23 DIAGNOSIS — L270 Generalized skin eruption due to drugs and medicaments taken internally: Secondary | ICD-10-CM | POA: Diagnosis not present

## 2015-08-23 DIAGNOSIS — C187 Malignant neoplasm of sigmoid colon: Secondary | ICD-10-CM

## 2015-08-23 DIAGNOSIS — C787 Secondary malignant neoplasm of liver and intrahepatic bile duct: Secondary | ICD-10-CM

## 2015-08-23 DIAGNOSIS — K259 Gastric ulcer, unspecified as acute or chronic, without hemorrhage or perforation: Secondary | ICD-10-CM | POA: Diagnosis not present

## 2015-08-23 DIAGNOSIS — C7800 Secondary malignant neoplasm of unspecified lung: Secondary | ICD-10-CM

## 2015-08-23 LAB — COMPREHENSIVE METABOLIC PANEL (CC13)
ALT: 48 U/L (ref 0–55)
AST: 60 U/L — AB (ref 5–34)
Albumin: 3.6 g/dL (ref 3.5–5.0)
Alkaline Phosphatase: 219 U/L — ABNORMAL HIGH (ref 40–150)
Anion Gap: 6 mEq/L (ref 3–11)
BUN: 17.8 mg/dL (ref 7.0–26.0)
CHLORIDE: 107 meq/L (ref 98–109)
CO2: 27 mEq/L (ref 22–29)
Calcium: 8.9 mg/dL (ref 8.4–10.4)
Creatinine: 0.9 mg/dL (ref 0.7–1.3)
EGFR: >90 mL/min/{1.73_m2} (ref >90)
Glucose: 112 mg/dl (ref 70–140)
POTASSIUM: 3.9 meq/L (ref 3.5–5.1)
SODIUM: 140 meq/L (ref 136–145)
Total Bilirubin: 0.86 mg/dL (ref 0.20–1.20)
Total Protein: 8.3 g/dL (ref 6.4–8.3)

## 2015-08-23 LAB — CBC WITH DIFFERENTIAL/PLATELET
BASO%: 0.6 % (ref 0.0–2.0)
BASOS ABS: 0 10*3/uL (ref 0.0–0.1)
EOS%: 4.7 % (ref 0.0–7.0)
Eosinophils Absolute: 0.2 10*3/uL (ref 0.0–0.5)
HCT: 37.3 % — ABNORMAL LOW (ref 38.4–49.9)
HGB: 12.3 g/dL — ABNORMAL LOW (ref 13.0–17.1)
LYMPH%: 19.4 % (ref 14.0–49.0)
MCH: 29.3 pg (ref 27.2–33.4)
MCHC: 33.1 g/dL (ref 32.0–36.0)
MCV: 88.7 fL (ref 79.3–98.0)
MONO#: 0.3 10*3/uL (ref 0.1–0.9)
MONO%: 7.5 % (ref 0.0–14.0)
NEUT#: 2.9 10*3/uL (ref 1.5–6.5)
NEUT%: 67.8 % (ref 39.0–75.0)
Platelets: 98 10*3/uL — ABNORMAL LOW (ref 140–400)
RBC: 4.21 10*6/uL (ref 4.20–5.82)
RDW: 15.8 % — ABNORMAL HIGH (ref 11.0–14.6)
WBC: 4.2 10*3/uL (ref 4.0–10.3)
lymph#: 0.8 10*3/uL — ABNORMAL LOW (ref 0.9–3.3)

## 2015-08-23 LAB — MAGNESIUM (CC13): MAGNESIUM: 0.8 mg/dL — AB (ref 1.5–2.5)

## 2015-08-23 MED ORDER — MINOCYCLINE HCL 100 MG PO CAPS: 100.0000 mg | ORAL_CAPSULE | Freq: Two times a day (BID) | ORAL | Status: DC

## 2015-08-23 MED ORDER — SODIUM CHLORIDE 0.9 % IV SOLN
6.0000 g | Freq: Once | INTRAVENOUS | Status: AC
  Administered 2015-08-23: 6 g via INTRAVENOUS
  Filled 2015-08-23: qty 12

## 2015-08-23 MED ORDER — MAGNESIUM OXIDE 400 (241.3 MG) MG PO TABS: 400.0000 mg | ORAL_TABLET | Freq: Two times a day (BID) | ORAL | Status: DC

## 2015-08-23 MED ORDER — SODIUM CHLORIDE 0.9 % IV SOLN
Freq: Once | INTRAVENOUS | Status: AC
  Administered 2015-08-23: 13:00:00 via INTRAVENOUS

## 2015-08-23 MED ORDER — SODIUM CHLORIDE 0.9 % IV SOLN: 6.0000 mg/kg | Freq: Once | INTRAVENOUS | Status: DC

## 2015-08-23 MED ORDER — PANITUMUMAB CHEMO INJECTION 100 MG/5ML
6.2000 mg/kg | Freq: Once | INTRAVENOUS | Status: AC
  Administered 2015-08-23: 400 mg via INTRAVENOUS
  Filled 2015-08-23: qty 20

## 2015-08-23 MED ORDER — SODIUM CHLORIDE 0.9 % IJ SOLN
10.0000 mL | INTRAMUSCULAR | Status: DC | PRN
  Administered 2015-08-23: 10 mL via INTRAVENOUS
  Filled 2015-08-23: qty 10

## 2015-08-23 MED ORDER — HEPARIN SOD (PORK) LOCK FLUSH 100 UNIT/ML IV SOLN
500.0000 [IU] | Freq: Once | INTRAVENOUS | Status: AC
  Administered 2015-08-23: 500 [IU] via INTRAVENOUS
  Filled 2015-08-23: qty 5

## 2015-08-23 NOTE — Progress Notes (Signed)
Per Ned Card NP, okay to tx pt w/ platelets 98

## 2015-08-23 NOTE — Patient Instructions (Signed)
[facility] Discharge Instructions for Patients Receiving Chemotherapy  Today you received the following chemotherapy agents vectibix  To help prevent nausea and vomiting after your treatment, we encourage you to take your nausea medication   If you develop nausea and vomiting that is not controlled by your nausea medication, call the clinic.   BELOW ARE SYMPTOMS THAT SHOULD BE REPORTED IMMEDIATELY:  *FEVER GREATER THAN 100.5 F  *CHILLS WITH OR WITHOUT FEVER  NAUSEA AND VOMITING THAT IS NOT CONTROLLED WITH YOUR NAUSEA MEDICATION  *UNUSUAL SHORTNESS OF BREATH  *UNUSUAL BRUISING OR BLEEDING  TENDERNESS IN MOUTH AND THROAT WITH OR WITHOUT PRESENCE OF ULCERS  *URINARY PROBLEMS  *BOWEL PROBLEMS  UNUSUAL RASH Items with * indicate a potential emergency and should be followed up as soon as possible.  Feel free to call the clinic you have any questions or concerns. The clinic phone number is [phone].  Please show the CHEMO ALERT CARD at check-in to the Emergency Department and triage nurse.   

## 2015-08-23 NOTE — Progress Notes (Addendum)
[place] OFFICE PROGRESS NOTE   Diagnosis:  Colon cancer Oncology History   Metastatic colon cancer to liver  Staging form: Colon and Rectum, AJCC 7th Edition  Clinical: Stage Unknown (TX, NX, M1) - Unsigned      Cancer of sigmoid colon ([place])   10/28/2014 Tumor Marker AFP 3.2 CEA > 10,000 CA 19-9 12,929.6. tumor (-) KRAS and NRAS mutation.    11/10/2014 Imaging PET scan showed hypermetabolic mass in the sigmoid colon was noted metastasis in the retroperitoneum. Probable left adrenal and pulmonary metastasis, and diffuse liver metastasis.   11/21/2014 Initial Diagnosis Metastatic colon cancer to liver, lung, abd nodes and left adrenal gland. Diagnosis was made by liver biopsy.    11/30/2014 -  Chemotherapy First line chemo [id number], Panitumumab added from second cycle    12/28/2014 - 12/31/2014 Hospital Admission Was admitted for dehydration, neutropenia fever with UTI, and severe skin rash.   02/08/2015 - 02/12/2015 Hospital Admission He was admitted for upper GI bleeding, e.g. showed a gastric ulcer with clots, status post appendectomy injection. He also received a blood transfusion.   03/01/2015 Tumor Marker CEA 694, CA19.9 462   03/13/2015 Imaging CT CAP showed partial response, no new lesions.    03/15/2015 - 05/23/2015 Chemotherapy restart FOLFOX, held again on 8/9 due to GI bleeding    05/31/2015 - 06/02/2015 Hospital Admission He was admitted to [facility] in [place] due to upper GI bleeding, EGD showed gastric and dudenal ulcers    06/27/2015 - 06/29/2015 Hospital Admission he was admitted for dairrhea and pancolitis, c-diff and stool cultures were negative, treated with antibiotics   07/12/2015 -  Chemotherapy panitumumab 71m/kg, every 2 weeks         CURRENT THERAPY: panitumumab 644mkg, every 2 weeks, started on 07/12/2015   INTERVAL HISTORY:   Jason Reilly as scheduled. He was last treated with PANITUMUMAB  08/09/2015. He denies nausea/vomiting. No mouth sores. No diarrhea. Stable rash over the trunk and extremities. The rash is pruritic. He denies abdominal pain. He has a good appetite.  Objective:  Vital signs in last 24 hours:  Blood pressure 118/66, pulse 87, temperature 97.6 F (36.4 C), temperature source Oral, resp. rate 18, height '5\' 5"'  (1.651 m), weight 140 lb 6.4 oz (63.685 kg), SpO2 100 %.    HEENT: No thrush or ulcers. Resp: Lungs clear bilaterally. Cardio: Regular rate and rhythm. GI: Abdomen soft and nontender. No hepatomegaly. Vascular: No leg edema. Calves soft and nontender.  Skin: Erythematous macular rash scattered over the trunk.  Port-A-Cath without erythema.  Lab Results:  Lab Results  Component Value Date   WBC 4.2 08/23/2015   HGB 12.3* 08/23/2015   HCT 37.3* 08/23/2015   MCV 88.7 08/23/2015   PLT 98* 08/23/2015   NEUTROABS 2.9 08/23/2015    Imaging:  No results found.  Medications: I have reviewed the patient's current medications.  Assessment/Plan: 1. Metastatic sigmoid colon cancer, with diffuse liver, lungs, node and left adrenal gland metastases. KRAS/NRAS wild type, MSI stable.   FOLFOX initiated 11/30/2014;   Cycle 2 FOLFOX with PANITUMUMAB added 12/14/2014.   Hospitalized with dehydration, febrile neutropenia and a severe skin rash 12/28/2014 through 12/31/2014.   Cycle 3 FOLFOX 01/11/2015, PANITUMUMAB held; Neulasta support on day 3.   Cycle 4 FOLFOX with dose reduced PANITUMUMAB 02/02/2015;   Hospitalization with upper GI bleed with findings of a gastric ulcer 02/08/2015 through 02/12/2015.   CEA and CA-19-9 improved 02/28/2015.   Restaging CT evaluation 03/13/2015  with slight decrease in prominence of masslike thickening involving the sigmoid colon, slight decrease in size of hepatic metastatic masses and left adrenal metastasis. Slight decrease in size of a lymph node in the sigmoid mesentery. Stable left periaortic lymph  node. New or increased calcification involving the body of the right adrenal gland.   Cycle 5 FOLFOX 03/15/2015 with no 5-FU bolus.  Cycle 6 FOLFOX 03/30/2015  Cycle 7 FOLFOX 04/12/2015  Cycle 8 FOLFOX 04/26/2015  Cycle 9 FOLFOX 05/10/2015  Cycle 10 FOLFOX 05/24/2015  PANITUMUMAB every 2 weeks beginning 07/12/2015  CT scans chest/abdomen/pelvis 08/16/2015 with stable to slightly improved metastatic disease in the liver; interval enlargement of a 1.7 cm right adrenal nodule; primary lesion in the sigmoid colon similar; stable left retroperitoneal lymph node. No new lymphadenopathy. No suspicious pulmonary nodules.  Continuation of PANITUMUMAB every 2 weeks 2. Gastric ulcer with significant GI bleed April 2016; Hospital admission August 2016 with upper GI bleeding, EGD showed gastric and duodenal ulcers. Follow-up upper endoscopy 08/15/2015 showed a single healing gastric ulcer. There was erosive gastritis in the gastric body. 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 3. Diabetes 4. Hypertension, dilated nonischemic cardiomyopathy 5. Hepatitis B carrier 6. Hospitalization 06/27/2015 through 06/29/2015 with diarrhea and pancolitis, C. difficile negative. Treated with antibiotics. 7. Hypomagnesemia secondary to PANITUMUMAB. IV and oral magnesium. 8. Skin rash secondary to PANITUMUMAB. Minocycline initiated 08/23/2015.   Disposition: Jason Reilly appears stable. He is currently being treated with PANITUMUMAB. Recent CT scans showed overall stable disease. [name] recommends continuation of PANITUMUMAB with consideration of adding irinotecan in approximately one month.  For the skin rash he will begin minocycline 100 mg twice daily. He will continue the clindamycin gel and topical steroid cream as well.  He has hypomagnesemia. He will receive IV magnesium today. He will begin magnesium oxide 400 mg twice daily. We discussed that magnesium can cause  diarrhea. He understands to contact the office if this occurs.  He will return for a follow-up visit in 2 weeks. He will contact the office in the interim with any problems.  Patient seen with [name].  Ned Card ANP/GNP-BC   08/23/2015  10:57 AM   Attending addendum I have seen the patient, examined him. I agree with the assessment and and plan and have edited the notes.   I reviewed his restaging scan results with pt and his son. He has stable disease overall, we'll continue pain to lab. His gastric ulcer has not completely healed, I'll wait for another month before start him on therapy with Irinotecan.   I will see him back in 2 weeks.  Truitt Merle  08/23/2015

## 2015-08-23 NOTE — Progress Notes (Signed)
Pt informed that a prescription for magnesium has been sent to his pharmacy. Pt to take one tablet twice daily. Pt expressed understanding.

## 2015-08-24 ENCOUNTER — Other Ambulatory Visit: Payer: Self-pay | Admitting: *Deleted

## 2015-08-24 DIAGNOSIS — R21 Rash and other nonspecific skin eruption: Secondary | ICD-10-CM

## 2015-08-24 DIAGNOSIS — C189 Malignant neoplasm of colon, unspecified: Secondary | ICD-10-CM

## 2015-08-24 DIAGNOSIS — C187 Malignant neoplasm of sigmoid colon: Secondary | ICD-10-CM

## 2015-08-24 MED ORDER — CLINDAMYCIN PHOSPHATE 1 % EX GEL: Freq: Two times a day (BID) | CUTANEOUS | Status: DC

## 2015-08-24 MED ORDER — HYDROCORTISONE 2.5 % EX CREA: TOPICAL_CREAM | Freq: Two times a day (BID) | CUTANEOUS | Status: DC

## 2015-09-05 ENCOUNTER — Other Ambulatory Visit: Payer: Self-pay | Admitting: *Deleted

## 2015-09-05 DIAGNOSIS — C187 Malignant neoplasm of sigmoid colon: Secondary | ICD-10-CM

## 2015-09-06 ENCOUNTER — Other Ambulatory Visit: Payer: Self-pay | Admitting: Medical Oncology

## 2015-09-06 ENCOUNTER — Encounter: Payer: Self-pay | Admitting: *Deleted

## 2015-09-06 ENCOUNTER — Ambulatory Visit (HOSPITAL_BASED_OUTPATIENT_CLINIC_OR_DEPARTMENT_OTHER): Payer: Medicare Other | Admitting: Hematology

## 2015-09-06 ENCOUNTER — Telehealth: Payer: Self-pay | Admitting: Hematology

## 2015-09-06 ENCOUNTER — Other Ambulatory Visit (HOSPITAL_BASED_OUTPATIENT_CLINIC_OR_DEPARTMENT_OTHER): Payer: Medicare Other

## 2015-09-06 ENCOUNTER — Ambulatory Visit (HOSPITAL_BASED_OUTPATIENT_CLINIC_OR_DEPARTMENT_OTHER): Payer: Medicare Other

## 2015-09-06 ENCOUNTER — Telehealth: Payer: Self-pay | Admitting: Medical Oncology

## 2015-09-06 ENCOUNTER — Encounter: Payer: Self-pay | Admitting: Hematology

## 2015-09-06 ENCOUNTER — Telehealth: Payer: Self-pay | Admitting: *Deleted

## 2015-09-06 VITALS — BP 125/70 | HR 89 | Temp 97.7°F | Resp 18 | Ht 65.0 in | Wt 142.7 lb

## 2015-09-06 DIAGNOSIS — R945 Abnormal results of liver function studies: Secondary | ICD-10-CM | POA: Diagnosis not present

## 2015-09-06 DIAGNOSIS — C772 Secondary and unspecified malignant neoplasm of intra-abdominal lymph nodes: Secondary | ICD-10-CM | POA: Diagnosis not present

## 2015-09-06 DIAGNOSIS — C187 Malignant neoplasm of sigmoid colon: Secondary | ICD-10-CM | POA: Diagnosis present

## 2015-09-06 DIAGNOSIS — K254 Chronic or unspecified gastric ulcer with hemorrhage: Secondary | ICD-10-CM

## 2015-09-06 DIAGNOSIS — C189 Malignant neoplasm of colon, unspecified: Secondary | ICD-10-CM

## 2015-09-06 DIAGNOSIS — R21 Rash and other nonspecific skin eruption: Secondary | ICD-10-CM

## 2015-09-06 DIAGNOSIS — C7800 Secondary malignant neoplasm of unspecified lung: Secondary | ICD-10-CM | POA: Diagnosis not present

## 2015-09-06 DIAGNOSIS — D509 Iron deficiency anemia, unspecified: Secondary | ICD-10-CM | POA: Diagnosis not present

## 2015-09-06 DIAGNOSIS — I10 Essential (primary) hypertension: Secondary | ICD-10-CM | POA: Diagnosis not present

## 2015-09-06 DIAGNOSIS — E46 Unspecified protein-calorie malnutrition: Secondary | ICD-10-CM | POA: Diagnosis not present

## 2015-09-06 DIAGNOSIS — C7972 Secondary malignant neoplasm of left adrenal gland: Secondary | ICD-10-CM

## 2015-09-06 DIAGNOSIS — C787 Secondary malignant neoplasm of liver and intrahepatic bile duct: Secondary | ICD-10-CM

## 2015-09-06 DIAGNOSIS — D696 Thrombocytopenia, unspecified: Secondary | ICD-10-CM

## 2015-09-06 DIAGNOSIS — E119 Type 2 diabetes mellitus without complications: Secondary | ICD-10-CM | POA: Diagnosis not present

## 2015-09-06 DIAGNOSIS — Z5112 Encounter for antineoplastic immunotherapy: Secondary | ICD-10-CM | POA: Diagnosis present

## 2015-09-06 LAB — CBC WITH DIFFERENTIAL/PLATELET
BASO%: 0.6 % (ref 0.0–2.0)
BASOS ABS: 0 10*3/uL (ref 0.0–0.1)
EOS ABS: 0.2 10*3/uL (ref 0.0–0.5)
EOS%: 6.5 % (ref 0.0–7.0)
HEMATOCRIT: 35.3 % — AB (ref 38.4–49.9)
HEMOGLOBIN: 11.5 g/dL — AB (ref 13.0–17.1)
LYMPH#: 0.7 10*3/uL — AB (ref 0.9–3.3)
LYMPH%: 20.3 % (ref 14.0–49.0)
MCH: 29 pg (ref 27.2–33.4)
MCHC: 32.6 g/dL (ref 32.0–36.0)
MCV: 88.9 fL (ref 79.3–98.0)
MONO#: 0.4 10*3/uL (ref 0.1–0.9)
MONO%: 9.5 % (ref 0.0–14.0)
NEUT%: 63.1 % (ref 39.0–75.0)
NEUTROS ABS: 2.3 10*3/uL (ref 1.5–6.5)
PLATELETS: 93 10*3/uL — AB (ref 140–400)
RBC: 3.97 10*6/uL — ABNORMAL LOW (ref 4.20–5.82)
RDW: 15.6 % — AB (ref 11.0–14.6)
WBC: 3.7 10*3/uL — AB (ref 4.0–10.3)

## 2015-09-06 LAB — CANCER ANTIGEN 19-9: CA 19-9: 53.7 U/mL — ABNORMAL HIGH (ref <35.0)

## 2015-09-06 LAB — COMPREHENSIVE METABOLIC PANEL (CC13)
ALBUMIN: 3.3 g/dL — AB (ref 3.5–5.0)
ALK PHOS: 208 U/L — AB (ref 40–150)
ALT: 31 U/L (ref 0–55)
AST: 41 U/L — ABNORMAL HIGH (ref 5–34)
Anion Gap: 9 mEq/L (ref 3–11)
BILIRUBIN TOTAL: 0.84 mg/dL (ref 0.20–1.20)
BUN: 18.4 mg/dL (ref 7.0–26.0)
CALCIUM: 9.3 mg/dL (ref 8.4–10.4)
CO2: 23 mEq/L (ref 22–29)
Chloride: 107 mEq/L (ref 98–109)
Creatinine: 1.1 mg/dL (ref 0.7–1.3)
EGFR: 73 mL/min/{1.73_m2} — AB (ref >90)
GLUCOSE: 121 mg/dL (ref 70–140)
POTASSIUM: 4.4 meq/L (ref 3.5–5.1)
Sodium: 139 mEq/L (ref 136–145)
TOTAL PROTEIN: 7.6 g/dL (ref 6.4–8.3)

## 2015-09-06 LAB — MAGNESIUM (CC13): MAGNESIUM: 1.2 mg/dL — AB (ref 1.5–2.5)

## 2015-09-06 LAB — CEA: CEA: 73.7 ng/mL — ABNORMAL HIGH (ref 0.0–5.0)

## 2015-09-06 MED ORDER — SODIUM CHLORIDE 0.9 % IV SOLN
6.2000 mg/kg | Freq: Once | INTRAVENOUS | Status: AC
  Administered 2015-09-06: 400 mg via INTRAVENOUS
  Filled 2015-09-06: qty 20

## 2015-09-06 MED ORDER — SODIUM CHLORIDE 0.9 % IV SOLN
Freq: Once | INTRAVENOUS | Status: AC
  Administered 2015-09-06: 16:00:00 via INTRAVENOUS

## 2015-09-06 MED ORDER — SODIUM CHLORIDE 0.9 % IV SOLN
6.0000 g | Freq: Once | INTRAVENOUS | Status: AC
  Administered 2015-09-06: 6 g via INTRAVENOUS
  Filled 2015-09-06: qty 12

## 2015-09-06 MED ORDER — HEPARIN SOD (PORK) LOCK FLUSH 100 UNIT/ML IV SOLN
500.0000 [IU] | Freq: Once | INTRAVENOUS | Status: AC
  Administered 2015-09-06: 500 [IU] via INTRAVENOUS
  Filled 2015-09-06: qty 5

## 2015-09-06 MED ORDER — SODIUM CHLORIDE 0.9 % IJ SOLN
10.0000 mL | INTRAMUSCULAR | Status: DC | PRN
  Administered 2015-09-06: 10 mL via INTRAVENOUS
  Filled 2015-09-06: qty 10

## 2015-09-06 NOTE — Telephone Encounter (Signed)
Per Dr Burr Medico it is okay to treat pt today with vextibix and give magnesium as ordered in treatment plan.

## 2015-09-06 NOTE — Patient Instructions (Signed)
[facility] Discharge Instructions for Patients Receiving Chemotherapy  Today you received the following chemotherapy agents Vectibix  To help prevent nausea and vomiting after your treatment, we encourage you to take your nausea medication     If you develop nausea and vomiting that is not controlled by your nausea medication, call the clinic.   BELOW ARE SYMPTOMS THAT SHOULD BE REPORTED IMMEDIATELY:  *FEVER GREATER THAN 100.5 F  *CHILLS WITH OR WITHOUT FEVER  NAUSEA AND VOMITING THAT IS NOT CONTROLLED WITH YOUR NAUSEA MEDICATION  *UNUSUAL SHORTNESS OF BREATH  *UNUSUAL BRUISING OR BLEEDING  TENDERNESS IN MOUTH AND THROAT WITH OR WITHOUT PRESENCE OF ULCERS  *URINARY PROBLEMS  *BOWEL PROBLEMS  UNUSUAL RASH Items with * indicate a potential emergency and should be followed up as soon as possible.  Feel free to call the clinic you have any questions or concerns. The clinic phone number is [phone].  Please show the CHEMO ALERT CARD at check-in to the Emergency Department and triage nurse.   

## 2015-09-06 NOTE — Progress Notes (Signed)
[place]  [phone] [phone]  Clinic follow up Note   Patient Care Team: [name], MD as PCP - General (Specialist) [name], CRNP as Nurse Practitioner (Nurse Practitioner) Truitt Merle, MD as Consulting Physician (Hematology) Ladene Artist, MD as Consulting Physician (Gastroenterology) 09/06/2015  CHIEF COMPLAINTS Follow up metastatic sigmoid colon cancer  Oncology History   He is a Metastatic colon cancer to liver   Staging form: Colon and Rectum, AJCC 7th Edition     Clinical: Stage Unknown ([place], NX, M1) - Unsigned      Cancer of sigmoid colon ([place])   10/28/2014 Tumor Marker AFP 3.2 CEA > 10,000 CA 19-9 12,929.6. tumor (-) KRAS and NRAS mutation.    11/10/2014 Imaging PET scan showed hypermetabolic mass in the sigmoid colon was noted metastasis in the retroperitoneum. Probable left adrenal and pulmonary metastasis, and diffuse liver metastasis.   11/21/2014 Initial Diagnosis Metastatic colon cancer to liver, lung, abd nodes and left adrenal gland. Diagnosis was made by liver biopsy.    11/30/2014 -  Chemotherapy First line chemo [id number], Panitumumab added from second cycle    12/28/2014 - 12/31/2014 Hospital Admission Was admitted for dehydration, neutropenia fever with UTI, and severe skin rash.   02/08/2015 - 02/12/2015 Hospital Admission He was admitted for upper GI bleeding, e.g. showed a gastric ulcer with clots, status post appendectomy injection. He also received a blood transfusion.   03/01/2015 Tumor Marker CEA 694, CA19.9 462   03/13/2015 Imaging CT CAP showed partial response, no new lesions.    03/15/2015 - 05/23/2015 Chemotherapy restart FOLFOX, held again on 8/9 due to GI bleeding    05/31/2015 - 06/02/2015 Hospital Admission He was admitted to [facility] in [place] due to upper GI bleeding, EGD showed gastric and dudenal ulcers    06/27/2015 - 06/29/2015 Hospital Admission he was admitted for dairrhea and pancolitis, c-diff and  stool cultures were negative, treated with antibiotics   07/12/2015 -  Chemotherapy panitumumab 28m/kg, every 2 weeks     CURRENT THERAPY: panitumumab 676mkg, every 2 weeks, started on 07/12/2015   HISTORY OF PRESENTING ILLNESS:  Jason Reilly.o. male is here because of abnormal CT findings, which is very suspicious for malignancy. [place] He came in with his son and an interpreter.  He has been feeling fatigued since two month ago. He is still able to do all ADLs. He otherwise denies any pain, bloating or nausea.  He lost about 20lbs in 3 month. His appetite is lower than before, eats less, no change of his bowl habits.  She denied any hematochezia or melana. Per his son, he has had some personality changes daily, irritable, slightly confused some time.  He was evaluated by his primary care physician. Lab test reviewed hepatitis B infection, which he did not know before, and elevated alkaline phosphatase, his liver function and the rest of the liver function was not remarkable. [place] abdomen was obtained on 07/22/2014, which showed diffusely abnormal liver with multiple echogenic lesions. CT of abdomen with and without contrast was done on 08/26/2014, which reviewed here at a medically with multiple large partially calcified hepatic masses consistent with metastatic disease. Mild retroperitoneal adenopathy with the largest node measuring 1.6 cm. And nonspecific 1.4 cm left adrenal nodule was also noticed. His tumor marker showed CEA greater than 10,000, CA 19-9 12,929, AFP 3.2 (normal). [facility] and was evaluated by nurse practitioner [name]. Treatment for hepatitis B was not recommended based on his virus load.  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 He was evaluated by a cardiologist in 2014. He denies any significant dyspnea on exertion.  No leg swollen.  INTERIM HISTORY: Jason Reilly returns for follow-up and 5th cycle Vectibix. His skin rashes on his chest has nearly resolved, his face is still dark red with significant dryness,  the rash has improved too, he has no other new complaints. His appetite and energy level are decent. He is eating well. He denies significant pain, bloating, nausea or diarrhea. He is going to start insulin pump for his diabetes.  MEDICAL HISTORY:  Past Medical History  Diagnosis Date  . Tachycardia   . Abnormal EKG   . Hypertension   . Non-ischemic cardiomyopathy ([place])   . Hepatitis B   . H. pylori infection   . Gastric ulcer   . Metastatic colon cancer to liver ([place])   . Diabetes mellitus without complication ([place])     metformin    SURGICAL HISTORY: Past Surgical History  Procedure Laterality Date  . Nuclear stress test  03/03/2013    High risk - consistent with nonischemic cardiomyopathy  . Left and right heart catheterization with coronary angiogram N/A 03/29/2013    Procedure: LEFT AND RIGHT HEART CATHETERIZATION WITH CORONARY ANGIOGRAM;  Surgeon: Pixie Casino, MD;  Location: [facility] CATH LAB;  Service: Cardiovascular;  Laterality: N/A;  . Esophagogastroduodenoscopy N/A 02/08/2015    Procedure: ESOPHAGOGASTRODUODENOSCOPY (EGD);  Surgeon: Ladene Artist, MD;  Location: [facility] ENDOSCOPY;  Service: Endoscopy;  Laterality: N/A;  . Esophagogastroduodenoscopy (egd) with propofol N/A 08/15/2015    Procedure: ESOPHAGOGASTRODUODENOSCOPY (EGD) WITH PROPOFOL;  Surgeon: Ladene Artist, MD;  Location: [facility] ENDOSCOPY;  Service: Endoscopy;  Laterality: N/A;    SOCIAL HISTORY: Social History   Social History  . Marital Status: Married    Spouse Name: N/A  . Number of Children: N/A  . Years of Education: N/A   Occupational History  . Not on file.   Social History Main Topics  . Smoking status: Former Research scientist (life sciences)  . Smokeless tobacco: Never Used  . Alcohol Use: Yes     Comment: occasional  . Drug Use: No  .  Sexual Activity: No   Other Topics Concern  . Not on file   Social History Narrative   Married   Enjoys walking   Has lived in [place] > 16 years    FAMILY HISTORY: No family history of liver disease or malignancy.  ALLERGIES:  has No Known Allergies.  MEDICATIONS:  Current Outpatient Prescriptions on File Prior to Visit  Medication Sig Dispense Refill  . clindamycin (CLINDAGEL) 1 % gel Apply topically 2 (two) times daily. 30 g 1  . folic acid (FOLVITE) 1 MG tablet Take 1 mg by mouth daily.    . hydrocortisone 2.5 % cream Apply topically 2 (two) times daily. 30 g 1  . lidocaine-prilocaine (EMLA) cream Apply 1 application topically as needed. Apply to [facility] cath at least one hour before needle stick. 30 g 2  . lisinopril-hydrochlorothiazide (PRINZIDE,ZESTORETIC) 10-12.5 MG per tablet Take 1 tablet by mouth daily.  3  . magnesium oxide (MAG-OX) 400 (241.3 MG) MG tablet Take 1 tablet (400 mg total) by mouth 2 (two) times daily. 60 tablet 1  . metFORMIN (GLUCOPHAGE) 500 MG tablet Take by mouth 2 (two) times daily with a  meal.    . metroNIDAZOLE (FLAGYL) 500 MG tablet Take 1 tablet (500 mg total) by mouth every 8 (eight) hours. 30 tablet 0  . minocycline (MINOCIN) 100 MG capsule Take 1 capsule (100 mg total) by mouth 2 (two) times daily. 60 capsule 1  . nadolol (CORGARD) 20 MG tablet Take 1 tablet (20 mg total) by mouth 2 (two) times daily. 60 tablet 0  . ondansetron (ZOFRAN) 8 MG tablet Take 1 tablet (8 mg total) by mouth every 8 (eight) hours as needed for nausea or vomiting. 45 tablet 0  . pantoprazole (PROTONIX) 40 MG tablet Take 1 tablet (40 mg total) by mouth daily. (Patient taking differently: Take 40 mg by mouth 2 (two) times daily. ) 30 tablet 11  . prochlorperazine (COMPAZINE) 10 MG tablet Take 1 tablet (10 mg total) by mouth every 6 (six) hours as needed for nausea or vomiting. 30 tablet 1   No current facility-administered medications on file prior to visit.  ;   REVIEW OF  SYSTEMS:   Constitutional: Denies fevers, chills or abnormal night sweats, (+) fatigue  Eyes: Denies blurriness of vision, double vision or watery eyes Ears, nose, mouth, throat, and face: Denies mucositis or sore throat Respiratory: Denies cough, dyspnea or wheezes Cardiovascular: Denies palpitation, chest discomfort or lower extremity swelling Gastrointestinal: Denies nausea, heartburn or change in bowel habits Skin: Denies abnormal skin rashes Lymphatics: Denies new lymphadenopathy or easy bruising Neurological:Denies numbness, tingling or new weaknesses Behavioral/Psych: Mood is stable, no new changes, (+) insomnia All other systems were reviewed with the patient and are negative.  PHYSICAL EXAMINATION: BP 125/70 mmHg  Pulse 89  Temp(Src) 97.7 F (36.5 C) (Oral)  Resp 18  Ht '5\' 5"'  (1.651 m)  Wt 142 lb 11.2 oz (64.728 kg)  BMI 23.75 kg/m2  SpO2 100%  ECOG PERFORMANCE STATUS: 1 Vital sign were taken at in the infusion room, within normal limits. GENERAL:alert, no distress and comfortable SKIN: (+) Dry skin, diffuse acne like skin rashes on the face, neck and trunk, no skin ulcer or discharge. EYES: normal, conjunctiva are pink and non-injected, sclera clear OROPHARYNX:no exudate, no erythema and lips, buccal mucosa, and tongue normal  NECK: supple, thyroid normal size, non-tender, without nodularity LYMPH:  no palpable lymphadenopathy in the cervical, axillary or inguinal LUNGS: clear to auscultation and percussion with normal breathing effort, (+) crackles on b/l lung base  HEART: regular rate & rhythm and no murmurs and no lower extremity edema ABDOMEN:abdomen soft, non-tender, no hepatomegaly, no splenomegaly and normal bowel sounds Musculoskeletal:no cyanosis of digits and no clubbing  PSYCH: alert & oriented x 3 with fluent speech NEURO: no focal motor/sensory deficits  LABORATORY DATA:  I have reviewed the data as listed CBC Latest Ref Rng 09/06/2015 08/23/2015  08/09/2015  WBC 4.0 - 10.3 10e3/uL 3.7(L) 4.2 3.5(L)  Hemoglobin 13.0 - 17.1 g/dL 11.5(L) 12.3(L) 12.8(L)  Hematocrit 38.4 - 49.9 % 35.3(L) 37.3(L) 38.4  Platelets 140 - 400 10e3/uL 93(L) 98(L) 105(L)    CMP Latest Ref Rng 09/06/2015 08/23/2015 08/09/2015  Glucose 70 - 140 mg/dl 121 112 159(H)  BUN 7.0 - 26.0 mg/dL 18.4 17.8 11.6  Creatinine 0.7 - 1.3 mg/dL 1.1 0.9 0.9  Sodium 136 - 145 mEq/L 139 140 141  Potassium 3.5 - 5.1 mEq/L 4.4 3.9 4.1  Chloride 101 - 111 mmol/L - - -  CO2 22 - 29 mEq/L '23 27 25  ' Calcium 8.4 - 10.4 mg/dL 9.3 8.9 9.6  Total Protein 6.4 - 8.3  g/dL 7.6 8.3 9.0(H)  Total Bilirubin 0.20 - 1.20 mg/dL 0.84 0.86 1.28(H)  Alkaline Phos 40 - 150 U/L 208(H) 219(H) 241(H)  AST 5 - 34 U/L 41(H) 60(H) 77(H)  ALT 0 - 55 U/L 31 48 58(H)     INITIAL tumor markers AFP 3.2 CEA > 10,000 CA 19-9 12,929.6  Results for Jason Reilly, Jason Reilly (MRN [id number]) as of 09/06/2015 22:39  Ref. Range 05/09/2015 13:45 06/06/2015 15:06 07/05/2015 08:11 08/09/2015 08:40 09/06/2015 09:57  CA 19-9 Latest Ref Range: <35.0 U/mL 164.7 (H) 94.4 (H) 310.6 (H) 49.7 (H) 53.7 (H)  CEA Latest Ref Range: 0.0-5.0 ng/mL 249.5 (H) 90.7 (H) 386.8 (H) 96.3 (H) 73.7 (H)     Pathology report  Liver, needle/core biopsy - METASTATIC ADENOCARCINOMA, SEE COMMENT. Microscopic Comment The adenocarcinoma demonstrates the following immunophenotype: Cytokeratin 7 - negative expression. Cytokeratin 20 - strong diffuse expression. CD2 - strong diffuse expression. Overall the morphology and immunophenotype are that of metastatic adenocarcinoma primary to colorectum. The recent nuclear medicine scan demonstrating sigmoid mass with associated liver masses is noted.  [facility] test result:    RADIOGRAPHIC STUDIES: I have personally reviewed the outside CT scan image with patient and his son.   CT chest, abdomen and pelvis with IV contrast oN 08/16/2015 IMPRESSION: 1. Today's study demonstrates stable to slightly improved  metastatic disease in the liver, as discussed above. 2. Interval enlargement of a 1.7 cm right adrenal nodule, suspicious for progressive metastasis. 3. Primary lesion in the sigmoid colon appears similar to the prior study, but there is increasing colonic wall thickening throughout the descending colon. This is associated with some surrounding inflammatory changes, favored to reflect an acute colitis, although expansion of infiltrative neoplasm is difficult to entirely exclude. 4. Previously noted mildly enlarged left retroperitoneal lymph node is stable in size. No new lymphadenopathy. 5. No suspicious pulmonary nodules on today's examination. 6. Additional incidental findings, as above, similar prior studies.  EGD in [facility] [place] Grade 2 versus was found in the low cervical esophagus, they will 5 mm in largest diameter.  Mild portal hypertension gets dropsy was found in the gastric fundus One oozing cratered gastric ulcer was seen at the incisura and gastric antrum, 7X47m 2 nonbleeding superficial duodenal ulcers with no stigmata of bleeding was found in the duodenal bowel  ASSESSMENT & PLAN:  69year old [place], with past history of hypertension and dilated nonischemic gammopathy with EF 25%, no clinical signs of heart failure, who was found to have hepatitis B infection lately, and multiple liver lesions on the CT scan. He has extremely high CEA and CA 19-9 levels. PET scan reviewed a hypermetabolic sigmoid colon mass, diffuse liver metastasis, probable lung and adrenal gland metastasis.  1. Metastatic sigmoid colon cancer, with diffuse liver, lungs, node and left adrenal gland metastases. KRAS/NRAS wild type, MSI-stable -Liver biopsy showed metastatic adenocarcinoma. His tumor were strongly positive for CK20 and CD2, consistent with primary colorectal primary. KRAS and NRAS mutations were not detected.  -Pt understands that this is incurable cancer, and he has very  high disease burden and overall prognosis is poor. The treatment goal is palliative -His bilirubinemia has improved after first cycle chemotherapy.  -Giving the wild-type KRAS/NRAS genotype, he would benefit from EGFR targeted therapy. He initially started panitumumab from second cycle chemo. He had G2-3 skin rash with neutropenic fever, and was admitted for GI bleeding after second dose panitumumab. We restarted it again.  -Avastin contraindicated due to his gastric ulcer and bleeding in April 2016  and Aug 2016  -I discussed his restaging CT scan from 08/16/15, which showed stable disease overall. He is doing well and his tumor marker has come down dramatically, supporting good clinical response.  -Due to the second episode of upper GI bleeding and ulcers, his chemo has been held  -Due to the possible relationship of chemotherapy and upper GI ulcer in the bleeding, I probably will change his chemotherapy treatment to [place] when his ulcers heal well.  -Repeat his EGD in October 2016 showed near complete healed gastric ulcer.  -His Foundation one can only test showed EGFR amplification, no KRAS/NRAS/BRAF mutations. -I have restarted panitumumab, he tolerated well except grade 2 skin rash. We'll continue it every 2 weeks for now. I'll add on chemotherapy irrinotecan in 2 weeks, with anticipation of his gastric cancer will be completely healed by them.   2. Grade 1-2 skin rashes,  -Secondary to panitumumab, much improved and now -continue hydrocortisone 2.5%, and clindamycin gel 1%  twice daily -He knows to avoid sun exposure, and call me if it gets worse.  3. Gastric ulcer with significant GI bleeding in April and Aug 2016 -He is on PPI, continue twice daily  -Repeat his EGD on 08/15/2015 showed near complete healing of his gastric ulcer -continue Nadolol 71m bid, per Dr. SFuller Plan 4. Poorly controlled diabetes -HbA1c was 8.5, blood glucose not well controlled  -He will start insulin pump,  follow up with primary care physician.  5. HTN, Dilated nonischemic ischemia cardiomyopathy with EF 25% -He is clinically doing well without symptoms of CHF. However this is probably going to impact his chemotherapy. I'll try to avoid cardiotoxic chemotherapy agent and avoid fluid overload during chemotherapy. -Continue follow-up with cardiology.  6 Hepatitis B carrier, with mild portal hypertension  -Per liver clinic, no need for treatment. Follow-up with liver clinic.  7. Malnutrition -improved -I encouraged him to eat more, and take supplements as needed. -follow up with Dietitian   8. Anemia secondary to GI bleeding and iron deficiency  -Repeat lab on 06/06/2015 showed ferritin 117, serum iron 24, saturation 8%, which supports iron deficiency -He received IV Feraheme again in Aug 2016 after GI bleeding  -Hb 11.5, slightly worse, will continue close monitoring -Repeat ferritin and iron study in 2 weeks, we'll check every 1-2 months  9. Mild abnormal LFTs -Secondary to panitumumab vs liver metastasis  -much improved, will monitor closely    10. Thrombocytopenia -Possibly related to his previous chemotherapy, although his last chemotherapy was 3 months ago -Continue monitoring  Plan -5th dose panitumumab today, and continue every 2 weeks -will start Irinotecan in 2 weeks if anemia stable  -RTC on 11/22 with chemo and follow up   All questions were answered. The patient knows to call the clinic with any problems, questions or concerns.  I spent 25 minutes counseling the patient face to face. The total time spent in the appointment was 30 minutes and more than 50% was on counseling.     FTruitt Merle MD 09/06/2015   10:38 PM

## 2015-09-06 NOTE — Telephone Encounter (Signed)
Per staff message and POF I have scheduled appts. Advised scheduler of appts. JMW  

## 2015-09-06 NOTE — Telephone Encounter (Signed)
per pof to sch pt appt-sent MW email to sch pt trmt-pt to get b4 leaving trmtroom**

## 2015-09-06 NOTE — Progress Notes (Signed)
Oncology Nurse Navigator Documentation  Oncology Nurse Navigator Flowsheets 09/06/2015  Navigator Encounter Type 6 month;Treatment  Patient Visit Type Medonc  Treatment Phase Treatment--currently on panitumumab every 2 weeks  Barriers/Navigation Needs Family concerns--requesting interpreter at visits  Interventions Changed demographics back to request interpreter for his visits in future.  Time Spent with Patient 31  Says he has more specific questions to ask MD, but is difficult to ask without interpreter. Says he understands English well enough for daily communication, but needs help with medical visits.

## 2015-09-19 ENCOUNTER — Other Ambulatory Visit (HOSPITAL_BASED_OUTPATIENT_CLINIC_OR_DEPARTMENT_OTHER): Payer: Medicare Other

## 2015-09-19 ENCOUNTER — Encounter: Payer: Self-pay | Admitting: Hematology

## 2015-09-19 ENCOUNTER — Ambulatory Visit (HOSPITAL_BASED_OUTPATIENT_CLINIC_OR_DEPARTMENT_OTHER): Payer: Medicare Other

## 2015-09-19 ENCOUNTER — Ambulatory Visit (HOSPITAL_BASED_OUTPATIENT_CLINIC_OR_DEPARTMENT_OTHER): Payer: Medicare Other | Admitting: Hematology

## 2015-09-19 ENCOUNTER — Telehealth: Payer: Self-pay | Admitting: Hematology

## 2015-09-19 VITALS — BP 132/59 | HR 66 | Temp 97.5°F | Resp 18 | Ht 65.0 in | Wt 143.9 lb

## 2015-09-19 DIAGNOSIS — C187 Malignant neoplasm of sigmoid colon: Secondary | ICD-10-CM

## 2015-09-19 DIAGNOSIS — R21 Rash and other nonspecific skin eruption: Secondary | ICD-10-CM | POA: Diagnosis not present

## 2015-09-19 DIAGNOSIS — Z5112 Encounter for antineoplastic immunotherapy: Secondary | ICD-10-CM | POA: Diagnosis present

## 2015-09-19 DIAGNOSIS — Z5111 Encounter for antineoplastic chemotherapy: Secondary | ICD-10-CM

## 2015-09-19 DIAGNOSIS — C189 Malignant neoplasm of colon, unspecified: Secondary | ICD-10-CM

## 2015-09-19 LAB — CBC WITH DIFFERENTIAL/PLATELET
BASO%: 0.5 % (ref 0.0–2.0)
Basophils Absolute: 0 10*3/uL (ref 0.0–0.1)
EOS ABS: 0.2 10*3/uL (ref 0.0–0.5)
EOS%: 4.8 % (ref 0.0–7.0)
HCT: 33.1 % — ABNORMAL LOW (ref 38.4–49.9)
HEMOGLOBIN: 11 g/dL — AB (ref 13.0–17.1)
LYMPH#: 1 10*3/uL (ref 0.9–3.3)
LYMPH%: 20 % (ref 14.0–49.0)
MCH: 29.5 pg (ref 27.2–33.4)
MCHC: 33.3 g/dL (ref 32.0–36.0)
MCV: 88.7 fL (ref 79.3–98.0)
MONO#: 0.4 10*3/uL (ref 0.1–0.9)
MONO%: 8.5 % (ref 0.0–14.0)
NEUT%: 66.2 % (ref 39.0–75.0)
NEUTROS ABS: 3.2 10*3/uL (ref 1.5–6.5)
PLATELETS: 104 10*3/uL — AB (ref 140–400)
RBC: 3.73 10*6/uL — ABNORMAL LOW (ref 4.20–5.82)
RDW: 15.2 % — AB (ref 11.0–14.6)
WBC: 4.8 10*3/uL (ref 4.0–10.3)

## 2015-09-19 LAB — COMPREHENSIVE METABOLIC PANEL (CC13)
ALBUMIN: 3.4 g/dL — AB (ref 3.5–5.0)
ALK PHOS: 205 U/L — AB (ref 40–150)
ALT: 33 U/L (ref 0–55)
AST: 48 U/L — AB (ref 5–34)
Anion Gap: 9 mEq/L (ref 3–11)
BUN: 18 mg/dL (ref 7.0–26.0)
CALCIUM: 9.2 mg/dL (ref 8.4–10.4)
CHLORIDE: 104 meq/L (ref 98–109)
CO2: 24 mEq/L (ref 22–29)
Creatinine: 1 mg/dL (ref 0.7–1.3)
EGFR: 82 mL/min/{1.73_m2} — AB (ref >90)
Glucose: 138 mg/dl (ref 70–140)
POTASSIUM: 4 meq/L (ref 3.5–5.1)
SODIUM: 137 meq/L (ref 136–145)
Total Bilirubin: 0.97 mg/dL (ref 0.20–1.20)
Total Protein: 7.6 g/dL (ref 6.4–8.3)

## 2015-09-19 LAB — IRON AND TIBC CHCC
%SAT: 38 % (ref 20–55)
Iron: 81 ug/dL (ref 42–163)
TIBC: 210 ug/dL (ref 202–409)
UIBC: 129 ug/dL (ref 117–376)

## 2015-09-19 LAB — FERRITIN CHCC: FERRITIN: 210 ng/mL (ref 22–316)

## 2015-09-19 LAB — MAGNESIUM (CC13): Magnesium: 1 mg/dl — CL (ref 1.5–2.5)

## 2015-09-19 MED ORDER — ATROPINE SULFATE 1 MG/ML IJ SOLN
INTRAMUSCULAR | Status: AC
  Filled 2015-09-19: qty 1

## 2015-09-19 MED ORDER — ATROPINE SULFATE 1 MG/ML IJ SOLN
0.5000 mg | Freq: Once | INTRAMUSCULAR | Status: AC | PRN
  Administered 2015-09-19: 0.5 mg via INTRAVENOUS

## 2015-09-19 MED ORDER — SODIUM CHLORIDE 0.9 % IJ SOLN
10.0000 mL | INTRAMUSCULAR | Status: DC | PRN
  Administered 2015-09-19: 10 mL
  Filled 2015-09-19: qty 10

## 2015-09-19 MED ORDER — SODIUM CHLORIDE 0.9 % IV SOLN
6.0000 g | Freq: Once | INTRAVENOUS | Status: AC
  Administered 2015-09-19: 6 g via INTRAVENOUS
  Filled 2015-09-19: qty 12

## 2015-09-19 MED ORDER — HEPARIN SOD (PORK) LOCK FLUSH 100 UNIT/ML IV SOLN
500.0000 [IU] | Freq: Once | INTRAVENOUS | Status: AC | PRN
  Administered 2015-09-19: 500 [IU]
  Filled 2015-09-19: qty 5

## 2015-09-19 MED ORDER — PANITUMUMAB CHEMO INJECTION 100 MG/5ML
6.2000 mg/kg | Freq: Once | INTRAVENOUS | Status: AC
  Administered 2015-09-19: 400 mg via INTRAVENOUS
  Filled 2015-09-19: qty 20

## 2015-09-19 MED ORDER — LOPERAMIDE HCL 2 MG PO CAPS: 2.0000 mg | ORAL_CAPSULE | ORAL | Status: DC | PRN

## 2015-09-19 MED ORDER — CLINDAMYCIN PHOSPHATE 1 % EX GEL
Freq: Two times a day (BID) | CUTANEOUS | Status: DC
Start: 2015-09-19 — End: 2015-12-25

## 2015-09-19 MED ORDER — SODIUM CHLORIDE 0.9 % IV SOLN
Freq: Once | INTRAVENOUS | Status: AC
  Administered 2015-09-19: 13:00:00 via INTRAVENOUS

## 2015-09-19 MED ORDER — FOLIC ACID 1 MG PO TABS: 1.0000 mg | ORAL_TABLET | Freq: Every day | ORAL | Status: DC

## 2015-09-19 MED ORDER — IRINOTECAN HCL CHEMO INJECTION 100 MG/5ML
177.0000 mg/m2 | Freq: Once | INTRAVENOUS | Status: AC
  Administered 2015-09-19: 300 mg via INTRAVENOUS
  Filled 2015-09-19: qty 15

## 2015-09-19 MED ORDER — SODIUM CHLORIDE 0.9 % IV SOLN
Freq: Once | INTRAVENOUS | Status: AC
  Administered 2015-09-19: 13:00:00 via INTRAVENOUS
  Filled 2015-09-19: qty 4

## 2015-09-19 NOTE — Telephone Encounter (Signed)
Gave and printed appt sched and avs for pt; for DEC and Jan  °

## 2015-09-19 NOTE — Progress Notes (Signed)
[name]  [phone] [phone]  Clinic follow up Note   Patient Care Team: [name], MD as PCP - General (Specialist) Jason Reilly, CRNP as Nurse Practitioner (Nurse Practitioner) Truitt Merle, MD as Consulting Physician (Hematology) Ladene Artist, MD as Consulting Physician (Gastroenterology) 09/19/2015  CHIEF COMPLAINTS Follow up metastatic sigmoid colon cancer  Oncology History   He is a Metastatic colon cancer to liver   Staging form: Colon and Rectum, AJCC 7th Edition     Clinical: Stage Unknown ([place], NX, M1) - Unsigned      Cancer of sigmoid colon ([place])   10/28/2014 Tumor Marker AFP 3.2 CEA > 10,000 CA 19-9 12,929.6. tumor (-) KRAS and NRAS mutation.    11/10/2014 Imaging PET scan showed hypermetabolic mass in the sigmoid colon was noted metastasis in the retroperitoneum. Probable left adrenal and pulmonary metastasis, and diffuse liver metastasis.   11/21/2014 Initial Diagnosis Metastatic colon cancer to liver, lung, abd nodes and left adrenal gland. Diagnosis was made by liver biopsy.    11/30/2014 -  Chemotherapy First line chemo [id number], Panitumumab added from second cycle    12/28/2014 - 12/31/2014 Hospital Admission Was admitted for dehydration, neutropenia fever with UTI, and severe skin rash.   02/08/2015 - 02/12/2015 Hospital Admission He was admitted for upper GI bleeding, e.g. showed a gastric ulcer with clots, status post appendectomy injection. He also received a blood transfusion.   03/01/2015 Tumor Marker CEA 694, CA19.9 462   03/13/2015 Imaging CT CAP showed partial response, no new lesions.    03/15/2015 - 05/23/2015 Chemotherapy restart FOLFOX, held again on 8/9 due to GI bleeding    05/31/2015 - 06/02/2015 Hospital Admission He was admitted to [facility] in [place] due to upper GI bleeding, EGD showed gastric and dudenal ulcers    06/27/2015 - 06/29/2015 Hospital Admission he was admitted for dairrhea and pancolitis, c-diff and  stool cultures were negative, treated with antibiotics   07/12/2015 -  Chemotherapy panitumumab 6mg /kg, every 2 weeks     CURRENT THERAPY: panitumumab 6mg /kg, every 2 weeks, started on 07/12/2015, Irinotecan 180mg /m2 every 2 weeks added on 09/19/2015   HISTORY OF PRESENTING ILLNESS:  Jason Reilly 69 y.o. male is here because of abnormal CT findings, which is very suspicious for malignancy. He is on ranitidine from [place], has been on in the [place] for 16 years. He came in with his son and an interpreter.  He has been feeling fatigued since two month ago. He is still able to do all ADLs. He otherwise denies any pain, bloating or nausea.  He lost about 20lbs in 3 month. His appetite is lower than before, eats less, no change of his bowl habits.  She denied any hematochezia or melana. Per his son, he has had some personality changes daily, irritable, slightly confused some time.  He was evaluated by his primary care physician. Lab test reviewed hepatitis B infection, which he did not know before, and elevated alkaline phosphatase, his liver function and the rest of the liver function was not remarkable. [place] of abdomen was obtained on 07/22/2014, which showed diffusely abnormal liver with multiple echogenic lesions. CT of abdomen with and without contrast was done on 08/26/2014, which reviewed here at a medically with multiple large partially calcified hepatic masses consistent with metastatic disease. Mild retroperitoneal adenopathy with the largest node measuring 1.6 cm. And nonspecific 1.4 cm left adrenal nodule was also noticed. His tumor marker showed CEA greater than 10,000, CA 19-9 12,929,  AFP 3.2 (normal). He was referred to [place] [facility] liver clinic and was evaluated by nurse practitioner Jason Reilly. Treatment for hepatitis B was not recommended based on his virus load.  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 He was evaluated by a cardiologist  in 2014. He denies any significant dyspnea on exertion. No leg swollen.  INTERIM HISTORY: Jason Reilly returns for follow-up and 6th cycle Vectibix. His skin rashes has near resolved. He is doing well overall. He denies significant pain, nausea, or diarrhea. His appetite and energy level are decent. His weight is stable. He is accompanied by his son and an interpreter to the clinic today. He denies any episodes of melena or hematochezia.  MEDICAL HISTORY:  Past Medical History  Diagnosis Date  . Tachycardia   . Abnormal EKG   . Hypertension   . Non-ischemic cardiomyopathy ([place])   . Hepatitis B   . H. pylori infection   . Gastric ulcer   . Metastatic colon cancer to liver ([place])   . Diabetes mellitus without complication ([place])     metformin    SURGICAL HISTORY: Past Surgical History  Procedure Laterality Date  . Nuclear stress test  03/03/2013    High risk - consistent with nonischemic cardiomyopathy  . Left and right heart catheterization with coronary angiogram N/A 03/29/2013    Procedure: LEFT AND RIGHT HEART CATHETERIZATION WITH CORONARY ANGIOGRAM;  Surgeon: Pixie Casino, MD;  Location: [facility] CATH LAB;  Service: Cardiovascular;  Laterality: N/A;  . Esophagogastroduodenoscopy N/A 02/08/2015    Procedure: ESOPHAGOGASTRODUODENOSCOPY (EGD);  Surgeon: Ladene Artist, MD;  Location: [facility] ENDOSCOPY;  Service: Endoscopy;  Laterality: N/A;  . Esophagogastroduodenoscopy (egd) with propofol N/A 08/15/2015    Procedure: ESOPHAGOGASTRODUODENOSCOPY (EGD) WITH PROPOFOL;  Surgeon: Ladene Artist, MD;  Location: [facility] ENDOSCOPY;  Service: Endoscopy;  Laterality: N/A;    SOCIAL HISTORY: Social History   Social History  . Marital Status: Married    Spouse Name: N/A  . Number of Children: N/A  . Years of Education: N/A   Occupational History  . Not on file.   Social History Main Topics  . Smoking status: Former Research scientist (life sciences)  . Smokeless tobacco: Never Used  . Alcohol Use: Yes     Comment: occasional  .  Drug Use: No  . Sexual Activity: No   Other Topics Concern  . Not on file   Social History Narrative   Married   Enjoys walking   Has lived in [place] > 16 years    FAMILY HISTORY: No family history of liver disease or malignancy.  ALLERGIES:  has No Known Allergies.  MEDICATIONS:  Current Outpatient Prescriptions on File Prior to Visit  Medication Sig Dispense Refill  . hydrocortisone 2.5 % cream Apply topically 2 (two) times daily. 30 g 1  . lidocaine-prilocaine (EMLA) cream Apply 1 application topically as needed. Apply to [facility] cath at least one hour before needle stick. 30 g 2  . lisinopril-hydrochlorothiazide (PRINZIDE,ZESTORETIC) 10-12.5 MG per tablet Take 1 tablet by mouth daily.  3  . magnesium oxide (MAG-OX) 400 (241.3 MG) MG tablet Take 1 tablet (400 mg total) by mouth 2 (two) times daily. 60 tablet 1  . metFORMIN (GLUCOPHAGE) 500 MG tablet Take by mouth 2 (two) times daily with a meal.    . metroNIDAZOLE (FLAGYL) 500 MG tablet Take 1 tablet (500 mg total) by mouth every 8 (eight) hours. 30 tablet 0  . minocycline (MINOCIN) 100 MG capsule Take  1 capsule (100 mg total) by mouth 2 (two) times daily. 60 capsule 1  . nadolol (CORGARD) 20 MG tablet Take 1 tablet (20 mg total) by mouth 2 (two) times daily. 60 tablet 0  . ondansetron (ZOFRAN) 8 MG tablet Take 1 tablet (8 mg total) by mouth every 8 (eight) hours as needed for nausea or vomiting. 45 tablet 0  . pantoprazole (PROTONIX) 40 MG tablet Take 1 tablet (40 mg total) by mouth daily. (Patient taking differently: Take 40 mg by mouth 2 (two) times daily. ) 30 tablet 11  . prochlorperazine (COMPAZINE) 10 MG tablet Take 1 tablet (10 mg total) by mouth every 6 (six) hours as needed for nausea or vomiting. 30 tablet 1   No current facility-administered medications on file prior to visit.  ;   REVIEW OF SYSTEMS:   Constitutional: Denies fevers, chills or abnormal night sweats, (+) fatigue  Eyes: Denies blurriness of vision, double  vision or watery eyes Ears, nose, mouth, throat, and face: Denies mucositis or sore throat Respiratory: Denies cough, dyspnea or wheezes Cardiovascular: Denies palpitation, chest discomfort or lower extremity swelling Gastrointestinal: Denies nausea, heartburn or change in bowel habits Skin: Denies abnormal skin rashes Lymphatics: Denies new lymphadenopathy or easy bruising Neurological:Denies numbness, tingling or new weaknesses Behavioral/Psych: Mood is stable, no new changes, (+) insomnia All other systems were reviewed with the patient and are negative.  PHYSICAL EXAMINATION: BP 132/59 mmHg  Pulse 66  Temp(Src) 97.5 F (36.4 C) (Oral)  Resp 18  Ht _0  (1.651 m)  Wt 143 lb 14.4 oz (65.273 kg)  BMI 23.95 kg/m2  SpO2 100%  ECOG PERFORMANCE STATUS: 1 Vital sign were taken at in the infusion room, within normal limits. GENERAL:alert, no distress and comfortable SKIN: (+) Dry skin, diffuse acne like skin rashes on the face, neck and trunk, no skin ulcer or discharge. EYES: normal, conjunctiva are pink and non-injected, sclera clear OROPHARYNX:no exudate, no erythema and lips, buccal mucosa, and tongue normal  NECK: supple, thyroid normal size, non-tender, without nodularity LYMPH:  no palpable lymphadenopathy in the cervical, axillary or inguinal LUNGS: clear to auscultation and percussion with normal breathing effort, (+) crackles on b/l lung base  HEART: regular rate & rhythm and no murmurs and no lower extremity edema ABDOMEN:abdomen soft, non-tender, no hepatomegaly, no splenomegaly and normal bowel sounds Musculoskeletal:no cyanosis of digits and no clubbing  PSYCH: alert & oriented x 3 with fluent speech NEURO: no focal motor/sensory deficits  LABORATORY DATA:  I have reviewed the data as listed CBC Latest Ref Rng 09/19/2015 09/06/2015 08/23/2015  WBC 4.0 - 10.3 10e3/uL 4.8 3.7(L) 4.2  Hemoglobin 13.0 - 17.1 g/dL 11.0(L) 11.5(L) 12.3(L)  Hematocrit 38.4 - 49.9 % 33.1(L)  35.3(L) 37.3(L)  Platelets 140 - 400 10e3/uL 104(L) 93(L) 98(L)    CMP Latest Ref Rng 09/19/2015 09/06/2015 08/23/2015  Glucose 70 - 140 mg/dl 138 121 112  BUN 7.0 - 26.0 mg/dL 18.0 18.4 17.8  Creatinine 0.7 - 1.3 mg/dL 1.0 1.1 0.9  Sodium 136 - 145 mEq/L 137 139 140  Potassium 3.5 - 5.1 mEq/L 4.0 4.4 3.9  Chloride 101 - 111 mmol/L - - -  CO2 22 - 29 mEq/L _1 Calcium 8.4 - 10.4 mg/dL 9.2 9.3 8.9  Total Protein 6.4 - 8.3 g/dL 7.6 7.6 8.3  Total Bilirubin 0.20 - 1.20 mg/dL 0.97 0.84 0.86  Alkaline Phos 40 - 150 U/L 205(H) 208(H) 219(H)  AST 5 - 34 U/L 48(H) 41(H) 60(H)  ALT 0 - 55 U/L 33 31 48     INITIAL tumor markers AFP 3.2 CEA > 10,000 CA 19-9 12,929.6  Results for ANDRE, SWANDER (MRN [id number]) as of 09/06/2015 22:39  Ref. Range 05/09/2015 13:45 06/06/2015 15:06 07/05/2015 08:11 08/09/2015 08:40 09/06/2015 09:57  CA 19-9 Latest Ref Range: <35.0 U/mL 164.7 (H) 94.4 (H) 310.6 (H) 49.7 (H) 53.7 (H)  CEA Latest Ref Range: 0.0-5.0 ng/mL 249.5 (H) 90.7 (H) 386.8 (H) 96.3 (H) 73.7 (H)     Pathology report  Liver, needle/core biopsy - METASTATIC ADENOCARCINOMA, SEE COMMENT. Microscopic Comment The adenocarcinoma demonstrates the following immunophenotype: Cytokeratin 7 - negative expression. Cytokeratin 20 - strong diffuse expression. CD2 - strong diffuse expression. Overall the morphology and immunophenotype are that of metastatic adenocarcinoma primary to colorectum. The recent nuclear medicine scan demonstrating sigmoid mass with associated liver masses is noted.  [facility] test result:    RADIOGRAPHIC STUDIES: I have personally reviewed the outside CT scan image with patient and his son.   CT chest, abdomen and pelvis with IV contrast oN 08/16/2015 IMPRESSION: 1. Today's study demonstrates stable to slightly improved metastatic disease in the liver, as discussed above. 2. Interval enlargement of a 1.7 cm right adrenal nodule, suspicious for progressive  metastasis. 3. Primary lesion in the sigmoid colon appears similar to the prior study, but there is increasing colonic wall thickening throughout the descending colon. This is associated with some surrounding inflammatory changes, favored to reflect an acute colitis, although expansion of infiltrative neoplasm is difficult to entirely exclude. 4. Previously noted mildly enlarged left retroperitoneal lymph node is stable in size. No new lymphadenopathy. 5. No suspicious pulmonary nodules on today's examination. 6. Additional incidental findings, as above, similar prior studies.  EGD in [facility], [place] Grade 2 versus was found in the low cervical esophagus, they will 5 mm in largest diameter.  Mild portal hypertension gets dropsy was found in the gastric fundus One oozing cratered gastric ulcer was seen at the incisura and gastric antrum, 7X81m 2 nonbleeding superficial duodenal ulcers with no stigmata of bleeding was found in the duodenal bowel  ASSESSMENT & PLAN:  69year old [place], with past history of hypertension and dilated nonischemic gammopathy with EF 25%, no clinical signs of heart failure, who was found to have hepatitis B infection lately, and multiple liver lesions on the CT scan. He has extremely high CEA and CA 19-9 levels. PET scan reviewed a hypermetabolic sigmoid colon mass, diffuse liver metastasis, probable lung and adrenal gland metastasis.  1. Metastatic sigmoid colon cancer, with diffuse liver, lungs, node and left adrenal gland metastases. KRAS/NRAS wild type, MSI-stable -Liver biopsy showed metastatic adenocarcinoma. His tumor were strongly positive for CK20 and CD2, consistent with primary colorectal primary. KRAS and NRAS mutations were not detected.  -Pt understands that this is incurable cancer, and he has very high disease burden and overall prognosis is poor. The treatment goal is palliative -His bilirubinemia has improved after first cycle  chemotherapy.  -Giving the wild-type KRAS/NRAS genotype, he would benefit from EGFR targeted therapy. He initially started panitumumab from second cycle chemo. He had G2-3 skin rash with neutropenic fever, and was admitted for GI bleeding after second dose panitumumab. We restarted it again.  -Avastin contraindicated due to his gastric ulcer and bleeding in April 2016 and Aug 2016  -I discussed his restaging CT scan from 08/16/15, which showed stable disease overall. He is doing well and his tumor marker has come down dramatically, supporting good clinical response.  -  Due to the second episode of upper GI bleeding and ulcers, his chemo has been held. His ulcers are likely completely healed now, I recommend him to restart chemo  -Due to the possible relationship of chemotherapy and upper GI ulcer in the bleeding, I probably will change his chemotherapy treatment to [place] every 2 weeks  --Chemotherapy consent: Side effects including but does not not limited to, fatigue, nausea, vomiting, diarrhea, hair loss, neuropathy, fluid retention, renal and kidney dysfunction, neutropenic fever, needed for blood transfusion, bleeding, were discussed with patient in great detail. He agrees to proceed. -His Foundation one can only test showed EGFR amplification, no KRAS/NRAS/BRAF mutations. -I have restarted panitumumab, he tolerated well except grade 2 skin rash. We'll continue it every 2 weeks for now.  -His CEA has come down again, he is likely responding to treatment well  2. Grade 1-2 skin rashes,  -Secondary to panitumumab, near resolved now  -continue hydrocortisone 2.5%, and clindamycin gel 1%  twice daily as needed  -He knows to avoid sun exposure, and call me if it gets worse.  3. Gastric ulcer with significant GI bleeding in April and Aug 2016 -He is on PPI, continue once daily  -Repeat his EGD on 08/15/2015 showed near complete healing of his gastric ulcer -continue Nadolol 66m bid, per Dr.  SFuller Plan 4. Poorly controlled diabetes -HbA1c was 8.5, blood glucose not well controlled  -He will start insulin pump, follow up with primary care physician.  5. HTN, Dilated nonischemic ischemia cardiomyopathy with EF 25% -He is clinically doing well without symptoms of CHF. However this is probably going to impact his chemotherapy. I'll try to avoid cardiotoxic chemotherapy agent and avoid fluid overload during chemotherapy. -Continue follow-up with cardiology.  6 Hepatitis B carrier, with mild portal hypertension  -Per liver clinic, no need for treatment. Follow-up with liver clinic.  7. Malnutrition -improved -I encouraged him to eat more, and take supplements as needed. -follow up with Dietitian   8. Anemia secondary to GI bleeding and iron deficiency  -Repeat lab on 06/06/2015 showed ferritin 117, serum iron 24, saturation 8%, which supports iron deficiency -He received IV Feraheme again in Aug 2016 after GI bleeding  -Hb 11.0, slightly worse, will continue close monitoring -Repeat ferritin and iron study normal today   9. Mild abnormal LFTs -Secondary to panitumumab vs liver metastasis  -much improved, will monitor closely    10. Thrombocytopenia -Possibly related to his previous chemotherapy, although his last chemotherapy was 3 months ago -Continue monitoring  11. hypomagenemia  -secondary to panitumumab -he received IV mag 6 g with each infusion -I ask him to increse oral mag pill to 3 tab a day  -follow up closely   Plan -6th dose panitumumab today, and continue every 2 weeks -start Irinotecan today  -RTC in 2 weeks for chemo and follow up   All questions were answered. The patient knows to call the clinic with any problems, questions or concerns.  I spent 25 minutes counseling the patient face to face. The total time spent in the appointment was 30 minutes and more than 50% was on counseling.     FTruitt Merle MD 09/19/2015   8:14 PM

## 2015-09-19 NOTE — Patient Instructions (Signed)
[facility] Discharge Instructions for Patients Receiving Chemotherapy  Today you received the following chemotherapy agents Vectibix and Irinotecan. You also received Magnesium replacement. To help prevent nausea and vomiting after your treatment, we encourage you to take your nausea medication Zofran 8 mg every 8 hours as needed for Nausea.   If you develop nausea and vomiting that is not controlled by your nausea medication, call the clinic.   BELOW ARE SYMPTOMS THAT SHOULD BE REPORTED IMMEDIATELY:  *FEVER GREATER THAN 100.5 F  *CHILLS WITH OR WITHOUT FEVER  NAUSEA AND VOMITING THAT IS NOT CONTROLLED WITH YOUR NAUSEA MEDICATION  *UNUSUAL SHORTNESS OF BREATH  *UNUSUAL BRUISING OR BLEEDING  TENDERNESS IN MOUTH AND THROAT WITH OR WITHOUT PRESENCE OF ULCERS  *URINARY PROBLEMS  *BOWEL PROBLEMS  UNUSUAL RASH Items with * indicate a potential emergency and should be followed up as soon as possible.  Feel free to call the clinic you have any questions or concerns. The clinic phone number is [phone].  Please show the [facility] at check-in to the Emergency Department and triage nurse.  Panitumumab Solution for Injection What is this medicine? PANITUMUMAB (pan i TOOM ue mab) is a monoclonal antibody. It is used to treat colorectal cancer. This medicine may be used for other purposes; ask your health care provider or pharmacist if you have questions. What should I tell my health care provider before I take this medicine? They need to know if you have any of these conditions: -lung disease, especially lung fibrosis -skin conditions or sensitivity -an unusual or allergic reaction to panitumumab, mouse proteins, other medicines, foods, dyes, or preservatives -pregnant or trying to get pregnant -breast-feeding How should I use this medicine? This drug is given as an infusion into a vein. It is administered in a hospital or clinic by a specially trained  health care professional. Talk to your pediatrician regarding the use of this medicine in children. Special care may be needed. Overdosage: If you think you have taken too much of this medicine contact a poison control center or emergency room at once. NOTE: This medicine is only for you. Do not share this medicine with others. What if I miss a dose? It is important not to miss your dose. Call your doctor or health care professional if you are unable to keep an appointment. What may interact with this medicine? -some medicines for cancer This list may not describe all possible interactions. Give your health care provider a list of all the medicines, herbs, non-prescription drugs, or dietary supplements you use. Also tell them if you smoke, drink alcohol, or use illegal drugs. Some items may interact with your medicine. What should I watch for while using this medicine? Visit your doctor for checks on your progress. This drug may make you feel generally unwell. This is not uncommon, as chemotherapy can affect healthy cells as well as cancer cells. Report any side effects. Continue your course of treatment even though you feel ill unless your doctor tells you to stop. This medicine can make you more sensitive to the sun. Keep out of the sun while receiving this medicine and for 2 months after the last dose. If you cannot avoid being in the sun, wear protective clothing and use sunscreen. Do not use sun lamps or tanning beds/booths. In some cases, you may be given additional medicines to help with side effects. Follow all directions for their use. Call your doctor or health care professional for advice if you  get a fever, chills or sore throat, or other symptoms of a cold or flu. Do not treat yourself. This drug decreases your body's ability to fight infections. Try to avoid being around people who are sick. Avoid taking products that contain aspirin, acetaminophen, ibuprofen, naproxen, or ketoprofen  unless instructed by your doctor. These medicines may hide a fever. Do not become pregnant while taking this medicine and for 6 months after the last dose. Women should inform their doctor if they wish to become pregnant or think they might be pregnant. Men should not father a child while taking this medicine and for 6 months after the last dose. There is a potential for serious side effects to an unborn child. Talk to your health care professional or pharmacist for more information. Do not breast-feed an infant while taking this medicine. What side effects may I notice from receiving this medicine? Side effects that you should report to your doctor or health care professional as soon as possible: -allergic reactions like skin rash, itching or hives, swelling of the face, lips, or tongue -breathing problems -changes in vision -fast, irregular heartbeat -feeling faint or lightheaded, falls -fever, chills -mouth sores -swelling of the ankles, feet, hands -unusually weak or tired Side effects that usually do not require medical attention (report to your doctor or health care professional if they continue or are bothersome): -changes in skin like acne, cracks, skin dryness -constipation -diarrhea -eyelash growth -headache -nail changes -nausea, vomiting -stomach upset This list may not describe all possible side effects. Call your doctor for medical advice about side effects. You may report side effects to FDA at 1-800-FDA-1088. Where should I keep my medicine? This drug is given in a hospital or clinic and will not be stored at home. NOTE: This sheet is a summary. It may not cover all possible information. If you have questions about this medicine, talk to your doctor, pharmacist, or health care provider.    2016, [employer] Standard. (2014-12-13 17:21:33) Irinotecan injection What is this medicine? IRINOTECAN (ir in oh TEE kan ) is a chemotherapy drug. It is used to treat colon and  rectal cancer. This medicine may be used for other purposes; ask your health care provider or pharmacist if you have questions. What should I tell my health care provider before I take this medicine? They need to know if you have any of these conditions: -blood disorders -dehydration -diarrhea -infection (especially a virus infection such as chickenpox, cold sores, or herpes) -liver disease -low blood counts, like low white cell, platelet, or red cell counts -recent or ongoing radiation therapy -an unusual or allergic reaction to irinotecan, sorbitol, other chemotherapy, other medicines, foods, dyes, or preservatives -pregnant or trying to get pregnant -breast-feeding How should I use this medicine? This drug is given as an infusion into a vein. It is administered in a hospital or clinic by a specially trained health care professional. Talk to your pediatrician regarding the use of this medicine in children. Special care may be needed. Overdosage: If you think you have taken too much of this medicine contact a poison control center or emergency room at once. NOTE: This medicine is only for you. Do not share this medicine with others. What if I miss a dose? It is important not to miss your dose. Call your doctor or health care professional if you are unable to keep an appointment. What may interact with this medicine? Do not take this medicine with any of the following medications: -atazanavir -certain  medicines for fungal infections like itraconazole and ketoconazole -St. John's Wort This medicine may also interact with the following medications: -dexamethasone -diuretics -laxatives -medicines for seizures like carbamazepine, mephobarbital, phenobarbital, phenytoin, primidone -medicines to increase blood counts like filgrastim, pegfilgrastim, sargramostim -prochlorperazine -vaccines This list may not describe all possible interactions. Give your health care provider a list of all  the medicines, herbs, non-prescription drugs, or dietary supplements you use. Also tell them if you smoke, drink alcohol, or use illegal drugs. Some items may interact with your medicine. What should I watch for while using this medicine? Your condition will be monitored carefully while you are receiving this medicine. You will need important blood work done while you are taking this medicine. This drug may make you feel generally unwell. This is not uncommon, as chemotherapy can affect healthy cells as well as cancer cells. Report any side effects. Continue your course of treatment even though you feel ill unless your doctor tells you to stop. In some cases, you may be given additional medicines to help with side effects. Follow all directions for their use. You may get drowsy or dizzy. Do not drive, use machinery, or do anything that needs mental alertness until you know how this medicine affects you. Do not stand or sit up quickly, especially if you are an older patient. This reduces the risk of dizzy or fainting spells. Call your doctor or health care professional for advice if you get a fever, chills or sore throat, or other symptoms of a cold or flu. Do not treat yourself. This drug decreases your body's ability to fight infections. Try to avoid being around people who are sick. This medicine may increase your risk to bruise or bleed. Call your doctor or health care professional if you notice any unusual bleeding. Be careful brushing and flossing your teeth or using a toothpick because you may get an infection or bleed more easily. If you have any dental work done, tell your dentist you are receiving this medicine. Avoid taking products that contain aspirin, acetaminophen, ibuprofen, naproxen, or ketoprofen unless instructed by your doctor. These medicines may hide a fever. Do not become pregnant while taking this medicine. Women should inform their doctor if they wish to become pregnant or think  they might be pregnant. There is a potential for serious side effects to an unborn child. Talk to your health care professional or pharmacist for more information. Do not breast-feed an infant while taking this medicine. What side effects may I notice from receiving this medicine? Side effects that you should report to your doctor or health care professional as soon as possible: -allergic reactions like skin rash, itching or hives, swelling of the face, lips, or tongue -low blood counts - this medicine may decrease the number of white blood cells, red blood cells and platelets. You may be at increased risk for infections and bleeding. -signs of infection - fever or chills, cough, sore throat, pain or difficulty passing urine -signs of decreased platelets or bleeding - bruising, pinpoint red spots on the skin, black, tarry stools, blood in the urine -signs of decreased red blood cells - unusually weak or tired, fainting spells, lightheadedness -breathing problems -chest pain -diarrhea -feeling faint or lightheaded, falls -flushing, runny nose, sweating during infusion -mouth sores or pain -pain, swelling, redness or irritation where injected -pain, swelling, warmth in the leg -pain, tingling, numbness in the hands or feet -problems with balance, talking, walking -stomach cramps, pain -trouble passing urine or change in  the amount of urine -vomiting as to be unable to hold down drinks or food -yellowing of the eyes or skin Side effects that usually do not require medical attention (report to your doctor or health care professional if they continue or are bothersome): -constipation -hair loss -headache -loss of appetite -nausea, vomiting -stomach upset This list may not describe all possible side effects. Call your doctor for medical advice about side effects. You may report side effects to FDA at 1-800-FDA-1088. Where should I keep my medicine? This drug is given in a hospital or clinic  and will not be stored at home. NOTE: This sheet is a summary. It may not cover all possible information. If you have questions about this medicine, talk to your doctor, pharmacist, or health care provider.    2016, [employer] Standard. (2013-04-12 16:29:32)

## 2015-09-22 ENCOUNTER — Telehealth: Payer: Self-pay | Admitting: *Deleted

## 2015-09-22 NOTE — Telephone Encounter (Signed)
Called & left message on son's Barnabas Lister) mobile # to call back to let us know how pt is doing post chemotherapy.

## 2015-09-22 NOTE — Telephone Encounter (Signed)
-----   Message from Arna Snipe, RN sent at 09/19/2015  4:25 PM EST ----- Regarding: [facility] Follow-up Contact: [phone] Mr. Gutkowski wants [place] to call his son Blair Hailey or Barnabas Lister) to check with him. Patient started Irinotecan today along with his Vectibix.

## 2015-09-30 ENCOUNTER — Other Ambulatory Visit: Payer: Self-pay | Admitting: Hematology & Oncology

## 2015-10-03 ENCOUNTER — Other Ambulatory Visit (HOSPITAL_BASED_OUTPATIENT_CLINIC_OR_DEPARTMENT_OTHER): Payer: Medicare Other

## 2015-10-03 ENCOUNTER — Telehealth: Payer: Self-pay | Admitting: Physician Assistant

## 2015-10-03 ENCOUNTER — Other Ambulatory Visit: Payer: Self-pay | Admitting: *Deleted

## 2015-10-03 ENCOUNTER — Ambulatory Visit (HOSPITAL_BASED_OUTPATIENT_CLINIC_OR_DEPARTMENT_OTHER): Payer: Medicare Other | Admitting: Physician Assistant

## 2015-10-03 ENCOUNTER — Encounter: Payer: Self-pay | Admitting: Physician Assistant

## 2015-10-03 ENCOUNTER — Ambulatory Visit (HOSPITAL_BASED_OUTPATIENT_CLINIC_OR_DEPARTMENT_OTHER): Payer: Medicare Other

## 2015-10-03 VITALS — BP 116/71 | HR 101 | Temp 98.4°F | Resp 18 | Ht 65.0 in | Wt 136.8 lb

## 2015-10-03 DIAGNOSIS — D696 Thrombocytopenia, unspecified: Secondary | ICD-10-CM

## 2015-10-03 DIAGNOSIS — Z5112 Encounter for antineoplastic immunotherapy: Secondary | ICD-10-CM | POA: Diagnosis present

## 2015-10-03 DIAGNOSIS — I428 Other cardiomyopathies: Secondary | ICD-10-CM

## 2015-10-03 DIAGNOSIS — K766 Portal hypertension: Secondary | ICD-10-CM

## 2015-10-03 DIAGNOSIS — D709 Neutropenia, unspecified: Secondary | ICD-10-CM | POA: Diagnosis not present

## 2015-10-03 DIAGNOSIS — R739 Hyperglycemia, unspecified: Secondary | ICD-10-CM

## 2015-10-03 DIAGNOSIS — C772 Secondary and unspecified malignant neoplasm of intra-abdominal lymph nodes: Secondary | ICD-10-CM | POA: Diagnosis not present

## 2015-10-03 DIAGNOSIS — Z5111 Encounter for antineoplastic chemotherapy: Secondary | ICD-10-CM | POA: Diagnosis not present

## 2015-10-03 DIAGNOSIS — C7972 Secondary malignant neoplasm of left adrenal gland: Secondary | ICD-10-CM

## 2015-10-03 DIAGNOSIS — D500 Iron deficiency anemia secondary to blood loss (chronic): Secondary | ICD-10-CM

## 2015-10-03 DIAGNOSIS — C787 Secondary malignant neoplasm of liver and intrahepatic bile duct: Secondary | ICD-10-CM

## 2015-10-03 DIAGNOSIS — D702 Other drug-induced agranulocytosis: Secondary | ICD-10-CM

## 2015-10-03 DIAGNOSIS — D509 Iron deficiency anemia, unspecified: Secondary | ICD-10-CM

## 2015-10-03 DIAGNOSIS — C7800 Secondary malignant neoplasm of unspecified lung: Secondary | ICD-10-CM | POA: Diagnosis not present

## 2015-10-03 DIAGNOSIS — C187 Malignant neoplasm of sigmoid colon: Secondary | ICD-10-CM

## 2015-10-03 DIAGNOSIS — R945 Abnormal results of liver function studies: Secondary | ICD-10-CM | POA: Diagnosis not present

## 2015-10-03 DIAGNOSIS — E119 Type 2 diabetes mellitus without complications: Secondary | ICD-10-CM

## 2015-10-03 DIAGNOSIS — C189 Malignant neoplasm of colon, unspecified: Secondary | ICD-10-CM

## 2015-10-03 DIAGNOSIS — B1910 Unspecified viral hepatitis B without hepatic coma: Secondary | ICD-10-CM | POA: Diagnosis not present

## 2015-10-03 DIAGNOSIS — K254 Chronic or unspecified gastric ulcer with hemorrhage: Secondary | ICD-10-CM

## 2015-10-03 DIAGNOSIS — D62 Acute posthemorrhagic anemia: Secondary | ICD-10-CM

## 2015-10-03 DIAGNOSIS — E8809 Other disorders of plasma-protein metabolism, not elsewhere classified: Secondary | ICD-10-CM

## 2015-10-03 LAB — CBC WITH DIFFERENTIAL/PLATELET
BASO%: 0.5 % (ref 0.0–2.0)
BASOS ABS: 0 10*3/uL (ref 0.0–0.1)
EOS ABS: 0.2 10*3/uL (ref 0.0–0.5)
EOS%: 5.9 % (ref 0.0–7.0)
HCT: 31.4 % — ABNORMAL LOW (ref 38.4–49.9)
HGB: 10.6 g/dL — ABNORMAL LOW (ref 13.0–17.1)
LYMPH%: 34 % (ref 14.0–49.0)
MCH: 29.7 pg (ref 27.2–33.4)
MCHC: 33.8 g/dL (ref 32.0–36.0)
MCV: 88 fL (ref 79.3–98.0)
MONO#: 0.2 10*3/uL (ref 0.1–0.9)
MONO%: 8.6 % (ref 0.0–14.0)
NEUT#: 1.3 10*3/uL — ABNORMAL LOW (ref 1.5–6.5)
NEUT%: 51 % (ref 39.0–75.0)
Platelets: 144 10*3/uL (ref 140–400)
RBC: 3.57 10*6/uL — ABNORMAL LOW (ref 4.20–5.82)
RDW: 14.9 % — ABNORMAL HIGH (ref 11.0–14.6)
WBC: 2.6 10*3/uL — ABNORMAL LOW (ref 4.0–10.3)
lymph#: 0.9 10*3/uL (ref 0.9–3.3)

## 2015-10-03 LAB — FERRITIN: FERRITIN: 273 ng/mL (ref 22–316)

## 2015-10-03 LAB — COMPREHENSIVE METABOLIC PANEL
ALT: 28 U/L (ref 0–55)
ANION GAP: 9 meq/L (ref 3–11)
AST: 40 U/L — ABNORMAL HIGH (ref 5–34)
Albumin: 3.6 g/dL (ref 3.5–5.0)
Alkaline Phosphatase: 197 U/L — ABNORMAL HIGH (ref 40–150)
BILIRUBIN TOTAL: 0.78 mg/dL (ref 0.20–1.20)
BUN: 15.9 mg/dL (ref 7.0–26.0)
CALCIUM: 9.5 mg/dL (ref 8.4–10.4)
CO2: 26 meq/L (ref 22–29)
CREATININE: 0.9 mg/dL (ref 0.7–1.3)
Chloride: 105 mEq/L (ref 98–109)
EGFR: 86 mL/min/{1.73_m2} — AB (ref >90)
Glucose: 146 mg/dl — ABNORMAL HIGH (ref 70–140)
Potassium: 3.9 mEq/L (ref 3.5–5.1)
Sodium: 140 mEq/L (ref 136–145)
TOTAL PROTEIN: 8.2 g/dL (ref 6.4–8.3)

## 2015-10-03 LAB — CEA: CEA: 66.4 ng/mL — AB (ref 0.0–5.0)

## 2015-10-03 LAB — MAGNESIUM: MAGNESIUM: 0.9 mg/dL — AB (ref 1.5–2.5)

## 2015-10-03 LAB — CANCER ANTIGEN 19-9: CA 19-9: 62.3 U/mL — ABNORMAL HIGH (ref <35.0)

## 2015-10-03 MED ORDER — SODIUM CHLORIDE 0.9 % IV SOLN
Freq: Once | INTRAVENOUS | Status: AC
  Administered 2015-10-03: 14:00:00 via INTRAVENOUS
  Filled 2015-10-03: qty 4

## 2015-10-03 MED ORDER — SODIUM CHLORIDE 0.9 % IV SOLN
6.0000 g | Freq: Once | INTRAVENOUS | Status: AC
  Administered 2015-10-03: 6 g via INTRAVENOUS
  Filled 2015-10-03: qty 12

## 2015-10-03 MED ORDER — ATROPINE SULFATE 1 MG/ML IJ SOLN
INTRAMUSCULAR | Status: AC
  Filled 2015-10-03: qty 1

## 2015-10-03 MED ORDER — HEPARIN SOD (PORK) LOCK FLUSH 100 UNIT/ML IV SOLN
500.0000 [IU] | Freq: Once | INTRAVENOUS | Status: AC | PRN
  Administered 2015-10-03: 500 [IU]
  Filled 2015-10-03: qty 5

## 2015-10-03 MED ORDER — SODIUM CHLORIDE 0.9 % IV SOLN
6.2000 mg/kg | Freq: Once | INTRAVENOUS | Status: AC
  Administered 2015-10-03: 400 mg via INTRAVENOUS
  Filled 2015-10-03: qty 20

## 2015-10-03 MED ORDER — ATROPINE SULFATE 1 MG/ML IJ SOLN
0.5000 mg | Freq: Once | INTRAMUSCULAR | Status: AC | PRN
  Administered 2015-10-03: 0.5 mg via INTRAVENOUS

## 2015-10-03 MED ORDER — SODIUM CHLORIDE 0.9 % IV SOLN
Freq: Once | INTRAVENOUS | Status: AC
  Administered 2015-10-03: 10:00:00 via INTRAVENOUS

## 2015-10-03 MED ORDER — SODIUM CHLORIDE 0.9 % IJ SOLN
10.0000 mL | INTRAMUSCULAR | Status: DC | PRN
  Administered 2015-10-03: 10 mL
  Filled 2015-10-03: qty 10

## 2015-10-03 MED ORDER — IRINOTECAN HCL CHEMO INJECTION 100 MG/5ML
160.0000 mg/m2 | Freq: Once | INTRAVENOUS | Status: AC
  Administered 2015-10-03: 272 mg via INTRAVENOUS
  Filled 2015-10-03: qty 13.6

## 2015-10-03 NOTE — Progress Notes (Unsigned)
Okay to treat pt today with current lab results per [facility] note. Magnesium 0.9. Orders to replace with 6gm of IV Magnesium over 3 hrs. Pt and family understands that magnesium level needs addressed and will have to stay a little bit longer today.

## 2015-10-03 NOTE — Telephone Encounter (Signed)
per p[facility] sch already made out-gave pt copy of avs

## 2015-10-03 NOTE — Patient Instructions (Signed)
[place] Discharge Instructions for Patients Receiving Chemotherapy  Today you received the following chemotherapy agents Vectibix and Irinotecan. You also received Magnesium replacement. To help prevent nausea and vomiting after your treatment, we encourage you to take your nausea medication Zofran 8 mg every 8 hours as needed for Nausea.   If you develop nausea and vomiting that is not controlled by your nausea medication, call the clinic.   BELOW ARE SYMPTOMS THAT SHOULD BE REPORTED IMMEDIATELY:  *FEVER GREATER THAN 100.5 F  *CHILLS WITH OR WITHOUT FEVER  NAUSEA AND VOMITING THAT IS NOT CONTROLLED WITH YOUR NAUSEA MEDICATION  *UNUSUAL SHORTNESS OF BREATH  *UNUSUAL BRUISING OR BLEEDING  TENDERNESS IN MOUTH AND THROAT WITH OR WITHOUT PRESENCE OF ULCERS  *URINARY PROBLEMS  *BOWEL PROBLEMS  UNUSUAL RASH Items with * indicate a potential emergency and should be followed up as soon as possible.  Feel free to call the clinic you have any questions or concerns. The clinic phone number is [phone].  Please show the [place] at check-in to the Emergency Department and triage nurse.  Panitumumab Solution for Injection What is this medicine? PANITUMUMAB (pan i TOOM ue mab) is a monoclonal antibody. It is used to treat colorectal cancer. This medicine may be used for other purposes; ask your health care provider or pharmacist if you have questions. What should I tell my health care provider before I take this medicine? They need to know if you have any of these conditions: -lung disease, especially lung fibrosis -skin conditions or sensitivity -an unusual or allergic reaction to panitumumab, mouse proteins, other medicines, foods, dyes, or preservatives -pregnant or trying to get pregnant -breast-feeding How should I use this medicine? This drug is given as an infusion into a vein. It is administered in a hospital or clinic by a specially trained  health care professional. Talk to your pediatrician regarding the use of this medicine in children. Special care may be needed. Overdosage: If you think you have taken too much of this medicine contact a poison control center or emergency room at once. NOTE: This medicine is only for you. Do not share this medicine with others. What if I miss a dose? It is important not to miss your dose. Call your doctor or health care professional if you are unable to keep an appointment. What may interact with this medicine? -some medicines for cancer This list may not describe all possible interactions. Give your health care provider a list of all the medicines, herbs, non-prescription drugs, or dietary supplements you use. Also tell them if you smoke, drink alcohol, or use illegal drugs. Some items may interact with your medicine. What should I watch for while using this medicine? Visit your doctor for checks on your progress. This drug may make you feel generally unwell. This is not uncommon, as chemotherapy can affect healthy cells as well as cancer cells. Report any side effects. Continue your course of treatment even though you feel ill unless your doctor tells you to stop. This medicine can make you more sensitive to the sun. Keep out of the sun while receiving this medicine and for 2 months after the last dose. If you cannot avoid being in the sun, wear protective clothing and use sunscreen. Do not use sun lamps or tanning beds/booths. In some cases, you may be given additional medicines to help with side effects. Follow all directions for their use. Call your doctor or health care professional for advice if you  get a fever, chills or sore throat, or other symptoms of a cold or flu. Do not treat yourself. This drug decreases your body's ability to fight infections. Try to avoid being around people who are sick. Avoid taking products that contain aspirin, acetaminophen, ibuprofen, naproxen, or ketoprofen  unless instructed by your doctor. These medicines may hide a fever. Do not become pregnant while taking this medicine and for 6 months after the last dose. Women should inform their doctor if they wish to become pregnant or think they might be pregnant. Men should not father a child while taking this medicine and for 6 months after the last dose. There is a potential for serious side effects to an unborn child. Talk to your health care professional or pharmacist for more information. Do not breast-feed an infant while taking this medicine. What side effects may I notice from receiving this medicine? Side effects that you should report to your doctor or health care professional as soon as possible: -allergic reactions like skin rash, itching or hives, swelling of the face, lips, or tongue -breathing problems -changes in vision -fast, irregular heartbeat -feeling faint or lightheaded, falls -fever, chills -mouth sores -swelling of the ankles, feet, hands -unusually weak or tired Side effects that usually do not require medical attention (report to your doctor or health care professional if they continue or are bothersome): -changes in skin like acne, cracks, skin dryness -constipation -diarrhea -eyelash growth -headache -nail changes -nausea, vomiting -stomach upset This list may not describe all possible side effects. Call your doctor for medical advice about side effects. You may report side effects to FDA at 1-800-FDA-1088. Where should I keep my medicine? This drug is given in a hospital or clinic and will not be stored at home. NOTE: This sheet is a summary. It may not cover all possible information. If you have questions about this medicine, talk to your doctor, pharmacist, or health care provider.    2016, Elsevier/Gold Standard. (2014-12-13 17:21:33) Irinotecan injection What is this medicine? IRINOTECAN (ir in oh TEE kan ) is a chemotherapy drug. It is used to treat colon and  rectal cancer. This medicine may be used for other purposes; ask your health care provider or pharmacist if you have questions. What should I tell my health care provider before I take this medicine? They need to know if you have any of these conditions: -blood disorders -dehydration -diarrhea -infection (especially a virus infection such as chickenpox, cold sores, or herpes) -liver disease -low blood counts, like low white cell, platelet, or red cell counts -recent or ongoing radiation therapy -an unusual or allergic reaction to irinotecan, sorbitol, other chemotherapy, other medicines, foods, dyes, or preservatives -pregnant or trying to get pregnant -breast-feeding How should I use this medicine? This drug is given as an infusion into a vein. It is administered in a hospital or clinic by a specially trained health care professional. Talk to your pediatrician regarding the use of this medicine in children. Special care may be needed. Overdosage: If you think you have taken too much of this medicine contact a poison control center or emergency room at once. NOTE: This medicine is only for you. Do not share this medicine with others. What if I miss a dose? It is important not to miss your dose. Call your doctor or health care professional if you are unable to keep an appointment. What may interact with this medicine? Do not take this medicine with any of the following medications: -atazanavir -certain  medicines for fungal infections like itraconazole and ketoconazole -St. John's Wort This medicine may also interact with the following medications: -dexamethasone -diuretics -laxatives -medicines for seizures like carbamazepine, mephobarbital, phenobarbital, phenytoin, primidone -medicines to increase blood counts like filgrastim, pegfilgrastim, sargramostim -prochlorperazine -vaccines This list may not describe all possible interactions. Give your health care provider a list of all  the medicines, herbs, non-prescription drugs, or dietary supplements you use. Also tell them if you smoke, drink alcohol, or use illegal drugs. Some items may interact with your medicine. What should I watch for while using this medicine? Your condition will be monitored carefully while you are receiving this medicine. You will need important blood work done while you are taking this medicine. This drug may make you feel generally unwell. This is not uncommon, as chemotherapy can affect healthy cells as well as cancer cells. Report any side effects. Continue your course of treatment even though you feel ill unless your doctor tells you to stop. In some cases, you may be given additional medicines to help with side effects. Follow all directions for their use. You may get drowsy or dizzy. Do not drive, use machinery, or do anything that needs mental alertness until you know how this medicine affects you. Do not stand or sit up quickly, especially if you are an older patient. This reduces the risk of dizzy or fainting spells. Call your doctor or health care professional for advice if you get a fever, chills or sore throat, or other symptoms of a cold or flu. Do not treat yourself. This drug decreases your body's ability to fight infections. Try to avoid being around people who are sick. This medicine may increase your risk to bruise or bleed. Call your doctor or health care professional if you notice any unusual bleeding. Be careful brushing and flossing your teeth or using a toothpick because you may get an infection or bleed more easily. If you have any dental work done, tell your dentist you are receiving this medicine. Avoid taking products that contain aspirin, acetaminophen, ibuprofen, naproxen, or ketoprofen unless instructed by your doctor. These medicines may hide a fever. Do not become pregnant while taking this medicine. Women should inform their doctor if they wish to become pregnant or think  they might be pregnant. There is a potential for serious side effects to an unborn child. Talk to your health care professional or pharmacist for more information. Do not breast-feed an infant while taking this medicine. What side effects may I notice from receiving this medicine? Side effects that you should report to your doctor or health care professional as soon as possible: -allergic reactions like skin rash, itching or hives, swelling of the face, lips, or tongue -low blood counts - this medicine may decrease the number of white blood cells, red blood cells and platelets. You may be at increased risk for infections and bleeding. -signs of infection - fever or chills, cough, sore throat, pain or difficulty passing urine -signs of decreased platelets or bleeding - bruising, pinpoint red spots on the skin, black, tarry stools, blood in the urine -signs of decreased red blood cells - unusually weak or tired, fainting spells, lightheadedness -breathing problems -chest pain -diarrhea -feeling faint or lightheaded, falls -flushing, runny nose, sweating during infusion -mouth sores or pain -pain, swelling, redness or irritation where injected -pain, swelling, warmth in the leg -pain, tingling, numbness in the hands or feet -problems with balance, talking, walking -stomach cramps, pain -trouble passing urine or change in  the amount of urine -vomiting as to be unable to hold down drinks or food -yellowing of the eyes or skin Side effects that usually do not require medical attention (report to your doctor or health care professional if they continue or are bothersome): -constipation -hair loss -headache -loss of appetite -nausea, vomiting -stomach upset This list may not describe all possible side effects. Call your doctor for medical advice about side effects. You may report side effects to FDA at 1-800-FDA-1088. Where should I keep my medicine? This drug is given in a hospital or clinic  and will not be stored at home. NOTE: This sheet is a summary. It may not cover all possible information. If you have questions about this medicine, talk to your doctor, pharmacist, or health care provider.    2016, Elsevier/Gold Standard. (2013-04-12 16:29:32)   Hypomagnesemia Hypomagnesemia is a condition in which the level of magnesium in the blood is low. Magnesium is a mineral that is found in many foods. It is used in many different processes in the body. Hypomagnesemia can affect every organ in the body. It can cause life-threatening problems. CAUSES Causes of hypomagnesemia include:  Not getting enough magnesium in your diet.  Malnutrition.  Problems with absorbing magnesium from the intestines.  Dehydration.  Alcohol abuse.  Vomiting.  Severe diarrhea.  Some medicines, including medicines that make you urinate more.  Certain diseases, such as kidney disease, diabetes, and overactive thyroid. SIGNS AND SYMPTOMS  Involuntary shaking or trembling of a body part (tremor).  Confusion.  Muscle weakness.  Sensitivity to light, sound, and touch.  Psychiatric issues, such as depression, irritability, or psychosis.  Sudden tightening of muscles (muscle spasms).  Tingling in the arms and legs.  A feeling of fluttering of the heart. These symptoms are more severe if magnesium levels drop suddenly. DIAGNOSIS To make a diagnosis, your health care provider will do a physical exam and order blood and urine tests. TREATMENT Treatment will depend on the cause and the severity of your condition. It may involve:  A magnesium supplement. This can be taken in pill form. It can also be given through an IV tube. This is usually done if the condition is severe.  Changes to your diet. You may be directed to eat foods that have a lot of magnesium, such as green leafy vegetables, peas, beans, and nuts.  Eliminating alcohol from your diet. HOME CARE INSTRUCTIONS  Include foods  with magnesium in your diet. Foods that are rich in magnesium include green vegetables, beans, nuts and seeds, and whole grains.  Take medicines only as directed by your health care provider.  Take magnesium supplements if your health care provider instructs you to do that. Take them as directed.  Have your magnesium levels monitored as directed by your health care provider.  When you are active, drink fluids that contain electrolytes.  Keep all follow-up visits as directed by your health care provider. This is important. SEEK MEDICAL CARE IF:  You get worse instead of better.  Your symptoms return. SEEK IMMEDIATE MEDICAL CARE IF:  Your symptoms are severe.   This information is not intended to replace advice given to you by your health care provider. Make sure you discuss any questions you have with your health care provider.   Document Released: 07/10/2005 Document Revised: 11/04/2014 Document Reviewed: 05/30/2014 Elsevier Interactive Patient Education [employer].

## 2015-10-03 NOTE — Progress Notes (Signed)
[place]  [phone] [phone]  Clinic follow up Note   Patient Care Team: [name], MD as PCP - General (Specialist) [name], CRNP as Nurse Practitioner (Nurse Practitioner) Truitt Merle, MD as Consulting Physician (Hematology) Ladene Artist, MD as Consulting Physician (Gastroenterology) 10/03/2015  CHIEF COMPLAINTS Follow up metastatic sigmoid colon cancer  Oncology History   He is a Metastatic colon cancer to liver   Staging form: Colon and Rectum, AJCC 7th Edition     Clinical: Stage Unknown ([place], NX, M1) - Unsigned      Cancer of sigmoid colon ([place])   10/28/2014 Tumor Marker AFP 3.2 CEA > 10,000 CA 19-9 12,929.6. tumor (-) KRAS and NRAS mutation.    11/10/2014 Imaging PET scan showed hypermetabolic mass in the sigmoid colon was noted metastasis in the retroperitoneum. Probable left adrenal and pulmonary metastasis, and diffuse liver metastasis.   11/21/2014 Initial Diagnosis Metastatic colon cancer to liver, lung, abd nodes and left adrenal gland. Diagnosis was made by liver biopsy.    11/30/2014 -  Chemotherapy First line chemo [id number], Panitumumab added from second cycle    12/28/2014 - 12/31/2014 Hospital Admission Was admitted for dehydration, neutropenia fever with UTI, and severe skin rash.   02/08/2015 - 02/12/2015 Hospital Admission He was admitted for upper GI bleeding, e.g. showed a gastric ulcer with clots, status post appendectomy injection. He also received a blood transfusion.   03/01/2015 Tumor Marker CEA 694, CA19.9 462   03/13/2015 Imaging CT CAP showed partial response, no new lesions.    03/15/2015 - 05/23/2015 Chemotherapy restart FOLFOX, held again on 8/9 due to GI bleeding    05/31/2015 - 06/02/2015 Hospital Admission He was admitted to [facility] in [place] due to upper GI bleeding, EGD showed gastric and dudenal ulcers    06/27/2015 - 06/29/2015 Hospital Admission he was admitted for dairrhea and pancolitis, c-diff and  stool cultures were negative, treated with antibiotics   07/12/2015 -  Chemotherapy panitumumab 56m/kg, every 2 weeks     CURRENT THERAPY: panitumumab 661mkg, every 2 weeks, started on 07/12/2015, Irinotecan 18048m2 every 2 weeks added on 09/19/2015   HISTORY OF PRESENTING ILLNESS:  Jason Reilly [age]o. male is here because of abnormal CT findings, which is very suspicious for malignancy. [place]. He came in with his son and an interpreter.  He has been feeling fatigued since two month ago. He is still able to do all ADLs. He otherwise denies any pain, bloating or nausea.  He lost about 20lbs in 3 month. His appetite is lower than before, eats less, no change of his bowl habits.  She denied any hematochezia or melana. Per his son, he has had some personality changes daily, irritable, slightly confused some time.  He was evaluated by his primary care physician. Lab test reviewed hepatitis B infection, which he did not know before, and elevated alkaline phosphatase, his liver function and the rest of the liver function was not remarkable. US US abdomen was obtained on 07/22/2014, which showed diffusely abnormal liver with multiple echogenic lesions. CT of abdomen with and without contrast was done on 08/26/2014, which reviewed here at a medically with multiple large partially calcified hepatic masses consistent with metastatic disease. Mild retroperitoneal adenopathy with the largest node measuring 1.6 cm. And nonspecific 1.4 cm left adrenal nodule was also noticed. His tumor marker showed CEA greater than 10,000, CA 19-9 12,929,  AFP 3.2 (normal). He was referred to [facility] clinic and was evaluated by nurse practitioner [name]. Treatment for hepatitis B was not recommended based on his virus load.  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 He was evaluated by a cardiologist  in 2014. He denies any significant dyspnea on exertion. No leg swollen.  INTERIM HISTORY: Jason Reilly returns for follow-up and 7th cycle Vectibix. He is accompanied by his son and an interpreter to the clinic today. His skin rashes has resolved. He is doing well overall. He denies significant pain, nausea, or diarrhea. His appetite and energy level are decreased. He has lost about 7 pounds since his prior visit, which he attributes to a change in his taste buds, and has not been using  Supplements as indicated.He denies any mucositis. He denies any dysphagia.  He denies any episodes of melena or hematochezia.He denies any neuropathy. He is ambulating frequently.  MEDICAL HISTORY:  Past Medical History  Diagnosis Date  . Tachycardia   . Abnormal EKG   . Hypertension   . Non-ischemic cardiomyopathy ([place])   . Hepatitis B   . H. pylori infection   . Gastric ulcer   . Metastatic colon cancer to liver ([place])   . Diabetes mellitus without complication ([place])     metformin    SURGICAL HISTORY: Past Surgical History  Procedure Laterality Date  . Nuclear stress test  03/03/2013    High risk - consistent with nonischemic cardiomyopathy  . Left and right heart catheterization with coronary angiogram N/A 03/29/2013    Procedure: LEFT AND RIGHT HEART CATHETERIZATION WITH CORONARY ANGIOGRAM;  Surgeon: Pixie Casino, MD;  Location: [facility] CATH LAB;  Service: Cardiovascular;  Laterality: N/A;  . Esophagogastroduodenoscopy N/A 02/08/2015    Procedure: ESOPHAGOGASTRODUODENOSCOPY (EGD);  Surgeon: Ladene Artist, MD;  Location: [facility] ENDOSCOPY;  Service: Endoscopy;  Laterality: N/A;  . Esophagogastroduodenoscopy (egd) with propofol N/A 08/15/2015    Procedure: ESOPHAGOGASTRODUODENOSCOPY (EGD) WITH PROPOFOL;  Surgeon: Ladene Artist, MD;  Location: [facility] ENDOSCOPY;  Service: Endoscopy;  Laterality: N/A;    SOCIAL HISTORY: Social History   Social History  . Marital Status: Married    Spouse Name: N/A  . Number of  Children: N/A  . Years of Education: N/A   Occupational History  . Not on file.   Social History Main Topics  . Smoking status: Former Research scientist (life sciences)  . Smokeless tobacco: Never Used  . Alcohol Use: Yes     Comment: occasional  . Drug Use: No  . Sexual Activity: No   Other Topics Concern  . Not on file   Social History Narrative   Married   Enjoys walking   Has lived in [place] > 16 years    FAMILY HISTORY: No family history of liver disease or malignancy.  ALLERGIES:  has No Known Allergies.  MEDICATIONS:  Current Outpatient Prescriptions on File Prior to Visit  Medication Sig Dispense Refill  . clindamycin (CLINDAGEL) 1 % gel Apply topically 2 (two) times daily. 30 g 2  . folic acid (FOLVITE) 1 MG tablet Take 1 tablet (1 mg total) by mouth daily. 30 tablet 5  . hydrocortisone 2.5 % cream Apply topically 2 (two) times daily. 30 g 1  . lidocaine-prilocaine (EMLA) cream Apply 1 application topically as needed. Apply to [facility] cath at least one hour before needle stick. 30 g 2  . lisinopril-hydrochlorothiazide (PRINZIDE,ZESTORETIC) 10-12.5 MG tablet TAKE ONE TABLET BY MOUTH ONCE DAILY 90  tablet 0  . loperamide (IMODIUM) 2 MG capsule Take 1-2 capsules (2-4 mg total) by mouth as needed for diarrhea or loose stools. 60 capsule 5  . magnesium oxide (MAG-OX) 400 (241.3 MG) MG tablet Take 1 tablet (400 mg total) by mouth 2 (two) times daily. 60 tablet 1  . metFORMIN (GLUCOPHAGE) 500 MG tablet Take by mouth 2 (two) times daily with a meal.    . metroNIDAZOLE (FLAGYL) 500 MG tablet Take 1 tablet (500 mg total) by mouth every 8 (eight) hours. 30 tablet 0  . minocycline (MINOCIN) 100 MG capsule Take 1 capsule (100 mg total) by mouth 2 (two) times daily. 60 capsule 1  . nadolol (CORGARD) 20 MG tablet Take 1 tablet (20 mg total) by mouth 2 (two) times daily. 60 tablet 0  . ondansetron (ZOFRAN) 8 MG tablet Take 1 tablet (8 mg total) by mouth every 8 (eight) hours as needed for nausea or vomiting. 45  tablet 0  . pantoprazole (PROTONIX) 40 MG tablet Take 1 tablet (40 mg total) by mouth daily. (Patient taking differently: Take 40 mg by mouth 2 (two) times daily. ) 30 tablet 11  . prochlorperazine (COMPAZINE) 10 MG tablet Take 1 tablet (10 mg total) by mouth every 6 (six) hours as needed for nausea or vomiting. 30 tablet 1   No current facility-administered medications on file prior to visit.  ;   REVIEW OF SYSTEMS:   Constitutional: Denies fevers, chills or abnormal night sweats, (+) fatigue  Eyes: Denies blurriness of vision, double vision or watery eyes Ears, nose, mouth, throat, and face: Denies mucositis or sore throat Respiratory: Denies cough, dyspnea or wheezes Cardiovascular: Denies palpitation, chest discomfort or lower extremity swelling Gastrointestinal: Denies nausea, heartburn or change in bowel habits Skin: Denies abnormal skin rashes Lymphatics: Denies new lymphadenopathy or easy bruising Neurological:Denies numbness, tingling or new weaknesses Behavioral/Psych: Mood is stable, no new changes, (+) insomnia All other systems were reviewed with the patient and are negative.  PHYSICAL EXAMINATION: There were no vitals taken for this visit.  ECOG PERFORMANCE STATUS: 1 Vital sign were taken at in the infusion room, within normal limits. GENERAL:alert, no distress and comfortable SKIN: (+) Dry skin,  no skin ulcer, rashes or discharge. EYES: normal, conjunctiva are pink and non-injected, sclera clear OROPHARYNX:no exudate, no erythema and lips, buccal mucosa, and tongue with mild discoloration at the tip.  NECK: supple, thyroid normal size, non-tender, without nodularity LYMPH:  no palpable lymphadenopathy in the cervical, axillary or inguinal LUNGS: clear to auscultation and percussion with normal breathing effort, (+) crackles on b/l lung base  HEART: regular rate & rhythm and no murmurs and no lower extremity edema ABDOMEN:abdomen soft, non-tender, no hepatomegaly, no  splenomegaly and normal bowel sounds Musculoskeletal:no cyanosis of digits and no clubbing  PSYCH: alert & oriented x 3 with fluent speech NEURO: no focal motor/sensory deficits  LABORATORY DATA:  I have reviewed the data as listed CBC Latest Ref Rng 10/03/2015 09/19/2015 09/06/2015  WBC 4.0 - 10.3 10e3/uL 2.6(L) 4.8 3.7(L)  Hemoglobin 13.0 - 17.1 g/dL 10.6(L) 11.0(L) 11.5(L)  Hematocrit 38.4 - 49.9 % 31.4(L) 33.1(L) 35.3(L)  Platelets 140 - 400 10e3/uL 144 104(L) 93(L)   ANC is 1.3  CMP Latest Ref Rng 09/19/2015 09/06/2015 08/23/2015  Glucose 70 - 140 mg/dl 138 121 112  BUN 7.0 - 26.0 mg/dL 18.0 18.4 17.8  Creatinine 0.7 - 1.3 mg/dL 1.0 1.1 0.9  Sodium 136 - 145 mEq/L 137 139 140  Potassium 3.5 -  5.1 mEq/L 4.0 4.4 3.9  Chloride 101 - 111 mmol/L - - -  CO2 22 - 29 mEq/L _0 Calcium 8.4 - 10.4 mg/dL 9.2 9.3 8.9  Total Protein 6.4 - 8.3 g/dL 7.6 7.6 8.3  Total Bilirubin 0.20 - 1.20 mg/dL 0.97 0.84 0.86  Alkaline Phos 40 - 150 U/L 205(H) 208(H) 219(H)  AST 5 - 34 U/L 48(H) 41(H) 60(H)  ALT 0 - 55 U/L 33 31 48     INITIAL tumor markers AFP 3.2 CEA > 10,000 CA 19-9 12,929.6  Results for Jason, Reilly (MRN [id number]) as of 09/06/2015 22:39  Ref. Range 05/09/2015 13:45 06/06/2015 15:06 07/05/2015 08:11 08/09/2015 08:40 09/06/2015 09:57  CA 19-9 Latest Ref Range: <35.0 U/mL 164.7 (H) 94.4 (H) 310.6 (H) 49.7 (H) 53.7 (H)  CEA Latest Ref Range: 0.0-5.0 ng/mL 249.5 (H) 90.7 (H) 386.8 (H) 96.3 (H) 73.7 (H)     Pathology report  Liver, needle/core biopsy - METASTATIC ADENOCARCINOMA, SEE COMMENT. Microscopic Comment The adenocarcinoma demonstrates the following immunophenotype: Cytokeratin 7 - negative expression. Cytokeratin 20 - strong diffuse expression. CD2 - strong diffuse expression. Overall the morphology and immunophenotype are that of metastatic adenocarcinoma primary to colorectum. The recent nuclear medicine scan demonstrating sigmoid mass with associated liver masses is  noted.  [facility] test result:    RADIOGRAPHIC STUDIES: I have personally reviewed the outside CT scan image with patient and his son.   CT chest, abdomen and pelvis with IV contrast oN 08/16/2015 IMPRESSION: 1. Today's study demonstrates stable to slightly improved metastatic disease in the liver, as discussed above. 2. Interval enlargement of a 1.7 cm right adrenal nodule, suspicious for progressive metastasis. 3. Primary lesion in the sigmoid colon appears similar to the prior study, but there is increasing colonic wall thickening throughout the descending colon. This is associated with some surrounding inflammatory changes, favored to reflect an acute colitis, although expansion of infiltrative neoplasm is difficult to entirely exclude. 4. Previously noted mildly enlarged left retroperitoneal lymph node is stable in size. No new lymphadenopathy. 5. No suspicious pulmonary nodules on today's examination. 6. Additional incidental findings, as above, similar prior studies.  EGD in [facility], [place] Grade 2 versus was found in the low cervical esophagus, they will 5 mm in largest diameter.  Mild portal hypertension gets dropsy was found in the gastric fundus One oozing cratered gastric ulcer was seen at the incisura and gastric antrum, 7X35m 2 nonbleeding superficial duodenal ulcers with no stigmata of bleeding was found in the duodenal bowel  ASSESSMENT & PLAN:  69year old [place], with past history of hypertension and dilated nonischemic gammopathy with EF 25%, no clinical signs of heart failure, who was found to have hepatitis B infection lately, and multiple liver lesions on the CT scan. He has extremely high CEA and CA 19-9 levels. PET scan reviewed a hypermetabolic sigmoid colon mass, diffuse liver metastasis, probable lung and adrenal gland metastasis.  1. Metastatic sigmoid colon cancer, with diffuse liver, lungs, node and left adrenal gland metastases.  KRAS/NRAS wild type, MSI-stable -Liver biopsy showed metastatic adenocarcinoma. His tumor were strongly positive for CK20 and CD2, consistent with primary colorectal primary. KRAS and NRAS mutations were not detected.  -Pt understands that this is incurable cancer, and he has very high disease burden and overall prognosis is poor. The treatment goal is palliative -His bilirubinemia has improved after first cycle chemotherapy.  -Giving the wild-type KRAS/NRAS genotype, he would benefit from EGFR targeted therapy. He initially started panitumumab from  second cycle chemo. He had G2-3 skin rash with neutropenic fever, and was admitted for GI bleeding after second dose panitumumab. We restarted it again.  -Avastin contraindicated due to his gastric ulcer and bleeding in April 2016 and Aug 2016  Discussed his restaging CT scan from 08/16/15, which showed stable disease overall. He is doing well and his tumor marker has come down dramatically, supporting good clinical response.  -Due to the second episode of upper GI bleeding and ulcers, his chemo has been held. His ulcers are likely completely healed now, he restarted chemo  -Due to the possible relationship of chemotherapy and upper GI ulcer in the bleeding, his chemotherapy treatment to Irinotecan was changed to every 2 weeks  --Chemotherapy consent: Side effects including but does not not limited to, fatigue, nausea, vomiting, diarrhea, hair loss, neuropathy, fluid retention, renal and kidney dysfunction, neutropenic fever, needed for blood transfusion, bleeding, were discussed with patient in great detail. He agreed to proceed. -His Foundation one can only test showed EGFR amplification, no KRAS/NRAS/BRAF mutations. Panitumumab was restarted, he tolerated well except grade 2 skin rash which is now resolved.  Because of neutropenia, with a white count of 1.3, total white count of 2.6, it was felt prudent that his chemotherapy should be dose reduced and we  will continue to monitor We'll continue it every 2 weeks for now.  -His CEA has come down again, he is likely responding to treatment well  2. Grade 1-2 skin rashes, Results -Secondary to panitumumab,resolved now  -continue hydrocortisone 2.5%, and clindamycin gel 1%  twice daily as needed  -He knows to avoid sun exposure, and call me if it gets worse.  3. Gastric ulcer with significant GI bleeding in April and Aug 2016 -He is on PPI, continue once daily  -Repeat his EGD on 08/15/2015 showed near complete healing of his gastric ulcer -continue Nadolol 41m bid, per Dr. SFuller Plan 4. Poorly controlled diabetes -HbA1c was 8.5, blood glucose not well controlled  -He will start insulin pump, follow up with primary care physician.  5. HTN, Dilated nonischemic ischemia cardiomyopathy with EF 25% -He is clinically doing well without symptoms of CHF. However this is probably going to impact his chemotherapy.Will try to avoid cardiotoxic chemotherapy agent and avoid fluid overload during chemotherapy. -Continue follow-up with cardiology.  6 Hepatitis B carrier, with mild portal hypertension  -Per liver clinic, no need for treatment. Follow-up with liver clinic.  7. Malnutrition -I encouraged him to eat more, and take supplements as needed. -follow up with Dietitian   8. Anemia secondary to GI bleeding and iron deficiency  -Repeat lab on 06/06/2015 showed ferritin 117, serum iron 24, saturation 8%, which supports iron deficiency -He received IV Feraheme again in Aug 2016 after GI bleeding  -Hb,slightly worse, will continue close monitoring -Repeat ferritin and iron study Is currently pending  9. Mild abnormal LFTs -Secondary to panitumumab vs liver metastasis  New LFTs are pending, will monitor closely    10. Thrombocytopenia -Possibly related to his previous chemotherapy, although his last chemotherapy was 3 months ago -Continue monitoring  11. hypomagnesemia  -secondary to  panitumumab -he received IV mag 6 g with each infusion -I ask him to increse oral mag pill to 3 tab a day  -follow up closely   Neutropenia The patient's white count today is low at 1.3, with a total white count of 2.6. We will continue to monitor We reduce the dose of chemotherapy nor her to avoid further decrease in  the value.   Plan Because of neutropenia, he is chemotherapy will be dose reduced -6th dose panitumumab today, and continue every 2 weeks -start Irinotecan today  -RTC in 2 weeks for chemo and follow up  Case was discussed with [name] who agreed with the plan  All questions were answered. The patient knows to call the clinic with any problems, questions or concerns.  I spent 25 minutes counseling the patient face to face. The total time spent in the appointment was 30 minutes and more than 50% was on counseling.     Rondel Jumbo, PA-C 10/03/2015   9:04 AM

## 2015-10-04 ENCOUNTER — Telehealth: Payer: Self-pay | Admitting: *Deleted

## 2015-10-04 NOTE — Telephone Encounter (Signed)
Per staff message and POF I have scheduled appts. Advised scheduler of appts. JMW  

## 2015-10-10 ENCOUNTER — Other Ambulatory Visit: Payer: Self-pay | Admitting: Hematology

## 2015-10-10 ENCOUNTER — Ambulatory Visit (HOSPITAL_BASED_OUTPATIENT_CLINIC_OR_DEPARTMENT_OTHER): Payer: Medicare Other

## 2015-10-10 DIAGNOSIS — C787 Secondary malignant neoplasm of liver and intrahepatic bile duct: Secondary | ICD-10-CM | POA: Diagnosis not present

## 2015-10-10 DIAGNOSIS — D696 Thrombocytopenia, unspecified: Secondary | ICD-10-CM

## 2015-10-10 DIAGNOSIS — C187 Malignant neoplasm of sigmoid colon: Secondary | ICD-10-CM

## 2015-10-10 DIAGNOSIS — D500 Iron deficiency anemia secondary to blood loss (chronic): Secondary | ICD-10-CM

## 2015-10-10 LAB — BASIC METABOLIC PANEL
ANION GAP: 8 meq/L (ref 3–11)
BUN: 14.6 mg/dL (ref 7.0–26.0)
CALCIUM: 8.5 mg/dL (ref 8.4–10.4)
CHLORIDE: 103 meq/L (ref 98–109)
CO2: 23 mEq/L (ref 22–29)
CREATININE: 0.9 mg/dL (ref 0.7–1.3)
EGFR: 90 mL/min/{1.73_m2} — AB (ref >90)
Glucose: 250 mg/dl — ABNORMAL HIGH (ref 70–140)
POTASSIUM: 4.2 meq/L (ref 3.5–5.1)
Sodium: 134 mEq/L — ABNORMAL LOW (ref 136–145)

## 2015-10-10 LAB — MAGNESIUM: Magnesium: 0.9 mg/dl — CL (ref 1.5–2.5)

## 2015-10-10 MED ORDER — SODIUM CHLORIDE 0.9 % IV SOLN
6.0000 g | Freq: Once | INTRAVENOUS | Status: AC
  Administered 2015-10-10: 6 g via INTRAVENOUS
  Filled 2015-10-10: qty 12

## 2015-10-17 ENCOUNTER — Telehealth: Payer: Self-pay | Admitting: *Deleted

## 2015-10-17 ENCOUNTER — Other Ambulatory Visit: Payer: Self-pay | Admitting: *Deleted

## 2015-10-17 ENCOUNTER — Ambulatory Visit (HOSPITAL_BASED_OUTPATIENT_CLINIC_OR_DEPARTMENT_OTHER): Payer: Medicare Other | Admitting: Hematology

## 2015-10-17 ENCOUNTER — Encounter: Payer: Self-pay | Admitting: Hematology

## 2015-10-17 ENCOUNTER — Other Ambulatory Visit (HOSPITAL_BASED_OUTPATIENT_CLINIC_OR_DEPARTMENT_OTHER): Payer: Medicare Other

## 2015-10-17 ENCOUNTER — Ambulatory Visit (HOSPITAL_BASED_OUTPATIENT_CLINIC_OR_DEPARTMENT_OTHER): Payer: Medicare Other

## 2015-10-17 VITALS — BP 120/68 | HR 92 | Temp 98.5°F | Resp 18 | Ht 65.0 in | Wt 139.7 lb

## 2015-10-17 DIAGNOSIS — C787 Secondary malignant neoplasm of liver and intrahepatic bile duct: Secondary | ICD-10-CM

## 2015-10-17 DIAGNOSIS — C187 Malignant neoplasm of sigmoid colon: Secondary | ICD-10-CM

## 2015-10-17 DIAGNOSIS — C7972 Secondary malignant neoplasm of left adrenal gland: Secondary | ICD-10-CM

## 2015-10-17 DIAGNOSIS — C7800 Secondary malignant neoplasm of unspecified lung: Secondary | ICD-10-CM | POA: Diagnosis not present

## 2015-10-17 DIAGNOSIS — E119 Type 2 diabetes mellitus without complications: Secondary | ICD-10-CM | POA: Diagnosis not present

## 2015-10-17 DIAGNOSIS — C772 Secondary and unspecified malignant neoplasm of intra-abdominal lymph nodes: Secondary | ICD-10-CM | POA: Diagnosis not present

## 2015-10-17 DIAGNOSIS — Z5111 Encounter for antineoplastic chemotherapy: Secondary | ICD-10-CM

## 2015-10-17 DIAGNOSIS — D509 Iron deficiency anemia, unspecified: Secondary | ICD-10-CM

## 2015-10-17 LAB — COMPREHENSIVE METABOLIC PANEL
ALT: 27 U/L (ref 0–55)
ANION GAP: 9 meq/L (ref 3–11)
AST: 29 U/L (ref 5–34)
Albumin: 3.3 g/dL — ABNORMAL LOW (ref 3.5–5.0)
Alkaline Phosphatase: 214 U/L — ABNORMAL HIGH (ref 40–150)
BUN: 19.5 mg/dL (ref 7.0–26.0)
CALCIUM: 8.6 mg/dL (ref 8.4–10.4)
CHLORIDE: 106 meq/L (ref 98–109)
CO2: 24 mEq/L (ref 22–29)
Creatinine: 1 mg/dL (ref 0.7–1.3)
EGFR: 76 mL/min/{1.73_m2} — AB (ref >90)
Glucose: 168 mg/dl — ABNORMAL HIGH (ref 70–140)
Sodium: 140 mEq/L (ref 136–145)
Total Bilirubin: 0.53 mg/dL (ref 0.20–1.20)
Total Protein: 7.5 g/dL (ref 6.4–8.3)

## 2015-10-17 LAB — CBC WITH DIFFERENTIAL/PLATELET
BASO%: 0.2 % (ref 0.0–2.0)
Basophils Absolute: 0 10*3/uL (ref 0.0–0.1)
EOS%: 1.4 % (ref 0.0–7.0)
Eosinophils Absolute: 0.1 10*3/uL (ref 0.0–0.5)
HCT: 28.3 % — ABNORMAL LOW (ref 38.4–49.9)
HGB: 9.2 g/dL — ABNORMAL LOW (ref 13.0–17.1)
LYMPH%: 16.4 % (ref 14.0–49.0)
MCH: 29.1 pg (ref 27.2–33.4)
MCHC: 32.5 g/dL (ref 32.0–36.0)
MCV: 89.5 fL (ref 79.3–98.0)
MONO#: 0.6 10*3/uL (ref 0.1–0.9)
MONO%: 10.4 % (ref 0.0–14.0)
NEUT#: 4.5 10*3/uL (ref 1.5–6.5)
NEUT%: 71.6 % (ref 39.0–75.0)
PLATELETS: 162 10*3/uL (ref 140–400)
RBC: 3.17 10*6/uL — AB (ref 4.20–5.82)
RDW: 16 % — ABNORMAL HIGH (ref 11.0–14.6)
WBC: 6.2 10*3/uL (ref 4.0–10.3)
lymph#: 1 10*3/uL (ref 0.9–3.3)

## 2015-10-17 LAB — MAGNESIUM: Magnesium: <0.7 mg/dl — CL (ref 1.5–2.5)

## 2015-10-17 MED ORDER — FOLIC ACID 1 MG PO TABS: 1.0000 mg | ORAL_TABLET | Freq: Every day | ORAL | Status: DC

## 2015-10-17 MED ORDER — PANTOPRAZOLE SODIUM 40 MG PO TBEC: 40.0000 mg | DELAYED_RELEASE_TABLET | Freq: Every day | ORAL | Status: DC

## 2015-10-17 MED ORDER — ONDANSETRON HCL 8 MG PO TABS: 8.0000 mg | ORAL_TABLET | Freq: Three times a day (TID) | ORAL | Status: DC | PRN

## 2015-10-17 MED ORDER — NADOLOL 20 MG PO TABS: 20.0000 mg | ORAL_TABLET | Freq: Two times a day (BID) | ORAL | Status: DC

## 2015-10-17 MED ORDER — SODIUM CHLORIDE 0.9 % IV SOLN
Freq: Once | INTRAVENOUS | Status: AC
  Administered 2015-10-17: 13:00:00 via INTRAVENOUS
  Filled 2015-10-17: qty 4

## 2015-10-17 MED ORDER — HEPARIN SOD (PORK) LOCK FLUSH 100 UNIT/ML IV SOLN
500.0000 [IU] | Freq: Once | INTRAVENOUS | Status: AC | PRN
  Administered 2015-10-17: 500 [IU]
  Filled 2015-10-17: qty 5

## 2015-10-17 MED ORDER — METFORMIN HCL 500 MG PO TABS: 500.0000 mg | ORAL_TABLET | Freq: Two times a day (BID) | ORAL | Status: DC

## 2015-10-17 MED ORDER — SODIUM CHLORIDE 0.9 % IV SOLN
Freq: Once | INTRAVENOUS | Status: AC
  Administered 2015-10-17: 09:00:00 via INTRAVENOUS

## 2015-10-17 MED ORDER — IRINOTECAN HCL CHEMO INJECTION 100 MG/5ML
160.0000 mg/m2 | Freq: Once | INTRAVENOUS | Status: AC
  Administered 2015-10-17: 272 mg via INTRAVENOUS
  Filled 2015-10-17: qty 13.6

## 2015-10-17 MED ORDER — SODIUM CHLORIDE 0.9 % IJ SOLN
10.0000 mL | INTRAMUSCULAR | Status: DC | PRN
  Administered 2015-10-17: 10 mL
  Filled 2015-10-17: qty 10

## 2015-10-17 MED ORDER — LOPERAMIDE HCL 2 MG PO CAPS: 4.0000 mg | ORAL_CAPSULE | Freq: Four times a day (QID) | ORAL | Status: DC | PRN

## 2015-10-17 MED ORDER — ATROPINE SULFATE 1 MG/ML IJ SOLN
INTRAMUSCULAR | Status: AC
  Filled 2015-10-17: qty 1

## 2015-10-17 MED ORDER — ATROPINE SULFATE 1 MG/ML IJ SOLN
0.5000 mg | Freq: Once | INTRAMUSCULAR | Status: AC | PRN
  Administered 2015-10-17: 0.5 mg via INTRAVENOUS

## 2015-10-17 MED ORDER — SODIUM CHLORIDE 0.9 % IV SOLN
6.0000 g | Freq: Once | INTRAVENOUS | Status: AC
  Administered 2015-10-17: 6 g via INTRAVENOUS
  Filled 2015-10-17: qty 12

## 2015-10-17 NOTE — Patient Instructions (Signed)
[facility] Discharge Instructions for Patients Receiving Chemotherapy  Today you received the following chemotherapy agents: Irinotecan, Vectibix   To help prevent nausea and vomiting after your treatment, we encourage you to take your nausea medication as directed.    If you develop nausea and vomiting that is not controlled by your nausea medication, call the clinic.   BELOW ARE SYMPTOMS THAT SHOULD BE REPORTED IMMEDIATELY:  *FEVER GREATER THAN 100.5 F  *CHILLS WITH OR WITHOUT FEVER  NAUSEA AND VOMITING THAT IS NOT CONTROLLED WITH YOUR NAUSEA MEDICATION  *UNUSUAL SHORTNESS OF BREATH  *UNUSUAL BRUISING OR BLEEDING  TENDERNESS IN MOUTH AND THROAT WITH OR WITHOUT PRESENCE OF ULCERS  *URINARY PROBLEMS  *BOWEL PROBLEMS  UNUSUAL RASH Items with * indicate a potential emergency and should be followed up as soon as possible.  Feel free to call the clinic you have any questions or concerns. The clinic phone number is [phone].  Please show the [facility] at check-in to the Emergency Department and triage nurse.    Hypomagnesemia Hypomagnesemia is a condition in which the level of magnesium in the blood is low. Magnesium is a mineral that is found in many foods. It is used in many different processes in the body. Hypomagnesemia can affect every organ in the body. It can cause life-threatening problems. CAUSES Causes of hypomagnesemia include:  Not getting enough magnesium in your diet.  Malnutrition.  Problems with absorbing magnesium from the intestines.  Dehydration.  Alcohol abuse.  Vomiting.  Severe diarrhea.  Some medicines, including medicines that make you urinate more.  Certain diseases, such as kidney disease, diabetes, and overactive thyroid. SIGNS AND SYMPTOMS  Involuntary shaking or trembling of a body part (tremor).  Confusion.  Muscle weakness.  Sensitivity to light, sound, and touch.  Psychiatric issues, such as  depression, irritability, or psychosis.  Sudden tightening of muscles (muscle spasms).  Tingling in the arms and legs.  A feeling of fluttering of the heart. These symptoms are more severe if magnesium levels drop suddenly. DIAGNOSIS To make a diagnosis, your health care provider will do a physical exam and order blood and urine tests. TREATMENT Treatment will depend on the cause and the severity of your condition. It may involve:  A magnesium supplement. This can be taken in pill form. It can also be given through an IV tube. This is usually done if the condition is severe.  Changes to your diet. You may be directed to eat foods that have a lot of magnesium, such as green leafy vegetables, peas, beans, and nuts.  Eliminating alcohol from your diet. HOME CARE INSTRUCTIONS  Include foods with magnesium in your diet. Foods that are rich in magnesium include green vegetables, beans, nuts and seeds, and whole grains.  Take medicines only as directed by your health care provider.  Take magnesium supplements if your health care provider instructs you to do that. Take them as directed.  Have your magnesium levels monitored as directed by your health care provider.  When you are active, drink fluids that contain electrolytes.  Keep all follow-up visits as directed by your health care provider. This is important. SEEK MEDICAL CARE IF:  You get worse instead of better.  Your symptoms return. SEEK IMMEDIATE MEDICAL CARE IF:  Your symptoms are severe.   This information is not intended to replace advice given to you by your health care provider. Make sure you discuss any questions you have with your health care provider.  Document Released: 07/10/2005 Document Revised: 11/04/2014 Document Reviewed: 05/30/2014 Elsevier Interactive Patient Education [employer].

## 2015-10-17 NOTE — Progress Notes (Addendum)
[place]  [phone] [phone]  Clinic follow up Note   Patient Care Team: [name], MD as PCP - General (Specialist) [name], CRNP as Nurse Practitioner (Nurse Practitioner) Truitt Merle, MD as Consulting Physician (Hematology) Ladene Artist, MD as Consulting Physician (Gastroenterology) 10/17/2015  CHIEF COMPLAINTS Follow up metastatic sigmoid colon cancer  Oncology History   He is a Metastatic colon cancer to liver   Staging form: Colon and Rectum, AJCC 7th Edition     Clinical: Stage Unknown ([place], NX, M1) - Unsigned      Cancer of sigmoid colon ([place])   10/28/2014 Tumor Marker AFP 3.2 CEA > 10,000 CA 19-9 12,929.6. tumor (-) KRAS and NRAS mutation.    11/10/2014 Imaging PET scan showed hypermetabolic mass in the sigmoid colon was noted metastasis in the retroperitoneum. Probable left adrenal and pulmonary metastasis, and diffuse liver metastasis.   11/21/2014 Initial Diagnosis Metastatic colon cancer to liver, lung, abd nodes and left adrenal gland. Diagnosis was made by liver biopsy.    11/30/2014 -  Chemotherapy First line chemo [id number], Panitumumab added from second cycle    12/28/2014 - 12/31/2014 Hospital Admission Was admitted for dehydration, neutropenia fever with UTI, and severe skin rash.   02/08/2015 - 02/12/2015 Hospital Admission He was admitted for upper GI bleeding, e.g. showed a gastric ulcer with clots, status post appendectomy injection. He also received a blood transfusion.   03/01/2015 Tumor Marker CEA 694, CA19.9 462   03/13/2015 Imaging CT CAP showed partial response, no new lesions.    03/15/2015 - 05/23/2015 Chemotherapy restart FOLFOX, held again on 8/9 due to GI bleeding    05/31/2015 - 06/02/2015 Hospital Admission He was admitted to [facility] in [place] due to upper GI bleeding, EGD showed gastric and dudenal ulcers    06/27/2015 - 06/29/2015 Hospital Admission he was admitted for dairrhea and pancolitis, c-diff and  stool cultures were negative, treated with antibiotics   07/12/2015 -  Chemotherapy panitumumab 42m/kg, every 2 weeks     CURRENT THERAPY: panitumumab 671mkg, every 2 weeks, started on 07/12/2015, Irinotecan 18084m2 every 2 weeks added on 09/19/2015, dose decreased to 160m35m from cycle 2   HISTORY OF PRESENTING ILLNESS:  Jason Reilly 69 y54. male is here because of abnormal CT findings, which is very suspicious for malignancy. [place] on [place] from [place]. He came in with his son and an interpreter.  He has been feeling fatigued since two month ago. He is still able to do all ADLs. He otherwise denies any pain, bloating or nausea.  He lost about 20lbs in 3 month. His appetite is lower than before, eats less, no change of his bowl habits.  She denied any hematochezia or melana. Per his son, he has had some personality changes daily, irritable, slightly confused some time.  He was evaluated by his primary care physician. Lab test reviewed hepatitis B infection, which he did not know before, and elevated alkaline phosphatase, his liver function and the rest of the liver function was not remarkable. [place] was obtained on 07/22/2014, which showed diffusely abnormal liver with multiple echogenic lesions. CT of abdomen with and without contrast was done on 08/26/2014, which reviewed here at a medically with multiple large partially calcified hepatic masses consistent with metastatic disease. Mild retroperitoneal adenopathy with the largest node measuring 1.6 cm. And nonspecific 1.4 cm left adrenal nodule was also noticed. His tumor marker showed  CEA greater than 10,000, CA 19-9 12,929, AFP 3.2 (normal). He was referred to [facility] and was evaluated by nurse practitioner [name]. Treatment for hepatitis B was not recommended based on his virus load.  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 He was evaluated by a cardiologist in 2014. He denies any significant dyspnea on exertion. No leg swollen.  INTERIM HISTORY: Jason Reilly returns for follow-up and chemotherapy. He developed severe low back pain last Friday, was seen at [facility], had x-ray of lumbar spine, and was given a injection. His pain is much improved, still has mild residual pain, but able to tolerate routine activity. He also reports swollen tongue, and sensitivity in the mouth, no significant dysphagia or odynophagia. He otherwise feels well, no fever, no chills, no signs of GI bleeding.  MEDICAL HISTORY:  Past Medical History  Diagnosis Date  . Tachycardia   . Abnormal EKG   . Hypertension   . Non-ischemic cardiomyopathy ([place])   . Hepatitis B   . H. pylori infection   . Gastric ulcer   . Metastatic colon cancer to liver ([place])   . Diabetes mellitus without complication ([place])     metformin    SURGICAL HISTORY: Past Surgical History  Procedure Laterality Date  . Nuclear stress test  03/03/2013    High risk - consistent with nonischemic cardiomyopathy  . Left and right heart catheterization with coronary angiogram N/A 03/29/2013    Procedure: LEFT AND RIGHT HEART CATHETERIZATION WITH CORONARY ANGIOGRAM;  Surgeon: Pixie Casino, MD;  Location[facility] CATH LAB;  Service: Cardiovascular;  Laterality: N/A;  . Esophagogastroduodenoscopy N/A 02/08/2015    Procedure: ESOPHAGOGASTRODUODENOSCOPY (EGD);  Surgeon: Ladene Artist, MD;  Location: [facility] ENDOSCOPY;  Service: Endoscopy;  Laterality: N/A;  . Esophagogastroduodenoscopy (egd) with propofol N/A 08/15/2015    Procedure: ESOPHAGOGASTRODUODENOSCOPY (EGD) WITH PROPOFOL;  Surgeon: Ladene Artist, MD;  Location: [facility] ENDOSCOPY;  Service: Endoscopy;  Laterality: N/A;    SOCIAL HISTORY: Social History   Social History  . Marital Status: Married    Spouse Name: N/A  . Number of Children: N/A  . Years of Education: N/A   Occupational History  . Not on file.   Social History  Main Topics  . Smoking status: Former Research scientist (life sciences)  . Smokeless tobacco: Never Used  . Alcohol Use: Yes     Comment: occasional  . Drug Use: No  . Sexual Activity: No   Other Topics Concern  . Not on file   Social History Narrative   Married   Enjoys walking   Has lived in [place] > 16 years    FAMILY HISTORY: No family history of liver disease or malignancy.  ALLERGIES:  has No Known Allergies.  MEDICATIONS:  Current Outpatient Prescriptions on File Prior to Visit  Medication Sig Dispense Refill  . clindamycin (CLINDAGEL) 1 % gel Apply topically 2 (two) times daily. 30 g 2  . folic acid (FOLVITE) 1 MG tablet Take 1 tablet (1 mg total) by mouth daily. 30 tablet 5  . hydrocortisone 2.5 % cream Apply topically 2 (two) times daily. 30 g 1  . lidocaine-prilocaine (EMLA) cream Apply 1 application topically as needed. Apply to [facility] cath at least one hour before needle stick. 30 g 2  . lisinopril-hydrochlorothiazide (PRINZIDE,ZESTORETIC) 10-12.5 MG tablet TAKE ONE TABLET BY MOUTH ONCE DAILY 90 tablet 0  . loperamide (IMODIUM) 2 MG capsule Take 1-2 capsules (2-4 mg total) by mouth as  needed for diarrhea or loose stools. 60 capsule 5  . magnesium oxide (MAG-OX) 400 (241.3 MG) MG tablet Take 1 tablet (400 mg total) by mouth 2 (two) times daily. 60 tablet 1  . metFORMIN (GLUCOPHAGE) 500 MG tablet Take by mouth 2 (two) times daily with a meal.    . metroNIDAZOLE (FLAGYL) 500 MG tablet Take 1 tablet (500 mg total) by mouth every 8 (eight) hours. 30 tablet 0  . minocycline (MINOCIN) 100 MG capsule Take 1 capsule (100 mg total) by mouth 2 (two) times daily. 60 capsule 1  . nadolol (CORGARD) 20 MG tablet Take 1 tablet (20 mg total) by mouth 2 (two) times daily. 60 tablet 0  . ondansetron (ZOFRAN) 8 MG tablet Take 1 tablet (8 mg total) by mouth every 8 (eight) hours as needed for nausea or vomiting. 45 tablet 0  . pantoprazole (PROTONIX) 40 MG tablet Take 1 tablet (40 mg total) by mouth daily. (Patient  taking differently: Take 40 mg by mouth 2 (two) times daily. ) 30 tablet 11  . prochlorperazine (COMPAZINE) 10 MG tablet Take 1 tablet (10 mg total) by mouth every 6 (six) hours as needed for nausea or vomiting. 30 tablet 1   Current Facility-Administered Medications on File Prior to Visit  Medication Dose Route Frequency Provider Last Rate Last Dose  . sodium chloride 0.9 % injection 10 mL  10 mL Intracatheter PRN Truitt Merle, MD   10 mL at 10/03/15 1705  ;   REVIEW OF SYSTEMS:   Constitutional: Denies fevers, chills or abnormal night sweats, (+) fatigue  Eyes: Denies blurriness of vision, double vision or watery eyes Ears, nose, mouth, throat, and face: Denies mucositis or sore throat Respiratory: Denies cough, dyspnea or wheezes Cardiovascular: Denies palpitation, chest discomfort or lower extremity swelling Gastrointestinal: Denies nausea, heartburn or change in bowel habits Skin: Denies abnormal skin rashes Lymphatics: Denies new lymphadenopathy or easy bruising Neurological:Denies numbness, tingling or new weaknesses Behavioral/Psych: Mood is stable, no new changes, (+) insomnia All other systems were reviewed with the patient and are negative.  PHYSICAL EXAMINATION: BP 120/68 mmHg  Pulse 92  Temp(Src) 98.5 F (36.9 C) (Oral)  Resp 18  Ht _0  (1.651 m)  Wt 139 lb 11.2 oz (63.368 kg)  BMI 23.25 kg/m2  SpO2 100%  ECOG PERFORMANCE STATUS: 1 Vital sign were taken at in the infusion room, within normal limits. GENERAL:alert, no distress and comfortable SKIN: (+) Dry skin,  no skin ulcer, rashes or discharge. EYES: normal, conjunctiva are pink and non-injected, sclera clear OROPHARYNX:no exudate, no erythema and lips, buccal mucosa, and tongue with mild discoloration at the tip.  NECK: supple, thyroid normal size, non-tender, without nodularity LYMPH:  no palpable lymphadenopathy in the cervical, axillary or inguinal LUNGS: clear to auscultation and percussion with normal  breathing effort, (+) crackles on b/l lung base  HEART: regular rate & rhythm and no murmurs and no lower extremity edema ABDOMEN:abdomen soft, non-tender, no hepatomegaly, no splenomegaly and normal bowel sounds Musculoskeletal:no cyanosis of digits and no clubbing  PSYCH: alert & oriented x 3 with fluent speech NEURO: no focal motor/sensory deficits  LABORATORY DATA:  I have reviewed the data as listed CBC Latest Ref Rng 10/17/2015 10/03/2015 09/19/2015  WBC 4.0 - 10.3 10e3/uL 6.2 2.6(L) 4.8  Hemoglobin 13.0 - 17.1 g/dL 9.2(L) 10.6(L) 11.0(L)  Hematocrit 38.4 - 49.9 % 28.3(L) 31.4(L) 33.1(L)  Platelets 140 - 400 10e3/uL 162 144 104(L)   ANC is 1.3  CMP Latest Ref  Rng 10/17/2015 10/10/2015 10/03/2015  Glucose 70 - 140 mg/dl 168(H) 250(H) 146(H)  BUN 7.0 - 26.0 mg/dL 19.5 14.6 15.9  Creatinine 0.7 - 1.3 mg/dL 1.0 0.9 0.9  Sodium 136 - 145 mEq/L 140 134(L) 140  Potassium 3.5 - 5.1 mEq/L 5.5 No visable hemolysis(H) 4.2 3.9  Chloride 101 - 111 mmol/L - - -  CO2 22 - 29 mEq/L _0 Calcium 8.4 - 10.4 mg/dL 8.6 8.5 9.5  Total Protein 6.4 - 8.3 g/dL 7.5 - 8.2  Total Bilirubin 0.20 - 1.20 mg/dL 0.53 - 0.78  Alkaline Phos 40 - 150 U/L 214(H) - 197(H)  AST 5 - 34 U/L 29 - 40(H)  ALT 0 - 55 U/L 27 - 28   Mag<0/7 today   INITIAL tumor markers AFP 3.2 CEA > 10,000 CA 19-9 12,929.6  Results for Jason Reilly, Jason Reilly (MRN [id number]) as of 10/17/2015 07:30  Ref. Range 07/05/2015 08:11 08/09/2015 08:40 09/06/2015 09:57 10/03/2015 08:49  CA 19-9 Latest Ref Range: <35.0 U/mL 310.6 (H) 49.7 (H) 53.7 (H) 62.3 (H)  CEA Latest Ref Range: 0.0-5.0 ng/mL 386.8 (H) 96.3 (H) 73.7 (H) 66.4 (H)      Pathology report  Liver, needle/core biopsy - METASTATIC ADENOCARCINOMA, SEE COMMENT. Microscopic Comment The adenocarcinoma demonstrates the following immunophenotype: Cytokeratin 7 - negative expression. Cytokeratin 20 - strong diffuse expression. CD2 - strong diffuse expression. Overall the morphology and  immunophenotype are that of metastatic adenocarcinoma primary to colorectum. The recent nuclear medicine scan demonstrating sigmoid mass with associated liver masses is noted.  [facility] test result:    RADIOGRAPHIC STUDIES: I have personally reviewed the outside CT scan image with patient and his son.   CT chest, abdomen and pelvis with IV contrast oN 08/16/2015 IMPRESSION: 1. Today's study demonstrates stable to slightly improved metastatic disease in the liver, as discussed above. 2. Interval enlargement of a 1.7 cm right adrenal nodule, suspicious for progressive metastasis. 3. Primary lesion in the sigmoid colon appears similar to the prior study, but there is increasing colonic wall thickening throughout the descending colon. This is associated with some surrounding inflammatory changes, favored to reflect an acute colitis, although expansion of infiltrative neoplasm is difficult to entirely exclude. 4. Previously noted mildly enlarged left retroperitoneal lymph node is stable in size. No new lymphadenopathy. 5. No suspicious pulmonary nodules on today's examination. 6. Additional incidental findings, as above, similar prior studies.  EGD in [facility] [place] Grade 2 versus was found in the low cervical esophagus, they will 5 mm in largest diameter.  Mild portal hypertension gets dropsy was found in the gastric fundus One oozing cratered gastric ulcer was seen at the incisura and gastric antrum, 7X34m 2 nonbleeding superficial duodenal ulcers with no stigmata of bleeding was found in the duodenal bowel  ASSESSMENT & PLAN:  69year old [place], with past history of hypertension and dilated nonischemic gammopathy with EF 25%, no clinical signs of heart failure, who was found to have hepatitis B infection lately, and multiple liver lesions on the CT scan. He has extremely high CEA and CA 19-9 levels. PET scan reviewed a hypermetabolic sigmoid colon mass, diffuse  liver metastasis, probable lung and adrenal gland metastasis.  1. Metastatic sigmoid colon cancer, with diffuse liver, lungs, node and left adrenal gland metastases. KRAS/NRAS wild type, MSI-stable -Liver biopsy showed metastatic adenocarcinoma. His tumor were strongly positive for CK20 and CD2, consistent with primary colorectal primary. KRAS and NRAS mutations were not detected.  -Pt understands that this is incurable  cancer, and he has very high disease burden and overall prognosis is poor. The treatment goal is palliative -He had excellent response to first-line FOLFOX, but treatment was complicated with GI bleeding and neutropenic fever, resolved now. -he is currently on second line chemotherapy with irritecan and panitumumab, tolerating well  -His CEA has come down again, he is likely responding to treatment well  2. Grade 1-2 skin rashes, improved  -Secondary to panitumumab, improved   -continue hydrocortisone 2.5%, and clindamycin gel 1%  twice daily as needed  -He knows to avoid sun exposure, and call me if it gets worse.  3. Gastric ulcer with significant GI bleeding in April and Aug 2016 -He is on PPI, continue once daily  -Repeat his EGD on 08/15/2015 showed near complete healing of his gastric ulcer -continue Nadolol 61m bid, per Dr. SFuller Plan 4. Poorly controlled diabetes -HbA1c was 8.5, blood glucose not well controlled  -I urged him to follow-up with his primary care physician Dr. WJimmye Norman 5. HTN, Dilated nonischemic ischemia cardiomyopathy with EF 25% -He is clinically doing well without symptoms of CHF. However this is probably going to impact his chemotherapy.Will try to avoid cardiotoxic chemotherapy agent and avoid fluid overload during chemotherapy. -Continue follow-up with cardiology.  6 Hepatitis B carrier, with mild portal hypertension  -Per liver clinic, no need for treatment. Follow-up with liver clinic.  7. Malnutrition -I encouraged him to eat more, and take  supplements as needed. -follow up with Dietitian   8. Anemia secondary to GI bleeding, iron deficiency and chemo  -Repeat lab on 06/06/2015 showed ferritin 117, serum iron 24, saturation 8%, which supports iron deficiency -He received IV Feraheme again in Aug 2016 after GI bleeding  -Repeat a ferritin was 273 on 10/03/2015, much improved  -Slightly worse lately, likely secondary to chemotherapy.  11. hypomagnesemia  -secondary to panitumumab -he received IV mag 6 g with each infusion -He is taking magnesium pill 2 tablets 3 times a day, tolerating well no diarrhea. -follow up closely. Continue IV magnesium weekly  Plan -continue irinotecan today, hold panitumumab due to severe hypo-magnesium -6g iv mag today -Repeat a BMP and magnesium infusion on 12/23 (standing order) -RTC for chemo in 2 weeks, restart panitumumab if mag >0.8   All questions were answered. The patient knows to call the clinic with any problems, questions or concerns.  I spent 25 minutes counseling the patient face to face. The total time spent in the appointment was 30 minutes and more than 50% was on counseling.     FTruitt Merle MD 10/17/2015   9:27 AM

## 2015-10-17 NOTE — Telephone Encounter (Signed)
Per POF and chemo nurse I have scheduled appts

## 2015-10-17 NOTE — Progress Notes (Signed)
Reviewed labs with [name] (Magnesium <0.7, Potassium 5.5) Hold Vectibix today and pt to receive Magnesium fluids today. Pt is to report to clinic on Friday 10/20/15 to recheck Bmet and Magnesium and possible magnesium fluids on Friday. Pt and son aware and verbalizes understanding.  Copy of appointments given to pt.

## 2015-10-19 ENCOUNTER — Telehealth: Payer: Self-pay | Admitting: Hematology

## 2015-10-19 ENCOUNTER — Other Ambulatory Visit: Payer: Medicare Other

## 2015-10-19 NOTE — Telephone Encounter (Signed)
Spoke with patient via [place] interpreter [phone] re appointments for 12/23 and 1/3. Patient will get new schedule tomorrow. Two pof's sent for patient 12/20 to have 2 more q2w tx's and magnesium in 1 week and 3 week then 2nd pof sent for magnesium to be 12/22. First magnesium on schedule for tomorrow with IVF's 2nd one added for 1/10.

## 2015-10-20 ENCOUNTER — Other Ambulatory Visit (HOSPITAL_BASED_OUTPATIENT_CLINIC_OR_DEPARTMENT_OTHER): Payer: Medicare Other

## 2015-10-20 ENCOUNTER — Other Ambulatory Visit: Payer: Self-pay | Admitting: *Deleted

## 2015-10-20 ENCOUNTER — Ambulatory Visit (HOSPITAL_BASED_OUTPATIENT_CLINIC_OR_DEPARTMENT_OTHER): Payer: Medicare Other

## 2015-10-20 VITALS — BP 98/56 | HR 102 | Temp 97.8°F | Resp 16

## 2015-10-20 DIAGNOSIS — C187 Malignant neoplasm of sigmoid colon: Secondary | ICD-10-CM

## 2015-10-20 DIAGNOSIS — E875 Hyperkalemia: Secondary | ICD-10-CM

## 2015-10-20 DIAGNOSIS — D509 Iron deficiency anemia, unspecified: Secondary | ICD-10-CM

## 2015-10-20 LAB — CBC WITH DIFFERENTIAL/PLATELET
BASO%: 0.4 % (ref 0.0–2.0)
BASOS ABS: 0 10*3/uL (ref 0.0–0.1)
EOS ABS: 0.2 10*3/uL (ref 0.0–0.5)
EOS%: 4.7 % (ref 0.0–7.0)
HCT: 30.8 % — ABNORMAL LOW (ref 38.4–49.9)
HGB: 10.2 g/dL — ABNORMAL LOW (ref 13.0–17.1)
LYMPH%: 26.9 % (ref 14.0–49.0)
MCH: 29.3 pg (ref 27.2–33.4)
MCHC: 33.2 g/dL (ref 32.0–36.0)
MCV: 88.2 fL (ref 79.3–98.0)
MONO#: 0.1 10*3/uL (ref 0.1–0.9)
MONO%: 2.3 % (ref 0.0–14.0)
NEUT#: 3.4 10*3/uL (ref 1.5–6.5)
NEUT%: 65.7 % (ref 39.0–75.0)
Platelets: 261 10*3/uL (ref 140–400)
RBC: 3.49 10*6/uL — AB (ref 4.20–5.82)
RDW: 15.9 % — AB (ref 11.0–14.6)
WBC: 5.1 10*3/uL (ref 4.0–10.3)
lymph#: 1.4 10*3/uL (ref 0.9–3.3)

## 2015-10-20 LAB — BASIC METABOLIC PANEL
Anion Gap: 13 mEq/L — ABNORMAL HIGH (ref 3–11)
BUN: 18.7 mg/dL (ref 7.0–26.0)
CHLORIDE: 107 meq/L (ref 98–109)
CO2: 22 mEq/L (ref 22–29)
CREATININE: 0.9 mg/dL (ref 0.7–1.3)
Calcium: 9.2 mg/dL (ref 8.4–10.4)
EGFR: 83 mL/min/{1.73_m2} — ABNORMAL LOW (ref >90)
Glucose: 122 mg/dl (ref 70–140)
Potassium: 3.6 mEq/L (ref 3.5–5.1)
Sodium: 141 mEq/L (ref 136–145)

## 2015-10-20 LAB — MAGNESIUM: MAGNESIUM: 1 mg/dL — AB (ref 1.5–2.5)

## 2015-10-20 MED ORDER — SODIUM CHLORIDE 0.9 % IV SOLN
Freq: Once | INTRAVENOUS | Status: AC
  Administered 2015-10-20: 14:00:00 via INTRAVENOUS
  Filled 2015-10-20: qty 500

## 2015-10-20 MED ORDER — OXYCODONE-ACETAMINOPHEN 5-325 MG PO TABS
ORAL_TABLET | ORAL | Status: AC
  Filled 2015-10-20: qty 1

## 2015-10-20 MED ORDER — SODIUM CHLORIDE 0.9 % IV SOLN: 6.0000 g | Freq: Once | INTRAVENOUS | Status: DC

## 2015-10-20 MED ORDER — HEPARIN SOD (PORK) LOCK FLUSH 100 UNIT/ML IV SOLN
500.0000 [IU] | Freq: Once | INTRAVENOUS | Status: AC | PRN
  Administered 2015-10-20: 500 [IU]
  Filled 2015-10-20: qty 5

## 2015-10-20 MED ORDER — OXYCODONE-ACETAMINOPHEN 5-325 MG PO TABS
1.0000 | ORAL_TABLET | Freq: Once | ORAL | Status: AC
  Administered 2015-10-20: 1 via ORAL

## 2015-10-20 MED ORDER — SODIUM CHLORIDE 0.9 % IJ SOLN
10.0000 mL | INTRAMUSCULAR | Status: DC | PRN
  Administered 2015-10-20: 10 mL
  Filled 2015-10-20: qty 10

## 2015-10-20 MED ORDER — SODIUM CHLORIDE 0.9 % IV SOLN
Freq: Once | INTRAVENOUS | Status: AC
  Administered 2015-10-20: 14:00:00 via INTRAVENOUS

## 2015-10-20 NOTE — Addendum Note (Signed)
Addended by: Truitt Merle on: 10/20/2015 08:17 AM   Modules accepted: Orders

## 2015-10-20 NOTE — Patient Instructions (Signed)
Hypomagnesemia °Hypomagnesemia is a condition in which the level of magnesium in the blood is low. Magnesium is a mineral that is found in many foods. It is used in many different processes in the body. Hypomagnesemia can affect every organ in the body. It can cause life-threatening problems. °CAUSES °Causes of hypomagnesemia include: °· Not getting enough magnesium in your diet. °· Malnutrition. °· Problems with absorbing magnesium from the intestines. °· Dehydration. °· Alcohol abuse. °· Vomiting. °· Severe diarrhea. °· Some medicines, including medicines that make you urinate more. °· Certain diseases, such as kidney disease, diabetes, and overactive thyroid. °SIGNS AND SYMPTOMS °· Involuntary shaking or trembling of a body part (tremor). °· Confusion. °· Muscle weakness. °· Sensitivity to light, sound, and touch. °· Psychiatric issues, such as depression, irritability, or psychosis. °· Sudden tightening of muscles (muscle spasms). °· Tingling in the arms and legs. °· A feeling of fluttering of the heart. °These symptoms are more severe if magnesium levels drop suddenly. °DIAGNOSIS °To make a diagnosis, your health care provider will do a physical exam and order blood and urine tests. °TREATMENT °Treatment will depend on the cause and the severity of your condition. It may involve: °· A magnesium supplement. This can be taken in pill form. It can also be given through an IV tube. This is usually done if the condition is severe. °· Changes to your diet. You may be directed to eat foods that have a lot of magnesium, such as green leafy vegetables, peas, beans, and nuts. °· Eliminating alcohol from your diet. °HOME CARE INSTRUCTIONS °· Include foods with magnesium in your diet. Foods that are rich in magnesium include green vegetables, beans, nuts and seeds, and whole grains. °· Take medicines only as directed by your health care provider. °· Take magnesium supplements if your health care provider instructs you to  do that. Take them as directed. °· Have your magnesium levels monitored as directed by your health care provider. °· When you are active, drink fluids that contain electrolytes. °· Keep all follow-up visits as directed by your health care provider. This is important. °SEEK MEDICAL CARE IF: °· You get worse instead of better. °· Your symptoms return. °SEEK IMMEDIATE MEDICAL CARE IF: °· Your symptoms are severe. °  °This information is not intended to replace advice given to you by your health care provider. Make sure you discuss any questions you have with your health care provider. °  °Document Released: 07/10/2005 Document Revised: 11/04/2014 Document Reviewed: 05/30/2014 °Elsevier Interactive Patient Education ©2016 [employer] ° °

## 2015-10-31 ENCOUNTER — Encounter: Payer: Self-pay | Admitting: Physician Assistant

## 2015-10-31 ENCOUNTER — Other Ambulatory Visit: Payer: Self-pay | Admitting: Hematology

## 2015-10-31 ENCOUNTER — Ambulatory Visit (HOSPITAL_BASED_OUTPATIENT_CLINIC_OR_DEPARTMENT_OTHER): Payer: Medicare Other | Admitting: Physician Assistant

## 2015-10-31 ENCOUNTER — Other Ambulatory Visit: Payer: Self-pay | Admitting: Physician Assistant

## 2015-10-31 ENCOUNTER — Other Ambulatory Visit (HOSPITAL_BASED_OUTPATIENT_CLINIC_OR_DEPARTMENT_OTHER): Payer: PPO

## 2015-10-31 ENCOUNTER — Ambulatory Visit (HOSPITAL_BASED_OUTPATIENT_CLINIC_OR_DEPARTMENT_OTHER): Payer: PPO

## 2015-10-31 VITALS — BP 118/56 | HR 84 | Temp 98.4°F | Resp 18 | Ht 65.0 in | Wt 138.4 lb

## 2015-10-31 VITALS — BP 135/52 | HR 84

## 2015-10-31 DIAGNOSIS — C787 Secondary malignant neoplasm of liver and intrahepatic bile duct: Secondary | ICD-10-CM

## 2015-10-31 DIAGNOSIS — C772 Secondary and unspecified malignant neoplasm of intra-abdominal lymph nodes: Secondary | ICD-10-CM | POA: Diagnosis not present

## 2015-10-31 DIAGNOSIS — C187 Malignant neoplasm of sigmoid colon: Secondary | ICD-10-CM

## 2015-10-31 DIAGNOSIS — E8809 Other disorders of plasma-protein metabolism, not elsewhere classified: Secondary | ICD-10-CM

## 2015-10-31 DIAGNOSIS — R531 Weakness: Secondary | ICD-10-CM

## 2015-10-31 DIAGNOSIS — C189 Malignant neoplasm of colon, unspecified: Secondary | ICD-10-CM

## 2015-10-31 DIAGNOSIS — E119 Type 2 diabetes mellitus without complications: Secondary | ICD-10-CM

## 2015-10-31 DIAGNOSIS — R21 Rash and other nonspecific skin eruption: Secondary | ICD-10-CM

## 2015-10-31 DIAGNOSIS — D6481 Anemia due to antineoplastic chemotherapy: Secondary | ICD-10-CM | POA: Diagnosis not present

## 2015-10-31 DIAGNOSIS — D62 Acute posthemorrhagic anemia: Secondary | ICD-10-CM

## 2015-10-31 DIAGNOSIS — I428 Other cardiomyopathies: Secondary | ICD-10-CM

## 2015-10-31 DIAGNOSIS — C7800 Secondary malignant neoplasm of unspecified lung: Secondary | ICD-10-CM

## 2015-10-31 DIAGNOSIS — Z5112 Encounter for antineoplastic immunotherapy: Secondary | ICD-10-CM

## 2015-10-31 DIAGNOSIS — R739 Hyperglycemia, unspecified: Secondary | ICD-10-CM

## 2015-10-31 DIAGNOSIS — Z5111 Encounter for antineoplastic chemotherapy: Secondary | ICD-10-CM

## 2015-10-31 DIAGNOSIS — C7972 Secondary malignant neoplasm of left adrenal gland: Secondary | ICD-10-CM

## 2015-10-31 DIAGNOSIS — E875 Hyperkalemia: Secondary | ICD-10-CM

## 2015-10-31 DIAGNOSIS — D500 Iron deficiency anemia secondary to blood loss (chronic): Secondary | ICD-10-CM

## 2015-10-31 DIAGNOSIS — E860 Dehydration: Secondary | ICD-10-CM

## 2015-10-31 DIAGNOSIS — R112 Nausea with vomiting, unspecified: Secondary | ICD-10-CM

## 2015-10-31 DIAGNOSIS — D702 Other drug-induced agranulocytosis: Secondary | ICD-10-CM

## 2015-10-31 DIAGNOSIS — D509 Iron deficiency anemia, unspecified: Secondary | ICD-10-CM

## 2015-10-31 LAB — COMPREHENSIVE METABOLIC PANEL
ALK PHOS: 164 U/L — AB (ref 40–150)
ALT: 20 U/L (ref 0–55)
ANION GAP: 7 meq/L (ref 3–11)
AST: 24 U/L (ref 5–34)
Albumin: 3.5 g/dL (ref 3.5–5.0)
BUN: 12.6 mg/dL (ref 7.0–26.0)
CO2: 24 meq/L (ref 22–29)
Calcium: 9.3 mg/dL (ref 8.4–10.4)
Chloride: 106 mEq/L (ref 98–109)
Creatinine: 1.1 mg/dL (ref 0.7–1.3)
EGFR: 69 mL/min/{1.73_m2} — AB (ref >90)
GLUCOSE: 133 mg/dL (ref 70–140)
POTASSIUM: 5 meq/L (ref 3.5–5.1)
SODIUM: 137 meq/L (ref 136–145)
Total Bilirubin: 0.69 mg/dL (ref 0.20–1.20)
Total Protein: 7.5 g/dL (ref 6.4–8.3)

## 2015-10-31 LAB — CBC WITH DIFFERENTIAL/PLATELET
BASO%: 0.3 % (ref 0.0–2.0)
Basophils Absolute: 0 10e3/uL (ref 0.0–0.1)
EOS%: 6.6 % (ref 0.0–7.0)
Eosinophils Absolute: 0.4 10e3/uL (ref 0.0–0.5)
HCT: 27.1 % — ABNORMAL LOW (ref 38.4–49.9)
HGB: 9.1 g/dL — ABNORMAL LOW (ref 13.0–17.1)
LYMPH%: 16.5 % (ref 14.0–49.0)
MCH: 29.9 pg (ref 27.2–33.4)
MCHC: 33.5 g/dL (ref 32.0–36.0)
MCV: 89 fL (ref 79.3–98.0)
MONO#: 0.5 10e3/uL (ref 0.1–0.9)
MONO%: 8.7 % (ref 0.0–14.0)
NEUT#: 4 10e3/uL (ref 1.5–6.5)
NEUT%: 67.9 % (ref 39.0–75.0)
Platelets: 262 10e3/uL (ref 140–400)
RBC: 3.05 10e6/uL — ABNORMAL LOW (ref 4.20–5.82)
RDW: 16.7 % — ABNORMAL HIGH (ref 11.0–14.6)
WBC: 5.9 10e3/uL (ref 4.0–10.3)
lymph#: 1 10e3/uL (ref 0.9–3.3)

## 2015-10-31 LAB — CANCER ANTIGEN 19-9: CA 19-9: 40.7 U/mL — ABNORMAL HIGH (ref <35.0)

## 2015-10-31 LAB — CEA: CEA: 50.6 ng/mL — AB (ref 0.0–5.0)

## 2015-10-31 LAB — FERRITIN: FERRITIN: 178 ng/mL (ref 22–316)

## 2015-10-31 LAB — MAGNESIUM: Magnesium: 1.5 mg/dl (ref 1.5–2.5)

## 2015-10-31 MED ORDER — HEPARIN SOD (PORK) LOCK FLUSH 100 UNIT/ML IV SOLN
500.0000 [IU] | Freq: Once | INTRAVENOUS | Status: AC | PRN
  Administered 2015-10-31: 500 [IU]
  Filled 2015-10-31: qty 5

## 2015-10-31 MED ORDER — ATROPINE SULFATE 1 MG/ML IJ SOLN
INTRAMUSCULAR | Status: AC
  Filled 2015-10-31: qty 1

## 2015-10-31 MED ORDER — SODIUM CHLORIDE 0.9 % IV SOLN
Freq: Once | INTRAVENOUS | Status: AC
  Administered 2015-10-31: 09:00:00 via INTRAVENOUS

## 2015-10-31 MED ORDER — IRINOTECAN HCL CHEMO INJECTION 100 MG/5ML
160.0000 mg/m2 | Freq: Once | INTRAVENOUS | Status: AC
  Administered 2015-10-31: 272 mg via INTRAVENOUS
  Filled 2015-10-31: qty 13.6

## 2015-10-31 MED ORDER — SODIUM CHLORIDE 0.9 % IV SOLN
Freq: Once | INTRAVENOUS | Status: AC
  Administered 2015-10-31: 09:00:00 via INTRAVENOUS
  Filled 2015-10-31: qty 4

## 2015-10-31 MED ORDER — SODIUM CHLORIDE 0.9 % IV SOLN
2.0000 g | Freq: Once | INTRAVENOUS | Status: AC
  Administered 2015-10-31: 2 g via INTRAVENOUS
  Filled 2015-10-31: qty 4

## 2015-10-31 MED ORDER — MAGIC MOUTHWASH: 5.0000 mL | Freq: Four times a day (QID) | ORAL | Status: AC

## 2015-10-31 MED ORDER — PROCHLORPERAZINE MALEATE 10 MG PO TABS: 10.0000 mg | ORAL_TABLET | Freq: Four times a day (QID) | ORAL | Status: DC | PRN

## 2015-10-31 MED ORDER — ATROPINE SULFATE 1 MG/ML IJ SOLN
0.5000 mg | Freq: Once | INTRAMUSCULAR | Status: AC | PRN
  Administered 2015-10-31: 0.5 mg via INTRAVENOUS

## 2015-10-31 MED ORDER — SODIUM CHLORIDE 0.9 % IV SOLN
6.2000 mg/kg | Freq: Once | INTRAVENOUS | Status: AC
  Administered 2015-10-31: 400 mg via INTRAVENOUS
  Filled 2015-10-31: qty 20

## 2015-10-31 MED ORDER — SODIUM CHLORIDE 0.9 % IJ SOLN
10.0000 mL | INTRAMUSCULAR | Status: DC | PRN
  Administered 2015-10-31: 10 mL
  Filled 2015-10-31: qty 10

## 2015-10-31 NOTE — Progress Notes (Signed)
Jason Reilly  [phone]) [phone] [phone]) [phone]  Clinic follow up Note   Patient Care Team: [name], MD as PCP - General (Specialist) [name], CRNP as Nurse Practitioner (Nurse Practitioner) Truitt Merle, MD as Consulting Physician (Hematology) Ladene Artist, MD as Consulting Physician (Gastroenterology) 10/31/2015  CHIEF COMPLAINTS Follow up metastatic sigmoid colon cancer  Oncology History   He is a Metastatic colon cancer to liver   Staging form: Colon and Rectum, AJCC 7th Edition     Clinical: Stage Unknown ([place], NX, M1) - Unsigned      Cancer of sigmoid colon ([place])   10/28/2014 Tumor Marker AFP 3.2 CEA > 10,000 CA 19-9 12,929.6. tumor (-) KRAS and NRAS mutation.    11/10/2014 Imaging PET scan showed hypermetabolic mass in the sigmoid colon was noted metastasis in the retroperitoneum. Probable left adrenal and pulmonary metastasis, and diffuse liver metastasis.   11/21/2014 Initial Diagnosis Metastatic colon cancer to liver, lung, abd nodes and left adrenal gland. Diagnosis was made by liver biopsy.    11/30/2014 -  Chemotherapy First line chemo [id number], Panitumumab added from second cycle    12/28/2014 - 12/31/2014 Hospital Admission Was admitted for dehydration, neutropenia fever with UTI, and severe skin rash.   02/08/2015 - 02/12/2015 Hospital Admission He was admitted for upper GI bleeding, e.g. showed a gastric ulcer with clots, status post appendectomy injection. He also received a blood transfusion.   03/01/2015 Tumor Marker CEA 694, CA19.9 462   03/13/2015 Imaging CT CAP showed partial response, no new lesions.    03/15/2015 - 05/23/2015 Chemotherapy restart FOLFOX, held again on 8/9 due to GI bleeding    05/31/2015 - 06/02/2015 Hospital Admission He was admitted to [facility] in [place] due to upper GI bleeding, EGD showed gastric and dudenal ulcers    06/27/2015 - 06/29/2015 Hospital Admission he was admitted for dairrhea and pancolitis, c-diff and  stool cultures were negative, treated with antibiotics   07/12/2015 -  Chemotherapy panitumumab 40m/kg, every 2 weeks     CURRENT THERAPY: panitumumab 695mkg, every 2 weeks, started on 07/12/2015, Irinotecan 18036m2 every 2 weeks added on 09/19/2015, dose decreased to 160m4m from cycle 2   HISTORY OF PRESENTING ILLNESS:  Jason Reilly 69 y41. male is here because of abnormal CT findings, which is very suspicious for malignancy. He is on [place] from [place]. He came in with his son and an interpreter.  He has been feeling fatigued since two month ago. He is still able to do all ADLs. He otherwise denies any pain, bloating or nausea.  He lost about 20lbs in 3 month. His appetite is lower than before, eats less, no change of his bowl habits.  She denied any hematochezia or melana. Per his son, he has had some personality changes daily, irritable, slightly confused some time.  He was evaluated by his primary care physician. Lab test reviewed hepatitis B infection, which he did not know before, and elevated alkaline phosphatase, his liver function and the rest of the liver function was not remarkable. [place] was obtained on 07/22/2014, which showed diffusely abnormal liver with multiple echogenic lesions. CT of abdomen with and without contrast was done on 08/26/2014, which reviewed here at a medically with multiple large partially calcified hepatic masses consistent with metastatic disease. Mild retroperitoneal adenopathy with the largest node measuring 1.6 cm. And nonspecific 1.4 cm left adrenal nodule was also noticed. His tumor marker showed  CEA greater than 10,000, CA 19-9 12,929, AFP 3.2 (normal). He was referred to [facility] and was evaluated by nurse practitioner [name]. Treatment for hepatitis B was not recommended based on his virus load.  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 He was evaluated by a cardiologist in 2014. He denies any significant dyspnea on exertion. No leg swollen.  INTERIM HISTORY: Hance returns for follow-up and chemotherapy. His pain is much improved, still has mild residual pain, but able to tolerate routine activity. He also has intermittent left leg pain, none currently. He denies any leg swelling.  He also reports some sensitivity in the mouth, with one area of sore at the tip of the tongue no significant dysphagia or odynophagia. He otherwise feels well, no fever, no chills, no chest pain or shortness of breath, no signs of GI bleeding.  MEDICAL HISTORY:  Past Medical History  Diagnosis Date  . Tachycardia   . Abnormal EKG   . Hypertension   . Non-ischemic cardiomyopathy ([place])   . Hepatitis B   . H. pylori infection   . Gastric ulcer   . Metastatic colon cancer to liver ([place])   . Diabetes mellitus without complication ([place])     metformin    SURGICAL HISTORY: Past Surgical History  Procedure Laterality Date  . Nuclear stress test  03/03/2013    High risk - consistent with nonischemic cardiomyopathy  . Left and right heart catheterization with coronary angiogram N/A 03/29/2013    Procedure: LEFT AND RIGHT HEART CATHETERIZATION WITH CORONARY ANGIOGRAM;  Surgeon: Pixie Casino, MD;  Location: [facility] CATH LAB;  Service: Cardiovascular;  Laterality: N/A;  . Esophagogastroduodenoscopy N/A 02/08/2015    Procedure: ESOPHAGOGASTRODUODENOSCOPY (EGD);  Surgeon: Ladene Artist, MD;  Location: [facility] ENDOSCOPY;  Service: Endoscopy;  Laterality: N/A;  . Esophagogastroduodenoscopy (egd) with propofol N/A 08/15/2015    Procedure: ESOPHAGOGASTRODUODENOSCOPY (EGD) WITH PROPOFOL;  Surgeon: Ladene Artist, MD;  Location: [facility] ENDOSCOPY;  Service: Endoscopy;  Laterality: N/A;    SOCIAL HISTORY: Social History   Social History  . Marital Status: Married    Spouse Name: N/A  . Number of Children: N/A  . Years of Education: N/A   Occupational History    . Not on file.   Social History Main Topics  . Smoking status: Former Research scientist (life sciences)  . Smokeless tobacco: Never Used  . Alcohol Use: Yes     Comment: occasional  . Drug Use: No  . Sexual Activity: No   Other Topics Concern  . Not on file   Social History Narrative   Married   Enjoys walking   Has lived in [place] > 16 years    FAMILY HISTORY: No family history of liver disease or malignancy.  ALLERGIES:  has No Known Allergies.  MEDICATIONS:  Current Outpatient Prescriptions on File Prior to Visit  Medication Sig Dispense Refill  . clindamycin (CLINDAGEL) 1 % gel Apply topically 2 (two) times daily. 30 g 2  . folic acid (FOLVITE) 1 MG tablet Take 1 tablet (1 mg total) by mouth daily. 30 tablet 3  . hydrocortisone 2.5 % cream Apply topically 2 (two) times daily. 30 g 1  . lidocaine-prilocaine (EMLA) cream Apply 1 application topically as needed. Apply to [facility] cath at least one hour before needle stick. 30 g 2  . lisinopril-hydrochlorothiazide (PRINZIDE,ZESTORETIC) 10-12.5 MG tablet TAKE ONE TABLET BY MOUTH ONCE DAILY 90 tablet 0  . loperamide (IMODIUM) 2 MG  capsule Take 2 capsules (4 mg total) by mouth 4 (four) times daily as needed for diarrhea or loose stools (NO MORE THAN 8 TABLETS PER DAY.). 60 capsule 3  . magnesium oxide (MAG-OX) 400 (241.3 MG) MG tablet Take 1 tablet (400 mg total) by mouth 2 (two) times daily. 60 tablet 1  . metFORMIN (GLUCOPHAGE) 500 MG tablet Take 1 tablet (500 mg total) by mouth 2 (two) times daily with a meal. THIS IS A ONE TIME ORDER.  FUTURE REFILLS NEED TO BE DONE BY PRIMARY MD. 90 tablet 0  . metroNIDAZOLE (FLAGYL) 500 MG tablet Take 1 tablet (500 mg total) by mouth every 8 (eight) hours. 30 tablet 0  . minocycline (MINOCIN) 100 MG capsule Take 1 capsule (100 mg total) by mouth 2 (two) times daily. 60 capsule 1  . nadolol (CORGARD) 20 MG tablet Take 1 tablet (20 mg total) by mouth 2 (two) times daily. THIS IS A ONE TIME ORDER.  FUTURE REFILLS NEED TO BE  DONE BY PRIMARY MD. 90 tablet 0  . ondansetron (ZOFRAN) 8 MG tablet Take 1 tablet (8 mg total) by mouth every 8 (eight) hours as needed for nausea or vomiting. 45 tablet 2  . pantoprazole (PROTONIX) 40 MG tablet Take 1 tablet (40 mg total) by mouth daily. 90 tablet 1   Current Facility-Administered Medications on File Prior to Visit  Medication Dose Route Frequency Provider Last Rate Last Dose  . sodium chloride 0.9 % injection 10 mL  10 mL Intracatheter PRN Truitt Merle, MD   10 mL at 10/03/15 1705  ;   REVIEW OF SYSTEMS:   Constitutional: Denies fevers, chills or abnormal night sweats, (+) fatigue  Eyes: Denies blurriness of vision, double vision or watery eyes Ears, nose, mouth, throat, and face: Denies mucositis or sore throat Respiratory: Denies cough, dyspnea or wheezes Cardiovascular: Denies palpitation, chest discomfort or lower extremity swelling Gastrointestinal: Denies nausea, heartburn or change in bowel habits. He reports a sore at the tip of his tongue Skin: Denies abnormal skin rashes Lymphatics: Denies new lymphadenopathy or easy bruising Neurological:Denies numbness, tingling or new weaknesses Behavioral/Psych: Mood is stable, no new changes, (+) insomnia All other systems were reviewed with the patient and are negative.  PHYSICAL EXAMINATION: BP 118/56 mmHg  Pulse 84  Temp(Src) 98.4 F (36.9 C) (Oral)  Resp 18  Ht '5\' 5"'  (1.651 m)  Wt 138 lb 6.4 oz (62.778 kg)  BMI 23.03 kg/m2  SpO2 100%  ECOG PERFORMANCE STATUS: 1  GENERAL:alert, no distress and comfortable SKIN: (+) Dry skin,  no skin ulcer, rashes or discharge. EYES: normal, conjunctiva are pink and non-injected, sclera clear OROPHARYNX:no exudate, no erythema and lips, buccal mucosa, and tongue with mild discoloration at the tip, and mild ulceration in the area. No thrush NECK: supple, thyroid normal size, non-tender, without nodularity LYMPH:  no palpable lymphadenopathy in the cervical, axillary or  inguinal LUNGS: clear to auscultation and percussion with normal breathing effort   HEART: regular rate & rhythm and no murmurs and no lower extremity edema ABDOMEN:abdomen soft, non-tender, no hepatomegaly, no splenomegaly and normal bowel sounds Musculoskeletal:no cyanosis of digits and no clubbing. No Homan's sign PSYCH: alert & oriented x 3 with fluent speech NEURO: no focal motor/sensory deficits  LABORATORY DATA:  I have reviewed the data as listed CBC Latest Ref Rng 10/31/2015 10/20/2015 10/17/2015  WBC 4.0 - 10.3 10e3/uL 5.9 5.1 6.2  Hemoglobin 13.0 - 17.1 g/dL 9.1(L) 10.2(L) 9.2(L)  Hematocrit 38.4 - 49.9 %  27.1(L) 30.8(L) 28.3(L)  Platelets 140 - 400 10e3/uL 262 261 162   ANC is 4.0  CMP Latest Ref Rng 10/31/2015 10/20/2015 10/17/2015  Glucose 70 - 140 mg/dl 133 122 168(H)  BUN 7.0 - 26.0 mg/dL 12.6 18.7 19.5  Creatinine 0.7 - 1.3 mg/dL 1.1 0.9 1.0  Sodium 136 - 145 mEq/L 137 141 140  Potassium 3.5 - 5.1 mEq/L 5.0 3.6 5.5 No visable hemolysis(H)  Chloride 101 - 111 mmol/L - - -  CO2 22 - 29 mEq/L '24 22 24  ' Calcium 8.4 - 10.4 mg/dL 9.3 9.2 8.6  Total Protein 6.4 - 8.3 g/dL 7.5 - 7.5  Total Bilirubin 0.20 - 1.20 mg/dL 0.69 - 0.53  Alkaline Phos 40 - 150 U/L 164(H) - 214(H)  AST 5 - 34 U/L 24 - 29  ALT 0 - 55 U/L 20 - 27   Mag 1.5 today  INITIAL tumor markers AFP 3.2 CEA > 10,000 CA 19-9 12,929.6  Results for RUDRANSH, BELLANCA (MRN [id number]) as of 10/17/2015 07:30  Ref. Range 07/05/2015 08:11 08/09/2015 08:40 09/06/2015 09:57 10/03/2015 08:49  CA 19-9 Latest Ref Range: <35.0 U/mL 310.6 (H) 49.7 (H) 53.7 (H) 62.3 (H)  CEA Latest Ref Range: 0.0-5.0 ng/mL 386.8 (H) 96.3 (H) 73.7 (H) 66.4 (H)      Pathology report  Liver, needle/core biopsy - METASTATIC ADENOCARCINOMA, SEE COMMENT. Microscopic Comment The adenocarcinoma demonstrates the following immunophenotype: Cytokeratin 7 - negative expression. Cytokeratin 20 - strong diffuse expression. CD2 - strong diffuse  expression. Overall the morphology and immunophenotype are that of metastatic adenocarcinoma primary to colorectum. The recent nuclear medicine scan demonstrating sigmoid mass with associated liver masses is noted.  [facility] test result:    RADIOGRAPHIC STUDIES: I have personally reviewed the outside CT scan image with patient and his son.   CT chest, abdomen and pelvis with IV contrast oN 08/16/2015 IMPRESSION: 1. Today's study demonstrates stable to slightly improved metastatic disease in the liver, as discussed above. 2. Interval enlargement of a 1.7 cm right adrenal nodule, suspicious for progressive metastasis. 3. Primary lesion in the sigmoid colon appears similar to the prior study, but there is increasing colonic wall thickening throughout the descending colon. This is associated with some surrounding inflammatory changes, favored to reflect an acute colitis, although expansion of infiltrative neoplasm is difficult to entirely exclude. 4. Previously noted mildly enlarged left retroperitoneal lymph node is stable in size. No new lymphadenopathy. 5. No suspicious pulmonary nodules on today's examination. 6. Additional incidental findings, as above, similar prior studies.  EGD in [facility], [place] Grade 2 versus was found in the low cervical esophagus, they will 5 mm in largest diameter.  Mild portal hypertension gets dropsy was found in the gastric fundus One oozing cratered gastric ulcer was seen at the incisura and gastric antrum, 7X7m 2 nonbleeding superficial duodenal ulcers with no stigmata of bleeding was found in the duodenal bowel  ASSESSMENT & PLAN:  70year old [place], with past history of hypertension and dilated nonischemic gammopathy with EF 25%, no clinical signs of heart failure, who was found to have hepatitis B infection lately, and multiple liver lesions on the CT scan. He has extremely high CEA and CA 19-9 levels. PET scan reviewed a  hypermetabolic sigmoid colon mass, diffuse liver metastasis, probable lung and adrenal gland metastasis.  1. Metastatic sigmoid colon cancer, with diffuse liver, lungs, node and left adrenal gland metastases. KRAS/NRAS wild type, MSI-stable -Liver biopsy showed metastatic adenocarcinoma. His tumor were strongly positive for CK20  and CD2, consistent with primary colorectal primary. KRAS and NRAS mutations were not detected.  -Pt understands that this is incurable cancer, and he has very high disease burden and overall prognosis is poor. The treatment goal is palliative -He had excellent response to first-line FOLFOX, but treatment was complicated with GI bleeding and neutropenic fever, resolved now. -he is currently on second line chemotherapy with irritecan and panitumumab, tolerating well  He is to undergo D1C4 today -His CEA has come down again as of 12/6, at 66.4, new values pending; he is likely responding to treatment well  2. Grade 1-2 skin rashes, improved  -Secondary to panitumumab, improved   -continue hydrocortisone 2.5%, and clindamycin gel 1%  twice daily as needed  -He knows to avoid sun exposure, and call the office if it gets worse.  3. Gastric ulcer with significant GI bleeding in April and Aug 2016 -He is on PPI, continue once daily  -Repeat his EGD on 08/15/2015 showed near complete healing of his gastric ulcer -continue Nadolol 20 mg bid, per Dr. Fuller Plan  4. Poorly controlled diabetes -HbA1c was 8.5, blood glucose not well controlled  -Urged him to follow-up with his primary care physician Dr. Jimmye Norman  5. HTN, Dilated nonischemic ischemia cardiomyopathy with EF 25% -He is clinically doing well without symptoms of CHF. However this is probably going to impact his chemotherapy.  Will try to avoid cardiotoxic chemotherapy agent and avoid fluid overload during chemotherapy. -Continue follow-up with cardiology.  6 Hepatitis B carrier, with mild portal hypertension  -Per  liver clinic, no need for treatment.  Follow-up with liver clinic.  7. Malnutrition -Encouraged him to eat more, and take supplements as needed. -follow up with Dietitian   8. Anemia secondary to GI bleeding, iron deficiency and chemo  -Repeat lab on 06/06/2015 showed ferritin 117, serum iron 24, saturation 8%, which supports iron deficiency -He received IV Feraheme again in Aug 2016 after GI bleeding  -Repeat  ferritin was 273 on 10/03/2015, much improved  -Slightly worse lately, likely secondary to chemotherapy.  11. hypomagnesemia  -secondary to panitumumab. This was held on 12/20 due to hypomagnesemia -he received IV magnesium 6 g with each infusion -He is taking magnesium pill 2 tablets 3 times a day, tolerating well no diarrhea. His Mg today is normal at 1.5. Will proceed with panitumumab -follow up closely. Continue IV magnesium weekly  Oral ulcer LIkely due to chemotherapy Will refill Magic Mouthwash today  Plan -continue irinotecan and panitumumab due to severe hypo-magnesium -6g iv magnesium today -Repeat a BMP and magnesium infusion on 12/23 (standing order) A prescription for Compazine 10 mg has been refilled for nausea -RTC for chemo in 2 weeks, restart panitumumab if magnesium >0.8   All questions were answered. The patient knows to call the clinic with any problems, questions or concerns.     Rondel Jumbo, PA-C 10/31/2015   8:49 AM

## 2015-10-31 NOTE — Patient Instructions (Signed)
[facility] Discharge Instructions for Patients Receiving Chemotherapy  Today you received the following chemotherapy agents Irinotecan/Vectibix/Magnesium.  To help prevent nausea and vomiting after your treatment, we encourage you to take your nausea medication as prescribed.   If you develop nausea and vomiting that is not controlled by your nausea medication, call the clinic.   BELOW ARE SYMPTOMS THAT SHOULD BE REPORTED IMMEDIATELY:  *FEVER GREATER THAN 100.5 F  *CHILLS WITH OR WITHOUT FEVER  NAUSEA AND VOMITING THAT IS NOT CONTROLLED WITH YOUR NAUSEA MEDICATION  *UNUSUAL SHORTNESS OF BREATH  *UNUSUAL BRUISING OR BLEEDING  TENDERNESS IN MOUTH AND THROAT WITH OR WITHOUT PRESENCE OF ULCERS  *URINARY PROBLEMS  *BOWEL PROBLEMS  UNUSUAL RASH Items with * indicate a potential emergency and should be followed up as soon as possible.  Feel free to call the clinic you have any questions or concerns. The clinic phone number is [phone].  Please show the [facility] at check-in to the Emergency Department and triage nurse.  Hypomagnesemia Hypomagnesemia is a condition in which the level of magnesium in the blood is low. Magnesium is a mineral that is found in many foods. It is used in many different processes in the body. Hypomagnesemia can affect every organ in the body. It can cause life-threatening problems. CAUSES Causes of hypomagnesemia include:  Not getting enough magnesium in your diet.  Malnutrition.  Problems with absorbing magnesium from the intestines.  Dehydration.  Alcohol abuse.  Vomiting.  Severe diarrhea.  Some medicines, including medicines that make you urinate more.  Certain diseases, such as kidney disease, diabetes, and overactive thyroid. SIGNS AND SYMPTOMS  Involuntary shaking or trembling of a body part (tremor).  Confusion.  Muscle weakness.  Sensitivity to light, sound, and touch.  Psychiatric issues, such  as depression, irritability, or psychosis.  Sudden tightening of muscles (muscle spasms).  Tingling in the arms and legs.  A feeling of fluttering of the heart. These symptoms are more severe if magnesium levels drop suddenly. DIAGNOSIS To make a diagnosis, your health care provider will do a physical exam and order blood and urine tests. TREATMENT Treatment will depend on the cause and the severity of your condition. It may involve:  A magnesium supplement. This can be taken in pill form. It can also be given through an IV tube. This is usually done if the condition is severe.  Changes to your diet. You may be directed to eat foods that have a lot of magnesium, such as green leafy vegetables, peas, beans, and nuts.  Eliminating alcohol from your diet. HOME CARE INSTRUCTIONS  Include foods with magnesium in your diet. Foods that are rich in magnesium include green vegetables, beans, nuts and seeds, and whole grains.  Take medicines only as directed by your health care provider.  Take magnesium supplements if your health care provider instructs you to do that. Take them as directed.  Have your magnesium levels monitored as directed by your health care provider.  When you are active, drink fluids that contain electrolytes.  Keep all follow-up visits as directed by your health care provider. This is important. SEEK MEDICAL CARE IF:  You get worse instead of better.  Your symptoms return. SEEK IMMEDIATE MEDICAL CARE IF:  Your symptoms are severe.   This information is not intended to replace advice given to you by your health care provider. Make sure you discuss any questions you have with your health care provider.   Document Released: 07/10/2005 Document  Revised: 11/04/2014 Document Reviewed: 05/30/2014 Elsevier Interactive Patient Education [employer].

## 2015-11-03 ENCOUNTER — Other Ambulatory Visit: Payer: Self-pay | Admitting: Nurse Practitioner

## 2015-11-03 ENCOUNTER — Telehealth: Payer: Self-pay | Admitting: *Deleted

## 2015-11-03 ENCOUNTER — Other Ambulatory Visit: Payer: Self-pay | Admitting: *Deleted

## 2015-11-03 NOTE — Telephone Encounter (Signed)
Call received in [place] from [place] regarding request for zofran.  Per inquiry questions were relating to drug replacement.  This RN directed call to [facility] in [place] per above.

## 2015-11-07 ENCOUNTER — Encounter: Payer: Self-pay | Admitting: Hematology

## 2015-11-07 ENCOUNTER — Ambulatory Visit (HOSPITAL_BASED_OUTPATIENT_CLINIC_OR_DEPARTMENT_OTHER): Payer: PPO

## 2015-11-07 ENCOUNTER — Other Ambulatory Visit: Payer: Self-pay | Admitting: Medical Oncology

## 2015-11-07 ENCOUNTER — Other Ambulatory Visit (HOSPITAL_BASED_OUTPATIENT_CLINIC_OR_DEPARTMENT_OTHER): Payer: PPO | Admitting: *Deleted

## 2015-11-07 ENCOUNTER — Other Ambulatory Visit: Payer: Self-pay | Admitting: Hematology

## 2015-11-07 VITALS — BP 97/50 | HR 108 | Temp 100.4°F | Resp 18 | Ht 65.0 in

## 2015-11-07 DIAGNOSIS — C187 Malignant neoplasm of sigmoid colon: Secondary | ICD-10-CM

## 2015-11-07 DIAGNOSIS — R509 Fever, unspecified: Secondary | ICD-10-CM

## 2015-11-07 DIAGNOSIS — D509 Iron deficiency anemia, unspecified: Secondary | ICD-10-CM

## 2015-11-07 LAB — URINALYSIS, MICROSCOPIC - CHCC
BLOOD: NEGATIVE
Bilirubin (Urine): NEGATIVE
GLUCOSE UR CHCC: NEGATIVE mg/dL
Ketones: NEGATIVE mg/dL
Leukocyte Esterase: NEGATIVE
NITRITE: NEGATIVE
PH: 6.5 (ref 4.6–8.0)
PROTEIN: NEGATIVE mg/dL
RBC / HPF: NEGATIVE (ref 0–2)
Specific Gravity, Urine: 1.005 (ref 1.003–1.035)
UROBILINOGEN UR: 0.2 mg/dL (ref 0.2–1)
WBC, UA: NEGATIVE (ref 0–2)

## 2015-11-07 LAB — BASIC METABOLIC PANEL
ANION GAP: 10 meq/L (ref 3–11)
BUN: 22.9 mg/dL (ref 7.0–26.0)
CALCIUM: 8.1 mg/dL — AB (ref 8.4–10.4)
CO2: 20 mEq/L — ABNORMAL LOW (ref 22–29)
Chloride: 101 mEq/L (ref 98–109)
Creatinine: 1.2 mg/dL (ref 0.7–1.3)
EGFR: 63 mL/min/{1.73_m2} — ABNORMAL LOW (ref >90)
Glucose: 126 mg/dl (ref 70–140)
POTASSIUM: 4.3 meq/L (ref 3.5–5.1)
SODIUM: 131 meq/L — AB (ref 136–145)

## 2015-11-07 LAB — CBC WITH DIFFERENTIAL/PLATELET
BASO%: 0.4 % (ref 0.0–2.0)
Basophils Absolute: 0 10*3/uL (ref 0.0–0.1)
EOS%: 0.8 % (ref 0.0–7.0)
Eosinophils Absolute: 0 10*3/uL (ref 0.0–0.5)
HCT: 22.3 % — ABNORMAL LOW (ref 38.4–49.9)
HGB: 7.5 g/dL — ABNORMAL LOW (ref 13.0–17.1)
LYMPH%: 43.1 % (ref 14.0–49.0)
MCH: 29.1 pg (ref 27.2–33.4)
MCHC: 33.6 g/dL (ref 32.0–36.0)
MCV: 86.4 fL (ref 79.3–98.0)
MONO#: 0.3 10*3/uL (ref 0.1–0.9)
MONO%: 12.1 % (ref 0.0–14.0)
NEUT%: 43.6 % (ref 39.0–75.0)
NEUTROS ABS: 1.1 10*3/uL — AB (ref 1.5–6.5)
PLATELETS: 157 10*3/uL (ref 140–400)
RBC: 2.58 10*6/uL — AB (ref 4.20–5.82)
RDW: 15.7 % — AB (ref 11.0–14.6)
WBC: 2.5 10*3/uL — AB (ref 4.0–10.3)
lymph#: 1.1 10*3/uL (ref 0.9–3.3)

## 2015-11-07 LAB — MAGNESIUM: MAGNESIUM: 1.6 mg/dL (ref 1.5–2.5)

## 2015-11-07 MED ORDER — ACETAMINOPHEN 325 MG PO TABS
650.0000 mg | ORAL_TABLET | Freq: Once | ORAL | Status: AC
  Administered 2015-11-07: 650 mg via ORAL

## 2015-11-07 MED ORDER — SODIUM CHLORIDE 0.9 % IV SOLN
INTRAVENOUS | Status: DC
  Administered 2015-11-07: 12:00:00 via INTRAVENOUS

## 2015-11-07 MED ORDER — SODIUM CHLORIDE 0.9 % IV SOLN
2.0000 g | Freq: Once | INTRAVENOUS | Status: AC
  Administered 2015-11-07: 2 g via INTRAVENOUS
  Filled 2015-11-07: qty 4

## 2015-11-07 MED ORDER — ACETAMINOPHEN 325 MG PO TABS
ORAL_TABLET | ORAL | Status: AC
  Filled 2015-11-07: qty 2

## 2015-11-07 MED ORDER — SODIUM CHLORIDE 0.9 % IV SOLN: 6.0000 g | Freq: Once | INTRAVENOUS | Status: DC

## 2015-11-07 NOTE — Progress Notes (Signed)
I faxed [employer] [phone] form for ondansetron

## 2015-11-07 NOTE — Progress Notes (Signed)
Pt magnesium level 1.6 today. Notified [name] and ordered for pt to continue with only 2gm of Magnesium for 1hr today. BMP results still pending. Will follow up with any abnormal labs with [name].  1600- Pt finished with Mag infusion. Recheck VS and pt had temp of 100.4. Rechecked twice with same result. Notified [name] and Stat blood culture, UA and culture, and cbc with diff. Obtained labs, urine, culture.  1620- Pt cbc shows abnormal and low counts. Notified [name] and ordered for pt to receive blood transfusion this week. Md will place pof for pt transfusion. Pt given new appt for labs t&c tomorrow and transfusion. Tylenol 650 po given for low grade temp and pt will recheck temp at home. Noticed pt having moist nonproductive cough. Pt states that he has been coughing for a few days. He states that he occasionally expectorates a light yellow thick sputum. Told pt to reinforce good hand hygiene, and staying away from sick or ill family members/ friends. Gave pt mask for when he is around other people. Told pt to stay away from public crowded places as well. Pt and son verbalized understanding and knows when to call if fever does not go away. Printed AVS for lab and blood transfusion tomorrow.

## 2015-11-07 NOTE — Progress Notes (Signed)
I placed form from [employer] for auth for ondansetron for [name] to sign

## 2015-11-08 ENCOUNTER — Encounter (HOSPITAL_COMMUNITY): Payer: Self-pay

## 2015-11-08 ENCOUNTER — Ambulatory Visit (HOSPITAL_BASED_OUTPATIENT_CLINIC_OR_DEPARTMENT_OTHER): Payer: Medicare Other

## 2015-11-08 ENCOUNTER — Other Ambulatory Visit: Payer: Self-pay | Admitting: *Deleted

## 2015-11-08 ENCOUNTER — Emergency Department (HOSPITAL_COMMUNITY): Payer: PPO

## 2015-11-08 ENCOUNTER — Inpatient Hospital Stay
Admission: RE | Admit: 2015-11-08 | Discharge: 2015-11-08 | Disposition: A | Discharge status: Home / Self Care | Payer: Self-pay | Source: Ambulatory Visit | Attending: Hematology | Admitting: Hematology

## 2015-11-08 ENCOUNTER — Other Ambulatory Visit: Payer: Self-pay | Admitting: Hematology

## 2015-11-08 ENCOUNTER — Other Ambulatory Visit: Payer: Self-pay

## 2015-11-08 ENCOUNTER — Ambulatory Visit (HOSPITAL_COMMUNITY)
Admission: RE | Admit: 2015-11-08 | Discharge: 2015-11-08 | Disposition: A | Discharge status: Home / Self Care | Payer: PPO | Source: Ambulatory Visit | Attending: Hematology | Admitting: Hematology

## 2015-11-08 ENCOUNTER — Inpatient Hospital Stay (HOSPITAL_COMMUNITY)
Admission: EM | Admit: 2015-11-08 | Discharge: 2015-11-10 | DRG: 871 | Disposition: A | Discharge status: Home / Self Care | Payer: PPO | Attending: Internal Medicine | Admitting: Internal Medicine

## 2015-11-08 ENCOUNTER — Ambulatory Visit: Payer: Medicare Other

## 2015-11-08 ENCOUNTER — Encounter: Payer: Self-pay | Admitting: Hematology

## 2015-11-08 VITALS — BP 74/40 | HR 101 | Temp 99.0°F | Resp 18

## 2015-11-08 DIAGNOSIS — C187 Malignant neoplasm of sigmoid colon: Secondary | ICD-10-CM | POA: Diagnosis present

## 2015-11-08 DIAGNOSIS — Z6823 Body mass index (BMI) 23.0-23.9, adult: Secondary | ICD-10-CM | POA: Diagnosis not present

## 2015-11-08 DIAGNOSIS — Z7984 Long term (current) use of oral hypoglycemic drugs: Secondary | ICD-10-CM

## 2015-11-08 DIAGNOSIS — E860 Dehydration: Secondary | ICD-10-CM | POA: Diagnosis present

## 2015-11-08 DIAGNOSIS — R6520 Severe sepsis without septic shock: Secondary | ICD-10-CM | POA: Diagnosis present

## 2015-11-08 DIAGNOSIS — I429 Cardiomyopathy, unspecified: Secondary | ICD-10-CM | POA: Diagnosis present

## 2015-11-08 DIAGNOSIS — D649 Anemia, unspecified: Secondary | ICD-10-CM

## 2015-11-08 DIAGNOSIS — K259 Gastric ulcer, unspecified as acute or chronic, without hemorrhage or perforation: Secondary | ICD-10-CM | POA: Diagnosis present

## 2015-11-08 DIAGNOSIS — C7800 Secondary malignant neoplasm of unspecified lung: Secondary | ICD-10-CM | POA: Diagnosis present

## 2015-11-08 DIAGNOSIS — I5023 Acute on chronic systolic (congestive) heart failure: Secondary | ICD-10-CM | POA: Diagnosis not present

## 2015-11-08 DIAGNOSIS — Z79899 Other long term (current) drug therapy: Secondary | ICD-10-CM

## 2015-11-08 DIAGNOSIS — C787 Secondary malignant neoplasm of liver and intrahepatic bile duct: Secondary | ICD-10-CM | POA: Diagnosis present

## 2015-11-08 DIAGNOSIS — I5043 Acute on chronic combined systolic (congestive) and diastolic (congestive) heart failure: Secondary | ICD-10-CM | POA: Diagnosis present

## 2015-11-08 DIAGNOSIS — C801 Malignant (primary) neoplasm, unspecified: Secondary | ICD-10-CM

## 2015-11-08 DIAGNOSIS — E119 Type 2 diabetes mellitus without complications: Secondary | ICD-10-CM | POA: Diagnosis present

## 2015-11-08 DIAGNOSIS — E46 Unspecified protein-calorie malnutrition: Secondary | ICD-10-CM | POA: Diagnosis present

## 2015-11-08 DIAGNOSIS — I959 Hypotension, unspecified: Secondary | ICD-10-CM | POA: Diagnosis not present

## 2015-11-08 DIAGNOSIS — K529 Noninfective gastroenteritis and colitis, unspecified: Secondary | ICD-10-CM | POA: Diagnosis present

## 2015-11-08 DIAGNOSIS — D701 Agranulocytosis secondary to cancer chemotherapy: Secondary | ICD-10-CM | POA: Diagnosis present

## 2015-11-08 DIAGNOSIS — I110 Hypertensive heart disease with heart failure: Secondary | ICD-10-CM | POA: Diagnosis present

## 2015-11-08 DIAGNOSIS — D6481 Anemia due to antineoplastic chemotherapy: Secondary | ICD-10-CM | POA: Diagnosis present

## 2015-11-08 DIAGNOSIS — C7970 Secondary malignant neoplasm of unspecified adrenal gland: Secondary | ICD-10-CM | POA: Diagnosis present

## 2015-11-08 DIAGNOSIS — E871 Hypo-osmolality and hyponatremia: Secondary | ICD-10-CM | POA: Diagnosis present

## 2015-11-08 DIAGNOSIS — J101 Influenza due to other identified influenza virus with other respiratory manifestations: Secondary | ICD-10-CM | POA: Diagnosis present

## 2015-11-08 DIAGNOSIS — B1910 Unspecified viral hepatitis B without hepatic coma: Secondary | ICD-10-CM | POA: Diagnosis present

## 2015-11-08 DIAGNOSIS — A419 Sepsis, unspecified organism: Principal | ICD-10-CM | POA: Diagnosis present

## 2015-11-08 DIAGNOSIS — D72819 Decreased white blood cell count, unspecified: Secondary | ICD-10-CM | POA: Diagnosis present

## 2015-11-08 DIAGNOSIS — Z87891 Personal history of nicotine dependence: Secondary | ICD-10-CM

## 2015-11-08 DIAGNOSIS — T451X5A Adverse effect of antineoplastic and immunosuppressive drugs, initial encounter: Secondary | ICD-10-CM | POA: Diagnosis present

## 2015-11-08 LAB — COMPREHENSIVE METABOLIC PANEL
ALT: 23 U/L (ref 17–63)
AST: 42 U/L — AB (ref 15–41)
Albumin: 2.6 g/dL — ABNORMAL LOW (ref 3.5–5.0)
Alkaline Phosphatase: 126 U/L (ref 38–126)
Anion gap: 8 (ref 5–15)
BILIRUBIN TOTAL: 0.9 mg/dL (ref 0.3–1.2)
BUN: 23 mg/dL — AB (ref 6–20)
CALCIUM: 7.5 mg/dL — AB (ref 8.9–10.3)
CO2: 20 mmol/L — ABNORMAL LOW (ref 22–32)
CREATININE: 1.12 mg/dL (ref 0.61–1.24)
Chloride: 105 mmol/L (ref 101–111)
GFR calc Af Amer: >60 mL/min (ref >60)
Glucose, Bld: 92 mg/dL (ref 65–99)
POTASSIUM: 4.9 mmol/L (ref 3.5–5.1)
Sodium: 133 mmol/L — ABNORMAL LOW (ref 135–145)
TOTAL PROTEIN: 5.5 g/dL — AB (ref 6.5–8.1)

## 2015-11-08 LAB — URINALYSIS, ROUTINE W REFLEX MICROSCOPIC
Bilirubin Urine: NEGATIVE
Glucose, UA: NEGATIVE mg/dL
Hgb urine dipstick: NEGATIVE
Ketones, ur: NEGATIVE mg/dL
LEUKOCYTES UA: NEGATIVE
NITRITE: NEGATIVE
PROTEIN: NEGATIVE mg/dL
Specific Gravity, Urine: 1.009 (ref 1.005–1.030)
pH: 6.5 (ref 5.0–8.0)

## 2015-11-08 LAB — I-STAT CG4 LACTIC ACID, ED
LACTIC ACID, VENOUS: 0.58 mmol/L (ref 0.5–2.0)
Lactic Acid, Venous: 1.39 mmol/L (ref 0.5–2.0)

## 2015-11-08 LAB — CBC WITH DIFFERENTIAL/PLATELET
BASOS ABS: 0 10*3/uL (ref 0.0–0.1)
BASOS PCT: 0 %
Eosinophils Absolute: 0 10*3/uL (ref 0.0–0.7)
Eosinophils Relative: 0 %
HEMATOCRIT: 23.3 % — AB (ref 39.0–52.0)
HEMOGLOBIN: 8.1 g/dL — AB (ref 13.0–17.0)
Lymphocytes Relative: 22 %
Lymphs Abs: 0.3 10*3/uL — ABNORMAL LOW (ref 0.7–4.0)
MCH: 29.7 pg (ref 26.0–34.0)
MCHC: 34.8 g/dL (ref 30.0–36.0)
MCV: 85.3 fL (ref 78.0–100.0)
Monocytes Absolute: 0.1 10*3/uL (ref 0.1–1.0)
Monocytes Relative: 7 %
NEUTROS ABS: 1 10*3/uL — AB (ref 1.7–7.7)
NEUTROS PCT: 71 %
Platelets: 89 10*3/uL — ABNORMAL LOW (ref 150–400)
RBC: 2.73 MIL/uL — ABNORMAL LOW (ref 4.22–5.81)
RDW: 14.7 % (ref 11.5–15.5)
WBC: 1.5 10*3/uL — ABNORMAL LOW (ref 4.0–10.5)

## 2015-11-08 LAB — MAGNESIUM: MAGNESIUM: 1.4 mg/dL — AB (ref 1.7–2.4)

## 2015-11-08 LAB — CBG MONITORING, ED: Glucose-Capillary: 139 mg/dL — ABNORMAL HIGH (ref 65–99)

## 2015-11-08 LAB — POC OCCULT BLOOD, ED: FECAL OCCULT BLD: NEGATIVE

## 2015-11-08 LAB — PREPARE RBC (CROSSMATCH)

## 2015-11-08 MED ORDER — HEPARIN SOD (PORK) LOCK FLUSH 100 UNIT/ML IV SOLN
500.0000 [IU] | Freq: Every day | INTRAVENOUS | Status: DC | PRN
  Filled 2015-11-08: qty 5

## 2015-11-08 MED ORDER — METHYLPREDNISOLONE SODIUM SUCC 125 MG IJ SOLR
60.0000 mg | Freq: Two times a day (BID) | INTRAMUSCULAR | Status: DC
  Administered 2015-11-08 – 2015-11-09 (×2): 60 mg via INTRAVENOUS
  Filled 2015-11-08 (×2): qty 2

## 2015-11-08 MED ORDER — SODIUM CHLORIDE 0.9 % IJ SOLN
10.0000 mL | INTRAMUSCULAR | Status: DC | PRN
  Filled 2015-11-08: qty 10

## 2015-11-08 MED ORDER — SODIUM CHLORIDE 0.9 % IV BOLUS (SEPSIS)
1000.0000 mL | INTRAVENOUS | Status: AC
  Administered 2015-11-08: 1000 mL via INTRAVENOUS

## 2015-11-08 MED ORDER — SODIUM CHLORIDE 0.9 % IV SOLN
500.0000 mg | Freq: Two times a day (BID) | INTRAVENOUS | Status: DC
  Administered 2015-11-09 – 2015-11-10 (×3): 500 mg via INTRAVENOUS
  Filled 2015-11-08 (×5): qty 500

## 2015-11-08 MED ORDER — FUROSEMIDE 10 MG/ML IJ SOLN
INTRAMUSCULAR | Status: AC
  Filled 2015-11-08: qty 2

## 2015-11-08 MED ORDER — ONDANSETRON HCL 4 MG PO TABS: 8.0000 mg | ORAL_TABLET | Freq: Three times a day (TID) | ORAL | Status: DC | PRN

## 2015-11-08 MED ORDER — VANCOMYCIN HCL IN DEXTROSE 1-5 GM/200ML-% IV SOLN
1000.0000 mg | Freq: Once | INTRAVENOUS | Status: AC
  Administered 2015-11-08: 1000 mg via INTRAVENOUS
  Filled 2015-11-08: qty 200

## 2015-11-08 MED ORDER — DIPHENHYDRAMINE HCL 25 MG PO CAPS
25.0000 mg | ORAL_CAPSULE | Freq: Once | ORAL | Status: AC
  Administered 2015-11-08: 25 mg via ORAL

## 2015-11-08 MED ORDER — PIPERACILLIN-TAZOBACTAM 3.375 G IVPB
3.3750 g | Freq: Three times a day (TID) | INTRAVENOUS | Status: DC
  Administered 2015-11-09 – 2015-11-10 (×5): 3.375 g via INTRAVENOUS
  Filled 2015-11-08 (×6): qty 50

## 2015-11-08 MED ORDER — PROCHLORPERAZINE MALEATE 10 MG PO TABS
10.0000 mg | ORAL_TABLET | Freq: Four times a day (QID) | ORAL | Status: DC | PRN
  Filled 2015-11-08: qty 1

## 2015-11-08 MED ORDER — INSULIN ASPART 100 UNIT/ML ~~LOC~~ SOLN
0.0000 [IU] | Freq: Three times a day (TID) | SUBCUTANEOUS | Status: DC
  Administered 2015-11-09: 1 [IU] via SUBCUTANEOUS
  Administered 2015-11-09: 2 [IU] via SUBCUTANEOUS
  Administered 2015-11-09: 5 [IU] via SUBCUTANEOUS
  Administered 2015-11-10: 2 [IU] via SUBCUTANEOUS
  Administered 2015-11-10: 1 [IU] via SUBCUTANEOUS

## 2015-11-08 MED ORDER — METFORMIN HCL 500 MG PO TABS
500.0000 mg | ORAL_TABLET | Freq: Two times a day (BID) | ORAL | Status: DC
  Administered 2015-11-09 – 2015-11-10 (×3): 500 mg via ORAL
  Filled 2015-11-08 (×7): qty 1

## 2015-11-08 MED ORDER — DIPHENHYDRAMINE HCL 25 MG PO CAPS
ORAL_CAPSULE | ORAL | Status: AC
  Filled 2015-11-08: qty 1

## 2015-11-08 MED ORDER — INSULIN ASPART 100 UNIT/ML ~~LOC~~ SOLN
0.0000 [IU] | Freq: Every day | SUBCUTANEOUS | Status: DC
Start: 2015-11-08 — End: 2015-11-10
  Administered 2015-11-09: 2 [IU] via SUBCUTANEOUS

## 2015-11-08 MED ORDER — ACETAMINOPHEN 325 MG PO TABS
650.0000 mg | ORAL_TABLET | Freq: Once | ORAL | Status: AC
  Administered 2015-11-08: 650 mg via ORAL

## 2015-11-08 MED ORDER — FOLIC ACID 1 MG PO TABS
1.0000 mg | ORAL_TABLET | Freq: Every day | ORAL | Status: DC
  Administered 2015-11-08 – 2015-11-10 (×3): 1 mg via ORAL
  Filled 2015-11-08 (×3): qty 1

## 2015-11-08 MED ORDER — SODIUM CHLORIDE 0.9 % IV SOLN
250.0000 mL | Freq: Once | INTRAVENOUS | Status: AC
  Administered 2015-11-08: 250 mL via INTRAVENOUS

## 2015-11-08 MED ORDER — SODIUM CHLORIDE 0.9 % IV SOLN
INTRAVENOUS | Status: DC
Start: 2015-11-08 — End: 2015-11-09
  Administered 2015-11-08: 22:00:00 via INTRAVENOUS

## 2015-11-08 MED ORDER — MAGIC MOUTHWASH
5.0000 mL | Freq: Four times a day (QID) | ORAL | Status: DC
  Administered 2015-11-08 – 2015-11-10 (×6): 5 mL via ORAL
  Filled 2015-11-08 (×8): qty 5

## 2015-11-08 MED ORDER — PANTOPRAZOLE SODIUM 40 MG PO TBEC
40.0000 mg | DELAYED_RELEASE_TABLET | Freq: Every day | ORAL | Status: DC
  Administered 2015-11-08 – 2015-11-10 (×3): 40 mg via ORAL
  Filled 2015-11-08 (×3): qty 1

## 2015-11-08 MED ORDER — FUROSEMIDE 10 MG/ML IJ SOLN: 20.0000 mg | Freq: Once | INTRAMUSCULAR | Status: DC

## 2015-11-08 MED ORDER — OSELTAMIVIR PHOSPHATE 75 MG PO CAPS
75.0000 mg | ORAL_CAPSULE | Freq: Two times a day (BID) | ORAL | Status: DC
  Administered 2015-11-08 – 2015-11-10 (×4): 75 mg via ORAL
  Filled 2015-11-08 (×7): qty 1

## 2015-11-08 MED ORDER — ACETAMINOPHEN 500 MG PO TABS
ORAL_TABLET | ORAL | Status: AC
  Filled 2015-11-08: qty 2

## 2015-11-08 MED ORDER — HYDROCORTISONE NA SUCCINATE PF 100 MG IJ SOLR
100.0000 mg | Freq: Once | INTRAMUSCULAR | Status: AC
  Administered 2015-11-08: 100 mg via INTRAVENOUS
  Filled 2015-11-08: qty 2

## 2015-11-08 MED ORDER — SODIUM CHLORIDE 0.9 % IJ SOLN: 3.0000 mL | Freq: Two times a day (BID) | INTRAMUSCULAR | Status: DC

## 2015-11-08 MED ORDER — PIPERACILLIN-TAZOBACTAM 3.375 G IVPB 30 MIN
3.3750 g | Freq: Once | INTRAVENOUS | Status: AC
  Administered 2015-11-08: 3.375 g via INTRAVENOUS
  Filled 2015-11-08: qty 50

## 2015-11-08 NOTE — ED Notes (Signed)
He was seen by [name] at our [facility] today; was here for routine visit, which included transfusion.  He arrives in E.D. Accompanied by a [place] with infusing IV of N.S. Via his left chest port-a-cath.  He is awake, alert and in no distress.  A young male adult family member is with him.  Dr. Dayna Barker has just seen him.

## 2015-11-08 NOTE — H&P (Signed)
History and Physical    Random Tousant [id number] DOB: June 02, 1946 DOA: 11/08/2015  Referring physician: Dr. Dayna Barker PCP: [name], MD  Specialists: none  Chief Complaint: weakness  HPI: Jason Reilly is a 70 y.o. 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 from oncology, currently undergoing chemotherapy, is being brought from the [facility] for hypotension and weakness. He was seen there yesterday and was given magnesium for hypomagnesemia, and yesterday he was borderline hypotensive with a blood pressure of 97/50, heart rate of 108 as well as a temperature of 100.4. He was feeling asymptomatic at that time. He returned today for a blood transfusion, and he was noted to be hypotensive with BP 74/40, heart rate of 101 and low-grade temperature of 99. He was directed to come to the emergency room. On arrival, patient was hypotensive, received 2 L of normal saline bolus, with improvement in his mentation as well as blood pressure. He was started on sepsis protocol and received broad-spectrum IV antibiotics with vancomycin and Zosyn, and given improvement of his blood pressure with IV fluids TRH was asked for admission to stepdown. Patient is seen via bedside interpreter, he denies any chest pain, he denies any shortness of breath, he denies any palpitations, he denies any abdominal pain, nausea, vomiting. He endorses diarrhea which is chronic due to his chemotherapy. He denies any fever or chills. He endorses an upper respiratory illness going around in several family members in his family. He endorses a cough is nonproductive for the past few days. He denies any blood in his stool, he denies any burning with urination. Blood work in the emergency room showed a normal lactic acid of 1.39, leukopenia with a white count of 2.5 and an ANC of 1.1, anemia with a hemoglobin of 7.5 and normal platelets of 157.  He is mildly hyponatremic, and renal function seems to be within normal limits.  Review of Systems: As per history of present illness, otherwise 10 point review of systems negative  Past Medical History  Diagnosis Date  . Tachycardia   . Abnormal EKG   . Hypertension   . Non-ischemic cardiomyopathy ([place])   . Hepatitis B   . H. pylori infection   . Gastric ulcer   . Metastatic colon cancer to liver ([place])   . Diabetes mellitus without complication ([place])     metformin   Past Surgical History  Procedure Laterality Date  . Nuclear stress test  03/03/2013    High risk - consistent with nonischemic cardiomyopathy  . Left and right heart catheterization with coronary angiogram N/A 03/29/2013    Procedure: LEFT AND RIGHT HEART CATHETERIZATION WITH CORONARY ANGIOGRAM;  Surgeon: Pixie Casino, MD;  Location: [facility] CATH LAB;  Service: Cardiovascular;  Laterality: N/A;  . Esophagogastroduodenoscopy N/A 02/08/2015    Procedure: ESOPHAGOGASTRODUODENOSCOPY (EGD);  Surgeon: Ladene Artist, MD;  Location: [facility] ENDOSCOPY;  Service: Endoscopy;  Laterality: N/A;  . Esophagogastroduodenoscopy (egd) with propofol N/A 08/15/2015    Procedure: ESOPHAGOGASTRODUODENOSCOPY (EGD) WITH PROPOFOL;  Surgeon: Ladene Artist, MD;  Location: [facility] ENDOSCOPY;  Service: Endoscopy;  Laterality: N/A;   Social History:  reports that he has quit smoking. He has never used smokeless tobacco. He reports that he drinks alcohol. He reports that he does not use illicit drugs.  No Known Allergies  No family history on file.  Prior to Admission medications   Medication Sig Start Date End  Date Taking? Authorizing Provider  clindamycin (CLINDAGEL) 1 % gel Apply topically 2 (two) times daily. 09/19/15  Yes Truitt Merle, MD  folic acid (FOLVITE) 1 MG tablet Take 1 tablet (1 mg total) by mouth daily. 10/17/15  Yes Truitt Merle, MD  hydrocortisone 2.5 % cream Apply topically 2 (two) times daily. 08/24/15  Yes Truitt Merle, MD  lidocaine-prilocaine  (EMLA) cream Apply 1 application topically as needed. Apply to [facility] cath at least one hour before needle stick. 07/26/15  Yes Truitt Merle, MD  lisinopril-hydrochlorothiazide (PRINZIDE,ZESTORETIC) 10-12.5 MG tablet TAKE ONE TABLET BY MOUTH ONCE DAILY 10/02/15  Yes Volanda Napoleon, MD  magnesium oxide (MAG-OX) 400 (241.3 MG) MG tablet Take 1 tablet (400 mg total) by mouth 2 (two) times daily. 08/23/15  Yes Owens Shark, NP  metFORMIN (GLUCOPHAGE) 500 MG tablet Take 1 tablet (500 mg total) by mouth 2 (two) times daily with a meal. THIS IS A ONE TIME ORDER.  FUTURE REFILLS NEED TO BE DONE BY PRIMARY MD. 10/17/15  Yes Truitt Merle, MD  metroNIDAZOLE (FLAGYL) 500 MG tablet Take 1 tablet (500 mg total) by mouth every 8 (eight) hours. 06/29/15  Yes Belkys A Regalado, MD  minocycline (MINOCIN) 100 MG capsule Take 1 capsule (100 mg total) by mouth 2 (two) times daily. 08/23/15  Yes Owens Shark, NP  nadolol (CORGARD) 20 MG tablet Take 1 tablet (20 mg total) by mouth 2 (two) times daily. THIS IS A ONE TIME ORDER.  FUTURE REFILLS NEED TO BE DONE BY PRIMARY MD. 10/17/15  Yes Truitt Merle, MD  ondansetron (ZOFRAN) 8 MG tablet Take 1 tablet (8 mg total) by mouth every 8 (eight) hours as needed for nausea or vomiting. 10/17/15  Yes Truitt Merle, MD  pantoprazole (PROTONIX) 40 MG tablet Take 1 tablet (40 mg total) by mouth daily. 10/17/15  Yes Truitt Merle, MD  prochlorperazine (COMPAZINE) 10 MG tablet Take 1 tablet (10 mg total) by mouth every 6 (six) hours as needed for nausea or vomiting. 10/31/15  Yes Coralee Pesa Wertman, PA-C  loperamide (IMODIUM) 2 MG capsule Take 2 capsules (4 mg total) by mouth 4 (four) times daily as needed for diarrhea or loose stools (NO MORE THAN 8 TABLETS PER DAY.). 10/17/15   Truitt Merle, MD  magic mouthwash SOLN Take 5 mLs by mouth 4 (four) times daily. Patient taking differently: Take 5 mLs by mouth 4 (four) times daily as needed.  10/31/15   Rondel Jumbo, PA-C   Physical Exam: Filed Vitals:   11/08/15 1629  11/08/15 1653 11/08/15 1654 11/08/15 1834  BP: 93/59 94/67  93/75  Pulse: 79 80  72  Temp:      TempSrc:      Resp: 15 16  14   Height:   5\' 5"  (1.651 m)   Weight:   63.05 kg (139 lb)   SpO2: 95% 100%  100%     GENERAL: NAD  HEENT: head NCAT, no scleral icterus. Pupils round and reactive. Mucous membranes are moist. Posterior pharynx clear of any exudate or lesions.  NECK: Supple.  LUNGS: Clear to auscultation. No wheezing or crackles  HEART: Regular rate and rhythm without murmur. 2+ pulses, no JVD, no peripheral edema  ABDOMEN: Soft, nontender, and nondistended. Positive bowel sounds.   EXTREMITIES: Without any cyanosis, clubbing, rash, lesions or edema.  NEUROLOGIC: Alert and oriented x3. Cranial nerves II through XII are grossly intact. Strength 5/5 in all 4.  PSYCHIATRIC: Normal mood and affect   Labs  on Admission:  Basic Metabolic Panel:  Recent Labs Lab 11/07/15 1319 11/08/15 1625  NA 131* 133*  K 4.3 4.9  CL  --  105  CO2 20* 20*  GLUCOSE 126 92  BUN 22.9 23*  CREATININE 1.2 1.12  CALCIUM 8.1* 7.5*  MG 1.6  --    Liver Function Tests:  Recent Labs Lab 11/08/15 1625  AST 42*  ALT 23  ALKPHOS 126  BILITOT 0.9  PROT 5.5*  ALBUMIN 2.6*   CBC:  Recent Labs Lab 11/07/15 1614  WBC 2.5*  NEUTROABS 1.1*  HGB 7.5*  HCT 22.3*  MCV 86.4  PLT 157   Radiological Exams on Admission: Dg Chest 2 View  11/08/2015  CLINICAL DATA:  History of colon cancer for blood transfusion. EXAM: CHEST  2 VIEW COMPARISON:  02/08/2015 FINDINGS: The heart is mildly enlarged but stable. Mild tortuosity of the thoracic aorta. The right IJ power port is in stable position. No complicating features. Low lung volumes with vascular crowding and streaky atelectasis. Possible mild central vascular congestion but no overt pulmonary edema or pleural effusions. IMPRESSION: Low lung volumes with vascular crowding and streaky bibasilar atelectasis. Vascular congestion without overt  pulmonary edema. Electronically Signed   By: Marijo Sanes M.D.   On: 11/08/2015 16:56    EKG: Independently reviewed.  Assessment/Plan Active Problems:   Absolute anemia   Hypotension   Sepsis (HCC)   Acute on chronic systolic (congestive) heart failure (HCC)   Leukopenia due to antineoplastic chemotherapy   Severe Sepsis with hypotension - We will leukopenia, not fully neutropenia just yet, closely monitor - We'll start broad-spectrum antibiotics with vancomycin and Zosyn, given upper respiratory illness in the family will rule out for influenza and start empiric Tamiflu which can be discontinued if influenza returns negative. - Blood cultures obtained in the emergency room, urine cultures obtained we'll follow on the results - Blood pressure responding to IV fluids, closely monitor overnight, if he becomes hypotensive again will need [facility] consult and potentially pressors. He is full code. - Empiric IV steroids started by EDP given adrenal metastasis and concern for adrenal insufficiency, we'll continue for now  Metastatic sigmoid colon cancer with liver, lung and adrenal metastasis - He is seeing [name] Who is aware of patient's hospitalization - Chemotherapy as an outpatient.   History of gastric ulcer with significant GI bleed in 2016 - Repeat EGD in October 2016 with healing of his gastric ulcer - Given anemia, transfuse as needed, closely monitor for bleeding, avoid Lovenox for now - continue PPI  Diabetes mellitus - Last hemoglobin A1c was 5.5 in August 2016 and and 8.2 prior - Continue metformin, sliding scale insulin  Acute on chronic combined systolic and diastolic heart failure  - Most recent 2-D echo was in March 2016 showed an ejection fraction of 40-45% as well as grade 1 diastolic dysfunction. Thought to have NICM. - Chest x-ray this admission with vascular congestion, he did well with fluids, continue for now, closely monitor his respiratory status, currently he  is on room air however very high risk of getting fluid overloaded quickly. Treat sepsis as per #1   Malnutrition  Anemia  - Likely multifactorial due to chemotherapy, prior GI bleed and iron deficiency - Transfuse as needed to keep hemoglobin greater than 7  Hypomagnesemia - due to panitumumab - monitor on telemetry, check Mg  Hypertension - Patient took his blood pressure medications this morning, hold for now given hypotension  - at home  he is on Prinzide as well as Nadolol  Chronic diarrhea  - likely due to chemotherapy   Diet: carb modified Fluids: NS DVT Prophylaxis: SCD  Code Status: Full  Family Communication: d/w son bedside  Disposition Plan: admit to SDU   Joie Hipps M. Cruzita Lederer, MD [facility] Pager [phone]  If 7PM-7AM, please contact night-coverage [email or web address] Password [facility] 11/08/2015, 6:34 PM

## 2015-11-08 NOTE — ED Notes (Signed)
Note:  He has just finished a 1000 cc IV NS bolus at this time. I add a second 1000 cc bolus per order of Dr. Dayna Barker at this time.  Pt. Remains in no distress.

## 2015-11-08 NOTE — ED Notes (Signed)
Pt with blood actively transfusing, unable to draw blood at this time.

## 2015-11-08 NOTE — ED Notes (Signed)
Peripheral IV started to maintain blood transfusion with fluid boluses.  Blood added to peripheral line.

## 2015-11-08 NOTE — ED Notes (Signed)
Bed: RESB Expected date:  Expected time:  Means of arrival:  Comments: Cancer pt. Hypotensive.

## 2015-11-08 NOTE — Progress Notes (Signed)
Late entry for 1440: Pt in for blood transfusion. Low BP noted, pt lethargic. Productive cough, yellow sputum. [name] in to see pt, order received to discontinue blood, start IV fluids wide open. Same done. [name] called for ED transfer. Pt transported to ED via wheelchair with son at his side.

## 2015-11-08 NOTE — ED Provider Notes (Signed)
CSN: [id number]     Arrival date & time 11/08/15  1507 History   First MD Initiated Contact with Patient 11/08/15 1511     Chief Complaint  Patient presents with  . Hypotension  . Colon Cancer     (Consider location/radiation/quality/duration/timing/severity/associated sxs/prior Treatment) Patient is a 70 y.o. male presenting with weakness. A language interpreter was used.  Weakness This is a new problem. The current episode started 2 days ago. The problem occurs constantly. The problem has not changed since onset.Associated symptoms include shortness of breath. Pertinent negatives include no chest pain. Nothing aggravates the symptoms. Nothing relieves the symptoms. He has tried nothing for the symptoms. The treatment provided no relief.    Past Medical History  Diagnosis Date  . Tachycardia   . Abnormal EKG   . Hypertension   . Non-ischemic cardiomyopathy ([place])   . Hepatitis B   . H. pylori infection   . Gastric ulcer   . Metastatic colon cancer to liver ([place])   . Diabetes mellitus without complication ([place])     metformin   Past Surgical History  Procedure Laterality Date  . Nuclear stress test  03/03/2013    High risk - consistent with nonischemic cardiomyopathy  . Left and right heart catheterization with coronary angiogram N/A 03/29/2013    Procedure: LEFT AND RIGHT HEART CATHETERIZATION WITH CORONARY ANGIOGRAM;  Surgeon: Pixie Casino, MD;  Location: [facility] CATH LAB;  Service: Cardiovascular;  Laterality: N/A;  . Esophagogastroduodenoscopy N/A 02/08/2015    Procedure: ESOPHAGOGASTRODUODENOSCOPY (EGD);  Surgeon: Ladene Artist, MD;  Location: [facility]OS[facility];  Service: Endoscopy;  Laterality: N/A;  . Esophagogastroduodenoscopy (egd) with propofol N/A 08/15/2015    Procedure: ESOPHAGOGASTRODUODENOSCOPY (EGD) WITH PROPOFOL;  Surgeon: Ladene Artist, MD;  Location: [facility] ENDOSCOPY;  Service: Endoscopy;  Laterality: N/A;   No family history on file. Social History  Substance Use  Topics  . Smoking status: Former Research scientist (life sciences)  . Smokeless tobacco: Never Used  . Alcohol Use: Yes     Comment: occasional    Review of Systems  Constitutional: Positive for fever (100.4 yesterday) and fatigue. Negative for chills.  HENT: Negative for congestion and drooling.   Eyes: Negative for pain.  Respiratory: Positive for cough and shortness of breath.   Cardiovascular: Negative for chest pain.  Genitourinary: Negative for dysuria.  Neurological: Positive for weakness.  All other systems reviewed and are negative.     Allergies  Review of patient's allergies indicates no known allergies.  Home Medications   Prior to Admission medications   Medication Sig Start Date End Date Taking? Authorizing Provider  clindamycin (CLINDAGEL) 1 % gel Apply topically 2 (two) times daily. 09/19/15   Truitt Merle, MD  folic acid (FOLVITE) 1 MG tablet Take 1 tablet (1 mg total) by mouth daily. 10/17/15   Truitt Merle, MD  hydrocortisone 2.5 % cream Apply topically 2 (two) times daily. 08/24/15   Truitt Merle, MD  lidocaine-prilocaine (EMLA) cream Apply 1 application topically as needed. Apply to [facility] cath at least one hour before needle stick. 07/26/15   Truitt Merle, MD  lisinopril-hydrochlorothiazide (PRINZIDE,ZESTORETIC) 10-12.5 MG tablet TAKE ONE TABLET BY MOUTH ONCE DAILY 10/02/15   Volanda Napoleon, MD  loperamide (IMODIUM) 2 MG capsule Take 2 capsules (4 mg total) by mouth 4 (four) times daily as needed for diarrhea or loose stools (NO MORE THAN 8 TABLETS PER DAY.). 10/17/15   Truitt Merle, MD  magic mouthwash SOLN Take 5 mLs by mouth 4 (four) times  daily. Patient taking differently: Take 5 mLs by mouth 4 (four) times daily as needed.  10/31/15   Rondel Jumbo, PA-C  magnesium oxide (MAG-OX) 400 (241.3 MG) MG tablet Take 1 tablet (400 mg total) by mouth 2 (two) times daily. 08/23/15   Owens Shark, NP  metFORMIN (GLUCOPHAGE) 500 MG tablet Take 1 tablet (500 mg total) by mouth 2 (two) times daily with a meal. THIS  IS A ONE TIME ORDER.  FUTURE REFILLS NEED TO BE DONE BY PRIMARY MD. 10/17/15   Truitt Merle, MD  metroNIDAZOLE (FLAGYL) 500 MG tablet Take 1 tablet (500 mg total) by mouth every 8 (eight) hours. 06/29/15   Belkys A Regalado, MD  minocycline (MINOCIN) 100 MG capsule Take 1 capsule (100 mg total) by mouth 2 (two) times daily. 08/23/15   Owens Shark, NP  nadolol (CORGARD) 20 MG tablet Take 1 tablet (20 mg total) by mouth 2 (two) times daily. THIS IS A ONE TIME ORDER.  FUTURE REFILLS NEED TO BE DONE BY PRIMARY MD. 10/17/15   Truitt Merle, MD  ondansetron (ZOFRAN) 8 MG tablet Take 1 tablet (8 mg total) by mouth every 8 (eight) hours as needed for nausea or vomiting. 10/17/15   Truitt Merle, MD  pantoprazole (PROTONIX) 40 MG tablet Take 1 tablet (40 mg total) by mouth daily. 10/17/15   Truitt Merle, MD  prochlorperazine (COMPAZINE) 10 MG tablet Take 1 tablet (10 mg total) by mouth every 6 (six) hours as needed for nausea or vomiting. 10/31/15   Coralee Pesa Wertman, PA-C   BP 77/48 mmHg  Pulse 94  Temp(Src) 98.4 F (36.9 C) (Oral)  Resp 20  SpO2 96% Physical Exam  Constitutional: He appears well-developed and well-nourished.  HENT:  Head: Normocephalic and atraumatic.  Eyes: Pupils are equal, round, and reactive to light.  Neck: Normal range of motion.  Cardiovascular: Normal rate.   Pulmonary/Chest: Effort normal. No respiratory distress. He has rales.  Abdominal: Soft. He exhibits no distension. There is no tenderness.  Musculoskeletal: Normal range of motion. He exhibits no edema or tenderness.  Neurological: He is alert.  Skin: Skin is warm and dry.  Nursing note and vitals reviewed.   ED Course  Procedures (including critical care time)  CRITICAL CARE Performed by: Merrily Pew   Total critical care time: 30 minutes Critical care time was exclusive of separately billable procedures and treating other patients. Critical care was necessary to treat or prevent imminent or life-threatening  deterioration. Critical care was time spent personally by me on the following activities: development of treatment plan with patient and/or surrogate as well as nursing, discussions with consultants, evaluation of patient's response to treatment, examination of patient, obtaining history from patient or surrogate, ordering and performing treatments and interventions, ordering and review of laboratory studies, ordering and review of radiographic studies, pulse oximetry and re-evaluation of patient's condition.   Labs Review Labs Reviewed  COMPREHENSIVE METABOLIC PANEL - Abnormal; Notable for the following:    Sodium 133 (*)    CO2 20 (*)    BUN 23 (*)    Calcium 7.5 (*)    Total Protein 5.5 (*)    Albumin 2.6 (*)    AST 42 (*)    All other components within normal limits  CBC WITH DIFFERENTIAL/PLATELET - Abnormal; Notable for the following:    WBC 1.5 (*)    RBC 2.73 (*)    Hemoglobin 8.1 (*)    HCT 23.3 (*)    Platelets  89 (*)    Neutro Abs 1.0 (*)    Lymphs Abs 0.3 (*)    All other components within normal limits  COMPREHENSIVE METABOLIC PANEL - Abnormal; Notable for the following:    Sodium 134 (*)    CO2 21 (*)    Glucose, Bld 233 (*)    Calcium 8.1 (*)    Total Protein 5.9 (*)    Albumin 2.5 (*)    All other components within normal limits  CBC - Abnormal; Notable for the following:    WBC 1.8 (*)    RBC 2.87 (*)    Hemoglobin 8.6 (*)    HCT 25.0 (*)    Platelets 103 (*)    All other components within normal limits  INFLUENZA PANEL BY PCR (TYPE A & B, H1N1) - Abnormal; Notable for the following:    Influenza A By PCR POSITIVE (*)    All other components within normal limits  MAGNESIUM - Abnormal; Notable for the following:    Magnesium 1.1 (*)    All other components within normal limits  MAGNESIUM - Abnormal; Notable for the following:    Magnesium 1.4 (*)    All other components within normal limits  GLUCOSE, CAPILLARY - Abnormal; Notable for the following:     Glucose-Capillary 163 (*)    All other components within normal limits  GLUCOSE, CAPILLARY - Abnormal; Notable for the following:    Glucose-Capillary 139 (*)    All other components within normal limits  CBG MONITORING, ED - Abnormal; Notable for the following:    Glucose-Capillary 139 (*)    All other components within normal limits  CULTURE, BLOOD (ROUTINE X 2)  CULTURE, BLOOD (ROUTINE X 2)  URINE CULTURE  MRSA PCR SCREENING  URINALYSIS, ROUTINE W REFLEX MICROSCOPIC (NOT AT [facility])  PHOSPHORUS  CBC WITH DIFFERENTIAL/PLATELET  OCCULT BLOOD X 1 CARD TO LAB, STOOL  I-STAT CG4 LACTIC ACID, ED  POC OCCULT BLOOD, ED  I-STAT CG4 LACTIC ACID, ED    Imaging Review Dg Chest 2 View  11/08/2015  CLINICAL DATA:  History of colon cancer for blood transfusion. EXAM: CHEST  2 VIEW COMPARISON:  02/08/2015 FINDINGS: The heart is mildly enlarged but stable. Mild tortuosity of the thoracic aorta. The right IJ power port is in stable position. No complicating features. Low lung volumes with vascular crowding and streaky atelectasis. Possible mild central vascular congestion but no overt pulmonary edema or pleural effusions. IMPRESSION: Low lung volumes with vascular crowding and streaky bibasilar atelectasis. Vascular congestion without overt pulmonary edema. Electronically Signed   By: Marijo Sanes M.D.   On: 11/08/2015 16:56   I have personally reviewed and evaluated these images and lab results as part of my medical decision-making.   EKG Interpretation   Date/Time:  Wednesday November 08 2015 16:27:50 EST Ventricular Rate:  82 PR Interval:  188 QRS Duration: 98 QT Interval:  408 QTC Calculation: 476 R Axis:   25 Text Interpretation:  Sinus rhythm Borderline low voltage, extremity leads  Borderline prolonged QT interval ED PHYSICIAN INTERPRETATION AVAILABLE IN  [facility] Confirmed by TEST, Record ([id number]) on 11/09/2015 7:06:50 AM      MDM   Final diagnoses:  Cancer of sigmoid ([place])   Hypotension, unspecified hypotension type  Sepsis, due to unspecified organism ([place])  Leukopenia due to antineoplastic chemotherapy   Hypotensive, cancer patient, couple days of cough, 100.4 temp yesterday, today found to have Hb 7.5, cancer center started transfusion and transferred him. Exam  benign aside from some vesicles on his neck and slight rales in all lung fields.   Sepsis v GI bleed v adrenal insufficiency. H/O heart failure with EF of 20-25% so initially ginger on fluid administeration however if the cause was sepsis I needed to ensure he got adequate resucitation, So started on fluids, abx, blood already hanging and given solucortef. BP improved to [id number] systolic. No obvious cause found on labs. BP stable for an hour or so, so admitted to medicine for further management.     Merrily Pew, MD 11/09/15 1610

## 2015-11-08 NOTE — Progress Notes (Signed)
Per [facility] ondansetron was approved 10/29/15-10/27/2221. I sent to medical records

## 2015-11-08 NOTE — Progress Notes (Signed)
ANTIBIOTIC CONSULT NOTE - INITIAL  Pharmacy Consult for Vancomycin & Zosyn Indication: rule out pneumonia and rule out sepsis  No Known Allergies  Patient Measurements:   Total body weight: 62.8 kg  Vital Signs: Temp: 98.4 F (36.9 C) (01/11 1512) Temp Source: Oral (01/11 1512) BP: 77/48 mmHg (01/11 1512) Pulse Rate: 94 (01/11 1512) Intake/Output from previous day:   Intake/Output from this shift:    Labs:  Recent Labs  11/07/15 1319 11/07/15 1614  WBC  --  2.5*  HGB  --  7.5*  PLT  --  157  CREATININE 1.2  --    Estimated Creatinine Clearance: 52 mL/min (by C-G formula based on Cr of 1.2). No results for input(s): VANCOTROUGH, VANCOPEAK, VANCORANDOM, GENTTROUGH, GENTPEAK, GENTRANDOM, TOBRATROUGH, TOBRAPEAK, TOBRARND, AMIKACINPEAK, AMIKACINTROU, AMIKACIN in the last 72 hours.   Microbiology: No results found for this or any previous visit (from the past 720 hour(s)).  Medical History: Past Medical History  Diagnosis Date  . Tachycardia   . Abnormal EKG   . Hypertension   . Non-ischemic cardiomyopathy ([place])   . Hepatitis B   . H. pylori infection   . Gastric ulcer   . Metastatic colon cancer to liver ([place])   . Diabetes mellitus without complication ([place])     metformin   Medications:  Scheduled:   Anti-infectives    Start     Dose/Rate Route Frequency Ordered Stop   11/08/15 1530  piperacillin-tazobactam (ZOSYN) IVPB 3.375 g     3.375 g 100 mL/hr over 30 Minutes Intravenous  Once 11/08/15 1517     11/08/15 1530  vancomycin (VANCOCIN) IVPB 1000 mg/200 mL premix     1,000 mg 200 mL/hr over 60 Minutes Intravenous  Once 11/08/15 1517       Assessment: 41 [facility]M [facility]10 for Magnesium infusion, noted low-grade temp and productive cough. During planned PRBC infusion 1/11: patient hypotensive, to ED for fluids, Vancomycin and Zosyn x1 in ED, continue per pharmacy dosing.  Goal of Therapy:  Vancomycin trough level 15-20 mcg/ml  Plan:   Vancomycin 1gm x1  in ED, followed by 500mg  q12  Zosyn 3.375gm q8hr, 4 hr infusion  Follow cultures, renal function, clinical course.  Vancomycin trough at steady state if warranted  Minda Ditto PharmD Pager [phone] 11/08/2015, 4:47 PM

## 2015-11-09 DIAGNOSIS — J101 Influenza due to other identified influenza virus with other respiratory manifestations: Secondary | ICD-10-CM | POA: Diagnosis present

## 2015-11-09 DIAGNOSIS — E119 Type 2 diabetes mellitus without complications: Secondary | ICD-10-CM

## 2015-11-09 LAB — MAGNESIUM: Magnesium: 1.1 mg/dL — ABNORMAL LOW (ref 1.7–2.4)

## 2015-11-09 LAB — COMPREHENSIVE METABOLIC PANEL
ALK PHOS: 118 U/L (ref 38–126)
ALT: 25 U/L (ref 17–63)
ANION GAP: 7 (ref 5–15)
AST: 30 U/L (ref 15–41)
Albumin: 2.5 g/dL — ABNORMAL LOW (ref 3.5–5.0)
BILIRUBIN TOTAL: 0.7 mg/dL (ref 0.3–1.2)
BUN: 16 mg/dL (ref 6–20)
CALCIUM: 8.1 mg/dL — AB (ref 8.9–10.3)
CO2: 21 mmol/L — ABNORMAL LOW (ref 22–32)
Chloride: 106 mmol/L (ref 101–111)
Creatinine, Ser: 0.83 mg/dL (ref 0.61–1.24)
GLUCOSE: 233 mg/dL — AB (ref 65–99)
Potassium: 4.3 mmol/L (ref 3.5–5.1)
Sodium: 134 mmol/L — ABNORMAL LOW (ref 135–145)
TOTAL PROTEIN: 5.9 g/dL — AB (ref 6.5–8.1)

## 2015-11-09 LAB — MRSA PCR SCREENING: MRSA by PCR: NEGATIVE

## 2015-11-09 LAB — GLUCOSE, CAPILLARY
Glucose-Capillary: 163 mg/dL — ABNORMAL HIGH (ref 65–99)
Glucose-Capillary: 139 mg/dL — ABNORMAL HIGH (ref 65–99)
Glucose-Capillary: 185 mg/dL — ABNORMAL HIGH (ref 65–99)
Glucose-Capillary: 208 mg/dL — ABNORMAL HIGH (ref 65–99)

## 2015-11-09 LAB — CBC
HCT: 25 % — ABNORMAL LOW (ref 39.0–52.0)
HEMOGLOBIN: 8.6 g/dL — AB (ref 13.0–17.0)
MCH: 30 pg (ref 26.0–34.0)
MCHC: 34.4 g/dL (ref 30.0–36.0)
MCV: 87.1 fL (ref 78.0–100.0)
Platelets: 103 10*3/uL — ABNORMAL LOW (ref 150–400)
RBC: 2.87 MIL/uL — AB (ref 4.22–5.81)
RDW: 14.7 % (ref 11.5–15.5)
WBC: 1.8 10*3/uL — AB (ref 4.0–10.5)

## 2015-11-09 LAB — URINE CULTURE: Organism ID, Bacteria: NO GROWTH

## 2015-11-09 LAB — INFLUENZA PANEL BY PCR (TYPE A & B)
H1N1 flu by pcr: NOT DETECTED
INFLAPCR: POSITIVE — AB
INFLBPCR: NEGATIVE

## 2015-11-09 LAB — PHOSPHORUS: PHOSPHORUS: 3.2 mg/dL (ref 2.5–4.6)

## 2015-11-09 MED ORDER — PREDNISONE 20 MG PO TABS
30.0000 mg | ORAL_TABLET | Freq: Every day | ORAL | Status: DC
  Administered 2015-11-09 – 2015-11-10 (×2): 30 mg via ORAL
  Filled 2015-11-09 (×4): qty 1

## 2015-11-09 MED ORDER — MAGNESIUM SULFATE 4 GM/100ML IV SOLN
4.0000 g | Freq: Once | INTRAVENOUS | Status: AC
  Administered 2015-11-09: 4 g via INTRAVENOUS
  Filled 2015-11-09: qty 100

## 2015-11-09 MED ORDER — HYDROCOD POLST-CPM POLST ER 10-8 MG/5ML PO SUER
5.0000 mL | Freq: Two times a day (BID) | ORAL | Status: DC | PRN
  Administered 2015-11-09: 5 mL via ORAL
  Filled 2015-11-09: qty 5

## 2015-11-09 MED ORDER — SODIUM CHLORIDE 0.9 % IV SOLN
INTRAVENOUS | Status: AC
  Administered 2015-11-09: 14:00:00 via INTRAVENOUS

## 2015-11-09 NOTE — Care Management Note (Signed)
Case Management Note  Patient Details  Name: Jason Reilly MRN: [id number] Date of Birth: Apr 21, 1946  Subjective/Objective:              Flu screen positive, hypotensive, wbc elevated, temp      Action/Plan:Date: November 09, 2015 Chart reviewed for concurrent status and case management needs. Will continue to follow patient for changes and needs: Velva Harman, RN, BSN, [place]   [phone]   Expected Discharge Date:   (unknown)               Expected Discharge Plan:  Home/Self Care  In-House Referral:  NA  Discharge planning Services     Post Acute Care Choice:  NA Choice offered to:  NA  DME Arranged:    DME Agency:     HH Arranged:    [place] Agency:     Status of Service:  In process, will continue to follow  Medicare Important Message Given:    Date Medicare IM Given:    Medicare IM give by:    Date Additional Medicare IM Given:    Additional Medicare Important Message give by:     If discussed at [place] of Stay Meetings, dates discussed:    Additional Comments:  Leeroy Cha, RN 11/09/2015, 10:31 AM

## 2015-11-09 NOTE — Progress Notes (Signed)
PROGRESS NOTE  Jason Reilly [id number] DOB: 1946/04/24 DOA: 11/08/2015 PCP: [name], MD  HPI: 70 y.o. 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  Subjective / 24 H Interval events - feeling better this morning, denies any chest pain, lightheadedness or dizziness  Assessment/Plan: Principal Problem:   Influenza A Active Problems:   Absolute anemia   Hypotension   Sepsis (HCC)   Acute on chronic systolic (congestive) heart failure (HCC)   Leukopenia due to antineoplastic chemotherapy   Cancer of sigmoid (HCC)   Diabetes mellitus, type 2 (HCC)   Severe Sepsis with hypotension with positive Influenza A - sepsis physiology improving, BP soft this morning but patient asymptomatic. - continue Tamiflu, today day 2/5, continue broad spectrum antibiotics, rapid narrow tomorrow if cultures remain negative.  - continue steroids, change to prednisone, quick taper  Metastatic sigmoid colon cancer with liver, lung and adrenal metastasis - He is seeing [name] - Chemotherapy as an outpatient.   History of gastric ulcer with significant GI bleed in 2016 - Repeat EGD in October 2016 with healing of his gastric ulcer  Diabetes mellitus - Last hemoglobin A1c was 5.5 in August 2016 and and 8.2 prior - Continue metformin, sliding scale insulin  Acute on chronic combined systolic and diastolic heart failure  - Most recent 2-D echo was in March 2016 showed an ejection fraction of 40-45% as well as grade 1 diastolic dysfunction. Thought to have NICM. - slow fluids to 50 cc / h, fluids to end this afternoon  Malnutrition  Anemia  - Likely multifactorial due to chemotherapy, prior GI bleed and iron deficiency - Hb stable this morning  Hypomagnesemia - due to panitumumab - Mg low 1.1, replete and  repeat in am   Hypertension - continue to hold home meds  Chronic diarrhea  - likely due to chemotherapy   Diet: Diet Carb Modified Fluid consistency:: Thin; Room service appropriate?: Yes Fluids: NS DVT Prophylaxis: SCD  Code Status: Full Code Family Communication: d/w wife bedside  Disposition Plan: transfer to telemetry, home when ready Barriers to discharge: IV antibiotics  Consultants:  None   Procedures:  None    Antibiotics Vancomycin 1/11 >> Zosyn 1/11 >> Tamiflu 1/11 >>   Studies  Dg Chest 2 View  11/08/2015  CLINICAL DATA:  History of colon cancer for blood transfusion. EXAM: CHEST  2 VIEW COMPARISON:  02/08/2015 FINDINGS: The heart is mildly enlarged but stable. Mild tortuosity of the thoracic aorta. The right IJ power port is in stable position. No complicating features. Low lung volumes with vascular crowding and streaky atelectasis. Possible mild central vascular congestion but no overt pulmonary edema or pleural effusions. IMPRESSION: Low lung volumes with vascular crowding and streaky bibasilar atelectasis. Vascular congestion without overt pulmonary edema. Electronically Signed   By: Marijo Sanes M.D.   On: 11/08/2015 16:56   Objective  Filed Vitals:   11/09/15 0845 11/09/15 0900 11/09/15 1000 11/09/15 1100  BP: 95/58     Pulse: 78 82 81 80  Temp:      TempSrc:      Resp: 20 18  21   Height:      Weight:      SpO2: 98% 99% 98% 97%    Intake/Output Summary (Last 24 hours) at 11/09/15 1230 Last data filed at 11/09/15 1100  Gross per 24  hour  Intake 3383.75 ml  Output      0 ml  Net 3383.75 ml   Filed Weights   11/08/15 1654  Weight: 63.05 kg (139 lb)    Exam:  GENERAL: NAD  HEENT: no scleral icterus, PERRL  NECK: supple, no LAD  LUNGS: CTA biL, no wheezing  HEART: RRR without MRG  ABDOMEN: soft, non tender  EXTREMITIES: no clubbing / cyanosis   Data Reviewed: Basic Metabolic Panel:  Recent Labs Lab 11/07/15 1319  11/08/15 1625 11/09/15 0330  NA 131* 133* 134*  K 4.3 4.9 4.3  CL  --  105 106  CO2 20* 20* 21*  GLUCOSE 126 92 233*  BUN 22.9 23* 16  CREATININE 1.2 1.12 0.83  CALCIUM 8.1* 7.5* 8.1*  MG 1.6 1.4* 1.1*  PHOS  --   --  3.2   Liver Function Tests:  Recent Labs Lab 11/08/15 1625 11/09/15 0330  AST 42* 30  ALT 23 25  ALKPHOS 126 118  BILITOT 0.9 0.7  PROT 5.5* 5.9*  ALBUMIN 2.6* 2.5*   CBC:  Recent Labs Lab 11/07/15 1614 11/08/15 2310 11/09/15 0330  WBC 2.5* 1.5* 1.8*  NEUTROABS 1.1* 1.0*  --   HGB 7.5* 8.1* 8.6*  HCT 22.3* 23.3* 25.0*  MCV 86.4 85.3 87.1  PLT 157 89* 103*   CBG:  Recent Labs Lab 11/08/15 2232 11/09/15 0756  GLUCAP 139* 163*    Recent Results (from the past 240 hour(s))  Blood culture (routine single)     Status: None (Preliminary result)   Collection Time: 11/07/15  4:17 PM  Result Value Ref Range Status   BLOOD CULTURE, ROUTINE Preliminary report  Preliminary   RESULT 1 Comment  Preliminary    Comment: No growth detected at this time.  Urine Culture     Status: None   Collection Time: 11/07/15  4:41 PM  Result Value Ref Range Status   Urine Culture, Routine Final report  Final   Urine Culture result 1 No growth  Final  Blood Culture (routine x 2)     Status: None (Preliminary result)   Collection Time: 11/08/15  3:45 PM  Result Value Ref Range Status   Specimen Description BLOOD RIGHT ANTECUBITAL  Final   Special Requests   Final    BOTTLES DRAWN AEROBIC ONLY 3 CC Performed at [facility]    Culture PENDING  Incomplete   Report Status PENDING  Incomplete  Blood Culture (routine x 2)     Status: None (Preliminary result)   Collection Time: 11/08/15  4:25 PM  Result Value Ref Range Status   Specimen Description BLOOD RIGHT ANTECUBITAL  Final   Special Requests BOTTLES DRAWN AEROBIC ONLY 5 ML  Final   Culture   Final    NO GROWTH < 24 HOURS Performed at [facility]    Report Status PENDING  Incomplete  MRSA  PCR Screening     Status: None   Collection Time: 11/09/15 12:43 AM  Result Value Ref Range Status   MRSA by PCR NEGATIVE NEGATIVE Final    Comment:        The GeneXpert MRSA Assay (FDA approved for NASAL specimens only), is one component of a comprehensive MRSA colonization surveillance program. It is not intended to diagnose MRSA infection nor to guide or monitor treatment for MRSA infections.      Scheduled Meds: . folic acid  1 mg Oral Daily  . insulin aspart  0-5 Units Subcutaneous QHS  .  insulin aspart  0-9 Units Subcutaneous TID WC  . magic mouthwash  5 mL Oral QID  . metFORMIN  500 mg Oral BID WC  . methylPREDNISolone (SOLU-MEDROL) injection  60 mg Intravenous Q12H  . oseltamivir  75 mg Oral BID  . pantoprazole  40 mg Oral Daily  . piperacillin-tazobactam (ZOSYN)  IV  3.375 g Intravenous Q8H  . sodium chloride  3 mL Intravenous Q12H  . vancomycin  500 mg Intravenous Q12H   Continuous Infusions: . sodium chloride 75 mL/hr at 11/09/15 [place], MD [facility] Pager [phone]. If 7 PM - 7 AM, please contact night-coverage at [email or web address], password [facility] 11/09/2015, 12:30 PM  LOS: 1 day

## 2015-11-10 ENCOUNTER — Other Ambulatory Visit: Payer: Self-pay | Admitting: Hematology

## 2015-11-10 DIAGNOSIS — C187 Malignant neoplasm of sigmoid colon: Secondary | ICD-10-CM

## 2015-11-10 LAB — CBC
HCT: 23.7 % — ABNORMAL LOW (ref 39.0–52.0)
Hemoglobin: 8.2 g/dL — ABNORMAL LOW (ref 13.0–17.0)
MCH: 29.6 pg (ref 26.0–34.0)
MCHC: 34.6 g/dL (ref 30.0–36.0)
MCV: 85.6 fL (ref 78.0–100.0)
PLATELETS: 138 10*3/uL — AB (ref 150–400)
RBC: 2.77 MIL/uL — ABNORMAL LOW (ref 4.22–5.81)
RDW: 14.7 % (ref 11.5–15.5)
WBC: 3.2 10*3/uL — AB (ref 4.0–10.5)

## 2015-11-10 LAB — BASIC METABOLIC PANEL
Anion gap: 8 (ref 5–15)
BUN: 17 mg/dL (ref 6–20)
CHLORIDE: 109 mmol/L (ref 101–111)
CO2: 21 mmol/L — ABNORMAL LOW (ref 22–32)
CREATININE: 0.89 mg/dL (ref 0.61–1.24)
Calcium: 8.2 mg/dL — ABNORMAL LOW (ref 8.9–10.3)
GFR calc non Af Amer: >60 mL/min (ref >60)
Glucose, Bld: 186 mg/dL — ABNORMAL HIGH (ref 65–99)
POTASSIUM: 4.3 mmol/L (ref 3.5–5.1)
SODIUM: 138 mmol/L (ref 135–145)

## 2015-11-10 LAB — GLUCOSE, CAPILLARY
GLUCOSE-CAPILLARY: 147 mg/dL — AB (ref 65–99)
Glucose-Capillary: 200 mg/dL — ABNORMAL HIGH (ref 65–99)

## 2015-11-10 LAB — MAGNESIUM: Magnesium: 1.2 mg/dL — ABNORMAL LOW (ref 1.7–2.4)

## 2015-11-10 MED ORDER — HEPARIN SOD (PORK) LOCK FLUSH 100 UNIT/ML IV SOLN
500.0000 [IU] | INTRAVENOUS | Status: AC | PRN
  Administered 2015-11-10: 500 [IU]

## 2015-11-10 MED ORDER — MAGNESIUM SULFATE 4 GM/100ML IV SOLN
4.0000 g | Freq: Once | INTRAVENOUS | Status: AC
  Administered 2015-11-10: 4 g via INTRAVENOUS
  Filled 2015-11-10: qty 100

## 2015-11-10 MED ORDER — OSELTAMIVIR PHOSPHATE 75 MG PO CAPS: 75.0000 mg | ORAL_CAPSULE | Freq: Two times a day (BID) | ORAL | Status: DC

## 2015-11-10 MED ORDER — SODIUM CHLORIDE 0.9 % IJ SOLN
10.0000 mL | INTRAMUSCULAR | Status: DC | PRN
  Administered 2015-11-10 (×2): 10 mL
  Filled 2015-11-10: qty 40

## 2015-11-10 MED ORDER — ENSURE ENLIVE PO LIQD: 237.0000 mL | Freq: Two times a day (BID) | ORAL | Status: DC

## 2015-11-10 MED ORDER — PREDNISONE 10 MG PO TABS: 20.0000 mg | ORAL_TABLET | Freq: Every day | ORAL | Status: DC

## 2015-11-10 MED ORDER — LEVOFLOXACIN 500 MG PO TABS: 500.0000 mg | ORAL_TABLET | Freq: Every day | ORAL | Status: DC

## 2015-11-10 NOTE — Progress Notes (Signed)
Initial Nutrition Assessment  DOCUMENTATION CODES:   Non-severe (moderate) malnutrition in context of chronic illness  INTERVENTION:  -Ensure Enlive po BID, each supplement provides 350 kcal and 20 grams of protein -RD to continue to monitor for needs NUTRITION DIAGNOSIS:   Increased nutrient needs related to catabolic illness, chronic illness, cancer and cancer related treatments as evidenced by estimated needs   GOAL:   Patient will meet greater than or equal to 90% of their needs  MONITOR:   PO intake, Labs, I & O's, Supplement acceptance  REASON FOR ASSESSMENT:   Malnutrition Screening Tool    ASSESSMENT:   Jason Reilly is a 70 y.o. 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 Burr Reilly from oncology, currently undergoing chemotherapy, is being brought from the [facility] for hypotension and weakness. He was seen there yesterday and was given magnesium for hypomagnesemia, and yesterday he was borderline hypotensive with a blood pressure of 97/50, heart rate of 108 as well as a temperature of 100.4. He was feeling asymptomatic at that time.  Spoke with pt at bedside w/ wife. Pt reports that he is eating better during his stay. Admits to poor PO intake prior to admission r/t nausea/vomiting/weakness. No PO intake on file in chart at this time.  Endorses weight loss but unsure of how much and in what time span. Per chat, pt's weight appears stable x4 months.   Nutrition-Focused physical exam completed. Findings are moderate fat depletion, mild muscle depletion, and no edema.   Denies any taste changes or other cancer/chemo related side effects.  Labs: CBGs 147-208; Mg 1.2 Medications reviewed.   Diet Order:  Diet Carb Modified Fluid consistency:: Thin; Room service appropriate?: Yes  Skin:  Reviewed, no issues  Last BM:  11/07/2015  Height:   Ht Readings from Last 1 Encounters:   11/08/15 5\' 5"  (1.651 m)    Weight:   Wt Readings from Last 1 Encounters:  11/08/15 139 lb (63.05 kg)    Ideal Body Weight:  61.81 kg  BMI:  Body mass index is 23.13 kg/(m^2).  Estimated Nutritional Needs:   Kcal:  2200-2500 calories  Protein:  65-105 grams  Fluid:  >/= 2.2L  EDUCATION NEEDS:   No education needs identified at this time  Jason Anis. Genea Rheaume, MS, RD LDN After Hours/Weekend Pager [phone]

## 2015-11-10 NOTE — Discharge Summary (Signed)
Physician Discharge Summary  Aithan Baccam [id number] DOB: August 22, 1946 DOA: 11/08/2015  PCP: [name], MD  Admit date: 11/08/2015 Discharge date: 11/10/2015  Time spent: > 30 minutes  Recommendations for Outpatient Follow-up:  1. Follow up with [name] as scheduled    Discharge Diagnoses:  Principal Problem:   Influenza A Active Problems:   Absolute anemia   Hypotension   Sepsis ([place])   Acute on chronic systolic (congestive) heart failure (HCC)   Leukopenia due to antineoplastic chemotherapy   Cancer of sigmoid (HCC)   Diabetes mellitus, type 2 ([place])  Discharge Condition: stable  Diet recommendation: regular  Filed Weights   11/08/15 1654  Weight: 63.05 kg (139 lb)    History of present illness:  See H&P, Labs, Consult and Test reports for all details in brief, patient is a 71 y.o. 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  Hospital Course:  Severe Sepsis with hypotension with positive Influenza A - patient was initially admitted to step down due to hypotension and was started on broad-spectrum antibiotics for presumed upper respiratory infection given cough at home as well as sick contacts. He was started empirically on Tamiflu and an influenza panel was sent, and it returned positive for influenza A infection. Blood cultures as well as respiratory cultures were obtained without any growth up until discharge. He hypotension slowly resolved with antibiotics and supportive treatment. On the day of discharge, patient was feeling back to baseline, his blood pressures normalized, he is ready received 2 days of broad-spectrum IV antibiotics as well as Tamiflu, he was discharged home in stable condition to complete 3 additional days of Tamiflu as well as 3 additional days of Levaquin. He was given  steroids given adrenal metastasis on admission, and he was also discharged on a quick prednisone taper. His blood pressure medications were held on discharge to prevent further hypotensive episodes, and he will need to be reevaluated as an outpatient and if his blood pressure continues to climb he will need then reintroduced. Metastatic sigmoid colon cancer with liver, lung and adrenal metastasis -  outpatient follow-up  History of gastric ulcer with significant GI bleed in 2016 - Repeat EGD in October 2016 with healing of his gastric ulcer. Blood counts have been stable while hospitalized. Clinically without further GI bleeding. Diabetes mellitus - Last hemoglobin A1c was 5.5 in August 2016 and and 8.2 prior Acute on chronic combined systolic and diastolic heart failure - Most recent 2-D echo was in March 2016 showed an ejection fraction of 40-45% as well as grade 1 diastolic dysfunction. Thought to have NICM. patient was hydrated with normal saline as he appeared quite dehydrated on admission, responded well, there were no signs of fluid overload and tolerated fluids well, he was euvolemic on discharge. Malnutrition Anemia - Likely multifactorial due to chemotherapy, prior GI bleed and iron deficiency, Hb stable Hypomagnesemia - due to panitumumab, his magnesium was repleted IV while hospitalized, he is to resume his oral magnesium on discharge  Hypertension - continue to hold home meds Chronic diarrhea - likely due to chemotherapy  Procedures:  None    Consultations:  None   Discharge Exam: Filed Vitals:   11/09/15 1330 11/09/15 1436 11/09/15 2119 11/10/15 1001  BP: 107/53 99/52 109/63 129/72  Pulse:  77 77 79  Temp:  97.4 F (36.3 C) 97.7  F (36.5 C) 97.5 F (36.4 C)  TempSrc:  Axillary Oral Oral  Resp: 21 20 20 20   Height:      Weight:      SpO2:  98% 100% 100%    General: NAD Cardiovascular: RRR Respiratory: CTA biL  Discharge Instructions Activity:  As tolerated    Get Medicines reviewed and adjusted: Please take all your medications with you for your next visit with your Primary MD  Please request your Primary MD to go over all hospital tests and procedure/radiological results at the follow up, please ask your Primary MD to get all Hospital records sent to his/her office.  If you experience worsening of your admission symptoms, develop shortness of breath, life threatening emergency, suicidal or homicidal thoughts you must seek medical attention immediately by calling 911 or calling your MD immediately if symptoms less severe.  You must read complete instructions/literature along with all the possible adverse reactions/side effects for all the Medicines you take and that have been prescribed to you. Take any new Medicines after you have completely understood and accpet all the possible adverse reactions/side effects.   Do not drive when taking Pain medications.   Do not take more than prescribed Pain, Sleep and Anxiety Medications  Special Instructions: If you have smoked or chewed Tobacco in the last 2 yrs please stop smoking, stop any regular Alcohol and or any Recreational drug use.  Wear Seat belts while driving.  Please note  You were cared for by a hospitalist during your hospital stay. Once you are discharged, your primary care physician will handle any further medical issues. Please note that NO REFILLS for any discharge medications will be authorized once you are discharged, as it is imperative that you return to your primary care physician (or establish a relationship with a primary care physician if you do not have one) for your aftercare needs so that they can reassess your need for medications and monitor your lab values.    Medication List    STOP taking these medications        lisinopril-hydrochlorothiazide 10-12.5 MG tablet  Commonly known as:  PRINZIDE,ZESTORETIC      TAKE these medications        clindamycin 1 % gel   Commonly known as:  CLINDAGEL  Apply topically 2 (two) times daily.     folic acid 1 MG tablet  Commonly known as:  FOLVITE  Take 1 tablet (1 mg total) by mouth daily.     levofloxacin 500 MG tablet  Commonly known as:  LEVAQUIN  Take 1 tablet (500 mg total) by mouth daily.     lidocaine-prilocaine cream  Commonly known as:  EMLA  Apply 1 application topically as needed. Apply to [facility] cath at least one hour before needle stick.     loperamide 2 MG capsule  Commonly known as:  IMODIUM  Take 2 capsules (4 mg total) by mouth 4 (four) times daily as needed for diarrhea or loose stools (NO MORE THAN 8 TABLETS PER DAY.).     magic mouthwash Soln  Take 5 mLs by mouth 4 (four) times daily.     magnesium oxide 400 (241.3 Mg) MG tablet  Commonly known as:  MAG-OX  Take 1 tablet (400 mg total) by mouth 2 (two) times daily.     metFORMIN 500 MG tablet  Commonly known as:  GLUCOPHAGE  Take 1 tablet (500 mg total) by mouth 2 (two) times daily with a meal. THIS IS A  ONE TIME ORDER.  FUTURE REFILLS NEED TO BE DONE BY PRIMARY MD.     nadolol 20 MG tablet  Commonly known as:  CORGARD  Take 1 tablet (20 mg total) by mouth 2 (two) times daily. THIS IS A ONE TIME ORDER.  FUTURE REFILLS NEED TO BE DONE BY PRIMARY MD.     ondansetron 8 MG tablet  Commonly known as:  ZOFRAN  Take 1 tablet (8 mg total) by mouth every 8 (eight) hours as needed for nausea or vomiting.     oseltamivir 75 MG capsule  Commonly known as:  TAMIFLU  Take 1 capsule (75 mg total) by mouth 2 (two) times daily.     pantoprazole 40 MG tablet  Commonly known as:  PROTONIX  Take 1 tablet (40 mg total) by mouth daily.     predniSONE 10 MG tablet  Commonly known as:  DELTASONE  Take 2 tablets (20 mg total) by mouth daily with breakfast. For 2 days then 1 tablet for 2 days     prochlorperazine 10 MG tablet  Commonly known as:  COMPAZINE  Take 1 tablet (10 mg total) by mouth every 6 (six) hours as needed for nausea or  vomiting.         The results of significant diagnostics from this hospitalization (including imaging, microbiology, ancillary and laboratory) are listed below for reference.    Significant Diagnostic Studies: Dg Chest 2 View  11/08/2015  CLINICAL DATA:  History of colon cancer for blood transfusion. EXAM: CHEST  2 VIEW COMPARISON:  02/08/2015 FINDINGS: The heart is mildly enlarged but stable. Mild tortuosity of the thoracic aorta. The right IJ power port is in stable position. No complicating features. Low lung volumes with vascular crowding and streaky atelectasis. Possible mild central vascular congestion but no overt pulmonary edema or pleural effusions. IMPRESSION: Low lung volumes with vascular crowding and streaky bibasilar atelectasis. Vascular congestion without overt pulmonary edema. Electronically Signed   By: Marijo Sanes M.D.   On: 11/08/2015 16:56   Dg Outside Films Spine  11/08/2015  CLINICAL DATA:  This exam is stored here for comparison purposes only and was performed at an outside facility.   Please contact the originating institution for any associated interpretation or report.    Microbiology: Recent Results (from the past 240 hour(s))  Blood culture (routine single)     Status: None (Preliminary result)   Collection Time: 11/07/15  4:17 PM  Result Value Ref Range Status   BLOOD CULTURE, ROUTINE Preliminary report  Preliminary   RESULT 1 Comment  Preliminary    Comment: No growth in 36 - 48 hours.  Urine Culture     Status: None   Collection Time: 11/07/15  4:41 PM  Result Value Ref Range Status   Urine Culture, Routine Final report  Final   Urine Culture result 1 No growth  Final  Blood Culture (routine x 2)     Status: None (Preliminary result)   Collection Time: 11/08/15  3:45 PM  Result Value Ref Range Status   Specimen Description BLOOD RIGHT ANTECUBITAL  Final   Special Requests BOTTLES DRAWN AEROBIC ONLY 3 CC  Final   Culture   Final    NO GROWTH < 24  HOURS Performed at [facility]    Report Status PENDING  Incomplete  Blood Culture (routine x 2)     Status: None (Preliminary result)   Collection Time: 11/08/15  4:25 PM  Result Value Ref Range Status  Specimen Description BLOOD RIGHT ANTECUBITAL  Final   Special Requests BOTTLES DRAWN AEROBIC ONLY 5 ML  Final   Culture   Final    NO GROWTH < 24 HOURS Performed at [facility]    Report Status PENDING  Incomplete  Urine culture     Status: None   Collection Time: 11/08/15  6:10 PM  Result Value Ref Range Status   Specimen Description URINE, CLEAN CATCH  Final   Special Requests NONE  Final   Culture   Final    1,000 COLONIES/mL INSIGNIFICANT GROWTH Performed at [facility]    Report Status 11/09/2015 FINAL  Final  MRSA PCR Screening     Status: None   Collection Time: 11/09/15 12:43 AM  Result Value Ref Range Status   MRSA by PCR NEGATIVE NEGATIVE Final    Comment:        The GeneXpert MRSA Assay (FDA approved for NASAL specimens only), is one component of a comprehensive MRSA colonization surveillance program. It is not intended to diagnose MRSA infection nor to guide or monitor treatment for MRSA infections.      Labs: Basic Metabolic Panel:  Recent Labs Lab 11/07/15 1319 11/08/15 1625 11/09/15 0330 11/10/15 0515  NA 131* 133* 134* 138  K 4.3 4.9 4.3 4.3  CL  --  105 106 109  CO2 20* 20* 21* 21*  GLUCOSE 126 92 233* 186*  BUN 22.9 23* 16 17  CREATININE 1.2 1.12 0.83 0.89  CALCIUM 8.1* 7.5* 8.1* 8.2*  MG 1.6 1.4* 1.1* 1.2*  PHOS  --   --  3.2  --    Liver Function Tests:  Recent Labs Lab 11/08/15 1625 11/09/15 0330  AST 42* 30  ALT 23 25  ALKPHOS 126 118  BILITOT 0.9 0.7  PROT 5.5* 5.9*  ALBUMIN 2.6* 2.5*   CBC:  Recent Labs Lab 11/07/15 1614 11/08/15 2310 11/09/15 0330 11/10/15 0515  WBC 2.5* 1.5* 1.8* 3.2*  NEUTROABS 1.1* 1.0*  --   --   HGB 7.5* 8.1* 8.6* 8.2*  HCT 22.3* 23.3* 25.0* 23.7*  MCV 86.4  85.3 87.1 85.6  PLT 157 89* 103* 138*   CBG:  Recent Labs Lab 11/09/15 0756 11/09/15 1212 11/09/15 1709 11/09/15 2116 11/10/15 0758  GLUCAP 163* 139* 185* 208* 147*    Signed:  GHERGHE, COSTIN  [facility] 11/10/2015, 11:01 AM

## 2015-11-10 NOTE — Progress Notes (Signed)
Discharge instructions given to pt/daughter, verbalized understanding. Left the unit in stable condition. 

## 2015-11-12 LAB — TYPE AND SCREEN
ABO/RH(D): O POS
ANTIBODY SCREEN: NEGATIVE
UNIT DIVISION: 0
UNIT DIVISION: 0
Unit division: 0

## 2015-11-13 ENCOUNTER — Other Ambulatory Visit: Payer: Medicare Other

## 2015-11-13 ENCOUNTER — Ambulatory Visit: Payer: Medicare Other | Admitting: Hematology

## 2015-11-13 LAB — CULTURE, BLOOD (ROUTINE X 2)
Culture: NO GROWTH
Culture: NO GROWTH

## 2015-11-14 ENCOUNTER — Ambulatory Visit: Payer: Medicare Other

## 2015-11-15 LAB — CULTURE, BLOOD (SINGLE)

## 2015-11-17 ENCOUNTER — Other Ambulatory Visit: Payer: Medicare Other

## 2015-11-17 ENCOUNTER — Ambulatory Visit: Payer: Medicare Other | Admitting: Hematology

## 2015-11-20 ENCOUNTER — Telehealth: Payer: Self-pay | Admitting: Hematology

## 2015-11-20 ENCOUNTER — Encounter: Payer: Self-pay | Admitting: Hematology

## 2015-11-20 ENCOUNTER — Telehealth: Payer: Self-pay | Admitting: *Deleted

## 2015-11-20 ENCOUNTER — Ambulatory Visit (HOSPITAL_BASED_OUTPATIENT_CLINIC_OR_DEPARTMENT_OTHER): Payer: PPO

## 2015-11-20 ENCOUNTER — Other Ambulatory Visit (HOSPITAL_BASED_OUTPATIENT_CLINIC_OR_DEPARTMENT_OTHER): Payer: PPO

## 2015-11-20 ENCOUNTER — Ambulatory Visit (HOSPITAL_BASED_OUTPATIENT_CLINIC_OR_DEPARTMENT_OTHER): Payer: PPO | Admitting: Hematology

## 2015-11-20 VITALS — BP 116/69 | HR 77 | Temp 97.9°F | Resp 18 | Ht 65.0 in | Wt 139.4 lb

## 2015-11-20 DIAGNOSIS — D509 Iron deficiency anemia, unspecified: Secondary | ICD-10-CM

## 2015-11-20 DIAGNOSIS — D6481 Anemia due to antineoplastic chemotherapy: Secondary | ICD-10-CM

## 2015-11-20 DIAGNOSIS — C187 Malignant neoplasm of sigmoid colon: Secondary | ICD-10-CM | POA: Diagnosis not present

## 2015-11-20 DIAGNOSIS — Z5111 Encounter for antineoplastic chemotherapy: Secondary | ICD-10-CM | POA: Diagnosis not present

## 2015-11-20 DIAGNOSIS — C787 Secondary malignant neoplasm of liver and intrahepatic bile duct: Secondary | ICD-10-CM | POA: Diagnosis not present

## 2015-11-20 DIAGNOSIS — E46 Unspecified protein-calorie malnutrition: Secondary | ICD-10-CM

## 2015-11-20 DIAGNOSIS — D500 Iron deficiency anemia secondary to blood loss (chronic): Secondary | ICD-10-CM

## 2015-11-20 DIAGNOSIS — C786 Secondary malignant neoplasm of retroperitoneum and peritoneum: Secondary | ICD-10-CM | POA: Diagnosis not present

## 2015-11-20 DIAGNOSIS — C772 Secondary and unspecified malignant neoplasm of intra-abdominal lymph nodes: Secondary | ICD-10-CM

## 2015-11-20 DIAGNOSIS — C7972 Secondary malignant neoplasm of left adrenal gland: Secondary | ICD-10-CM

## 2015-11-20 DIAGNOSIS — I5042 Chronic combined systolic (congestive) and diastolic (congestive) heart failure: Secondary | ICD-10-CM

## 2015-11-20 DIAGNOSIS — Z5112 Encounter for antineoplastic immunotherapy: Secondary | ICD-10-CM | POA: Diagnosis not present

## 2015-11-20 DIAGNOSIS — C189 Malignant neoplasm of colon, unspecified: Secondary | ICD-10-CM

## 2015-11-20 DIAGNOSIS — E119 Type 2 diabetes mellitus without complications: Secondary | ICD-10-CM

## 2015-11-20 DIAGNOSIS — R21 Rash and other nonspecific skin eruption: Secondary | ICD-10-CM

## 2015-11-20 DIAGNOSIS — I10 Essential (primary) hypertension: Secondary | ICD-10-CM

## 2015-11-20 LAB — CBC WITH DIFFERENTIAL/PLATELET
BASO%: 0.4 % (ref 0.0–2.0)
BASOS ABS: 0 10*3/uL (ref 0.0–0.1)
EOS ABS: 0.1 10*3/uL (ref 0.0–0.5)
EOS%: 1 % (ref 0.0–7.0)
HEMATOCRIT: 28 % — AB (ref 38.4–49.9)
HGB: 9.3 g/dL — ABNORMAL LOW (ref 13.0–17.1)
LYMPH#: 0.7 10*3/uL — AB (ref 0.9–3.3)
LYMPH%: 12.8 % — AB (ref 14.0–49.0)
MCH: 30 pg (ref 27.2–33.4)
MCHC: 33.1 g/dL (ref 32.0–36.0)
MCV: 90.7 fL (ref 79.3–98.0)
MONO#: 0.5 10*3/uL (ref 0.1–0.9)
MONO%: 10.2 % (ref 0.0–14.0)
NEUT#: 3.9 10*3/uL (ref 1.5–6.5)
NEUT%: 75.6 % — AB (ref 39.0–75.0)
PLATELETS: 120 10*3/uL — AB (ref 140–400)
RBC: 3.09 10*6/uL — AB (ref 4.20–5.82)
RDW: 17.4 % — ABNORMAL HIGH (ref 11.0–14.6)
WBC: 5.2 10*3/uL (ref 4.0–10.3)

## 2015-11-20 LAB — COMPREHENSIVE METABOLIC PANEL
ALBUMIN: 3.1 g/dL — AB (ref 3.5–5.0)
ALK PHOS: 219 U/L — AB (ref 40–150)
ALT: 32 U/L (ref 0–55)
ANION GAP: 7 meq/L (ref 3–11)
AST: 44 U/L — AB (ref 5–34)
BUN: 18 mg/dL (ref 7.0–26.0)
CALCIUM: 9.2 mg/dL (ref 8.4–10.4)
CO2: 29 mEq/L (ref 22–29)
CREATININE: 1.1 mg/dL (ref 0.7–1.3)
Chloride: 104 mEq/L (ref 98–109)
EGFR: 70 mL/min/{1.73_m2} — ABNORMAL LOW (ref >90)
Glucose: 94 mg/dl (ref 70–140)
POTASSIUM: 4.9 meq/L (ref 3.5–5.1)
Sodium: 140 mEq/L (ref 136–145)
Total Bilirubin: 0.46 mg/dL (ref 0.20–1.20)
Total Protein: 6.7 g/dL (ref 6.4–8.3)

## 2015-11-20 LAB — IRON AND TIBC
%SAT: 11 % — ABNORMAL LOW (ref 20–55)
IRON: 22 ug/dL — AB (ref 42–163)
TIBC: 209 ug/dL (ref 202–409)
UIBC: 187 ug/dL (ref 117–376)

## 2015-11-20 LAB — MAGNESIUM: Magnesium: 1.4 mg/dl — CL (ref 1.5–2.5)

## 2015-11-20 MED ORDER — SODIUM CHLORIDE 0.9 % IV SOLN
Freq: Once | INTRAVENOUS | Status: AC
  Administered 2015-11-20: 11:00:00 via INTRAVENOUS
  Filled 2015-11-20: qty 4

## 2015-11-20 MED ORDER — ATROPINE SULFATE 1 MG/ML IJ SOLN
0.5000 mg | Freq: Once | INTRAMUSCULAR | Status: AC | PRN
  Administered 2015-11-20: 0.5 mg via INTRAVENOUS

## 2015-11-20 MED ORDER — LOPERAMIDE HCL 2 MG PO CAPS: 4.0000 mg | ORAL_CAPSULE | Freq: Four times a day (QID) | ORAL | Status: AC | PRN

## 2015-11-20 MED ORDER — MAGNESIUM OXIDE 400 (241.3 MG) MG PO TABS: 400.0000 mg | ORAL_TABLET | Freq: Three times a day (TID) | ORAL | Status: DC

## 2015-11-20 MED ORDER — CLINDAMYCIN PHOSPHATE 1 % EX GEL: Freq: Two times a day (BID) | CUTANEOUS | Status: AC

## 2015-11-20 MED ORDER — IRINOTECAN HCL CHEMO INJECTION 100 MG/5ML
160.0000 mg/m2 | Freq: Once | INTRAVENOUS | Status: AC
  Administered 2015-11-20: 272 mg via INTRAVENOUS
  Filled 2015-11-20: qty 13.6

## 2015-11-20 MED ORDER — SODIUM CHLORIDE 0.9 % IV SOLN
6.2000 mg/kg | Freq: Once | INTRAVENOUS | Status: AC
  Administered 2015-11-20: 400 mg via INTRAVENOUS
  Filled 2015-11-20: qty 20

## 2015-11-20 MED ORDER — HEPARIN SOD (PORK) LOCK FLUSH 100 UNIT/ML IV SOLN
500.0000 [IU] | Freq: Once | INTRAVENOUS | Status: AC | PRN
  Administered 2015-11-20: 500 [IU]
  Filled 2015-11-20: qty 5

## 2015-11-20 MED ORDER — LIDOCAINE-PRILOCAINE 2.5-2.5 % EX CREA: 1.0000 "application " | TOPICAL_CREAM | CUTANEOUS | Status: DC | PRN

## 2015-11-20 MED ORDER — FOLIC ACID 1 MG PO TABS: 1.0000 mg | ORAL_TABLET | Freq: Every day | ORAL | Status: DC

## 2015-11-20 MED ORDER — SODIUM CHLORIDE 0.9 % IV SOLN
Freq: Once | INTRAVENOUS | Status: AC
  Administered 2015-11-20: 11:00:00 via INTRAVENOUS

## 2015-11-20 MED ORDER — MAGNESIUM SULFATE 50 % IJ SOLN: 6.0000 g | Freq: Once | INTRAMUSCULAR | Status: DC

## 2015-11-20 MED ORDER — SODIUM CHLORIDE 0.9 % IV SOLN
6.0000 g | Freq: Once | INTRAVENOUS | Status: AC
  Administered 2015-11-20: 6 g via INTRAVENOUS
  Filled 2015-11-20: qty 250

## 2015-11-20 MED ORDER — SODIUM CHLORIDE 0.9 % IJ SOLN
10.0000 mL | INTRAMUSCULAR | Status: DC | PRN
  Administered 2015-11-20: 10 mL
  Filled 2015-11-20: qty 10

## 2015-11-20 MED ORDER — ATROPINE SULFATE 1 MG/ML IJ SOLN
INTRAMUSCULAR | Status: AC
  Filled 2015-11-20: qty 1

## 2015-11-20 NOTE — Telephone Encounter (Signed)
per pof to sch pt appt-sent MW email to sch pt trmt-pt to get updated copy of avs b4 leaving

## 2015-11-20 NOTE — Progress Notes (Addendum)
Jason Reilly  [phone] Fax:([phone]) [phone]  Clinic follow up Note   Patient Care Team: [name], MD as PCP - General (Specialist) [name], CRNP as Nurse Practitioner (Nurse Practitioner) Truitt Merle, MD as Consulting Physician (Hematology) Ladene Artist, MD as Consulting Physician (Gastroenterology) 11/20/2015  CHIEF COMPLAINTS Follow up metastatic sigmoid colon cancer  Oncology History   He is a Metastatic colon cancer to liver   Staging form: Colon and Rectum, AJCC 7th Edition     Clinical: Stage Unknown ([place], NX, M1) - Unsigned      Cancer of sigmoid colon ([place])   10/28/2014 Tumor Marker AFP 3.2 CEA > 10,000 CA 19-9 12,929.6. tumor (-) KRAS and NRAS mutation.    11/10/2014 Imaging PET scan showed hypermetabolic mass in the sigmoid colon was noted metastasis in the retroperitoneum. Probable left adrenal and pulmonary metastasis, and diffuse liver metastasis.   11/21/2014 Initial Diagnosis Metastatic colon cancer to liver, lung, abd nodes and left adrenal gland. Diagnosis was made by liver biopsy.    11/30/2014 -  Chemotherapy First line chemo [id number], Panitumumab added from second cycle    12/28/2014 - 12/31/2014 Hospital Admission Was admitted for dehydration, neutropenia fever with UTI, and severe skin rash.   02/08/2015 - 02/12/2015 Hospital Admission He was admitted for upper GI bleeding, e.g. showed a gastric ulcer with clots, status post appendectomy injection. He also received a blood transfusion.   03/01/2015 Tumor Marker CEA 694, CA19.9 462   03/13/2015 Imaging CT CAP showed partial response, no new lesions.    03/15/2015 - 05/23/2015 Chemotherapy restart FOLFOX, held again on 8/9 due to GI bleeding    05/31/2015 - 06/02/2015 Hospital Admission He was admitted to [facility] in [place] due to upper GI bleeding, EGD showed gastric and dudenal ulcers    06/27/2015 - 06/29/2015 Hospital Admission he was admitted for dairrhea and pancolitis, c-diff and  stool cultures were negative, treated with antibiotics   07/12/2015 -  Chemotherapy panitumumab 47m/kg, every 2 weeks   11/08/2015 - 11/10/2015 Hospital Admission Pt was admitted for sepsis and hypotension, was found to have (+) influenza A and treated.      HISTORY OF PRESENTING ILLNESS:  [name] 70y.o. male is here because of abnormal CT findings, which is very suspicious for malignancy. [place] He came in with his son and an interpreter.  He has been feeling fatigued since two month ago. He is still able to do all ADLs. He otherwise denies any pain, bloating or nausea.  He lost about 20lbs in 3 month. His appetite is lower than before, eats less, no change of his bowl habits.  She denied any hematochezia or melana. Per his son, he has had some personality changes daily, irritable, slightly confused some time.  He was evaluated by his primary care physician. Lab test reviewed hepatitis B infection, which he did not know before, and elevated alkaline phosphatase, his liver function and the rest of the liver function was not remarkable. [place] was obtained on 07/22/2014, which showed diffusely abnormal liver with multiple echogenic lesions. CT of abdomen with and without contrast was done on 08/26/2014, which reviewed here at a medically with multiple large partially calcified hepatic masses consistent with metastatic disease. Mild retroperitoneal adenopathy with the largest node measuring 1.6 cm. And nonspecific 1.4 cm left adrenal nodule was also noticed. His tumor marker showed CEA greater than  10,000, CA 19-9 12,929, AFP 3.2 (normal). He was referred to [facility] and was evaluated by nurse practitioner [name]. Treatment for hepatitis B was not recommended based on his virus load.  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 He was evaluated by a  cardiologist in 2014. He denies any significant dyspnea on exertion. No leg swollen.  CURRENT THERAPY: panitumumab 31m/kg, every 2 weeks, started on 07/12/2015, Irinotecan 1864mm2 every 2 weeks added on 09/19/2015, dose decreased to 16043m2 from cycle 2   INTERIM HISTORY: [place] for [place] and chemotherapy. He was [facility] to [facility] 11/08/2015 for sepsis and influenza A infection. He was treated and discharged home on 11/10/2015. Some of his family members also got flu at home. He has recovered very well. He denies any melena or hematochezia. He has good appetite and energy level, no other new complaints.  MEDICAL HISTORY:  Past Medical History  Diagnosis Date  . Tachycardia   . Abnormal EKG   . Hypertension   . Non-ischemic cardiomyopathy ([facility] . Hepatitis B   . H. pylori infection   . Gastric ulcer   . Metastatic colon cancer to liver ([place] . Diabetes mellitus without complication ([facility]   metformin    SURGICAL HISTORY: Past Surgical History  Procedure Laterality Date  . Nuclear stress test  03/03/2013    High risk - consistent with nonischemic cardiomyopathy  . Left and right heart catheterization with coronary angiogram N/A 03/29/2013    Procedure: LEFT AND RIGHT HEART CATHETERIZATION WITH CORONARY ANGIOGRAM;  Surgeon: KenPixie CasinoD;  [facility];  Service: Cardiovascular;  Laterality: N/A;  . Esophagogastroduodenoscopy N/A 02/08/2015    Procedure: ESOPHAGOGASTRODUODENOSCOPY (EGD);  Surgeon: MalLadene ArtistD;  [facility];  Service: Endoscopy;  Laterality: N/A;  . Esophagogastroduodenoscopy (egd) with propofol N/A 08/15/2015    Procedure: ESOPHAGOGASTRODUODENOSCOPY (EGD) WITH PROPOFOL;  Surgeon: MalLadene ArtistD;  [facility];  Service: Endoscopy;  Laterality: N/A;    SOCIAL HISTORY: Social History   Social History  . Marital Status: Married    Spouse Name: N/A  . Number of Children: N/A  . Years of  Education: N/A   Occupational History  . Not on file.   Social History Main Topics  . Smoking status: Former Smoker    Types: Cigarettes    Quit date: 11/07/2008  . Smokeless tobacco: Never Used  . Alcohol Use: Yes     Comment: occasional  . Drug Use: No  . Sexual Activity: No   Other Topics Concern  . Not on file   Social History Narrative   Married   Enjoys walking   [place] lived [place]    FAMILY HISTORY: No family history of liver disease or malignancy.  ALLERGIES:  has No Known Allergies.  MEDICATIONS:  Current Outpatient Prescriptions on File Prior to Visit  Medication Sig Dispense Refill  . clindamycin (CLINDAGEL) 1 % gel Apply topically 2 (two) times daily. 30 g 2  . magic mouthwash SOLN Take 5 mLs by mouth 4 (four) times daily. (Patient taking differently: Take 5 mLs by mouth 4 (four) times daily as needed. ) 120 mL 1  . metFORMIN (GLUCOPHAGE) 500 MG tablet Take 1 tablet (500 mg total) by mouth 2 (two) times daily with a meal. THIS IS A ONE TIME ORDER.  FUTURE REFILLS NEED TO BE DONE BY PRIMARY  MD. 90 tablet 0  . nadolol (CORGARD) 20 MG tablet Take 1 tablet (20 mg total) by mouth 2 (two) times daily. THIS IS A ONE TIME ORDER.  FUTURE REFILLS NEED TO BE DONE BY PRIMARY MD. 90 tablet 0  . ondansetron (ZOFRAN) 8 MG tablet Take 1 tablet (8 mg total) by mouth every 8 (eight) hours as needed for nausea or vomiting. 45 tablet 2  . oseltamivir (TAMIFLU) 75 MG capsule Take 1 capsule (75 mg total) by mouth 2 (two) times daily. 6 capsule 0  . pantoprazole (PROTONIX) 40 MG tablet Take 1 tablet (40 mg total) by mouth daily. 90 tablet 1  . prochlorperazine (COMPAZINE) 10 MG tablet Take 1 tablet (10 mg total) by mouth every 6 (six) hours as needed for nausea or vomiting. 30 tablet 1   Current Facility-Administered Medications on File Prior to Visit  Medication Dose Route Frequency Provider Last Rate Last Dose  . sodium chloride 0.9 % injection 10 mL  10 mL Intracatheter PRN  Truitt Merle, MD   10 mL at 10/03/15 1705  ;   REVIEW OF SYSTEMS:   Constitutional: Denies fevers, chills or abnormal night sweats, (+) fatigue  Eyes: Denies blurriness of vision, double vision or watery eyes Ears, nose, mouth, throat, and face: Denies mucositis or sore throat Respiratory: Denies cough, dyspnea or wheezes Cardiovascular: Denies palpitation, chest discomfort or lower extremity swelling Gastrointestinal: Denies nausea, heartburn or change in bowel habits Skin: Denies abnormal skin rashes Lymphatics: Denies new lymphadenopathy or easy bruising Neurological:Denies numbness, tingling or new weaknesses Behavioral/Psych: Mood is stable, no new changes, (+) insomnia All other systems were reviewed with the patient and are negative.  PHYSICAL EXAMINATION: BP 116/69 mmHg  Pulse 77  Temp(Src) 97.9 F (36.6 C) (Oral)  Resp 18  Ht _0  (1.651 m)  Wt 139 lb 6.4 oz (63.231 kg)  BMI 23.20 kg/m2  SpO2 100%  ECOG PERFORMANCE STATUS: 1 Vital sign were taken at in the infusion room, within normal limits. GENERAL:alert, no distress and comfortable SKIN: (+) Dry skin,  no skin ulcer, rashes or discharge. EYES: normal, conjunctiva are pink and non-injected, sclera clear OROPHARYNX:no exudate, no erythema and lips, buccal mucosa, and tongue with mild discoloration at the tip.  NECK: supple, thyroid normal size, non-tender, without nodularity LYMPH:  no palpable lymphadenopathy in the cervical, axillary or inguinal LUNGS: clear to auscultation and percussion with normal breathing effort, (+) crackles on b/l lung base  HEART: regular rate & rhythm and no murmurs and no lower extremity edema ABDOMEN:abdomen soft, non-tender, no hepatomegaly, no splenomegaly and normal bowel sounds Musculoskeletal:no cyanosis of digits and no clubbing  PSYCH: alert & oriented x 3 with fluent speech NEURO: no focal motor/sensory deficits  LABORATORY DATA:  I have reviewed the data as listed CBC Latest  Ref Rng 11/20/2015 11/10/2015 11/09/2015  WBC 4.0 - 10.3 10e3/uL 5.2 3.2(L) 1.8(L)  Hemoglobin 13.0 - 17.1 g/dL 9.3(L) 8.2(L) 8.6(L)  Hematocrit 38.4 - 49.9 % 28.0(L) 23.7(L) 25.0(L)  Platelets 140 - 400 10e3/uL 120(L) 138(L) 103(L)   ANC is 1.3  CMP Latest Ref Rng 11/10/2015 11/09/2015 11/08/2015  Glucose 65 - 99 mg/dL 186(H) 233(H) 92  BUN 6 - 20 mg/dL 17 16 23(H)  Creatinine 0.61 - 1.24 mg/dL 0.89 0.83 1.12  Sodium 135 - 145 mmol/L 138 134(L) 133(L)  Potassium 3.5 - 5.1 mmol/L 4.3 4.3 4.9  Chloride 101 - 111 mmol/L 109 106 105  CO2 22 - 32 mmol/L 21(L) 21(L) 20(L)  Calcium 8.9 - 10.3 mg/dL 8.2(L) 8.1(L) 7.5(L)  Total Protein 6.5 - 8.1 g/dL - 5.9(L) 5.5(L)  Total Bilirubin 0.3 - 1.2 mg/dL - 0.7 0.9  Alkaline Phos 38 - 126 U/L - 118 126  AST 15 - 41 U/L - 30 42(H)  ALT 17 - 63 U/L - 25 23   Mag 1.4 today   INITIAL tumor markers AFP 3.2 CEA > 10,000 CA 19-9 12,929.6  Results for RENOLD, KOZAR (MRN [id number]) as of 10/17/2015 07:30  Ref. Range 07/05/2015 08:11 08/09/2015 08:40 09/06/2015 09:57 10/03/2015 08:49  CA 19-9 Latest Ref Range: <35.0 U/mL 310.6 (H) 49.7 (H) 53.7 (H) 62.3 (H)  CEA Latest Ref Range: 0.0-5.0 ng/mL 386.8 (H) 96.3 (H) 73.7 (H) 66.4 (H)      Pathology report  Liver, needle/core biopsy - METASTATIC ADENOCARCINOMA, SEE COMMENT. Microscopic Comment The adenocarcinoma demonstrates the following immunophenotype: Cytokeratin 7 - negative expression. Cytokeratin 20 - strong diffuse expression. CD2 - strong diffuse expression. Overall the morphology and immunophenotype are that of metastatic adenocarcinoma primary to colorectum. The recent nuclear medicine scan demonstrating sigmoid mass with associated liver masses is noted.  [facility] test result:    RADIOGRAPHIC STUDIES: I have personally reviewed the outside CT scan image with patient and his son.   CT chest, abdomen and pelvis with IV contrast oN 08/16/2015 IMPRESSION: 1. Today's study demonstrates  stable to slightly improved metastatic disease in the liver, as discussed above. 2. Interval enlargement of a 1.7 cm right adrenal nodule, suspicious for progressive metastasis. 3. Primary lesion in the sigmoid colon appears similar to the prior study, but there is increasing colonic wall thickening throughout the descending colon. This is associated with some surrounding inflammatory changes, favored to reflect an acute colitis, although expansion of infiltrative neoplasm is difficult to entirely exclude. 4. Previously noted mildly enlarged left retroperitoneal lymph node is stable in size. No new lymphadenopathy. 5. No suspicious pulmonary nodules on today's examination. 6. Additional incidental findings, as above, similar prior studies.  [facility] in [facility] [place] Grade 2 versus was found in the low cervical esophagus, they will 5 mm in largest diameter.  Mild portal hypertension gets dropsy was found in the gastric fundus One oozing cratered gastric ulcer was seen at the incisura and gastric antrum, 7X78m 2 nonbleeding superficial duodenal ulcers with no stigmata of bleeding was found in the duodenal bowel  ASSESSMENT & PLAN:  70year old [place], with past history of hypertension and dilated nonischemic gammopathy with EF 25%, no clinical signs of heart failure, who was found to have hepatitis B infection lately, and multiple liver lesions on the CT scan. He has extremely high CEA and CA 19-9 levels. PET scan reviewed a hypermetabolic sigmoid colon mass, diffuse liver metastasis, probable lung and adrenal gland metastasis.  1. Metastatic sigmoid colon cancer, with diffuse liver, lungs, node and left adrenal gland metastases. KRAS/NRAS wild type, MSI-stable -Liver biopsy showed metastatic adenocarcinoma. His tumor were strongly positive for CK20 and CD2, consistent with primary colorectal primary. KRAS and NRAS mutations were not detected.  -Pt understands that this is  incurable cancer, and he has very high disease burden and overall prognosis is poor. The treatment goal is palliative -He had excellent response to first-line FOLFOX, but treatment was complicated with GI bleeding and neutropenic fever, resolved now. -he is currently on second line chemotherapy with irritecan and panitumumab, tolerating well  -His CEA has come down again, he is likely responding to treatment well -He has recovered well from his  recent influenza A infection, we'll resume treatment. Lab results reviewed with him. -We'll repeat restaging CT chest, abdomen and pelvis before next visit.  2. Grade 1-2 skin rashes, improved  -Secondary to panitumumab, improved   -continue hydrocortisone 2.5%, and clindamycin gel 1%  twice daily as needed  -He knows to avoid sun exposure, and call me if it gets worse.  3. Gastric ulcer with significant GI bleeding in April and Aug 2016 -He is on PPI, continue once daily  -Repeat his EGD on 08/15/2015 showed near complete healing of his gastric ulcer -continue Nadolol 42m bid, per Dr. SFuller Plan 4. Poorly controlled diabetes -HbA1c was 8.5, blood glucose not well controlled  -I encourage him to follow-up with his primary care physician Dr. WJimmye Norman 5. HTN, Dilated nonischemic ischemia cardiomyopathy with EF 25% -He is clinically doing well without symptoms of CHF. However this is probably going to impact his chemotherapy.Will try to avoid cardiotoxic chemotherapy agent and avoid fluid overload during chemotherapy. -Continue follow-up with cardiology.  6 Hepatitis B carrier, with mild portal hypertension  -Per liver clinic, no need for treatment. Follow-up with liver clinic.  7. Malnutrition -I encouraged him to eat more, and take supplements as needed. -follow up with Dietitian   8. Anemia secondary to GI bleeding, iron deficiency and chemo  -Repeat lab on 06/06/2015 showed ferritin 117, serum iron 24, saturation 8%, which supports iron  deficiency -He received IV Feraheme again in Aug 2016 after GI bleeding  -Repeat a ferritin was 273 on 10/03/2015, much improved  -anemia got worse lately, no clinical signs of GI bleeding. He received blood transfusion about 2 weeks ago. -I repeated his on study today, which showed low serum iron and saturation, will arrange iv feraheme   11. hypomagnesemia  -secondary to panitumumab -he receives IV mag 6 g with each infusion -He is taking magnesium pill 2 tablets 3 times a day, tolerating well no diarrhea. -follow up closely.   12. Influenza A infection in January 2017 -He received Tamiflu. Recovered well now.  Plan -resume irinotecan today and panitumumab  -6g iv mag today and weekly  -I reviewed multiple medications for him today. -iv feraheme weekly X2  -RTC for chemo in 2 weeks, with restaging CT    All questions were answered. The patient knows to call the clinic with any problems, questions or concerns.  I spent 25 minutes counseling the patient face to face. The total time spent in the appointment was 30 minutes and more than 50% was on counseling.     FTruitt Merle MD 11/20/2015   9:45 AM

## 2015-11-20 NOTE — Progress Notes (Signed)
Approximately 30 ml of magnesium left in patients bag.  Patient did not want to wait any longer, throughout the infusion he was very restless and kept wanting to leave.  Per [name] patient was ok to leave without finishing infusion.

## 2015-11-20 NOTE — Telephone Encounter (Signed)
Per staff message and POF I have scheduled appts. Advised scheduler of appts. JMW  

## 2015-11-20 NOTE — Patient Instructions (Addendum)
[facility] Discharge Instructions for Patients Receiving Chemotherapy  Today you received the following chemotherapy agents IRINOTECAN To help prevent nausea and vomiting after your treatment, we encourage you to take your nausea medication AS PRESCRIBED.  If you develop nausea and vomiting that is not controlled by your nausea medication, call the clinic.   BELOW ARE SYMPTOMS THAT SHOULD BE REPORTED IMMEDIATELY:  *FEVER GREATER THAN 100.5 F  *CHILLS WITH OR WITHOUT FEVER  NAUSEA AND VOMITING THAT IS NOT CONTROLLED WITH YOUR NAUSEA MEDICATION  *UNUSUAL SHORTNESS OF BREATH  *UNUSUAL BRUISING OR BLEEDING  TENDERNESS IN MOUTH AND THROAT WITH OR WITHOUT PRESENCE OF ULCERS  *URINARY PROBLEMS  *BOWEL PROBLEMS  UNUSUAL RASH Items with * indicate a potential emergency and should be followed up as soon as possible.  Feel free to call the clinic you have any questions or concerns. The clinic phone number is [phone].  Please show the [place] at check-in to the Emergency Department and triage nurse.  Panitumumab Solution for Injection What is this medicine? PANITUMUMAB (pan i TOOM ue mab) is a monoclonal antibody. It is used to treat colorectal cancer. This medicine may be used for other purposes; ask your health care provider or pharmacist if you have questions. What should I tell my health care provider before I take this medicine? They need to know if you have any of these conditions: -lung disease, especially lung fibrosis -skin conditions or sensitivity -an unusual or allergic reaction to panitumumab, mouse proteins, other medicines, foods, dyes, or preservatives -pregnant or trying to get pregnant -breast-feeding How should I use this medicine? This drug is given as an infusion into a vein. It is administered in a hospital or clinic by a specially trained health care professional. Talk to your pediatrician regarding the use of this medicine in  children. Special care may be needed. Overdosage: If you think you have taken too much of this medicine contact a poison control center or emergency room at once. NOTE: This medicine is only for you. Do not share this medicine with others. What if I miss a dose? It is important not to miss your dose. Call your doctor or health care professional if you are unable to keep an appointment. What may interact with this medicine? -some medicines for cancer This list may not describe all possible interactions. Give your health care provider a list of all the medicines, herbs, non-prescription drugs, or dietary supplements you use. Also tell them if you smoke, drink alcohol, or use illegal drugs. Some items may interact with your medicine. What should I watch for while using this medicine? Visit your doctor for checks on your progress. This drug may make you feel generally unwell. This is not uncommon, as chemotherapy can affect healthy cells as well as cancer cells. Report any side effects. Continue your course of treatment even though you feel ill unless your doctor tells you to stop. This medicine can make you more sensitive to the sun. Keep out of the sun while receiving this medicine and for 2 months after the last dose. If you cannot avoid being in the sun, wear protective clothing and use sunscreen. Do not use sun lamps or tanning beds/booths. In some cases, you may be given additional medicines to help with side effects. Follow all directions for their use. Call your doctor or health care professional for advice if you get a fever, chills or sore throat, or other symptoms of a cold or flu. Do  not treat yourself. This drug decreases your body's ability to fight infections. Try to avoid being around people who are sick. Avoid taking products that contain aspirin, acetaminophen, ibuprofen, naproxen, or ketoprofen unless instructed by your doctor. These medicines may hide a fever. Do not become pregnant  while taking this medicine and for 6 months after the last dose. Women should inform their doctor if they wish to become pregnant or think they might be pregnant. Men should not father a child while taking this medicine and for 6 months after the last dose. There is a potential for serious side effects to an unborn child. Talk to your health care professional or pharmacist for more information. Do not breast-feed an infant while taking this medicine. What side effects may I notice from receiving this medicine? Side effects that you should report to your doctor or health care professional as soon as possible: -allergic reactions like skin rash, itching or hives, swelling of the face, lips, or tongue -breathing problems -changes in vision -fast, irregular heartbeat -feeling faint or lightheaded, falls -fever, chills -mouth sores -swelling of the ankles, feet, hands -unusually weak or tired Side effects that usually do not require medical attention (report to your doctor or health care professional if they continue or are bothersome): -changes in skin like acne, cracks, skin dryness -constipation -diarrhea -eyelash growth -headache -nail changes -nausea, vomiting -stomach upset This list may not describe all possible side effects. Call your doctor for medical advice about side effects. You may report side effects to FDA at 1-800-FDA-1088. Where should I keep my medicine? This drug is given in a hospital or clinic and will not be stored at home. NOTE: This sheet is a summary. It may not cover all possible information. If you have questions about this medicine, talk to your doctor, pharmacist, or health care provider.    2016, Elsevier/Gold Standard. (2014-12-13 17:21:33)

## 2015-11-21 NOTE — Progress Notes (Addendum)
Explained to pt and son and daughter that magnesium is low and iv mag added to appt today -an additional 3 hours  for magnesium. Son left and aware to pick pt up at 5 pm -pt also aware via interpretation by son , Douglass Rivers.  Pt became very insistent that i disconnect his iv so he can go home at 3 pm. He denied diarrhea , pain or any problems - he just wants to leave.  Douglass Rivers came back to talk to pt and pt calmed down.

## 2015-11-27 ENCOUNTER — Ambulatory Visit (HOSPITAL_BASED_OUTPATIENT_CLINIC_OR_DEPARTMENT_OTHER): Payer: PPO

## 2015-11-27 ENCOUNTER — Other Ambulatory Visit: Payer: Self-pay | Admitting: Hematology

## 2015-11-27 VITALS — BP 107/65 | HR 60 | Temp 97.9°F | Resp 18

## 2015-11-27 DIAGNOSIS — C187 Malignant neoplasm of sigmoid colon: Secondary | ICD-10-CM | POA: Diagnosis not present

## 2015-11-27 DIAGNOSIS — D500 Iron deficiency anemia secondary to blood loss (chronic): Secondary | ICD-10-CM

## 2015-11-27 DIAGNOSIS — D509 Iron deficiency anemia, unspecified: Secondary | ICD-10-CM

## 2015-11-27 LAB — CBC WITH DIFFERENTIAL/PLATELET
BASO%: 0.8 % (ref 0.0–2.0)
Basophils Absolute: 0 10*3/uL (ref 0.0–0.1)
EOS%: 1.6 % (ref 0.0–7.0)
Eosinophils Absolute: 0.1 10*3/uL (ref 0.0–0.5)
HEMATOCRIT: 28.4 % — AB (ref 38.4–49.9)
HGB: 9.3 g/dL — ABNORMAL LOW (ref 13.0–17.1)
LYMPH#: 1.4 10*3/uL (ref 0.9–3.3)
LYMPH%: 32.6 % (ref 14.0–49.0)
MCH: 29.2 pg (ref 27.2–33.4)
MCHC: 32.6 g/dL (ref 32.0–36.0)
MCV: 89.4 fL (ref 79.3–98.0)
MONO#: 0.1 10*3/uL (ref 0.1–0.9)
MONO%: 3.4 % (ref 0.0–14.0)
NEUT#: 2.7 10*3/uL (ref 1.5–6.5)
NEUT%: 61.6 % (ref 39.0–75.0)
Platelets: 198 10*3/uL (ref 140–400)
RBC: 3.18 10*6/uL — ABNORMAL LOW (ref 4.20–5.82)
RDW: 17.3 % — ABNORMAL HIGH (ref 11.0–14.6)
WBC: 4.3 10*3/uL (ref 4.0–10.3)

## 2015-11-27 LAB — BASIC METABOLIC PANEL
ANION GAP: 10 meq/L (ref 3–11)
BUN: 19.8 mg/dL (ref 7.0–26.0)
CALCIUM: 9.3 mg/dL (ref 8.4–10.4)
CO2: 23 meq/L (ref 22–29)
Chloride: 105 mEq/L (ref 98–109)
Creatinine: 1 mg/dL (ref 0.7–1.3)
EGFR: 74 mL/min/{1.73_m2} — ABNORMAL LOW (ref >90)
GLUCOSE: 153 mg/dL — AB (ref 70–140)
Potassium: 4 mEq/L (ref 3.5–5.1)
Sodium: 139 mEq/L (ref 136–145)

## 2015-11-27 LAB — MAGNESIUM: Magnesium: 1.7 mg/dl (ref 1.5–2.5)

## 2015-11-27 MED ORDER — SODIUM CHLORIDE 0.9 % IJ SOLN
3.0000 mL | Freq: Once | INTRAMUSCULAR | Status: AC | PRN
  Administered 2015-11-27: 3 mL via INTRAVENOUS
  Filled 2015-11-27: qty 10

## 2015-11-27 MED ORDER — SODIUM CHLORIDE 0.9 % IV SOLN
Freq: Once | INTRAVENOUS | Status: AC
  Administered 2015-11-27: 13:00:00 via INTRAVENOUS
  Filled 2015-11-27: qty 250

## 2015-11-27 MED ORDER — SODIUM CHLORIDE 0.9 % IV SOLN: 4.0000 g | Freq: Once | INTRAVENOUS | Status: DC

## 2015-11-27 MED ORDER — SODIUM CHLORIDE 0.9 % IV SOLN
510.0000 mg | Freq: Once | INTRAVENOUS | Status: AC
  Administered 2015-11-27: 510 mg via INTRAVENOUS
  Filled 2015-11-27: qty 17

## 2015-11-27 MED ORDER — MAGNESIUM SULFATE 50 % IJ SOLN
4.0000 g | Freq: Once | INTRAMUSCULAR | Status: DC
  Filled 2015-11-27: qty 8

## 2015-11-27 MED ORDER — HEPARIN SOD (PORK) LOCK FLUSH 100 UNIT/ML IV SOLN
250.0000 [IU] | Freq: Once | INTRAVENOUS | Status: AC | PRN
  Administered 2015-11-27: 250 [IU]
  Filled 2015-11-27: qty 5

## 2015-11-27 NOTE — Patient Instructions (Signed)
Iron Deficiency Anemia, Adult Anemia is a condition in which there are less red blood cells or hemoglobin in the blood than normal. Hemoglobin is the part of red blood cells that carries oxygen. Iron deficiency anemia is anemia caused by too little iron. It is the most common type of anemia. It may leave you tired and short of breath. CAUSES  Lack of iron in the diet. Poor absorption of iron, as seen with intestinal disorders. Intestinal bleeding. Heavy periods. SIGNS AND SYMPTOMS  Mild anemia may not be noticeable. Symptoms may include: Fatigue. Headache. Pale skin. Weakness. Tiredness. Shortness of breath. Dizziness. Cold hands and feet. Fast or irregular heartbeat. DIAGNOSIS  Diagnosis requires a thorough evaluation and physical exam by your health care provider. Blood tests are generally used to confirm iron deficiency anemia. Additional tests may be done to find the underlying cause of your anemia. These may include: Testing for blood in the stool (fecal occult blood test). A procedure to see inside the colon and rectum (colonoscopy). A procedure to see inside the esophagus and stomach (endoscopy). TREATMENT  Iron deficiency anemia is treated by correcting the cause of the deficiency. Treatment may involve: Adding iron-rich foods to your diet. Taking iron supplements. Pregnant or breastfeeding women need to take extra iron because their normal diet usually does not provide the required amount. Taking vitamins. Vitamin C improves the absorption of iron. Your health care provider may recommend that you take your iron tablets with a glass of orange juice or vitamin C supplement. Medicines to make heavy menstrual flow lighter. Surgery. HOME CARE INSTRUCTIONS  Take iron as directed by your health care provider. If you cannot tolerate taking iron supplements by mouth, talk to your health care provider about taking them through a vein (intravenously) or an injection into a muscle. For  the best iron absorption, iron supplements should be taken on an empty stomach. If you cannot tolerate them on an empty stomach, you may need to take them with food. Do not drink milk or take antacids at the same time as your iron supplements. Milk and antacids may interfere with the absorption of iron. Iron supplements can cause constipation. Make sure to include fiber in your diet to prevent constipation. A stool softener may also be recommended. Take vitamins as directed by your health care provider. Eat a diet rich in iron. Foods high in iron include liver, lean beef, whole-grain bread, eggs, dried fruit, and dark green leafy vegetables. SEEK IMMEDIATE MEDICAL CARE IF:  You faint. If this happens, do not drive. Call your local emergency services (911 in [place] if no other help is available. You have chest pain. You feel nauseous or vomit. You have severe or increased shortness of breath with activity. You feel weak. You have a rapid heartbeat. You have unexplained sweating. You become light-headed when getting up from a chair or bed. MAKE SURE YOU:  Understand these instructions. Will watch your condition. Will get help right away if you are not doing well or get worse.   This information is not intended to replace advice given to you by your health care provider. Make sure you discuss any questions you have with your health care provider.   Document Released: 10/11/2000 Document Revised: 11/04/2014 Document Reviewed: 06/21/2013 Elsevier Interactive Patient Education 2016 [employer]. Hypomagnesemia Hypomagnesemia is a condition in which the level of magnesium in the blood is low. Magnesium is a mineral that is found in many foods. It is used in many different processes  in the body. Hypomagnesemia can affect every organ in the body. It can cause life-threatening problems. CAUSES Causes of hypomagnesemia include:  Not getting enough magnesium in your diet.  Malnutrition.  Problems  with absorbing magnesium from the intestines.  Dehydration.  Alcohol abuse.  Vomiting.  Severe diarrhea.  Some medicines, including medicines that make you urinate more.  Certain diseases, such as kidney disease, diabetes, and overactive thyroid. SIGNS AND SYMPTOMS  Involuntary shaking or trembling of a body part (tremor).  Confusion.  Muscle weakness.  Sensitivity to light, sound, and touch.  Psychiatric issues, such as depression, irritability, or psychosis.  Sudden tightening of muscles (muscle spasms).  Tingling in the arms and legs.  A feeling of fluttering of the heart. These symptoms are more severe if magnesium levels drop suddenly. DIAGNOSIS To make a diagnosis, your health care provider will do a physical exam and order blood and urine tests. TREATMENT Treatment will depend on the cause and the severity of your condition. It may involve:  A magnesium supplement. This can be taken in pill form. It can also be given through an IV tube. This is usually done if the condition is severe.  Changes to your diet. You may be directed to eat foods that have a lot of magnesium, such as green leafy vegetables, peas, beans, and nuts.  Eliminating alcohol from your diet. HOME CARE INSTRUCTIONS  Include foods with magnesium in your diet. Foods that are rich in magnesium include green vegetables, beans, nuts and seeds, and whole grains.  Take medicines only as directed by your health care provider.  Take magnesium supplements if your health care provider instructs you to do that. Take them as directed.  Have your magnesium levels monitored as directed by your health care provider.  When you are active, drink fluids that contain electrolytes.  Keep all follow-up visits as directed by your health care provider. This is important. SEEK MEDICAL CARE IF:  You get worse instead of better.  Your symptoms return. SEEK IMMEDIATE MEDICAL CARE IF:  Your symptoms are  severe.   This information is not intended to replace advice given to you by your health care provider. Make sure you discuss any questions you have with your health care provider.   Document Released: 07/10/2005 Document Revised: 11/04/2014 Document Reviewed: 05/30/2014 Elsevier Interactive Patient Education [employer].

## 2015-11-30 ENCOUNTER — Ambulatory Visit (HOSPITAL_COMMUNITY)
Admission: RE | Admit: 2015-11-30 | Discharge: 2015-11-30 | Disposition: A | Discharge status: Home / Self Care | Payer: PPO | Source: Ambulatory Visit | Attending: Hematology | Admitting: Hematology

## 2015-11-30 ENCOUNTER — Encounter (HOSPITAL_COMMUNITY): Payer: Self-pay

## 2015-11-30 DIAGNOSIS — C187 Malignant neoplasm of sigmoid colon: Secondary | ICD-10-CM

## 2015-11-30 DIAGNOSIS — C772 Secondary and unspecified malignant neoplasm of intra-abdominal lymph nodes: Secondary | ICD-10-CM | POA: Diagnosis not present

## 2015-11-30 DIAGNOSIS — C7971 Secondary malignant neoplasm of right adrenal gland: Secondary | ICD-10-CM | POA: Insufficient documentation

## 2015-11-30 DIAGNOSIS — C787 Secondary malignant neoplasm of liver and intrahepatic bile duct: Secondary | ICD-10-CM | POA: Insufficient documentation

## 2015-11-30 DIAGNOSIS — C775 Secondary and unspecified malignant neoplasm of intrapelvic lymph nodes: Secondary | ICD-10-CM | POA: Diagnosis not present

## 2015-11-30 MED ORDER — IOHEXOL 300 MG/ML  SOLN
100.0000 mL | Freq: Once | INTRAMUSCULAR | Status: AC | PRN
Start: 2015-11-30 — End: 2015-11-30
  Administered 2015-11-30: 100 mL via INTRAVENOUS

## 2015-12-04 ENCOUNTER — Telehealth: Payer: Self-pay | Admitting: *Deleted

## 2015-12-04 ENCOUNTER — Encounter: Payer: Self-pay | Admitting: Hematology

## 2015-12-04 ENCOUNTER — Telehealth: Payer: Self-pay | Admitting: Hematology

## 2015-12-04 ENCOUNTER — Other Ambulatory Visit (HOSPITAL_BASED_OUTPATIENT_CLINIC_OR_DEPARTMENT_OTHER): Payer: PPO

## 2015-12-04 ENCOUNTER — Ambulatory Visit (HOSPITAL_BASED_OUTPATIENT_CLINIC_OR_DEPARTMENT_OTHER): Payer: PPO

## 2015-12-04 ENCOUNTER — Ambulatory Visit: Payer: PPO

## 2015-12-04 ENCOUNTER — Ambulatory Visit (HOSPITAL_BASED_OUTPATIENT_CLINIC_OR_DEPARTMENT_OTHER): Payer: PPO | Admitting: Hematology

## 2015-12-04 ENCOUNTER — Other Ambulatory Visit: Payer: Self-pay | Admitting: *Deleted

## 2015-12-04 VITALS — BP 94/56 | HR 69 | Temp 98.8°F | Resp 18 | Ht 65.0 in | Wt 137.7 lb

## 2015-12-04 DIAGNOSIS — D500 Iron deficiency anemia secondary to blood loss (chronic): Secondary | ICD-10-CM

## 2015-12-04 DIAGNOSIS — C7972 Secondary malignant neoplasm of left adrenal gland: Secondary | ICD-10-CM | POA: Diagnosis not present

## 2015-12-04 DIAGNOSIS — I5042 Chronic combined systolic (congestive) and diastolic (congestive) heart failure: Secondary | ICD-10-CM

## 2015-12-04 DIAGNOSIS — K254 Chronic or unspecified gastric ulcer with hemorrhage: Secondary | ICD-10-CM

## 2015-12-04 DIAGNOSIS — I10 Essential (primary) hypertension: Secondary | ICD-10-CM

## 2015-12-04 DIAGNOSIS — D509 Iron deficiency anemia, unspecified: Secondary | ICD-10-CM

## 2015-12-04 DIAGNOSIS — C772 Secondary and unspecified malignant neoplasm of intra-abdominal lymph nodes: Secondary | ICD-10-CM

## 2015-12-04 DIAGNOSIS — C7800 Secondary malignant neoplasm of unspecified lung: Secondary | ICD-10-CM

## 2015-12-04 DIAGNOSIS — E119 Type 2 diabetes mellitus without complications: Secondary | ICD-10-CM

## 2015-12-04 DIAGNOSIS — Z95828 Presence of other vascular implants and grafts: Secondary | ICD-10-CM

## 2015-12-04 DIAGNOSIS — C787 Secondary malignant neoplasm of liver and intrahepatic bile duct: Secondary | ICD-10-CM | POA: Diagnosis not present

## 2015-12-04 DIAGNOSIS — C187 Malignant neoplasm of sigmoid colon: Secondary | ICD-10-CM

## 2015-12-04 LAB — CBC WITH DIFFERENTIAL/PLATELET
BASO%: 0.4 % (ref 0.0–2.0)
Basophils Absolute: 0 10*3/uL (ref 0.0–0.1)
EOS%: 5.7 % (ref 0.0–7.0)
Eosinophils Absolute: 0.2 10*3/uL (ref 0.0–0.5)
HCT: 26 % — ABNORMAL LOW (ref 38.4–49.9)
HEMOGLOBIN: 8.5 g/dL — AB (ref 13.0–17.1)
LYMPH%: 27 % (ref 14.0–49.0)
MCH: 29.6 pg (ref 27.2–33.4)
MCHC: 32.8 g/dL (ref 32.0–36.0)
MCV: 90.2 fL (ref 79.3–98.0)
MONO#: 1 10*3/uL — ABNORMAL HIGH (ref 0.1–0.9)
MONO%: 23.9 % — AB (ref 0.0–14.0)
NEUT%: 43 % (ref 39.0–75.0)
NEUTROS ABS: 1.8 10*3/uL (ref 1.5–6.5)
Platelets: 179 10*3/uL (ref 140–400)
RBC: 2.88 10*6/uL — ABNORMAL LOW (ref 4.20–5.82)
RDW: 17.6 % — AB (ref 11.0–14.6)
WBC: 4.1 10*3/uL (ref 4.0–10.3)
lymph#: 1.1 10*3/uL (ref 0.9–3.3)

## 2015-12-04 LAB — COMPREHENSIVE METABOLIC PANEL
ALK PHOS: 224 U/L — AB (ref 40–150)
ALT: 26 U/L (ref 0–55)
ANION GAP: 9 meq/L (ref 3–11)
AST: 33 U/L (ref 5–34)
Albumin: 3 g/dL — ABNORMAL LOW (ref 3.5–5.0)
BILIRUBIN TOTAL: 0.55 mg/dL (ref 0.20–1.20)
BUN: 16.9 mg/dL (ref 7.0–26.0)
CO2: 22 mEq/L (ref 22–29)
CREATININE: 1.2 mg/dL (ref 0.7–1.3)
Calcium: 8.2 mg/dL — ABNORMAL LOW (ref 8.4–10.4)
Chloride: 103 mEq/L (ref 98–109)
EGFR: 65 mL/min/{1.73_m2} — AB (ref >90)
Glucose: 170 mg/dl — ABNORMAL HIGH (ref 70–140)
Potassium: 4.3 mEq/L (ref 3.5–5.1)
SODIUM: 135 meq/L — AB (ref 136–145)
Total Protein: 6.9 g/dL (ref 6.4–8.3)

## 2015-12-04 LAB — MAGNESIUM: MAGNESIUM: 1 mg/dL — AB (ref 1.5–2.5)

## 2015-12-04 LAB — FERRITIN: FERRITIN: 966 ng/mL — AB (ref 22–316)

## 2015-12-04 MED ORDER — SODIUM CHLORIDE 0.9 % IV SOLN
Freq: Once | INTRAVENOUS | Status: AC
  Administered 2015-12-04: 12:00:00 via INTRAVENOUS

## 2015-12-04 MED ORDER — HEPARIN SOD (PORK) LOCK FLUSH 100 UNIT/ML IV SOLN
500.0000 [IU] | Freq: Once | INTRAVENOUS | Status: AC
  Administered 2015-12-04: 500 [IU] via INTRAVENOUS
  Filled 2015-12-04: qty 5

## 2015-12-04 MED ORDER — FERUMOXYTOL INJECTION 510 MG/17 ML
510.0000 mg | Freq: Once | INTRAVENOUS | Status: AC
  Administered 2015-12-04: 510 mg via INTRAVENOUS
  Filled 2015-12-04: qty 17

## 2015-12-04 MED ORDER — SODIUM CHLORIDE 0.9% FLUSH
10.0000 mL | INTRAVENOUS | Status: DC | PRN
  Administered 2015-12-04: 10 mL via INTRAVENOUS
  Filled 2015-12-04: qty 10

## 2015-12-04 MED ORDER — SODIUM CHLORIDE 0.9 % IV SOLN
6.0000 g | Freq: Once | INTRAVENOUS | Status: AC
  Administered 2015-12-04: 6 g via INTRAVENOUS
  Filled 2015-12-04: qty 12

## 2015-12-04 NOTE — Addendum Note (Signed)
Addended by: Truitt Merle on: 12/04/2015 11:38 AM   Modules accepted: Orders

## 2015-12-04 NOTE — Patient Instructions (Signed)
Hypomagnesemia Hypomagnesemia is a condition in which the level of magnesium in the blood is low. Magnesium is a mineral that is found in many foods. It is used in many different processes in the body. Hypomagnesemia can affect every organ in the body. It can cause life-threatening problems. CAUSES Causes of hypomagnesemia include:  Not getting enough magnesium in your diet.  Malnutrition.  Problems with absorbing magnesium from the intestines.  Dehydration.  Alcohol abuse.  Vomiting.  Severe diarrhea.  Some medicines, including medicines that make you urinate more.  Certain diseases, such as kidney disease, diabetes, and overactive thyroid. SIGNS AND SYMPTOMS  Involuntary shaking or trembling of a body part (tremor).  Confusion.  Muscle weakness.  Sensitivity to light, sound, and touch.  Psychiatric issues, such as depression, irritability, or psychosis.  Sudden tightening of muscles (muscle spasms).  Tingling in the arms and legs.  A feeling of fluttering of the heart. These symptoms are more severe if magnesium levels drop suddenly. DIAGNOSIS To make a diagnosis, your health care provider will do a physical exam and order blood and urine tests. TREATMENT Treatment will depend on the cause and the severity of your condition. It may involve:  A magnesium supplement. This can be taken in pill form. It can also be given through an IV tube. This is usually done if the condition is severe.  Changes to your diet. You may be directed to eat foods that have a lot of magnesium, such as green leafy vegetables, peas, beans, and nuts.  Eliminating alcohol from your diet. HOME CARE INSTRUCTIONS  Include foods with magnesium in your diet. Foods that are rich in magnesium include green vegetables, beans, nuts and seeds, and whole grains.  Take medicines only as directed by your health care provider.  Take magnesium supplements if your health care provider instructs you to  do that. Take them as directed.  Have your magnesium levels monitored as directed by your health care provider.  When you are active, drink fluids that contain electrolytes.  Keep all follow-up visits as directed by your health care provider. This is important. SEEK MEDICAL CARE IF:  You get worse instead of better.  Your symptoms return. SEEK IMMEDIATE MEDICAL CARE IF:  Your symptoms are severe.   This information is not intended to replace advice given to you by your health care provider. Make sure you discuss any questions you have with your health care provider.   Document Released: 07/10/2005 Document Revised: 11/04/2014 Document Reviewed: 05/30/2014 Elsevier Interactive Patient Education 2016 [place] injection What is this medicine? FERUMOXYTOL is an iron complex. Iron is used to make healthy red blood cells, which carry oxygen and nutrients throughout the body. This medicine is used to treat iron deficiency anemia in people with chronic kidney disease. This medicine may be used for other purposes; ask your health care provider or pharmacist if you have questions. What should I tell my health care provider before I take this medicine? They need to know if you have any of these conditions: -anemia not caused by low iron levels -high levels of iron in the blood -magnetic resonance imaging (MRI) test scheduled -an unusual or allergic reaction to iron, other medicines, foods, dyes, or preservatives -pregnant or trying to get pregnant -breast-feeding How should I use this medicine? This medicine is for injection into a vein. It is given by a health care professional in a hospital or clinic setting. Talk to your pediatrician regarding the use  of this medicine in children. Special care may be needed. Overdosage: If you think you have taken too much of this medicine contact a poison control center or emergency room at once. NOTE: This medicine is only for  you. Do not share this medicine with others. What if I miss a dose? It is important not to miss your dose. Call your doctor or health care professional if you are unable to keep an appointment. What may interact with this medicine? This medicine may interact with the following medications: -other iron products This list may not describe all possible interactions. Give your health care provider a list of all the medicines, herbs, non-prescription drugs, or dietary supplements you use. Also tell them if you smoke, drink alcohol, or use illegal drugs. Some items may interact with your medicine. What should I watch for while using this medicine? Visit your doctor or healthcare professional regularly. Tell your doctor or healthcare professional if your symptoms do not start to get better or if they get worse. You may need blood work done while you are taking this medicine. You may need to follow a special diet. Talk to your doctor. Foods that contain iron include: whole grains/cereals, dried fruits, beans, or peas, leafy green vegetables, and organ meats (liver, kidney). What side effects may I notice from receiving this medicine? Side effects that you should report to your doctor or health care professional as soon as possible: -allergic reactions like skin rash, itching or hives, swelling of the face, lips, or tongue -breathing problems -changes in blood pressure -feeling faint or lightheaded, falls -fever or chills -flushing, sweating, or hot feelings -swelling of the ankles or feet Side effects that usually do not require medical attention (Report these to your doctor or health care professional if they continue or are bothersome.): -diarrhea -headache -nausea, vomiting -stomach pain This list may not describe all possible side effects. Call your doctor for medical advice about side effects. You may report side effects to FDA at 1-800-FDA-1088. Where should I keep my medicine? This drug is  given in a hospital or clinic and will not be stored at home. NOTE: This sheet is a summary. It may not cover all possible information. If you have questions about this medicine, talk to your doctor, pharmacist, or health care provider.    2016, Elsevier/[employer]. (2012-05-29 15:23:36)

## 2015-12-04 NOTE — Telephone Encounter (Signed)
per po to sch pt IV-MW sch pt to get updated copy b4 leaving trmt room

## 2015-12-04 NOTE — Telephone Encounter (Signed)
per pof to sch pt appt-gave pt copy of avs °

## 2015-12-04 NOTE — Telephone Encounter (Signed)
Per staff message and POF I have scheduled appts. Advised scheduler of appts. JMW  

## 2015-12-04 NOTE — Progress Notes (Addendum)
[place]  [phone]) [phone] [phone]  Clinic follow up Note   Patient Care Team: Jason Junior, MD as PCP - General (Specialist) Jason Reilly, CRNP as Nurse Practitioner (Nurse Practitioner) Jason Merle, MD as Consulting Physician (Hematology) Jason Artist, MD as Consulting Physician (Gastroenterology) 12/04/2015  CHIEF COMPLAINTS Follow up metastatic sigmoid colon cancer  Oncology History   He is a Metastatic colon cancer to liver   Staging form: Colon and Rectum, AJCC 7th Edition     Clinical: Stage Unknown ([place], NX, M1) - Unsigned      Cancer of sigmoid colon ([place])   10/28/2014 Tumor Marker AFP 3.2 CEA > 10,000 CA 19-9 12,929.6. tumor (-) KRAS and NRAS mutation.    11/10/2014 Imaging PET scan showed hypermetabolic mass in the sigmoid colon was noted metastasis in the retroperitoneum. Probable left adrenal and pulmonary metastasis, and diffuse liver metastasis.   11/21/2014 Initial Diagnosis Metastatic colon cancer to liver, lung, abd nodes and left adrenal gland. Diagnosis was made by liver biopsy.    11/30/2014 -  Chemotherapy First line chemo [id number], Panitumumab added from second cycle    12/28/2014 - 12/31/2014 Hospital Admission Was admitted for dehydration, neutropenia fever with UTI, and severe skin rash.   02/08/2015 - 02/12/2015 Hospital Admission He was admitted for upper GI bleeding, e.g. showed a gastric ulcer with clots, status post appendectomy injection. He also received a blood transfusion.   03/01/2015 Tumor Marker CEA 694, CA19.9 462   03/13/2015 Imaging CT CAP showed partial response, no new lesions.    03/15/2015 - 05/23/2015 Chemotherapy restart FOLFOX, held again on 8/9 due to GI bleeding    05/31/2015 - 06/02/2015 Hospital Admission He was admitted to [facility] in [place] due to upper GI bleeding, EGD showed gastric and dudenal ulcers    06/27/2015 - 06/29/2015 Hospital Admission he was admitted for dairrhea and pancolitis, c-diff and  stool cultures were negative, treated with antibiotics   07/12/2015 -  Chemotherapy panitumumab 52m/kg, every 2 weeks   11/08/2015 - 11/10/2015 Hospital Admission Pt was admitted for sepsis and hypotension, was found to have (+) influenza A and treated.    11/30/2015 Imaging  CT scans reviewed continued improvement in the liver metastasis, no other new lesions.     HISTORY OF PRESENTING ILLNESS:  [name] 70y.o. male is here because of abnormal CT findings, which is very suspicious for malignancy. [place] He came in with his son and an interpreter.  He has been feeling fatigued since two month ago. He is still able to do all ADLs. He otherwise denies any pain, bloating or nausea.  He lost about 20lbs in 3 month. His appetite is lower than before, eats less, no change of his bowl habits.  She denied any hematochezia or melana. Per his son, he has had some personality changes daily, irritable, slightly confused some time.  He was evaluated by his primary care physician. Lab test reviewed hepatitis B infection, which he did not know before, and elevated alkaline phosphatase, his liver function and the rest of the liver function was not remarkable. [place] was obtained on 07/22/2014, which showed diffusely abnormal liver with multiple echogenic lesions. CT of abdomen with and without contrast was done on 08/26/2014, which reviewed here at a medically with multiple large partially calcified hepatic masses consistent with metastatic disease. Mild retroperitoneal adenopathy with the largest node measuring 1.6  cm. And nonspecific 1.4 cm left adrenal nodule was also noticed. His tumor marker showed CEA greater than 10,000, CA 19-9 12,929, AFP 3.2 (normal). He was referred to [facility] and was evaluated by nurse practitioner Jason Reilly. Treatment for hepatitis B was not recommended based on his virus load.  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 He was evaluated by a cardiologist in 2014. He denies any significant dyspnea on exertion. No leg swollen.  CURRENT THERAPY: panitumumab 5m/kg, every 2 weeks, started on 07/12/2015, Irinotecan 1833mm2 every 2 weeks added on 09/19/2015, dose decreased to 16066m2 from cycle 2   INTERIM HISTORY: Jason Reilly for follow-up and chemotherapy. He  Is accompanied by an interpreter today. He is doing well,  Denies any significant pain, nausea, diarrhea, or other symptoms. He tolerated that last cycle chemotherapy 2 weeks ago very well.  He states he has good energy level and appetite, his weight has been stable. No fever or chills.  MEDICAL HISTORY:  Past Medical History  Diagnosis Date  . Tachycardia   . Abnormal EKG   . Hypertension   . Non[place]ischemic cardiomyopathy ([place] . Hepatitis B   . H. pylori infection   . Gastric ulcer   . Metastatic colon cancer to liver ([place] . Diabetes mellitus without complication ([place]CC[place]   metformin    SURGICAL HISTORY: Past Surgical History  Procedure Laterality Date  . Nuclear stress test  03/03/2013    High risk - consistent with nonischemic cardiomyopathy  . Left and right heart catheterization with coronary angiogram N/A 03/29/2013    Procedure: LEFT AND RIGHT HEART CATHETERIZATION WITH CORONARY ANGIOGRAM;  Surgeon: KenPixie CasinoD;  [facility];  Service: Cardiovascular;  Laterality: N/A;  . Esophagogastroduodenoscopy N/A 02/08/2015    Procedure: ESOPHAGOGASTRODUODENOSCOPY (EGD);  Surgeon: MalLadene ArtistD;  [facility];  Service: Endoscopy;  Laterality: N/A;  . Esophagogastroduodenoscopy (egd) with propofol N/A 08/15/2015    Procedure: ESOPHAGOGASTRODUODENOSCOPY (EGD) WITH PROPOFOL;  Surgeon: MalLadene ArtistD;  [facility];  Service: Endoscopy;  Laterality: N/A;    SOCIAL HISTORY: Social History   Social History  . Marital Status:  Married    Spouse Name: N/A  . Number of Children: N/A  . Years of Education: N/A   Occupational History  . Not on file.   Social History Main Topics  . Smoking status: Former Smoker    Types: Cigarettes    Quit date: 11/07/2008  . Smokeless tobacco: Never Used  . Alcohol Use: Yes     Comment: occasional  . Drug Use: No  . Sexual Activity: No   Other Topics Concern  . Not on file   Social History Narrative   Married   Enjoys walking   [place] lived [place]    FAMILY HISTORY: No family history of liver disease or malignancy.  ALLERGIES:  has No Known Allergies.  MEDICATIONS:  Current Outpatient Prescriptions on File Prior to Visit  Medication Sig Dispense Refill  . clindamycin (CLINDAGEL) 1 % gel Apply topically 2 (two) times daily. 30 g 2  . clindamycin (CLINDAGEL) 1 % gel Apply topically 2 (two) times daily. 30 g 2  . folic acid (FOLVITE) 1 MG tablet Take 1 tablet (1 mg total) by mouth daily. 30 tablet 3  . lidocaine-prilocaine (EMLA) cream Apply 1 application topically as needed. [facility] at least one  hour before needle stick. 30 g 2  . loperamide (IMODIUM) 2 MG capsule Take 2 capsules (4 mg total) by mouth 4 (four) times daily as needed for diarrhea or loose stools (NO MORE THAN 8 TABLETS PER DAY.). 60 capsule 3  . magic mouthwash SOLN Take 5 mLs by mouth 4 (four) times daily. (Patient taking differently: Take 5 mLs by mouth 4 (four) times daily as needed. ) 120 mL 1  . magnesium oxide (MAG-OX) 400 (241.3 Mg) MG tablet Take 1 tablet (400 mg total) by mouth 3 (three) times daily. 60 tablet 1  . metFORMIN (GLUCOPHAGE) 500 MG tablet Take 1 tablet (500 mg total) by mouth 2 (two) times daily with a meal. THIS IS A ONE TIME ORDER.  FUTURE REFILLS NEED TO BE DONE BY PRIMARY MD. 90 tablet 0  . nadolol (CORGARD) 20 MG tablet Take 1 tablet (20 mg total) by mouth 2 (two) times daily. THIS IS A ONE TIME ORDER.  FUTURE REFILLS NEED TO BE DONE BY PRIMARY MD. 90 tablet  0  . ondansetron (ZOFRAN) 8 MG tablet Take 1 tablet (8 mg total) by mouth every 8 (eight) hours as needed for nausea or vomiting. 45 tablet 2  . oseltamivir (TAMIFLU) 75 MG capsule Take 1 capsule (75 mg total) by mouth 2 (two) times daily. 6 capsule 0  . pantoprazole (PROTONIX) 40 MG tablet Take 1 tablet (40 mg total) by mouth daily. 90 tablet 1  . prochlorperazine (COMPAZINE) 10 MG tablet Take 1 tablet (10 mg total) by mouth every 6 (six) hours as needed for nausea or vomiting. 30 tablet 1   Current Facility-Administered Medications on File Prior to Visit  Medication Dose Route Frequency Provider Last Rate Last Dose  . sodium chloride 0.9 % injection 10 mL  10 mL Intracatheter PRN Jason Merle, MD   10 mL at 10/03/15 1705  . sodium chloride flush (NS) 0.9 % injection 10 mL  10 mL Intravenous PRN Jason Merle, MD   10 mL at 12/04/15 1045  ;   REVIEW OF SYSTEMS:   Constitutional: Denies fevers, chills or abnormal night sweats, (+) fatigue  Eyes: Denies blurriness of vision, double vision or watery eyes Ears, nose, mouth, throat, and face: Denies mucositis or sore throat Respiratory: Denies cough, dyspnea or wheezes Cardiovascular: Denies palpitation, chest discomfort or lower extremity swelling Gastrointestinal: Denies nausea, heartburn or change in bowel habits Skin: Denies abnormal skin rashes Lymphatics: Denies new lymphadenopathy or easy bruising Neurological:Denies numbness, tingling or new weaknesses Behavioral/Psych: Mood is stable, no new changes, (+) insomnia All other systems were reviewed with the patient and are negative.  PHYSICAL EXAMINATION: BP 94/56 mmHg  Pulse 69  Temp(Src) 98.8 F (37.1 C) (Oral)  Resp 18  Ht 5' 5" (1.651 m)  Wt 137 lb 11.2 oz (62.46 kg)  BMI 22.91 kg/m2  SpO2 99%  ECOG PERFORMANCE STATUS: 1 Vital sign were taken at in the infusion room, within normal limits. GENERAL:alert, no distress and comfortable SKIN: (+) Dry skin,  no skin ulcer, rashes or  discharge. EYES: normal, conjunctiva are pink and non-injected, sclera clear OROPHARYNX:no exudate, no erythema and lips, buccal mucosa, and tongue with mild discoloration at the tip.  NECK: supple, thyroid normal size, non-tender, without nodularity LYMPH:  no palpable lymphadenopathy in the cervical, axillary or inguinal LUNGS: clear to auscultation and percussion with normal breathing effort, (+) crackles on b/l lung base  HEART: regular rate & rhythm and no murmurs and no lower extremity edema ABDOMEN:abdomen  soft, non-tender, no hepatomegaly, no splenomegaly and normal bowel sounds Musculoskeletal:no cyanosis of digits and no clubbing  PSYCH: alert & oriented x 3 with fluent speech NEURO: no focal motor/sensory deficits  LABORATORY DATA:  I have reviewed the data as listed CBC Latest Ref Rng 12/04/2015 11/27/2015 11/20/2015  WBC 4.0 - 10.3 10e3/uL 4.1 4.3 5.2  Hemoglobin 13.0 - 17.1 g/dL 8.5(L) 9.3(L) 9.3(L)  Hematocrit 38.4 - 49.9 % 26.0(L) 28.4(L) 28.0(L)  Platelets 140 - 400 10e3/uL 179 198 120(L)   ANC is 1.3  CMP Latest Ref Rng 11/27/2015 11/20/2015 11/10/2015  Glucose 70 - 140 mg/dl 153(H) 94 186(H)  BUN 7.0 - 26.0 mg/dL 19.8 18.0 17  Creatinine 0.7 - 1.3 mg/dL 1.0 1.1 0.89  Sodium 136 - 145 mEq/L 139 140 138  Potassium 3.5 - 5.1 mEq/L 4.0 4.9 4.3  Chloride 101 - 111 mmol/L - - 109  CO2 22 - 29 mEq/L 23 29 21(L)  Calcium 8.4 - 10.4 mg/dL 9.3 9.2 8.2(L)  Total Protein 6.4 - 8.3 g/dL - 6.7 -  Total Bilirubin 0.20 - 1.20 mg/dL - 0.46 -  Alkaline Phos 40 - 150 U/L - 219(H) -  AST 5 - 34 U/L - 44(H) -  ALT 0 - 55 U/L - 32 -   Mag 1.0 today   INITIAL tumor markers AFP 3.2 CEA > 10,000 CA 19-9 12,929.6  Results for BERNHARD, KOSKINEN (MRN [id number]) as of 10/17/2015 07:30  Ref. Range 07/05/2015 08:11 08/09/2015 08:40 09/06/2015 09:57 10/03/2015 08:49  CA 19-9 Latest Ref Range: <35.0 U/mL 310.6 (H) 49.7 (H) 53.7 (H) 62.3 (H)  CEA Latest Ref Range: 0.0-5.0 ng/mL 386.8 (H) 96.3 (H) 73.7  (H) 66.4 (H)      Pathology report  Liver, needle/core biopsy - METASTATIC ADENOCARCINOMA, SEE COMMENT. Microscopic Comment The adenocarcinoma demonstrates the following immunophenotype: Cytokeratin 7 - negative expression. Cytokeratin 20 - strong diffuse expression. CD2 - strong diffuse expression. Overall the morphology and immunophenotype are that of metastatic adenocarcinoma primary to colorectum. The recent nuclear medicine scan demonstrating sigmoid mass with associated liver masses is noted.  [facility] test result:    RADIOGRAPHIC STUDIES: I have personally reviewed the outside CT scan image with patient and his son.   CT chest, abdomen and pelvis with IV contrast oN  11/30/2015 IMPRESSION: Mild residual wall thickening involving the distal descending/ proximal sigmoid colon, possibly reflecting a combination residual tumor and/or inflammatory changes.  Improving hepatic metastases, with index lesions as above. Right adrenal metastases, unchanged. Mild abdominopelvic nodal metastases, grossly unchanged.  No evidence of metastatic disease in the chest.  ASSESSMENT & PLAN:  70 year old [place] male, with past history of hypertension and dilated nonischemic gammopathy with EF 25%, no clinical signs of heart failure, who was found to have hepatitis B infection lately, and multiple liver lesions on the CT scan. He has extremely high CEA and CA 19-9 levels. PET scan reviewed a hypermetabolic sigmoid colon mass, diffuse liver metastasis, probable lung and adrenal gland metastasis.  1. Metastatic sigmoid colon cancer, with diffuse liver, lungs, node and left adrenal gland metastases. KRAS/NRAS wild type, MSI-stable -Liver biopsy showed metastatic adenocarcinoma. His tumor were strongly positive for CK20 and CD2, consistent with primary colorectal primary. KRAS and NRAS mutations were not detected.  -Pt understands that this is incurable cancer, and he has very high disease  burden and overall prognosis is poor. The treatment goal is palliative -He had excellent response to first-line FOLFOX, but treatment was complicated with GI bleeding and neutropenic  fever, resolved now. -he is currently on second line chemotherapy with irritecan and panitumumab, tolerating well  -His CEA has come down again, he is likely responding to treatment well - I reviewed his restaging CT scan from last week, which showed a continuous responding in liver metastasis, no other new lesions. - continue  Chemotherapy. Lab reviewed, adequate for treatment.  2. Grade 1-2 skin rashes, improved  -Secondary to panitumumab, improved and stable lately  -continue hydrocortisone 2.5%, and clindamycin gel 1%  twice daily as needed  -He knows to avoid sun exposure, and call me if it gets worse.  3. Gastric ulcer with significant GI bleeding in April and Aug 2016 -He is on PPI, continue once daily  -Repeat his EGD on 08/15/2015 showed near complete healing of his gastric ulcer -continue Nadolol 64m bid, per Dr. SFuller Plan- I encourage him to follow-up with Dr. SFuller Plan 4. Poorly controlled diabetes -HbA1c was 8.5, blood glucose not well controlled  -I encourage him to follow-up with his primary care physician Dr. WJimmye [place] 5. HTN, Dilated nonischemic ischemia cardiomyopathy with EF 25% -He is clinically doing well without symptoms of CHF. However this is probably going to impact his chemotherapy.Will try to avoid cardiotoxic chemotherapy agent and avoid fluid overload during chemotherapy. -Continue follow-up with cardiology.  6 Hepatitis B carrier, with mild portal hypertension  -Per liver clinic, no need for treatment. Follow-up with Dr. SSilvio Pate  7. Malnutrition -I encouraged him to eat more, and take supplements as needed. -follow up with Dietitian   8. Anemia secondary to GI bleeding, iron deficiency and chemo  -Repeat lab on 06/06/2015 showed ferritin 117, serum iron 24, saturation 8%, which  supports iron deficiency -He received IV Feraheme again in Aug 2016 after GI bleeding  -Repeat a ferritin was 273 on 10/03/2015, much improved  -he received iv feraheme again on 11/27/2015, but anemia did not improve much.  11. hypomagnesemia  -secondary to panitumumab -he receives IV mag 6 g with each infusion -He is taking magnesium pill 2 tablets 3 times a day, tolerating well no diarrhea. -follow up closely.    Plan -IV mag (in 5067m and second dose feraheem today -continue chemo with irinotecan and panitumumab, treatment tomorrow  -6g iv mag today with chemo  -RTC in 1 week for iv mag and see if he needs blood transfusion  - return to clinic in 2 weeks with next cycle chemotherapy -his BP slightly low today,  But clinically doing well, we'll repeat his BP in the infusion room. - I encouraged him to call Dr. StSilvio Pateor follow-up  All questions were answered. The patient knows to call the clinic with any problems, questions or concerns.  I spent 25 minutes counseling the patient face to face. The total time spent in the appointment was 30 minutes and more than 50% was on counseling.     FeTruitt MerleMD 12/04/2015   11:22 AM

## 2015-12-05 ENCOUNTER — Ambulatory Visit (HOSPITAL_BASED_OUTPATIENT_CLINIC_OR_DEPARTMENT_OTHER): Payer: PPO

## 2015-12-05 VITALS — BP 108/61 | HR 77 | Temp 98.4°F | Resp 18

## 2015-12-05 DIAGNOSIS — Z5111 Encounter for antineoplastic chemotherapy: Secondary | ICD-10-CM

## 2015-12-05 DIAGNOSIS — C187 Malignant neoplasm of sigmoid colon: Secondary | ICD-10-CM | POA: Diagnosis not present

## 2015-12-05 DIAGNOSIS — Z5112 Encounter for antineoplastic immunotherapy: Secondary | ICD-10-CM | POA: Diagnosis not present

## 2015-12-05 MED ORDER — SODIUM CHLORIDE 0.9 % IV SOLN
Freq: Once | INTRAVENOUS | Status: AC
  Administered 2015-12-05: 09:00:00 via INTRAVENOUS

## 2015-12-05 MED ORDER — ATROPINE SULFATE 1 MG/ML IJ SOLN
0.5000 mg | Freq: Once | INTRAMUSCULAR | Status: AC | PRN
  Administered 2015-12-05: 0.5 mg via INTRAVENOUS

## 2015-12-05 MED ORDER — ATROPINE SULFATE 1 MG/ML IJ SOLN
INTRAMUSCULAR | Status: AC
  Filled 2015-12-05: qty 1

## 2015-12-05 MED ORDER — SODIUM CHLORIDE 0.9 % IV SOLN
Freq: Once | INTRAVENOUS | Status: AC
  Administered 2015-12-05: 09:00:00 via INTRAVENOUS
  Filled 2015-12-05: qty 4

## 2015-12-05 MED ORDER — SODIUM CHLORIDE 0.9 % IJ SOLN
10.0000 mL | INTRAMUSCULAR | Status: DC | PRN
  Administered 2015-12-05: 10 mL
  Filled 2015-12-05: qty 10

## 2015-12-05 MED ORDER — IRINOTECAN HCL CHEMO INJECTION 100 MG/5ML
160.0000 mg/m2 | Freq: Once | INTRAVENOUS | Status: AC
  Administered 2015-12-05: 272 mg via INTRAVENOUS
  Filled 2015-12-05: qty 10

## 2015-12-05 MED ORDER — HEPARIN SOD (PORK) LOCK FLUSH 100 UNIT/ML IV SOLN
500.0000 [IU] | Freq: Once | INTRAVENOUS | Status: AC | PRN
  Administered 2015-12-05: 500 [IU]
  Filled 2015-12-05: qty 5

## 2015-12-05 MED ORDER — PANITUMUMAB CHEMO INJECTION 100 MG/5ML
6.1000 mg/kg | Freq: Once | INTRAVENOUS | Status: AC
  Administered 2015-12-05: 400 mg via INTRAVENOUS
  Filled 2015-12-05: qty 20

## 2015-12-05 NOTE — Progress Notes (Signed)
LAbs adequate for treatment today per [name] note from yesterday.

## 2015-12-05 NOTE — Patient Instructions (Signed)
[place] Discharge Instructions for Patients Receiving Chemotherapy  Today you received the following chemotherapy agents Irinotecan/Vectibix/Magnesium.  To help prevent nausea and vomiting after your treatment, we encourage you to take your nausea medication as prescribed.   If you develop nausea and vomiting that is not controlled by your nausea medication, call the clinic.   BELOW ARE SYMPTOMS THAT SHOULD BE REPORTED IMMEDIATELY:  *FEVER GREATER THAN 100.5 F  *CHILLS WITH OR WITHOUT FEVER  NAUSEA AND VOMITING THAT IS NOT CONTROLLED WITH YOUR NAUSEA MEDICATION  *UNUSUAL SHORTNESS OF BREATH  *UNUSUAL BRUISING OR BLEEDING  TENDERNESS IN MOUTH AND THROAT WITH OR WITHOUT PRESENCE OF ULCERS  *URINARY PROBLEMS  *BOWEL PROBLEMS  UNUSUAL RASH Items with * indicate a potential emergency and should be followed up as soon as possible.  Feel free to call the clinic you have any questions or concerns. The clinic phone number is [phone].  Please show the [facility] at check-in to the Emergency Department and triage nurse.  Hypomagnesemia Hypomagnesemia is a condition in which the level of magnesium in the blood is low. Magnesium is a mineral that is found in many foods. It is used in many different processes in the body. Hypomagnesemia can affect every organ in the body. It can cause life-threatening problems. CAUSES Causes of hypomagnesemia include:  Not getting enough magnesium in your diet.  Malnutrition.  Problems with absorbing magnesium from the intestines.  Dehydration.  Alcohol abuse.  Vomiting.  Severe diarrhea.  Some medicines, including medicines that make you urinate more.  Certain diseases, such as kidney disease, diabetes, and overactive thyroid. SIGNS AND SYMPTOMS  Involuntary shaking or trembling of a body part (tremor).  Confusion.  Muscle weakness.  Sensitivity to light, sound, and touch.  Psychiatric issues, such  as depression, irritability, or psychosis.  Sudden tightening of muscles (muscle spasms).  Tingling in the arms and legs.  A feeling of fluttering of the heart. These symptoms are more severe if magnesium levels drop suddenly. DIAGNOSIS To make a diagnosis, your health care provider will do a physical exam and order blood and urine tests. TREATMENT Treatment will depend on the cause and the severity of your condition. It may involve:  A magnesium supplement. This can be taken in pill form. It can also be given through an IV tube. This is usually done if the condition is severe.  Changes to your diet. You may be directed to eat foods that have a lot of magnesium, such as green leafy vegetables, peas, beans, and nuts.  Eliminating alcohol from your diet. HOME CARE INSTRUCTIONS  Include foods with magnesium in your diet. Foods that are rich in magnesium include green vegetables, beans, nuts and seeds, and whole grains.  Take medicines only as directed by your health care provider.  Take magnesium supplements if your health care provider instructs you to do that. Take them as directed.  Have your magnesium levels monitored as directed by your health care provider.  When you are active, drink fluids that contain electrolytes.  Keep all follow-up visits as directed by your health care provider. This is important. SEEK MEDICAL CARE IF:  You get worse instead of better.  Your symptoms return. SEEK IMMEDIATE MEDICAL CARE IF:  Your symptoms are severe.   This information is not intended to replace advice given to you by your health care provider. Make sure you discuss any questions you have with your health care provider.   Document Released: 07/10/2005 Document  Revised: 11/04/2014 Document Reviewed: 05/30/2014 Elsevier Interactive Patient Education [employer].

## 2015-12-11 ENCOUNTER — Other Ambulatory Visit: Payer: Self-pay | Admitting: Hematology

## 2015-12-11 ENCOUNTER — Ambulatory Visit (HOSPITAL_BASED_OUTPATIENT_CLINIC_OR_DEPARTMENT_OTHER): Payer: PPO

## 2015-12-11 ENCOUNTER — Other Ambulatory Visit: Payer: Self-pay | Admitting: *Deleted

## 2015-12-11 VITALS — BP 104/52 | HR 76 | Temp 97.5°F | Resp 18

## 2015-12-11 DIAGNOSIS — D509 Iron deficiency anemia, unspecified: Secondary | ICD-10-CM

## 2015-12-11 DIAGNOSIS — C187 Malignant neoplasm of sigmoid colon: Secondary | ICD-10-CM

## 2015-12-11 DIAGNOSIS — C787 Secondary malignant neoplasm of liver and intrahepatic bile duct: Secondary | ICD-10-CM

## 2015-12-11 DIAGNOSIS — C189 Malignant neoplasm of colon, unspecified: Secondary | ICD-10-CM

## 2015-12-11 LAB — CBC WITH DIFFERENTIAL/PLATELET
BASO%: 0.7 % (ref 0.0–2.0)
BASOS ABS: 0 10*3/uL (ref 0.0–0.1)
EOS ABS: 0.1 10*3/uL (ref 0.0–0.5)
EOS%: 1.4 % (ref 0.0–7.0)
HEMATOCRIT: 26.4 % — AB (ref 38.4–49.9)
HEMOGLOBIN: 8.7 g/dL — AB (ref 13.0–17.1)
LYMPH#: 1.2 10*3/uL (ref 0.9–3.3)
LYMPH%: 31.4 % (ref 14.0–49.0)
MCH: 29.2 pg (ref 27.2–33.4)
MCHC: 33 g/dL (ref 32.0–36.0)
MCV: 88.5 fL (ref 79.3–98.0)
MONO#: 0.2 10*3/uL (ref 0.1–0.9)
MONO%: 6 % (ref 0.0–14.0)
NEUT#: 2.4 10*3/uL (ref 1.5–6.5)
NEUT%: 60.5 % (ref 39.0–75.0)
PLATELETS: 202 10*3/uL (ref 140–400)
RBC: 2.98 10*6/uL — ABNORMAL LOW (ref 4.20–5.82)
RDW: 17.3 % — AB (ref 11.0–14.6)
WBC: 3.9 10*3/uL — ABNORMAL LOW (ref 4.0–10.3)

## 2015-12-11 MED ORDER — SODIUM CHLORIDE 0.9 % IV SOLN
6.0000 g | Freq: Once | INTRAVENOUS | Status: AC
  Administered 2015-12-11: 6 g via INTRAVENOUS
  Filled 2015-12-11: qty 12

## 2015-12-11 MED ORDER — SODIUM CHLORIDE 0.9 % IV SOLN: 6.0000 g | Freq: Once | INTRAVENOUS | Status: DC

## 2015-12-11 MED ORDER — SODIUM CHLORIDE 0.9 % IV SOLN
INTRAVENOUS | Status: DC
  Administered 2015-12-11: 15:00:00 via INTRAVENOUS

## 2015-12-11 NOTE — Progress Notes (Signed)
1730: Pt refusing to stay for remainder of magnesium infusion. Pt and daughter explained importance of infusion and both verbalize understanding but continues to refuse to stay. Magnesium stopped, line flushed and pt discharge with daughter in stable condition. [name] aware.

## 2015-12-11 NOTE — Patient Instructions (Signed)
Hypomagnesemia Hypomagnesemia is a condition in which the level of magnesium in the blood is low. Magnesium is a mineral that is found in many foods. It is used in many different processes in the body. Hypomagnesemia can affect every organ in the body. It can cause life-threatening problems. CAUSES Causes of hypomagnesemia include:  Not getting enough magnesium in your diet.  Malnutrition.  Problems with absorbing magnesium from the intestines.  Dehydration.  Alcohol abuse.  Vomiting.  Severe diarrhea.  Some medicines, including medicines that make you urinate more.  Certain diseases, such as kidney disease, diabetes, and overactive thyroid. SIGNS AND SYMPTOMS  Involuntary shaking or trembling of a body part (tremor).  Confusion.  Muscle weakness.  Sensitivity to light, sound, and touch.  Psychiatric issues, such as depression, irritability, or psychosis.  Sudden tightening of muscles (muscle spasms).  Tingling in the arms and legs.  A feeling of fluttering of the heart. These symptoms are more severe if magnesium levels drop suddenly. DIAGNOSIS To make a diagnosis, your health care provider will do a physical exam and order blood and urine tests. TREATMENT Treatment will depend on the cause and the severity of your condition. It may involve:  A magnesium supplement. This can be taken in pill form. It can also be given through an IV tube. This is usually done if the condition is severe.  Changes to your diet. You may be directed to eat foods that have a lot of magnesium, such as green leafy vegetables, peas, beans, and nuts.  Eliminating alcohol from your diet. HOME CARE INSTRUCTIONS  Include foods with magnesium in your diet. Foods that are rich in magnesium include green vegetables, beans, nuts and seeds, and whole grains.  Take medicines only as directed by your health care provider.  Take magnesium supplements if your health care provider instructs you to  do that. Take them as directed.  Have your magnesium levels monitored as directed by your health care provider.  When you are active, drink fluids that contain electrolytes.  Keep all follow-up visits as directed by your health care provider. This is important. SEEK MEDICAL CARE IF:  You get worse instead of better.  Your symptoms return. SEEK IMMEDIATE MEDICAL CARE IF:  Your symptoms are severe.   This information is not intended to replace advice given to you by your health care provider. Make sure you discuss any questions you have with your health care provider.   Document Released: 07/10/2005 Document Revised: 11/04/2014 Document Reviewed: 05/30/2014 Elsevier Interactive Patient Education 2016 [place] injection What is this medicine? FERUMOXYTOL is an iron complex. Iron is used to make healthy red blood cells, which carry oxygen and nutrients throughout the body. This medicine is used to treat iron deficiency anemia in people with chronic kidney disease. This medicine may be used for other purposes; ask your health care provider or pharmacist if you have questions. What should I tell my health care provider before I take this medicine? They need to know if you have any of these conditions: -anemia not caused by low iron levels -high levels of iron in the blood -magnetic resonance imaging (MRI) test scheduled -an unusual or allergic reaction to iron, other medicines, foods, dyes, or preservatives -pregnant or trying to get pregnant -breast-feeding How should I use this medicine? This medicine is for injection into a vein. It is given by a health care professional in a hospital or clinic setting. Talk to your pediatrician regarding the use  of this medicine in children. Special care may be needed. Overdosage: If you think you have taken too much of this medicine contact a poison control center or emergency room at once. NOTE: This medicine is only for  you. Do not share this medicine with others. What if I miss a dose? It is important not to miss your dose. Call your doctor or health care professional if you are unable to keep an appointment. What may interact with this medicine? This medicine may interact with the following medications: -other iron products This list may not describe all possible interactions. Give your health care provider a list of all the medicines, herbs, non-prescription drugs, or dietary supplements you use. Also tell them if you smoke, drink alcohol, or use illegal drugs. Some items may interact with your medicine. What should I watch for while using this medicine? Visit your doctor or healthcare professional regularly. Tell your doctor or healthcare professional if your symptoms do not start to get better or if they get worse. You may need blood work done while you are taking this medicine. You may need to follow a special diet. Talk to your doctor. Foods that contain iron include: whole grains/cereals, dried fruits, beans, or peas, leafy green vegetables, and organ meats (liver, kidney). What side effects may I notice from receiving this medicine? Side effects that you should report to your doctor or health care professional as soon as possible: -allergic reactions like skin rash, itching or hives, swelling of the face, lips, or tongue -breathing problems -changes in blood pressure -feeling faint or lightheaded, falls -fever or chills -flushing, sweating, or hot feelings -swelling of the ankles or feet Side effects that usually do not require medical attention (Report these to your doctor or health care professional if they continue or are bothersome.): -diarrhea -headache -nausea, vomiting -stomach pain This list may not describe all possible side effects. Call your doctor for medical advice about side effects. You may report side effects to FDA at 1-800-FDA-1088. Where should I keep my medicine? This drug is  given in a hospital or clinic and will not be stored at home. NOTE: This sheet is a summary. It may not cover all possible information. If you have questions about this medicine, talk to your doctor, pharmacist, or health care provider.    2016, Elsevier/Gold Standard. (2012-05-29 15:23:36)

## 2015-12-12 ENCOUNTER — Telehealth: Payer: Self-pay | Admitting: Hematology

## 2015-12-12 NOTE — Telephone Encounter (Signed)
per pof to sch MD appt-pt aware

## 2015-12-17 ENCOUNTER — Other Ambulatory Visit: Payer: Self-pay | Admitting: Hematology

## 2015-12-17 DIAGNOSIS — C187 Malignant neoplasm of sigmoid colon: Secondary | ICD-10-CM

## 2015-12-18 ENCOUNTER — Telehealth: Payer: Self-pay | Admitting: Hematology

## 2015-12-18 ENCOUNTER — Other Ambulatory Visit (HOSPITAL_COMMUNITY)
Admission: RE | Admit: 2015-12-18 | Discharge: 2015-12-18 | Disposition: A | Discharge status: Home / Self Care | Payer: PPO | Source: Ambulatory Visit | Attending: Hematology | Admitting: Hematology

## 2015-12-18 ENCOUNTER — Ambulatory Visit (HOSPITAL_BASED_OUTPATIENT_CLINIC_OR_DEPARTMENT_OTHER): Payer: PPO

## 2015-12-18 ENCOUNTER — Other Ambulatory Visit (HOSPITAL_BASED_OUTPATIENT_CLINIC_OR_DEPARTMENT_OTHER): Payer: PPO

## 2015-12-18 ENCOUNTER — Encounter: Payer: Self-pay | Admitting: Hematology

## 2015-12-18 ENCOUNTER — Telehealth: Payer: Self-pay | Admitting: *Deleted

## 2015-12-18 ENCOUNTER — Ambulatory Visit (HOSPITAL_BASED_OUTPATIENT_CLINIC_OR_DEPARTMENT_OTHER): Payer: PPO | Admitting: Hematology

## 2015-12-18 VITALS — BP 91/59 | HR 60 | Temp 97.6°F | Resp 17 | Ht 65.0 in | Wt 130.7 lb

## 2015-12-18 VITALS — BP 107/53 | HR 56

## 2015-12-18 DIAGNOSIS — C7972 Secondary malignant neoplasm of left adrenal gland: Secondary | ICD-10-CM | POA: Diagnosis not present

## 2015-12-18 DIAGNOSIS — E860 Dehydration: Secondary | ICD-10-CM

## 2015-12-18 DIAGNOSIS — D500 Iron deficiency anemia secondary to blood loss (chronic): Secondary | ICD-10-CM

## 2015-12-18 DIAGNOSIS — D509 Iron deficiency anemia, unspecified: Secondary | ICD-10-CM

## 2015-12-18 DIAGNOSIS — Z452 Encounter for adjustment and management of vascular access device: Secondary | ICD-10-CM

## 2015-12-18 DIAGNOSIS — C787 Secondary malignant neoplasm of liver and intrahepatic bile duct: Secondary | ICD-10-CM | POA: Diagnosis not present

## 2015-12-18 DIAGNOSIS — I5042 Chronic combined systolic (congestive) and diastolic (congestive) heart failure: Secondary | ICD-10-CM

## 2015-12-18 DIAGNOSIS — C187 Malignant neoplasm of sigmoid colon: Secondary | ICD-10-CM

## 2015-12-18 DIAGNOSIS — C772 Secondary and unspecified malignant neoplasm of intra-abdominal lymph nodes: Secondary | ICD-10-CM | POA: Diagnosis not present

## 2015-12-18 DIAGNOSIS — D6481 Anemia due to antineoplastic chemotherapy: Secondary | ICD-10-CM

## 2015-12-18 DIAGNOSIS — Z95828 Presence of other vascular implants and grafts: Secondary | ICD-10-CM

## 2015-12-18 DIAGNOSIS — E119 Type 2 diabetes mellitus without complications: Secondary | ICD-10-CM

## 2015-12-18 DIAGNOSIS — R197 Diarrhea, unspecified: Secondary | ICD-10-CM

## 2015-12-18 DIAGNOSIS — I10 Essential (primary) hypertension: Secondary | ICD-10-CM

## 2015-12-18 LAB — CBC WITH DIFFERENTIAL/PLATELET
BASO%: 0.8 % (ref 0.0–2.0)
Basophils Absolute: 0 10*3/uL (ref 0.0–0.1)
EOS ABS: 0.1 10*3/uL (ref 0.0–0.5)
EOS%: 3.4 % (ref 0.0–7.0)
HEMATOCRIT: 22.7 % — AB (ref 38.4–49.9)
HEMOGLOBIN: 7.8 g/dL — AB (ref 13.0–17.1)
LYMPH#: 0.9 10*3/uL (ref 0.9–3.3)
LYMPH%: 23.4 % (ref 14.0–49.0)
MCH: 29.4 pg (ref 27.2–33.4)
MCHC: 34.4 g/dL (ref 32.0–36.0)
MCV: 85.7 fL (ref 79.3–98.0)
MONO#: 0.8 10*3/uL (ref 0.1–0.9)
MONO%: 20 % — ABNORMAL HIGH (ref 0.0–14.0)
NEUT%: 52.4 % (ref 39.0–75.0)
NEUTROS ABS: 2 10*3/uL (ref 1.5–6.5)
PLATELETS: 265 10*3/uL (ref 140–400)
RBC: 2.65 10*6/uL — ABNORMAL LOW (ref 4.20–5.82)
RDW: 16.1 % — AB (ref 11.0–14.6)
WBC: 3.9 10*3/uL — AB (ref 4.0–10.3)

## 2015-12-18 LAB — COMPREHENSIVE METABOLIC PANEL
ALT: 27 U/L (ref 0–55)
ANION GAP: 9 meq/L (ref 3–11)
AST: 32 U/L (ref 5–34)
Albumin: 2.8 g/dL — ABNORMAL LOW (ref 3.5–5.0)
Alkaline Phosphatase: 238 U/L — ABNORMAL HIGH (ref 40–150)
BILIRUBIN TOTAL: 0.65 mg/dL (ref 0.20–1.20)
BUN: 12.8 mg/dL (ref 7.0–26.0)
CHLORIDE: 103 meq/L (ref 98–109)
CO2: 23 meq/L (ref 22–29)
CREATININE: 0.8 mg/dL (ref 0.7–1.3)
Calcium: 7.7 mg/dL — ABNORMAL LOW (ref 8.4–10.4)
GLUCOSE: 114 mg/dL (ref 70–140)
Potassium: 4 mEq/L (ref 3.5–5.1)
SODIUM: 135 meq/L — AB (ref 136–145)
TOTAL PROTEIN: 6.9 g/dL (ref 6.4–8.3)

## 2015-12-18 LAB — MAGNESIUM: MAGNESIUM: 0.7 mg/dL — AB (ref 1.5–2.5)

## 2015-12-18 MED ORDER — SODIUM CHLORIDE 0.9 % IV SOLN
INTRAVENOUS | Status: AC
  Administered 2015-12-18: 15:00:00 via INTRAVENOUS

## 2015-12-18 MED ORDER — PANTOPRAZOLE SODIUM 40 MG PO TBEC: 40.0000 mg | DELAYED_RELEASE_TABLET | Freq: Every day | ORAL | Status: DC

## 2015-12-18 MED ORDER — SODIUM CHLORIDE 0.9 % IV SOLN
Freq: Once | INTRAVENOUS | Status: AC
  Administered 2015-12-18: 17:00:00 via INTRAVENOUS

## 2015-12-18 MED ORDER — HEPARIN SOD (PORK) LOCK FLUSH 100 UNIT/ML IV SOLN
500.0000 [IU] | Freq: Once | INTRAVENOUS | Status: AC
  Administered 2015-12-18: 500 [IU] via INTRAVENOUS
  Filled 2015-12-18: qty 5

## 2015-12-18 MED ORDER — SODIUM CHLORIDE 0.9 % IV SOLN
6.0000 g | Freq: Once | INTRAVENOUS | Status: AC
  Administered 2015-12-18: 6 g via INTRAVENOUS
  Filled 2015-12-18: qty 150

## 2015-12-18 MED ORDER — SODIUM CHLORIDE 0.9 % IV SOLN: 6.0000 g | Freq: Once | INTRAVENOUS | Status: DC

## 2015-12-18 MED ORDER — SODIUM CHLORIDE 0.9% FLUSH
10.0000 mL | INTRAVENOUS | Status: DC | PRN
  Filled 2015-12-18: qty 10

## 2015-12-18 MED ORDER — SODIUM CHLORIDE 0.9% FLUSH
10.0000 mL | INTRAVENOUS | Status: DC | PRN
  Administered 2015-12-18 (×2): 10 mL via INTRAVENOUS
  Filled 2015-12-18: qty 10

## 2015-12-18 NOTE — Telephone Encounter (Signed)
Pt confirmed labs/ov per 02/20 POF, gave pt AVS and Calendar... KJ, sent msg to add IVF to next visit with NP/CB

## 2015-12-18 NOTE — Patient Instructions (Signed)

## 2015-12-18 NOTE — Progress Notes (Signed)
Isolation precautions enacted for reports of Diarrhea.  [name] has ordered C-Diff x two specimens.  Explained to pt via Interpreter need for stool specimen and need to let nurse know when he has to use the Bathroom.

## 2015-12-18 NOTE — Telephone Encounter (Signed)
Per staff message and POF I have tried  scheduled appts. Advised scheduler no available on 2/22s. JMW

## 2015-12-18 NOTE — Progress Notes (Signed)
[facility]  [phone] [phone]  Clinic follow up Note   Patient Care Team: [name], MD as PCP - General (Specialist) [name], CRNP as Nurse Practitioner (Nurse Practitioner) Truitt Merle, MD as Consulting Physician (Hematology) Ladene Artist, MD as Consulting Physician (Gastroenterology) Dannielle Karvonen, RN as [place] Management 12/18/2015  CHIEF COMPLAINTS Follow up metastatic sigmoid colon cancer  Oncology History   He is a Metastatic colon cancer to liver   Staging form: Colon and Rectum, AJCC 7th Edition     Clinical: Stage Unknown ([place], NX, M1) - Unsigned      Cancer of sigmoid colon ([place])   10/28/2014 Tumor Marker AFP 3.2 CEA > 10,000 CA 19-9 12,929.6. tumor (-) KRAS and NRAS mutation.    11/10/2014 Imaging PET scan showed hypermetabolic mass in the sigmoid colon was noted metastasis in the retroperitoneum. Probable left adrenal and pulmonary metastasis, and diffuse liver metastasis.   11/21/2014 Initial Diagnosis Metastatic colon cancer to liver, lung, abd nodes and left adrenal gland. Diagnosis was made by liver biopsy.    11/30/2014 -  Chemotherapy First line chemo [id number], Panitumumab added from second cycle    12/28/2014 - 12/31/2014 Hospital Admission Was admitted for dehydration, neutropenia fever with UTI, and severe skin rash.   02/08/2015 - 02/12/2015 Hospital Admission He was admitted for upper GI bleeding, e.g. showed a gastric ulcer with clots, status post appendectomy injection. He also received a blood transfusion.   03/01/2015 Tumor Marker CEA 694, CA19.9 462   03/13/2015 Imaging CT CAP showed partial response, no new lesions.    03/15/2015 - 05/23/2015 Chemotherapy restart FOLFOX, held again on 8/9 due to GI bleeding    05/31/2015 - 06/02/2015 Hospital Admission He was admitted to [facility] in [place] due to upper GI bleeding, EGD showed gastric and dudenal ulcers    06/27/2015 - 06/29/2015 Hospital  Admission he was admitted for dairrhea and pancolitis, c-diff and stool cultures were negative, treated with antibiotics   07/12/2015 -  Chemotherapy panitumumab '6mg'$ /kg, every 2 weeks   11/08/2015 - 11/10/2015 Hospital Admission Pt was admitted for sepsis and hypotension, was found to have (+) influenza A and treated.    11/30/2015 Imaging  CT scans reviewed continued improvement in the liver metastasis, no other new lesions.     HISTORY OF PRESENTING ILLNESS:  Jason Reilly 70 y.o. male is here because of abnormal CT findings, which is very suspicious for malignancy. He is on ranitidine from [place], has been on in the [place] for 16 years. He came in with his son and an interpreter.  He has been feeling fatigued since two month ago. He is still able to do all ADLs. He otherwise denies any pain, bloating or nausea.  He lost about 20lbs in 3 month. His appetite is lower than before, eats less, no change of his bowl habits.  She denied any hematochezia or melana. Per his son, he has had some personality changes daily, irritable, slightly confused some time.  He was evaluated by his primary care physician. Lab test reviewed hepatitis B infection, which he did not know before, and elevated alkaline phosphatase, his liver function and the rest of the liver function was not remarkable. [place] of abdomen was obtained on 07/22/2014, which showed diffusely abnormal liver with multiple echogenic lesions. CT of abdomen with and without contrast was done on 08/26/2014, which reviewed here at a medically with multiple large partially calcified hepatic masses consistent with metastatic  disease. Mild retroperitoneal adenopathy with the largest node measuring 1.6 cm. And nonspecific 1.4 cm left adrenal nodule was also noticed. His tumor marker showed CEA greater than 10,000, CA 19-9 12,929, AFP 3.2 (normal). He was referred to [facility] liver clinic and was evaluated by nurse practitioner [name]. Treatment for  hepatitis B was not recommended based on his virus load.  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 He was evaluated by a cardiologist in 2014. He denies any significant dyspnea on exertion. No leg swollen.  CURRENT THERAPY: panitumumab '6mg'$ /kg, every 2 weeks, started on 07/12/2015, Irinotecan '180mg'$ /m2 every 2 weeks added on 09/19/2015, dose decreased to '160mg'$ /m2 from cycle 2   INTERIM HISTORY: Abdulrahim returns for follow-up and chemotherapy. He is accompanied by his son and interpreter to the clinic today. He has had diarrhea for the past week, 3-4 times a day, (+) urgency and stool incontinuence, no N/V, and he had chills, no fever. He has moderate fatigue, he is able to drink enough, has been eating less, no nausea. He overall feels quite fatigued, much less active lately.    MEDICAL HISTORY:  Past Medical History  Diagnosis Date  . Tachycardia   . Abnormal EKG   . Hypertension   . Non-ischemic cardiomyopathy ([place])   . Hepatitis B   . H. pylori infection   . Gastric ulcer   . Metastatic colon cancer to liver ([place])   . Diabetes mellitus without complication ([place])     metformin    SURGICAL HISTORY: Past Surgical History  Procedure Laterality Date  . Nuclear stress test  03/03/2013    High risk - consistent with nonischemic cardiomyopathy  . Left and right heart catheterization with coronary angiogram N/A 03/29/2013    Procedure: LEFT AND RIGHT HEART CATHETERIZATION WITH CORONARY ANGIOGRAM;  Surgeon: Pixie Casino, MD;  Location: [facility] CATH LAB;  Service: Cardiovascular;  Laterality: N/A;  . Esophagogastroduodenoscopy N/A 02/08/2015    Procedure: ESOPHAGOGASTRODUODENOSCOPY (EGD);  Surgeon: Ladene Artist, MD;  Location: [facility] ENDOSCOPY;  Service: Endoscopy;  Laterality: N/A;  . Esophagogastroduodenoscopy (egd) with propofol N/A 08/15/2015    Procedure: ESOPHAGOGASTRODUODENOSCOPY (EGD) WITH PROPOFOL;  Surgeon: Ladene Artist, MD;  Location: [facility]  ENDOSCOPY;  Service: Endoscopy;  Laterality: N/A;    SOCIAL HISTORY: Social History   Social History  . Marital Status: Married    Spouse Name: N/A  . Number of Children: N/A  . Years of Education: N/A   Occupational History  . Not on file.   Social History Main Topics  . Smoking status: Former Smoker    Types: Cigarettes    Quit date: 11/07/2008  . Smokeless tobacco: Never Used  . Alcohol Use: Yes     Comment: occasional  . Drug Use: No  . Sexual Activity: No   Other Topics Concern  . Not on file   Social History Narrative   Married   Enjoys walking   Has lived in [place] > 16 years    FAMILY HISTORY: No family history of liver disease or malignancy.  ALLERGIES:  has No Known Allergies.  MEDICATIONS:  Current Outpatient Prescriptions on File Prior to Visit  Medication Sig Dispense Refill  . clindamycin (CLINDAGEL) 1 % gel Apply topically 2 (two) times daily. 30 g 2  . clindamycin (CLINDAGEL) 1 % gel Apply topically 2 (two) times daily. 30 g 2  . folic acid (FOLVITE) 1 MG tablet Take 1 tablet (1 mg total) by mouth  daily. 30 tablet 3  . lidocaine-prilocaine (EMLA) cream Apply 1 application topically as needed. Apply to [facility] cath at least one hour before needle stick. 30 g 2  . loperamide (IMODIUM) 2 MG capsule Take 2 capsules (4 mg total) by mouth 4 (four) times daily as needed for diarrhea or loose stools (NO MORE THAN 8 TABLETS PER DAY.). 60 capsule 3  . magic mouthwash SOLN Take 5 mLs by mouth 4 (four) times daily. (Patient taking differently: Take 5 mLs by mouth 4 (four) times daily as needed. ) 120 mL 1  . magnesium oxide (MAG-OX) 400 (241.3 Mg) MG tablet Take 1 tablet (400 mg total) by mouth 3 (three) times daily. 60 tablet 1  . metFORMIN (GLUCOPHAGE) 500 MG tablet Take 1 tablet (500 mg total) by mouth 2 (two) times daily with a meal. THIS IS A ONE TIME ORDER.  FUTURE REFILLS NEED TO BE DONE BY PRIMARY MD. 90 tablet 0  . nadolol (CORGARD) 20 MG tablet Take 1 tablet  (20 mg total) by mouth 2 (two) times daily. THIS IS A ONE TIME ORDER.  FUTURE REFILLS NEED TO BE DONE BY PRIMARY MD. 90 tablet 0  . ondansetron (ZOFRAN) 8 MG tablet Take 1 tablet (8 mg total) by mouth every 8 (eight) hours as needed for nausea or vomiting. 45 tablet 2  . oseltamivir (TAMIFLU) 75 MG capsule Take 1 capsule (75 mg total) by mouth 2 (two) times daily. 6 capsule 0  . pantoprazole (PROTONIX) 40 MG tablet Take 1 tablet (40 mg total) by mouth daily. 90 tablet 1  . prochlorperazine (COMPAZINE) 10 MG tablet Take 1 tablet (10 mg total) by mouth every 6 (six) hours as needed for nausea or vomiting. 30 tablet 1   Current Facility-Administered Medications on File Prior to Visit  Medication Dose Route Frequency Provider Last Rate Last Dose  . sodium chloride 0.9 % injection 10 mL  10 mL Intracatheter PRN Malachy Mood, MD   10 mL at 10/03/15 1705  . sodium chloride flush (NS) 0.9 % injection 10 mL  10 mL Intravenous PRN Malachy Mood, MD   10 mL at 12/18/15 1207  ;   REVIEW OF SYSTEMS:   Constitutional: Denies fevers, chills or abnormal night sweats, (+) fatigue  Eyes: Denies blurriness of vision, double vision or watery eyes Ears, nose, mouth, throat, and face: Denies mucositis or sore throat Respiratory: Denies cough, dyspnea or wheezes Cardiovascular: Denies palpitation, chest discomfort or lower extremity swelling Gastrointestinal: Denies nausea, heartburn or change in bowel habits Skin: Denies abnormal skin rashes Lymphatics: Denies new lymphadenopathy or easy bruising Neurological:Denies numbness, tingling or new weaknesses Behavioral/Psych: Mood is stable, no new changes, (+) insomnia All other systems were reviewed with the patient and are negative.  PHYSICAL EXAMINATION: BP 91/59 mmHg  Pulse 60  Resp 17  Ht 5\' 5"  (1.651 m)  Wt 130 lb 11.2 oz (59.285 kg)  BMI 21.75 kg/m2  SpO2 98%  ECOG PERFORMANCE STATUS: 1 Vital sign were taken at in the infusion room, within normal  limits. GENERAL:alert, no distress and comfortable SKIN: (+) Dry skin,  no skin ulcer, (+) diffuse skin rashes on his neck and upper chest, some are resolving with skin pigmentation. No discharge. EYES: normal, conjunctiva are pink and non-injected, sclera clear OROPHARYNX:no exudate, no erythema and lips, buccal mucosa, and tongue with mild discoloration at the tip.  NECK: supple, thyroid normal size, non-tender, without nodularity LYMPH:  no palpable lymphadenopathy in the cervical, axillary or inguinal LUNGS:  clear to auscultation and percussion with normal breathing effort, no rales   HEART: regular rate & rhythm and no murmurs and no lower extremity edema ABDOMEN:abdomen soft, non-tender, no hepatomegaly, no splenomegaly and normal bowel sounds Musculoskeletal:no cyanosis of digits and no clubbing  PSYCH: alert & oriented x 3 with fluent speech NEURO: no focal motor/sensory deficits  LABORATORY DATA:  I have reviewed the data as listed CBC Latest Ref Rng 12/18/2015 12/11/2015 12/04/2015  WBC 4.0 - 10.3 10e3/uL 3.9(L) 3.9(L) 4.1  Hemoglobin 13.0 - 17.1 g/dL 7.8(L) 8.7(L) 8.5(L)  Hematocrit 38.4 - 49.9 % 22.7(L) 26.4(L) 26.0(L)  Platelets 140 - 400 10e3/uL 265 202 179   ANC is 1.3  CMP Latest Ref Rng 12/18/2015 12/04/2015 11/27/2015  Glucose 70 - 140 mg/dl 114 170(H) 153(H)  BUN 7.0 - 26.0 mg/dL 12.8 16.9 19.8  Creatinine 0.7 - 1.3 mg/dL 0.8 1.2 1.0  Sodium 136 - 145 mEq/L 135(L) 135(L) 139  Potassium 3.5 - 5.1 mEq/L 4.0 4.3 4.0  CO2 22 - 29 mEq/L _0 Calcium 8.4 - 10.4 mg/dL 7.7(L) 8.2(L) 9.3  Total Protein 6.4 - 8.3 g/dL 6.9 6.9 -  Total Bilirubin 0.20 - 1.20 mg/dL 0.65 0.55 -  Alkaline Phos 40 - 150 U/L 238(H) 224(H) -  AST 5 - 34 U/L 32 33 -  ALT 0 - 55 U/L 27 26 -   Mag 1.0 today   INITIAL tumor markers AFP 3.2 CEA > 10,000 CA 19-9 12,929.6  Results for LORIK, GUO (MRN [id number]) as of 12/18/2015 07:29  Ref. Range 08/09/2015 08:40 09/06/2015 09:57 10/03/2015 08:49  10/31/2015 08:01  CA 19-9 Latest Ref Range: <35.0 U/mL 49.7 (H) 53.7 (H) 62.3 (H) 40.7 (H)  CEA Latest Ref Range: 0.0-5.0 ng/mL 96.3 (H) 73.7 (H) 66.4 (H) 50.6 (H)     Pathology report  Liver, needle/core biopsy - METASTATIC ADENOCARCINOMA, SEE COMMENT. Microscopic Comment The adenocarcinoma demonstrates the following immunophenotype: Cytokeratin 7 - negative expression. Cytokeratin 20 - strong diffuse expression. CD2 - strong diffuse expression. Overall the morphology and immunophenotype are that of metastatic adenocarcinoma primary to colorectum. The recent nuclear medicine scan demonstrating sigmoid mass with associated liver masses is noted.  [facility] test result:    RADIOGRAPHIC STUDIES: I have personally reviewed the outside CT scan image with patient and his son.   CT chest, abdomen and pelvis with IV contrast oN  11/30/2015 IMPRESSION: Mild residual wall thickening involving the distal descending/ proximal sigmoid colon, possibly reflecting a combination residual tumor and/or inflammatory changes.  Improving hepatic metastases, with index lesions as above. Right adrenal metastases, unchanged. Mild abdominopelvic nodal metastases, grossly unchanged.  No evidence of metastatic disease in the chest.  ASSESSMENT & PLAN:  70 year old [place] male, with past history of hypertension and dilated nonischemic gammopathy with EF 25%, no clinical signs of heart failure, who was found to have hepatitis B infection lately, and multiple liver lesions on the CT scan. He has extremely high CEA and CA 19-9 levels. PET scan reviewed a hypermetabolic sigmoid colon mass, diffuse liver metastasis, probable lung and adrenal gland metastasis.  1. Metastatic sigmoid colon cancer, with diffuse liver, lungs, node and left adrenal gland metastases. KRAS/NRAS wild type, MSI-stable -Liver biopsy showed metastatic adenocarcinoma. His tumor were strongly positive for CK20 and CD2, consistent with  primary colorectal primary. KRAS and NRAS mutations were not detected.  -Pt understands that this is incurable cancer, and he has very high disease burden and overall prognosis is poor. The treatment goal is  palliative -He had excellent response to first-line FOLFOX, but treatment was complicated with GI bleeding and neutropenic fever, resolved now. -he is currently on second line chemotherapy with irritecan and panitumumab, tolerating well  -His CEA has come down again, he is likely responding to treatment well - I reviewed his restaging CT scan from 11/30/2015, which showed a continuous responding in liver metastasis, no other new lesions. -Due to his borderline hypertension, diarrhea, I'll hold chemotherapy today.  2. Diarrhea and dehydration, ruled out infection -His diarrhea is probably related to chemotherapy, but I'll also ruled out infection, especially C. difficile colitis. We'll check his stool -IV fluids with 1 L of normal saline today -Blood culture -If C. difficile negative, he will continue use Imodium and Lomotil -Close follow-up in 2 days  2. Grade 1-2 skin rashes, improved  -Secondary to panitumumab, improved and stable lately  -continue hydrocortisone 2.5%, and clindamycin gel 1%  twice daily as needed  -He knows to avoid sun exposure, and call me if it gets worse.  3. Gastric ulcer with significant GI bleeding in April and Aug 2016 -He is on PPI, continue once daily  -Repeat his EGD on 08/15/2015 showed near complete healing of his gastric ulcer -continue Nadolol 31m bid, per Dr. SFuller Plan- I encourage him to follow-up with Dr. SFuller Plan 4. Poorly controlled diabetes -HbA1c was 8.5, blood glucose not well controlled  -I encourage him to follow-up with his primary care physician Dr. WJimmye [place] 5. HTN, Dilated nonischemic ischemia cardiomyopathy with EF 25% -He is clinically doing well without symptoms of CHF. However this is probably going to impact his chemotherapy.Will try  to avoid cardiotoxic chemotherapy agent and avoid fluid overload during chemotherapy. -Continue follow-up with cardiology.  6 Hepatitis B carrier, with mild portal hypertension  -Per liver clinic, no need for treatment. Follow-up with Dr. SSilvio Pate  7. Malnutrition -I encouraged him to eat more, and take supplements as needed. -follow up with Dietitian   8. Anemia secondary to GI bleeding, iron deficiency and chemo  -Repeat lab on 06/06/2015 showed ferritin 117, serum iron 24, saturation 8%, which supports iron deficiency -He received IV Feraheme again in Aug 2016 after GI bleeding  -Repeat a ferritin was 273 on 10/03/2015, much improved  -he received iv feraheme again on 11/27/2015, but anemia did not improve much. -His anemia has been getting worse lately, probably related to chemotherapy, we'll close monitor any signs of GI bleeding. -Blood transfusion if hemoglobin less than 8  11. hypomagnesemia  -secondary to panitumumab -he receives IV mag 6 g every 1-2 weeks -He is taking magnesium pill 2 tablets 3 times a day, we'll hold it for now due to his diarrhea -follow up closely.    Plan -hold chemo today -stool for C-diff and blood culture  -IVF 1L over 2-3 hours  -RTC in 2 days for IVF, blood transfusion [facility]  All questions were answered. The patient knows to call the clinic with any problems, questions or concerns.  I spent 25 minutes counseling the patient face to face. The total time spent in the appointment was 30 minutes and more than 50% was on counseling.     FTruitt Merle MD 12/18/2015

## 2015-12-18 NOTE — Patient Instructions (Signed)

## 2015-12-19 ENCOUNTER — Other Ambulatory Visit: Payer: Self-pay | Admitting: Nurse Practitioner

## 2015-12-19 ENCOUNTER — Encounter: Payer: Self-pay | Admitting: *Deleted

## 2015-12-19 DIAGNOSIS — C187 Malignant neoplasm of sigmoid colon: Secondary | ICD-10-CM

## 2015-12-20 ENCOUNTER — Telehealth: Payer: Self-pay | Admitting: *Deleted

## 2015-12-20 ENCOUNTER — Ambulatory Visit (HOSPITAL_BASED_OUTPATIENT_CLINIC_OR_DEPARTMENT_OTHER): Payer: PPO

## 2015-12-20 ENCOUNTER — Other Ambulatory Visit: Payer: Self-pay | Admitting: Nurse Practitioner

## 2015-12-20 ENCOUNTER — Encounter: Payer: Self-pay | Admitting: Nurse Practitioner

## 2015-12-20 ENCOUNTER — Ambulatory Visit (HOSPITAL_COMMUNITY)
Admission: RE | Admit: 2015-12-20 | Discharge: 2015-12-20 | Disposition: A | Discharge status: Home / Self Care | Payer: PPO | Source: Ambulatory Visit | Attending: Hematology | Admitting: Hematology

## 2015-12-20 ENCOUNTER — Other Ambulatory Visit (HOSPITAL_BASED_OUTPATIENT_CLINIC_OR_DEPARTMENT_OTHER): Payer: PPO

## 2015-12-20 ENCOUNTER — Ambulatory Visit (HOSPITAL_BASED_OUTPATIENT_CLINIC_OR_DEPARTMENT_OTHER): Payer: PPO | Admitting: Nurse Practitioner

## 2015-12-20 ENCOUNTER — Telehealth: Payer: Self-pay | Admitting: Nurse Practitioner

## 2015-12-20 VITALS — BP 111/58 | HR 62 | Temp 97.8°F | Resp 18 | Ht 65.0 in | Wt 134.8 lb

## 2015-12-20 VITALS — BP 126/65 | HR 57 | Temp 98.0°F | Resp 16

## 2015-12-20 DIAGNOSIS — D630 Anemia in neoplastic disease: Secondary | ICD-10-CM | POA: Diagnosis not present

## 2015-12-20 DIAGNOSIS — C7970 Secondary malignant neoplasm of unspecified adrenal gland: Secondary | ICD-10-CM | POA: Diagnosis not present

## 2015-12-20 DIAGNOSIS — C187 Malignant neoplasm of sigmoid colon: Secondary | ICD-10-CM

## 2015-12-20 DIAGNOSIS — E46 Unspecified protein-calorie malnutrition: Secondary | ICD-10-CM | POA: Diagnosis not present

## 2015-12-20 DIAGNOSIS — R21 Rash and other nonspecific skin eruption: Secondary | ICD-10-CM

## 2015-12-20 DIAGNOSIS — Z95828 Presence of other vascular implants and grafts: Secondary | ICD-10-CM

## 2015-12-20 DIAGNOSIS — C787 Secondary malignant neoplasm of liver and intrahepatic bile duct: Secondary | ICD-10-CM | POA: Diagnosis not present

## 2015-12-20 DIAGNOSIS — D649 Anemia, unspecified: Secondary | ICD-10-CM

## 2015-12-20 DIAGNOSIS — Z452 Encounter for adjustment and management of vascular access device: Secondary | ICD-10-CM

## 2015-12-20 DIAGNOSIS — E8809 Other disorders of plasma-protein metabolism, not elsewhere classified: Secondary | ICD-10-CM | POA: Insufficient documentation

## 2015-12-20 DIAGNOSIS — R770 Abnormality of albumin: Secondary | ICD-10-CM

## 2015-12-20 LAB — CBC WITH DIFFERENTIAL/PLATELET
BASO%: 0.6 % (ref 0.0–2.0)
BASOS ABS: 0 10*3/uL (ref 0.0–0.1)
EOS ABS: 0.1 10*3/uL (ref 0.0–0.5)
EOS%: 3.9 % (ref 0.0–7.0)
HEMATOCRIT: 22 % — AB (ref 38.4–49.9)
HEMOGLOBIN: 7.2 g/dL — AB (ref 13.0–17.1)
LYMPH#: 0.9 10*3/uL (ref 0.9–3.3)
LYMPH%: 26.4 % (ref 14.0–49.0)
MCH: 28.5 pg (ref 27.2–33.4)
MCHC: 32.7 g/dL (ref 32.0–36.0)
MCV: 87 fL (ref 79.3–98.0)
MONO#: 0.5 10*3/uL (ref 0.1–0.9)
MONO%: 14.6 % — ABNORMAL HIGH (ref 0.0–14.0)
NEUT%: 54.5 % (ref 39.0–75.0)
NEUTROS ABS: 1.9 10*3/uL (ref 1.5–6.5)
Platelets: 258 10*3/uL (ref 140–400)
RBC: 2.53 10*6/uL — ABNORMAL LOW (ref 4.20–5.82)
RDW: 16.2 % — AB (ref 11.0–14.6)
WBC: 3.6 10*3/uL — ABNORMAL LOW (ref 4.0–10.3)

## 2015-12-20 LAB — COMPREHENSIVE METABOLIC PANEL
ALBUMIN: 2.7 g/dL — AB (ref 3.5–5.0)
ALT: 23 U/L (ref 0–55)
AST: 25 U/L (ref 5–34)
Alkaline Phosphatase: 183 U/L — ABNORMAL HIGH (ref 40–150)
Anion Gap: 9 mEq/L (ref 3–11)
BUN: 5.4 mg/dL — AB (ref 7.0–26.0)
CALCIUM: 8.2 mg/dL — AB (ref 8.4–10.4)
CO2: 23 mEq/L (ref 22–29)
Chloride: 106 mEq/L (ref 98–109)
Creatinine: 0.7 mg/dL (ref 0.7–1.3)
EGFR: >90 mL/min/{1.73_m2} (ref >90)
GLUCOSE: 95 mg/dL (ref 70–140)
POTASSIUM: 3.8 meq/L (ref 3.5–5.1)
Sodium: 138 mEq/L (ref 136–145)
TOTAL PROTEIN: 6.5 g/dL (ref 6.4–8.3)
Total Bilirubin: 0.38 mg/dL (ref 0.20–1.20)

## 2015-12-20 LAB — PREPARE RBC (CROSSMATCH)

## 2015-12-20 MED ORDER — ACETAMINOPHEN 325 MG PO TABS
ORAL_TABLET | ORAL | Status: AC
  Filled 2015-12-20: qty 2

## 2015-12-20 MED ORDER — DIPHENHYDRAMINE HCL 25 MG PO CAPS
25.0000 mg | ORAL_CAPSULE | Freq: Once | ORAL | Status: AC
  Administered 2015-12-20: 25 mg via ORAL

## 2015-12-20 MED ORDER — DIPHENHYDRAMINE HCL 25 MG PO CAPS
ORAL_CAPSULE | ORAL | Status: AC
Start: 2015-12-20 — End: 2015-12-20
  Filled 2015-12-20: qty 1

## 2015-12-20 MED ORDER — HEPARIN SOD (PORK) LOCK FLUSH 100 UNIT/ML IV SOLN
500.0000 [IU] | Freq: Once | INTRAVENOUS | Status: DC
  Administered 2015-12-20: 500 [IU] via INTRAVENOUS
  Filled 2015-12-20: qty 5

## 2015-12-20 MED ORDER — ACETAMINOPHEN 325 MG PO TABS
650.0000 mg | ORAL_TABLET | Freq: Once | ORAL | Status: AC
  Administered 2015-12-20: 650 mg via ORAL

## 2015-12-20 MED ORDER — SODIUM CHLORIDE 0.9% FLUSH
10.0000 mL | INTRAVENOUS | Status: DC | PRN
  Administered 2015-12-20: 10 mL via INTRAVENOUS
  Filled 2015-12-20: qty 10

## 2015-12-20 NOTE — Progress Notes (Signed)
SYMPTOM MANAGEMENT CLINIC   HPI: Jason Reilly 70 y.o. 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  Currently undergoing panitumumab/irinotecan regimen.   Patient's panitumumab/irniotecan chemotherapy regimen was held on Monday, 12/18/2015 secondary to poor tolerance.  Patient continues to experience increased fatigue and some mild shortness of breath with any exertion.  He denies any active bleeding whatsoever.  He denies any dark or bloody stools.  Patient reports that all nausea/vomiting and diarrhea have completely resolved.  Patient last received Feraheme on 11/27/2015.  Blood counts obtained today reveal a hemoglobin down from 7.8-7.2.  Patient will receive 2 units packed red blood cells today.  Patient has plans to return on Monday, 12/25/2015 for labs, flush, follow up visit, and his next cycle of chemotherapy.  HPI  Review of Systems  Constitutional: Positive for malaise/fatigue. Negative for fever and chills.  Gastrointestinal: Negative for nausea, vomiting, abdominal pain, diarrhea, blood in stool and melena.  Skin: Positive for itching and rash.  All other systems reviewed and are negative.   Past Medical History  Diagnosis Date  . Tachycardia   . Abnormal EKG   . Hypertension   . Non-ischemic cardiomyopathy ([place])   . Hepatitis B   . H. pylori infection   . Gastric ulcer   . Metastatic colon cancer to liver ([place])   . Diabetes mellitus without complication ([place])     metformin    Past Surgical History  Procedure Laterality Date  . Nuclear stress test  03/03/2013    High risk - consistent with nonischemic cardiomyopathy  . Left and right heart catheterization with coronary angiogram N/A 03/29/2013    Procedure: LEFT AND RIGHT HEART CATHETERIZATION WITH CORONARY ANGIOGRAM;  Surgeon: Pixie Casino, MD;  Location: [facility] CATH LAB;  Service: Cardiovascular;  Laterality: N/A;  . Esophagogastroduodenoscopy N/A 02/08/2015    Procedure:  ESOPHAGOGASTRODUODENOSCOPY (EGD);  Surgeon: Ladene Artist, MD;  Location: [facility]OSCOPY;  Service: Endoscopy;  Laterality: N/A;  . Esophagogastroduodenoscopy (egd) with propofol N/A 08/15/2015    Procedure: ESOPHAGOGASTRODUODENOSCOPY (EGD) WITH PROPOFOL;  Surgeon: Ladene Artist, MD;  Location: [facility] ENDOSCOPY;  Service: Endoscopy;  Laterality: N/A;    has Hypertension; Abnormal nuclear cardiac imaging test; Acute systolic heart failure ([place]); Non-ischemic cardiomyopathy ([place]); Cancer of sigmoid colon ([place]); Acute renal failure ([place]); Hyponatremia; Weakness; Acute blood loss anemia; Leukocytosis; Chronic combined systolic and diastolic heart failure ([place]); Arterial hypotension; Gastric ulcer with hemorrhage but without obstruction; Upper GI bleed; Absolute anemia; Nausea with vomiting; Dehydration; Hyperphosphatemia; Rash; Anemia, iron deficiency; Acute colitis; Duodenal ulcer without hemorrhage, perforation, or obstruction; Esophageal varices ([place]); Gastric ulcer with hemorrhage; Hypotension; Sepsis ([place]); Acute on chronic systolic (congestive) heart failure ([place]); Leukopenia due to antineoplastic chemotherapy; Influenza A; Cancer of sigmoid ([place]); Diabetes mellitus, type 2 ([place]); Hypomagnesemia; 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    has No Known Allergies.    Medication List       This list is accurate as of: 12/20/15 10:13 AM.  Always use your most recent med list.               clindamycin 1 % gel  Commonly known as:  CLINDAGEL  Apply topically 2 (two) times daily.     clindamycin 1 % gel  Commonly known as:  CLINDAGEL  Apply topically 2 (two) times daily.     folic acid 1 MG tablet  Commonly known as:  FOLVITE  Take 1  tablet (1 mg total) by mouth daily.     lidocaine-prilocaine cream  Commonly known as:  EMLA  Apply 1 application topically as needed. Apply to [place] cath at least one hour before needle  stick.     loperamide 2 MG capsule  Commonly known as:  IMODIUM  Take 2 capsules (4 mg total) by mouth 4 (four) times daily as needed for diarrhea or loose stools (NO MORE THAN 8 TABLETS PER DAY.).     magic mouthwash Soln  Take 5 mLs by mouth 4 (four) times daily.     magnesium oxide 400 (241.3 Mg) MG tablet  Commonly known as:  MAG-OX  Take 1 tablet (400 mg total) by mouth 3 (three) times daily.     metFORMIN 500 MG tablet  Commonly known as:  GLUCOPHAGE  Take 1 tablet (500 mg total) by mouth 2 (two) times daily with a meal. THIS IS A ONE TIME ORDER.  FUTURE REFILLS NEED TO BE DONE BY PRIMARY MD.     nadolol 20 MG tablet  Commonly known as:  CORGARD  Take 1 tablet (20 mg total) by mouth 2 (two) times daily. THIS IS A ONE TIME ORDER.  FUTURE REFILLS NEED TO BE DONE BY PRIMARY MD.     ondansetron 8 MG tablet  Commonly known as:  ZOFRAN  Take 1 tablet (8 mg total) by mouth every 8 (eight) hours as needed for nausea or vomiting.     oseltamivir 75 MG capsule  Commonly known as:  TAMIFLU  Take 1 capsule (75 mg total) by mouth 2 (two) times daily.     pantoprazole 40 MG tablet  Commonly known as:  PROTONIX  Take 1 tablet (40 mg total) by mouth daily.     prochlorperazine 10 MG tablet  Commonly known as:  COMPAZINE  Take 1 tablet (10 mg total) by mouth every 6 (six) hours as needed for nausea or vomiting.         PHYSICAL EXAMINATION  Oncology Vitals 12/20/2015 12/18/2015  Height 165 cm -  Weight 61.145 kg -  Weight (lbs) 134 lbs 13 oz -  BMI (kg/m2) 22.43 kg/m2 -  Temp 97.8 -  Pulse 62 56  Resp 18 -  SpO2 100 -  BSA (m2) 1.67 m2 -   BP Readings from Last 2 Encounters:  12/20/15 111/58  12/18/15 107/53    Physical Exam  Constitutional: He is oriented to person, place, and time. Vital signs are normal. He appears malnourished. He appears unhealthy. He appears cachectic.  HENT:  Head: Normocephalic and atraumatic.  Mouth/Throat: Oropharynx is clear and moist.    Eyes: Conjunctivae and EOM are normal. Pupils are equal, round, and reactive to light. Right eye exhibits no discharge. Left eye exhibits no discharge. No scleral icterus.  Neck: Normal range of motion.  Pulmonary/Chest: Effort normal. No respiratory distress.  Musculoskeletal: Normal range of motion.  Neurological: He is alert and oriented to person, place, and time. Gait normal.  Skin: Skin is warm and dry. There is pallor.  Psychiatric: Affect normal.    LABORATORY DATA:. Appointment on 12/20/2015  Component Date Value Ref Range Status  . WBC 12/20/2015 3.6* 4.0 - 10.3 10e3/uL Final  . NEUT# 12/20/2015 1.9  1.5 - 6.5 10e3/uL Final  . HGB 12/20/2015 7.2* 13.0 - 17.1 g/dL Final  . HCT 12/20/2015 22.0* 38.4 - 49.9 % Final  . Platelets 12/20/2015 258  140 - 400 10e3/uL Final  . MCV 12/20/2015 87.0  79.3 -   98.0 fL Final  . MCH 12/20/2015 28.5  27.2 - 33.4 pg Final  . MCHC 12/20/2015 32.7  32.0 - 36.0 g/dL Final  . RBC 12/20/2015 2.53* 4.20 - 5.82 10e6/uL Final  . RDW 12/20/2015 16.2* 11.0 - 14.6 % Final  . lymph# 12/20/2015 0.9  0.9 - 3.3 10e3/uL Final  . MONO# 12/20/2015 0.5  0.1 - 0.9 10e3/uL Final  . Eosinophils Absolute 12/20/2015 0.1  0.0 - 0.5 10e3/uL Final  . Basophils Absolute 12/20/2015 0.0  0.0 - 0.1 10e3/uL Final  . NEUT% 12/20/2015 54.5  39.0 - 75.0 % Final  . LYMPH% 12/20/2015 26.4  14.0 - 49.0 % Final  . MONO% 12/20/2015 14.6* 0.0 - 14.0 % Final  . EOS% 12/20/2015 3.9  0.0 - 7.0 % Final  . BASO% 12/20/2015 0.6  0.0 - 2.0 % Final  . Sodium 12/20/2015 138  136 - 145 mEq/L Final  . Potassium 12/20/2015 3.8  3.5 - 5.1 mEq/L Final  . Chloride 12/20/2015 106  98 - 109 mEq/L Final  . CO2 12/20/2015 23  22 - 29 mEq/L Final  . Glucose 12/20/2015 95  70 - 140 mg/dl Final   Glucose reference range is for nonfasting patients. Fasting glucose reference range is 70- 100.  . BUN 12/20/2015 5.4* 7.0 - 26.0 mg/dL Final  . Creatinine 12/20/2015 0.7  0.7 - 1.3 mg/dL Final  . Total  Bilirubin 12/20/2015 0.38  0.20 - 1.20 mg/dL Final  . Alkaline Phosphatase 12/20/2015 183* 40 - 150 U/L Final  . AST 12/20/2015 25  5 - 34 U/L Final  . ALT 12/20/2015 23  0 - 55 U/L Final  . Total Protein 12/20/2015 6.5  6.4 - 8.3 g/dL Final  . Albumin 12/20/2015 2.7* 3.5 - 5.0 g/dL Final  . Calcium 12/20/2015 8.2* 8.4 - 10.4 mg/dL Final  . Anion Gap 12/20/2015 9  3 - 11 mEq/L Final  . EGFR 12/20/2015 >90  >90 ml/min/1.73 m2 Final   eGFR is calculated using the CKD-EPI Creatinine Equation (2009)  Appointment on 12/18/2015  Component Date Value Ref Range Status  . WBC 12/18/2015 3.9* 4.0 - 10.3 10e3/uL Final  . NEUT# 12/18/2015 2.0  1.5 - 6.5 10e3/uL Final  . HGB 12/18/2015 7.8* 13.0 - 17.1 g/dL Final  . HCT 12/18/2015 22.7* 38.4 - 49.9 % Final  . Platelets 12/18/2015 265  140 - 400 10e3/uL Final  . MCV 12/18/2015 85.7  79.3 - 98.0 fL Final  . MCH 12/18/2015 29.4  27.2 - 33.4 pg Final  . MCHC 12/18/2015 34.4  32.0 - 36.0 g/dL Final  . RBC 12/18/2015 2.65* 4.20 - 5.82 10e6/uL Final  . RDW 12/18/2015 16.1* 11.0 - 14.6 % Final  . lymph# 12/18/2015 0.9  0.9 - 3.3 10e3/uL Final  . MONO# 12/18/2015 0.8  0.1 - 0.9 10e3/uL Final  . Eosinophils Absolute 12/18/2015 0.1  0.0 - 0.5 10e3/uL Final  . Basophils Absolute 12/18/2015 0.0  0.0 - 0.1 10e3/uL Final  . NEUT% 12/18/2015 52.4  39.0 - 75.0 % Final  . LYMPH% 12/18/2015 23.4  14.0 - 49.0 % Final  . MONO% 12/18/2015 20.0* 0.0 - 14.0 % Final  . EOS% 12/18/2015 3.4  0.0 - 7.0 % Final  . BASO% 12/18/2015 0.8  0.0 - 2.0 % Final  . Magnesium 12/18/2015 0.7* 1.5 - 2.5 mg/dl Final  . Sodium 12/18/2015 135* 136 - 145 mEq/L Final  . Potassium 12/18/2015 4.0  3.5 - 5.1 mEq/L Final  . Chloride 12/18/2015 103    98 - 109 mEq/L Final  . CO2 12/18/2015 23  22 - 29 mEq/L Final  . Glucose 12/18/2015 114  70 - 140 mg/dl Final   Glucose reference range is for nonfasting patients. Fasting glucose reference range is 70- 100.  Marland Kitchen BUN 12/18/2015 12.8  7.0 - 26.0  mg/dL Final  . Creatinine 12/18/2015 0.8  0.7 - 1.3 mg/dL Final  . Total Bilirubin 12/18/2015 0.65  0.20 - 1.20 mg/dL Final  . Alkaline Phosphatase 12/18/2015 238* 40 - 150 U/L Final  . AST 12/18/2015 32  5 - 34 U/L Final  . ALT 12/18/2015 27  0 - 55 U/L Final  . Total Protein 12/18/2015 6.9  6.4 - 8.3 g/dL Final  . Albumin 12/18/2015 2.8* 3.5 - 5.0 g/dL Final  . Calcium 12/18/2015 7.7* 8.4 - 10.4 mg/dL Final  . Anion Gap 12/18/2015 9  3 - 11 mEq/L Final  . EGFR 12/18/2015 >90  >90 ml/min/1.73 m2 Final   eGFR is calculated using the CKD-EPI Creatinine Equation (2009)  . BLOOD CULTURE, ROUTINE 12/18/2015 Preliminary report   Preliminary  . RESULT 1 12/18/2015 Comment   Preliminary   No growth detected at this time.     RADIOGRAPHIC STUDIES: No results found.  ASSESSMENT/PLAN:    Rash Patient's immunotherapy was held last week due to poor tolerance.  Patient states that his rash-most likely secondary to his immunotherapy infusions-has greatly improved within this past week.  Exam today reveals resolving scattered rash to patient's back and neck.  Patient states that the pruritus has greatly improved as well.  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  Patient was encouraged to push protein in his diet is much as possible.  Cancer of sigmoid colon ([place]) Patient's panitumumab/irniotecan chemotherapy regimen was held on Monday, 12/18/2015 secondary to poor tolerance.  Patient continues to experience increased fatigue and some mild shortness of breath with any exertion.  He denies any active bleeding whatsoever.  He denies any dark or bloody stools.  Patient reports that all nausea/vomiting and diarrhea have completely resolved.  Patient last received Feraheme on 11/27/2015.  Blood counts obtained today reveal a hemoglobin down from 7.8-7.2.  Patient will receive 2 units packed red blood cells today.  Patient has plans to return on Monday,  12/25/2015 for labs, flush, follow up visit, and his next cycle of chemotherapy.  Anemia in neoplastic disease Hemoglobin has further decreased from 7.8 down to 7.2.  Patient will receive 2 units packed red blood cells today.  Patient has a history of a chronic GI bleed in the past; but continues to deny any active bleeding or blood in his stools at this time.  Will continue to monitor closely.  Patient stated understanding of all instructions; and was in agreement with this plan of care. The patient knows to call the clinic with any problems, questions or concerns.   Review/collaboration with [name]  regarding all aspects of patient's visit today.   Total time spent with patient was 25 minutes;  with greater than 75 percent of that time spent in face to face counseling regarding patient's symptoms,  and coordination of care and follow up.  Disclaimer:This dictation was prepared with Dragon/digital dictation along with [employer]. Any transcriptional errors that result from this process are unintentional.  Drue Second, NP 12/20/2015

## 2015-12-20 NOTE — Telephone Encounter (Signed)
Per staff message and POF I have scheduled appts. Advised scheduler of appts. JMW  

## 2015-12-20 NOTE — Assessment & Plan Note (Signed)
Patient's immunotherapy was held last week due to poor tolerance.  Patient states that his rash-most likely secondary to his immunotherapy infusions-has greatly improved within this past week.  Exam today reveals resolving scattered rash to patient's back and neck.  Patient states that the pruritus has greatly improved as well.

## 2015-12-20 NOTE — Telephone Encounter (Signed)
Advise scheduoer to move labs

## 2015-12-20 NOTE — Assessment & Plan Note (Signed)
Albumin remains low at 2.7.  Patient was encouraged to push protein in his diet is much as possible.

## 2015-12-20 NOTE — Assessment & Plan Note (Signed)
Hemoglobin has further decreased from 7.8 down to 7.2.  Patient will receive 2 units packed red blood cells today.  Patient has a history of a chronic GI bleed in the past; but continues to deny any active bleeding or blood in his stools at this time.  Will continue to monitor closely.

## 2015-12-20 NOTE — Assessment & Plan Note (Signed)
Patient's panitumumab/irniotecan chemotherapy regimen was held on Monday, 12/18/2015 secondary to poor tolerance.  Patient continues to experience increased fatigue and some mild shortness of breath with any exertion.  He denies any active bleeding whatsoever.  He denies any dark or bloody stools.  Patient reports that all nausea/vomiting and diarrhea have completely resolved.  Patient last received Feraheme on 11/27/2015.  Blood counts obtained today reveal a hemoglobin down from 7.8-7.2.  Patient will receive 2 units packed red blood cells today.  Patient has plans to return on Monday, 12/25/2015 for labs, flush, follow up visit, and his next cycle of chemotherapy.

## 2015-12-20 NOTE — Telephone Encounter (Signed)
per pof to sch pt appt-sent MW emailto sch trmt-pt to getupdated copy b4 leaving** °

## 2015-12-21 ENCOUNTER — Other Ambulatory Visit: Payer: Self-pay

## 2015-12-21 LAB — TYPE AND SCREEN
ABO/RH(D): O POS
Antibody Screen: NEGATIVE
UNIT DIVISION: 0
Unit division: 0

## 2015-12-21 NOTE — Patient Outreach (Addendum)
[facility]) Care Management  12/21/2015  Jason Reilly Mar 16, 1946 [id number]  Telephone call to patient regarding [facility] referral.  HIPAA verified with patient. Discussed and offered [facility] care management services.  Patient confirmed he had diabetes and is on oral medication.  Patient states he is cleaning right now.  Patient request call back another time.   PLAN:  RNCM will attempt 2nd telephone call within 1 week.  Quinn Plowman RN,BSN,CCM [facility] Telephonic  [phone]

## 2015-12-22 ENCOUNTER — Other Ambulatory Visit: Payer: Self-pay | Admitting: *Deleted

## 2015-12-22 DIAGNOSIS — C187 Malignant neoplasm of sigmoid colon: Secondary | ICD-10-CM

## 2015-12-23 ENCOUNTER — Encounter (HOSPITAL_COMMUNITY): Payer: Self-pay | Admitting: Emergency Medicine

## 2015-12-23 ENCOUNTER — Emergency Department (HOSPITAL_COMMUNITY)
Admission: EM | Admit: 2015-12-23 | Discharge: 2015-12-23 | Disposition: A | Discharge status: Home / Self Care | Payer: PPO | Attending: Emergency Medicine | Admitting: Emergency Medicine

## 2015-12-23 DIAGNOSIS — Z8719 Personal history of other diseases of the digestive system: Secondary | ICD-10-CM | POA: Insufficient documentation

## 2015-12-23 DIAGNOSIS — I10 Essential (primary) hypertension: Secondary | ICD-10-CM | POA: Diagnosis not present

## 2015-12-23 DIAGNOSIS — Z79899 Other long term (current) drug therapy: Secondary | ICD-10-CM | POA: Insufficient documentation

## 2015-12-23 DIAGNOSIS — Z7984 Long term (current) use of oral hypoglycemic drugs: Secondary | ICD-10-CM | POA: Diagnosis not present

## 2015-12-23 DIAGNOSIS — E119 Type 2 diabetes mellitus without complications: Secondary | ICD-10-CM | POA: Diagnosis not present

## 2015-12-23 DIAGNOSIS — Z87891 Personal history of nicotine dependence: Secondary | ICD-10-CM | POA: Diagnosis not present

## 2015-12-23 DIAGNOSIS — Z792 Long term (current) use of antibiotics: Secondary | ICD-10-CM | POA: Insufficient documentation

## 2015-12-23 DIAGNOSIS — R2243 Localized swelling, mass and lump, lower limb, bilateral: Secondary | ICD-10-CM | POA: Diagnosis not present

## 2015-12-23 DIAGNOSIS — Z85038 Personal history of other malignant neoplasm of large intestine: Secondary | ICD-10-CM | POA: Insufficient documentation

## 2015-12-23 DIAGNOSIS — Z8619 Personal history of other infectious and parasitic diseases: Secondary | ICD-10-CM | POA: Insufficient documentation

## 2015-12-23 DIAGNOSIS — Z8505 Personal history of malignant neoplasm of liver: Secondary | ICD-10-CM | POA: Diagnosis not present

## 2015-12-23 DIAGNOSIS — R600 Localized edema: Secondary | ICD-10-CM

## 2015-12-23 MED ORDER — HYDROCORTISONE 1 % EX OINT: 1.0000 "application " | TOPICAL_OINTMENT | Freq: Two times a day (BID) | CUTANEOUS | Status: AC

## 2015-12-23 NOTE — ED Provider Notes (Signed)
CSN: [id number]     Arrival date & time 12/23/15  1042 History   First MD Initiated Contact with Patient 12/23/15 1147     Chief Complaint  Patient presents with  . ankle swelling      (Consider location/radiation/quality/duration/timing/severity/associated sxs/prior Treatment) HPI..... Level V caveat for language barrier. History obtained from daughter. Patient complains of bilateral lower extremity swelling. No chest pain or dyspnea. He is currently being treated for metastatic colon cancer. No fever, sweats, chills. He is ambulatory without dyspnea. Severity of symptoms is mild.  Past Medical History  Diagnosis Date  . Tachycardia   . Abnormal EKG   . Hypertension   . Non-ischemic cardiomyopathy ([place])   . Hepatitis B   . H. pylori infection   . Gastric ulcer   . Metastatic colon cancer to liver ([place])   . Diabetes mellitus without complication ([place])     metformin   Past Surgical History  Procedure Laterality Date  . Nuclear stress test  03/03/2013    High risk - consistent with nonischemic cardiomyopathy  . Left and right heart catheterization with coronary angiogram N/A 03/29/2013    Procedure: LEFT AND RIGHT HEART CATHETERIZATION WITH CORONARY ANGIOGRAM;  Surgeon: Pixie Casino, MD;  Location: [facility] CATH LAB;  Service: Cardiovascular;  Laterality: N/A;  . Esophagogastroduodenoscopy N/A 02/08/2015    Procedure: ESOPHAGOGASTRODUODENOSCOPY (EGD);  Surgeon: Ladene Artist, MD;  Location: [facility] ENDOSCOPY;  Service: Endoscopy;  Laterality: N/A;  . Esophagogastroduodenoscopy (egd) with propofol N/A 08/15/2015    Procedure: ESOPHAGOGASTRODUODENOSCOPY (EGD) WITH PROPOFOL;  Surgeon: Ladene Artist, MD;  Location: [facility] ENDOSCOPY;  Service: Endoscopy;  Laterality: N/A;   History reviewed. No pertinent family history. Social History  Substance Use Topics  . Smoking status: Former Smoker    Types: Cigarettes    Quit date: 11/07/2008  . Smokeless tobacco: Never Used  . Alcohol Use: Yes   Comment: occasional    Review of Systems  Reason unable to perform ROS: Language barrier.      Allergies  Review of patient's allergies indicates no known allergies.  Home Medications   Prior to Admission medications   Medication Sig Start Date End Date Taking? Authorizing Provider  clindamycin (CLINDAGEL) 1 % gel Apply topically 2 (two) times daily. 09/19/15   Truitt Merle, MD  clindamycin (CLINDAGEL) 1 % gel Apply topically 2 (two) times daily. 11/20/15   Truitt Merle, MD  folic acid (FOLVITE) 1 MG tablet Take 1 tablet (1 mg total) by mouth daily. 11/20/15   Truitt Merle, MD  hydrocortisone 1 % ointment Apply 1 application topically 2 (two) times daily. 12/23/15   Nat Christen, MD  lidocaine-prilocaine (EMLA) cream Apply 1 application topically as needed. Apply to [facility] cath at least one hour before needle stick. 11/20/15   Truitt Merle, MD  loperamide (IMODIUM) 2 MG capsule Take 2 capsules (4 mg total) by mouth 4 (four) times daily as needed for diarrhea or loose stools (NO MORE THAN 8 TABLETS PER DAY.). 11/20/15   Truitt Merle, MD  magic mouthwash SOLN Take 5 mLs by mouth 4 (four) times daily. Patient taking differently: Take 5 mLs by mouth 4 (four) times daily as needed.  10/31/15   Rondel Jumbo, PA-C  magnesium oxide (MAG-OX) 400 (241.3 Mg) MG tablet Take 1 tablet (400 mg total) by mouth 3 (three) times daily. 11/20/15   Truitt Merle, MD  metFORMIN (GLUCOPHAGE) 500 MG tablet Take 1 tablet (500 mg total) by mouth 2 (two) times daily with  a meal. THIS IS A ONE TIME ORDER.  FUTURE REFILLS NEED TO BE DONE BY PRIMARY MD. 10/17/15   Truitt Merle, MD  nadolol (CORGARD) 20 MG tablet Take 1 tablet (20 mg total) by mouth 2 (two) times daily. THIS IS A ONE TIME ORDER.  FUTURE REFILLS NEED TO BE DONE BY PRIMARY MD. 10/17/15   Truitt Merle, MD  ondansetron (ZOFRAN) 8 MG tablet Take 1 tablet (8 mg total) by mouth every 8 (eight) hours as needed for nausea or vomiting. 10/17/15   Truitt Merle, MD  oseltamivir (TAMIFLU) 75 MG capsule Take 1  capsule (75 mg total) by mouth 2 (two) times daily. 11/10/15   Costin Karlyne Greenspan, MD  pantoprazole (PROTONIX) 40 MG tablet Take 1 tablet (40 mg total) by mouth daily. 12/18/15   Truitt Merle, MD  prochlorperazine (COMPAZINE) 10 MG tablet Take 1 tablet (10 mg total) by mouth every 6 (six) hours as needed for nausea or vomiting. 10/31/15   Coralee Pesa Wertman, PA-C   BP 119/68 mmHg  Pulse 65  Temp(Src) 97.5 F (36.4 C) (Oral)  Resp 18  SpO2 98% Physical Exam  Constitutional: He is oriented to person, place, and time.  Pleasant, no acute distress  HENT:  Head: Normocephalic and atraumatic.  Eyes: Conjunctivae and EOM are normal. Pupils are equal, round, and reactive to light.  Neck: Normal range of motion. Neck supple.  Cardiovascular: Normal rate and regular rhythm.   Pulmonary/Chest: Effort normal and breath sounds normal.  Abdominal: Soft. Bowel sounds are normal.  Musculoskeletal: Normal range of motion.  Neurological: He is alert and oriented to person, place, and time.  Skin:  2+ nonpitting edema in bilateral lower extremities with associated papular excoriation  Psychiatric: He has a normal mood and affect. His behavior is normal.  Nursing note and vitals reviewed.   ED Course  Procedures (including critical care time) Labs Review Labs Reviewed - No data to display  Imaging Review No results found. I have personally reviewed and evaluated these images and lab results as part of my medical decision-making.   EKG Interpretation None      MDM   Final diagnoses:  Bilateral edema of lower extremity    Patient's lower extremity edema is moderate. I suspect there is component of malnutrition leading to hypoalbuminemia. I assured the patient and his daughter that this was not a critical problem. Will prescribe 1% hydrocortisone ointment to the eczematous rash of his lower extremity    Nat Christen, MD 12/23/15 1302

## 2015-12-23 NOTE — Discharge Instructions (Signed)
This is not a life-threatening condition. Elevate legs. Support stockings. Decrease salt in your diet. Prescription ointment.

## 2015-12-23 NOTE — ED Notes (Signed)
Pt reports bilateral ankle/foot swelling. Pt is being treated for colon cancer. Had a blood transfusion this week, but last chemo was 2 weeks ago.

## 2015-12-24 LAB — CULTURE, BLOOD (SINGLE)

## 2015-12-25 ENCOUNTER — Ambulatory Visit: Payer: PPO | Admitting: Nurse Practitioner

## 2015-12-25 ENCOUNTER — Ambulatory Visit (HOSPITAL_BASED_OUTPATIENT_CLINIC_OR_DEPARTMENT_OTHER): Payer: PPO

## 2015-12-25 ENCOUNTER — Ambulatory Visit (HOSPITAL_BASED_OUTPATIENT_CLINIC_OR_DEPARTMENT_OTHER): Payer: PPO | Admitting: Nurse Practitioner

## 2015-12-25 ENCOUNTER — Other Ambulatory Visit (HOSPITAL_BASED_OUTPATIENT_CLINIC_OR_DEPARTMENT_OTHER): Payer: PPO

## 2015-12-25 ENCOUNTER — Ambulatory Visit: Payer: PPO

## 2015-12-25 VITALS — BP 126/69 | HR 65 | Temp 96.7°F | Resp 17 | Ht 65.0 in | Wt 140.4 lb

## 2015-12-25 DIAGNOSIS — R21 Rash and other nonspecific skin eruption: Secondary | ICD-10-CM | POA: Diagnosis not present

## 2015-12-25 DIAGNOSIS — C187 Malignant neoplasm of sigmoid colon: Secondary | ICD-10-CM

## 2015-12-25 DIAGNOSIS — E46 Unspecified protein-calorie malnutrition: Secondary | ICD-10-CM

## 2015-12-25 DIAGNOSIS — Z5111 Encounter for antineoplastic chemotherapy: Secondary | ICD-10-CM

## 2015-12-25 DIAGNOSIS — C787 Secondary malignant neoplasm of liver and intrahepatic bile duct: Secondary | ICD-10-CM

## 2015-12-25 DIAGNOSIS — R770 Abnormality of albumin: Secondary | ICD-10-CM

## 2015-12-25 DIAGNOSIS — Z5112 Encounter for antineoplastic immunotherapy: Secondary | ICD-10-CM | POA: Diagnosis not present

## 2015-12-25 DIAGNOSIS — E8809 Other disorders of plasma-protein metabolism, not elsewhere classified: Secondary | ICD-10-CM

## 2015-12-25 DIAGNOSIS — C7970 Secondary malignant neoplasm of unspecified adrenal gland: Secondary | ICD-10-CM

## 2015-12-25 LAB — COMPREHENSIVE METABOLIC PANEL
ALBUMIN: 2.9 g/dL — AB (ref 3.5–5.0)
ALK PHOS: 160 U/L — AB (ref 40–150)
ALT: 23 U/L (ref 0–55)
ANION GAP: 8 meq/L (ref 3–11)
AST: 32 U/L (ref 5–34)
BILIRUBIN TOTAL: 0.39 mg/dL (ref 0.20–1.20)
BUN: 8.5 mg/dL (ref 7.0–26.0)
CO2: 27 meq/L (ref 22–29)
Calcium: 7.7 mg/dL — ABNORMAL LOW (ref 8.4–10.4)
Chloride: 103 mEq/L (ref 98–109)
Creatinine: 0.7 mg/dL (ref 0.7–1.3)
Glucose: 107 mg/dl (ref 70–140)
Potassium: 4.5 mEq/L (ref 3.5–5.1)
Sodium: 138 mEq/L (ref 136–145)
TOTAL PROTEIN: 7.1 g/dL (ref 6.4–8.3)

## 2015-12-25 LAB — CBC WITH DIFFERENTIAL/PLATELET
BASO%: 0.4 % (ref 0.0–2.0)
Basophils Absolute: 0 10*3/uL (ref 0.0–0.1)
EOS%: 4.3 % (ref 0.0–7.0)
Eosinophils Absolute: 0.3 10*3/uL (ref 0.0–0.5)
HEMATOCRIT: 31.3 % — AB (ref 38.4–49.9)
HEMOGLOBIN: 10.4 g/dL — AB (ref 13.0–17.1)
LYMPH#: 0.9 10*3/uL (ref 0.9–3.3)
LYMPH%: 13.4 % — ABNORMAL LOW (ref 14.0–49.0)
MCH: 29.4 pg (ref 27.2–33.4)
MCHC: 33.2 g/dL (ref 32.0–36.0)
MCV: 88.7 fL (ref 79.3–98.0)
MONO#: 0.6 10*3/uL (ref 0.1–0.9)
MONO%: 10.2 % (ref 0.0–14.0)
NEUT#: 4.6 10*3/uL (ref 1.5–6.5)
NEUT%: 71.7 % (ref 39.0–75.0)
PLATELETS: 207 10*3/uL (ref 140–400)
RBC: 3.53 10*6/uL — AB (ref 4.20–5.82)
RDW: 17.2 % — AB (ref 11.0–14.6)
WBC: 6.4 10*3/uL (ref 4.0–10.3)

## 2015-12-25 LAB — MAGNESIUM: Magnesium: 0.7 mg/dl — CL (ref 1.5–2.5)

## 2015-12-25 MED ORDER — SODIUM CHLORIDE 0.9 % IV SOLN
Freq: Once | INTRAVENOUS | Status: AC
  Administered 2015-12-25: 14:00:00 via INTRAVENOUS
  Filled 2015-12-25: qty 4

## 2015-12-25 MED ORDER — SODIUM CHLORIDE 0.9 % IV SOLN
6.0000 g | Freq: Once | INTRAVENOUS | Status: AC
  Administered 2015-12-25: 6 g via INTRAVENOUS
  Filled 2015-12-25: qty 12

## 2015-12-25 MED ORDER — PANITUMUMAB CHEMO INJECTION 100 MG/5ML
6.2000 mg/kg | Freq: Once | INTRAVENOUS | Status: AC
  Administered 2015-12-25: 400 mg via INTRAVENOUS
  Filled 2015-12-25: qty 20

## 2015-12-25 MED ORDER — SODIUM CHLORIDE 0.9 % IV SOLN
Freq: Once | INTRAVENOUS | Status: AC
  Administered 2015-12-25: 11:00:00 via INTRAVENOUS

## 2015-12-25 MED ORDER — ATROPINE SULFATE 1 MG/ML IJ SOLN
0.5000 mg | Freq: Once | INTRAMUSCULAR | Status: AC | PRN
  Administered 2015-12-25: 0.5 mg via INTRAVENOUS

## 2015-12-25 MED ORDER — MAGNESIUM SULFATE 50 % IJ SOLN
4.0000 g | Freq: Once | INTRAMUSCULAR | Status: DC
  Filled 2015-12-25: qty 8

## 2015-12-25 MED ORDER — SODIUM CHLORIDE 0.9% FLUSH
10.0000 mL | INTRAVENOUS | Status: DC | PRN
Start: 2015-12-25 — End: 2015-12-25
  Administered 2015-12-25: 10 mL via INTRAVENOUS
  Filled 2015-12-25: qty 10

## 2015-12-25 MED ORDER — SODIUM CHLORIDE 0.9 % IJ SOLN
10.0000 mL | INTRAMUSCULAR | Status: DC | PRN
  Administered 2015-12-25: 10 mL
  Filled 2015-12-25: qty 10

## 2015-12-25 MED ORDER — HEPARIN SOD (PORK) LOCK FLUSH 100 UNIT/ML IV SOLN
500.0000 [IU] | Freq: Once | INTRAVENOUS | Status: AC | PRN
  Administered 2015-12-25: 500 [IU]
  Filled 2015-12-25: qty 5

## 2015-12-25 MED ORDER — IRINOTECAN HCL CHEMO INJECTION 100 MG/5ML
160.0000 mg/m2 | Freq: Once | INTRAVENOUS | Status: AC
  Administered 2015-12-25: 272 mg via INTRAVENOUS
  Filled 2015-12-25: qty 10

## 2015-12-25 MED ORDER — ATROPINE SULFATE 1 MG/ML IJ SOLN
INTRAMUSCULAR | Status: AC
  Filled 2015-12-25: qty 1

## 2015-12-25 NOTE — Patient Instructions (Signed)
Hypomagnesemia Hypomagnesemia is a condition in which the level of magnesium in the blood is low. Magnesium is a mineral that is found in many foods. It is used in many different processes in the body. Hypomagnesemia can affect every organ in the body. It can cause life-threatening problems. CAUSES Causes of hypomagnesemia include:  Not getting enough magnesium in your diet.  Malnutrition.  Problems with absorbing magnesium from the intestines.  Dehydration.  Alcohol abuse.  Vomiting.  Severe diarrhea.  Some medicines, including medicines that make you urinate more.  Certain diseases, such as kidney disease, diabetes, and overactive thyroid. SIGNS AND SYMPTOMS  Involuntary shaking or trembling of a body part (tremor).  Confusion.  Muscle weakness.  Sensitivity to light, sound, and touch.  Psychiatric issues, such as depression, irritability, or psychosis.  Sudden tightening of muscles (muscle spasms).  Tingling in the arms and legs.  A feeling of fluttering of the heart. These symptoms are more severe if magnesium levels drop suddenly. DIAGNOSIS To make a diagnosis, your health care provider will do a physical exam and order blood and urine tests. TREATMENT Treatment will depend on the cause and the severity of your condition. It may involve:  A magnesium supplement. This can be taken in pill form. It can also be given through an IV tube. This is usually done if the condition is severe.  Changes to your diet. You may be directed to eat foods that have a lot of magnesium, such as green leafy vegetables, peas, beans, and nuts.  Eliminating alcohol from your diet. HOME CARE INSTRUCTIONS  Include foods with magnesium in your diet. Foods that are rich in magnesium include green vegetables, beans, nuts and seeds, and whole grains.  Take medicines only as directed by your health care provider.  Take magnesium supplements if your health care provider instructs you to  do that. Take them as directed.  Have your magnesium levels monitored as directed by your health care provider.  When you are active, drink fluids that contain electrolytes.  Keep all follow-up visits as directed by your health care provider. This is important. SEEK MEDICAL CARE IF:  You get worse instead of better.  Your symptoms return. SEEK IMMEDIATE MEDICAL CARE IF:  Your symptoms are severe.   This information is not intended to replace advice given to you by your health care provider. Make sure you discuss any questions you have with your health care provider.   Document Released: 07/10/2005 Document Revised: 11/04/2014 Document Reviewed: 05/30/2014 Elsevier Interactive Patient Education 2016 [place] What is this medicine? FERUMOXYTOL is an iron complex. Iron is used to make healthy red blood cells, which carry oxygen and nutrients throughout the body. This medicine is used to treat iron deficiency anemia in people with chronic kidney disease. This medicine may be used for other purposes; ask your health care provider or pharmacist if you have questions. What should I tell my health care provider before I take this medicine? They need to know if you have any of these conditions: -anemia not caused by low iron levels -high levels of iron in the blood -magnetic resonance imaging (MRI) test scheduled -an unusual or allergic reaction to iron, other medicines, foods, dyes, or preservatives -pregnant or trying to get pregnant -breast-feeding How should I use this medicine? This medicine is for injection into a vein. It is given by a health care professional in a hospital or clinic setting. Talk to your pediatrician regarding the use  of this medicine in children. Special care may be needed. Overdosage: If you think you have taken too much of this medicine contact a poison control center or emergency room at once. NOTE: This medicine is only for  you. Do not share this medicine with others. What if I miss a dose? It is important not to miss your dose. Call your doctor or health care professional if you are unable to keep an appointment. What may interact with this medicine? This medicine may interact with the following medications: -other iron products This list may not describe all possible interactions. Give your health care provider a list of all the medicines, herbs, non-prescription drugs, or dietary supplements you use. Also tell them if you smoke, drink alcohol, or use illegal drugs. Some items may interact with your medicine. What should I watch for while using this medicine? Visit your doctor or healthcare professional regularly. Tell your doctor or healthcare professional if your symptoms do not start to get better or if they get worse. You may need blood work done while you are taking this medicine. You may need to follow a special diet. Talk to your doctor. Foods that contain iron include: whole grains/cereals, dried fruits, beans, or peas, leafy green vegetables, and organ meats (liver, kidney). What side effects may I notice from receiving this medicine? Side effects that you should report to your doctor or health care professional as soon as possible: -allergic reactions like skin rash, itching or hives, swelling of the face, lips, or tongue -breathing problems -changes in blood pressure -feeling faint or lightheaded, falls -fever or chills -flushing, sweating, or hot feelings -swelling of the ankles or feet Side effects that usually do not require medical attention (Report these to your doctor or health care professional if they continue or are bothersome.): -diarrhea -headache -nausea, vomiting -stomach pain This list may not describe all possible side effects. Call your doctor for medical advice about side effects. You may report side effects to FDA at 1-800-FDA-1088. Where should I keep my medicine? This drug is  given in a hospital or clinic and will not be stored at home. NOTE: This sheet is a summary. It may not cover all possible information. If you have questions about this medicine, talk to your doctor, pharmacist, or health care provider.    2016, Elsevier/Gold Standard. (2012-05-29 15:23:36)  [facility] Discharge Instructions for Patients Receiving Chemotherapy  Today you received the following chemotherapy agents Irinotecan/Vectibix/Magnesium.  To help prevent nausea and vomiting after your treatment, we encourage you to take your nausea medication as prescribed.   If you develop nausea and vomiting that is not controlled by your nausea medication, call the clinic.   BELOW ARE SYMPTOMS THAT SHOULD BE REPORTED IMMEDIATELY:  *FEVER GREATER THAN 100.5 F  *CHILLS WITH OR WITHOUT FEVER  NAUSEA AND VOMITING THAT IS NOT CONTROLLED WITH YOUR NAUSEA MEDICATION  *UNUSUAL SHORTNESS OF BREATH  *UNUSUAL BRUISING OR BLEEDING  TENDERNESS IN MOUTH AND THROAT WITH OR WITHOUT PRESENCE OF ULCERS  *URINARY PROBLEMS  *BOWEL PROBLEMS  UNUSUAL RASH Items with * indicate a potential emergency and should be followed up as soon as possible.  Feel free to call the clinic you have any questions or concerns. The clinic phone number is [phone].  Please show the [place] at check-in to the Emergency Department and triage nurse.  Hypomagnesemia Hypomagnesemia is a condition in which the level of magnesium in the blood is low. Magnesium is a mineral  that is found in many foods. It is used in many different processes in the body. Hypomagnesemia can affect every organ in the body. It can cause life-threatening problems. CAUSES Causes of hypomagnesemia include:  Not getting enough magnesium in your diet.  Malnutrition.  Problems with absorbing magnesium from the intestines.  Dehydration.  Alcohol abuse.  Vomiting.  Severe diarrhea.  Some medicines, including  medicines that make you urinate more.  Certain diseases, such as kidney disease, diabetes, and overactive thyroid. SIGNS AND SYMPTOMS  Involuntary shaking or trembling of a body part (tremor).  Confusion.  Muscle weakness.  Sensitivity to light, sound, and touch.  Psychiatric issues, such as depression, irritability, or psychosis.  Sudden tightening of muscles (muscle spasms).  Tingling in the arms and legs.  A feeling of fluttering of the heart. These symptoms are more severe if magnesium levels drop suddenly. DIAGNOSIS To make a diagnosis, your health care provider will do a physical exam and order blood and urine tests. TREATMENT Treatment will depend on the cause and the severity of your condition. It may involve:  A magnesium supplement. This can be taken in pill form. It can also be given through an IV tube. This is usually done if the condition is severe.  Changes to your diet. You may be directed to eat foods that have a lot of magnesium, such as green leafy vegetables, peas, beans, and nuts.  Eliminating alcohol from your diet. HOME CARE INSTRUCTIONS  Include foods with magnesium in your diet. Foods that are rich in magnesium include green vegetables, beans, nuts and seeds, and whole grains.  Take medicines only as directed by your health care provider.  Take magnesium supplements if your health care provider instructs you to do that. Take them as directed.  Have your magnesium levels monitored as directed by your health care provider.  When you are active, drink fluids that contain electrolytes.  Keep all follow-up visits as directed by your health care provider. This is important. SEEK MEDICAL CARE IF:  You get worse instead of better.  Your symptoms return. SEEK IMMEDIATE MEDICAL CARE IF:  Your symptoms are severe.   This information is not intended to replace advice given to you by your health care provider. Make sure you discuss any questions you  have with your health care provider.   Document Released: 07/10/2005 Document Revised: 11/04/2014 Document Reviewed: 05/30/2014 Elsevier Interactive Patient Education [employer].

## 2015-12-25 NOTE — Patient Instructions (Signed)

## 2015-12-26 ENCOUNTER — Encounter: Payer: Self-pay | Admitting: Nurse Practitioner

## 2015-12-26 ENCOUNTER — Ambulatory Visit: Payer: PPO

## 2015-12-26 NOTE — Assessment & Plan Note (Signed)
Chemotherapy was held last week due to poor tolerance.  Patient states he is feeling much better this week; but continues to complain of chronic fatigue.  He denies any recent fevers or chills.  Blood counts obtained today were within normal limits.  Patient will proceed today with cycle 7 of his irinotecan and panitumumab therapy.  Patient received his last Feraheme infusion on 11/27/2015.  Will need to schedule patient for labs, flush,and a follow-up visit with a symptom management clinic on Monday, 01/01/2016.  Will also need to schedule patient for labs, flush, visit, and chemotherapy again on Monday, 01/08/2016.  Today's visit was with a translator present.

## 2015-12-26 NOTE — Assessment & Plan Note (Signed)
Patient states that his rash-most likely secondary to his immunotherapy infusions-has greatly improved within this past few weeks.   Exam today reveals resolving scattered rash to patient's back and neck.  Patient states that the pruritus has greatly improved as well.

## 2015-12-26 NOTE — Assessment & Plan Note (Signed)
Albumen remains low at 2.9 today.  Patient states that he presented to the emergency department this past weekend for increased edema to his bilateral lower extremities.  The emergency department physician felt no evidence of heart failure or fluid overload.  He determined that patient's bilateral lower extremity peripheral edema was most likely secondary to hypo-albuminemia.  Patient was advised to push protein in his diet is much as possible.  Patient was also advised to elevate his legs above the level of his heart whenever possible.  Patient was wearing compression stockings today during the exam.

## 2015-12-26 NOTE — Progress Notes (Signed)
Addendum: Magnesium was down to 0.7 today.  Patient confirmed that he is taking magnesium tablets 3 times per day as previously directed.  Patient will receive 6 grams magnesium sulfate today; which was built into his chemotherapy regimen.  Will continue to monitor his magnesium level closely.

## 2015-12-26 NOTE — Progress Notes (Signed)
SYMPTOM MANAGEMENT CLINIC   HPI: Jason Reilly 70 y.o. 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  Currently undergoing panitumumab/irinotecan regimen.   Patient's panitumumab/irniotecan chemotherapy regimen was held on Monday, 12/18/2015 secondary to poor tolerance.  Chemotherapy was held last week due to poor tolerance.  Patient states he is feeling much better this week; but continues to complain of chronic fatigue.  He denies any recent fevers or chills.  Blood counts obtained today were within normal limits.  Patient will proceed today with cycle 7 of his irinotecan and panitumumab therapy.  Patient received his last Feraheme infusion on 11/27/2015.  Will need to schedule patient for labs, flush,and a follow-up visit with a symptom management clinic on Monday, 01/01/2016.  Will also need to schedule patient for labs, flush, visit, and chemotherapy again on Monday, 01/08/2016.  Today's visit was with a translator present.  HPI  Review of Systems  Constitutional: Positive for malaise/fatigue. Negative for fever and chills.  Gastrointestinal: Negative for nausea, vomiting, abdominal pain, diarrhea, blood in stool and melena.  Skin: Positive for itching and rash.  All other systems reviewed and are negative.   Past Medical History  Diagnosis Date  . Tachycardia   . Abnormal EKG   . Hypertension   . Non-ischemic cardiomyopathy ([place])   . Hepatitis B   . H. pylori infection   . Gastric ulcer   . Metastatic colon cancer to liver ([place])   . Diabetes mellitus without complication ([place])     metformin    Past Surgical History  Procedure Laterality Date  . Nuclear stress test  03/03/2013    High risk - consistent with nonischemic cardiomyopathy  . Left and right heart catheterization with coronary angiogram N/A 03/29/2013    Procedure: LEFT AND RIGHT HEART CATHETERIZATION WITH CORONARY ANGIOGRAM;  Surgeon: Pixie Casino, MD;  Location: [facility] CATH LAB;   Service: Cardiovascular;  Laterality: N/A;  . Esophagogastroduodenoscopy N/A 02/08/2015    Procedure: ESOPHAGOGASTRODUODENOSCOPY (EGD);  Surgeon: Ladene Artist, MD;  Location: [facility];  Service: Endoscopy;  Laterality: N/A;  . Esophagogastroduodenoscopy (egd) with propofol N/A 08/15/2015    Procedure: ESOPHAGOGASTRODUODENOSCOPY (EGD) WITH PROPOFOL;  Surgeon: Ladene Artist, MD;  Location: [facility] ENDOSCOPY;  Service: Endoscopy;  Laterality: N/A;    has Hypertension; Abnormal nuclear cardiac imaging test; Acute systolic heart failure ([place]); Non-ischemic cardiomyopathy ([place]); Cancer of sigmoid colon ([place]); Acute renal failure ([place]); Hyponatremia; Weakness; Acute blood loss anemia; Leukocytosis; Chronic combined systolic and diastolic heart failure ([name]); Arterial hypotension; Gastric ulcer with hemorrhage but without obstruction; Upper GI bleed; Absolute anemia; Nausea with vomiting; Dehydration; Hyperphosphatemia; Rash; Anemia, iron deficiency; Acute colitis; Duodenal ulcer without hemorrhage, perforation, or obstruction; Esophageal varices ([place]); Gastric ulcer with hemorrhage; Hypotension; Sepsis ([place]); Acute on chronic systolic (congestive) heart failure ([place]); Leukopenia due to antineoplastic chemotherapy; Influenza A; Cancer of sigmoid ([place]); Diabetes mellitus, type 2 ([place]); Hypomagnesemia; and Hypoalbuminemia due to protein-calorie malnutrition ([place]) on his problem list.    has No Known Allergies.    Medication List       This list is accurate as of: 12/25/15 11:59 PM.  Always use your most recent med list.               clindamycin 1 % gel  Commonly known as:  CLINDAGEL  Apply topically 2 (two) times daily.     folic acid 1 MG tablet  Commonly known as:  FOLVITE  Take 1 tablet (1 mg total) by  mouth daily.     hydrocortisone 1 % ointment  Apply 1 application topically 2 (two) times daily.     lidocaine-prilocaine cream  Commonly known as:  EMLA  Apply 1 application topically as  needed. Apply to [facility] cath at least one hour before needle stick.     loperamide 2 MG capsule  Commonly known as:  IMODIUM  Take 2 capsules (4 mg total) by mouth 4 (four) times daily as needed for diarrhea or loose stools (NO MORE THAN 8 TABLETS PER DAY.).     magic mouthwash Soln  Take 5 mLs by mouth 4 (four) times daily.     magnesium oxide 400 (241.3 Mg) MG tablet  Commonly known as:  MAG-OX  Take 1 tablet (400 mg total) by mouth 3 (three) times daily.     metFORMIN 500 MG tablet  Commonly known as:  GLUCOPHAGE  Take 1 tablet (500 mg total) by mouth 2 (two) times daily with a meal. THIS IS A ONE TIME ORDER.  FUTURE REFILLS NEED TO BE DONE BY PRIMARY MD.     nadolol 20 MG tablet  Commonly known as:  CORGARD  Take 1 tablet (20 mg total) by mouth 2 (two) times daily. THIS IS A ONE TIME ORDER.  FUTURE REFILLS NEED TO BE DONE BY PRIMARY MD.     ondansetron 8 MG tablet  Commonly known as:  ZOFRAN  Take 1 tablet (8 mg total) by mouth every 8 (eight) hours as needed for nausea or vomiting.     pantoprazole 40 MG tablet  Commonly known as:  PROTONIX  Take 1 tablet (40 mg total) by mouth daily.     prochlorperazine 10 MG tablet  Commonly known as:  COMPAZINE  Take 1 tablet (10 mg total) by mouth every 6 (six) hours as needed for nausea or vomiting.         PHYSICAL EXAMINATION  Oncology Vitals 12/25/2015 12/23/2015  Height 165 cm -  Weight 63.685 kg -  Weight (lbs) 140 lbs 6 oz -  BMI (kg/m2) 23.36 kg/m2 -  Temp 96.7 97.5  Pulse 65 65  Resp 17 18  SpO2 100 98  BSA (m2) 1.71 m2 -   BP Readings from Last 2 Encounters:  12/25/15 126/69  12/23/15 119/68    Physical Exam  Constitutional: He is oriented to person, place, and time. Vital signs are normal. He appears malnourished. He appears unhealthy. He appears cachectic.  HENT:  Head: Normocephalic and atraumatic.  Mouth/Throat: Oropharynx is clear and moist.  Eyes: Conjunctivae and EOM are normal. Pupils are equal,  round, and reactive to light. Right eye exhibits no discharge. Left eye exhibits no discharge. No scleral icterus.  Neck: Normal range of motion.  Pulmonary/Chest: Effort normal. No respiratory distress.  Musculoskeletal: Normal range of motion.  Neurological: He is alert and oriented to person, place, and time. Gait normal.  Skin: Skin is warm and dry. There is pallor.  Psychiatric: Affect normal.    LABORATORY DATA:. Appointment on 12/25/2015  Component Date Value Ref Range Status  . Sodium 12/25/2015 138  136 - 145 mEq/L Final  . Potassium 12/25/2015 4.5  3.5 - 5.1 mEq/L Final  . Chloride 12/25/2015 103  98 - 109 mEq/L Final  . CO2 12/25/2015 27  22 - 29 mEq/L Final  . Glucose 12/25/2015 107  70 - 140 mg/dl Final   Glucose reference range is for nonfasting patients. Fasting glucose reference range is 70- 100.  Marland Kitchen BUN 12/25/2015  8.5  7.0 - 26.0 mg/dL Final  . Creatinine 12/25/2015 0.7  0.7 - 1.3 mg/dL Final  . Total Bilirubin 12/25/2015 0.39  0.20 - 1.20 mg/dL Final  . Alkaline Phosphatase 12/25/2015 160* 40 - 150 U/L Final  . AST 12/25/2015 32  5 - 34 U/L Final  . ALT 12/25/2015 23  0 - 55 U/L Final  . Total Protein 12/25/2015 7.1  6.4 - 8.3 g/dL Final  . Albumin 12/25/2015 2.9* 3.5 - 5.0 g/dL Final  . Calcium 12/25/2015 7.7* 8.4 - 10.4 mg/dL Final  . Anion Gap 12/25/2015 8  3 - 11 mEq/L Final  . EGFR 12/25/2015 >90  >90 ml/min/1.73 m2 Final   eGFR is calculated using the CKD-EPI Creatinine Equation (2009)  . WBC 12/25/2015 6.4  4.0 - 10.3 10e3/uL Final  . NEUT# 12/25/2015 4.6  1.5 - 6.5 10e3/uL Final  . HGB 12/25/2015 10.4* 13.0 - 17.1 g/dL Final  . HCT 12/25/2015 31.3* 38.4 - 49.9 % Final  . Platelets 12/25/2015 207  140 - 400 10e3/uL Final  . MCV 12/25/2015 88.7  79.3 - 98.0 fL Final  . MCH 12/25/2015 29.4  27.2 - 33.4 pg Final  . MCHC 12/25/2015 33.2  32.0 - 36.0 g/dL Final  . RBC 12/25/2015 3.53* 4.20 - 5.82 10e6/uL Final  . RDW 12/25/2015 17.2* 11.0 - 14.6 % Final  .  lymph# 12/25/2015 0.9  0.9 - 3.3 10e3/uL Final  . MONO# 12/25/2015 0.6  0.1 - 0.9 10e3/uL Final  . Eosinophils Absolute 12/25/2015 0.3  0.0 - 0.5 10e3/uL Final  . Basophils Absolute 12/25/2015 0.0  0.0 - 0.1 10e3/uL Final  . NEUT% 12/25/2015 71.7  39.0 - 75.0 % Final  . LYMPH% 12/25/2015 13.4* 14.0 - 49.0 % Final  . MONO% 12/25/2015 10.2  0.0 - 14.0 % Final  . EOS% 12/25/2015 4.3  0.0 - 7.0 % Final  . BASO% 12/25/2015 0.4  0.0 - 2.0 % Final  . Magnesium 12/25/2015 0.7 Repeated and Verified* 1.5 - 2.5 mg/dl Final     RADIOGRAPHIC STUDIES: No results found.  ASSESSMENT/PLAN:    Rash Patient states that his rash-most likely secondary to his immunotherapy infusions-has greatly improved within this past few weeks.   Exam today reveals resolving scattered rash to patient's back and neck.  Patient states that the pruritus has greatly improved as well.    Hypoalbuminemia due to protein-calorie malnutrition ([place]) Albumen remains low at 2.9 today.  Patient states that he presented to the emergency department this past weekend for increased edema to his bilateral lower extremities.  The emergency department physician felt no evidence of heart failure or fluid overload.  He determined that patient's bilateral lower extremity peripheral edema was most likely secondary to hypo-albuminemia.  Patient was advised to push protein in his diet is much as possible.  Patient was also advised to elevate his legs above the level of his heart whenever possible.  Patient was wearing compression stockings today during the exam.  Cancer of sigmoid colon ([place]) Chemotherapy was held last week due to poor tolerance.  Patient states he is feeling much better this week; but continues to complain of chronic fatigue.  He denies any recent fevers or chills.  Blood counts obtained today were within normal limits.  Patient will proceed today with cycle 7 of his irinotecan and panitumumab therapy.  Patient received his  last Feraheme infusion on 11/27/2015.  Will need to schedule patient for labs, flush,and a follow-up visit with a symptom  management clinic on Monday, 01/01/2016.  Will also need to schedule patient for labs, flush, visit, and chemotherapy again on Monday, 01/08/2016.  Today's visit was with a translator present.   Patient stated understanding of all instructions; and was in agreement with this plan of care. The patient knows to call the clinic with any problems, questions or concerns.   Review/collaboration with [name]  regarding all aspects of patient's visit today.   Total time spent with patient was 25 minutes;  with greater than 75 percent of that time spent in face to face counseling regarding patient's symptoms,  and coordination of care and follow up.  Disclaimer:This dictation was prepared with Dragon/digital dictation along with [employer]. Any transcriptional errors that result from this process are unintentional.  Drue Second, NP 12/26/2015

## 2015-12-27 ENCOUNTER — Telehealth: Payer: Self-pay | Admitting: Hematology

## 2015-12-27 ENCOUNTER — Ambulatory Visit: Payer: Self-pay

## 2015-12-27 ENCOUNTER — Telehealth: Payer: Self-pay | Admitting: *Deleted

## 2015-12-27 NOTE — Telephone Encounter (Signed)
Used language line to inform patient of 3/6 appt per 3/1 pof

## 2015-12-27 NOTE — Telephone Encounter (Signed)
Per staff message and POF I have scheduled appts. Advised scheduler of appts. JMW  

## 2015-12-28 NOTE — Progress Notes (Signed)
This encounter was created in error - please disregard.

## 2015-12-29 ENCOUNTER — Other Ambulatory Visit: Payer: Self-pay

## 2015-12-29 DIAGNOSIS — E0829 Diabetes mellitus due to underlying condition with other diabetic kidney complication: Secondary | ICD-10-CM

## 2015-12-29 NOTE — Patient Outreach (Signed)
[place] [facility]) Care Management  12/29/2015  Pinchus Dischler 1946/02/09 [id number]   SUBJECTIVE: Telephone call to patient regarding [facility] referral. HIPAA verified with patient. Discussed [facility] care management services with patient. Patient agreed to have [facility] community case Freight forwarder. Patient states he needs to have nurse follow up and request the time. Patient confirmed he has diabetes. States his wife checks his blood sugar every day. Patient states today's blood sugar is 97.   ASSESSMENT: [facility] referral for nurse teaching/support for diabetes, Mets Colon Ca.  Patient able to speak [place]. Patient of vietnamese [place] dialect.  Patient had some difficulty communicating health information with nurse by phone. Patient request time when nurse will come out to home. Patient exhibited some impatience with communicating by phone.  Would recommend interpreter for home visits.  Poor historian. Unable to gather information.  Per EPIC NOTE: Metastatic sigmoid colon cancer, with diffuse liver, lungs, node and left adrenal gland metastases. Patient also has diabetes with most recent A1c 8.5.  Patient is being followed at the [facility] cancer treatment by Sharene Butters, PA.  Per EPIC note patients treatment goal is palliative.  Patient would benefit from [facility] follow up for education/support due to cancer/diabetes. Assessment of needs and family support. Per EPIC note oncology provider recommended patient follow up with primary MD regarding diabetes management.  Patient states he has not seen primary provider within a year.   PLAN: RNCM will refer patient to community case manager   Quinn Plowman RN,BSN,CCM [facility] Telephonic  [phone]

## 2016-01-01 ENCOUNTER — Other Ambulatory Visit (HOSPITAL_BASED_OUTPATIENT_CLINIC_OR_DEPARTMENT_OTHER): Payer: PPO

## 2016-01-01 ENCOUNTER — Ambulatory Visit (HOSPITAL_BASED_OUTPATIENT_CLINIC_OR_DEPARTMENT_OTHER): Payer: PPO

## 2016-01-01 ENCOUNTER — Ambulatory Visit (HOSPITAL_BASED_OUTPATIENT_CLINIC_OR_DEPARTMENT_OTHER): Payer: PPO | Admitting: Nurse Practitioner

## 2016-01-01 ENCOUNTER — Other Ambulatory Visit: Payer: Self-pay | Admitting: *Deleted

## 2016-01-01 ENCOUNTER — Ambulatory Visit: Payer: PPO

## 2016-01-01 VITALS — BP 125/74 | HR 98 | Resp 18

## 2016-01-01 VITALS — BP 114/63 | HR 98 | Temp 98.2°F | Resp 16 | Ht 65.0 in | Wt 135.3 lb

## 2016-01-01 DIAGNOSIS — C187 Malignant neoplasm of sigmoid colon: Secondary | ICD-10-CM

## 2016-01-01 DIAGNOSIS — Z452 Encounter for adjustment and management of vascular access device: Secondary | ICD-10-CM | POA: Diagnosis not present

## 2016-01-01 DIAGNOSIS — C787 Secondary malignant neoplasm of liver and intrahepatic bile duct: Secondary | ICD-10-CM

## 2016-01-01 DIAGNOSIS — C7970 Secondary malignant neoplasm of unspecified adrenal gland: Secondary | ICD-10-CM | POA: Diagnosis not present

## 2016-01-01 LAB — COMPREHENSIVE METABOLIC PANEL
ALT: 39 U/L (ref 0–55)
AST: 29 U/L (ref 5–34)
Albumin: 3.2 g/dL — ABNORMAL LOW (ref 3.5–5.0)
Alkaline Phosphatase: 135 U/L (ref 40–150)
Anion Gap: 8 mEq/L (ref 3–11)
BUN: 14.2 mg/dL (ref 7.0–26.0)
CALCIUM: 8.6 mg/dL (ref 8.4–10.4)
CHLORIDE: 104 meq/L (ref 98–109)
CO2: 24 meq/L (ref 22–29)
CREATININE: 0.7 mg/dL (ref 0.7–1.3)
EGFR: >90 mL/min/{1.73_m2} (ref >90)
GLUCOSE: 144 mg/dL — AB (ref 70–140)
Potassium: 4.4 mEq/L (ref 3.5–5.1)
Sodium: 136 mEq/L (ref 136–145)
Total Bilirubin: 0.6 mg/dL (ref 0.20–1.20)
Total Protein: 7.2 g/dL (ref 6.4–8.3)

## 2016-01-01 LAB — CBC WITH DIFFERENTIAL/PLATELET
BASO%: 0.6 % (ref 0.0–2.0)
BASOS ABS: 0 10*3/uL (ref 0.0–0.1)
EOS%: 2.5 % (ref 0.0–7.0)
Eosinophils Absolute: 0.2 10*3/uL (ref 0.0–0.5)
HCT: 31.7 % — ABNORMAL LOW (ref 38.4–49.9)
HEMOGLOBIN: 10.4 g/dL — AB (ref 13.0–17.1)
LYMPH#: 1 10*3/uL (ref 0.9–3.3)
LYMPH%: 16 % (ref 14.0–49.0)
MCH: 29.1 pg (ref 27.2–33.4)
MCHC: 32.9 g/dL (ref 32.0–36.0)
MCV: 88.5 fL (ref 79.3–98.0)
MONO#: 0.3 10*3/uL (ref 0.1–0.9)
MONO%: 4.4 % (ref 0.0–14.0)
NEUT%: 76.5 % — ABNORMAL HIGH (ref 39.0–75.0)
NEUTROS ABS: 4.9 10*3/uL (ref 1.5–6.5)
Platelets: 170 10*3/uL (ref 140–400)
RBC: 3.58 10*6/uL — ABNORMAL LOW (ref 4.20–5.82)
RDW: 17 % — AB (ref 11.0–14.6)
WBC: 6.4 10*3/uL (ref 4.0–10.3)

## 2016-01-01 LAB — MAGNESIUM: MAGNESIUM: 0.9 mg/dL — AB (ref 1.5–2.5)

## 2016-01-01 LAB — FERRITIN

## 2016-01-01 MED ORDER — SODIUM CHLORIDE 0.9% FLUSH
10.0000 mL | INTRAVENOUS | Status: DC | PRN
  Administered 2016-01-01: 10 mL via INTRAVENOUS
  Filled 2016-01-01: qty 10

## 2016-01-01 MED ORDER — HEPARIN SOD (PORK) LOCK FLUSH 100 UNIT/ML IV SOLN
500.0000 [IU] | Freq: Once | INTRAVENOUS | Status: AC
  Administered 2016-01-01: 500 [IU] via INTRAVENOUS
  Filled 2016-01-01: qty 5

## 2016-01-01 MED ORDER — SODIUM CHLORIDE 0.9 % IJ SOLN
3.0000 mL | Freq: Once | INTRAMUSCULAR | Status: AC | PRN
  Administered 2016-01-01: 3 mL via INTRAVENOUS
  Filled 2016-01-01: qty 10

## 2016-01-01 MED ORDER — HEPARIN SOD (PORK) LOCK FLUSH 100 UNIT/ML IV SOLN
250.0000 [IU] | Freq: Once | INTRAVENOUS | Status: AC | PRN
  Administered 2016-01-01: 250 [IU]
  Filled 2016-01-01: qty 5

## 2016-01-01 MED ORDER — SODIUM CHLORIDE 0.9 % IV SOLN
2000.0000 mg | Freq: Once | INTRAVENOUS | Status: AC
  Administered 2016-01-01: 2000 mg via INTRAVENOUS
  Filled 2016-01-01: qty 4

## 2016-01-01 NOTE — Patient Instructions (Signed)
Hypomagnesemia Hypomagnesemia is a condition in which the level of magnesium in the blood is low. Magnesium is a mineral that is found in many foods. It is used in many different processes in the body. Hypomagnesemia can affect every organ in the body. It can cause life-threatening problems. CAUSES Causes of hypomagnesemia include:  Not getting enough magnesium in your diet.  Malnutrition.  Problems with absorbing magnesium from the intestines.  Dehydration.  Alcohol abuse.  Vomiting.  Severe diarrhea.  Some medicines, including medicines that make you urinate more.  Certain diseases, such as kidney disease, diabetes, and overactive thyroid. SIGNS AND SYMPTOMS  Involuntary shaking or trembling of a body part (tremor).  Confusion.  Muscle weakness.  Sensitivity to light, sound, and touch.  Psychiatric issues, such as depression, irritability, or psychosis.  Sudden tightening of muscles (muscle spasms).  Tingling in the arms and legs.  A feeling of fluttering of the heart. These symptoms are more severe if magnesium levels drop suddenly. DIAGNOSIS To make a diagnosis, your health care provider will do a physical exam and order blood and urine tests. TREATMENT Treatment will depend on the cause and the severity of your condition. It may involve:  A magnesium supplement. This can be taken in pill form. It can also be given through an IV tube. This is usually done if the condition is severe.  Changes to your diet. You may be directed to eat foods that have a lot of magnesium, such as green leafy vegetables, peas, beans, and nuts.  Eliminating alcohol from your diet. HOME CARE INSTRUCTIONS  Include foods with magnesium in your diet. Foods that are rich in magnesium include green vegetables, beans, nuts and seeds, and whole grains.  Take medicines only as directed by your health care provider.  Take magnesium supplements if your health care provider instructs you to  do that. Take them as directed.  Have your magnesium levels monitored as directed by your health care provider.  When you are active, drink fluids that contain electrolytes.  Keep all follow-up visits as directed by your health care provider. This is important. SEEK MEDICAL CARE IF:  You get worse instead of better.  Your symptoms return. SEEK IMMEDIATE MEDICAL CARE IF:  Your symptoms are severe.   This information is not intended to replace advice given to you by your health care provider. Make sure you discuss any questions you have with your health care provider.   Document Released: 07/10/2005 Document Revised: 11/04/2014 Document Reviewed: 05/30/2014 Elsevier Interactive Patient Education 2016 [place] injection What is this medicine? FERUMOXYTOL is an iron complex. Iron is used to make healthy red blood cells, which carry oxygen and nutrients throughout the body. This medicine is used to treat iron deficiency anemia in people with chronic kidney disease. This medicine may be used for other purposes; ask your health care provider or pharmacist if you have questions. What should I tell my health care provider before I take this medicine? They need to know if you have any of these conditions: -anemia not caused by low iron levels -high levels of iron in the blood -magnetic resonance imaging (MRI) test scheduled -an unusual or allergic reaction to iron, other medicines, foods, dyes, or preservatives -pregnant or trying to get pregnant -breast-feeding How should I use this medicine? This medicine is for injection into a vein. It is given by a health care professional in a hospital or clinic setting. Talk to your pediatrician regarding the use  of this medicine in children. Special care may be needed. Overdosage: If you think you have taken too much of this medicine contact a poison control center or emergency room at once. NOTE: This medicine is only for  you. Do not share this medicine with others. What if I miss a dose? It is important not to miss your dose. Call your doctor or health care professional if you are unable to keep an appointment. What may interact with this medicine? This medicine may interact with the following medications: -other iron products This list may not describe all possible interactions. Give your health care provider a list of all the medicines, herbs, non-prescription drugs, or dietary supplements you use. Also tell them if you smoke, drink alcohol, or use illegal drugs. Some items may interact with your medicine. What should I watch for while using this medicine? Visit your doctor or healthcare professional regularly. Tell your doctor or healthcare professional if your symptoms do not start to get better or if they get worse. You may need blood work done while you are taking this medicine. You may need to follow a special diet. Talk to your doctor. Foods that contain iron include: whole grains/cereals, dried fruits, beans, or peas, leafy green vegetables, and organ meats (liver, kidney). What side effects may I notice from receiving this medicine? Side effects that you should report to your doctor or health care professional as soon as possible: -allergic reactions like skin rash, itching or hives, swelling of the face, lips, or tongue -breathing problems -changes in blood pressure -feeling faint or lightheaded, falls -fever or chills -flushing, sweating, or hot feelings -swelling of the ankles or feet Side effects that usually do not require medical attention (Report these to your doctor or health care professional if they continue or are bothersome.): -diarrhea -headache -nausea, vomiting -stomach pain This list may not describe all possible side effects. Call your doctor for medical advice about side effects. You may report side effects to FDA at 1-800-FDA-1088. Where should I keep my medicine? This drug is  given in a hospital or clinic and will not be stored at home. NOTE: This sheet is a summary. It may not cover all possible information. If you have questions about this medicine, talk to your doctor, pharmacist, or health care provider.    2016, Elsevier/Gold Standard. (2012-05-29 15:23:36)  [facility] [place] Discharge Instructions for Patients Receiving Chemotherapy  Today you received the following chemotherapy agents Irinotecan/Vectibix/Magnesium.  To help prevent nausea and vomiting after your treatment, we encourage you to take your nausea medication as prescribed.   If you develop nausea and vomiting that is not controlled by your nausea medication, call the clinic.   BELOW ARE SYMPTOMS THAT SHOULD BE REPORTED IMMEDIATELY:  *FEVER GREATER THAN 100.5 F  *CHILLS WITH OR WITHOUT FEVER  NAUSEA AND VOMITING THAT IS NOT CONTROLLED WITH YOUR NAUSEA MEDICATION  *UNUSUAL SHORTNESS OF BREATH  *UNUSUAL BRUISING OR BLEEDING  TENDERNESS IN MOUTH AND THROAT WITH OR WITHOUT PRESENCE OF ULCERS  *URINARY PROBLEMS  *BOWEL PROBLEMS  UNUSUAL RASH Items with * indicate a potential emergency and should be followed up as soon as possible.  Feel free to call the clinic you have any questions or concerns. The clinic phone number is [phone].  Please show the [place] at check-in to the Emergency Department and triage nurse.  Hypomagnesemia Hypomagnesemia is a condition in which the level of magnesium in the blood is low. Magnesium is a mineral  that is found in many foods. It is used in many different processes in the body. Hypomagnesemia can affect every organ in the body. It can cause life-threatening problems. CAUSES Causes of hypomagnesemia include:  Not getting enough magnesium in your diet.  Malnutrition.  Problems with absorbing magnesium from the intestines.  Dehydration.  Alcohol abuse.  Vomiting.  Severe diarrhea.  Some medicines, including  medicines that make you urinate more.  Certain diseases, such as kidney disease, diabetes, and overactive thyroid. SIGNS AND SYMPTOMS  Involuntary shaking or trembling of a body part (tremor).  Confusion.  Muscle weakness.  Sensitivity to light, sound, and touch.  Psychiatric issues, such as depression, irritability, or psychosis.  Sudden tightening of muscles (muscle spasms).  Tingling in the arms and legs.  A feeling of fluttering of the heart. These symptoms are more severe if magnesium levels drop suddenly. DIAGNOSIS To make a diagnosis, your health care provider will do a physical exam and order blood and urine tests. TREATMENT Treatment will depend on the cause and the severity of your condition. It may involve:  A magnesium supplement. This can be taken in pill form. It can also be given through an IV tube. This is usually done if the condition is severe.  Changes to your diet. You may be directed to eat foods that have a lot of magnesium, such as green leafy vegetables, peas, beans, and nuts.  Eliminating alcohol from your diet. HOME CARE INSTRUCTIONS  Include foods with magnesium in your diet. Foods that are rich in magnesium include green vegetables, beans, nuts and seeds, and whole grains.  Take medicines only as directed by your health care provider.  Take magnesium supplements if your health care provider instructs you to do that. Take them as directed.  Have your magnesium levels monitored as directed by your health care provider.  When you are active, drink fluids that contain electrolytes.  Keep all follow-up visits as directed by your health care provider. This is important. SEEK MEDICAL CARE IF:  You get worse instead of better.  Your symptoms return. SEEK IMMEDIATE MEDICAL CARE IF:  Your symptoms are severe.   This information is not intended to replace advice given to you by your health care provider. Make sure you discuss any questions you  have with your health care provider.   Document Released: 07/10/2005 Document Revised: 11/04/2014 Document Reviewed: 05/30/2014 Elsevier Interactive Patient Education [employer].

## 2016-01-02 ENCOUNTER — Encounter: Payer: Self-pay | Admitting: Nurse Practitioner

## 2016-01-02 NOTE — Assessment & Plan Note (Signed)
Magnesium down to 0.9 despite pt taking magnesium tablets TID. Pt will receive mag sulfate 4 grams IV over 2 hours today while at the cancer center. Will continue to monitor closely.

## 2016-01-02 NOTE — Assessment & Plan Note (Signed)
Pt received cycle 7 of his irinotecan and panitumumab therapy last week on 12/25/15.   Patient received his last Feraheme infusion on 11/27/2015.  Patient to return  for labs, flush, visit, and chemotherapy again on Monday, 01/08/2016.

## 2016-01-02 NOTE — Progress Notes (Signed)
SYMPTOM MANAGEMENT CLINIC   HPI: Jason Reilly 70 y.o. 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  Currently undergoing panitumumab/irinotecan regimen.  Pt in today for a recheck.  He continues to c/o fatigue; but no other symptoms. He continues with chronic low magnesium secondary to his chemo.   HPI  Review of Systems  Constitutional: Positive for malaise/fatigue. Negative for fever and chills.  Gastrointestinal: Negative for nausea, vomiting, abdominal pain, diarrhea, blood in stool and melena.  Skin: Positive for itching and rash.  All other systems reviewed and are negative.   Past Medical History  Diagnosis Date  . Tachycardia   . Abnormal EKG   . Hypertension   . Non-ischemic cardiomyopathy ([place])   . Hepatitis B   . H. pylori infection   . Gastric ulcer   . Metastatic colon cancer to liver ([place])   . Diabetes mellitus without complication ([place])     metformin    Past Surgical History  Procedure Laterality Date  . Nuclear stress test  03/03/2013    High risk - consistent with nonischemic cardiomyopathy  . Left and right heart catheterization with coronary angiogram N/A 03/29/2013    Procedure: LEFT AND RIGHT HEART CATHETERIZATION WITH CORONARY ANGIOGRAM;  Surgeon: Pixie Casino, MD;  Location: [facility] CATH LAB;  Service: Cardiovascular;  Laterality: N/A;  . Esophagogastroduodenoscopy N/A 02/08/2015    Procedure: ESOPHAGOGASTRODUODENOSCOPY (EGD);  Surgeon: Ladene Artist, MD;  Location: [facility] ENDOSCOPY;  Service: Endoscopy;  Laterality: N/A;  . Esophagogastroduodenoscopy (egd) with propofol N/A 08/15/2015    Procedure: ESOPHAGOGASTRODUODENOSCOPY (EGD) WITH PROPOFOL;  Surgeon: Ladene Artist, MD;  Location: [facility] ENDOSCOPY;  Service: Endoscopy;  Laterality: N/A;    has Hypertension; Abnormal nuclear cardiac imaging test; Acute systolic heart failure ([place]); Non-ischemic cardiomyopathy ([place]); Cancer of sigmoid colon ([place]); Acute renal failure ([place]);  Hyponatremia; Weakness; Acute blood loss anemia; Leukocytosis; Chronic combined systolic and diastolic heart failure ([place]); Arterial hypotension; Gastric ulcer with hemorrhage but without obstruction; Upper GI bleed; Absolute anemia; Nausea with vomiting; Dehydration; Hyperphosphatemia; Rash; Anemia, iron deficiency; Acute colitis; Duodenal ulcer without hemorrhage, perforation, or obstruction; Esophageal varices ([place]); Gastric ulcer with hemorrhage; Hypotension; Sepsis ([place]); Acute on chronic systolic (congestive) heart failure ([place]); Leukopenia due to antineoplastic chemotherapy; Influenza A; Cancer of sigmoid ([place]); Diabetes mellitus, type 2 ([place]); Hypomagnesemia; and Hypoalbuminemia due to protein-calorie malnutrition ([place]) on his problem list.    has No Known Allergies.    Medication List       This list is accurate as of: 01/01/16 11:59 PM.  Always use your most recent med list.               clindamycin 1 % gel  Commonly known as:  CLINDAGEL  Apply topically 2 (two) times daily.     folic acid 1 MG tablet  Commonly known as:  FOLVITE  Take 1 tablet (1 mg total) by mouth daily.     hydrocortisone 1 % ointment  Apply 1 application topically 2 (two) times daily.     lidocaine-prilocaine cream  Commonly known as:  EMLA  Apply 1 application topically as needed. Apply to [facility] cath at least one hour before needle stick.     loperamide 2 MG capsule  Commonly known as:  IMODIUM  Take 2 capsules (4 mg total) by mouth 4 (four) times daily as needed for diarrhea or loose stools (NO MORE THAN 8 TABLETS PER DAY.).     magic mouthwash Soln  Take  5 mLs by mouth 4 (four) times daily.     magnesium oxide 400 (241.3 Mg) MG tablet  Commonly known as:  MAG-OX  Take 1 tablet (400 mg total) by mouth 3 (three) times daily.     metFORMIN 500 MG tablet  Commonly known as:  GLUCOPHAGE  Take 1 tablet (500 mg total) by mouth 2 (two) times daily with a meal. THIS IS A ONE TIME ORDER.  FUTURE  REFILLS NEED TO BE DONE BY PRIMARY MD.     nadolol 20 MG tablet  Commonly known as:  CORGARD  Take 1 tablet (20 mg total) by mouth 2 (two) times daily. THIS IS A ONE TIME ORDER.  FUTURE REFILLS NEED TO BE DONE BY PRIMARY MD.     ondansetron 8 MG tablet  Commonly known as:  ZOFRAN  Take 1 tablet (8 mg total) by mouth every 8 (eight) hours as needed for nausea or vomiting.     pantoprazole 40 MG tablet  Commonly known as:  PROTONIX  Take 1 tablet (40 mg total) by mouth daily.     prochlorperazine 10 MG tablet  Commonly known as:  COMPAZINE  Take 1 tablet (10 mg total) by mouth every 6 (six) hours as needed for nausea or vomiting.         PHYSICAL EXAMINATION  Oncology Vitals 01/01/2016 01/01/2016  Height - 165 cm  Weight - 61.372 kg  Weight (lbs) - 135 lbs 5 oz  BMI (kg/m2) - 22.52 kg/m2  Temp - 98.2  Pulse 98 98  Resp 18 16  SpO2 - 98  BSA (m2) - 1.68 m2   BP Readings from Last 2 Encounters:  01/01/16 125/74  01/01/16 114/63    Physical Exam  Constitutional: He is oriented to person, place, and time. Vital signs are normal. He appears malnourished. He appears unhealthy. He appears cachectic.  HENT:  Head: Normocephalic and atraumatic.  Mouth/Throat: Oropharynx is clear and moist.  Eyes: Conjunctivae and EOM are normal. Pupils are equal, round, and reactive to light. Right eye exhibits no discharge. Left eye exhibits no discharge. No scleral icterus.  Neck: Normal range of motion.  Pulmonary/Chest: Effort normal. No respiratory distress.  Musculoskeletal: Normal range of motion.  Neurological: He is alert and oriented to person, place, and time. Gait normal.  Skin: Skin is warm and dry. There is pallor.  Psychiatric: Affect normal.    LABORATORY DATA:. Appointment on 01/01/2016  Component Date Value Ref Range Status  . WBC 01/01/2016 6.4  4.0 - 10.3 10e3/uL Final  . NEUT# 01/01/2016 4.9  1.5 - 6.5 10e3/uL Final  . HGB 01/01/2016 10.4* 13.0 - 17.1 g/dL Final  . HCT  01/01/2016 31.7* 38.4 - 49.9 % Final  . Platelets 01/01/2016 170  140 - 400 10e3/uL Final  . MCV 01/01/2016 88.5  79.3 - 98.0 fL Final  . MCH 01/01/2016 29.1  27.2 - 33.4 pg Final  . MCHC 01/01/2016 32.9  32.0 - 36.0 g/dL Final  . RBC 01/01/2016 3.58* 4.20 - 5.82 10e6/uL Final  . RDW 01/01/2016 17.0* 11.0 - 14.6 % Final  . lymph# 01/01/2016 1.0  0.9 - 3.3 10e3/uL Final  . MONO# 01/01/2016 0.3  0.1 - 0.9 10e3/uL Final  . Eosinophils Absolute 01/01/2016 0.2  0.0 - 0.5 10e3/uL Final  . Basophils Absolute 01/01/2016 0.0  0.0 - 0.1 10e3/uL Final  . NEUT% 01/01/2016 76.5* 39.0 - 75.0 % Final  . LYMPH% 01/01/2016 16.0  14.0 - 49.0 % Final  .  MONO% 01/01/2016 4.4  0.0 - 14.0 % Final  . EOS% 01/01/2016 2.5  0.0 - 7.0 % Final  . BASO% 01/01/2016 0.6  0.0 - 2.0 % Final  . Magnesium 01/01/2016 0.9* 1.5 - 2.5 mg/dl Final  . Ferritin 01/01/2016 1,095* 22 - 316 ng/ml Final  . Sodium 01/01/2016 136  136 - 145 mEq/L Final  . Potassium 01/01/2016 4.4  3.5 - 5.1 mEq/L Final  . Chloride 01/01/2016 104  98 - 109 mEq/L Final  . CO2 01/01/2016 24  22 - 29 mEq/L Final  . Glucose 01/01/2016 144* 70 - 140 mg/dl Final   Glucose reference range is for nonfasting patients. Fasting glucose reference range is 70- 100.  Marland Kitchen BUN 01/01/2016 14.2  7.0 - 26.0 mg/dL Final  . Creatinine 01/01/2016 0.7  0.7 - 1.3 mg/dL Final  . Total Bilirubin 01/01/2016 0.60  0.20 - 1.20 mg/dL Final  . Alkaline Phosphatase 01/01/2016 135  40 - 150 U/L Final  . AST 01/01/2016 29  5 - 34 U/L Final  . ALT 01/01/2016 39  0 - 55 U/L Final  . Total Protein 01/01/2016 7.2  6.4 - 8.3 g/dL Final  . Albumin 01/01/2016 3.2* 3.5 - 5.0 g/dL Final  . Calcium 01/01/2016 8.6  8.4 - 10.4 mg/dL Final  . Anion Gap 01/01/2016 8  3 - 11 mEq/L Final  . EGFR 01/01/2016 >90  >90 ml/min/1.73 m2 Final   eGFR is calculated using the CKD-EPI Creatinine Equation (2009)     RADIOGRAPHIC STUDIES: No results found.  ASSESSMENT/PLAN:    Hypomagnesemia Magnesium  down to 0.9 despite pt taking magnesium tablets TID. Pt will receive mag sulfate 4 grams IV over 2 hours today while at the cancer center. Will continue to monitor closely.   Cancer of sigmoid colon ([place]) Pt received cycle 7 of his irinotecan and panitumumab therapy last week on 12/25/15.   Patient received his last Feraheme infusion on 11/27/2015.  Patient to return  for labs, flush, visit, and chemotherapy again on Monday, 01/08/2016.       Patient stated understanding of all instructions; and was in agreement with this plan of care. The patient knows to call the clinic with any problems, questions or concerns.   Review/collaboration with [name]  regarding all aspects of patient's visit today.   Total time spent with patient was 15 minutes;  with greater than 75 percent of that time spent in face to face counseling regarding patient's symptoms,  and coordination of care and follow up.  Disclaimer:This dictation was prepared with Dragon/digital dictation along with [employer]. Any transcriptional errors that result from this process are unintentional.  Drue Second, NP 01/02/2016

## 2016-01-08 ENCOUNTER — Ambulatory Visit (HOSPITAL_BASED_OUTPATIENT_CLINIC_OR_DEPARTMENT_OTHER): Payer: PPO | Admitting: Hematology

## 2016-01-08 ENCOUNTER — Ambulatory Visit (HOSPITAL_BASED_OUTPATIENT_CLINIC_OR_DEPARTMENT_OTHER): Payer: PPO

## 2016-01-08 ENCOUNTER — Telehealth: Payer: Self-pay | Admitting: Hematology

## 2016-01-08 ENCOUNTER — Other Ambulatory Visit (HOSPITAL_BASED_OUTPATIENT_CLINIC_OR_DEPARTMENT_OTHER): Payer: PPO

## 2016-01-08 ENCOUNTER — Encounter: Payer: Self-pay | Admitting: Hematology

## 2016-01-08 ENCOUNTER — Telehealth: Payer: Self-pay | Admitting: *Deleted

## 2016-01-08 ENCOUNTER — Ambulatory Visit: Payer: PPO

## 2016-01-08 VITALS — BP 121/71 | HR 97 | Temp 98.2°F | Resp 18

## 2016-01-08 VITALS — BP 114/73 | HR 97 | Temp 98.4°F | Resp 18 | Ht 65.0 in | Wt 138.9 lb

## 2016-01-08 DIAGNOSIS — E46 Unspecified protein-calorie malnutrition: Secondary | ICD-10-CM

## 2016-01-08 DIAGNOSIS — D6481 Anemia due to antineoplastic chemotherapy: Secondary | ICD-10-CM | POA: Diagnosis not present

## 2016-01-08 DIAGNOSIS — Z5112 Encounter for antineoplastic immunotherapy: Secondary | ICD-10-CM

## 2016-01-08 DIAGNOSIS — D500 Iron deficiency anemia secondary to blood loss (chronic): Secondary | ICD-10-CM | POA: Diagnosis not present

## 2016-01-08 DIAGNOSIS — C7800 Secondary malignant neoplasm of unspecified lung: Secondary | ICD-10-CM

## 2016-01-08 DIAGNOSIS — C187 Malignant neoplasm of sigmoid colon: Secondary | ICD-10-CM

## 2016-01-08 DIAGNOSIS — B1910 Unspecified viral hepatitis B without hepatic coma: Secondary | ICD-10-CM

## 2016-01-08 DIAGNOSIS — K254 Chronic or unspecified gastric ulcer with hemorrhage: Secondary | ICD-10-CM

## 2016-01-08 DIAGNOSIS — L270 Generalized skin eruption due to drugs and medicaments taken internally: Secondary | ICD-10-CM

## 2016-01-08 DIAGNOSIS — D509 Iron deficiency anemia, unspecified: Secondary | ICD-10-CM

## 2016-01-08 DIAGNOSIS — C772 Secondary and unspecified malignant neoplasm of intra-abdominal lymph nodes: Secondary | ICD-10-CM

## 2016-01-08 DIAGNOSIS — C787 Secondary malignant neoplasm of liver and intrahepatic bile duct: Secondary | ICD-10-CM | POA: Diagnosis not present

## 2016-01-08 DIAGNOSIS — C189 Malignant neoplasm of colon, unspecified: Secondary | ICD-10-CM

## 2016-01-08 DIAGNOSIS — E119 Type 2 diabetes mellitus without complications: Secondary | ICD-10-CM

## 2016-01-08 DIAGNOSIS — Z95828 Presence of other vascular implants and grafts: Secondary | ICD-10-CM

## 2016-01-08 DIAGNOSIS — C7972 Secondary malignant neoplasm of left adrenal gland: Secondary | ICD-10-CM | POA: Diagnosis not present

## 2016-01-08 DIAGNOSIS — I10 Essential (primary) hypertension: Secondary | ICD-10-CM

## 2016-01-08 DIAGNOSIS — I5042 Chronic combined systolic (congestive) and diastolic (congestive) heart failure: Secondary | ICD-10-CM

## 2016-01-08 LAB — CBC WITH DIFFERENTIAL/PLATELET
BASO%: 0.4 % (ref 0.0–2.0)
BASOS ABS: 0 10*3/uL (ref 0.0–0.1)
EOS ABS: 0.3 10*3/uL (ref 0.0–0.5)
EOS%: 5.5 % (ref 0.0–7.0)
HEMATOCRIT: 27.2 % — AB (ref 38.4–49.9)
HGB: 9.1 g/dL — ABNORMAL LOW (ref 13.0–17.1)
LYMPH#: 0.8 10*3/uL — AB (ref 0.9–3.3)
LYMPH%: 14.7 % (ref 14.0–49.0)
MCH: 28.8 pg (ref 27.2–33.4)
MCHC: 33.5 g/dL (ref 32.0–36.0)
MCV: 86.1 fL (ref 79.3–98.0)
MONO#: 0.7 10*3/uL (ref 0.1–0.9)
MONO%: 13.3 % (ref 0.0–14.0)
NEUT%: 66.1 % (ref 39.0–75.0)
NEUTROS ABS: 3.4 10*3/uL (ref 1.5–6.5)
Platelets: 122 10*3/uL — ABNORMAL LOW (ref 140–400)
RBC: 3.16 10*6/uL — AB (ref 4.20–5.82)
RDW: 17 % — AB (ref 11.0–14.6)
WBC: 5.1 10*3/uL (ref 4.0–10.3)
nRBC: 0 % (ref 0–0)

## 2016-01-08 LAB — COMPREHENSIVE METABOLIC PANEL
ALT: 30 U/L (ref 0–55)
ANION GAP: 7 meq/L (ref 3–11)
AST: 32 U/L (ref 5–34)
Albumin: 3 g/dL — ABNORMAL LOW (ref 3.5–5.0)
Alkaline Phosphatase: 185 U/L — ABNORMAL HIGH (ref 40–150)
BILIRUBIN TOTAL: 0.5 mg/dL (ref 0.20–1.20)
BUN: 12.6 mg/dL (ref 7.0–26.0)
CHLORIDE: 104 meq/L (ref 98–109)
CO2: 25 meq/L (ref 22–29)
Calcium: 8.4 mg/dL (ref 8.4–10.4)
Creatinine: 0.7 mg/dL (ref 0.7–1.3)
GLUCOSE: 110 mg/dL (ref 70–140)
POTASSIUM: 3.8 meq/L (ref 3.5–5.1)
SODIUM: 136 meq/L (ref 136–145)
TOTAL PROTEIN: 7 g/dL (ref 6.4–8.3)

## 2016-01-08 LAB — IRON AND TIBC
%SAT: 18 % — AB (ref 20–55)
IRON: 34 ug/dL — AB (ref 42–163)
TIBC: 190 ug/dL — ABNORMAL LOW (ref 202–409)
UIBC: 156 ug/dL (ref 117–376)

## 2016-01-08 LAB — MAGNESIUM: Magnesium: 0.8 mg/dl — CL (ref 1.5–2.5)

## 2016-01-08 MED ORDER — SODIUM CHLORIDE 0.9 % IV SOLN
Freq: Once | INTRAVENOUS | Status: DC
Start: 2016-01-08 — End: 2016-01-08

## 2016-01-08 MED ORDER — IRINOTECAN HCL CHEMO INJECTION 100 MG/5ML
160.0000 mg/m2 | Freq: Once | INTRAVENOUS | Status: DC
  Filled 2016-01-08: qty 13.6

## 2016-01-08 MED ORDER — SODIUM CHLORIDE 0.9 % IV SOLN
Freq: Once | INTRAVENOUS | Status: AC
  Administered 2016-01-08: 12:00:00 via INTRAVENOUS

## 2016-01-08 MED ORDER — PANITUMUMAB CHEMO INJECTION 100 MG/5ML
6.2000 mg/kg | Freq: Once | INTRAVENOUS | Status: AC
  Administered 2016-01-08: 400 mg via INTRAVENOUS
  Filled 2016-01-08: qty 20

## 2016-01-08 MED ORDER — SODIUM CHLORIDE 0.9 % IV SOLN
6.0000 g | Freq: Once | INTRAVENOUS | Status: AC
  Administered 2016-01-08: 6 g via INTRAVENOUS
  Filled 2016-01-08: qty 12

## 2016-01-08 MED ORDER — SODIUM CHLORIDE 0.9 % IJ SOLN
10.0000 mL | INTRAMUSCULAR | Status: DC | PRN
  Administered 2016-01-08: 10 mL
  Filled 2016-01-08: qty 10

## 2016-01-08 MED ORDER — ATROPINE SULFATE 1 MG/ML IJ SOLN: 0.5000 mg | Freq: Once | INTRAMUSCULAR | Status: DC | PRN

## 2016-01-08 MED ORDER — HEPARIN SOD (PORK) LOCK FLUSH 100 UNIT/ML IV SOLN
500.0000 [IU] | Freq: Once | INTRAVENOUS | Status: AC | PRN
  Administered 2016-01-08: 500 [IU]
  Filled 2016-01-08: qty 5

## 2016-01-08 MED ORDER — SODIUM CHLORIDE 0.9% FLUSH
10.0000 mL | INTRAVENOUS | Status: DC | PRN
  Administered 2016-01-08: 10 mL via INTRAVENOUS
  Filled 2016-01-08: qty 10

## 2016-01-08 MED ORDER — SODIUM CHLORIDE 0.9 % IV SOLN
Freq: Once | INTRAVENOUS | Status: DC
  Filled 2016-01-08: qty 4

## 2016-01-08 NOTE — Progress Notes (Signed)
Due to late start timer pt is to receive just magnesium and vectibix today and then return on 01/09/16 @ 8:30 am for irinotecan. Pt voiced understanding and was given paper copy of schedule.

## 2016-01-08 NOTE — Progress Notes (Signed)
Pt labs reviewed by Dr.Feng. Okay to give Vectibix with magnesium 0.8.  1200pm- Pt refused to stay until tonight to finish chemo treatment. Notified [name] and ok for pt to come back for other chemo tomorrow. Pt will get magnesium and vectibix today and camptosar tomorrow. Notified scheduler to put a time infusion for pt tomorrow. Notified pharmacy of plan as well and is aware.

## 2016-01-08 NOTE — Patient Instructions (Signed)

## 2016-01-08 NOTE — Patient Instructions (Addendum)
[facility] Discharge Instructions for Patients Receiving Chemotherapy  Today you received the following chemotherapy:/Vectibix/Magnesium.  To help prevent nausea and vomiting after your treatment, we encourage you to take your nausea medication as prescribed.   If you develop nausea and vomiting that is not controlled by your nausea medication, call the clinic.   BELOW ARE SYMPTOMS THAT SHOULD BE REPORTED IMMEDIATELY:  *FEVER GREATER THAN 100.5 F  *CHILLS WITH OR WITHOUT FEVER  NAUSEA AND VOMITING THAT IS NOT CONTROLLED WITH YOUR NAUSEA MEDICATION  *UNUSUAL SHORTNESS OF BREATH  *UNUSUAL BRUISING OR BLEEDING  TENDERNESS IN MOUTH AND THROAT WITH OR WITHOUT PRESENCE OF ULCERS  *URINARY PROBLEMS  *BOWEL PROBLEMS  UNUSUAL RASH Items with * indicate a potential emergency and should be followed up as soon as possible.  Feel free to call the clinic you have any questions or concerns. The clinic phone number is [phone].  Please show the [place] at check-in to the Emergency Department and triage nurse.  Hypomagnesemia Hypomagnesemia is a condition in which the level of magnesium in the blood is low. Magnesium is a mineral that is found in many foods. It is used in many different processes in the body. Hypomagnesemia can affect every organ in the body. It can cause life-threatening problems. CAUSES Causes of hypomagnesemia include:  Not getting enough magnesium in your diet.  Malnutrition.  Problems with absorbing magnesium from the intestines.  Dehydration.  Alcohol abuse.  Vomiting.  Severe diarrhea.  Some medicines, including medicines that make you urinate more.  Certain diseases, such as kidney disease, diabetes, and overactive thyroid. SIGNS AND SYMPTOMS  Involuntary shaking or trembling of a body part (tremor).  Confusion.  Muscle weakness.  Sensitivity to light, sound, and touch.  Psychiatric issues, such as depression,  irritability, or psychosis.  Sudden tightening of muscles (muscle spasms).  Tingling in the arms and legs.  A feeling of fluttering of the heart. These symptoms are more severe if magnesium levels drop suddenly. DIAGNOSIS To make a diagnosis, your health care provider will do a physical exam and order blood and urine tests. TREATMENT Treatment will depend on the cause and the severity of your condition. It may involve:  A magnesium supplement. This can be taken in pill form. It can also be given through an IV tube. This is usually done if the condition is severe.  Changes to your diet. You may be directed to eat foods that have a lot of magnesium, such as green leafy vegetables, peas, beans, and nuts.  Eliminating alcohol from your diet. HOME CARE INSTRUCTIONS  Include foods with magnesium in your diet. Foods that are rich in magnesium include green vegetables, beans, nuts and seeds, and whole grains.  Take medicines only as directed by your health care provider.  Take magnesium supplements if your health care provider instructs you to do that. Take them as directed.  Have your magnesium levels monitored as directed by your health care provider.  When you are active, drink fluids that contain electrolytes.  Keep all follow-up visits as directed by your health care provider. This is important. SEEK MEDICAL CARE IF:  You get worse instead of better.  Your symptoms return. SEEK IMMEDIATE MEDICAL CARE IF:  Your symptoms are severe.   This information is not intended to replace advice given to you by your health care provider. Make sure you discuss any questions you have with your health care provider.   Document Released: 07/10/2005 Document Revised: 11/04/2014  Document Reviewed: 05/30/2014 Elsevier Interactive Patient Education [employer].

## 2016-01-08 NOTE — Telephone Encounter (Signed)
per po to sch pt appt-gave pt copy of avs °

## 2016-01-08 NOTE — Telephone Encounter (Signed)
Per staff message and POF I have scheduled appts. Advised scheduler of appts. And to move lab/flush  JMW

## 2016-01-08 NOTE — Progress Notes (Signed)
[facility]  Telephone:([phone] [phone]  Clinic follow up Note   Patient Care Team: [name], MD as PCP - General (Specialist) [name], CRNP as Nurse Practitioner (Nurse Practitioner) Truitt Merle, MD as Consulting Physician (Hematology) Ladene Artist, MD as Consulting Physician (Gastroenterology) Tobi Bastos, RN as Lithium Management 01/08/2016  CHIEF COMPLAINTS Follow up metastatic sigmoid colon cancer  Oncology History   He is a Metastatic colon cancer to liver   Staging form: Colon and Rectum, AJCC 7th Edition     Clinical: Stage Unknown ([place], NX, M1) - Unsigned      Cancer of sigmoid colon ([place])   10/28/2014 Tumor Marker AFP 3.2 CEA > 10,000 CA 19-9 12,929.6. tumor (-) KRAS and NRAS mutation.    11/10/2014 Imaging PET scan showed hypermetabolic mass in the sigmoid colon was noted metastasis in the retroperitoneum. Probable left adrenal and pulmonary metastasis, and diffuse liver metastasis.   11/21/2014 Initial Diagnosis Metastatic colon cancer to liver, lung, abd nodes and left adrenal gland. Diagnosis was made by liver biopsy.    11/30/2014 -  Chemotherapy First line chemo [id number], Panitumumab added from second cycle    12/28/2014 - 12/31/2014 Hospital Admission Was admitted for dehydration, neutropenia fever with UTI, and severe skin rash.   02/08/2015 - 02/12/2015 Hospital Admission He was admitted for upper GI bleeding, e.g. showed a gastric ulcer with clots, status post appendectomy injection. He also received a blood transfusion.   03/01/2015 Tumor Marker CEA 694, CA19.9 462   03/13/2015 Imaging CT CAP showed partial response, no new lesions.    03/15/2015 - 05/23/2015 Chemotherapy restart FOLFOX, held again on 8/9 due to GI bleeding    05/31/2015 - 06/02/2015 Hospital Admission He was admitted to [facility] in [place] due to upper GI bleeding, EGD showed gastric and dudenal ulcers    06/27/2015 - 06/29/2015 Hospital  Admission he was admitted for dairrhea and pancolitis, c-diff and stool cultures were negative, treated with antibiotics   07/12/2015 -  Chemotherapy panitumumab 67m/kg, every 2 weeks   11/08/2015 - 11/10/2015 Hospital Admission Pt was admitted for sepsis and hypotension, was found to have (+) influenza A and treated.    11/30/2015 Imaging  CT scans reviewed continued improvement in the liver metastasis, no other new lesions.     HISTORY OF PRESENTING ILLNESS:  Jason Reilly 70y.o. male is here because of abnormal CT findings, which is very suspicious for malignancy. [place] He came in with his son and an interpreter.  He has been feeling fatigued since two month ago. He is still able to do all ADLs. He otherwise denies any pain, bloating or nausea.  He lost about 20lbs in 3 month. His appetite is lower than before, eats less, no change of his bowl habits.  She denied any hematochezia or melana. Per his son, he has had some personality changes daily, irritable, slightly confused some time.  He was evaluated by his primary care physician. Lab test reviewed hepatitis B infection, which he did not know before, and elevated alkaline phosphatase, his liver function and the rest of the liver function was not remarkable. [place] was obtained on 07/22/2014, which showed diffusely abnormal liver with multiple echogenic lesions. CT of abdomen with and without contrast was done on 08/26/2014, which reviewed here at a medically with multiple large partially calcified hepatic masses consistent with metastatic  disease. Mild retroperitoneal adenopathy with the largest node measuring 1.6 cm. And nonspecific 1.4 cm left adrenal nodule was also noticed. His tumor marker showed CEA greater than 10,000, CA 19-9 12,929, AFP 3.2 (normal). He was referred to [facility] liver clinic and was evaluated by nurse practitioner [name]. Treatment for  hepatitis B was not recommended based on his virus load.  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 He was evaluated by a cardiologist in 2014. He denies any significant dyspnea on exertion. No leg swollen.  CURRENT THERAPY: panitumumab 40m/kg, every 2 weeks, started on 07/12/2015, Irinotecan 1863mm2 every 2 weeks added on 09/19/2015, dose decreased to 16054m2 from cycle 2   INTERIM HISTORY[place] and chemotherapy. He is accompanied by his son and interpreter to the clinic today. He is doing better overall. His diarrhea has resolved, no melena or hematochezia, he has good appetite and eats well. His energy level has improved, he is able tolerate her routine activities without much difficulty. He denies any fever or chills, no other new complaints.   MEDICAL HISTORY:  Past Medical History  Diagnosis Date  . Tachycardia   . Abnormal EKG   . Hypertension   . Non-ischemic cardiomyopathy ([place] . Hepatitis B   . H. pylori infection   . Gastric ulcer   . Metastatic colon cancer to liver ([place] . Diabetes mellitus without complication ([place]   metformin    SURGICAL HISTORY: Past Surgical History  Procedure Laterality Date  . Nuclear stress test  03/03/2013    High risk - consistent with nonischemic cardiomyopathy  . Left and right heart catheterization with coronary angiogram N/A 03/29/2013    Procedure: LEFT AND RIGHT HEART CATHETERIZATION WITH CORONARY ANGIOGRAM;  Surgeon: KenPixie CasinoD;  [facility];  Service: Cardiovascular;  Laterality: N/A;  . Esophagogastroduodenoscopy N/A 02/08/2015    Procedure: ESOPHAGOGASTRODUODENOSCOPY (EGD);  Surgeon: MalLadene ArtistD;  [facility];  Service: Endoscopy;  Laterality: N/A;  . Esophagogastroduodenoscopy (egd) with propofol N/A 08/15/2015    Procedure: ESOPHAGOGASTRODUODENOSCOPY (EGD) WITH PROPOFOL;  Surgeon: MalLadene ArtistD;  Location: [facility];   Service: Endoscopy;  Laterality: N/A;    SOCIAL HISTORY: Social History   Social History  . Marital Status: Married    Spouse Name: N/A  . Number of Children: N/A  . Years of Education: N/A   Occupational History  . Not on file.   Social History Main Topics  . Smoking status: Former Smoker    Types: Cigarettes    Quit date: 11/07/2008  . Smokeless tobacco: Never Used  . Alcohol Use: Yes     Comment: occasional  . Drug Use: No  . Sexual Activity: No   Other Topics Concern  . Not on file   Social History Narrative   Married   Enjoys walking   [place] lived [place]    FAMILY HISTORY: No family history of liver disease or malignancy.  ALLERGIES:  has No Known Allergies.  MEDICATIONS:  Current Outpatient Prescriptions on File Prior to Visit  Medication Sig Dispense Refill  . clindamycin (CLINDAGEL) 1 % gel Apply topically 2 (two) times daily. 30 g 2  . folic acid (FOLVITE) 1 MG tablet Take 1 tablet (1 mg total) by mouth daily. 30 tablet 3  . hydrocortisone 1 % ointment Apply 1 application topically 2 (two) times daily. 56 g 0  .  lidocaine-prilocaine (EMLA) cream Apply 1 application topically as needed. Apply to [facility] cath at least one hour before needle stick. 30 g 2  . loperamide (IMODIUM) 2 MG capsule Take 2 capsules (4 mg total) by mouth 4 (four) times daily as needed for diarrhea or loose stools (NO MORE THAN 8 TABLETS PER DAY.). 60 capsule 3  . magic mouthwash SOLN Take 5 mLs by mouth 4 (four) times daily. (Patient not taking: Reported on 12/25/2015) 120 mL 1  . magnesium oxide (MAG-OX) 400 (241.3 Mg) MG tablet Take 1 tablet (400 mg total) by mouth 3 (three) times daily. 60 tablet 1  . metFORMIN (GLUCOPHAGE) 500 MG tablet Take 1 tablet (500 mg total) by mouth 2 (two) times daily with a meal. THIS IS A ONE TIME ORDER.  FUTURE REFILLS NEED TO BE DONE BY PRIMARY MD. 90 tablet 0  . nadolol (CORGARD) 20 MG tablet Take 1 tablet (20 mg total) by mouth 2 (two) times  daily. THIS IS A ONE TIME ORDER.  FUTURE REFILLS NEED TO BE DONE BY PRIMARY MD. 90 tablet 0  . ondansetron (ZOFRAN) 8 MG tablet Take 1 tablet (8 mg total) by mouth every 8 (eight) hours as needed for nausea or vomiting. (Patient not taking: Reported on 12/25/2015) 45 tablet 2  . pantoprazole (PROTONIX) 40 MG tablet Take 1 tablet (40 mg total) by mouth daily. 30 tablet 2  . prochlorperazine (COMPAZINE) 10 MG tablet Take 1 tablet (10 mg total) by mouth every 6 (six) hours as needed for nausea or vomiting. (Patient not taking: Reported on 12/25/2015) 30 tablet 1   Current Facility-Administered Medications on File Prior to Visit  Medication Dose Route Frequency Provider Last Rate Last Dose  . sodium chloride 0.9 % injection 10 mL  10 mL Intracatheter PRN Truitt Merle, MD   10 mL at 10/03/15 1705  ;   REVIEW OF SYSTEMS:   Constitutional: Denies fevers, chills or abnormal night sweats, (+) fatigue  Eyes: Denies blurriness of vision, double vision or watery eyes Ears, nose, mouth, throat, and face: Denies mucositis or sore throat Respiratory: Denies cough, dyspnea or wheezes Cardiovascular: Denies palpitation, chest discomfort or lower extremity swelling Gastrointestinal: Denies nausea, heartburn or change in bowel habits Skin: Denies abnormal skin rashes Lymphatics: Denies new lymphadenopathy or easy bruising Neurological:Denies numbness, tingling or new weaknesses Behavioral/Psych: Mood is stable, no new changes, (+) insomnia All other systems were reviewed with the patient and are negative.  PHYSICAL EXAMINATION: BP 114/73 mmHg  Pulse 97  Temp(Src) 98.4 F (36.9 C) (Oral)  Resp 18  Ht '5\' 5"'  (1.651 m)  Wt 138 lb 14.4 oz (63.005 kg)  BMI 23.11 kg/m2  SpO2 100%  ECOG PERFORMANCE STATUS: 1 Vital sign were taken at in the infusion room, within normal limits. GENERAL:alert, no distress and comfortable SKIN: (+) Dry skin,  no skin ulcer, (+) diffuse skin rashes on his neck and upper chest, some  are resolving with skin pigmentation. No discharge. EYES: normal, conjunctiva are pink and non-injected, sclera clear OROPHARYNX:no exudate, no erythema and lips, buccal mucosa, and tongue with mild discoloration at the tip.  NECK: supple, thyroid normal size, non-tender, without nodularity LYMPH:  no palpable lymphadenopathy in the cervical, axillary or inguinal LUNGS: clear to auscultation and percussion with normal breathing effort, no rales   HEART: regular rate & rhythm and no murmurs and no lower extremity edema ABDOMEN:abdomen soft, non-tender, no hepatomegaly, no splenomegaly and normal bowel sounds Musculoskeletal:no cyanosis of digits and no clubbing  PSYCH: alert & oriented x 3 with fluent speech NEURO: no focal motor/sensory deficits  LABORATORY DATA:  I have reviewed the data as listed CBC Latest Ref Rng 01/08/2016 01/01/2016 12/25/2015  WBC 4.0 - 10.3 10e3/uL 5.1 6.4 6.4  Hemoglobin 13.0 - 17.1 g/dL 9.1(L) 10.4(L) 10.4(L)  Hematocrit 38.4 - 49.9 % 27.2(L) 31.7(L) 31.3(L)  Platelets 140 - 400 10e3/uL 122(L) 170 207   ANC is 3.4  CMP Latest Ref Rng 01/08/2016 01/01/2016 12/25/2015  Glucose 70 - 140 mg/dl 110 144(H) 107  BUN 7.0 - 26.0 mg/dL 12.6 14.2 8.5  Creatinine 0.7 - 1.3 mg/dL 0.7 0.7 0.7  Sodium 136 - 145 mEq/L 136 136 138  Potassium 3.5 - 5.1 mEq/L 3.8 4.4 4.5  CO2 22 - 29 mEq/L '25 24 27  ' Calcium 8.4 - 10.4 mg/dL 8.4 8.6 7.7(L)  Total Protein 6.4 - 8.3 g/dL 7.0 7.2 7.1  Total Bilirubin 0.20 - 1.20 mg/dL 0.50 0.60 0.39  Alkaline Phos 40 - 150 U/L 185(H) 135 160(H)  AST 5 - 34 U/L 32 29 32  ALT 0 - 55 U/L 30 39 23   Mag 0.8 today   INITIAL tumor markers AFP 3.2 CEA > 10,000 CA 19-9 12,929.6  Results for JULIAS, MOULD (MRN [id number]) as of 12/18/2015 07:29  Ref. Range 08/09/2015 08:40 09/06/2015 09:57 10/03/2015 08:49 10/31/2015 08:01  CA 19-9 Latest Ref Range: <35.0 U/mL 49.7 (H) 53.7 (H) 62.3 (H) 40.7 (H)  CEA Latest Ref Range: 0.0-5.0 ng/mL 96.3 (H) 73.7 (H) 66.4 (H)  50.6 (H)     Pathology report  Liver, needle/core biopsy - METASTATIC ADENOCARCINOMA, SEE COMMENT. Microscopic Comment The adenocarcinoma demonstrates the following immunophenotype: Cytokeratin 7 - negative expression. Cytokeratin 20 - strong diffuse expression. CD2 - strong diffuse expression. Overall the morphology and immunophenotype are that of metastatic adenocarcinoma primary to colorectum. The recent nuclear medicine scan demonstrating sigmoid mass with associated liver masses is noted.  [facility] test result:    RADIOGRAPHIC STUDIES: I have personally reviewed the outside CT scan image with patient and his son.   CT chest, abdomen and pelvis with IV contrast oN  11/30/2015 IMPRESSION: Mild residual wall thickening involving the distal descending/ proximal sigmoid colon, possibly reflecting a combination residual tumor and/or inflammatory changes.  Improving hepatic metastases, with index lesions as above. Right adrenal metastases, unchanged. Mild abdominopelvic nodal metastases, grossly unchanged.  No evidence of metastatic disease in the chest.  ASSESSMENT & PLAN:  70 year old [place] male, with past history of hypertension and dilated nonischemic gammopathy with EF 25%, no clinical signs of heart failure, who was found to have hepatitis B infection lately, and multiple liver lesions on the CT scan. He has extremely high CEA and CA 19-9 levels. PET scan reviewed a hypermetabolic sigmoid colon mass, diffuse liver metastasis, probable lung and adrenal gland metastasis.  1. Metastatic sigmoid colon cancer, with diffuse liver, lungs, node and left adrenal gland metastases. KRAS/NRAS wild type, MSI-stable -Liver biopsy showed metastatic adenocarcinoma. His tumor were strongly positive for CK20 and CD2, consistent with primary colorectal primary. KRAS and NRAS mutations were not detected.  -Pt understands that this is incurable cancer, and he has very high disease burden  and overall prognosis is poor. The treatment goal is palliative -He had excellent response to first-line FOLFOX, but treatment was complicated with GI bleeding and neutropenic fever, resolved now. -he is currently on second line chemotherapy with irritecan and panitumumab, tolerating well  -His CEA has come down again, he is likely responding to  treatment well - I reviewed his restaging CT scan from 11/30/2015, which showed a continuous responding in liver metastasis, no other new lesions. -His clinical doing well, lab reviewed, adequate for treatment, we'll continue chemotherapy today.  2. Grade 1-2 skin rashes, improved  -Secondary to panitumumab, improved and stable lately  -continue hydrocortisone 2.5%, and clindamycin gel 1%  twice daily as needed  -He knows to avoid sun exposure, and call me if it gets worse.  3. Gastric ulcer with significant GI bleeding in April and Aug 2016 -He is on PPI, continue once daily  -Repeat his EGD on 08/15/2015 showed near complete healing of his gastric ulcer -continue Nadolol 87m bid, per Dr. SFuller Plan- I encourage him to follow-up with Dr. SFuller Plan 4. Poorly controlled diabetes -HbA1c was 8.5, blood glucose not well controlled  -I encourage him to follow-up with his primary care physician Dr. WJimmye [place] 5. HTN, Dilated nonischemic ischemia cardiomyopathy with EF 25% -He is clinically doing well without symptoms of CHF. However this is probably going to impact his chemotherapy.Will try to avoid cardiotoxic chemotherapy agent and avoid fluid overload during chemotherapy. -Continue follow-up with cardiology.  6 Hepatitis B carrier, with mild portal hypertension  -Per liver clinic, no need for treatment. Follow-up with Dr. SSilvio Pate  7. Malnutrition -I encouraged him to eat more, and take supplements as needed. -follow up with Dietitian   8. Anemia secondary to GI bleeding, iron deficiency and chemo  -Repeat lab on 06/06/2015 showed ferritin 117, serum  iron 24, saturation 8%, which supports iron deficiency -He received IV Feraheme again in Aug 2016 after GI bleeding  -Repeat a ferritin was 273 on 10/03/2015, much improved  -he received iv feraheme again on 11/27/2015 and 12/04/2015, but anemia did not improve much. -His anemia has been getting worse lately, probably related to chemotherapy, we'll close monitor any signs of GI bleeding. -Blood transfusion if hemoglobin less than 8  11. hypomagnesemia  -secondary to panitumumab -he receives IV mag 6 g every 1-2 weeks -He is taking magnesium pill 2 tablets 3 times a day, we'll hold it for now due to his diarrhea -follow up weekly   12. Diarrhea -Secondary to chemotherapy. Resolved now. -I encouraged him to use Imodium if he has diarrhea after chemotherapy   Plan -Lab results reviewed, adequate for treatment, we'll proceed chemotherapy today -Return in 1 week for lab and magnesium infusion -I'll see him back in 2 weeks   All questions were answered. The patient knows to call the clinic with any problems, questions or concerns.  I spent 25 minutes counseling the patient face to face. The total time spent in the appointment was 30 minutes and more than 50% was on counseling.     FTruitt Merle MD 01/08/2016

## 2016-01-09 ENCOUNTER — Ambulatory Visit (HOSPITAL_BASED_OUTPATIENT_CLINIC_OR_DEPARTMENT_OTHER): Payer: PPO

## 2016-01-09 VITALS — BP 135/75 | HR 88 | Temp 97.2°F | Resp 18

## 2016-01-09 DIAGNOSIS — Z5111 Encounter for antineoplastic chemotherapy: Secondary | ICD-10-CM

## 2016-01-09 DIAGNOSIS — C187 Malignant neoplasm of sigmoid colon: Secondary | ICD-10-CM | POA: Diagnosis not present

## 2016-01-09 DIAGNOSIS — C7800 Secondary malignant neoplasm of unspecified lung: Secondary | ICD-10-CM

## 2016-01-09 DIAGNOSIS — C787 Secondary malignant neoplasm of liver and intrahepatic bile duct: Secondary | ICD-10-CM

## 2016-01-09 DIAGNOSIS — C7972 Secondary malignant neoplasm of left adrenal gland: Secondary | ICD-10-CM | POA: Diagnosis not present

## 2016-01-09 DIAGNOSIS — C772 Secondary and unspecified malignant neoplasm of intra-abdominal lymph nodes: Secondary | ICD-10-CM

## 2016-01-09 LAB — CEA (PARALLEL TESTING): CEA: 80.3 ng/mL — ABNORMAL HIGH

## 2016-01-09 LAB — CEA: CEA: 110.6 ng/mL — ABNORMAL HIGH (ref 0.0–4.7)

## 2016-01-09 MED ORDER — SODIUM CHLORIDE 0.9 % IV SOLN
Freq: Once | INTRAVENOUS | Status: AC
  Administered 2016-01-09: 09:00:00 via INTRAVENOUS

## 2016-01-09 MED ORDER — IRINOTECAN HCL CHEMO INJECTION 100 MG/5ML
165.0000 mg/m2 | Freq: Once | INTRAVENOUS | Status: AC
  Administered 2016-01-09: 280 mg via INTRAVENOUS
  Filled 2016-01-09: qty 10

## 2016-01-09 MED ORDER — SODIUM CHLORIDE 0.9 % IJ SOLN
10.0000 mL | INTRAMUSCULAR | Status: DC | PRN
  Administered 2016-01-09: 10 mL
  Filled 2016-01-09: qty 10

## 2016-01-09 MED ORDER — ATROPINE SULFATE 1 MG/ML IJ SOLN
INTRAMUSCULAR | Status: AC
  Filled 2016-01-09: qty 1

## 2016-01-09 MED ORDER — DEXAMETHASONE SODIUM PHOSPHATE 100 MG/10ML IJ SOLN
Freq: Once | INTRAMUSCULAR | Status: AC
  Administered 2016-01-09: 09:00:00 via INTRAVENOUS
  Filled 2016-01-09: qty 4

## 2016-01-09 MED ORDER — ATROPINE SULFATE 1 MG/ML IJ SOLN
0.5000 mg | Freq: Once | INTRAMUSCULAR | Status: AC | PRN
  Administered 2016-01-09: 0.5 mg via INTRAVENOUS

## 2016-01-09 MED ORDER — HEPARIN SOD (PORK) LOCK FLUSH 100 UNIT/ML IV SOLN
500.0000 [IU] | Freq: Once | INTRAVENOUS | Status: AC | PRN
  Administered 2016-01-09: 500 [IU]
  Filled 2016-01-09: qty 5

## 2016-01-09 NOTE — Patient Instructions (Signed)
[facility] Discharge Instructions for Patients Receiving Chemotherapy  Today you received the following chemotherapy agents Irinotecan  To help prevent nausea and vomiting after your treatment, we encourage you to take your nausea medication as directed.    If you develop nausea and vomiting that is not controlled by your nausea medication, call the clinic.   BELOW ARE SYMPTOMS THAT SHOULD BE REPORTED IMMEDIATELY:  *FEVER GREATER THAN 100.5 F  *CHILLS WITH OR WITHOUT FEVER  NAUSEA AND VOMITING THAT IS NOT CONTROLLED WITH YOUR NAUSEA MEDICATION  *UNUSUAL SHORTNESS OF BREATH  *UNUSUAL BRUISING OR BLEEDING  TENDERNESS IN MOUTH AND THROAT WITH OR WITHOUT PRESENCE OF ULCERS  *URINARY PROBLEMS  *BOWEL PROBLEMS  UNUSUAL RASH Items with * indicate a potential emergency and should be followed up as soon as possible.  Feel free to call the clinic you have any questions or concerns. The clinic phone number is [phone].  Please show the CHEMO ALERT CARD at check-in to the Emergency Department and triage nurse.   

## 2016-01-11 ENCOUNTER — Other Ambulatory Visit: Payer: Self-pay | Admitting: *Deleted

## 2016-01-11 NOTE — Patient Outreach (Signed)
[facility]) Care Management  01/11/2016  Jason Reilly Mar 12, 1946 [id number]  Initial outreach call to [name] pt with interpreter. Interpreter used today from [employer] Ninfa Linden [phone]). RN requested all contact number be call however unsuccessful as RN requested interpreter to leave a HIPAA approved voice message indicating RN would continue outreach attempts and follow up once again on Monday.   Raina Mina, RN Care Management Coordinator [place] Office [phone]

## 2016-01-12 ENCOUNTER — Encounter: Payer: Self-pay | Admitting: *Deleted

## 2016-01-12 NOTE — Progress Notes (Signed)
[facility] Healthcare Advance Directives Clinical Social Work  Clinical Social Work was referred by RN to review and complete healthcare advance directives.  Clinical Social Worker met with patient and son in CSW office.  The patient designated son, [name], as their primary healthcare agent and daughter [name] as their secondary agent.  CSW reviewed healthcare living will with patient.  [name] was unable to decide in which instances he would prefer no life prolonging measures.  The patient requested his children make decisions about end of life care if he is unable to communicate for himself.  Patient did not complete living will.    Clinical Social Worker notarized documents and made copies for patient/family. Clinical Social Worker will send documents to medical records to be scanned into patient's chart. Clinical Social Worker encouraged patient/family to contact with any additional questions or concerns.  Lauren Mullis, MSW, LCSW Clinical Social Worker [facility] [phone]        

## 2016-01-15 ENCOUNTER — Other Ambulatory Visit: Payer: Self-pay | Admitting: *Deleted

## 2016-01-15 ENCOUNTER — Ambulatory Visit (HOSPITAL_BASED_OUTPATIENT_CLINIC_OR_DEPARTMENT_OTHER): Payer: PPO

## 2016-01-15 ENCOUNTER — Other Ambulatory Visit: Payer: PPO

## 2016-01-15 ENCOUNTER — Ambulatory Visit: Payer: PPO

## 2016-01-15 ENCOUNTER — Other Ambulatory Visit (HOSPITAL_BASED_OUTPATIENT_CLINIC_OR_DEPARTMENT_OTHER): Payer: PPO

## 2016-01-15 VITALS — BP 120/76 | HR 87 | Temp 97.4°F

## 2016-01-15 DIAGNOSIS — D509 Iron deficiency anemia, unspecified: Secondary | ICD-10-CM

## 2016-01-15 DIAGNOSIS — C187 Malignant neoplasm of sigmoid colon: Secondary | ICD-10-CM

## 2016-01-15 DIAGNOSIS — Z95828 Presence of other vascular implants and grafts: Secondary | ICD-10-CM

## 2016-01-15 LAB — CBC WITH DIFFERENTIAL/PLATELET
BASO%: 0.3 % (ref 0.0–2.0)
Basophils Absolute: 0 10*3/uL (ref 0.0–0.1)
EOS ABS: 0.2 10*3/uL (ref 0.0–0.5)
EOS%: 6.3 % (ref 0.0–7.0)
HCT: 28.5 % — ABNORMAL LOW (ref 38.4–49.9)
HGB: 9.7 g/dL — ABNORMAL LOW (ref 13.0–17.1)
LYMPH%: 33.6 % (ref 14.0–49.0)
MCH: 29.4 pg (ref 27.2–33.4)
MCHC: 34 g/dL (ref 32.0–36.0)
MCV: 86.4 fL (ref 79.3–98.0)
MONO#: 0.2 10*3/uL (ref 0.1–0.9)
MONO%: 6 % (ref 0.0–14.0)
NEUT%: 53.8 % (ref 39.0–75.0)
NEUTROS ABS: 1.7 10*3/uL (ref 1.5–6.5)
Platelets: 136 10*3/uL — ABNORMAL LOW (ref 140–400)
RBC: 3.3 10*6/uL — AB (ref 4.20–5.82)
RDW: 16.7 % — ABNORMAL HIGH (ref 11.0–14.6)
WBC: 3.2 10*3/uL — AB (ref 4.0–10.3)
lymph#: 1.1 10*3/uL (ref 0.9–3.3)

## 2016-01-15 LAB — MAGNESIUM: MAGNESIUM: 1 mg/dL — AB (ref 1.5–2.5)

## 2016-01-15 MED ORDER — SODIUM CHLORIDE 0.9 % IV SOLN
Freq: Once | INTRAVENOUS | Status: AC
  Administered 2016-01-15: 15:00:00 via INTRAVENOUS
  Filled 2016-01-15: qty 500

## 2016-01-15 MED ORDER — HEPARIN SOD (PORK) LOCK FLUSH 100 UNIT/ML IV SOLN
500.0000 [IU] | INTRAVENOUS | Status: AC | PRN
  Administered 2016-01-15: 500 [IU]
  Filled 2016-01-15: qty 5

## 2016-01-15 MED ORDER — SODIUM CHLORIDE 0.9% FLUSH
10.0000 mL | INTRAVENOUS | Status: DC | PRN
  Administered 2016-01-15: 10 mL via INTRAVENOUS
  Filled 2016-01-15: qty 10

## 2016-01-15 MED ORDER — SODIUM CHLORIDE 0.9% FLUSH
10.0000 mL | INTRAVENOUS | Status: AC | PRN
  Administered 2016-01-15: 10 mL
  Filled 2016-01-15: qty 10

## 2016-01-15 MED ORDER — SODIUM CHLORIDE 0.9 % IV SOLN: 6.0000 g | Freq: Once | INTRAVENOUS | Status: DC

## 2016-01-15 MED ORDER — MAGNESIUM OXIDE 400 (241.3 MG) MG PO TABS: 400.0000 mg | ORAL_TABLET | Freq: Three times a day (TID) | ORAL | Status: DC

## 2016-01-15 NOTE — Patient Instructions (Signed)
Hypomagnesemia °Hypomagnesemia is a condition in which the level of magnesium in the blood is low. Magnesium is a mineral that is found in many foods. It is used in many different processes in the body. Hypomagnesemia can affect every organ in the body. It can cause life-threatening problems. °CAUSES °Causes of hypomagnesemia include: °· Not getting enough magnesium in your diet. °· Malnutrition. °· Problems with absorbing magnesium from the intestines. °· Dehydration. °· Alcohol abuse. °· Vomiting. °· Severe diarrhea. °· Some medicines, including medicines that make you urinate more. °· Certain diseases, such as kidney disease, diabetes, and overactive thyroid. °SIGNS AND SYMPTOMS °· Involuntary shaking or trembling of a body part (tremor). °· Confusion. °· Muscle weakness. °· Sensitivity to light, sound, and touch. °· Psychiatric issues, such as depression, irritability, or psychosis. °· Sudden tightening of muscles (muscle spasms). °· Tingling in the arms and legs. °· A feeling of fluttering of the heart. °These symptoms are more severe if magnesium levels drop suddenly. °DIAGNOSIS °To make a diagnosis, your health care provider will do a physical exam and order blood and urine tests. °TREATMENT °Treatment will depend on the cause and the severity of your condition. It may involve: °· A magnesium supplement. This can be taken in pill form. It can also be given through an IV tube. This is usually done if the condition is severe. °· Changes to your diet. You may be directed to eat foods that have a lot of magnesium, such as green leafy vegetables, peas, beans, and nuts. °· Eliminating alcohol from your diet. °HOME CARE INSTRUCTIONS °· Include foods with magnesium in your diet. Foods that are rich in magnesium include green vegetables, beans, nuts and seeds, and whole grains. °· Take medicines only as directed by your health care provider. °· Take magnesium supplements if your health care provider instructs you to  do that. Take them as directed. °· Have your magnesium levels monitored as directed by your health care provider. °· When you are active, drink fluids that contain electrolytes. °· Keep all follow-up visits as directed by your health care provider. This is important. °SEEK MEDICAL CARE IF: °· You get worse instead of better. °· Your symptoms return. °SEEK IMMEDIATE MEDICAL CARE IF: °· Your symptoms are severe. °  °This information is not intended to replace advice given to you by your health care provider. Make sure you discuss any questions you have with your health care provider. °  °Document Released: 07/10/2005 Document Revised: 11/04/2014 Document Reviewed: 05/30/2014 °Elsevier Interactive Patient Education ©2016 [employer] ° °

## 2016-01-15 NOTE — Patient Outreach (Signed)
[facility]) Care Management  01/15/2016  Jason Reilly Jun 15, 1946 [id number]   Telephone Assessment  RN attempted to reach this pt with use of an interpreter ([employer] # [phone]) however pt not available at both contact numbers noted in EPIC. Message left to home number and pt that answered at the mobile number indicated that was the wrong number for this pt. RN will attempt one additional time to reach this pt prior to sending a letter and notifying his provider.   Raina Mina, RN Care Management Coordinator [place] Office [phone]

## 2016-01-15 NOTE — Patient Instructions (Signed)

## 2016-01-17 ENCOUNTER — Encounter: Payer: Self-pay | Admitting: *Deleted

## 2016-01-17 ENCOUNTER — Other Ambulatory Visit: Payer: Self-pay | Admitting: *Deleted

## 2016-01-17 NOTE — Patient Outreach (Signed)
[facility]) Care Management  01/17/2016  Jason Reilly [birth year] [id number]  Third Outreach  Attempt  RN attempted to contact this pt however unsuccessful. Interpreter used [employer] # [phone]) who left a message for this pt. RN will send outreach letter per policy and close case in 10 days as indicated. Will also alert provider.  Raina Mina, RN Care Management Coordinator [place] Office [phone]

## 2016-01-22 ENCOUNTER — Telehealth: Payer: Self-pay | Admitting: Hematology

## 2016-01-22 ENCOUNTER — Telehealth: Payer: Self-pay | Admitting: *Deleted

## 2016-01-22 ENCOUNTER — Ambulatory Visit (HOSPITAL_BASED_OUTPATIENT_CLINIC_OR_DEPARTMENT_OTHER): Payer: PPO | Admitting: Hematology

## 2016-01-22 ENCOUNTER — Ambulatory Visit: Payer: PPO

## 2016-01-22 ENCOUNTER — Ambulatory Visit (HOSPITAL_BASED_OUTPATIENT_CLINIC_OR_DEPARTMENT_OTHER): Payer: PPO

## 2016-01-22 ENCOUNTER — Encounter: Payer: Self-pay | Admitting: Hematology

## 2016-01-22 ENCOUNTER — Other Ambulatory Visit (HOSPITAL_BASED_OUTPATIENT_CLINIC_OR_DEPARTMENT_OTHER): Payer: PPO

## 2016-01-22 VITALS — BP 118/70 | HR 95 | Temp 98.2°F | Resp 18 | Ht 65.0 in | Wt 140.0 lb

## 2016-01-22 DIAGNOSIS — C187 Malignant neoplasm of sigmoid colon: Secondary | ICD-10-CM

## 2016-01-22 DIAGNOSIS — C787 Secondary malignant neoplasm of liver and intrahepatic bile duct: Secondary | ICD-10-CM | POA: Diagnosis not present

## 2016-01-22 DIAGNOSIS — C7800 Secondary malignant neoplasm of unspecified lung: Secondary | ICD-10-CM | POA: Diagnosis not present

## 2016-01-22 DIAGNOSIS — Z5112 Encounter for antineoplastic immunotherapy: Secondary | ICD-10-CM | POA: Diagnosis not present

## 2016-01-22 DIAGNOSIS — Z5111 Encounter for antineoplastic chemotherapy: Secondary | ICD-10-CM

## 2016-01-22 DIAGNOSIS — D509 Iron deficiency anemia, unspecified: Secondary | ICD-10-CM

## 2016-01-22 DIAGNOSIS — Z95828 Presence of other vascular implants and grafts: Secondary | ICD-10-CM

## 2016-01-22 DIAGNOSIS — I10 Essential (primary) hypertension: Secondary | ICD-10-CM

## 2016-01-22 DIAGNOSIS — C772 Secondary and unspecified malignant neoplasm of intra-abdominal lymph nodes: Secondary | ICD-10-CM

## 2016-01-22 DIAGNOSIS — C7972 Secondary malignant neoplasm of left adrenal gland: Secondary | ICD-10-CM

## 2016-01-22 DIAGNOSIS — E119 Type 2 diabetes mellitus without complications: Secondary | ICD-10-CM

## 2016-01-22 DIAGNOSIS — K254 Chronic or unspecified gastric ulcer with hemorrhage: Secondary | ICD-10-CM

## 2016-01-22 DIAGNOSIS — I5042 Chronic combined systolic (congestive) and diastolic (congestive) heart failure: Secondary | ICD-10-CM

## 2016-01-22 DIAGNOSIS — D500 Iron deficiency anemia secondary to blood loss (chronic): Secondary | ICD-10-CM

## 2016-01-22 LAB — CBC WITH DIFFERENTIAL/PLATELET
BASO%: 0.7 % (ref 0.0–2.0)
BASOS ABS: 0 10*3/uL (ref 0.0–0.1)
EOS ABS: 0.1 10*3/uL (ref 0.0–0.5)
EOS%: 3.4 % (ref 0.0–7.0)
HCT: 27.7 % — ABNORMAL LOW (ref 38.4–49.9)
HEMOGLOBIN: 9.1 g/dL — AB (ref 13.0–17.1)
LYMPH%: 19.1 % (ref 14.0–49.0)
MCH: 29.2 pg (ref 27.2–33.4)
MCHC: 33 g/dL (ref 32.0–36.0)
MCV: 88.5 fL (ref 79.3–98.0)
MONO#: 0.5 10*3/uL (ref 0.1–0.9)
MONO%: 14.2 % — AB (ref 0.0–14.0)
NEUT%: 62.6 % (ref 39.0–75.0)
NEUTROS ABS: 2.2 10*3/uL (ref 1.5–6.5)
PLATELETS: 150 10*3/uL (ref 140–400)
RBC: 3.13 10*6/uL — ABNORMAL LOW (ref 4.20–5.82)
RDW: 18.7 % — AB (ref 11.0–14.6)
WBC: 3.5 10*3/uL — ABNORMAL LOW (ref 4.0–10.3)
lymph#: 0.7 10*3/uL — ABNORMAL LOW (ref 0.9–3.3)

## 2016-01-22 LAB — COMPREHENSIVE METABOLIC PANEL
ALK PHOS: 167 U/L — AB (ref 40–150)
ALT: 34 U/L (ref 0–55)
ANION GAP: 9 meq/L (ref 3–11)
AST: 38 U/L — ABNORMAL HIGH (ref 5–34)
Albumin: 3.3 g/dL — ABNORMAL LOW (ref 3.5–5.0)
BILIRUBIN TOTAL: 0.64 mg/dL (ref 0.20–1.20)
BUN: 10.8 mg/dL (ref 7.0–26.0)
CALCIUM: 8.2 mg/dL — AB (ref 8.4–10.4)
CO2: 23 meq/L (ref 22–29)
CREATININE: 0.9 mg/dL (ref 0.7–1.3)
Chloride: 105 mEq/L (ref 98–109)
EGFR: 89 mL/min/{1.73_m2} — AB (ref >90)
Glucose: 174 mg/dl — ABNORMAL HIGH (ref 70–140)
Potassium: 4.2 mEq/L (ref 3.5–5.1)
Sodium: 138 mEq/L (ref 136–145)
TOTAL PROTEIN: 7 g/dL (ref 6.4–8.3)

## 2016-01-22 LAB — MAGNESIUM: MAGNESIUM: 0.8 mg/dL — AB (ref 1.5–2.5)

## 2016-01-22 MED ORDER — SODIUM CHLORIDE 0.9% FLUSH
10.0000 mL | INTRAVENOUS | Status: DC | PRN
  Administered 2016-01-22: 10 mL via INTRAVENOUS
  Filled 2016-01-22: qty 10

## 2016-01-22 MED ORDER — HEPARIN SOD (PORK) LOCK FLUSH 100 UNIT/ML IV SOLN
500.0000 [IU] | Freq: Once | INTRAVENOUS | Status: AC | PRN
  Administered 2016-01-22: 500 [IU]
  Filled 2016-01-22: qty 5

## 2016-01-22 MED ORDER — ATROPINE SULFATE 1 MG/ML IJ SOLN
0.5000 mg | Freq: Once | INTRAMUSCULAR | Status: AC | PRN
  Administered 2016-01-22: 0.5 mg via INTRAVENOUS

## 2016-01-22 MED ORDER — SODIUM CHLORIDE 0.9 % IV SOLN
Freq: Once | INTRAVENOUS | Status: AC
  Administered 2016-01-22: 12:00:00 via INTRAVENOUS
  Filled 2016-01-22: qty 4

## 2016-01-22 MED ORDER — SODIUM CHLORIDE 0.9 % IV SOLN
Freq: Once | INTRAVENOUS | Status: AC
  Administered 2016-01-22: 12:00:00 via INTRAVENOUS

## 2016-01-22 MED ORDER — MAGNESIUM SULFATE 50 % IJ SOLN
6.0000 g | Freq: Once | INTRAMUSCULAR | Status: AC
Start: 2016-01-22 — End: 2016-01-22
  Administered 2016-01-22: 6 g via INTRAVENOUS
  Filled 2016-01-22: qty 12

## 2016-01-22 MED ORDER — SODIUM CHLORIDE 0.9 % IV SOLN
6.2000 mg/kg | Freq: Once | INTRAVENOUS | Status: AC
  Administered 2016-01-22: 400 mg via INTRAVENOUS
  Filled 2016-01-22: qty 20

## 2016-01-22 MED ORDER — IRINOTECAN HCL CHEMO INJECTION 100 MG/5ML
165.0000 mg/m2 | Freq: Once | INTRAVENOUS | Status: AC
  Administered 2016-01-22: 280 mg via INTRAVENOUS
  Filled 2016-01-22: qty 10

## 2016-01-22 MED ORDER — ATROPINE SULFATE 1 MG/ML IJ SOLN
INTRAMUSCULAR | Status: AC
  Filled 2016-01-22: qty 1

## 2016-01-22 MED ORDER — SODIUM CHLORIDE 0.9 % IJ SOLN
10.0000 mL | INTRAMUSCULAR | Status: DC | PRN
  Administered 2016-01-22: 10 mL
  Filled 2016-01-22: qty 10

## 2016-01-22 NOTE — Patient Instructions (Signed)

## 2016-01-22 NOTE — Progress Notes (Signed)
[place]  Telephone:(336) [phone] Fax:(336) [phone]  Clinic follow up Note   Patient Care Team: [name], MD as PCP - General (Specialist) [name], CRNP as Nurse Practitioner (Nurse Practitioner) Truitt Merle, MD as Consulting Physician (Hematology) Ladene Artist, MD as Consulting Physician (Gastroenterology) Tobi Bastos, RN as [place] Management 01/22/2016  CHIEF COMPLAINTS Follow up metastatic sigmoid colon cancer  Oncology History   He is a Metastatic colon cancer to liver   Staging form: Colon and Rectum, AJCC 7th Edition     Clinical: Stage Unknown ([place], NX, M1) - Unsigned      Cancer of sigmoid colon ([place])   10/28/2014 Tumor Marker AFP 3.2 CEA > 10,000 CA 19-9 12,929.6. tumor (-) KRAS and NRAS mutation.    11/10/2014 Imaging PET scan showed hypermetabolic mass in the sigmoid colon was noted metastasis in the retroperitoneum. Probable left adrenal and pulmonary metastasis, and diffuse liver metastasis.   11/21/2014 Initial Diagnosis Metastatic colon cancer to liver, lung, abd nodes and left adrenal gland. Diagnosis was made by liver biopsy.    11/30/2014 -  Chemotherapy First line chemo [id number], Panitumumab added from second cycle    12/28/2014 - 12/31/2014 Hospital Admission Was admitted for dehydration, neutropenia fever with UTI, and severe skin rash.   02/08/2015 - 02/12/2015 Hospital Admission He was admitted for upper GI bleeding, e.g. showed a gastric ulcer with clots, status post appendectomy injection. He also received a blood transfusion.   03/01/2015 Tumor Marker CEA 694, CA19.9 462   03/13/2015 Imaging CT CAP showed partial response, no new lesions.    03/15/2015 - 05/23/2015 Chemotherapy restart FOLFOX, held again on 8/9 due to GI bleeding    05/31/2015 - 06/02/2015 Hospital Admission He was admitted to [facility] in [place] due to upper GI bleeding, EGD showed gastric and dudenal ulcers    06/27/2015 - 06/29/2015 Hospital  Admission he was admitted for dairrhea and pancolitis, c-diff and stool cultures were negative, treated with antibiotics   07/12/2015 -  Chemotherapy panitumumab 19m/kg, every 2 weeks   11/08/2015 - 11/10/2015 Hospital Admission Pt was admitted for sepsis and hypotension, was found to have (+) influenza A and treated.    11/30/2015 Imaging  CT scans reviewed continued improvement in the liver metastasis, no other new lesions.     HISTORY OF PRESENTING ILLNESS:  Jason Reilly 70y.o. male is here because of abnormal CT findings, which is very suspicious for malignancy. [place] He came in with his son and an interpreter.  He has been feeling fatigued since two month ago. He is still able to do all ADLs. He otherwise denies any pain, bloating or nausea.  He lost about 20lbs in 3 month. His appetite is lower than before, eats less, no change of his bowl habits.  She denied any hematochezia or melana. Per his son, he has had some personality changes daily, irritable, slightly confused some time.  He was evaluated by his primary care physician. Lab test reviewed hepatitis B infection, which he did not know before, and elevated alkaline phosphatase, his liver function and the rest of the liver function was not remarkable. [place] was obtained on 07/22/2014, which showed diffusely abnormal liver with multiple echogenic lesions. CT of abdomen with and without contrast was done on 08/26/2014, which reviewed here at a medically with multiple large partially calcified hepatic masses consistent with metastatic  disease. Mild retroperitoneal adenopathy with the largest node measuring 1.6 cm. And nonspecific 1.4 cm left adrenal nodule was also noticed. His tumor marker showed CEA greater than 10,000, CA 19-9 12,929, AFP 3.2 (normal). He was referred to [facility] liver clinic and was evaluated by nurse practitioner [name]. Treatment for  hepatitis B was not recommended based on his virus load.  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 He was evaluated by a cardiologist in 2014. He denies any significant dyspnea on exertion. No leg swollen.  CURRENT THERAPY: panitumumab 74m/kg, every 2 weeks, started on 07/12/2015, Irinotecan 1849mm2 every 2 weeks added on 09/19/2015, dose decreased to 16064m2 from cycle 2.  He also receives IV magnesium 6g weekly   INTERIM HISTORY: Pau[place]ond[place]urns for follow-up and chemotherapy. He is accompanied by his son and interpreter to the clinic today.  He is doing pretty well overall. He tolerated the last cycle chemotherapy very well, no diarrhea, nausea, or other noticeable side effects.  He has been eating well, and a few pounds lately. No pain or other complaints. He denies episodes of melena, hematochezia all other bleeding.  MEDICAL HISTORY:  Past Medical History  Diagnosis Date  . Tachycardia   . Abnormal EKG   . Hypertension   . Non-ischemic cardiomyopathy ([place]CC[place] . Hepatitis B   . H. pylori infection   . Gastric ulcer   . Metastatic colon cancer to liver ([place] . Diabetes mellitus without complication ([place]CC[place]   metformin    SURGICAL HISTORY: Past Surgical History  Procedure Laterality Date  . Nuclear stress test  03/03/2013    High risk - consistent with nonischemic cardiomyopathy  . Left and right heart catheterization with coronary angiogram N/A 03/29/2013    Procedure: LEFT AND RIGHT HEART CATHETERIZATION WITH CORONARY ANGIOGRAM;  Surgeon: KenPixie CasinoD;  [facility];  Service: Cardiovascular;  Laterality: N/A;  . Esophagogastroduodenoscopy N/A 02/08/2015    Procedure: ESOPHAGOGASTRODUODENOSCOPY (EGD);  Surgeon: MalLadene ArtistD;  [facility];  Service: Endoscopy;  Laterality: N/A;  . Esophagogastroduodenoscopy (egd) with propofol N/A 08/15/2015    Procedure: ESOPHAGOGASTRODUODENOSCOPY (EGD) WITH PROPOFOL;   Surgeon: MalLadene ArtistD;  Location: [facility];  Service: Endoscopy;  Laterality: N/A;    SOCIAL HISTORY: Social History   Social History  . Marital Status: Married    Spouse Name: N/A  . Number of Children: N/A  . Years of Education: N/A   Occupational History  . Not on file.   Social History Main Topics  . Smoking status: Former Smoker    Types: Cigarettes    Quit date: 11/07/2008  . Smokeless tobacco: Never Used  . Alcohol Use: Yes     Comment: occasional  . Drug Use: No  . Sexual Activity: No   Other Topics Concern  . Not on file   Social History Narrative   Married   Enjoys walking   [place] lived [place]    FAMILY HISTORY: No family history of liver disease or malignancy.  ALLERGIES:  has No Known Allergies.  MEDICATIONS:  Current Outpatient Prescriptions on File Prior to Visit  Medication Sig Dispense Refill  . clindamycin (CLINDAGEL) 1 % gel Apply topically 2 (two) times daily. 30 g 2  . folic acid (FOLVITE) 1 MG tablet Take 1 tablet (1 mg total) by mouth daily. 30 tablet 3  . hydrocortisone 1 % ointment Apply 1  application topically 2 (two) times daily. 56 g 0  . lidocaine-prilocaine (EMLA) cream Apply 1 application topically as needed. Apply to [facility] cath at least one hour before needle stick. 30 g 2  . loperamide (IMODIUM) 2 MG capsule Take 2 capsules (4 mg total) by mouth 4 (four) times daily as needed for diarrhea or loose stools (NO MORE THAN 8 TABLETS PER DAY.). 60 capsule 3  . magic mouthwash SOLN Take 5 mLs by mouth 4 (four) times daily. 120 mL 1  . magnesium oxide (MAG-OX) 400 (241.3 Mg) MG tablet Take 1 tablet (400 mg total) by mouth 3 (three) times daily. 60 tablet 3  . metFORMIN (GLUCOPHAGE) 500 MG tablet Take 1 tablet (500 mg total) by mouth 2 (two) times daily with a meal. THIS IS A ONE TIME ORDER.  FUTURE REFILLS NEED TO BE DONE BY PRIMARY MD. 90 tablet 0  . nadolol (CORGARD) 20 MG tablet Take 1 tablet (20 mg total) by mouth 2  (two) times daily. THIS IS A ONE TIME ORDER.  FUTURE REFILLS NEED TO BE DONE BY PRIMARY MD. 90 tablet 0  . ondansetron (ZOFRAN) 8 MG tablet Take 1 tablet (8 mg total) by mouth every 8 (eight) hours as needed for nausea or vomiting. 45 tablet 2  . pantoprazole (PROTONIX) 40 MG tablet Take 1 tablet (40 mg total) by mouth daily. 30 tablet 2  . prochlorperazine (COMPAZINE) 10 MG tablet Take 1 tablet (10 mg total) by mouth every 6 (six) hours as needed for nausea or vomiting. 30 tablet 1   Current Facility-Administered Medications on File Prior to Visit  Medication Dose Route Frequency Provider Last Rate Last Dose  . sodium chloride 0.9 % injection 10 mL  10 mL Intracatheter PRN Truitt Merle, MD   10 mL at 10/03/15 1705  ;   REVIEW OF SYSTEMS:   Constitutional: Denies fevers, chills or abnormal night sweats, (+) fatigue  Eyes: Denies blurriness of vision, double vision or watery eyes Ears, nose, mouth, throat, and face: Denies mucositis or sore throat Respiratory: Denies cough, dyspnea or wheezes Cardiovascular: Denies palpitation, chest discomfort or lower extremity swelling Gastrointestinal: Denies nausea, heartburn or change in bowel habits Skin: Denies abnormal skin rashes Lymphatics: Denies new lymphadenopathy or easy bruising Neurological:Denies numbness, tingling or new weaknesses Behavioral/Psych: Mood is stable, no new changes, (+) insomnia All other systems were reviewed with the patient and are negative.  PHYSICAL EXAMINATION: BP 118/70 mmHg  Pulse 95  Temp(Src) 98.2 F (36.8 C) (Oral)  Resp 18  Ht _0  (1.651 m)  Wt 140 lb (63.504 kg)  BMI 23.30 kg/m2  SpO2 100%  ECOG PERFORMANCE STATUS: 1 Vital sign were taken at in the infusion room, within normal limits. GENERAL:alert, no distress and comfortable SKIN: (+) Dry skin,  no skin ulcer, (+) diffuse skin rashes on his neck and upper chest, some are resolving with skin pigmentation. No discharge. EYES: normal, conjunctiva are  pink and non-injected, sclera clear OROPHARYNX:no exudate, no erythema and lips, buccal mucosa, and tongue with mild discoloration at the tip.  NECK: supple, thyroid normal size, non-tender, without nodularity LYMPH:  no palpable lymphadenopathy in the cervical, axillary or inguinal LUNGS: clear to auscultation and percussion with normal breathing effort, no rales   HEART: regular rate & rhythm and no murmurs and no lower extremity edema ABDOMEN:abdomen soft, non-tender, no hepatomegaly, no splenomegaly and normal bowel sounds Musculoskeletal:no cyanosis of digits and no clubbing  PSYCH: alert & oriented x 3 with fluent  speech NEURO: no focal motor/sensory deficits  LABORATORY DATA:  I have reviewed the data as listed CBC Latest Ref Rng 01/22/2016 01/15/2016 01/08/2016  WBC 4.0 - 10.3 10e3/uL 3.5(L) 3.2(L) 5.1  Hemoglobin 13.0 - 17.1 g/dL 9.1(L) 9.7(L) 9.1(L)  Hematocrit 38.4 - 49.9 % 27.7(L) 28.5(L) 27.2(L)  Platelets 140 - 400 10e3/uL 150 136(L) 122(L)   ANC is 3.4  CMP Latest Ref Rng 01/22/2016 01/08/2016 01/01/2016  Glucose 70 - 140 mg/dl 174(H) 110 144(H)  BUN 7.0 - 26.0 mg/dL 10.8 12.6 14.2  Creatinine 0.7 - 1.3 mg/dL 0.9 0.7 0.7  Sodium 136 - 145 mEq/L 138 136 136  Potassium 3.5 - 5.1 mEq/L 4.2 3.8 4.4  CO2 22 - 29 mEq/L _0 Calcium 8.4 - 10.4 mg/dL 8.2(L) 8.4 8.6  Total Protein 6.4 - 8.3 g/dL 7.0 7.0 7.2  Total Bilirubin 0.20 - 1.20 mg/dL 0.64 0.50 0.60  Alkaline Phos 40 - 150 U/L 167(H) 185(H) 135  AST 5 - 34 U/L 38(H) 32 29  ALT 0 - 55 U/L 34 30 39   Mag 0.8 today  ANC 2.2 today   INITIAL tumor markers AFP 3.2 CEA > 10,000 CA 19-9 12,929.6  Results for DAIJON, WENKE (MRN [id number]) as of 01/22/2016 12:56  Ref. Range 09/06/2015 09:57 10/03/2015 08:49 10/31/2015 08:01 01/08/2016 09:53  CA 19-9 Latest Ref Range: <35.0 U/mL 53.7 (H) 62.3 (H) 40.7 (H)   CEA Latest Units: ng/mL 73.7 (H) 66.4 (H) 50.6 (H) 80.3 (H)  CEA Latest Ref Range: 0.0-4.7 ng/mL    110.6 (H)   Pathology  report  Liver, needle/core biopsy - METASTATIC ADENOCARCINOMA, SEE COMMENT. Microscopic Comment The adenocarcinoma demonstrates the following immunophenotype: Cytokeratin 7 - negative expression. Cytokeratin 20 - strong diffuse expression. CD2 - strong diffuse expression. Overall the morphology and immunophenotype are that of metastatic adenocarcinoma primary to colorectum. The recent nuclear medicine scan demonstrating sigmoid mass with associated liver masses is noted.  [facility] test result:    RADIOGRAPHIC STUDIES: I have personally reviewed the outside CT scan image with patient and his son.   CT chest, abdomen and pelvis with IV contrast oN  11/30/2015 IMPRESSION: Mild residual wall thickening involving the distal descending/ proximal sigmoid colon, possibly reflecting a combination residual tumor and/or inflammatory changes.  Improving hepatic metastases, with index lesions as above. Right adrenal metastases, unchanged. Mild abdominopelvic nodal metastases, grossly unchanged.  No evidence of metastatic disease in the chest.  ASSESSMENT & PLAN:  70 year old [place] male, with past history of hypertension and dilated nonischemic gammopathy with EF 25%, no clinical signs of heart failure, who was found to have hepatitis B infection lately, and multiple liver lesions on the CT scan. He has extremely high CEA and CA 19-9 levels. PET scan reviewed a hypermetabolic sigmoid colon mass, diffuse liver metastasis, probable lung and adrenal gland metastasis.  1. Metastatic sigmoid colon cancer, with diffuse liver, lungs, node and left adrenal gland metastases. KRAS/NRAS wild type, MSI-stable -Liver biopsy showed metastatic adenocarcinoma. His tumor were strongly positive for CK20 and CD2, consistent with primary colorectal primary. KRAS and NRAS mutations were not detected.  -Pt understands that this is incurable cancer, and he has very high disease burden and overall prognosis is  poor. The treatment goal is palliative -He had excellent response to first-line FOLFOX, but treatment was complicated with GI bleeding and neutropenic fever, resolved now. -he is currently on second line chemotherapy with irritecan and panitumumab, tolerating well  - I reviewed his restaging CT scan from  11/30/2015, which showed a continuous responding in liver metastasis, no other new lesions. -He is clinical doing well, lab reviewed, adequate for treatment, we'll continue chemotherapy today. - plan to repeat CT scan in May , if he  Continues to do well  2. Grade 1-2 skin rashes, improved  -Secondary to panitumumab, improved and stable lately  -continue hydrocortisone 2.5%, and clindamycin gel 1%  twice daily as needed  -He knows to avoid sun exposure, and call me if it gets worse.  3. Gastric ulcer with significant GI bleeding in April and Aug 2016 -He is on PPI, continue once daily  -Repeat his EGD on 08/15/2015 showed near complete healing of his gastric ulcer -continue Nadolol 45m bid, per Dr. SFuller Plan- I encourage him to follow-up with Dr. SFuller Plan 4. Type 2 DM  -HbA1c was 8.5, blood glucose not well controlled, bette r lately  -I encourage him to follow-up with his primary care physician Dr. WJimmye [place] 5. HTN, Dilated nonischemic ischemia cardiomyopathy with EF 25% -He is clinically doing well without symptoms of CHF. However this is probably going to impact his chemotherapy.Will try to avoid cardiotoxic chemotherapy agent and avoid fluid overload during chemotherapy. -Continue follow-up with cardiology.  6 Hepatitis B carrier, with mild portal hypertension  -Per liver clinic, no need for treatment. Follow-up with Dr. SSilvio Pate  7. Malnutrition -I encouraged him to eat more, and take supplements as needed. -follow up with Dietitian   8. Anemia secondary to GI bleeding, iron deficiency and chemo  -Repeat lab on 06/06/2015 showed ferritin 117, serum iron 24, saturation 8%, which  supports iron deficiency -He received IV Feraheme again in Aug 2016 after GI bleeding  -Repeat a ferritin was 273 on 10/03/2015, much improved  -he received iv feraheme again on 11/27/2015 and 12/04/2015, but anemia did not improve much. -His anemia has been getting worse lately, probably related to chemotherapy, we'll close monitor any signs of GI bleeding. -Blood transfusion if hemoglobin less than 8  11. hypomagnesemia  -secondary to panitumumab -he receives IV mag 6 g every 1-2 weeks -He is taking magnesium pill 2 tablets 3 times a day, we'll hold it for now due to his diarrhea -follow up weekly   12. Diarrhea -Secondary to chemotherapy. Resolved now. -I encouraged him to use Imodium if he has diarrhea after chemotherapy   Plan -Lab results reviewed, adequate for treatment, we'll proceed chemotherapy today -Return in 1 week for lab and magnesium infusion -I'll see him back in 2 weeks   All questions were answered. The patient knows to call the clinic with any problems, questions or concerns.  I spent 25 minutes counseling the patient face to face. The total time spent in the appointment was 30 minutes and more than 50% was on counseling.     FTruitt Merle MD 01/22/2016

## 2016-01-22 NOTE — Telephone Encounter (Signed)
Per staff message and POF I have scheduled appts. Advised scheduler of appts. JMW  

## 2016-01-22 NOTE — Telephone Encounter (Signed)
per pof to sch pt appt-sent MW email to move trmt on 4/10 to coordinate w.MD appt-pt tog et updated copy of avs

## 2016-01-22 NOTE — Patient Instructions (Addendum)
[facility] Discharge Instructions for Patients Receiving Chemotherapy  Today you received the following chemotherapy agents Vectibix, Camptosar and Magnesium drip.  To help prevent nausea and vomiting after your treatment, we encourage you to take your nausea medication as prescribed.   If you develop nausea and vomiting that is not controlled by your nausea medication, call the clinic.   BELOW ARE SYMPTOMS THAT SHOULD BE REPORTED IMMEDIATELY:  *FEVER GREATER THAN 100.5 F  *CHILLS WITH OR WITHOUT FEVER  NAUSEA AND VOMITING THAT IS NOT CONTROLLED WITH YOUR NAUSEA MEDICATION  *UNUSUAL SHORTNESS OF BREATH  *UNUSUAL BRUISING OR BLEEDING  TENDERNESS IN MOUTH AND THROAT WITH OR WITHOUT PRESENCE OF ULCERS  *URINARY PROBLEMS  *BOWEL PROBLEMS  UNUSUAL RASH Items with * indicate a potential emergency and should be followed up as soon as possible.  Feel free to call the clinic you have any questions or concerns. The clinic phone number is [phone].  Please show the [place] at check-in to the Emergency Department and triage nurse.

## 2016-01-26 ENCOUNTER — Encounter: Payer: Self-pay | Admitting: *Deleted

## 2016-01-26 NOTE — Patient Outreach (Signed)
[facility]) Care Management  01/26/2016  Jason Reilly 05/27/1946 [id number]   Outreach letter send earlier in March with no response case will be closed due to no return contact to [facility].  Marland Kitchen  Raina Mina, RN Care Management Coordinator [place] Office [phone]

## 2016-01-28 ENCOUNTER — Other Ambulatory Visit: Payer: Self-pay | Admitting: Hematology

## 2016-01-29 ENCOUNTER — Ambulatory Visit: Payer: PPO

## 2016-01-29 ENCOUNTER — Other Ambulatory Visit (HOSPITAL_BASED_OUTPATIENT_CLINIC_OR_DEPARTMENT_OTHER): Payer: PPO

## 2016-01-29 ENCOUNTER — Ambulatory Visit (HOSPITAL_BASED_OUTPATIENT_CLINIC_OR_DEPARTMENT_OTHER): Payer: PPO

## 2016-01-29 ENCOUNTER — Other Ambulatory Visit: Payer: Self-pay | Admitting: Hematology

## 2016-01-29 VITALS — BP 118/74 | HR 100 | Temp 98.4°F | Resp 16

## 2016-01-29 DIAGNOSIS — D500 Iron deficiency anemia secondary to blood loss (chronic): Secondary | ICD-10-CM | POA: Diagnosis not present

## 2016-01-29 DIAGNOSIS — C187 Malignant neoplasm of sigmoid colon: Secondary | ICD-10-CM

## 2016-01-29 DIAGNOSIS — Z95828 Presence of other vascular implants and grafts: Secondary | ICD-10-CM

## 2016-01-29 DIAGNOSIS — D509 Iron deficiency anemia, unspecified: Secondary | ICD-10-CM

## 2016-01-29 LAB — CBC WITH DIFFERENTIAL/PLATELET
BASO%: 0.2 % (ref 0.0–2.0)
Basophils Absolute: 0 10*3/uL (ref 0.0–0.1)
EOS ABS: 0.1 10*3/uL (ref 0.0–0.5)
EOS%: 2.8 % (ref 0.0–7.0)
HEMATOCRIT: 29.4 % — AB (ref 38.4–49.9)
HGB: 9.8 g/dL — ABNORMAL LOW (ref 13.0–17.1)
LYMPH#: 0.9 10*3/uL (ref 0.9–3.3)
LYMPH%: 18.1 % (ref 14.0–49.0)
MCH: 29.3 pg (ref 27.2–33.4)
MCHC: 33.5 g/dL (ref 32.0–36.0)
MCV: 87.5 fL (ref 79.3–98.0)
MONO#: 0.7 10*3/uL (ref 0.1–0.9)
MONO%: 14.3 % — ABNORMAL HIGH (ref 0.0–14.0)
NEUT%: 64.6 % (ref 39.0–75.0)
NEUTROS ABS: 3.1 10*3/uL (ref 1.5–6.5)
PLATELETS: 208 10*3/uL (ref 140–400)
RBC: 3.36 10*6/uL — AB (ref 4.20–5.82)
RDW: 18.4 % — ABNORMAL HIGH (ref 11.0–14.6)
WBC: 4.8 10*3/uL (ref 4.0–10.3)

## 2016-01-29 LAB — MAGNESIUM: Magnesium: 1 mg/dl — CL (ref 1.5–2.5)

## 2016-01-29 LAB — FERRITIN: Ferritin: 1332 ng/ml — ABNORMAL HIGH (ref 22–316)

## 2016-01-29 MED ORDER — HEPARIN SOD (PORK) LOCK FLUSH 100 UNIT/ML IV SOLN
500.0000 [IU] | INTRAVENOUS | Status: AC | PRN
  Administered 2016-01-29: 500 [IU]
  Filled 2016-01-29: qty 5

## 2016-01-29 MED ORDER — MAGNESIUM SULFATE 50 % IJ SOLN
6.0000 g | Freq: Once | INTRAMUSCULAR | Status: DC
Start: 2016-01-29 — End: 2016-01-29

## 2016-01-29 MED ORDER — SODIUM CHLORIDE 0.9 % IV SOLN
Freq: Once | INTRAVENOUS | Status: AC
  Administered 2016-01-29: 13:00:00 via INTRAVENOUS

## 2016-01-29 MED ORDER — DIPHENOXYLATE-ATROPINE 2.5-0.025 MG PO TABS: 1.0000 | ORAL_TABLET | Freq: Four times a day (QID) | ORAL | Status: DC | PRN

## 2016-01-29 MED ORDER — SODIUM CHLORIDE 0.9 % IV SOLN: Freq: Once | INTRAVENOUS | Status: AC

## 2016-01-29 MED ORDER — SODIUM CHLORIDE 0.9% FLUSH
10.0000 mL | INTRAVENOUS | Status: DC | PRN
  Administered 2016-01-29: 10 mL via INTRAVENOUS
  Filled 2016-01-29: qty 10

## 2016-01-29 MED ORDER — SODIUM CHLORIDE 0.9 % IV SOLN
Freq: Once | INTRAVENOUS | Status: AC
  Administered 2016-01-29: 12:00:00 via INTRAVENOUS
  Filled 2016-01-29: qty 500

## 2016-01-29 MED ORDER — SODIUM CHLORIDE 0.9% FLUSH
10.0000 mL | INTRAVENOUS | Status: AC | PRN
  Administered 2016-01-29: 10 mL
  Filled 2016-01-29: qty 10

## 2016-01-29 NOTE — Patient Instructions (Signed)

## 2016-01-30 ENCOUNTER — Telehealth: Payer: Self-pay | Admitting: Hematology

## 2016-01-30 ENCOUNTER — Telehealth: Payer: Self-pay | Admitting: *Deleted

## 2016-01-30 NOTE — Telephone Encounter (Signed)
Spoke with patient to confirm appt for 4/5 per 4/3 pof

## 2016-01-30 NOTE — Telephone Encounter (Signed)
Per staff phone call and POF I have schedueld appts. Scheduler advised of appts.  JMW  

## 2016-01-31 ENCOUNTER — Other Ambulatory Visit (HOSPITAL_BASED_OUTPATIENT_CLINIC_OR_DEPARTMENT_OTHER): Payer: PPO

## 2016-01-31 ENCOUNTER — Other Ambulatory Visit: Payer: Self-pay | Admitting: Nurse Practitioner

## 2016-01-31 ENCOUNTER — Ambulatory Visit (HOSPITAL_BASED_OUTPATIENT_CLINIC_OR_DEPARTMENT_OTHER): Payer: PPO | Admitting: Nurse Practitioner

## 2016-01-31 ENCOUNTER — Ambulatory Visit (HOSPITAL_BASED_OUTPATIENT_CLINIC_OR_DEPARTMENT_OTHER): Payer: PPO

## 2016-01-31 VITALS — BP 124/74 | HR 102 | Temp 98.1°F | Resp 18 | Ht 65.0 in | Wt 131.3 lb

## 2016-01-31 VITALS — BP 151/71 | HR 53 | Temp 97.3°F | Resp 20

## 2016-01-31 DIAGNOSIS — R530 Neoplastic (malignant) related fatigue: Secondary | ICD-10-CM

## 2016-01-31 DIAGNOSIS — R630 Anorexia: Secondary | ICD-10-CM | POA: Diagnosis not present

## 2016-01-31 DIAGNOSIS — E860 Dehydration: Secondary | ICD-10-CM

## 2016-01-31 DIAGNOSIS — C187 Malignant neoplasm of sigmoid colon: Secondary | ICD-10-CM

## 2016-01-31 DIAGNOSIS — R197 Diarrhea, unspecified: Secondary | ICD-10-CM

## 2016-01-31 LAB — CBC WITH DIFFERENTIAL/PLATELET
BASO%: 0.1 % (ref 0.0–2.0)
BASOS ABS: 0 10*3/uL (ref 0.0–0.1)
EOS%: 0.8 % (ref 0.0–7.0)
Eosinophils Absolute: 0 10*3/uL (ref 0.0–0.5)
HEMATOCRIT: 29 % — AB (ref 38.4–49.9)
HGB: 9.7 g/dL — ABNORMAL LOW (ref 13.0–17.1)
LYMPH%: 19.1 % (ref 14.0–49.0)
MCH: 28.9 pg (ref 27.2–33.4)
MCHC: 33.4 g/dL (ref 32.0–36.0)
MCV: 86.6 fL (ref 79.3–98.0)
MONO#: 1.1 10*3/uL — AB (ref 0.1–0.9)
MONO%: 18.2 % — ABNORMAL HIGH (ref 0.0–14.0)
NEUT#: 3.8 10*3/uL (ref 1.5–6.5)
NEUT%: 61.8 % (ref 39.0–75.0)
PLATELETS: 235 10*3/uL (ref 140–400)
RBC: 3.35 10*6/uL — AB (ref 4.20–5.82)
RDW: 18.2 % — ABNORMAL HIGH (ref 11.0–14.6)
WBC: 6.2 10*3/uL (ref 4.0–10.3)
lymph#: 1.2 10*3/uL (ref 0.9–3.3)

## 2016-01-31 LAB — COMPREHENSIVE METABOLIC PANEL
ALT: 22 U/L (ref 0–55)
ANION GAP: 8 meq/L (ref 3–11)
AST: 18 U/L (ref 5–34)
Albumin: 3.2 g/dL — ABNORMAL LOW (ref 3.5–5.0)
Alkaline Phosphatase: 185 U/L — ABNORMAL HIGH (ref 40–150)
BUN: 13.2 mg/dL (ref 7.0–26.0)
CALCIUM: 9 mg/dL (ref 8.4–10.4)
CO2: 21 meq/L — AB (ref 22–29)
CREATININE: 0.9 mg/dL (ref 0.7–1.3)
Chloride: 103 mEq/L (ref 98–109)
EGFR: 89 mL/min/{1.73_m2} — AB (ref >90)
Glucose: 125 mg/dl (ref 70–140)
POTASSIUM: 4.1 meq/L (ref 3.5–5.1)
Sodium: 132 mEq/L — ABNORMAL LOW (ref 136–145)
Total Bilirubin: 1.04 mg/dL (ref 0.20–1.20)
Total Protein: 7.4 g/dL (ref 6.4–8.3)

## 2016-01-31 LAB — MAGNESIUM: MAGNESIUM: 0.7 mg/dL — AB (ref 1.5–2.5)

## 2016-01-31 MED ORDER — HEPARIN SOD (PORK) LOCK FLUSH 100 UNIT/ML IV SOLN
500.0000 [IU] | Freq: Once | INTRAVENOUS | Status: AC
  Administered 2016-01-31: 500 [IU] via INTRAVENOUS
  Filled 2016-01-31: qty 5

## 2016-01-31 MED ORDER — SODIUM CHLORIDE 0.9 % IV SOLN
Freq: Once | INTRAVENOUS | Status: AC
  Administered 2016-01-31: 14:00:00 via INTRAVENOUS

## 2016-01-31 MED ORDER — SODIUM CHLORIDE 0.9% FLUSH
10.0000 mL | INTRAVENOUS | Status: DC | PRN
  Administered 2016-01-31: 10 mL via INTRAVENOUS
  Filled 2016-01-31: qty 10

## 2016-01-31 MED ORDER — SODIUM CHLORIDE 0.9 % IV SOLN
6.0000 g | Freq: Once | INTRAVENOUS | Status: AC
  Administered 2016-01-31: 6 g via INTRAVENOUS
  Filled 2016-01-31: qty 12

## 2016-02-01 ENCOUNTER — Encounter: Payer: Self-pay | Admitting: Nurse Practitioner

## 2016-02-01 DIAGNOSIS — R530 Neoplastic (malignant) related fatigue: Secondary | ICD-10-CM | POA: Insufficient documentation

## 2016-02-01 DIAGNOSIS — R197 Diarrhea, unspecified: Secondary | ICD-10-CM | POA: Insufficient documentation

## 2016-02-01 NOTE — Assessment & Plan Note (Signed)
Patient has had chronic hypomagnesemia since initiating the Panitumumab and Irinotecan.  He has been taking one magnesium tablet 3 times per day.  He has also been receiving magnesium IV on a very frequent basis as well.  Magnesium level today was down to 0.7.  Patient will receive magnesium 6 g over 3 hours in addition to his normal saline IV fluid rehydration.  Patient was encouraged to continue taking the magnesium tablets at home as prescribed.

## 2016-02-01 NOTE — Assessment & Plan Note (Signed)
Patient received cycle 9 of his irinotecan/panitumumab infusion on 01/05/2016.  Labs obtained today were all within normal limits; with the exception of the magnesium.  See further notes for details.  Patient is scheduled to return on 02/05/2016 for labs, flush, visit, and his next cycle of chemotherapy.

## 2016-02-01 NOTE — Assessment & Plan Note (Signed)
Patient has been experiencing chronic diarrhea since initiating his chemotherapy regimen.  He has been taking Imodium and occasional Lomotil as directed.  Patient also suffers with chronic low magnesium; and takes magnesium tablets 3 times per day as directed.  Patient appears slightly dehydrated today; and will receive IV fluid rehydration.  He was advised to continue with the Imodium and Lomotil as directed.

## 2016-02-01 NOTE — Progress Notes (Signed)
SYMPTOM MANAGEMENT CLINIC    Chief Complaint: Fatigue   HPI:  Jason Reilly 70 y.o. 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  Currently undergoing panitumumab /irinotecan regimen.  Patient presents to the [facility] today with complaint of continued, chronic fatigue, poor appetite, and dehydration.  Patient denies any nausea or vomiting.  He states that he did has chronic diarrhea; and takes Imodium on an as-needed basis.  He has also alternated with Lomotil on occasion as well.  Patient suffers with chronic low magnesium; and takes 1.  Magnesium tablet 3 times per day as directed.  He denies any recent fevers or chills.   Oncology History   He is a Metastatic colon cancer to liver   Staging form: Colon and Rectum, AJCC 7th Edition     Clinical: Stage Unknown (TX, NX, M1) - Unsigned      Cancer of sigmoid colon ([place])   10/28/2014 Tumor Marker AFP 3.2 CEA > 10,000 CA 19-9 12,929.6. tumor (-) KRAS and NRAS mutation.    11/10/2014 Imaging PET scan showed hypermetabolic mass in the sigmoid colon was noted metastasis in the retroperitoneum. Probable left adrenal and pulmonary metastasis, and diffuse liver metastasis.   11/21/2014 Initial Diagnosis Metastatic colon cancer to liver, lung, abd nodes and left adrenal gland. Diagnosis was made by liver biopsy.    11/30/2014 -  Chemotherapy First line chemo [id number], Panitumumab added from second cycle    12/28/2014 - 12/31/2014 Hospital Admission Was admitted for dehydration, neutropenia fever with UTI, and severe skin rash.   02/08/2015 - 02/12/2015 Hospital Admission He was admitted for upper GI bleeding, e.g. showed a gastric ulcer with clots, status post appendectomy injection. He also received a blood transfusion.   03/01/2015 Tumor Marker CEA 694, CA19.9 462   03/13/2015 Imaging CT CAP showed partial response, no new lesions.    03/15/2015 - 05/23/2015 Chemotherapy restart FOLFOX, held again on 8/9 due to GI bleeding    05/31/2015 - 06/02/2015 Hospital Admission He was admitted to [facility] in [place] due to upper GI bleeding, EGD showed gastric and dudenal ulcers    06/27/2015 - 06/29/2015 Hospital Admission he was admitted for dairrhea and pancolitis, c-diff and stool cultures were negative, treated with antibiotics   07/12/2015 -  Chemotherapy panitumumab 26m/kg, every 2 weeks   11/08/2015 - 11/10/2015 Hospital Admission Pt was admitted for sepsis and hypotension, was found to have (+) influenza A and treated.    11/30/2015 Imaging  CT scans reviewed continued improvement in the liver metastasis, no other new lesions.    Review of Systems  Constitutional: Positive for malaise/fatigue.  Gastrointestinal: Positive for diarrhea. Negative for abdominal pain.  Neurological: Positive for weakness.  All other systems reviewed and are negative.   Past Medical History  Diagnosis Date  . Tachycardia   . Abnormal EKG   . Hypertension   . Non-ischemic cardiomyopathy ([place]   . Hepatitis B   . H. pylori infection   . Gastric ulcer   . Metastatic colon cancer to liver ([place]   . Diabetes mellitus without complication ([place]     metformin    Past Surgical History  Procedure Laterality Date  . Nuclear stress test  03/03/2013    High risk - consistent with nonischemic cardiomyopathy  . Left and right heart catheterization with coronary angiogram N/A 03/29/2013    Procedure: LEFT AND RIGHT HEART CATHETERIZATION WITH CORONARY ANGIOGRAM;  Surgeon: KPixie Casino MD;  [facility];  Service: Cardiovascular;  Laterality: N/A;  . Esophagogastroduodenoscopy N/A 02/08/2015    Procedure: ESOPHAGOGASTRODUODENOSCOPY (EGD);  Surgeon: Ladene Artist, MD;  Location: [facility];  Service: Endoscopy;  Laterality: N/A;  . Esophagogastroduodenoscopy (egd) with propofol N/A 08/15/2015    Procedure: ESOPHAGOGASTRODUODENOSCOPY (EGD) WITH PROPOFOL;  Surgeon: Ladene Artist, MD;  Location: [facility] ENDOSCOPY;  Service: Endoscopy;   Laterality: N/A;    has Hypertension; Abnormal nuclear cardiac imaging test; Acute systolic heart failure ([place]); Non-ischemic cardiomyopathy ([place]); Cancer of sigmoid colon ([place]); Acute renal failure ([place]); Hyponatremia; Weakness; Acute blood loss anemia; Leukocytosis; Chronic combined systolic and diastolic heart failure ([place]); Arterial hypotension; Gastric ulcer with hemorrhage but without obstruction; Upper GI bleed; Absolute anemia; Nausea with vomiting; Dehydration; Hyperphosphatemia; Rash; Anemia, iron deficiency; Acute colitis; Duodenal ulcer without hemorrhage, perforation, or obstruction; Esophageal varices ([place]); Gastric ulcer with hemorrhage; Hypotension; Sepsis ([place]); Acute on chronic systolic (congestive) heart failure ([place]); Leukopenia due to antineoplastic chemotherapy; Influenza A; Cancer of sigmoid ([place]); Diabetes mellitus, type 2 ([place]); Hypomagnesemia; Hypoalbuminemia due to protein-calorie malnutrition ([place]); Neoplastic malignant related fatigue; and Diarrhea on his problem list.    has No Known Allergies.    Medication List       This list is accurate as of: 01/31/16 11:59 PM.  Always use your most recent med list.               clindamycin 1 % gel  Commonly known as:  CLINDAGEL  Apply topically 2 (two) times daily.     diphenoxylate-atropine 2.5-0.025 MG tablet  Commonly known as:  LOMOTIL  Take 1-2 tablets by mouth 4 (four) times daily as needed for diarrhea or loose stools.     folic acid 1 MG tablet  Commonly known as:  FOLVITE  Take 1 tablet (1 mg total) by mouth daily.     hydrocortisone 1 % ointment  Apply 1 application topically 2 (two) times daily.     lidocaine-prilocaine cream  Commonly known as:  EMLA  Apply 1 application topically as needed. Apply to [facility] cath at least one hour before needle stick.     lisinopril-hydrochlorothiazide 10-12.5 MG tablet  Commonly known as:  PRINZIDE,ZESTORETIC     loperamide 2 MG capsule  Commonly known as:  IMODIUM   Take 2 capsules (4 mg total) by mouth 4 (four) times daily as needed for diarrhea or loose stools (NO MORE THAN 8 TABLETS PER DAY.).     magic mouthwash Soln  Take 5 mLs by mouth 4 (four) times daily.     magnesium oxide 400 (241.3 Mg) MG tablet  Commonly known as:  MAG-OX  Take 1 tablet (400 mg total) by mouth 3 (three) times daily.     metFORMIN 500 MG tablet  Commonly known as:  GLUCOPHAGE  Take 1 tablet (500 mg total) by mouth 2 (two) times daily with a meal. THIS IS A ONE TIME ORDER.  FUTURE REFILLS NEED TO BE DONE BY PRIMARY MD.     nadolol 20 MG tablet  Commonly known as:  CORGARD  Take 1 tablet (20 mg total) by mouth 2 (two) times daily. THIS IS A ONE TIME ORDER.  FUTURE REFILLS NEED TO BE DONE BY PRIMARY MD.     ondansetron 8 MG tablet  Commonly known as:  ZOFRAN  Take 1 tablet (8 mg total) by mouth every 8 (eight) hours as needed for nausea or vomiting.     pantoprazole 40 MG tablet  Commonly known as:  PROTONIX  Take  1 tablet (40 mg total) by mouth daily.     prochlorperazine 10 MG tablet  Commonly known as:  COMPAZINE  Take 1 tablet (10 mg total) by mouth every 6 (six) hours as needed for nausea or vomiting.         PHYSICAL EXAMINATION  Oncology Vitals 01/31/2016 01/31/2016  Height - -  Weight - -  Weight (lbs) - -  BMI (kg/m2) - -  Temp - 97.3  Pulse 53 103  Resp 20 18  SpO2 99 100  BSA (m2) - -   BP Readings from Last 2 Encounters:  01/31/16 151/71  01/31/16 124/74    Physical Exam  Constitutional: He is oriented to person, place, and time. Vital signs are normal. He appears dehydrated. He appears unhealthy.  HENT:  Head: Normocephalic and atraumatic.  Mouth/Throat: Oropharynx is clear and moist.  Eyes: Conjunctivae and EOM are normal. Pupils are equal, round, and reactive to light. Right eye exhibits no discharge. Left eye exhibits no discharge. No scleral icterus.  Neck: Normal range of motion. Neck supple. No JVD present. No tracheal deviation  present. No thyromegaly present.  Cardiovascular: Normal rate, regular rhythm, normal heart sounds and intact distal pulses.   Pulmonary/Chest: Effort normal and breath sounds normal. No respiratory distress. He has no wheezes. He has no rales. He exhibits no tenderness.  Abdominal: Soft. Bowel sounds are normal. He exhibits no distension and no mass. There is no tenderness. There is no rebound and no guarding.  Musculoskeletal: Normal range of motion. He exhibits no edema or tenderness.  Lymphadenopathy:    He has no cervical adenopathy.  Neurological: He is alert and oriented to person, place, and time. Gait normal.  Skin: Skin is warm and dry. No rash noted. No erythema. No pallor.  Psychiatric: Affect normal.  Nursing note and vitals reviewed.   LABORATORY DATA:. Appointment on 01/31/2016  Component Date Value Ref Range Status  . Magnesium 01/31/2016 0.7* 1.5 - 2.5 mg/dl Final  . WBC 01/31/2016 6.2  4.0 - 10.3 10e3/uL Final  . NEUT# 01/31/2016 3.8  1.5 - 6.5 10e3/uL Final  . HGB 01/31/2016 9.7* 13.0 - 17.1 g/dL Final  . HCT 01/31/2016 29.0* 38.4 - 49.9 % Final  . Platelets 01/31/2016 235  140 - 400 10e3/uL Final  . MCV 01/31/2016 86.6  79.3 - 98.0 fL Final  . MCH 01/31/2016 28.9  27.2 - 33.4 pg Final  . MCHC 01/31/2016 33.4  32.0 - 36.0 g/dL Final  . RBC 01/31/2016 3.35* 4.20 - 5.82 10e6/uL Final  . RDW 01/31/2016 18.2* 11.0 - 14.6 % Final  . lymph# 01/31/2016 1.2  0.9 - 3.3 10e3/uL Final  . MONO# 01/31/2016 1.1* 0.1 - 0.9 10e3/uL Final  . Eosinophils Absolute 01/31/2016 0.0  0.0 - 0.5 10e3/uL Final  . Basophils Absolute 01/31/2016 0.0  0.0 - 0.1 10e3/uL Final  . NEUT% 01/31/2016 61.8  39.0 - 75.0 % Final  . LYMPH% 01/31/2016 19.1  14.0 - 49.0 % Final  . MONO% 01/31/2016 18.2* 0.0 - 14.0 % Final  . EOS% 01/31/2016 0.8  0.0 - 7.0 % Final  . BASO% 01/31/2016 0.1  0.0 - 2.0 % Final  . Sodium 01/31/2016 132* 136 - 145 mEq/L Final  . Potassium 01/31/2016 4.1  3.5 - 5.1 mEq/L Final   . Chloride 01/31/2016 103  98 - 109 mEq/L Final  . CO2 01/31/2016 21* 22 - 29 mEq/L Final  . Glucose 01/31/2016 125  70 - 140 mg/dl Final  Glucose reference range is for nonfasting patients. Fasting glucose reference range is 70- 100.  Marland Kitchen BUN 01/31/2016 13.2  7.0 - 26.0 mg/dL Final  . Creatinine 01/31/2016 0.9  0.7 - 1.3 mg/dL Final  . Total Bilirubin 01/31/2016 1.04  0.20 - 1.20 mg/dL Final  . Alkaline Phosphatase 01/31/2016 185* 40 - 150 U/L Final  . AST 01/31/2016 18  5 - 34 U/L Final  . ALT 01/31/2016 22  0 - 55 U/L Final  . Total Protein 01/31/2016 7.4  6.4 - 8.3 g/dL Final  . Albumin 01/31/2016 3.2* 3.5 - 5.0 g/dL Final  . Calcium 01/31/2016 9.0  8.4 - 10.4 mg/dL Final  . Anion Gap 01/31/2016 8  3 - 11 mEq/L Final  . EGFR 01/31/2016 89* >90 ml/min/1.73 m2 Final   eGFR is calculated using the CKD-EPI Creatinine Equation (2009)  Appointment on 01/29/2016  Component Date Value Ref Range Status  . WBC 01/29/2016 4.8  4.0 - 10.3 10e3/uL Final  . NEUT# 01/29/2016 3.1  1.5 - 6.5 10e3/uL Final  . HGB 01/29/2016 9.8* 13.0 - 17.1 g/dL Final  . HCT 01/29/2016 29.4* 38.4 - 49.9 % Final  . Platelets 01/29/2016 208  140 - 400 10e3/uL Final  . MCV 01/29/2016 87.5  79.3 - 98.0 fL Final  . MCH 01/29/2016 29.3  27.2 - 33.4 pg Final  . MCHC 01/29/2016 33.5  32.0 - 36.0 g/dL Final  . RBC 01/29/2016 3.36* 4.20 - 5.82 10e6/uL Final  . RDW 01/29/2016 18.4* 11.0 - 14.6 % Final  . lymph# 01/29/2016 0.9  0.9 - 3.3 10e3/uL Final  . MONO# 01/29/2016 0.7  0.1 - 0.9 10e3/uL Final  . Eosinophils Absolute 01/29/2016 0.1  0.0 - 0.5 10e3/uL Final  . Basophils Absolute 01/29/2016 0.0  0.0 - 0.1 10e3/uL Final  . NEUT% 01/29/2016 64.6  39.0 - 75.0 % Final  . LYMPH% 01/29/2016 18.1  14.0 - 49.0 % Final  . MONO% 01/29/2016 14.3* 0.0 - 14.0 % Final  . EOS% 01/29/2016 2.8  0.0 - 7.0 % Final  . BASO% 01/29/2016 0.2  0.0 - 2.0 % Final  . Magnesium 01/29/2016 1.0* 1.5 - 2.5 mg/dl Final  . Ferritin 01/29/2016  1,332* 22 - 316 ng/ml Final    RADIOGRAPHIC STUDIES: No results found.  ASSESSMENT/PLAN:    Cancer of sigmoid colon [facility]) Patient received cycle 9 of his irinotecan/panitumumab infusion on 01/05/2016.  Labs obtained today were all within normal limits; with the exception of the magnesium.  See further notes for details.  Patient is scheduled to return on 02/05/2016 for labs, flush, visit, and his next cycle of chemotherapy.  Dehydration Patient has had decreased appetite and decreased oral intake as well.  He feels slightly dehydrated today.  Sodium was 132 with lab check today.  Patient will receive IV fluid rehydration while the cancer Center today.  Is also encouraged to push fluids is much as possible.  Hypomagnesemia Patient has had chronic hypomagnesemia since initiating the Panitumumab and Irinotecan.  He has been taking one magnesium tablet 3 times per day.  He has also been receiving magnesium IV on a very frequent basis as well.  Magnesium level today was down to 0.7.  Patient will receive magnesium 6 g over 3 hours in addition to his normal saline IV fluid rehydration.  Patient was encouraged to continue taking the magnesium tablets at home as prescribed.  Neoplastic malignant related fatigue Patient continues to complain of chronic fatigue.  This is most likely secondary  to his cancer and cancer treatment.  Also, patient's low magnesium and dehydration could also be a factor.  Patient was encouraged to remain as active as possible.  Diarrhea Patient has been experiencing chronic diarrhea since initiating his chemotherapy regimen.  He has been taking Imodium and occasional Lomotil as directed.  Patient also suffers with chronic low magnesium; and takes magnesium tablets 3 times per day as directed.  Patient appears slightly dehydrated today; and will receive IV fluid rehydration.  He was advised to continue with the Imodium and Lomotil as directed.   Patient stated  understanding of all instructions; and was in agreement with this plan of care. The patient knows to call the clinic with any problems, questions or concerns.   Review/collaboration with [name] regarding all aspects of patient's visit today.   Total time spent with patient was 25 minutes;  with greater than 75 percent of that time spent in face to face counseling regarding patient's symptoms,  and coordination of care and follow up.  Disclaimer:This dictation was prepared with Dragon/digital dictation along with [employer]. Any transcriptional errors that result from this process are unintentional.  Drue Second, NP 02/01/2016

## 2016-02-01 NOTE — Assessment & Plan Note (Signed)
Patient continues to complain of chronic fatigue.  This is most likely secondary to his cancer and cancer treatment.  Also, patient's low magnesium and dehydration could also be a factor.  Patient was encouraged to remain as active as possible.

## 2016-02-01 NOTE — Assessment & Plan Note (Signed)
Patient has had decreased appetite and decreased oral intake as well.  He feels slightly dehydrated today.  Sodium was 132 with lab check today.  Patient will receive IV fluid rehydration while the cancer Center today.  Is also encouraged to push fluids is much as possible.

## 2016-02-05 ENCOUNTER — Other Ambulatory Visit: Payer: Self-pay | Admitting: *Deleted

## 2016-02-05 ENCOUNTER — Other Ambulatory Visit (HOSPITAL_BASED_OUTPATIENT_CLINIC_OR_DEPARTMENT_OTHER): Payer: PPO

## 2016-02-05 ENCOUNTER — Ambulatory Visit: Payer: PPO

## 2016-02-05 ENCOUNTER — Telehealth: Payer: Self-pay | Admitting: Hematology

## 2016-02-05 ENCOUNTER — Ambulatory Visit (HOSPITAL_BASED_OUTPATIENT_CLINIC_OR_DEPARTMENT_OTHER): Payer: PPO

## 2016-02-05 ENCOUNTER — Encounter: Payer: Self-pay | Admitting: Hematology

## 2016-02-05 ENCOUNTER — Ambulatory Visit (HOSPITAL_COMMUNITY)
Admission: RE | Admit: 2016-02-05 | Discharge: 2016-02-05 | Disposition: A | Discharge status: Home / Self Care | Payer: PPO | Source: Ambulatory Visit | Attending: Hematology | Admitting: Hematology

## 2016-02-05 ENCOUNTER — Other Ambulatory Visit: Payer: PPO

## 2016-02-05 ENCOUNTER — Ambulatory Visit (HOSPITAL_BASED_OUTPATIENT_CLINIC_OR_DEPARTMENT_OTHER): Payer: PPO | Admitting: Hematology

## 2016-02-05 VITALS — BP 125/65 | HR 81 | Temp 98.1°F | Resp 18 | Wt 139.4 lb

## 2016-02-05 DIAGNOSIS — D500 Iron deficiency anemia secondary to blood loss (chronic): Secondary | ICD-10-CM

## 2016-02-05 DIAGNOSIS — E119 Type 2 diabetes mellitus without complications: Secondary | ICD-10-CM

## 2016-02-05 DIAGNOSIS — C187 Malignant neoplasm of sigmoid colon: Secondary | ICD-10-CM

## 2016-02-05 DIAGNOSIS — D509 Iron deficiency anemia, unspecified: Secondary | ICD-10-CM

## 2016-02-05 DIAGNOSIS — C787 Secondary malignant neoplasm of liver and intrahepatic bile duct: Secondary | ICD-10-CM

## 2016-02-05 DIAGNOSIS — I5042 Chronic combined systolic (congestive) and diastolic (congestive) heart failure: Secondary | ICD-10-CM

## 2016-02-05 DIAGNOSIS — D6481 Anemia due to antineoplastic chemotherapy: Secondary | ICD-10-CM

## 2016-02-05 DIAGNOSIS — Z5111 Encounter for antineoplastic chemotherapy: Secondary | ICD-10-CM | POA: Diagnosis not present

## 2016-02-05 DIAGNOSIS — C7800 Secondary malignant neoplasm of unspecified lung: Secondary | ICD-10-CM | POA: Diagnosis not present

## 2016-02-05 DIAGNOSIS — I10 Essential (primary) hypertension: Secondary | ICD-10-CM

## 2016-02-05 DIAGNOSIS — C772 Secondary and unspecified malignant neoplasm of intra-abdominal lymph nodes: Secondary | ICD-10-CM

## 2016-02-05 DIAGNOSIS — Z5112 Encounter for antineoplastic immunotherapy: Secondary | ICD-10-CM

## 2016-02-05 DIAGNOSIS — R21 Rash and other nonspecific skin eruption: Secondary | ICD-10-CM

## 2016-02-05 DIAGNOSIS — C7972 Secondary malignant neoplasm of left adrenal gland: Secondary | ICD-10-CM

## 2016-02-05 DIAGNOSIS — Z95828 Presence of other vascular implants and grafts: Secondary | ICD-10-CM

## 2016-02-05 DIAGNOSIS — K254 Chronic or unspecified gastric ulcer with hemorrhage: Secondary | ICD-10-CM

## 2016-02-05 DIAGNOSIS — C189 Malignant neoplasm of colon, unspecified: Secondary | ICD-10-CM

## 2016-02-05 LAB — CBC WITH DIFFERENTIAL/PLATELET
BASO%: 0.4 % (ref 0.0–2.0)
BASOS ABS: 0 10*3/uL (ref 0.0–0.1)
EOS%: 1.4 % (ref 0.0–7.0)
Eosinophils Absolute: 0 10*3/uL (ref 0.0–0.5)
HEMATOCRIT: 23.9 % — AB (ref 38.4–49.9)
HEMOGLOBIN: 8 g/dL — AB (ref 13.0–17.1)
LYMPH#: 0.5 10*3/uL — AB (ref 0.9–3.3)
LYMPH%: 17.2 % (ref 14.0–49.0)
MCH: 29 pg (ref 27.2–33.4)
MCHC: 33.4 g/dL (ref 32.0–36.0)
MCV: 86.8 fL (ref 79.3–98.0)
MONO#: 0.3 10*3/uL (ref 0.1–0.9)
MONO%: 12 % (ref 0.0–14.0)
NEUT#: 1.9 10*3/uL (ref 1.5–6.5)
NEUT%: 69 % (ref 39.0–75.0)
PLATELETS: 218 10*3/uL (ref 140–400)
RBC: 2.75 10*6/uL — ABNORMAL LOW (ref 4.20–5.82)
RDW: 18.4 % — AB (ref 11.0–14.6)
WBC: 2.7 10*3/uL — ABNORMAL LOW (ref 4.0–10.3)

## 2016-02-05 LAB — MAGNESIUM

## 2016-02-05 MED ORDER — IRINOTECAN HCL CHEMO INJECTION 100 MG/5ML
118.0000 mg/m2 | Freq: Once | INTRAVENOUS | Status: AC
  Administered 2016-02-05: 200 mg via INTRAVENOUS
  Filled 2016-02-05: qty 10

## 2016-02-05 MED ORDER — SODIUM CHLORIDE 0.9 % IV SOLN
Freq: Once | INTRAVENOUS | Status: AC
  Administered 2016-02-05: 10:00:00 via INTRAVENOUS

## 2016-02-05 MED ORDER — SODIUM CHLORIDE 0.9 % IJ SOLN
10.0000 mL | INTRAMUSCULAR | Status: DC | PRN
  Administered 2016-02-05: 10 mL
  Filled 2016-02-05: qty 10

## 2016-02-05 MED ORDER — SODIUM CHLORIDE 0.9% FLUSH
10.0000 mL | INTRAVENOUS | Status: DC | PRN
  Administered 2016-02-05: 10 mL via INTRAVENOUS
  Filled 2016-02-05: qty 10

## 2016-02-05 MED ORDER — ATROPINE SULFATE 1 MG/ML IJ SOLN
0.5000 mg | Freq: Once | INTRAMUSCULAR | Status: AC | PRN
  Administered 2016-02-05: 0.5 mg via INTRAVENOUS

## 2016-02-05 MED ORDER — SODIUM CHLORIDE 0.9 % IV SOLN: 6.0000 g | Freq: Once | INTRAVENOUS | Status: DC

## 2016-02-05 MED ORDER — ONDANSETRON HCL 8 MG PO TABS: 8.0000 mg | ORAL_TABLET | Freq: Three times a day (TID) | ORAL | Status: AC | PRN

## 2016-02-05 MED ORDER — ATROPINE SULFATE 1 MG/ML IJ SOLN
INTRAMUSCULAR | Status: AC
  Filled 2016-02-05: qty 1

## 2016-02-05 MED ORDER — SODIUM CHLORIDE 0.9 % IV SOLN
Freq: Once | INTRAVENOUS | Status: AC
  Administered 2016-02-05: 12:00:00 via INTRAVENOUS
  Filled 2016-02-05: qty 500

## 2016-02-05 MED ORDER — SODIUM CHLORIDE 0.9 % IV SOLN
Freq: Once | INTRAVENOUS | Status: AC
  Administered 2016-02-05: 10:00:00 via INTRAVENOUS
  Filled 2016-02-05: qty 4

## 2016-02-05 MED ORDER — SODIUM CHLORIDE 0.9 % IV SOLN
6.1000 mg/kg | Freq: Once | INTRAVENOUS | Status: AC
  Administered 2016-02-05: 400 mg via INTRAVENOUS
  Filled 2016-02-05: qty 20

## 2016-02-05 MED ORDER — HEPARIN SOD (PORK) LOCK FLUSH 100 UNIT/ML IV SOLN
500.0000 [IU] | Freq: Once | INTRAVENOUS | Status: AC | PRN
  Administered 2016-02-05: 500 [IU]
  Filled 2016-02-05: qty 5

## 2016-02-05 MED ORDER — PROCHLORPERAZINE MALEATE 10 MG PO TABS: 10.0000 mg | ORAL_TABLET | Freq: Four times a day (QID) | ORAL | Status: DC | PRN

## 2016-02-05 NOTE — Progress Notes (Signed)
[place]  Telephone:(336) [phone] [phone]) [phone]  Clinic follow up Note   Patient Care Team: [name], MD as PCP - General (Specialist) [name], CRNP as Nurse Practitioner (Nurse Practitioner) Truitt Merle, MD as Consulting Physician (Hematology) Ladene Artist, MD as Consulting Physician (Gastroenterology) 02/05/2016  CHIEF COMPLAINTS Follow up metastatic sigmoid colon cancer  Oncology History   He is a Metastatic colon cancer to liver   Staging form: Colon and Rectum, AJCC 7th Edition     Clinical: Stage Unknown ([place], NX, M1) - Unsigned      Cancer of sigmoid colon ([place])   10/28/2014 Tumor Marker AFP 3.2 CEA > 10,000 CA 19-9 12,929.6. tumor (-) KRAS and NRAS mutation.    11/10/2014 Imaging PET scan showed hypermetabolic mass in the sigmoid colon was noted metastasis in the retroperitoneum. Probable left adrenal and pulmonary metastasis, and diffuse liver metastasis.   11/21/2014 Initial Diagnosis Metastatic colon cancer to liver, lung, abd nodes and left adrenal gland. Diagnosis was made by liver biopsy.    11/30/2014 -  Chemotherapy First line chemo [id number], Panitumumab added from second cycle    12/28/2014 - 12/31/2014 Hospital Admission Was admitted for dehydration, neutropenia fever with UTI, and severe skin rash.   02/08/2015 - 02/12/2015 Hospital Admission He was admitted for upper GI bleeding, e.g. showed a gastric ulcer with clots, status post appendectomy injection. He also received a blood transfusion.   03/01/2015 Tumor Marker CEA 694, CA19.9 462   03/13/2015 Imaging CT CAP showed partial response, no new lesions.    03/15/2015 - 05/23/2015 Chemotherapy restart FOLFOX, held again on 8/9 due to GI bleeding    05/31/2015 - 06/02/2015 Hospital Admission He was admitted to [facility] in [place] due to upper GI bleeding, EGD showed gastric and dudenal ulcers    06/27/2015 - 06/29/2015 Hospital Admission he was admitted for dairrhea and pancolitis, c-diff and  stool cultures were negative, treated with antibiotics   07/12/2015 -  Chemotherapy panitumumab 23m/kg, every 2 weeks   11/08/2015 - 11/10/2015 Hospital Admission Pt was admitted for sepsis and hypotension, was found to have (+) influenza A and treated.    11/30/2015 Imaging  CT scans reviewed continued improvement in the liver metastasis, no other new lesions.     HISTORY OF PRESENTING ILLNESS:  PMauriceAdup 70y.o. male is here because of abnormal CT findings, which is very suspicious for malignancy. [place] He came in with his son and an interpreter.  He has been feeling fatigued since two month ago. He is still able to do all ADLs. He otherwise denies any pain, bloating or nausea.  He lost about 20lbs in 3 month. His appetite is lower than before, eats less, no change of his bowl habits.  She denied any hematochezia or melana. Per his son, he has had some personality changes daily, irritable, slightly confused some time.  He was evaluated by his primary care physician. Lab test reviewed hepatitis B infection, which he did not know before, and elevated alkaline phosphatase, his liver function and the rest of the liver function was not remarkable. [place] was obtained on 07/22/2014, which showed diffusely abnormal liver with multiple echogenic lesions. CT of abdomen with and without contrast was done on 08/26/2014, which reviewed here at a medically with multiple large partially calcified hepatic masses consistent with metastatic disease. Mild retroperitoneal adenopathy with the largest node measuring 1.6  cm. And nonspecific 1.4 cm left adrenal nodule was also noticed. His tumor marker showed CEA greater than 10,000, CA 19-9 12,929, AFP 3.2 (normal). He was referred to [place] [facility] liver clinic and was evaluated by nurse practitioner [name]. Treatment for hepatitis B was not recommended based on his virus load.  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 He was evaluated by a cardiologist in 2014. He denies any significant dyspnea on exertion. No leg swollen.  CURRENT THERAPY: panitumumab 18m/kg, every 2 weeks, started on 07/12/2015, Irinotecan 1868mm2 every 2 weeks added on 09/19/2015, dose decreased to 16031m2 from cycle 2, and further decreased to 125m66m from cycle 10 due to diarrhea and anorexia.  He also receives IV magnesium 6g weekly   INTERIM HISTORY: Paul[place]ajeh[place] for follow-up and chemotherapy. He is accompanied by his son and interpreter to the clinic today.  He had significant anorexia, diarrhea and fatigue after last cycle chemotherapy, was seen by symptom management clinic last week, he recovered well, back to his baseline this week. He has good appetite and eating well. Diarrhea resolved. He lost about 7 pounds last week, and gained back this week. No fever or chills. He has some runny nose was clear discharge, no headaches, cough, or should've breast. No other complaints. He is pretty active at home this week.   MEDICAL HISTORY:  Past Medical History  Diagnosis Date  . Tachycardia   . Abnormal EKG   . Hypertension   . Non-ischemic cardiomyopathy (HCC)[place]. Hepatitis B   . H. pylori infection   . Gastric ulcer   . Metastatic colon cancer to liver (HCC)[place]. Diabetes mellitus without complication (HCC)[place]  metformin    SURGICAL HISTORY: Past Surgical History  Procedure Laterality Date  . Nuclear stress test  03/03/2013    High risk - consistent with nonischemic cardiomyopathy  . Left and right heart catheterization with coronary angiogram N/A 03/29/2013    Procedure: LEFT AND RIGHT HEART CATHETERIZATION WITH CORONARY ANGIOGRAM;  Surgeon: KennPixie Casino;  [facility];  Service: Cardiovascular;  Laterality: N/A;  . Esophagogastroduodenoscopy N/A 02/08/2015    Procedure: ESOPHAGOGASTRODUODENOSCOPY (EGD);  Surgeon: MalcLadene Artist;   [facility];  Service: Endoscopy;  Laterality: N/A;  . Esophagogastroduodenoscopy (egd) with propofol N/A 08/15/2015    Procedure: ESOPHAGOGASTRODUODENOSCOPY (EGD) WITH PROPOFOL;  Surgeon: MalcLadene Artist;  Location: [facility];  Service: Endoscopy;  Laterality: N/A;    SOCIAL HISTORY: Social History   Social History  . Marital Status: Married    Spouse Name: N/A  . Number of Children: N/A  . Years of Education: N/A   Occupational History  . Not on file.   Social History Main Topics  . Smoking status: Former Smoker    Types: Cigarettes    Quit date: 11/07/2008  . Smokeless tobacco: Never Used  . Alcohol Use: Yes     Comment: occasional  . Drug Use: No  . Sexual Activity: No   Other Topics Concern  . Not on file   Social History Narrative   Married   Enjoys walking   [place] lived [place]    FAMILY HISTORY: No family history of liver disease or malignancy.  ALLERGIES:  has No Known Allergies.  MEDICATIONS:  Current Outpatient Prescriptions on File Prior to Visit  Medication Sig Dispense Refill  . clindamycin (CLINDAGEL) 1 % gel Apply topically  2 (two) times daily. 30 g 2  . diphenoxylate-atropine (LOMOTIL) 2.5-0.025 MG tablet Take 1-2 tablets by mouth 4 (four) times daily as needed for diarrhea or loose stools. 30 tablet 0  . folic acid (FOLVITE) 1 MG tablet Take 1 tablet (1 mg total) by mouth daily. 30 tablet 3  . hydrocortisone 1 % ointment Apply 1 application topically 2 (two) times daily. 56 g 0  . lidocaine-prilocaine (EMLA) cream Apply 1 application topically as needed. Apply to [facility] cath at least one hour before needle stick. 30 g 2  . lisinopril-hydrochlorothiazide (PRINZIDE,ZESTORETIC) 10-12.5 MG tablet     . loperamide (IMODIUM) 2 MG capsule Take 2 capsules (4 mg total) by mouth 4 (four) times daily as needed for diarrhea or loose stools (NO MORE THAN 8 TABLETS PER DAY.). 60 capsule 3  . magic mouthwash SOLN Take 5 mLs by mouth 4  (four) times daily. 120 mL 1  . magnesium oxide (MAG-OX) 400 (241.3 Mg) MG tablet Take 1 tablet (400 mg total) by mouth 3 (three) times daily. 60 tablet 3  . metFORMIN (GLUCOPHAGE) 500 MG tablet Take 1 tablet (500 mg total) by mouth 2 (two) times daily with a meal. THIS IS A ONE TIME ORDER.  FUTURE REFILLS NEED TO BE DONE BY PRIMARY MD. 90 tablet 0  . nadolol (CORGARD) 20 MG tablet Take 1 tablet (20 mg total) by mouth 2 (two) times daily. THIS IS A ONE TIME ORDER.  FUTURE REFILLS NEED TO BE DONE BY PRIMARY MD. 90 tablet 0  . ondansetron (ZOFRAN) 8 MG tablet Take 1 tablet (8 mg total) by mouth every 8 (eight) hours as needed for nausea or vomiting. 45 tablet 2  . pantoprazole (PROTONIX) 40 MG tablet Take 1 tablet (40 mg total) by mouth daily. 30 tablet 2  . prochlorperazine (COMPAZINE) 10 MG tablet Take 1 tablet (10 mg total) by mouth every 6 (six) hours as needed for nausea or vomiting. 30 tablet 1   Current Facility-Administered Medications on File Prior to Visit  Medication Dose Route Frequency Provider Last Rate Last Dose  . 0.9 %  sodium chloride infusion   Intravenous Once Truitt Merle, MD      . sodium chloride 0.9 % injection 10 mL  10 mL Intracatheter PRN Truitt Merle, MD   10 mL at 10/03/15 1705  ;   REVIEW OF SYSTEMS:   Constitutional: Denies fevers, chills or abnormal night sweats, (+) fatigue  Eyes: Denies blurriness of vision, double vision or watery eyes Ears, nose, mouth, throat, and face: Denies mucositis or sore throat Respiratory: Denies cough, dyspnea or wheezes Cardiovascular: Denies palpitation, chest discomfort or lower extremity swelling Gastrointestinal: Denies nausea, heartburn or change in bowel habits Skin: Denies abnormal skin rashes Lymphatics: Denies new lymphadenopathy or easy bruising Neurological:Denies numbness, tingling or new weaknesses Behavioral/Psych: Mood is stable, no new changes, (+) insomnia All other systems were reviewed with the patient and are  negative.  PHYSICAL EXAMINATION: BP 125/65 mmHg  Pulse 81  Temp(Src) 98.1 F (36.7 C) (Oral)  Resp 18  Wt 139 lb 6.4 oz (63.231 kg)  SpO2 100%  ECOG PERFORMANCE STATUS: 1 Vital sign were taken at in the infusion room, within normal limits. GENERAL:alert, no distress and comfortable SKIN: (+) Dry skin,  no skin ulcer, (+) diffuse skin rashes on his neck and upper chest, some are resolving with skin pigmentation. No discharge. EYES: normal, conjunctiva are pink and non-injected, sclera clear OROPHARYNX:no exudate, no erythema and lips, buccal mucosa,  and tongue with mild discoloration at the tip.  NECK: supple, thyroid normal size, non-tender, without nodularity LYMPH:  no palpable lymphadenopathy in the cervical, axillary or inguinal LUNGS: clear to auscultation and percussion with normal breathing effort, no rales   HEART: regular rate & rhythm and no murmurs and no lower extremity edema ABDOMEN:abdomen soft, non-tender, no hepatomegaly, no splenomegaly and normal bowel sounds Musculoskeletal:no cyanosis of digits and no clubbing  PSYCH: alert & oriented x 3 with fluent speech NEURO: no focal motor/sensory deficits  LABORATORY DATA:  I have reviewed the data as listed CBC Latest Ref Rng 02/05/2016 01/31/2016 01/29/2016  WBC 4.0 - 10.3 10e3/uL 2.7(L) 6.2 4.8  Hemoglobin 13.0 - 17.1 g/dL 8.0(L) 9.7(L) 9.8(L)  Hematocrit 38.4 - 49.9 % 23.9(L) 29.0(L) 29.4(L)  Platelets 140 - 400 10e3/uL 218 235 208   ANC is 3.4  CMP Latest Ref Rng 01/31/2016 01/22/2016 01/08/2016  Glucose 70 - 140 mg/dl 125 174(H) 110  BUN 7.0 - 26.0 mg/dL 13.2 10.8 12.6  Creatinine 0.7 - 1.3 mg/dL 0.9 0.9 0.7  Sodium 136 - 145 mEq/L 132(L) 138 136  Potassium 3.5 - 5.1 mEq/L 4.1 4.2 3.8  CO2 22 - 29 mEq/L 21(L) 23 25  Calcium 8.4 - 10.4 mg/dL 9.0 8.2(L) 8.4  Total Protein 6.4 - 8.3 g/dL 7.4 7.0 7.0  Total Bilirubin 0.20 - 1.20 mg/dL 1.04 0.64 0.50  Alkaline Phos 40 - 150 U/L 185(H) 167(H) 185(H)  AST 5 - 34 U/L 18  38(H) 32  ALT 0 - 55 U/L 22 34 30   Mag 0.8 today  ANC 2.2 today   INITIAL tumor markers AFP 3.2 CEA > 10,000 CA 19-9 12,929.6  Results for MARWIN, PRIMMER (MRN [id number]) as of 02/05/2016 09:18  Ref. Range 10/03/2015 08:49 10/31/2015 08:01 01/08/2016 09:53  CA 19-9 Latest Ref Range: <35.0 U/mL 62.3 (H) 40.7 (H)   CEA Latest Units: ng/mL 66.4 (H) 50.6 (H) 80.3 (H)  CEA Latest Ref Range: 0.0-4.7 ng/mL   110.6 (H)    Pathology report  Liver, needle/core biopsy - METASTATIC ADENOCARCINOMA, SEE COMMENT. Microscopic Comment The adenocarcinoma demonstrates the following immunophenotype: Cytokeratin 7 - negative expression. Cytokeratin 20 - strong diffuse expression. CD2 - strong diffuse expression. Overall the morphology and immunophenotype are that of metastatic adenocarcinoma primary to colorectum. The recent nuclear medicine scan demonstrating sigmoid mass with associated liver masses is noted.  [facility] test result:    RADIOGRAPHIC STUDIES: I have personally reviewed the outside CT scan image with patient and his son.   CT chest, abdomen and pelvis with IV contrast oN  11/30/2015 IMPRESSION: Mild residual wall thickening involving the distal descending/ proximal sigmoid colon, possibly reflecting a combination residual tumor and/or inflammatory changes.  Improving hepatic metastases, with index lesions as above. Right adrenal metastases, unchanged. Mild abdominopelvic nodal metastases, grossly unchanged.  No evidence of metastatic disease in the chest.  ASSESSMENT & PLAN:  70 year old [place] male, with past history of hypertension and dilated nonischemic gammopathy with EF 25%, no clinical signs of heart failure, who was found to have hepatitis B infection lately, and multiple liver lesions on the CT scan. He has extremely high CEA and CA 19-9 levels. PET scan reviewed a hypermetabolic sigmoid colon mass, diffuse liver metastasis, probable lung and adrenal gland  metastasis.  1. Metastatic sigmoid colon cancer, with diffuse liver, lungs, node and left adrenal gland metastases. KRAS/NRAS wild type, MSI-stable -Liver biopsy showed metastatic adenocarcinoma. His tumor were strongly positive for CK20 and CD2, consistent with  primary colorectal primary. KRAS and NRAS mutations were not detected.  -Pt understands that this is incurable cancer, and he has very high disease burden and overall prognosis is poor. The treatment goal is palliative -He had excellent response to first-line FOLFOX, but treatment was complicated with GI bleeding and neutropenic fever, resolved now. -he is currently on second line chemotherapy with irritecan and panitumumab, tolerating well  - I reviewed his restaging CT scan from 11/30/2015, which showed a continuous responding in liver metastasis, no other new lesions. -He is clinical doing well, lab reviewed, adequate for treatment, we'll continue chemotherapy today. Due to her severe anorexia, diarrhea, weight loss after chemotherapy, I'll reduce his irinotecan dose to 1 25 mg/m from this cycle. - plan to repeat CT scan in 3-4 weeks  2. Grade 1-2 skin rashes, improved  -Secondary to panitumumab, improved and stable lately  -continue hydrocortisone 2.5%, and clindamycin gel 1%  twice daily as needed  -He knows to avoid sun exposure, and call me if it gets worse.  3. Gastric ulcer with significant GI bleeding in April and Aug 2016 -He is on PPI, continue once daily  -Repeat his EGD on 08/15/2015 showed near complete healing of his gastric ulcer -continue Nadolol 17m bid, per Dr. SFuller Plan- I encourage him to follow-up with Dr. SFuller Plan 4. Type 2 DM  -HbA1c was 8.5, blood glucose not well controlled, bette r lately  -I encourage him to follow-up with his primary care physician Dr. WJimmye Norman 5. HTN, Dilated nonischemic ischemia cardiomyopathy with EF 25% -He is clinically doing well without symptoms of CHF. However this is probably  going to impact his chemotherapy.Will try to avoid cardiotoxic chemotherapy agent and avoid fluid overload during chemotherapy. -Continue follow-up with cardiology.  6 Hepatitis B carrier, with mild portal hypertension  -Per liver clinic, no need for treatment. Follow-up with Dr. SSilvio Pate  7. Malnutrition -I encouraged him to eat more, and take supplements as needed. -follow up with Dietitian   8. Anemia secondary to GI bleeding, iron deficiency and chemo  -Repeat lab on 06/06/2015 showed ferritin 117, serum iron 24, saturation 8%, which supports iron deficiency -He received IV Feraheme again in Aug 2016 after GI bleeding  -Repeat a ferritin was 273 on 10/03/2015, much improved  -he received iv feraheme again on 11/27/2015 and 12/04/2015, but anemia did not improve much. -His anemia has been getting worse lately, probably related to chemotherapy, we'll close monitor any signs of GI bleeding. -Blood transfusion if hemoglobin less than 8  11. hypomagnesemia  -secondary to panitumumab -he receives IV mag 6 g every 1-2 weeks -He is taking magnesium pill 2 tablets 3 times a day, we'll hold it for now due to his diarrhea -follow up weekly   12. Diarrhea -Secondary to chemotherapy. Resolved now. -I encouraged him to use Imodium if he has diarrhea after chemotherapy   Plan -Lab results reviewed, adequate for treatment, we'll proceed chemotherapy today, irinotecan dose reduction to 125 mg/m, continue panitumumab every 2 weeks  -2 units RBC transfusion in 2-3 days -Return in 1 week for lab and magnesium infusion -I'll see him back in 2 weeks   All questions were answered. The patient knows to call the clinic with any problems, questions or concerns.  I spent 25 minutes counseling the patient face to face. The total time spent in the appointment was 30 minutes and more than 50% was on counseling.     FTruitt Merle MD 02/05/2016

## 2016-02-05 NOTE — Patient Instructions (Signed)

## 2016-02-05 NOTE — Addendum Note (Signed)
Addended by: Truitt Merle on: 02/05/2016 09:36 AM   Modules accepted: Orders

## 2016-02-05 NOTE — Patient Instructions (Signed)
Hypomagnesemia Hypomagnesemia is a condition in which the level of magnesium in the blood is low. Magnesium is a mineral that is found in many foods. It is used in many different processes in the body. Hypomagnesemia can affect every organ in the body. It can cause life-threatening problems. CAUSES Causes of hypomagnesemia include: 1. Not getting enough magnesium in your diet. 2. Malnutrition. 3. Problems with absorbing magnesium from the intestines. 4. Dehydration. 5. Alcohol abuse. 6. Vomiting. 7. Severe diarrhea. 8. Some medicines, including medicines that make you urinate more. 9. Certain diseases, such as kidney disease, diabetes, and overactive thyroid. SIGNS AND SYMPTOMS 1. Involuntary shaking or trembling of a body part (tremor). 2. Confusion. 3. Muscle weakness. 4. Sensitivity to light, sound, and touch. 5. Psychiatric issues, such as depression, irritability, or psychosis. 6. Sudden tightening of muscles (muscle spasms). 7. Tingling in the arms and legs. 8. A feeling of fluttering of the heart. These symptoms are more severe if magnesium levels drop suddenly. DIAGNOSIS To make a diagnosis, your health care provider will do a physical exam and order blood and urine tests. TREATMENT Treatment will depend on the cause and the severity of your condition. It may involve:  A magnesium supplement. This can be taken in pill form. It can also be given through an IV tube. This is usually done if the condition is severe.  Changes to your diet. You may be directed to eat foods that have a lot of magnesium, such as green leafy vegetables, peas, beans, and nuts.  Eliminating alcohol from your diet. HOME CARE INSTRUCTIONS  Include foods with magnesium in your diet. Foods that are rich in magnesium include green vegetables, beans, nuts and seeds, and whole grains.  Take medicines only as directed by your health care provider.  Take magnesium supplements if your health care provider  instructs you to do that. Take them as directed.  Have your magnesium levels monitored as directed by your health care provider.  When you are active, drink fluids that contain electrolytes.  Keep all follow-up visits as directed by your health care provider. This is important. SEEK MEDICAL CARE IF:  You get worse instead of better.  Your symptoms return. SEEK IMMEDIATE MEDICAL CARE IF:  Your symptoms are severe.   This information is not intended to replace advice given to you by your health care provider. Make sure you discuss any questions you have with your health care provider.   Document Released: 07/10/2005 Document Revised: 11/04/2014 Document Reviewed: 05/30/2014 Elsevier Interactive Patient Education 2016 [place] Discharge Instructions for Patients Receiving Chemotherapy  Today you received the following chemotherapy agents Vectibix and Camptosar.  To help prevent nausea and vomiting after your treatment, we encourage you to take your nausea medication as prescribed.   If you develop nausea and vomiting that is not controlled by your nausea medication, call the clinic.   BELOW ARE SYMPTOMS THAT SHOULD BE REPORTED IMMEDIATELY:  *FEVER GREATER THAN 100.5 F  *CHILLS WITH OR WITHOUT FEVER  NAUSEA AND VOMITING THAT IS NOT CONTROLLED WITH YOUR NAUSEA MEDICATION  *UNUSUAL SHORTNESS OF BREATH  *UNUSUAL BRUISING OR BLEEDING  TENDERNESS IN MOUTH AND THROAT WITH OR WITHOUT PRESENCE OF ULCERS  *URINARY PROBLEMS  *BOWEL PROBLEMS  UNUSUAL RASH Items with * indicate a potential emergency and should be followed up as soon as possible.  Feel free to call the clinic you have any questions or concerns. The clinic phone number is [phone].  Please show  the [facility] at check-in to the Emergency Department and triage nurse.

## 2016-02-05 NOTE — Telephone Encounter (Signed)
Gave and printed appt sched and avs for April and May....gv barium...emailed MD to add ct scan order

## 2016-02-07 ENCOUNTER — Ambulatory Visit (HOSPITAL_BASED_OUTPATIENT_CLINIC_OR_DEPARTMENT_OTHER): Payer: PPO

## 2016-02-07 VITALS — BP 130/74 | HR 75 | Temp 97.2°F | Resp 18

## 2016-02-07 DIAGNOSIS — K254 Chronic or unspecified gastric ulcer with hemorrhage: Secondary | ICD-10-CM

## 2016-02-07 DIAGNOSIS — D500 Iron deficiency anemia secondary to blood loss (chronic): Secondary | ICD-10-CM

## 2016-02-07 DIAGNOSIS — D630 Anemia in neoplastic disease: Secondary | ICD-10-CM

## 2016-02-07 DIAGNOSIS — D509 Iron deficiency anemia, unspecified: Secondary | ICD-10-CM

## 2016-02-07 DIAGNOSIS — C187 Malignant neoplasm of sigmoid colon: Secondary | ICD-10-CM | POA: Diagnosis not present

## 2016-02-07 LAB — PREPARE RBC (CROSSMATCH)

## 2016-02-07 MED ORDER — FUROSEMIDE 10 MG/ML IJ SOLN
INTRAMUSCULAR | Status: AC
  Filled 2016-02-07: qty 2

## 2016-02-07 MED ORDER — DIPHENHYDRAMINE HCL 25 MG PO CAPS
25.0000 mg | ORAL_CAPSULE | Freq: Once | ORAL | Status: AC
  Administered 2016-02-07: 25 mg via ORAL

## 2016-02-07 MED ORDER — FUROSEMIDE 10 MG/ML IJ SOLN
20.0000 mg | Freq: Once | INTRAMUSCULAR | Status: AC
  Administered 2016-02-07: 20 mg via INTRAVENOUS

## 2016-02-07 MED ORDER — HEPARIN SOD (PORK) LOCK FLUSH 100 UNIT/ML IV SOLN
500.0000 [IU] | Freq: Every day | INTRAVENOUS | Status: AC | PRN
  Administered 2016-02-07: 500 [IU]
  Filled 2016-02-07: qty 5

## 2016-02-07 MED ORDER — SODIUM CHLORIDE 0.9% FLUSH
10.0000 mL | INTRAVENOUS | Status: AC | PRN
  Administered 2016-02-07: 10 mL
  Filled 2016-02-07: qty 10

## 2016-02-07 MED ORDER — DIPHENHYDRAMINE HCL 25 MG PO CAPS
ORAL_CAPSULE | ORAL | Status: AC
  Filled 2016-02-07: qty 1

## 2016-02-07 MED ORDER — ACETAMINOPHEN 325 MG PO TABS
ORAL_TABLET | ORAL | Status: AC
Start: 2016-02-07 — End: 2016-02-07
  Filled 2016-02-07: qty 2

## 2016-02-07 MED ORDER — SODIUM CHLORIDE 0.9 % IV SOLN
250.0000 mL | Freq: Once | INTRAVENOUS | Status: AC
  Administered 2016-02-07: 250 mL via INTRAVENOUS

## 2016-02-07 MED ORDER — ACETAMINOPHEN 325 MG PO TABS
650.0000 mg | ORAL_TABLET | Freq: Once | ORAL | Status: AC
Start: 2016-02-07 — End: 2016-02-07
  Administered 2016-02-07: 650 mg via ORAL

## 2016-02-07 NOTE — Patient Instructions (Signed)

## 2016-02-08 LAB — TYPE AND SCREEN
ABO/RH(D): O POS
Antibody Screen: NEGATIVE
UNIT DIVISION: 0
Unit division: 0

## 2016-02-12 ENCOUNTER — Ambulatory Visit: Payer: PPO

## 2016-02-12 ENCOUNTER — Ambulatory Visit (HOSPITAL_BASED_OUTPATIENT_CLINIC_OR_DEPARTMENT_OTHER): Payer: PPO

## 2016-02-12 ENCOUNTER — Other Ambulatory Visit (HOSPITAL_BASED_OUTPATIENT_CLINIC_OR_DEPARTMENT_OTHER): Payer: PPO

## 2016-02-12 VITALS — BP 110/66 | HR 92 | Temp 97.7°F | Resp 16

## 2016-02-12 DIAGNOSIS — D509 Iron deficiency anemia, unspecified: Secondary | ICD-10-CM

## 2016-02-12 DIAGNOSIS — C187 Malignant neoplasm of sigmoid colon: Secondary | ICD-10-CM | POA: Diagnosis not present

## 2016-02-12 DIAGNOSIS — Z95828 Presence of other vascular implants and grafts: Secondary | ICD-10-CM

## 2016-02-12 LAB — CBC WITH DIFFERENTIAL/PLATELET
BASO%: 0.4 % (ref 0.0–2.0)
Basophils Absolute: 0 10*3/uL (ref 0.0–0.1)
EOS%: 1.5 % (ref 0.0–7.0)
Eosinophils Absolute: 0.1 10*3/uL (ref 0.0–0.5)
HEMATOCRIT: 32.8 % — AB (ref 38.4–49.9)
HEMOGLOBIN: 10.7 g/dL — AB (ref 13.0–17.1)
LYMPH#: 0.6 10*3/uL — AB (ref 0.9–3.3)
LYMPH%: 13.1 % — ABNORMAL LOW (ref 14.0–49.0)
MCH: 29.2 pg (ref 27.2–33.4)
MCHC: 32.6 g/dL (ref 32.0–36.0)
MCV: 89.6 fL (ref 79.3–98.0)
MONO#: 0.3 10*3/uL (ref 0.1–0.9)
MONO%: 6.7 % (ref 0.0–14.0)
NEUT#: 3.7 10*3/uL (ref 1.5–6.5)
NEUT%: 78.3 % — ABNORMAL HIGH (ref 39.0–75.0)
Platelets: 158 10*3/uL (ref 140–400)
RBC: 3.66 10*6/uL — ABNORMAL LOW (ref 4.20–5.82)
RDW: 16.4 % — AB (ref 11.0–14.6)
WBC: 4.7 10*3/uL (ref 4.0–10.3)

## 2016-02-12 LAB — COMPREHENSIVE METABOLIC PANEL
ALBUMIN: 3.2 g/dL — AB (ref 3.5–5.0)
ALK PHOS: 201 U/L — AB (ref 40–150)
ALT: 60 U/L — ABNORMAL HIGH (ref 0–55)
AST: 53 U/L — AB (ref 5–34)
Anion Gap: 9 mEq/L (ref 3–11)
BILIRUBIN TOTAL: 0.77 mg/dL (ref 0.20–1.20)
BUN: 10.4 mg/dL (ref 7.0–26.0)
CO2: 24 mEq/L (ref 22–29)
Calcium: 8.9 mg/dL (ref 8.4–10.4)
Chloride: 106 mEq/L (ref 98–109)
Creatinine: 0.8 mg/dL (ref 0.7–1.3)
EGFR: >90 mL/min/{1.73_m2} (ref >90)
GLUCOSE: 235 mg/dL — AB (ref 70–140)
Potassium: 4.2 mEq/L (ref 3.5–5.1)
SODIUM: 140 meq/L (ref 136–145)
TOTAL PROTEIN: 6.9 g/dL (ref 6.4–8.3)

## 2016-02-12 LAB — MAGNESIUM: Magnesium: 1 mg/dl — CL (ref 1.5–2.5)

## 2016-02-12 MED ORDER — SODIUM CHLORIDE 0.9% FLUSH
10.0000 mL | INTRAVENOUS | Status: DC | PRN
  Administered 2016-02-12: 10 mL via INTRAVENOUS
  Filled 2016-02-12: qty 10

## 2016-02-12 MED ORDER — SODIUM CHLORIDE 0.9 % IV SOLN
Freq: Once | INTRAVENOUS | Status: AC
  Administered 2016-02-12: 09:00:00 via INTRAVENOUS
  Filled 2016-02-12: qty 500

## 2016-02-12 MED ORDER — SODIUM CHLORIDE 0.9 % IJ SOLN
3.0000 mL | Freq: Once | INTRAMUSCULAR | Status: DC | PRN
  Filled 2016-02-12: qty 10

## 2016-02-12 MED ORDER — HEPARIN SOD (PORK) LOCK FLUSH 100 UNIT/ML IV SOLN
500.0000 [IU] | Freq: Once | INTRAVENOUS | Status: AC
  Administered 2016-02-12: 500 [IU] via INTRAVENOUS
  Filled 2016-02-12: qty 5

## 2016-02-12 NOTE — Patient Instructions (Signed)
Hypomagnesemia °Hypomagnesemia is a condition in which the level of magnesium in the blood is low. Magnesium is a mineral that is found in many foods. It is used in many different processes in the body. Hypomagnesemia can affect every organ in the body. It can cause life-threatening problems. °CAUSES °Causes of hypomagnesemia include: °· Not getting enough magnesium in your diet. °· Malnutrition. °· Problems with absorbing magnesium from the intestines. °· Dehydration. °· Alcohol abuse. °· Vomiting. °· Severe diarrhea. °· Some medicines, including medicines that make you urinate more. °· Certain diseases, such as kidney disease, diabetes, and overactive thyroid. °SIGNS AND SYMPTOMS °· Involuntary shaking or trembling of a body part (tremor). °· Confusion. °· Muscle weakness. °· Sensitivity to light, sound, and touch. °· Psychiatric issues, such as depression, irritability, or psychosis. °· Sudden tightening of muscles (muscle spasms). °· Tingling in the arms and legs. °· A feeling of fluttering of the heart. °These symptoms are more severe if magnesium levels drop suddenly. °DIAGNOSIS °To make a diagnosis, your health care provider will do a physical exam and order blood and urine tests. °TREATMENT °Treatment will depend on the cause and the severity of your condition. It may involve: °· A magnesium supplement. This can be taken in pill form. It can also be given through an IV tube. This is usually done if the condition is severe. °· Changes to your diet. You may be directed to eat foods that have a lot of magnesium, such as green leafy vegetables, peas, beans, and nuts. °· Eliminating alcohol from your diet. °HOME CARE INSTRUCTIONS °· Include foods with magnesium in your diet. Foods that are rich in magnesium include green vegetables, beans, nuts and seeds, and whole grains. °· Take medicines only as directed by your health care provider. °· Take magnesium supplements if your health care provider instructs you to  do that. Take them as directed. °· Have your magnesium levels monitored as directed by your health care provider. °· When you are active, drink fluids that contain electrolytes. °· Keep all follow-up visits as directed by your health care provider. This is important. °SEEK MEDICAL CARE IF: °· You get worse instead of better. °· Your symptoms return. °SEEK IMMEDIATE MEDICAL CARE IF: °· Your symptoms are severe. °  °This information is not intended to replace advice given to you by your health care provider. Make sure you discuss any questions you have with your health care provider. °  °Document Released: 07/10/2005 Document Revised: 11/04/2014 Document Reviewed: 05/30/2014 °Elsevier Interactive Patient Education ©2016 [employer] ° °

## 2016-02-12 NOTE — Patient Instructions (Signed)

## 2016-02-19 ENCOUNTER — Other Ambulatory Visit: Payer: PPO

## 2016-02-19 ENCOUNTER — Encounter: Payer: Self-pay | Admitting: Hematology

## 2016-02-19 ENCOUNTER — Ambulatory Visit (HOSPITAL_BASED_OUTPATIENT_CLINIC_OR_DEPARTMENT_OTHER): Payer: PPO

## 2016-02-19 ENCOUNTER — Other Ambulatory Visit: Payer: Self-pay | Admitting: *Deleted

## 2016-02-19 ENCOUNTER — Other Ambulatory Visit (HOSPITAL_BASED_OUTPATIENT_CLINIC_OR_DEPARTMENT_OTHER): Payer: PPO

## 2016-02-19 ENCOUNTER — Ambulatory Visit (HOSPITAL_BASED_OUTPATIENT_CLINIC_OR_DEPARTMENT_OTHER): Payer: PPO | Admitting: Hematology

## 2016-02-19 ENCOUNTER — Ambulatory Visit: Payer: PPO

## 2016-02-19 VITALS — BP 135/72 | HR 84 | Temp 98.4°F | Resp 18 | Ht 65.0 in | Wt 141.4 lb

## 2016-02-19 DIAGNOSIS — C7800 Secondary malignant neoplasm of unspecified lung: Secondary | ICD-10-CM

## 2016-02-19 DIAGNOSIS — C787 Secondary malignant neoplasm of liver and intrahepatic bile duct: Secondary | ICD-10-CM | POA: Diagnosis not present

## 2016-02-19 DIAGNOSIS — C187 Malignant neoplasm of sigmoid colon: Secondary | ICD-10-CM

## 2016-02-19 DIAGNOSIS — D509 Iron deficiency anemia, unspecified: Secondary | ICD-10-CM

## 2016-02-19 DIAGNOSIS — Z5111 Encounter for antineoplastic chemotherapy: Secondary | ICD-10-CM | POA: Diagnosis not present

## 2016-02-19 DIAGNOSIS — C772 Secondary and unspecified malignant neoplasm of intra-abdominal lymph nodes: Secondary | ICD-10-CM | POA: Diagnosis not present

## 2016-02-19 DIAGNOSIS — E46 Unspecified protein-calorie malnutrition: Secondary | ICD-10-CM

## 2016-02-19 DIAGNOSIS — C7972 Secondary malignant neoplasm of left adrenal gland: Secondary | ICD-10-CM

## 2016-02-19 DIAGNOSIS — I10 Essential (primary) hypertension: Secondary | ICD-10-CM

## 2016-02-19 DIAGNOSIS — Z5112 Encounter for antineoplastic immunotherapy: Secondary | ICD-10-CM | POA: Diagnosis not present

## 2016-02-19 DIAGNOSIS — I5042 Chronic combined systolic (congestive) and diastolic (congestive) heart failure: Secondary | ICD-10-CM

## 2016-02-19 DIAGNOSIS — D500 Iron deficiency anemia secondary to blood loss (chronic): Secondary | ICD-10-CM

## 2016-02-19 DIAGNOSIS — D6481 Anemia due to antineoplastic chemotherapy: Secondary | ICD-10-CM

## 2016-02-19 DIAGNOSIS — C189 Malignant neoplasm of colon, unspecified: Secondary | ICD-10-CM

## 2016-02-19 DIAGNOSIS — R197 Diarrhea, unspecified: Secondary | ICD-10-CM

## 2016-02-19 DIAGNOSIS — Z95828 Presence of other vascular implants and grafts: Secondary | ICD-10-CM | POA: Insufficient documentation

## 2016-02-19 DIAGNOSIS — E119 Type 2 diabetes mellitus without complications: Secondary | ICD-10-CM

## 2016-02-19 LAB — COMPREHENSIVE METABOLIC PANEL
ALBUMIN: 3.3 g/dL — AB (ref 3.5–5.0)
ALT: 29 U/L (ref 0–55)
AST: 30 U/L (ref 5–34)
Alkaline Phosphatase: 160 U/L — ABNORMAL HIGH (ref 40–150)
Anion Gap: 8 mEq/L (ref 3–11)
BILIRUBIN TOTAL: 0.75 mg/dL (ref 0.20–1.20)
BUN: 7.9 mg/dL (ref 7.0–26.0)
CALCIUM: 8.8 mg/dL (ref 8.4–10.4)
CHLORIDE: 111 meq/L — AB (ref 98–109)
CO2: 24 mEq/L (ref 22–29)
CREATININE: 0.8 mg/dL (ref 0.7–1.3)
EGFR: >90 mL/min/{1.73_m2} (ref >90)
Glucose: 127 mg/dl (ref 70–140)
Potassium: 4.1 mEq/L (ref 3.5–5.1)
Sodium: 144 mEq/L (ref 136–145)
TOTAL PROTEIN: 7 g/dL (ref 6.4–8.3)

## 2016-02-19 LAB — CBC WITH DIFFERENTIAL/PLATELET
BASO%: 0.4 % (ref 0.0–2.0)
Basophils Absolute: 0 10*3/uL (ref 0.0–0.1)
EOS ABS: 0.1 10*3/uL (ref 0.0–0.5)
EOS%: 2.7 % (ref 0.0–7.0)
HEMATOCRIT: 32.1 % — AB (ref 38.4–49.9)
HEMOGLOBIN: 10.5 g/dL — AB (ref 13.0–17.1)
LYMPH#: 0.5 10*3/uL — AB (ref 0.9–3.3)
LYMPH%: 14.3 % (ref 14.0–49.0)
MCH: 29.2 pg (ref 27.2–33.4)
MCHC: 32.6 g/dL (ref 32.0–36.0)
MCV: 89.4 fL (ref 79.3–98.0)
MONO#: 0.4 10*3/uL (ref 0.1–0.9)
MONO%: 12 % (ref 0.0–14.0)
NEUT%: 70.6 % (ref 39.0–75.0)
NEUTROS ABS: 2.2 10*3/uL (ref 1.5–6.5)
PLATELETS: 150 10*3/uL (ref 140–400)
RBC: 3.59 10*6/uL — AB (ref 4.20–5.82)
RDW: 18 % — ABNORMAL HIGH (ref 11.0–14.6)
WBC: 3.2 10*3/uL — AB (ref 4.0–10.3)

## 2016-02-19 LAB — MAGNESIUM: Magnesium: 0.8 mg/dl — CL (ref 1.5–2.5)

## 2016-02-19 MED ORDER — SODIUM CHLORIDE 0.9 % IV SOLN
Freq: Once | INTRAVENOUS | Status: AC
  Administered 2016-02-19: 13:00:00 via INTRAVENOUS
  Filled 2016-02-19: qty 250

## 2016-02-19 MED ORDER — SODIUM CHLORIDE 0.9 % IV SOLN
Freq: Once | INTRAVENOUS | Status: AC
  Administered 2016-02-19: 10:00:00 via INTRAVENOUS
  Filled 2016-02-19: qty 4

## 2016-02-19 MED ORDER — DEXTROSE 5 % IV SOLN
118.0000 mg/m2 | Freq: Once | INTRAVENOUS | Status: AC
  Administered 2016-02-19: 200 mg via INTRAVENOUS
  Filled 2016-02-19: qty 10

## 2016-02-19 MED ORDER — PROCHLORPERAZINE MALEATE 10 MG PO TABS: 10.0000 mg | ORAL_TABLET | Freq: Four times a day (QID) | ORAL | Status: DC | PRN

## 2016-02-19 MED ORDER — HEPARIN SOD (PORK) LOCK FLUSH 100 UNIT/ML IV SOLN
500.0000 [IU] | Freq: Once | INTRAVENOUS | Status: AC | PRN
  Administered 2016-02-19: 500 [IU]
  Filled 2016-02-19: qty 5

## 2016-02-19 MED ORDER — NADOLOL 20 MG PO TABS: 20.0000 mg | ORAL_TABLET | Freq: Two times a day (BID) | ORAL | Status: DC

## 2016-02-19 MED ORDER — MAGNESIUM OXIDE 400 (241.3 MG) MG PO TABS: 400.0000 mg | ORAL_TABLET | Freq: Three times a day (TID) | ORAL | Status: DC

## 2016-02-19 MED ORDER — SODIUM CHLORIDE 0.9 % IJ SOLN
10.0000 mL | INTRAMUSCULAR | Status: DC | PRN
  Administered 2016-02-19: 10 mL
  Filled 2016-02-19: qty 10

## 2016-02-19 MED ORDER — ATROPINE SULFATE 1 MG/ML IJ SOLN
INTRAMUSCULAR | Status: AC
  Filled 2016-02-19: qty 1

## 2016-02-19 MED ORDER — SODIUM CHLORIDE 0.9 % IV SOLN
6.2000 mg/kg | Freq: Once | INTRAVENOUS | Status: AC
  Administered 2016-02-19: 400 mg via INTRAVENOUS
  Filled 2016-02-19: qty 20

## 2016-02-19 MED ORDER — DIPHENOXYLATE-ATROPINE 2.5-0.025 MG PO TABS: 1.0000 | ORAL_TABLET | Freq: Four times a day (QID) | ORAL | Status: DC | PRN

## 2016-02-19 MED ORDER — SODIUM CHLORIDE 0.9 % IV SOLN
Freq: Once | INTRAVENOUS | Status: AC
  Administered 2016-02-19: 10:00:00 via INTRAVENOUS

## 2016-02-19 MED ORDER — SODIUM CHLORIDE 0.9 % IJ SOLN
10.0000 mL | INTRAMUSCULAR | Status: DC | PRN
  Administered 2016-02-19: 10 mL via INTRAVENOUS
  Filled 2016-02-19: qty 10

## 2016-02-19 MED ORDER — ATROPINE SULFATE 1 MG/ML IJ SOLN
0.5000 mg | Freq: Once | INTRAMUSCULAR | Status: AC | PRN
  Administered 2016-02-19: 0.5 mg via INTRAVENOUS

## 2016-02-19 NOTE — Progress Notes (Signed)
Spoke with [name] regarding Magnesium of 0.8 today. She gave ok to treat for Vectibix. We will also give him Magnesium today.  Ma Hillock, RN

## 2016-02-19 NOTE — Patient Instructions (Addendum)
[facility] Discharge Instructions for Patients Receiving Chemotherapy  Today you received the following chemotherapy agents: Vectibix & Irinotican.  To help prevent nausea and vomiting after your treatment, we encourage you to take your nausea medication as prescribed.   If you develop nausea and vomiting that is not controlled by your nausea medication, call the clinic.   BELOW ARE SYMPTOMS THAT SHOULD BE REPORTED IMMEDIATELY:  *FEVER GREATER THAN 100.5 F  *CHILLS WITH OR WITHOUT FEVER  NAUSEA AND VOMITING THAT IS NOT CONTROLLED WITH YOUR NAUSEA MEDICATION  *UNUSUAL SHORTNESS OF BREATH  *UNUSUAL BRUISING OR BLEEDING  TENDERNESS IN MOUTH AND THROAT WITH OR WITHOUT PRESENCE OF ULCERS  *URINARY PROBLEMS  *BOWEL PROBLEMS  UNUSUAL RASH Items with * indicate a potential emergency and should be followed up as soon as possible.  Feel free to call the clinic you have any questions or concerns. The clinic phone number is [phone].  Please show the [facility] at check-in to the Emergency Department and triage nurse.  Hypomagnesemia Hypomagnesemia is a condition in which the level of magnesium in the blood is low. Magnesium is a mineral that is found in many foods. It is used in many different processes in the body. Hypomagnesemia can affect every organ in the body. It can cause life-threatening problems. CAUSES Causes of hypomagnesemia include:  Not getting enough magnesium in your diet.  Malnutrition.  Problems with absorbing magnesium from the intestines.  Dehydration.  Alcohol abuse.  Vomiting.  Severe diarrhea.  Some medicines, including medicines that make you urinate more.  Certain diseases, such as kidney disease, diabetes, and overactive thyroid. SIGNS AND SYMPTOMS  Involuntary shaking or trembling of a body part (tremor).  Confusion.  Muscle weakness.  Sensitivity to light, sound, and touch.  Psychiatric issues, such as  depression, irritability, or psychosis.  Sudden tightening of muscles (muscle spasms).  Tingling in the arms and legs.  A feeling of fluttering of the heart. These symptoms are more severe if magnesium levels drop suddenly. DIAGNOSIS To make a diagnosis, your health care provider will do a physical exam and order blood and urine tests. TREATMENT Treatment will depend on the cause and the severity of your condition. It may involve:  A magnesium supplement. This can be taken in pill form. It can also be given through an IV tube. This is usually done if the condition is severe.  Changes to your diet. You may be directed to eat foods that have a lot of magnesium, such as green leafy vegetables, peas, beans, and nuts.  Eliminating alcohol from your diet. HOME CARE INSTRUCTIONS  Include foods with magnesium in your diet. Foods that are rich in magnesium include green vegetables, beans, nuts and seeds, and whole grains.  Take medicines only as directed by your health care provider.  Take magnesium supplements if your health care provider instructs you to do that. Take them as directed.  Have your magnesium levels monitored as directed by your health care provider.  When you are active, drink fluids that contain electrolytes.  Keep all follow-up visits as directed by your health care provider. This is important. SEEK MEDICAL CARE IF:  You get worse instead of better.  Your symptoms return. SEEK IMMEDIATE MEDICAL CARE IF:  Your symptoms are severe.   This information is not intended to replace advice given to you by your health care provider. Make sure you discuss any questions you have with your health care provider.   Document Released:  07/10/2005 Document Revised: 11/04/2014 Document Reviewed: 05/30/2014 Elsevier Interactive Patient Education [employer].

## 2016-02-19 NOTE — Progress Notes (Signed)
[facility]  Telephone:([phone]) [phone] Fax:([phone]) [phone]  Clinic follow up Note   Patient Care Team: [name], MD as PCP - General (Specialist) [name], CRNP as Nurse Practitioner (Nurse Practitioner) Truitt Merle, MD as Consulting Physician (Hematology) Ladene Artist, MD as Consulting Physician (Gastroenterology) 02/19/2016  CHIEF COMPLAINTS Follow up metastatic sigmoid colon cancer  Oncology History   He is a Metastatic colon cancer to liver   Staging form: Colon and Rectum, AJCC 7th Edition     Clinical: Stage Unknown ([facility], NX, M1) - Unsigned      Cancer of sigmoid colon ([facility])   10/28/2014 Tumor Marker AFP 3.2 CEA > 10,000 CA 19-9 12,929.6. tumor (-) KRAS and NRAS mutation.    11/10/2014 Imaging PET scan showed hypermetabolic mass in the sigmoid colon was noted metastasis in the retroperitoneum. Probable left adrenal and pulmonary metastasis, and diffuse liver metastasis.   11/21/2014 Initial Diagnosis Metastatic colon cancer to liver, lung, abd nodes and left adrenal gland. Diagnosis was made by liver biopsy.    11/30/2014 -  Chemotherapy First line chemo [id number], Panitumumab added from second cycle    12/28/2014 - 12/31/2014 Hospital Admission Was admitted for dehydration, neutropenia fever with UTI, and severe skin rash.   02/08/2015 - 02/12/2015 Hospital Admission He was admitted for upper GI bleeding, e.g. showed a gastric ulcer with clots, status post appendectomy injection. He also received a blood transfusion.   03/01/2015 Tumor Marker CEA 694, CA19.9 462   03/13/2015 Imaging CT CAP showed partial response, no new lesions.    03/15/2015 - 05/23/2015 Chemotherapy restart FOLFOX, held again on 8/9 due to GI bleeding    05/31/2015 - 06/02/2015 Hospital Admission He was admitted to [facility] in [place] due to upper GI bleeding, EGD showed gastric and dudenal ulcers    06/27/2015 - 06/29/2015 Hospital Admission he was admitted for dairrhea and pancolitis, c-diff and  stool cultures were negative, treated with antibiotics   07/12/2015 -  Chemotherapy panitumumab 55m/kg, every 2 weeks   11/08/2015 - 11/10/2015 Hospital Admission Pt was admitted for sepsis and hypotension, was found to have (+) influenza A and treated.    11/30/2015 Imaging  CT scans reviewed continued improvement in the liver metastasis, no other new lesions.     HISTORY OF PRESENTING ILLNESS:  PAzionAdup 70y.o. male is here because of abnormal CT findings, which is very suspicious for malignancy. [place] He came in with his son and an interpreter.  He has been feeling fatigued since two month ago. He is still able to do all ADLs. He otherwise denies any pain, bloating or nausea.  He lost about 20lbs in 3 month. His appetite is lower than before, eats less, no change of his bowl habits.  She denied any hematochezia or melana. Per his son, he has had some personality changes daily, irritable, slightly confused some time.  He was evaluated by his primary care physician. Lab test reviewed hepatitis B infection, which he did not know before, and elevated alkaline phosphatase, his liver function and the rest of the liver function was not remarkable. [place] was obtained on 07/22/2014, which showed diffusely abnormal liver with multiple echogenic lesions. CT of abdomen with and without contrast was done on 08/26/2014, which reviewed here at a medically with multiple large partially calcified hepatic masses consistent with metastatic disease. Mild retroperitoneal adenopathy with the largest node measuring 1.6  cm. And nonspecific 1.4 cm left adrenal nodule was also noticed. His tumor marker showed CEA greater than 10,000, CA 19-9 12,929, AFP 3.2 (normal). He was referred to [facility] liver clinic and was evaluated by nurse practitioner [name]. Treatment for hepatitis B was not recommended based on his virus load.  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 He was evaluated by a cardiologist in 2014. He denies any significant dyspnea on exertion. No leg swollen.  CURRENT THERAPY: panitumumab 40m/kg, every 2 weeks, started on 07/12/2015, Irinotecan 1816mm2 every 2 weeks added on 09/19/2015, dose decreased to 16017m2 from cycle 2, and further decreased to 125m58m from cycle 10 due to diarrhea and anorexia.  He also receives IV magnesium 6g weekly   INTERIM HISTORY: PaulAdriaanurns for follow-up and chemotherapy. He is accompanied by his son and interpreter to the clinic today. He tolerated the last cycle chemotherapy much better, no significant nausea, diarrhea, his appetite has been moderate, po intake has been fair, and he gained 2 pounds in the past 2 weeks. He complains of runny nose, second in the allergy daily, and could not sleep well at night. No other new complaints. His energy level has improved since his blood transfusion 2 weeks ago  MEDICAL HISTORY:  Past Medical History  Diagnosis Date  . Tachycardia   . Abnormal EKG   . Hypertension   . Non-ischemic cardiomyopathy (HCC)[place]. Hepatitis B   . H. pylori infection   . Gastric ulcer   . Metastatic colon cancer to liver (HCC)[place]. Diabetes mellitus without complication (HCC)[place]  metformin    SURGICAL HISTORY: Past Surgical History  Procedure Laterality Date  . Nuclear stress test  03/03/2013    High risk - consistent with nonischemic cardiomyopathy  . Left and right heart catheterization with coronary angiogram N/A 03/29/2013    Procedure: LEFT AND RIGHT HEART CATHETERIZATION WITH CORONARY ANGIOGRAM;  Surgeon: KennPixie Casino;  [facility];  Service: Cardiovascular;  Laterality: N/A;  . Esophagogastroduodenoscopy N/A 02/08/2015    Procedure: ESOPHAGOGASTRODUODENOSCOPY (EGD);  Surgeon: MalcLadene Artist;  [facility];  Service: Endoscopy;  Laterality: N/A;  . Esophagogastroduodenoscopy  (egd) with propofol N/A 08/15/2015    Procedure: ESOPHAGOGASTRODUODENOSCOPY (EGD) WITH PROPOFOL;  Surgeon: MalcLadene Artist;  Location: [facility];  Service: Endoscopy;  Laterality: N/A;    SOCIAL HISTORY: Social History   Social History  . Marital Status: Married    Spouse Name: N/A  . Number of Children: N/A  . Years of Education: N/A   Occupational History  . Not on file.   Social History Main Topics  . Smoking status: Former Smoker    Types: Cigarettes    Quit date: 11/07/2008  . Smokeless tobacco: Never Used  . Alcohol Use: Yes     Comment: occasional  . Drug Use: No  . Sexual Activity: No   Other Topics Concern  . Not on file   Social History Narrative   Married   Enjoys walking   Has lived in [place]    FAMILY HISTORY: No family history of liver disease or malignancy.  ALLERGIES:  has No Known Allergies.  MEDICATIONS:  Current Outpatient Prescriptions on File Prior to Visit  Medication Sig Dispense Refill  . clindamycin (CLINDAGEL) 1 % gel Apply topically 2 (two) times daily. 30 g 2  . folic acid (FOLVITE) 1 MG tablet  Take 1 tablet (1 mg total) by mouth daily. 30 tablet 3  . hydrocortisone 1 % ointment Apply 1 application topically 2 (two) times daily. 56 g 0  . lidocaine-prilocaine (EMLA) cream Apply 1 application topically as needed. Apply to [facility] cath at least one hour before needle stick. 30 g 2  . lisinopril-hydrochlorothiazide (PRINZIDE,ZESTORETIC) 10-12.5 MG tablet     . loperamide (IMODIUM) 2 MG capsule Take 2 capsules (4 mg total) by mouth 4 (four) times daily as needed for diarrhea or loose stools (NO MORE THAN 8 TABLETS PER DAY.). 60 capsule 3  . magic mouthwash SOLN Take 5 mLs by mouth 4 (four) times daily. 120 mL 1  . metFORMIN (GLUCOPHAGE) 500 MG tablet Take 1 tablet (500 mg total) by mouth 2 (two) times daily with a meal. THIS IS A ONE TIME ORDER.  FUTURE REFILLS NEED TO BE DONE BY PRIMARY MD. 90 tablet 0  . ondansetron (ZOFRAN) 8  MG tablet Take 1 tablet (8 mg total) by mouth every 8 (eight) hours as needed for nausea or vomiting. 45 tablet 2  . pantoprazole (PROTONIX) 40 MG tablet Take 1 tablet (40 mg total) by mouth daily. 30 tablet 2   Current Facility-Administered Medications on File Prior to Visit  Medication Dose Route Frequency Provider Last Rate Last Dose  . 0.9 %  sodium chloride infusion   Intravenous Once Truitt Merle, MD      . sodium chloride 0.9 % injection 10 mL  10 mL Intracatheter PRN Truitt Merle, MD   10 mL at 10/03/15 1705  . sodium chloride 0.9 % injection 10 mL  10 mL Intravenous PRN Truitt Merle, MD   10 mL at 02/19/16 [phone]  ;   REVIEW OF SYSTEMS:   Constitutional: Denies fevers, chills or abnormal night sweats, (+) fatigue  Eyes: Denies blurriness of vision, double vision or watery eyes Ears, nose, mouth, throat, and face: Denies mucositis or sore throat Respiratory: Denies cough, dyspnea or wheezes Cardiovascular: Denies palpitation, chest discomfort or lower extremity swelling Gastrointestinal: Denies nausea, heartburn or change in bowel habits Skin: Denies abnormal skin rashes Lymphatics: Denies new lymphadenopathy or easy bruising Neurological:Denies numbness, tingling or new weaknesses Behavioral/Psych: Mood is stable, no new changes, (+) insomnia All other systems were reviewed with the patient and are negative.  PHYSICAL EXAMINATION: BP 135/72 mmHg  Pulse 84  Temp(Src) 98.4 F (36.9 C) (Oral)  Resp 18  Ht '5\' 5"'  (1.651 m)  Wt 141 lb 6.4 oz (64.139 kg)  BMI 23.53 kg/m2  SpO2 100%  ECOG PERFORMANCE STATUS: 1 Vital sign were taken at in the infusion room, within normal limits. GENERAL:alert, no distress and comfortable SKIN: (+) Dry skin,  no skin ulcer, (+) scatter skin rashes on his neck and upper chest, some are resolving with skin pigmentation. No discharge. EYES: normal, conjunctiva are pink and non-injected, sclera clear OROPHARYNX:no exudate, no erythema and lips, buccal mucosa, and  tongue with mild discoloration at the tip.  NECK: supple, thyroid normal size, non-tender, without nodularity LYMPH:  no palpable lymphadenopathy in the cervical, axillary or inguinal LUNGS: clear to auscultation and percussion with normal breathing effort, no rales   HEART: regular rate & rhythm and no murmurs and no lower extremity edema ABDOMEN:abdomen soft, non-tender, no hepatomegaly, no splenomegaly and normal bowel sounds Musculoskeletal:no cyanosis of digits and no clubbing  PSYCH: alert & oriented x 3 with fluent speech NEURO: no focal motor/sensory deficits  LABORATORY DATA:  I have reviewed the data as  listed CBC Latest Ref Rng 02/19/2016 02/12/2016 02/05/2016  WBC 4.0 - 10.3 10e3/uL 3.2(L) 4.7 2.7(L)  Hemoglobin 13.0 - 17.1 g/dL 10.5(L) 10.7(L) 8.0(L)  Hematocrit 38.4 - 49.9 % 32.1(L) 32.8(L) 23.9(L)  Platelets 140 - 400 10e3/uL 150 158 218   ANC is 3.4  CMP Latest Ref Rng 02/12/2016 01/31/2016 01/22/2016  Glucose 70 - 140 mg/dl 235(H) 125 174(H)  BUN 7.0 - 26.0 mg/dL 10.4 13.2 10.8  Creatinine 0.7 - 1.3 mg/dL 0.8 0.9 0.9  Sodium 136 - 145 mEq/L 140 132(L) 138  Potassium 3.5 - 5.1 mEq/L 4.2 4.1 4.2  CO2 22 - 29 mEq/L 24 21(L) 23  Calcium 8.4 - 10.4 mg/dL 8.9 9.0 8.2(L)  Total Protein 6.4 - 8.3 g/dL 6.9 7.4 7.0  Total Bilirubin 0.20 - 1.20 mg/dL 0.77 1.04 0.64  Alkaline Phos 40 - 150 U/L 201(H) 185(H) 167(H)  AST 5 - 34 U/L 53(H) 18 38(H)  ALT 0 - 55 U/L 60(H) 22 34   Mag pending  ANC 2.2 today   INITIAL tumor markers AFP 3.2 CEA > 10,000 CA 19-9 12,929.6  Results for CORIE, VAVRA (MRN [id number]) as of 02/05/2016 09:18  Ref. Range 10/03/2015 08:49 10/31/2015 08:01 01/08/2016 09:53  CA 19-9 Latest Ref Range: <35.0 U/mL 62.3 (H) 40.7 (H)   CEA Latest Units: ng/mL 66.4 (H) 50.6 (H) 80.3 (H)  CEA Latest Ref Range: 0.0-4.7 ng/mL   110.6 (H)    Pathology report  Liver, needle/core biopsy - METASTATIC ADENOCARCINOMA, SEE COMMENT. Microscopic Comment The adenocarcinoma  demonstrates the following immunophenotype: Cytokeratin 7 - negative expression. Cytokeratin 20 - strong diffuse expression. CD2 - strong diffuse expression. Overall the morphology and immunophenotype are that of metastatic adenocarcinoma primary to colorectum. The recent nuclear medicine scan demonstrating sigmoid mass with associated liver masses is noted.  [facility] test result:    RADIOGRAPHIC STUDIES: I have personally reviewed the outside CT scan image with patient and his son.   CT chest, abdomen and pelvis with IV contrast oN  11/30/2015 IMPRESSION: Mild residual wall thickening involving the distal descending/ proximal sigmoid colon, possibly reflecting a combination residual tumor and/or inflammatory changes.  Improving hepatic metastases, with index lesions as above. Right adrenal metastases, unchanged. Mild abdominopelvic nodal metastases, grossly unchanged.  No evidence of metastatic disease in the chest.  ASSESSMENT & PLAN:  70 year old [place] male, with past history of hypertension and dilated nonischemic gammopathy with EF 25%, no clinical signs of heart failure, who was found to have hepatitis B infection lately, and multiple liver lesions on the CT scan. He has extremely high CEA and CA 19-9 levels. PET scan reviewed a hypermetabolic sigmoid colon mass, diffuse liver metastasis, probable lung and adrenal gland metastasis.  1. Metastatic sigmoid colon cancer, with diffuse liver, lungs, node and left adrenal gland metastases. KRAS/NRAS wild type, MSI-stable -Liver biopsy showed metastatic adenocarcinoma. His tumor were strongly positive for CK20 and CD2, consistent with primary colorectal primary. KRAS and NRAS mutations were not detected.  -Pt understands that this is incurable cancer, and he has very high disease burden and overall prognosis is poor. The treatment goal is palliative -He had excellent response to first-line FOLFOX, but treatment was complicated  with GI bleeding and neutropenic fever, resolved now. -he is currently on second line chemotherapy with irritecan and panitumumab, tolerating well  - I reviewed his restaging CT scan from 11/30/2015, which showed a continuous responding in liver metastasis, no other new lesions. -He is clinical doing well, lab reviewed, adequate for treatment,  we'll continue chemotherapy today.  -Restaging CTs later next week  2. Grade 1-2 skin rashes, improved  -Secondary to panitumumab, improved and stable lately  -continue hydrocortisone 2.5%, and clindamycin gel 1%  twice daily as needed  -He knows to avoid sun exposure, and call me if it gets worse.  3. Gastric ulcer with significant GI bleeding in April and Aug 2016 -He is on PPI, continue once daily  -Repeat his EGD on 08/15/2015 showed near complete healing of his gastric ulcer -continue Nadolol 39m bid, per Dr. SFuller Plan- I encourage him to follow-up with Dr. SFuller Plan 4. Type 2 DM  -HbA1c was 8.5, blood glucose not well controlled, he run out of metformin  -I encourage him to follow-up with his primary care physician Dr. WJimmye Norman and call his office for metformin refill today   5. HTN, Dilated nonischemic ischemia cardiomyopathy with EF 25% -He is clinically doing well without symptoms of CHF. However this is probably going to impact his chemotherapy.Will try to avoid cardiotoxic chemotherapy agent and avoid fluid overload during chemotherapy. -Continue follow-up with cardiology. -will hold on his lisinopril-HCTZ for now, his BP has been normal lately   6 Hepatitis B carrier, with mild portal hypertension  -Per liver clinic, no need for treatment. Follow-up with Dr. SSilvio Pate  7. Malnutrition -I encouraged him to eat more, and take supplements as needed. -follow up with Dietitian   8. Anemia secondary to GI bleeding, iron deficiency and chemo  -Repeat lab on 06/06/2015 showed ferritin 117, serum iron 24, saturation 8%, which supports iron  deficiency -He received IV Feraheme again in Aug 2016 after GI bleeding  -Repeat a ferritin was 273 on 10/03/2015, much improved  -he received iv feraheme again on 11/27/2015 and 12/04/2015, but anemia did not improve much. -His anemia has been getting worse lately, probably related to chemotherapy, we'll close monitor any signs of GI bleeding. -Blood transfusion if hemoglobin less than 8  11. hypomagnesemia  -secondary to panitumumab -he receives IV mag 6 g every 1-2 weeks -He is taking magnesium pill 2 tablets 3 times a day, we'll hold it for now due to his diarrhea -follow up weekly   12. Diarrhea -Secondary to chemotherapy. Resolved now. -I encouraged him to use Imodium and lomotil if he has diarrhea after chemotherapy   Plan -Lab results reviewed, adequate for treatment, we'll proceed chemotherapy today, continue irinotecan dose at 125 mg/m, continue panitumumab every 2 weeks  -Return in 1 week for lab and magnesium infusion -I'll see him back in 2 weeks, restaging CT on 5/4  -I refilled his meds today   All questions were answered. The patient knows to call the clinic with any problems, questions or concerns.  I spent 25 minutes counseling the patient face to face. The total time spent in the appointment was 30 minutes and more than 50% was on counseling.     FTruitt Merle MD 02/19/2016

## 2016-02-19 NOTE — Patient Instructions (Signed)

## 2016-02-20 LAB — CEA (PARALLEL TESTING): CEA: 134.6 ng/mL — ABNORMAL HIGH

## 2016-02-20 LAB — CEA: CEA1: 155.8 ng/mL — AB (ref 0.0–4.7)

## 2016-02-26 ENCOUNTER — Ambulatory Visit (HOSPITAL_BASED_OUTPATIENT_CLINIC_OR_DEPARTMENT_OTHER): Payer: PPO

## 2016-02-26 ENCOUNTER — Ambulatory Visit: Payer: PPO

## 2016-02-26 ENCOUNTER — Other Ambulatory Visit (HOSPITAL_BASED_OUTPATIENT_CLINIC_OR_DEPARTMENT_OTHER): Payer: PPO

## 2016-02-26 VITALS — BP 104/59 | HR 92 | Temp 97.0°F | Resp 18

## 2016-02-26 DIAGNOSIS — Z95828 Presence of other vascular implants and grafts: Secondary | ICD-10-CM

## 2016-02-26 DIAGNOSIS — D509 Iron deficiency anemia, unspecified: Secondary | ICD-10-CM

## 2016-02-26 DIAGNOSIS — D500 Iron deficiency anemia secondary to blood loss (chronic): Secondary | ICD-10-CM

## 2016-02-26 DIAGNOSIS — C187 Malignant neoplasm of sigmoid colon: Secondary | ICD-10-CM

## 2016-02-26 LAB — CBC WITH DIFFERENTIAL/PLATELET
BASO%: 0.3 % (ref 0.0–2.0)
Basophils Absolute: 0 10*3/uL (ref 0.0–0.1)
EOS ABS: 0.2 10*3/uL (ref 0.0–0.5)
EOS%: 6.2 % (ref 0.0–7.0)
HCT: 29.6 % — ABNORMAL LOW (ref 38.4–49.9)
HGB: 9.9 g/dL — ABNORMAL LOW (ref 13.0–17.1)
LYMPH%: 21.1 % (ref 14.0–49.0)
MCH: 29.4 pg (ref 27.2–33.4)
MCHC: 33.4 g/dL (ref 32.0–36.0)
MCV: 87.8 fL (ref 79.3–98.0)
MONO#: 0.2 10*3/uL (ref 0.1–0.9)
MONO%: 7.8 % (ref 0.0–14.0)
NEUT%: 64.6 % (ref 39.0–75.0)
NEUTROS ABS: 2 10*3/uL (ref 1.5–6.5)
Platelets: 111 10*3/uL — ABNORMAL LOW (ref 140–400)
RBC: 3.37 10*6/uL — AB (ref 4.20–5.82)
RDW: 16.4 % — ABNORMAL HIGH (ref 11.0–14.6)
WBC: 3.1 10*3/uL — AB (ref 4.0–10.3)
lymph#: 0.7 10*3/uL — ABNORMAL LOW (ref 0.9–3.3)

## 2016-02-26 LAB — COMPREHENSIVE METABOLIC PANEL
ALT: 40 U/L (ref 0–55)
AST: 30 U/L (ref 5–34)
Albumin: 3.4 g/dL — ABNORMAL LOW (ref 3.5–5.0)
Alkaline Phosphatase: 188 U/L — ABNORMAL HIGH (ref 40–150)
Anion Gap: 9 mEq/L (ref 3–11)
BUN: 16.1 mg/dL (ref 7.0–26.0)
CALCIUM: 8.9 mg/dL (ref 8.4–10.4)
CHLORIDE: 105 meq/L (ref 98–109)
CO2: 26 mEq/L (ref 22–29)
Creatinine: 0.9 mg/dL (ref 0.7–1.3)
EGFR: 85 mL/min/{1.73_m2} — ABNORMAL LOW (ref >90)
GLUCOSE: 214 mg/dL — AB (ref 70–140)
POTASSIUM: 3.8 meq/L (ref 3.5–5.1)
SODIUM: 140 meq/L (ref 136–145)
Total Bilirubin: 0.52 mg/dL (ref 0.20–1.20)
Total Protein: 6.8 g/dL (ref 6.4–8.3)

## 2016-02-26 LAB — MAGNESIUM: MAGNESIUM: 1 mg/dL — AB (ref 1.5–2.5)

## 2016-02-26 LAB — FERRITIN

## 2016-02-26 MED ORDER — ALTEPLASE 2 MG IJ SOLR
2.0000 mg | Freq: Once | INTRAMUSCULAR | Status: DC | PRN
  Filled 2016-02-26: qty 2

## 2016-02-26 MED ORDER — HEPARIN SOD (PORK) LOCK FLUSH 100 UNIT/ML IV SOLN
500.0000 [IU] | Freq: Once | INTRAVENOUS | Status: AC | PRN
  Administered 2016-02-26: 500 [IU] via INTRAVENOUS
  Filled 2016-02-26: qty 5

## 2016-02-26 MED ORDER — SODIUM CHLORIDE 0.9 % IJ SOLN
10.0000 mL | INTRAMUSCULAR | Status: DC | PRN
  Administered 2016-02-26: 10 mL via INTRAVENOUS
  Filled 2016-02-26: qty 10

## 2016-02-26 MED ORDER — SODIUM CHLORIDE 0.9 % IV SOLN: 510.0000 mg | Freq: Once | INTRAVENOUS | Status: DC

## 2016-02-26 MED ORDER — SODIUM CHLORIDE 0.9 % IJ SOLN
3.0000 mL | Freq: Once | INTRAMUSCULAR | Status: DC | PRN
  Filled 2016-02-26: qty 10

## 2016-02-26 MED ORDER — HEPARIN SOD (PORK) LOCK FLUSH 100 UNIT/ML IV SOLN
250.0000 [IU] | Freq: Once | INTRAVENOUS | Status: DC | PRN
  Filled 2016-02-26: qty 5

## 2016-02-26 MED ORDER — SODIUM CHLORIDE 0.9 % IV SOLN
Freq: Once | INTRAVENOUS | Status: AC
  Administered 2016-02-26: 10:00:00 via INTRAVENOUS
  Filled 2016-02-26: qty 250

## 2016-02-26 NOTE — Patient Instructions (Signed)
Hypomagnesemia °Hypomagnesemia is a condition in which the level of magnesium in the blood is low. Magnesium is a mineral that is found in many foods. It is used in many different processes in the body. Hypomagnesemia can affect every organ in the body. It can cause life-threatening problems. °CAUSES °Causes of hypomagnesemia include: °· Not getting enough magnesium in your diet. °· Malnutrition. °· Problems with absorbing magnesium from the intestines. °· Dehydration. °· Alcohol abuse. °· Vomiting. °· Severe diarrhea. °· Some medicines, including medicines that make you urinate more. °· Certain diseases, such as kidney disease, diabetes, and overactive thyroid. °SIGNS AND SYMPTOMS °· Involuntary shaking or trembling of a body part (tremor). °· Confusion. °· Muscle weakness. °· Sensitivity to light, sound, and touch. °· Psychiatric issues, such as depression, irritability, or psychosis. °· Sudden tightening of muscles (muscle spasms). °· Tingling in the arms and legs. °· A feeling of fluttering of the heart. °These symptoms are more severe if magnesium levels drop suddenly. °DIAGNOSIS °To make a diagnosis, your health care provider will do a physical exam and order blood and urine tests. °TREATMENT °Treatment will depend on the cause and the severity of your condition. It may involve: °· A magnesium supplement. This can be taken in pill form. It can also be given through an IV tube. This is usually done if the condition is severe. °· Changes to your diet. You may be directed to eat foods that have a lot of magnesium, such as green leafy vegetables, peas, beans, and nuts. °· Eliminating alcohol from your diet. °HOME CARE INSTRUCTIONS °· Include foods with magnesium in your diet. Foods that are rich in magnesium include green vegetables, beans, nuts and seeds, and whole grains. °· Take medicines only as directed by your health care provider. °· Take magnesium supplements if your health care provider instructs you to  do that. Take them as directed. °· Have your magnesium levels monitored as directed by your health care provider. °· When you are active, drink fluids that contain electrolytes. °· Keep all follow-up visits as directed by your health care provider. This is important. °SEEK MEDICAL CARE IF: °· You get worse instead of better. °· Your symptoms return. °SEEK IMMEDIATE MEDICAL CARE IF: °· Your symptoms are severe. °  °This information is not intended to replace advice given to you by your health care provider. Make sure you discuss any questions you have with your health care provider. °  °Document Released: 07/10/2005 Document Revised: 11/04/2014 Document Reviewed: 05/30/2014 °Elsevier Interactive Patient Education ©2016 [employer] ° °

## 2016-02-29 ENCOUNTER — Ambulatory Visit (HOSPITAL_COMMUNITY)
Admission: RE | Admit: 2016-02-29 | Discharge: 2016-02-29 | Disposition: A | Discharge status: Home / Self Care | Payer: PPO | Source: Ambulatory Visit | Attending: Hematology | Admitting: Hematology

## 2016-02-29 ENCOUNTER — Encounter (HOSPITAL_COMMUNITY): Payer: Self-pay

## 2016-02-29 DIAGNOSIS — I700 Atherosclerosis of aorta: Secondary | ICD-10-CM | POA: Diagnosis not present

## 2016-02-29 DIAGNOSIS — C187 Malignant neoplasm of sigmoid colon: Secondary | ICD-10-CM | POA: Diagnosis not present

## 2016-02-29 DIAGNOSIS — R918 Other nonspecific abnormal finding of lung field: Secondary | ICD-10-CM | POA: Insufficient documentation

## 2016-02-29 DIAGNOSIS — C787 Secondary malignant neoplasm of liver and intrahepatic bile duct: Secondary | ICD-10-CM | POA: Diagnosis not present

## 2016-02-29 DIAGNOSIS — I2510 Atherosclerotic heart disease of native coronary artery without angina pectoris: Secondary | ICD-10-CM | POA: Diagnosis not present

## 2016-02-29 MED ORDER — IOPAMIDOL (ISOVUE-300) INJECTION 61%
100.0000 mL | Freq: Once | INTRAVENOUS | Status: AC | PRN
  Administered 2016-02-29: 100 mL via INTRAVENOUS

## 2016-03-04 ENCOUNTER — Encounter: Payer: Self-pay | Admitting: Hematology

## 2016-03-04 ENCOUNTER — Telehealth: Payer: Self-pay | Admitting: *Deleted

## 2016-03-04 ENCOUNTER — Other Ambulatory Visit (HOSPITAL_BASED_OUTPATIENT_CLINIC_OR_DEPARTMENT_OTHER): Payer: PPO

## 2016-03-04 ENCOUNTER — Ambulatory Visit (HOSPITAL_BASED_OUTPATIENT_CLINIC_OR_DEPARTMENT_OTHER): Payer: PPO | Admitting: Hematology

## 2016-03-04 ENCOUNTER — Ambulatory Visit: Payer: PPO

## 2016-03-04 ENCOUNTER — Telehealth: Payer: Self-pay | Admitting: Hematology

## 2016-03-04 ENCOUNTER — Ambulatory Visit (HOSPITAL_BASED_OUTPATIENT_CLINIC_OR_DEPARTMENT_OTHER): Payer: PPO

## 2016-03-04 ENCOUNTER — Other Ambulatory Visit: Payer: Self-pay | Admitting: *Deleted

## 2016-03-04 VITALS — BP 111/65 | HR 95 | Temp 97.7°F | Resp 18 | Ht 65.0 in | Wt 143.0 lb

## 2016-03-04 DIAGNOSIS — D509 Iron deficiency anemia, unspecified: Secondary | ICD-10-CM

## 2016-03-04 DIAGNOSIS — C787 Secondary malignant neoplasm of liver and intrahepatic bile duct: Secondary | ICD-10-CM | POA: Diagnosis not present

## 2016-03-04 DIAGNOSIS — C772 Secondary and unspecified malignant neoplasm of intra-abdominal lymph nodes: Secondary | ICD-10-CM

## 2016-03-04 DIAGNOSIS — D500 Iron deficiency anemia secondary to blood loss (chronic): Secondary | ICD-10-CM | POA: Diagnosis not present

## 2016-03-04 DIAGNOSIS — Z5111 Encounter for antineoplastic chemotherapy: Secondary | ICD-10-CM

## 2016-03-04 DIAGNOSIS — C187 Malignant neoplasm of sigmoid colon: Secondary | ICD-10-CM | POA: Diagnosis not present

## 2016-03-04 DIAGNOSIS — C7800 Secondary malignant neoplasm of unspecified lung: Secondary | ICD-10-CM | POA: Diagnosis not present

## 2016-03-04 DIAGNOSIS — I5042 Chronic combined systolic (congestive) and diastolic (congestive) heart failure: Secondary | ICD-10-CM

## 2016-03-04 DIAGNOSIS — E119 Type 2 diabetes mellitus without complications: Secondary | ICD-10-CM

## 2016-03-04 DIAGNOSIS — Z5112 Encounter for antineoplastic immunotherapy: Secondary | ICD-10-CM | POA: Diagnosis not present

## 2016-03-04 DIAGNOSIS — D6481 Anemia due to antineoplastic chemotherapy: Secondary | ICD-10-CM

## 2016-03-04 DIAGNOSIS — Z95828 Presence of other vascular implants and grafts: Secondary | ICD-10-CM

## 2016-03-04 DIAGNOSIS — I10 Essential (primary) hypertension: Secondary | ICD-10-CM

## 2016-03-04 DIAGNOSIS — C7972 Secondary malignant neoplasm of left adrenal gland: Secondary | ICD-10-CM

## 2016-03-04 LAB — CBC WITH DIFFERENTIAL/PLATELET
BASO%: 0.5 % (ref 0.0–2.0)
BASOS ABS: 0 10*3/uL (ref 0.0–0.1)
EOS ABS: 0.1 10*3/uL (ref 0.0–0.5)
EOS%: 3.9 % (ref 0.0–7.0)
HCT: 29.9 % — ABNORMAL LOW (ref 38.4–49.9)
HEMOGLOBIN: 9.7 g/dL — AB (ref 13.0–17.1)
LYMPH%: 16.9 % (ref 14.0–49.0)
MCH: 29.1 pg (ref 27.2–33.4)
MCHC: 32.3 g/dL (ref 32.0–36.0)
MCV: 89.8 fL (ref 79.3–98.0)
MONO#: 0.4 10*3/uL (ref 0.1–0.9)
MONO%: 13.2 % (ref 0.0–14.0)
NEUT#: 2.1 10*3/uL (ref 1.5–6.5)
NEUT%: 65.5 % (ref 39.0–75.0)
Platelets: 118 10*3/uL — ABNORMAL LOW (ref 140–400)
RBC: 3.33 10*6/uL — AB (ref 4.20–5.82)
RDW: 18.2 % — AB (ref 11.0–14.6)
WBC: 3.1 10*3/uL — ABNORMAL LOW (ref 4.0–10.3)
lymph#: 0.5 10*3/uL — ABNORMAL LOW (ref 0.9–3.3)

## 2016-03-04 LAB — COMPREHENSIVE METABOLIC PANEL
ALT: 43 U/L (ref 0–55)
ANION GAP: 8 meq/L (ref 3–11)
AST: 42 U/L — ABNORMAL HIGH (ref 5–34)
Albumin: 3.5 g/dL (ref 3.5–5.0)
Alkaline Phosphatase: 207 U/L — ABNORMAL HIGH (ref 40–150)
BILIRUBIN TOTAL: 0.76 mg/dL (ref 0.20–1.20)
BUN: 19.3 mg/dL (ref 7.0–26.0)
CO2: 24 meq/L (ref 22–29)
Calcium: 8.9 mg/dL (ref 8.4–10.4)
Chloride: 106 mEq/L (ref 98–109)
Creatinine: 0.9 mg/dL (ref 0.7–1.3)
EGFR: 84 mL/min/{1.73_m2} — AB (ref >90)
Glucose: 283 mg/dl — ABNORMAL HIGH (ref 70–140)
POTASSIUM: 3.9 meq/L (ref 3.5–5.1)
Sodium: 138 mEq/L (ref 136–145)
TOTAL PROTEIN: 7 g/dL (ref 6.4–8.3)

## 2016-03-04 LAB — IRON AND TIBC
%SAT: 37 % (ref 20–55)
IRON: 80 ug/dL (ref 42–163)
TIBC: 220 ug/dL (ref 202–409)
UIBC: 140 ug/dL (ref 117–376)

## 2016-03-04 LAB — MAGNESIUM: MAGNESIUM: 1 mg/dL — AB (ref 1.5–2.5)

## 2016-03-04 MED ORDER — HEPARIN SOD (PORK) LOCK FLUSH 100 UNIT/ML IV SOLN
500.0000 [IU] | Freq: Once | INTRAVENOUS | Status: AC | PRN
  Administered 2016-03-04: 500 [IU]
  Filled 2016-03-04: qty 5

## 2016-03-04 MED ORDER — ATROPINE SULFATE 1 MG/ML IJ SOLN
0.5000 mg | Freq: Once | INTRAMUSCULAR | Status: AC | PRN
  Administered 2016-03-04: 0.5 mg via INTRAVENOUS

## 2016-03-04 MED ORDER — SODIUM CHLORIDE 0.9 % IJ SOLN
10.0000 mL | INTRAMUSCULAR | Status: DC | PRN
Start: 2016-03-04 — End: 2016-03-04
  Administered 2016-03-04: 10 mL
  Filled 2016-03-04: qty 10

## 2016-03-04 MED ORDER — SODIUM CHLORIDE 0.9 % IV SOLN
Freq: Once | INTRAVENOUS | Status: AC
  Administered 2016-03-04: 10:00:00 via INTRAVENOUS
  Filled 2016-03-04: qty 4

## 2016-03-04 MED ORDER — IRINOTECAN HCL CHEMO INJECTION 100 MG/5ML: 220.0000 mg | Freq: Once | INTRAVENOUS | Status: DC

## 2016-03-04 MED ORDER — SODIUM CHLORIDE 0.9 % IJ SOLN
10.0000 mL | INTRAMUSCULAR | Status: DC | PRN
  Administered 2016-03-04: 10 mL via INTRAVENOUS
  Filled 2016-03-04: qty 10

## 2016-03-04 MED ORDER — SODIUM CHLORIDE 0.9 % IV SOLN
6.1000 mg/kg | Freq: Once | INTRAVENOUS | Status: AC
  Administered 2016-03-04: 400 mg via INTRAVENOUS
  Filled 2016-03-04: qty 20

## 2016-03-04 MED ORDER — SODIUM CHLORIDE 0.9 % IV SOLN
Freq: Once | INTRAVENOUS | Status: AC
  Administered 2016-03-04: 10:00:00 via INTRAVENOUS

## 2016-03-04 MED ORDER — ATROPINE SULFATE 1 MG/ML IJ SOLN
INTRAMUSCULAR | Status: AC
  Filled 2016-03-04: qty 1

## 2016-03-04 MED ORDER — SODIUM CHLORIDE 0.9 % IV SOLN
220.0000 mg | Freq: Once | INTRAVENOUS | Status: AC
  Administered 2016-03-04: 220 mg via INTRAVENOUS
  Filled 2016-03-04: qty 4

## 2016-03-04 NOTE — Progress Notes (Signed)
[name]  [phone] [phone]  Clinic follow up Note   Patient Care Team: [name], MD as PCP - General (Specialist) [name], CRNP as Nurse Practitioner (Nurse Practitioner) Truitt Merle, MD as Consulting Physician (Hematology) Ladene Artist, MD as Consulting Physician (Gastroenterology) 03/04/2016  CHIEF COMPLAINTS Follow up metastatic sigmoid colon cancer  Oncology History   He is a Metastatic colon cancer to liver   Staging form: Colon and Rectum, AJCC 7th Edition     Clinical: Stage Unknown ([name], NX, M1) - Unsigned      Cancer of sigmoid colon ([name])   10/28/2014 Tumor Marker AFP 3.2 CEA > 10,000 CA 19-9 12,929.6. tumor (-) KRAS and NRAS mutation.    11/10/2014 Imaging PET scan showed hypermetabolic mass in the sigmoid colon was noted metastasis in the retroperitoneum. Probable left adrenal and pulmonary metastasis, and diffuse liver metastasis.   11/21/2014 Initial Diagnosis Metastatic colon cancer to liver, lung, abd nodes and left adrenal gland. Diagnosis was made by liver biopsy.    11/30/2014 -  Chemotherapy First line chemo [id number], Panitumumab added from second cycle    12/28/2014 - 12/31/2014 Hospital Admission Was admitted for dehydration, neutropenia fever with UTI, and severe skin rash.   02/08/2015 - 02/12/2015 Hospital Admission He was admitted for upper GI bleeding, e.g. showed a gastric ulcer with clots, status post appendectomy injection. He also received a blood transfusion.   03/01/2015 Tumor Marker CEA 694, CA19.9 462   03/13/2015 Imaging CT CAP showed partial response, no new lesions.    03/15/2015 - 05/23/2015 Chemotherapy restart FOLFOX, held again on 8/9 due to GI bleeding    05/31/2015 - 06/02/2015 Hospital Admission He was admitted to [facility] in [place] due to upper GI bleeding, EGD showed gastric and dudenal ulcers    06/27/2015 - 06/29/2015 Hospital Admission he was admitted for dairrhea and pancolitis, c-diff and  stool cultures were negative, treated with antibiotics   07/12/2015 -  Chemotherapy panitumumab 45m/kg, every 2 weeks   11/08/2015 - 11/10/2015 Hospital Admission Pt was admitted for sepsis and hypotension, was found to have (+) influenza A and treated.    11/30/2015 Imaging  CT scans reviewed continued improvement in the liver metastasis, no other new lesions.     HISTORY OF PRESENTING ILLNESS:  [name] 70y.o. male is here because of abnormal CT findings, which is very suspicious for malignancy. [place] He came in with his son and an interpreter.  He has been feeling fatigued since two month ago. He is still able to do all ADLs. He otherwise denies any pain, bloating or nausea.  He lost about 20lbs in 3 month. His appetite is lower than before, eats less, no change of his bowl habits.  She denied any hematochezia or melana. Per his son, he has had some personality changes daily, irritable, slightly confused some time.  He was evaluated by his primary care physician. Lab test reviewed hepatitis B infection, which he did not know before, and elevated alkaline phosphatase, his liver function and the rest of the liver function was not remarkable. [place] was obtained on 07/22/2014, which showed diffusely abnormal liver with multiple echogenic lesions. CT of abdomen with and without contrast was done on 08/26/2014, which reviewed here at a medically with multiple large partially calcified hepatic masses consistent with metastatic disease. Mild retroperitoneal adenopathy with the largest node measuring 1.6  cm. And nonspecific 1.4 cm left adrenal nodule was also noticed. His tumor marker showed CEA greater than 10,000, CA 19-9 12,929, AFP 3.2 (normal). He was referred to [place] [facility] liver clinic and was evaluated by nurse practitioner [name]. Treatment for hepatitis B was not recommended based on his virus load.  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 He was evaluated by a cardiologist in 2014. He denies any significant dyspnea on exertion. No leg swollen.  CURRENT THERAPY: panitumumab 105m/kg, every 2 weeks, started on 07/12/2015, Irinotecan 1830mm2 every 2 weeks added on 09/19/2015, dose decreased to 16037m2 from cycle 2, and further decreased to 125m48m from cycle 10 due to diarrhea and anorexia.  He also receives IV magnesium 6g weekly   INTERIM HISTORY: [name] for follow-up and chemotherapy. He is accompanied by his son and interpreter to the clinic today. He has been tolerating reduced dose. She can much better, no significant diarrhea, nausea, or other side effects. He has been eating well, has good energy level, and has gained weight back, 12 pounds in the past month. No fever or chills no other complaints. He still has mild diffuse skin rash, tolerable.  MEDICAL HISTORY:  Past Medical History  Diagnosis Date  . Tachycardia   . Abnormal EKG   . Hypertension   . Non-ischemic cardiomyopathy (HCC)[name]. Hepatitis B   . H. pylori infection   . Gastric ulcer   . Metastatic colon cancer to liver (HCC)[name]. Diabetes mellitus without complication (HCC)[name]  metformin    SURGICAL HISTORY: Past Surgical History  Procedure Laterality Date  . Nuclear stress test  03/03/2013    High risk - consistent with nonischemic cardiomyopathy  . Left and right heart catheterization with coronary angiogram N/A 03/29/2013    Procedure: LEFT AND RIGHT HEART CATHETERIZATION WITH CORONARY ANGIOGRAM;  Surgeon: KennPixie Casino;  [facility];  Service: Cardiovascular;  Laterality: N/A;  . Esophagogastroduodenoscopy N/A 02/08/2015    Procedure: ESOPHAGOGASTRODUODENOSCOPY (EGD);  Surgeon: MalcLadene Artist;  [facility];  Service: Endoscopy;  Laterality: N/A;  . Esophagogastroduodenoscopy (egd) with propofol N/A 08/15/2015    Procedure:  ESOPHAGOGASTRODUODENOSCOPY (EGD) WITH PROPOFOL;  Surgeon: MalcLadene Artist;  Location: [facility];  Service: Endoscopy;  Laterality: N/A;    SOCIAL HISTORY: Social History   Social History  . Marital Status: Married    Spouse Name: N/A  . Number of Children: N/A  . Years of Education: N/A   Occupational History  . Not on file.   Social History Main Topics  . Smoking status: Former Smoker    Types: Cigarettes    Quit date: 11/07/2008  . Smokeless tobacco: Never Used  . Alcohol Use: Yes     Comment: occasional  . Drug Use: No  . Sexual Activity: No   Other Topics Concern  . Not on file   Social History Narrative   Married   Enjoys walking   Has lived in [place]    FAMILY HISTORY: No family history of liver disease or malignancy.  ALLERGIES:  has No Known Allergies.  MEDICATIONS:  Current Outpatient Prescriptions on File Prior to Visit  Medication Sig Dispense Refill  . clindamycin (CLINDAGEL) 1 % gel Apply topically 2 (two) times daily. 30 g 2  . diphenoxylate-atropine (LOMOTIL) 2.5-0.025 MG tablet Take 1-2 tablets by mouth 4 (four) times daily as needed for diarrhea  or loose stools. 30 tablet 0  . folic acid (FOLVITE) 1 MG tablet Take 1 tablet (1 mg total) by mouth daily. 30 tablet 3  . hydrocortisone 1 % ointment Apply 1 application topically 2 (two) times daily. 56 g 0  . lidocaine-prilocaine (EMLA) cream Apply 1 application topically as needed. Apply to [facility] cath at least one hour before needle stick. 30 g 2  . lisinopril-hydrochlorothiazide (PRINZIDE,ZESTORETIC) 10-12.5 MG tablet     . loperamide (IMODIUM) 2 MG capsule Take 2 capsules (4 mg total) by mouth 4 (four) times daily as needed for diarrhea or loose stools (NO MORE THAN 8 TABLETS PER DAY.). 60 capsule 3  . magic mouthwash SOLN Take 5 mLs by mouth 4 (four) times daily. 120 mL 1  . magnesium oxide (MAG-OX) 400 (241.3 Mg) MG tablet Take 1 tablet (400 mg total) by mouth 3 (three) times daily. 60  tablet 3  . metFORMIN (GLUCOPHAGE) 500 MG tablet Take 1 tablet (500 mg total) by mouth 2 (two) times daily with a meal. THIS IS A ONE TIME ORDER.  FUTURE REFILLS NEED TO BE DONE BY PRIMARY MD. 90 tablet 0  . nadolol (CORGARD) 20 MG tablet Take 1 tablet (20 mg total) by mouth 2 (two) times daily. THIS IS A ONE TIME ORDER.  FUTURE REFILLS NEED TO BE DONE BY PRIMARY MD. 60 tablet 1  . ondansetron (ZOFRAN) 8 MG tablet Take 1 tablet (8 mg total) by mouth every 8 (eight) hours as needed for nausea or vomiting. 45 tablet 2  . pantoprazole (PROTONIX) 40 MG tablet Take 1 tablet (40 mg total) by mouth daily. 30 tablet 2  . prochlorperazine (COMPAZINE) 10 MG tablet Take 1 tablet (10 mg total) by mouth every 6 (six) hours as needed for nausea or vomiting. 30 tablet 2   Current Facility-Administered Medications on File Prior to Visit  Medication Dose Route Frequency Provider Last Rate Last Dose  . 0.9 %  sodium chloride infusion   Intravenous Once Truitt Merle, MD      . sodium chloride 0.9 % injection 10 mL  10 mL Intracatheter PRN Truitt Merle, MD   10 mL at 10/03/15 1705  ;   REVIEW OF SYSTEMS:   Constitutional: Denies fevers, chills or abnormal night sweats, (+) fatigue  Eyes: Denies blurriness of vision, double vision or watery eyes Ears, nose, mouth, throat, and face: Denies mucositis or sore throat Respiratory: Denies cough, dyspnea or wheezes Cardiovascular: Denies palpitation, chest discomfort or lower extremity swelling Gastrointestinal: Denies nausea, heartburn or change in bowel habits Skin: Denies abnormal skin rashes Lymphatics: Denies new lymphadenopathy or easy bruising Neurological:Denies numbness, tingling or new weaknesses Behavioral/Psych: Mood is stable, no new changes, (+) insomnia All other systems were reviewed with the patient and are negative.  PHYSICAL EXAMINATION: BP 111/65 mmHg  Pulse 95  Temp(Src) 97.7 F (36.5 C)  Resp 18  Ht 5' 5" (1.651 m)  Wt 143 lb (64.864 kg)  BMI  23.80 kg/m2  SpO2 97%  ECOG PERFORMANCE STATUS: 1 Vital sign were taken at in the infusion room, within normal limits. GENERAL:alert, no distress and comfortable SKIN: (+) Dry skin,  no skin ulcer, (+) scatter skin rashes on his neck and upper chest, some are resolving with skin pigmentation. No discharge. EYES: normal, conjunctiva are pink and non-injected, sclera clear OROPHARYNX:no exudate, no erythema and lips, buccal mucosa, and tongue with mild discoloration at the tip.  NECK: supple, thyroid normal size, non-tender, without nodularity LYMPH:  no  palpable lymphadenopathy in the cervical, axillary or inguinal LUNGS: clear to auscultation and percussion with normal breathing effort, no rales   HEART: regular rate & rhythm and no murmurs and no lower extremity edema ABDOMEN:abdomen soft, non-tender, no hepatomegaly, no splenomegaly and normal bowel sounds Musculoskeletal:no cyanosis of digits and no clubbing  PSYCH: alert & oriented x 3 with fluent speech NEURO: no focal motor/sensory deficits  LABORATORY DATA:  I have reviewed the data as listed CBC Latest Ref Rng 03/04/2016 02/26/2016 02/19/2016  WBC 4.0 - 10.3 10e3/uL 3.1(L) 3.1(L) 3.2(L)  Hemoglobin 13.0 - 17.1 g/dL 9.7(L) 9.9(L) 10.5(L)  Hematocrit 38.4 - 49.9 % 29.9(L) 29.6(L) 32.1(L)  Platelets 140 - 400 10e3/uL 118(L) 111(L) 150   ANC is 3.4  CMP Latest Ref Rng 03/04/2016 02/26/2016 02/19/2016  Glucose 70 - 140 mg/dl 283(H) 214(H) 127  BUN 7.0 - 26.0 mg/dL 19.3 16.1 7.9  Creatinine 0.7 - 1.3 mg/dL 0.9 0.9 0.8  Sodium 136 - 145 mEq/L 138 140 144  Potassium 3.5 - 5.1 mEq/L 3.9 3.8 4.1  CO2 22 - 29 mEq/L _0 Calcium 8.4 - 10.4 mg/dL 8.9 8.9 8.8  Total Protein 6.4 - 8.3 g/dL 7.0 6.8 7.0  Total Bilirubin 0.20 - 1.20 mg/dL 0.76 0.52 0.75  Alkaline Phos 40 - 150 U/L 207(H) 188(H) 160(H)  AST 5 - 34 U/L 42(H) 30 30  ALT 0 - 55 U/L 43 40 29   Mag pending  ANC 2.2 today   INITIAL tumor markers AFP 3.2 CEA > 10,000 CA 19-9  12,929.6  Results for BRYNDEN, THUNE (MRN [id number]) as of 02/05/2016 09:18  Ref. Range 10/03/2015 08:49 10/31/2015 08:01 01/08/2016 09:53  CA 19-9 Latest Ref Range: <35.0 U/mL 62.3 (H) 40.7 (H)   CEA Latest Units: ng/mL 66.4 (H) 50.6 (H) 80.3 (H)  CEA Latest Ref Range: 0.0-4.7 ng/mL   110.6 (H)    Pathology report  Liver, needle/core biopsy - METASTATIC ADENOCARCINOMA, SEE COMMENT. Microscopic Comment The adenocarcinoma demonstrates the following immunophenotype: Cytokeratin 7 - negative expression. Cytokeratin 20 - strong diffuse expression. CD2 - strong diffuse expression. Overall the morphology and immunophenotype are that of metastatic adenocarcinoma primary to colorectum. The recent nuclear medicine scan demonstrating sigmoid mass with associated liver masses is noted.  [facility] test result:    RADIOGRAPHIC STUDIES: I have personally reviewed the outside CT scan image with patient and his son.   CT chest, abdomen and pelvis with IV contrast oN  02/29/2016 IMPRESSION: 1. Liver metastasis are improved from previous exam. No new or progressive liver lesions identified 2. Similar appearance of right adrenal gland metastasis. 3. There are 2 small nodules within the left lung, which are new from previous exam and may represent early pulmonary metastasis. 4. Aortic atherosclerosis and multi vessel coronary artery calcification.   ASSESSMENT & PLAN:  70 year old [place] male, with past history of hypertension and dilated nonischemic gammopathy with EF 25%, no clinical signs of heart failure, who was found to have hepatitis B infection lately, and multiple liver lesions on the CT scan. He has extremely high CEA and CA 19-9 levels. PET scan reviewed a hypermetabolic sigmoid colon mass, diffuse liver metastasis, probable lung and adrenal gland metastasis.  1. Metastatic sigmoid colon cancer, with diffuse liver, lungs, node and left adrenal gland metastases. KRAS/NRAS wild type,  MSI-stable -Liver biopsy showed metastatic adenocarcinoma. His tumor were strongly positive for CK20 and CD2, consistent with primary colorectal primary. KRAS and NRAS mutations were not detected.  -Pt understands that  this is incurable cancer, and he has very high disease burden and overall prognosis is poor. The treatment goal is palliative -He had excellent response to first-line FOLFOX, but treatment was complicated with GI bleeding and neutropenic fever, resolved now. -he is currently on second line chemotherapy with irritecan and panitumumab, tolerating well  - I reviewed his restaging CT scan from 02/29/2016, which showed a continuous responding in liver metastasis, no other new lesions. -here to severe diarrhea from Irinotecan, dose has been reduced from 216m (1627mm2) to 20069m125m60m, rounded), will slightly increase his dose to 220 mg today, and if tolerates well, we'll go up to 240 mg (140mg69m from next cycle -He is clinical doing well, lab reviewed, adequate for treatment, we'll continue chemotherapy today.   2. Grade 1-2 skin rashes, stable  -Secondary to panitumumab, improved and stable lately  -continue hydrocortisone 2.5%, and clindamycin gel 1%  twice daily as needed  -He knows to avoid sun exposure, and call me if it gets worse.  3. Gastric ulcer with significant GI bleeding in April and Aug 2016 -He is on PPI, continue once daily  -Repeat his EGD on 08/15/2015 showed near complete healing of his gastric ulcer -continue Nadolol 20mg 11m per Dr. Stark Fuller Planncourage him to follow-up with Dr. Stark Fuller Planype 2 DM  -HbA1c was 8.5, blood glucose not well controlled, he run out of metformin  -I encourage him to follow-up with his primary care physician Dr. WilliaJimmye Normancall his office for metformin refill today   5. HTN, Dilated nonischemic ischemia cardiomyopathy with EF 25% -He is clinically doing well without symptoms of CHF. However this is probably going to impact his  chemotherapy.Will try to avoid cardiotoxic chemotherapy agent and avoid fluid overload during chemotherapy. -Continue follow-up with cardiology. -will hold on his lisinopril-HCTZ for now, his BP has been normal lately   6 Hepatitis B carrier, with mild portal hypertension  -Per liver clinic, no need for treatment. Follow-up with Dr. StarksSilvio PateMalnutrition -I encouraged him to eat more, and take supplements as needed. -improved, gained weight back after I reduce his chemotherapy dose  -follow up with Dietitian   8. Anemia secondary to GI bleeding, iron deficiency and chemo  -Repeat lab on 06/06/2015 showed ferritin 117, serum iron 24, saturation 8%, which supports iron deficiency -He received IV Feraheme again in Aug 2016 after GI bleeding  -Repeat a ferritin was 273 on 10/03/2015, much improved  -he received iv feraheme again on 11/27/2015 and 12/04/2015, but anemia did not improve much. -His anemia has been getting worse lately, probably related to chemotherapy, we'll close monitor any signs of GI bleeding. -Blood transfusion if hemoglobin less than 8  11. hypomagnesemia  -secondary to panitumumab -he receives IV mag 6 g every 1-2 weeks -He is taking magnesium pill 2 tablets 3 times a day, we'll hold it for now due to his diarrhea -follow up weekly    Plan -scan reviewed, good partial response, will continue chemo  -Lab results reviewed, adequate for treatment, we'll proceed chemotherapy today,  Slightly increase irinotecan dose to 220mg, 44molerates well, will increase to 240mg ne40mycle and continue at that dose, continue panitumumab every 2 weeks  -Return in 1 week for lab and magnesium infusion -I'll see him back in 2 weeks  All questions were answered. The patient knows to call the clinic with any problems, questions or concerns.  I spent 25 minutes counseling the patient face to face. The  total time spent in the appointment was 30 minutes and more than 50% was on  counseling.     Truitt Merle, MD 03/04/2016

## 2016-03-04 NOTE — Patient Instructions (Signed)
[facility] Discharge Instructions for Patients Receiving Chemotherapy  Today you received the following chemotherapy agents: Vectibix & Irinotican.  To help prevent nausea and vomiting after your treatment, we encourage you to take your nausea medication as prescribed.   If you develop nausea and vomiting that is not controlled by your nausea medication, call the clinic.   BELOW ARE SYMPTOMS THAT SHOULD BE REPORTED IMMEDIATELY:  *FEVER GREATER THAN 100.5 F  *CHILLS WITH OR WITHOUT FEVER  NAUSEA AND VOMITING THAT IS NOT CONTROLLED WITH YOUR NAUSEA MEDICATION  *UNUSUAL SHORTNESS OF BREATH  *UNUSUAL BRUISING OR BLEEDING  TENDERNESS IN MOUTH AND THROAT WITH OR WITHOUT PRESENCE OF ULCERS  *URINARY PROBLEMS  *BOWEL PROBLEMS  UNUSUAL RASH Items with * indicate a potential emergency and should be followed up as soon as possible.  Feel free to call the clinic you have any questions or concerns. The clinic phone number is [phone].  Please show the [facility] at check-in to the Emergency Department and triage nurse.  Hypomagnesemia Hypomagnesemia is a condition in which the level of magnesium in the blood is low. Magnesium is a mineral that is found in many foods. It is used in many different processes in the body. Hypomagnesemia can affect every organ in the body. It can cause life-threatening problems. CAUSES Causes of hypomagnesemia include:  Not getting enough magnesium in your diet.  Malnutrition.  Problems with absorbing magnesium from the intestines.  Dehydration.  Alcohol abuse.  Vomiting.  Severe diarrhea.  Some medicines, including medicines that make you urinate more.  Certain diseases, such as kidney disease, diabetes, and overactive thyroid. SIGNS AND SYMPTOMS  Involuntary shaking or trembling of a body part (tremor).  Confusion.  Muscle weakness.  Sensitivity to light, sound, and touch.  Psychiatric issues, such as  depression, irritability, or psychosis.  Sudden tightening of muscles (muscle spasms).  Tingling in the arms and legs.  A feeling of fluttering of the heart. These symptoms are more severe if magnesium levels drop suddenly. DIAGNOSIS To make a diagnosis, your health care provider will do a physical exam and order blood and urine tests. TREATMENT Treatment will depend on the cause and the severity of your condition. It may involve:  A magnesium supplement. This can be taken in pill form. It can also be given through an IV tube. This is usually done if the condition is severe.  Changes to your diet. You may be directed to eat foods that have a lot of magnesium, such as green leafy vegetables, peas, beans, and nuts.  Eliminating alcohol from your diet. HOME CARE INSTRUCTIONS  Include foods with magnesium in your diet. Foods that are rich in magnesium include green vegetables, beans, nuts and seeds, and whole grains.  Take medicines only as directed by your health care provider.  Take magnesium supplements if your health care provider instructs you to do that. Take them as directed.  Have your magnesium levels monitored as directed by your health care provider.  When you are active, drink fluids that contain electrolytes.  Keep all follow-up visits as directed by your health care provider. This is important. SEEK MEDICAL CARE IF:  You get worse instead of better.  Your symptoms return. SEEK IMMEDIATE MEDICAL CARE IF:  Your symptoms are severe.   This information is not intended to replace advice given to you by your health care provider. Make sure you discuss any questions you have with your health care provider.   Document Released:  07/10/2005 Document Revised: 11/04/2014 Document Reviewed: 05/30/2014 Elsevier Interactive Patient Education [employer].

## 2016-03-04 NOTE — Telephone Encounter (Signed)
Per staff message and POF I have scheduled appts. Advised scheduler of appts and no available on 5/30. JMW

## 2016-03-04 NOTE — Patient Instructions (Signed)

## 2016-03-04 NOTE — Telephone Encounter (Signed)
Pt will p/u in tx room °

## 2016-03-12 ENCOUNTER — Other Ambulatory Visit (HOSPITAL_BASED_OUTPATIENT_CLINIC_OR_DEPARTMENT_OTHER): Payer: PPO

## 2016-03-12 ENCOUNTER — Ambulatory Visit (HOSPITAL_BASED_OUTPATIENT_CLINIC_OR_DEPARTMENT_OTHER): Payer: PPO

## 2016-03-12 VITALS — BP 101/62 | HR 101 | Temp 97.9°F | Resp 18

## 2016-03-12 DIAGNOSIS — C187 Malignant neoplasm of sigmoid colon: Secondary | ICD-10-CM

## 2016-03-12 DIAGNOSIS — D509 Iron deficiency anemia, unspecified: Secondary | ICD-10-CM

## 2016-03-12 DIAGNOSIS — Z95828 Presence of other vascular implants and grafts: Secondary | ICD-10-CM

## 2016-03-12 LAB — CBC WITH DIFFERENTIAL/PLATELET
BASO%: 0.4 % (ref 0.0–2.0)
BASOS ABS: 0 10*3/uL (ref 0.0–0.1)
EOS ABS: 0.1 10*3/uL (ref 0.0–0.5)
EOS%: 3.7 % (ref 0.0–7.0)
HEMATOCRIT: 29.8 % — AB (ref 38.4–49.9)
HEMOGLOBIN: 9.8 g/dL — AB (ref 13.0–17.1)
LYMPH%: 25.7 % (ref 14.0–49.0)
MCH: 29.3 pg (ref 27.2–33.4)
MCHC: 33 g/dL (ref 32.0–36.0)
MCV: 88.7 fL (ref 79.3–98.0)
MONO#: 0.5 10*3/uL (ref 0.1–0.9)
MONO%: 15.2 % — ABNORMAL HIGH (ref 0.0–14.0)
NEUT%: 55 % (ref 39.0–75.0)
NEUTROS ABS: 1.9 10*3/uL (ref 1.5–6.5)
PLATELETS: 148 10*3/uL (ref 140–400)
RBC: 3.36 10*6/uL — ABNORMAL LOW (ref 4.20–5.82)
RDW: 17.2 % — ABNORMAL HIGH (ref 11.0–14.6)
WBC: 3.4 10*3/uL — ABNORMAL LOW (ref 4.0–10.3)
lymph#: 0.9 10*3/uL (ref 0.9–3.3)

## 2016-03-12 LAB — MAGNESIUM: Magnesium: 1.1 mg/dl — CL (ref 1.5–2.5)

## 2016-03-12 MED ORDER — ALTEPLASE 2 MG IJ SOLR
2.0000 mg | Freq: Once | INTRAMUSCULAR | Status: DC | PRN
  Filled 2016-03-12: qty 2

## 2016-03-12 MED ORDER — SODIUM CHLORIDE 0.9 % IJ SOLN
10.0000 mL | INTRAMUSCULAR | Status: DC | PRN
  Administered 2016-03-12: 10 mL via INTRAVENOUS
  Filled 2016-03-12: qty 10

## 2016-03-12 MED ORDER — SODIUM CHLORIDE 0.9 % IV SOLN
Freq: Once | INTRAVENOUS | Status: AC
  Administered 2016-03-12: 14:00:00 via INTRAVENOUS
  Filled 2016-03-12: qty 250

## 2016-03-12 MED ORDER — HEPARIN SOD (PORK) LOCK FLUSH 100 UNIT/ML IV SOLN
500.0000 [IU] | Freq: Once | INTRAVENOUS | Status: AC | PRN
  Administered 2016-03-12: 500 [IU] via INTRAVENOUS
  Filled 2016-03-12: qty 5

## 2016-03-12 NOTE — Patient Instructions (Signed)
Magnesium Sulfate injection ?y l thu?c g? MAGNESIUM SULFATE l ch?t ?i?n gi?i d?ng tim th??ng ???c dng ?? ?i?u tr? m?c magnesium th?p ? trong mu v ?? phng ng?a ho?c ki?m sot m?t s? lo?i co gi?t. Thu?c ny c th? ???c dng cho nh?ng m?c ?ch khc; hy h?i ng??i cung c?p d?ch v? y t? ho?c d??c s? c?a mnh, n?u qu v? c th?c m?c. Ti c?n ph?i bo cho ng??i cung c?p d?ch v? y t? c?a mnh ?i?u g tr??c khi dng thu?c ny? H? c?n bi?t li?u qu v? c b?t k? tnh tr?ng no sau ?y khng: -b?nh tim -ti?n s? tim ??p khng ??u -b?nh th?n -pha?n ??ng b?t th???ng ho??c di? ??ng v??i magnesium sulfate ho?c cc lo?i thu?c -pha?n ??ng b?t th???ng ho??c di? ??ng v??i th??c ph?m, thu?c nhu?m, ho??c ch?t ba?o qua?n -?ang c thai ho??c ??nh co? thai -?ang cho con bu? Ti nn s? d?ng thu?c ny nh? th? no? Thu?c ny ?? truy?n vo t?nh m?ch. Thu?c ny ???c s? d?ng b?i chuyn vin y t? ? b?nh vi?n ho?c ? phng m?ch. Hy bn v?i bc s? nhi khoa c?a qu v? v? vi?c dng thu?c ny ? tr? em. Thu?c ny c th? ???c k toa trong nh?ng tr??ng h?p ch?n l?c, nh?ng c?n ph?i th?n tr?ng. Qu li?u: N?u qu v? cho r?ng mnh ? dng qu nhi?u thu?c ny, th hy lin l?c v?i trung tm ki?m sot ch?t ??c ho?c phng c?p c?u ngay l?p t?c. L?U : Thu?c ny ch? dnh ring cho qu v?. Khng chia s? thu?c ny v?i nh?ng ng??i khc. N?u ti l? qun m?t li?u th sao? ?i?u ny khng p d?ng. Nh?ng g c th? t??ng tc v?i thu?c ny? Thu?c ny c th? t??ng tc v?i cc thu?c sau ?y: -m?t s? thu?c dng cho ch?ng lo u ho?c cc v?n ?? v? gi?c ng? -m?t s? thu?c dng cho cc ch?ng co gi?t, ch?ng h?n nh? phenobarbital -digoxin -cc thu?c lm gin c? ?? gi?i ph?u -cc thu?c gi?m ?au gy ng? (narcotic) Dan[facility] sch ny c th? khng m t? ?? h?t cc t??ng tc c th? x?y ra. Hy ??a cho ng??i cung c?p d?ch v? y t? c?a mnh danh sch t?t c? cc thu?c, th?o d??c, cc thu?c khng c?n toa, ho?c cc ch? ph?m b? sung m qu v? dng. C?ng nn  bo cho h? bi?t r?ng qu v? c ht thu?c, u?ng r??u, ho?c c s? d?ng ma ty tri php hay khng. Vi th? c th? t??ng tc v?i thu?c c?a qu v?. Ti c?n ph?i theo di ?i?u g trong khi dng thu?c ny? Qu v? s? ???c theo di ch?t ch? trong khi dng thu?c ny. Qu v? c th? c?n ph?i ???c lm xt nghi?m mu trong th?i gian dng thu?c ny. Ti c th? nh?n th?y nh?ng tc d?ng ph? no khi dng thu?c ny? Nh?ng tc d?ng ph? qu v? c?n ph?i bo cho bc s? ho?c chuyn vin y t? cng s?m cng t?t: -cc ph?n ?ng d? ?ng, ch?ng h?n nh? da b? m?n ??, ng?a, n?i my ?ay, s?ng ? m?t, mi, ho?c l??i -ph?ng m?t -y?u c? b?p -cc d?u hi?u v tri?u ch?ng huy?t p th?p, ch?ng h?n nh? chng m?t; c?m th?y chong vng ho?c ng?t x?u, b? ng; c?m th?y y?u ?t ho?c m?t m?i khc th??ng -cc d?u hi?u v tri?u ch?ng c?a s? thay ??i nguy hi?m c?a nh?p tim, ch?ng h?n nh? ?au ng?c, chng m?t,  nh?p tim nhanh ho?c khng ??u, ?nh tr?ng ng?c; cc v?n ?? v? h h?p -v m? hi [facility] sch ny c th? khng m t? ?? h?t cc tc d?ng ph? c th? x?y ra. Xin g?i t?i bc s? c?a mnh ?? ???c c? v?n chuyn mn v? cc tc d?ng ph?Jason Reilly v? c th? t??ng trnh cc tc d?ng ph? cho FDA theo s? [phone]. Ti nn c?t gi? thu?c c?a mnh ? ?u? Thu?c ny ???c s? d?ng b?i chuyn vin y t? ? b?nh vi?n ho?c ? phng m?ch. Qu v? s? khng ???c c?p thu?c ny ?? c?t gi? t?i nh. L?U : ?y l b?n tm t?t. N c th? khng bao hm t?t c? thng tin c th? c. N?u qu v? th?c m?c v? thu?c ny, xin trao ??i v?i bc s?, d??c s?, ho?c ng??i cung c?p d?ch v? y t? c?a mnh.    2016, Elsevier/Gold Standard. (2013-06-23 00:00:00)   Hypomagnesemia Hypomagnesemia is a condition in which the level of magnesium in the blood is low. Magnesium is a mineral that is found in many foods. It is used in many different processes in the body. Hypomagnesemia can affect every organ in the body. It can cause life-threatening problems. CAUSES Causes of hypomagnesemia  include:  Not getting enough magnesium in your diet.  Malnutrition.  Problems with absorbing magnesium from the intestines.  Dehydration.  Alcohol abuse.  Vomiting.  Severe diarrhea.  Some medicines, including medicines that make you urinate more.  Certain diseases, such as kidney disease, diabetes, and overactive thyroid. SIGNS AND SYMPTOMS  Involuntary shaking or trembling of a body part (tremor).  Confusion.  Muscle weakness.  Sensitivity to light, sound, and touch.  Psychiatric issues, such as depression, irritability, or psychosis.  Sudden tightening of muscles (muscle spasms).  Tingling in the arms and legs.  A feeling of fluttering of the heart. These symptoms are more severe if magnesium levels drop suddenly. DIAGNOSIS To make a diagnosis, your health care provider will do a physical exam and order blood and urine tests. TREATMENT Treatment will depend on the cause and the severity of your condition. It may involve:  A magnesium supplement. This can be taken in pill form. It can also be given through an IV tube. This is usually done if the condition is severe.  Changes to your diet. You may be directed to eat foods that have a lot of magnesium, such as green leafy vegetables, peas, beans, and nuts.  Eliminating alcohol from your diet. HOME CARE INSTRUCTIONS  Include foods with magnesium in your diet. Foods that are rich in magnesium include green vegetables, beans, nuts and seeds, and whole grains.  Take medicines only as directed by your health care provider.  Take magnesium supplements if your health care provider instructs you to do that. Take them as directed.  Have your magnesium levels monitored as directed by your health care provider.  When you are active, drink fluids that contain electrolytes.  Keep all follow-up visits as directed by your health care provider. This is important. SEEK MEDICAL CARE IF:  You get worse instead of  better.  Your symptoms return. SEEK IMMEDIATE MEDICAL CARE IF:  Your symptoms are severe.   This information is not intended to replace advice given to you by your health care provider. Make sure you discuss any questions you have with your health care provider.   Document Released: 07/10/2005 Document Revised: 11/04/2014 Document Reviewed: 05/30/2014 Elsevier Interactive Patient Education 2016 Elsevier  Inc.  

## 2016-03-13 LAB — CEA: CEA: 221.4 ng/mL — ABNORMAL HIGH (ref 0.0–4.7)

## 2016-03-18 ENCOUNTER — Ambulatory Visit: Payer: PPO | Admitting: Hematology

## 2016-03-19 ENCOUNTER — Other Ambulatory Visit (HOSPITAL_BASED_OUTPATIENT_CLINIC_OR_DEPARTMENT_OTHER): Payer: PPO

## 2016-03-19 ENCOUNTER — Ambulatory Visit (HOSPITAL_BASED_OUTPATIENT_CLINIC_OR_DEPARTMENT_OTHER): Payer: PPO | Admitting: Nurse Practitioner

## 2016-03-19 ENCOUNTER — Telehealth: Payer: Self-pay | Admitting: *Deleted

## 2016-03-19 ENCOUNTER — Ambulatory Visit (HOSPITAL_BASED_OUTPATIENT_CLINIC_OR_DEPARTMENT_OTHER): Payer: PPO

## 2016-03-19 ENCOUNTER — Other Ambulatory Visit: Payer: Self-pay | Admitting: Nurse Practitioner

## 2016-03-19 ENCOUNTER — Ambulatory Visit: Payer: PPO

## 2016-03-19 VITALS — BP 116/63 | HR 92 | Temp 97.9°F | Resp 18 | Ht 65.0 in | Wt 144.5 lb

## 2016-03-19 DIAGNOSIS — C772 Secondary and unspecified malignant neoplasm of intra-abdominal lymph nodes: Secondary | ICD-10-CM | POA: Diagnosis not present

## 2016-03-19 DIAGNOSIS — C7800 Secondary malignant neoplasm of unspecified lung: Secondary | ICD-10-CM | POA: Diagnosis not present

## 2016-03-19 DIAGNOSIS — C787 Secondary malignant neoplasm of liver and intrahepatic bile duct: Secondary | ICD-10-CM

## 2016-03-19 DIAGNOSIS — K922 Gastrointestinal hemorrhage, unspecified: Secondary | ICD-10-CM

## 2016-03-19 DIAGNOSIS — E119 Type 2 diabetes mellitus without complications: Secondary | ICD-10-CM

## 2016-03-19 DIAGNOSIS — Z5112 Encounter for antineoplastic immunotherapy: Secondary | ICD-10-CM

## 2016-03-19 DIAGNOSIS — C187 Malignant neoplasm of sigmoid colon: Secondary | ICD-10-CM

## 2016-03-19 DIAGNOSIS — E46 Unspecified protein-calorie malnutrition: Secondary | ICD-10-CM

## 2016-03-19 DIAGNOSIS — Z5111 Encounter for antineoplastic chemotherapy: Secondary | ICD-10-CM

## 2016-03-19 DIAGNOSIS — D509 Iron deficiency anemia, unspecified: Secondary | ICD-10-CM

## 2016-03-19 DIAGNOSIS — C7972 Secondary malignant neoplasm of left adrenal gland: Secondary | ICD-10-CM

## 2016-03-19 DIAGNOSIS — I10 Essential (primary) hypertension: Secondary | ICD-10-CM

## 2016-03-19 DIAGNOSIS — D6481 Anemia due to antineoplastic chemotherapy: Secondary | ICD-10-CM

## 2016-03-19 DIAGNOSIS — Z95828 Presence of other vascular implants and grafts: Secondary | ICD-10-CM

## 2016-03-19 DIAGNOSIS — D500 Iron deficiency anemia secondary to blood loss (chronic): Secondary | ICD-10-CM

## 2016-03-19 LAB — CBC WITH DIFFERENTIAL/PLATELET
BASO%: 0.5 % (ref 0.0–2.0)
BASOS ABS: 0 10*3/uL (ref 0.0–0.1)
EOS%: 2.7 % (ref 0.0–7.0)
Eosinophils Absolute: 0.1 10*3/uL (ref 0.0–0.5)
HEMATOCRIT: 27.1 % — AB (ref 38.4–49.9)
HGB: 8.9 g/dL — ABNORMAL LOW (ref 13.0–17.1)
LYMPH%: 16.5 % (ref 14.0–49.0)
MCH: 29.4 pg (ref 27.2–33.4)
MCHC: 32.8 g/dL (ref 32.0–36.0)
MCV: 89.6 fL (ref 79.3–98.0)
MONO#: 0.4 10*3/uL (ref 0.1–0.9)
MONO%: 14.5 % — AB (ref 0.0–14.0)
NEUT#: 1.9 10*3/uL (ref 1.5–6.5)
NEUT%: 65.8 % (ref 39.0–75.0)
Platelets: 122 10*3/uL — ABNORMAL LOW (ref 140–400)
RBC: 3.03 10*6/uL — AB (ref 4.20–5.82)
RDW: 17.1 % — ABNORMAL HIGH (ref 11.0–14.6)
WBC: 2.8 10*3/uL — ABNORMAL LOW (ref 4.0–10.3)
lymph#: 0.5 10*3/uL — ABNORMAL LOW (ref 0.9–3.3)

## 2016-03-19 LAB — COMPREHENSIVE METABOLIC PANEL
ALT: 24 U/L (ref 0–55)
AST: 24 U/L (ref 5–34)
Albumin: 3.2 g/dL — ABNORMAL LOW (ref 3.5–5.0)
Alkaline Phosphatase: 171 U/L — ABNORMAL HIGH (ref 40–150)
Anion Gap: 8 mEq/L (ref 3–11)
BUN: 13.1 mg/dL (ref 7.0–26.0)
CALCIUM: 8.5 mg/dL (ref 8.4–10.4)
CHLORIDE: 107 meq/L (ref 98–109)
CO2: 23 meq/L (ref 22–29)
Creatinine: 0.9 mg/dL (ref 0.7–1.3)
EGFR: 90 mL/min/{1.73_m2} — ABNORMAL LOW (ref >90)
Glucose: 270 mg/dl — ABNORMAL HIGH (ref 70–140)
POTASSIUM: 4.1 meq/L (ref 3.5–5.1)
SODIUM: 138 meq/L (ref 136–145)
Total Bilirubin: 0.6 mg/dL (ref 0.20–1.20)
Total Protein: 6.6 g/dL (ref 6.4–8.3)

## 2016-03-19 LAB — MAGNESIUM: MAGNESIUM: 0.8 mg/dL — AB (ref 1.5–2.5)

## 2016-03-19 MED ORDER — SODIUM CHLORIDE 0.9 % IV SOLN
Freq: Once | INTRAVENOUS | Status: AC
  Administered 2016-03-19: 10:00:00 via INTRAVENOUS
  Filled 2016-03-19: qty 4

## 2016-03-19 MED ORDER — SODIUM CHLORIDE 0.9 % IV SOLN: Freq: Once | INTRAVENOUS | Status: DC

## 2016-03-19 MED ORDER — SODIUM CHLORIDE 0.9 % IV SOLN
141.3000 mg/m2 | Freq: Once | INTRAVENOUS | Status: AC
  Administered 2016-03-19: 240 mg via INTRAVENOUS
  Filled 2016-03-19: qty 10

## 2016-03-19 MED ORDER — HEPARIN SOD (PORK) LOCK FLUSH 100 UNIT/ML IV SOLN
500.0000 [IU] | Freq: Once | INTRAVENOUS | Status: AC | PRN
  Administered 2016-03-19: 500 [IU]
  Filled 2016-03-19: qty 5

## 2016-03-19 MED ORDER — METFORMIN HCL 500 MG PO TABS: 500.0000 mg | ORAL_TABLET | Freq: Two times a day (BID) | ORAL | Status: AC

## 2016-03-19 MED ORDER — SODIUM CHLORIDE 0.9 % IV SOLN
6.1000 mg/kg | Freq: Once | INTRAVENOUS | Status: AC
  Administered 2016-03-19: 400 mg via INTRAVENOUS
  Filled 2016-03-19: qty 20

## 2016-03-19 MED ORDER — SODIUM CHLORIDE 0.9 % IJ SOLN
10.0000 mL | INTRAMUSCULAR | Status: DC | PRN
  Administered 2016-03-19: 10 mL
  Filled 2016-03-19: qty 10

## 2016-03-19 MED ORDER — SODIUM CHLORIDE 0.9% FLUSH
10.0000 mL | INTRAVENOUS | Status: DC | PRN
  Administered 2016-03-19: 10 mL via INTRAVENOUS
  Filled 2016-03-19: qty 10

## 2016-03-19 MED ORDER — ATROPINE SULFATE 1 MG/ML IJ SOLN
0.5000 mg | Freq: Once | INTRAMUSCULAR | Status: AC | PRN
  Administered 2016-03-19: 0.5 mg via INTRAVENOUS

## 2016-03-19 MED ORDER — SODIUM CHLORIDE 0.9 % IV SOLN
Freq: Once | INTRAVENOUS | Status: AC
  Administered 2016-03-19: 12:00:00 via INTRAVENOUS

## 2016-03-19 MED ORDER — SODIUM CHLORIDE 0.9 % IV SOLN
Freq: Once | INTRAVENOUS | Status: AC
  Administered 2016-03-19: 10:00:00 via INTRAVENOUS

## 2016-03-19 MED ORDER — ATROPINE SULFATE 1 MG/ML IJ SOLN
INTRAMUSCULAR | Status: AC
  Filled 2016-03-19: qty 1

## 2016-03-19 NOTE — Telephone Encounter (Signed)
Per desk RN I have scheduled appt for ivf tomorrow

## 2016-03-19 NOTE — Progress Notes (Signed)
Okay to treat with magnesium level of 1.1 7 days ago per Ned Card, NP.

## 2016-03-19 NOTE — Progress Notes (Signed)
Called and informed lab to add on magenisum, Ned Card, NP put in orders. Informed Hassan Rowan, RN in infusion chemo is okay to proceed, depending on lab results patient might need IV Mag after chemo infusion.

## 2016-03-19 NOTE — Progress Notes (Signed)
[facility] OFFICE PROGRESS NOTE   Diagnosis:  Metastatic colon cancer Oncology History   Metastatic colon cancer to liver  Staging form: Colon and Rectum, AJCC 7th Edition  Clinical: Stage Unknown ([place], NX, M1) - Unsigned      Cancer of sigmoid colon ([place])   10/28/2014 Tumor Marker AFP 3.2 CEA > 10,000 CA 19-9 12,929.6. tumor (-) KRAS and NRAS mutation.    11/10/2014 Imaging PET scan showed hypermetabolic mass in the sigmoid colon was noted metastasis in the retroperitoneum. Probable left adrenal and pulmonary metastasis, and diffuse liver metastasis.   11/21/2014 Initial Diagnosis Metastatic colon cancer to liver, lung, abd nodes and left adrenal gland. Diagnosis was made by liver biopsy.    11/30/2014 -  Chemotherapy First line chemo [id number], Panitumumab added from second cycle    12/28/2014 - 12/31/2014 Hospital Admission Was admitted for dehydration, neutropenia fever with UTI, and severe skin rash.   02/08/2015 - 02/12/2015 Hospital Admission He was admitted for upper GI bleeding, e.g. showed a gastric ulcer with clots, status post appendectomy injection. He also received a blood transfusion.   03/01/2015 Tumor Marker CEA 694, CA19.9 462   03/13/2015 Imaging CT CAP showed partial response, no new lesions.    03/15/2015 - 05/23/2015 Chemotherapy restart FOLFOX, held again on 8/9 due to GI bleeding    05/31/2015 - 06/02/2015 Hospital Admission He was admitted to [facility] in [place] due to upper GI bleeding, EGD showed gastric and dudenal ulcers    06/27/2015 - 06/29/2015 Hospital Admission he was admitted for dairrhea and pancolitis, c-diff and stool cultures were negative, treated with antibiotics   07/12/2015 -  Chemotherapy panitumumab 10m/kg, every 2 weeks   11/08/2015 - 11/10/2015 Hospital Admission Pt was admitted for sepsis and hypotension, was found to have (+) influenza A and treated.    11/30/2015 Imaging CT scans  reviewed continued improvement in the liver metastasis, no other new lesions.        INTERVAL HISTORY:   Mr. AMunyonreturns as scheduled. He completed another cycle of irinotecan/PANITUMUMAB 03/04/2016.He denies nausea/vomiting. No mouth sores. No diarrhea. Skin rash is unchanged. Appetite is better. He denies pain. No bleeding. No fever, cough or shortness of breath.  Objective:  Vital signs in last 24 hours:  Blood pressure 116/63, pulse 92, temperature 97.9 F (36.6 C), temperature source Oral, resp. rate 18, height '5\' 5"'  (1.651 m), weight 144 lb 8 oz (65.545 kg), SpO2 100 %.    HEENT: No thrush or ulcers. Resp: Faint rales at both lung bases. Cardio: Regular rate and rhythm. GI: Abdomen soft and nontender. No hepatomegaly. Vascular: No leg edema. Skin: Acne-like skin rash scattered over the trunk, face. Port-A-Cath without erythema.    Lab Results:  Lab Results  Component Value Date   WBC 2.8* 03/19/2016   HGB 8.9* 03/19/2016   HCT 27.1* 03/19/2016   MCV 89.6 03/19/2016   PLT 122* 03/19/2016   NEUTROABS 1.9 03/19/2016    Imaging:  No results found.  Medications: I have reviewed the patient's current medications.  Assessment/Plan: 1. Metastatic sigmoid colon cancer, with diffuse liver, lungs, node and left adrenal gland metastases. KRAS/NRAS wild type, MSI-stable. Initially treated with FOLFOX. Currently on active treatment with irinotecan/PANITUMUMAB. Restaging CT evaluation 02/29/2016 showed ongoing response. 2. Grade 1-2 skin rash secondary to PANITUMUMAB. 3. Gastric ulcer with significant GI bleeding April and August 2016 repeat EGD 08/15/2015 showed near complete healing of the gastric ulcer. He continues PPI. 4. Type 2 diabetes. 5.  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 6. 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 Malnutrition 8. Anemia secondary to GI bleeding, iron deficiency and  chemotherapy. 9. Hypomagnesemia secondary to PANITUMUMAB. He receives IV magnesium 6 g every 1-2 weeks.   Disposition: Mr. Dorko appears well. Plan to continue irinotecan/PANITUMUMAB every 2 weeks. He will return for a follow-up visit 04/02/2016. He will contact the office in the interim with any problems.  Plan reviewed with [name].    Ned Card ANP/GNP-BC   03/19/2016  9:44 AM

## 2016-03-19 NOTE — Patient Instructions (Signed)
[facility] Discharge Instructions for Patients Receiving Chemotherapy  Today you received the following chemotherapy agents: Vectibix & Irinotican.  To help prevent nausea and vomiting after your treatment, we encourage you to take your nausea medication as prescribed.   If you develop nausea and vomiting that is not controlled by your nausea medication, call the clinic.   BELOW ARE SYMPTOMS THAT SHOULD BE REPORTED IMMEDIATELY:  *FEVER GREATER THAN 100.5 F  *CHILLS WITH OR WITHOUT FEVER  NAUSEA AND VOMITING THAT IS NOT CONTROLLED WITH YOUR NAUSEA MEDICATION  *UNUSUAL SHORTNESS OF BREATH  *UNUSUAL BRUISING OR BLEEDING  TENDERNESS IN MOUTH AND THROAT WITH OR WITHOUT PRESENCE OF ULCERS  *URINARY PROBLEMS  *BOWEL PROBLEMS  UNUSUAL RASH Items with * indicate a potential emergency and should be followed up as soon as possible.  Feel free to call the clinic you have any questions or concerns. The clinic phone number is [phone].  Please show the [facility] at check-in to the Emergency Department and triage nurse.  Hypomagnesemia Hypomagnesemia is a condition in which the level of magnesium in the blood is low. Magnesium is a mineral that is found in many foods. It is used in many different processes in the body. Hypomagnesemia can affect every organ in the body. It can cause life-threatening problems. CAUSES Causes of hypomagnesemia include:  Not getting enough magnesium in your diet.  Malnutrition.  Problems with absorbing magnesium from the intestines.  Dehydration.  Alcohol abuse.  Vomiting.  Severe diarrhea.  Some medicines, including medicines that make you urinate more.  Certain diseases, such as kidney disease, diabetes, and overactive thyroid. SIGNS AND SYMPTOMS  Involuntary shaking or trembling of a body part (tremor).  Confusion.  Muscle weakness.  Sensitivity to light, sound, and touch.  Psychiatric issues, such as  depression, irritability, or psychosis.  Sudden tightening of muscles (muscle spasms).  Tingling in the arms and legs.  A feeling of fluttering of the heart. These symptoms are more severe if magnesium levels drop suddenly. DIAGNOSIS To make a diagnosis, your health care provider will do a physical exam and order blood and urine tests. TREATMENT Treatment will depend on the cause and the severity of your condition. It may involve:  A magnesium supplement. This can be taken in pill form. It can also be given through an IV tube. This is usually done if the condition is severe.  Changes to your diet. You may be directed to eat foods that have a lot of magnesium, such as green leafy vegetables, peas, beans, and nuts.  Eliminating alcohol from your diet. HOME CARE INSTRUCTIONS  Include foods with magnesium in your diet. Foods that are rich in magnesium include green vegetables, beans, nuts and seeds, and whole grains.  Take medicines only as directed by your health care provider.  Take magnesium supplements if your health care provider instructs you to do that. Take them as directed.  Have your magnesium levels monitored as directed by your health care provider.  When you are active, drink fluids that contain electrolytes.  Keep all follow-up visits as directed by your health care provider. This is important. SEEK MEDICAL CARE IF:  You get worse instead of better.  Your symptoms return. SEEK IMMEDIATE MEDICAL CARE IF:  Your symptoms are severe.   This information is not intended to replace advice given to you by your health care provider. Make sure you discuss any questions you have with your health care provider.   Document Released:  07/10/2005 Document Revised: 11/04/2014 Document Reviewed: 05/30/2014 Elsevier Interactive Patient Education [employer].

## 2016-03-19 NOTE — Patient Instructions (Signed)

## 2016-03-20 ENCOUNTER — Ambulatory Visit (HOSPITAL_BASED_OUTPATIENT_CLINIC_OR_DEPARTMENT_OTHER): Payer: PPO

## 2016-03-20 VITALS — BP 113/97 | HR 104 | Temp 98.6°F | Resp 18

## 2016-03-20 DIAGNOSIS — Z95828 Presence of other vascular implants and grafts: Secondary | ICD-10-CM

## 2016-03-20 DIAGNOSIS — D509 Iron deficiency anemia, unspecified: Secondary | ICD-10-CM

## 2016-03-20 MED ORDER — SODIUM CHLORIDE 0.9 % IJ SOLN
10.0000 mL | INTRAMUSCULAR | Status: DC | PRN
  Administered 2016-03-20: 10 mL via INTRAVENOUS
  Filled 2016-03-20: qty 10

## 2016-03-20 MED ORDER — SODIUM CHLORIDE 0.9 % IV SOLN
Freq: Once | INTRAVENOUS | Status: AC
  Administered 2016-03-20: 09:00:00 via INTRAVENOUS
  Filled 2016-03-20: qty 250

## 2016-03-20 MED ORDER — HEPARIN SOD (PORK) LOCK FLUSH 100 UNIT/ML IV SOLN
500.0000 [IU] | Freq: Once | INTRAVENOUS | Status: AC | PRN
  Administered 2016-03-20: 500 [IU] via INTRAVENOUS
  Filled 2016-03-20: qty 5

## 2016-03-20 NOTE — Patient Instructions (Signed)
Hypomagnesemia °Hypomagnesemia is a condition in which the level of magnesium in the blood is low. Magnesium is a mineral that is found in many foods. It is used in many different processes in the body. Hypomagnesemia can affect every organ in the body. It can cause life-threatening problems. °CAUSES °Causes of hypomagnesemia include: °· Not getting enough magnesium in your diet. °· Malnutrition. °· Problems with absorbing magnesium from the intestines. °· Dehydration. °· Alcohol abuse. °· Vomiting. °· Severe diarrhea. °· Some medicines, including medicines that make you urinate more. °· Certain diseases, such as kidney disease, diabetes, and overactive thyroid. °SIGNS AND SYMPTOMS °· Involuntary shaking or trembling of a body part (tremor). °· Confusion. °· Muscle weakness. °· Sensitivity to light, sound, and touch. °· Psychiatric issues, such as depression, irritability, or psychosis. °· Sudden tightening of muscles (muscle spasms). °· Tingling in the arms and legs. °· A feeling of fluttering of the heart. °These symptoms are more severe if magnesium levels drop suddenly. °DIAGNOSIS °To make a diagnosis, your health care provider will do a physical exam and order blood and urine tests. °TREATMENT °Treatment will depend on the cause and the severity of your condition. It may involve: °· A magnesium supplement. This can be taken in pill form. It can also be given through an IV tube. This is usually done if the condition is severe. °· Changes to your diet. You may be directed to eat foods that have a lot of magnesium, such as green leafy vegetables, peas, beans, and nuts. °· Eliminating alcohol from your diet. °HOME CARE INSTRUCTIONS °· Include foods with magnesium in your diet. Foods that are rich in magnesium include green vegetables, beans, nuts and seeds, and whole grains. °· Take medicines only as directed by your health care provider. °· Take magnesium supplements if your health care provider instructs you to  do that. Take them as directed. °· Have your magnesium levels monitored as directed by your health care provider. °· When you are active, drink fluids that contain electrolytes. °· Keep all follow-up visits as directed by your health care provider. This is important. °SEEK MEDICAL CARE IF: °· You get worse instead of better. °· Your symptoms return. °SEEK IMMEDIATE MEDICAL CARE IF: °· Your symptoms are severe. °  °This information is not intended to replace advice given to you by your health care provider. Make sure you discuss any questions you have with your health care provider. °  °Document Released: 07/10/2005 Document Revised: 11/04/2014 Document Reviewed: 05/30/2014 °Elsevier Interactive Patient Education ©2016 [employer] ° °

## 2016-03-26 ENCOUNTER — Ambulatory Visit: Payer: PPO | Admitting: Hematology

## 2016-03-26 ENCOUNTER — Other Ambulatory Visit: Payer: Self-pay | Admitting: Hematology

## 2016-03-26 ENCOUNTER — Other Ambulatory Visit (HOSPITAL_BASED_OUTPATIENT_CLINIC_OR_DEPARTMENT_OTHER): Payer: PPO

## 2016-03-26 ENCOUNTER — Ambulatory Visit (HOSPITAL_COMMUNITY)
Admission: RE | Admit: 2016-03-26 | Discharge: 2016-03-26 | Disposition: A | Discharge status: Home / Self Care | Payer: PPO | Source: Ambulatory Visit | Attending: Hematology | Admitting: Hematology

## 2016-03-26 DIAGNOSIS — C187 Malignant neoplasm of sigmoid colon: Secondary | ICD-10-CM

## 2016-03-26 LAB — CBC WITH DIFFERENTIAL/PLATELET
BASO%: 0.6 % (ref 0.0–2.0)
Basophils Absolute: 0 10*3/uL (ref 0.0–0.1)
EOS%: 3.3 % (ref 0.0–7.0)
Eosinophils Absolute: 0.1 10*3/uL (ref 0.0–0.5)
HCT: 28.6 % — ABNORMAL LOW (ref 38.4–49.9)
HEMOGLOBIN: 9.5 g/dL — AB (ref 13.0–17.1)
LYMPH#: 0.4 10*3/uL — AB (ref 0.9–3.3)
LYMPH%: 16.9 % (ref 14.0–49.0)
MCH: 29.3 pg (ref 27.2–33.4)
MCHC: 33.3 g/dL (ref 32.0–36.0)
MCV: 88 fL (ref 79.3–98.0)
MONO#: 0.3 10*3/uL (ref 0.1–0.9)
MONO%: 12.9 % (ref 0.0–14.0)
NEUT%: 66.3 % (ref 39.0–75.0)
NEUTROS ABS: 1.6 10*3/uL (ref 1.5–6.5)
PLATELETS: 138 10*3/uL — AB (ref 140–400)
RBC: 3.25 10*6/uL — AB (ref 4.20–5.82)
RDW: 16.5 % — AB (ref 11.0–14.6)
WBC: 2.4 10*3/uL — AB (ref 4.0–10.3)

## 2016-03-26 LAB — FERRITIN

## 2016-03-26 MED ORDER — SODIUM CHLORIDE 0.9% FLUSH
10.0000 mL | INTRAVENOUS | Status: AC | PRN
  Administered 2016-03-26: 10 mL

## 2016-03-26 MED ORDER — HEPARIN SOD (PORK) LOCK FLUSH 100 UNIT/ML IV SOLN
500.0000 [IU] | INTRAVENOUS | Status: AC | PRN
  Administered 2016-03-26: 500 [IU]
  Filled 2016-03-26: qty 5

## 2016-03-26 MED ORDER — SODIUM CHLORIDE 0.9 % IV SOLN
6.0000 g | Freq: Once | INTRAVENOUS | Status: AC
  Administered 2016-03-26: 6 g via INTRAVENOUS
  Filled 2016-03-26: qty 10

## 2016-03-26 NOTE — Discharge Instructions (Signed)
Today, you received an IV infusion of Magnesium. Please contact your primary physician with questions or concerns.   Hypomagnesemia Hypomagnesemia is a condition in which the level of magnesium in the blood is low. Magnesium is a mineral that is found in many foods. It is used in many different processes in the body. Hypomagnesemia can affect every organ in the body. It can cause life-threatening problems. CAUSES Causes of hypomagnesemia include:  Not getting enough magnesium in your diet.  Malnutrition.  Problems with absorbing magnesium from the intestines.  Dehydration.  Alcohol abuse.  Vomiting.  Severe diarrhea.  Some medicines, including medicines that make you urinate more.  Certain diseases, such as kidney disease, diabetes, and overactive thyroid. SIGNS AND SYMPTOMS  Involuntary shaking or trembling of a body part (tremor).  Confusion.  Muscle weakness.  Sensitivity to light, sound, and touch.  Psychiatric issues, such as depression, irritability, or psychosis.  Sudden tightening of muscles (muscle spasms).  Tingling in the arms and legs.  A feeling of fluttering of the heart. These symptoms are more severe if magnesium levels drop suddenly. DIAGNOSIS To make a diagnosis, your health care provider will do a physical exam and order blood and urine tests. TREATMENT Treatment will depend on the cause and the severity of your condition. It may involve:  A magnesium supplement. This can be taken in pill form. It can also be given through an IV tube. This is usually done if the condition is severe.  Changes to your diet. You may be directed to eat foods that have a lot of magnesium, such as green leafy vegetables, peas, beans, and nuts.  Eliminating alcohol from your diet. HOME CARE INSTRUCTIONS  Include foods with magnesium in your diet. Foods that are rich in magnesium include green vegetables, beans, nuts and seeds, and whole grains.  Take medicines only  as directed by your health care provider.  Take magnesium supplements if your health care provider instructs you to do that. Take them as directed.  Have your magnesium levels monitored as directed by your health care provider.  When you are active, drink fluids that contain electrolytes.  Keep all follow-up visits as directed by your health care provider. This is important. SEEK MEDICAL CARE IF:  You get worse instead of better.  Your symptoms return. SEEK IMMEDIATE MEDICAL CARE IF:  Your symptoms are severe.   This information is not intended to replace advice given to you by your health care provider. Make sure you discuss any questions you have with your health care provider.   Document Released: 07/10/2005 Document Revised: 11/04/2014 Document Reviewed: 05/30/2014 Elsevier Interactive Patient Education [employer].

## 2016-03-26 NOTE — Progress Notes (Signed)
Patient ID: Jason Reilly, male   DOB: 06/10/1946, 70 y.o.   MRN: [id number] Pt of DR.Feng with diagnosis of cancer of sigmoid colon arrived to [facility] for infusion of magnesium. Port-a-cath was accessed without any difficulty, blood return noted, site is intact. Medication infused for three hr. Pt tolerated procedure well. Ambulatory upon completion. Pt discharged to home and taken to main entrance where he was picked up by his family.  Tayloranne Lekas, Eustaquio Maize

## 2016-04-02 ENCOUNTER — Telehealth: Payer: Self-pay | Admitting: *Deleted

## 2016-04-02 ENCOUNTER — Other Ambulatory Visit (HOSPITAL_BASED_OUTPATIENT_CLINIC_OR_DEPARTMENT_OTHER): Payer: PPO

## 2016-04-02 ENCOUNTER — Ambulatory Visit (HOSPITAL_BASED_OUTPATIENT_CLINIC_OR_DEPARTMENT_OTHER): Payer: PPO

## 2016-04-02 ENCOUNTER — Ambulatory Visit (HOSPITAL_BASED_OUTPATIENT_CLINIC_OR_DEPARTMENT_OTHER): Payer: PPO | Admitting: Hematology

## 2016-04-02 ENCOUNTER — Telehealth: Payer: Self-pay | Admitting: Hematology

## 2016-04-02 ENCOUNTER — Encounter: Payer: Self-pay | Admitting: Hematology

## 2016-04-02 VITALS — BP 124/68 | HR 86 | Temp 98.5°F | Resp 18 | Ht 65.0 in | Wt 143.7 lb

## 2016-04-02 DIAGNOSIS — I5042 Chronic combined systolic (congestive) and diastolic (congestive) heart failure: Secondary | ICD-10-CM

## 2016-04-02 DIAGNOSIS — C787 Secondary malignant neoplasm of liver and intrahepatic bile duct: Secondary | ICD-10-CM | POA: Diagnosis not present

## 2016-04-02 DIAGNOSIS — Z5111 Encounter for antineoplastic chemotherapy: Secondary | ICD-10-CM

## 2016-04-02 DIAGNOSIS — C7800 Secondary malignant neoplasm of unspecified lung: Secondary | ICD-10-CM

## 2016-04-02 DIAGNOSIS — E46 Unspecified protein-calorie malnutrition: Secondary | ICD-10-CM

## 2016-04-02 DIAGNOSIS — C187 Malignant neoplasm of sigmoid colon: Secondary | ICD-10-CM | POA: Diagnosis not present

## 2016-04-02 DIAGNOSIS — E119 Type 2 diabetes mellitus without complications: Secondary | ICD-10-CM

## 2016-04-02 DIAGNOSIS — C772 Secondary and unspecified malignant neoplasm of intra-abdominal lymph nodes: Secondary | ICD-10-CM | POA: Diagnosis not present

## 2016-04-02 DIAGNOSIS — C7972 Secondary malignant neoplasm of left adrenal gland: Secondary | ICD-10-CM

## 2016-04-02 DIAGNOSIS — D509 Iron deficiency anemia, unspecified: Secondary | ICD-10-CM

## 2016-04-02 DIAGNOSIS — Z5112 Encounter for antineoplastic immunotherapy: Secondary | ICD-10-CM | POA: Diagnosis not present

## 2016-04-02 DIAGNOSIS — R21 Rash and other nonspecific skin eruption: Secondary | ICD-10-CM

## 2016-04-02 DIAGNOSIS — D500 Iron deficiency anemia secondary to blood loss (chronic): Secondary | ICD-10-CM

## 2016-04-02 DIAGNOSIS — I10 Essential (primary) hypertension: Secondary | ICD-10-CM

## 2016-04-02 DIAGNOSIS — K254 Chronic or unspecified gastric ulcer with hemorrhage: Secondary | ICD-10-CM

## 2016-04-02 DIAGNOSIS — D6481 Anemia due to antineoplastic chemotherapy: Secondary | ICD-10-CM

## 2016-04-02 LAB — CBC WITH DIFFERENTIAL/PLATELET
BASO%: 0.6 % (ref 0.0–2.0)
Basophils Absolute: 0 10*3/uL (ref 0.0–0.1)
EOS ABS: 0.1 10*3/uL (ref 0.0–0.5)
EOS%: 2.1 % (ref 0.0–7.0)
HEMATOCRIT: 26.8 % — AB (ref 38.4–49.9)
HEMOGLOBIN: 8.9 g/dL — AB (ref 13.0–17.1)
LYMPH%: 18.5 % (ref 14.0–49.0)
MCH: 29.2 pg (ref 27.2–33.4)
MCHC: 33.2 g/dL (ref 32.0–36.0)
MCV: 87.9 fL (ref 79.3–98.0)
MONO#: 0.5 10*3/uL (ref 0.1–0.9)
MONO%: 9.6 % (ref 0.0–14.0)
NEUT%: 69.2 % (ref 39.0–75.0)
NEUTROS ABS: 3.3 10*3/uL (ref 1.5–6.5)
Platelets: 167 10*3/uL (ref 140–400)
RBC: 3.05 10*6/uL — AB (ref 4.20–5.82)
RDW: 15.7 % — ABNORMAL HIGH (ref 11.0–14.6)
WBC: 4.7 10*3/uL (ref 4.0–10.3)
lymph#: 0.9 10*3/uL (ref 0.9–3.3)

## 2016-04-02 LAB — COMPREHENSIVE METABOLIC PANEL
ALBUMIN: 3.5 g/dL (ref 3.5–5.0)
ALK PHOS: 190 U/L — AB (ref 40–150)
ALT: 22 U/L (ref 0–55)
ANION GAP: 8 meq/L (ref 3–11)
AST: 30 U/L (ref 5–34)
BILIRUBIN TOTAL: 0.48 mg/dL (ref 0.20–1.20)
BUN: 9.4 mg/dL (ref 7.0–26.0)
CALCIUM: 8.7 mg/dL (ref 8.4–10.4)
CO2: 27 mEq/L (ref 22–29)
Chloride: 107 mEq/L (ref 98–109)
Creatinine: 0.9 mg/dL (ref 0.7–1.3)
EGFR: 85 mL/min/{1.73_m2} — AB (ref >90)
GLUCOSE: 118 mg/dL (ref 70–140)
Potassium: 4.2 mEq/L (ref 3.5–5.1)
SODIUM: 142 meq/L (ref 136–145)
TOTAL PROTEIN: 7.3 g/dL (ref 6.4–8.3)

## 2016-04-02 MED ORDER — SODIUM CHLORIDE 0.9 % IV SOLN
Freq: Once | INTRAVENOUS | Status: AC
  Administered 2016-04-02: 10:00:00 via INTRAVENOUS

## 2016-04-02 MED ORDER — IRINOTECAN HCL CHEMO INJECTION 100 MG/5ML: 141.0000 mg/m2 | Freq: Once | INTRAVENOUS | Status: DC

## 2016-04-02 MED ORDER — SODIUM CHLORIDE 0.9 % IV SOLN
Freq: Once | INTRAVENOUS | Status: AC
  Administered 2016-04-02: 11:00:00 via INTRAVENOUS
  Filled 2016-04-02: qty 250

## 2016-04-02 MED ORDER — SODIUM CHLORIDE 0.9 % IJ SOLN
10.0000 mL | INTRAMUSCULAR | Status: DC | PRN
Start: 2016-04-02 — End: 2016-04-02
  Administered 2016-04-02: 10 mL
  Filled 2016-04-02: qty 10

## 2016-04-02 MED ORDER — DEXAMETHASONE SODIUM PHOSPHATE 100 MG/10ML IJ SOLN
Freq: Once | INTRAMUSCULAR | Status: AC
  Administered 2016-04-02: 10:00:00 via INTRAVENOUS
  Filled 2016-04-02: qty 4

## 2016-04-02 MED ORDER — SODIUM CHLORIDE 0.9 % IV SOLN
6.2000 mg/kg | Freq: Once | INTRAVENOUS | Status: AC
  Administered 2016-04-02: 400 mg via INTRAVENOUS
  Filled 2016-04-02: qty 20

## 2016-04-02 MED ORDER — ATROPINE SULFATE 1 MG/ML IJ SOLN
INTRAMUSCULAR | Status: AC
  Filled 2016-04-02: qty 1

## 2016-04-02 MED ORDER — ATROPINE SULFATE 1 MG/ML IJ SOLN
0.5000 mg | Freq: Once | INTRAMUSCULAR | Status: AC | PRN
  Administered 2016-04-02: 0.5 mg via INTRAVENOUS

## 2016-04-02 MED ORDER — SODIUM CHLORIDE 0.9 % IV SOLN
141.0000 mg/m2 | Freq: Once | INTRAVENOUS | Status: AC
  Administered 2016-04-02: 240 mg via INTRAVENOUS
  Filled 2016-04-02: qty 12

## 2016-04-02 MED ORDER — HEPARIN SOD (PORK) LOCK FLUSH 100 UNIT/ML IV SOLN
500.0000 [IU] | Freq: Once | INTRAVENOUS | Status: AC | PRN
  Administered 2016-04-02: 500 [IU]
  Filled 2016-04-02: qty 5

## 2016-04-02 NOTE — Telephone Encounter (Signed)
Per staff message and POF I have scheduled appts. Advised scheduler of appts. JMW  

## 2016-04-02 NOTE — Telephone Encounter (Signed)
per pof to sch pt appt-sent MW email to sch trmt-pt to gt updated copy b4 leaving trmt

## 2016-04-02 NOTE — Progress Notes (Signed)
Jason Reilly  [phone] [phone]  Clinic follow up Note   Patient Care Team: [name], MD as PCP - General (Specialist) [name], CRNP as Nurse Practitioner (Nurse Practitioner) Truitt Merle, MD as Consulting Physician (Hematology) Ladene Artist, MD as Consulting Physician (Gastroenterology) 04/02/2016  CHIEF COMPLAINTS Follow up metastatic sigmoid colon cancer  Oncology History   He is a Metastatic colon cancer to liver   Staging form: Colon and Rectum, AJCC 7th Edition     Clinical: Stage Unknown ([place], NX, M1) - Unsigned      Cancer of sigmoid colon ([place])   10/28/2014 Tumor Marker AFP 3.2 CEA > 10,000 CA 19-9 12,929.6. tumor (-) KRAS and NRAS mutation.    11/10/2014 Imaging PET scan showed hypermetabolic mass in the sigmoid colon was noted metastasis in the retroperitoneum. Probable left adrenal and pulmonary metastasis, and diffuse liver metastasis.   11/21/2014 Initial Diagnosis Metastatic colon cancer to liver, lung, abd nodes and left adrenal gland. Diagnosis was made by liver biopsy.    11/30/2014 -  Chemotherapy First line chemo [id number], Panitumumab added from second cycle    12/28/2014 - 12/31/2014 Hospital Admission Was admitted for dehydration, neutropenia fever with UTI, and severe skin rash.   02/08/2015 - 02/12/2015 Hospital Admission He was admitted for upper GI bleeding, e.g. showed a gastric ulcer with clots, status post appendectomy injection. He also received a blood transfusion.   03/01/2015 Tumor Marker CEA 694, CA19.9 462   03/13/2015 Imaging CT CAP showed partial response, no new lesions.    03/15/2015 - 05/23/2015 Chemotherapy restart FOLFOX, held again on 8/9 due to GI bleeding    05/31/2015 - 06/02/2015 Hospital Admission He was admitted to [facility] in [place] due to upper GI bleeding, EGD showed gastric and dudenal ulcers    06/27/2015 - 06/29/2015 Hospital Admission he was admitted for dairrhea and pancolitis, c-diff and  stool cultures were negative, treated with antibiotics   07/12/2015 -  Chemotherapy panitumumab 20m/kg, every 2 weeks   11/08/2015 - 11/10/2015 Hospital Admission Pt was admitted for sepsis and hypotension, was found to have (+) influenza A and treated.    11/30/2015 Imaging  CT scans reviewed continued improvement in the liver metastasis, no other new lesions.     HISTORY OF PRESENTING ILLNESS:  PRageAdup 70y.o. male is here because of abnormal CT findings, which is very suspicious for malignancy. [place] He came in with his son and an interpreter.  He has been feeling fatigued since two month ago. He is still able to do all ADLs. He otherwise denies any pain, bloating or nausea.  He lost about 20lbs in 3 month. His appetite is lower than before, eats less, no change of his bowl habits.  She denied any hematochezia or melana. Per his son, he has had some personality changes daily, irritable, slightly confused some time.  He was evaluated by his primary care physician. Lab test reviewed hepatitis B infection, which he did not know before, and elevated alkaline phosphatase, his liver function and the rest of the liver function was not remarkable. [place] was obtained on 07/22/2014, which showed diffusely abnormal liver with multiple echogenic lesions. CT of abdomen with and without contrast was done on 08/26/2014, which reviewed here at a medically with multiple large partially calcified hepatic masses consistent with metastatic disease. Mild retroperitoneal adenopathy with the largest node measuring 1.6  cm. And nonspecific 1.4 cm left adrenal nodule was also noticed. His tumor marker showed CEA greater than 10,000, CA 19-9 12,929, AFP 3.2 (normal). He was referred to [facility] [facility] liver clinic and was evaluated by nurse practitioner [name]. Treatment for hepatitis B was not recommended based on his virus load.  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 He was evaluated by a cardiologist in 2014. He denies any significant dyspnea on exertion. No leg swollen.  CURRENT THERAPY: panitumumab 53m/kg, every 2 weeks, started on 07/12/2015, Irinotecan 18109mm2 every 2 weeks added on 09/19/2015, dose decreased to 16066m2 from cycle 2, and further decreased to 125m34m from cycle 10 due to diarrhea and anorexia, changed to 140mg57mfrom cycle 13.  He also receives IV magnesium 6g weekly   INTERIM HISTORY: Radley Samwiserns for follow-up and chemotherapy. He is accompanied by his son and interpreter to the clinic today. He is doing well overall, has mild skin rash on the face and the neck, not itching, he uses topical creams once a day. No significant nausea, diarrhea, or other side effects from chemotherapy. He has decent appetite and energy level, able to take care of herself at home and works half miles a day.  MEDICAL HISTORY:  Past Medical History  Diagnosis Date  . Tachycardia   . Abnormal EKG   . Hypertension   . Non-ischemic cardiomyopathy (HCC) [place] Hepatitis B   . H. pylori infection   . Gastric ulcer   . Metastatic colon cancer to liver (HCC) [place] Diabetes mellitus without complication (HCC) [place] metformin    SURGICAL HISTORY: Past Surgical History  Procedure Laterality Date  . Nuclear stress test  03/03/2013    High risk - consistent with nonischemic cardiomyopathy  . Left and right heart catheterization with coronary angiogram N/A 03/29/2013    Procedure: LEFT AND RIGHT HEART CATHETERIZATION WITH CORONARY ANGIOGRAM;  Surgeon: KennePixie Casino  [facility];  Service: Cardiovascular;  Laterality: N/A;  . Esophagogastroduodenoscopy N/A 02/08/2015    Procedure: ESOPHAGOGASTRODUODENOSCOPY (EGD);  Surgeon: MalcoLadene Artist  [facility];  Service: Endoscopy;  Laterality: N/A;  . Esophagogastroduodenoscopy (egd) with propofol N/A 08/15/2015     Procedure: ESOPHAGOGASTRODUODENOSCOPY (EGD) WITH PROPOFOL;  Surgeon: MalcoLadene Artist  [facility];  Service: Endoscopy;  Laterality: N/A;    SOCIAL HISTORY: Social History   Social History  . Marital Status: Married    Spouse Name: N/A  . Number of Children: N/A  . Years of Education: N/A   Occupational History  . Not on file.   Social History Main Topics  . Smoking status: Former Smoker    Types: Cigarettes    Quit date: 11/07/2008  . Smokeless tobacco: Never Used  . Alcohol Use: Yes     Comment: occasional  . Drug Use: No  . Sexual Activity: No   Other Topics Concern  . Not on file   Social History Narrative   Married   Enjoys walking   Has lived in [place]    FAMILY HISTORY: No family history of liver disease or malignancy.  ALLERGIES:  has No Known Allergies.  MEDICATIONS:  Current Outpatient Prescriptions on File Prior to Visit  Medication Sig Dispense Refill  . clindamycin (CLINDAGEL) 1 % gel Apply topically 2 (two) times daily. 30 g 2  . diphenoxylate-atropine (LOMOTIL) 2.5-0.025 MG tablet Take 1-2 tablets by mouth  4 (four) times daily as needed for diarrhea or loose stools. 30 tablet 0  . folic acid (FOLVITE) 1 MG tablet Take 1 tablet (1 mg total) by mouth daily. 30 tablet 3  . hydrocortisone 1 % ointment Apply 1 application topically 2 (two) times daily. 56 g 0  . lidocaine-prilocaine (EMLA) cream Apply 1 application topically as needed. Apply to [facility] cath at least one hour before needle stick. 30 g 2  . lisinopril-hydrochlorothiazide (PRINZIDE,ZESTORETIC) 10-12.5 MG tablet     . loperamide (IMODIUM) 2 MG capsule Take 2 capsules (4 mg total) by mouth 4 (four) times daily as needed for diarrhea or loose stools (NO MORE THAN 8 TABLETS PER DAY.). 60 capsule 3  . magic mouthwash SOLN Take 5 mLs by mouth 4 (four) times daily. 120 mL 1  . magnesium oxide (MAG-OX) 400 (241.3 Mg) MG tablet Take 1 tablet (400 mg total) by mouth 3 (three) times  daily. 60 tablet 3  . metFORMIN (GLUCOPHAGE) 500 MG tablet Take 1 tablet (500 mg total) by mouth 2 (two) times daily with a meal. 60 tablet 0  . nadolol (CORGARD) 20 MG tablet Take 1 tablet (20 mg total) by mouth 2 (two) times daily. THIS IS A ONE TIME ORDER.  FUTURE REFILLS NEED TO BE DONE BY PRIMARY MD. 60 tablet 1  . ondansetron (ZOFRAN) 8 MG tablet Take 1 tablet (8 mg total) by mouth every 8 (eight) hours as needed for nausea or vomiting. 45 tablet 2  . pantoprazole (PROTONIX) 40 MG tablet Take 1 tablet (40 mg total) by mouth daily. 30 tablet 2  . prochlorperazine (COMPAZINE) 10 MG tablet Take 1 tablet (10 mg total) by mouth every 6 (six) hours as needed for nausea or vomiting. 30 tablet 2   Current Facility-Administered Medications on File Prior to Visit  Medication Dose Route Frequency Provider Last Rate Last Dose  . 0.9 %  sodium chloride infusion   Intravenous Once Truitt Merle, MD      . sodium chloride 0.9 % injection 10 mL  10 mL Intracatheter PRN Truitt Merle, MD   10 mL at 10/03/15 1705  ;   REVIEW OF SYSTEMS:   Constitutional: Denies fevers, chills or abnormal night sweats, (+) fatigue  Eyes: Denies blurriness of vision, double vision or watery eyes Ears, nose, mouth, throat, and face: Denies mucositis or sore throat Respiratory: Denies cough, dyspnea or wheezes Cardiovascular: Denies palpitation, chest discomfort or lower extremity swelling Gastrointestinal: Denies nausea, heartburn or change in bowel habits Skin: Denies abnormal skin rashes Lymphatics: Denies new lymphadenopathy or easy bruising Neurological:Denies numbness, tingling or new weaknesses Behavioral/Psych: Mood is stable, no new changes, (+) insomnia All other systems were reviewed with the patient and are negative.  PHYSICAL EXAMINATION: BP 124/68 mmHg  Pulse 86  Temp(Src) 98.5 F (36.9 C) (Oral)  Resp 18  Ht '5\' 5"'  (1.651 m)  Wt 143 lb 11.2 oz (65.182 kg)  BMI 23.91 kg/m2  SpO2 100%  ECOG PERFORMANCE STATUS:  1 Vital sign were taken at in the infusion room, within normal limits. GENERAL:alert, no distress and comfortable SKIN: (+) Dry skin,  no skin ulcer, (+) scatter skin rashes on his neck and upper chest, some are resolving with skin pigmentation. No discharge. EYES: normal, conjunctiva are pink and non-injected, sclera clear OROPHARYNX:no exudate, no erythema and lips, buccal mucosa, and tongue with mild discoloration at the tip.  NECK: supple, thyroid normal size, non-tender, without nodularity LYMPH:  no palpable lymphadenopathy in the cervical,  axillary or inguinal LUNGS: clear to auscultation and percussion with normal breathing effort, no rales   HEART: regular rate & rhythm and no murmurs and no lower extremity edema ABDOMEN:abdomen soft, non-tender, no hepatomegaly, no splenomegaly and normal bowel sounds Musculoskeletal:no cyanosis of digits and no clubbing  PSYCH: alert & oriented x 3 with fluent speech NEURO: no focal motor/sensory deficits  LABORATORY DATA:  I have reviewed the data as listed CBC Latest Ref Rng 04/02/2016 03/26/2016 03/19/2016  WBC 4.0 - 10.3 10e3/uL 4.7 2.4(L) 2.8(L)  Hemoglobin 13.0 - 17.1 g/dL 8.9(L) 9.5(L) 8.9(L)  Hematocrit 38.4 - 49.9 % 26.8(L) 28.6(L) 27.1(L)  Platelets 140 - 400 10e3/uL 167 138(L) 122(L)   ANC is 3.4  CMP Latest Ref Rng 04/02/2016 03/19/2016 03/04/2016  Glucose 70 - 140 mg/dl 118 270(H) 283(H)  BUN 7.0 - 26.0 mg/dL 9.4 13.1 19.3  Creatinine 0.7 - 1.3 mg/dL 0.9 0.9 0.9  Sodium 136 - 145 mEq/L 142 138 138  Potassium 3.5 - 5.1 mEq/L 4.2 4.1 3.9  CO2 22 - 29 mEq/L '27 23 24  ' Calcium 8.4 - 10.4 mg/dL 8.7 8.5 8.9  Total Protein 6.4 - 8.3 g/dL 7.3 6.6 7.0  Total Bilirubin 0.20 - 1.20 mg/dL 0.48 0.60 0.76  Alkaline Phos 40 - 150 U/L 190(H) 171(H) 207(H)  AST 5 - 34 U/L 30 24 42(H)  ALT 0 - 55 U/L 22 24 43   Mag pending  ANC 2.2 today   INITIAL tumor markers AFP 3.2 CEA > 10,000 CA 19-9 12,929.6  RResults for JARONE, OSTERGAARD (MRN [id number])  as of 04/02/2016 09:45  Ref. Range 10/31/2015 08:01 01/08/2016 09:53 02/19/2016 08:24 03/12/2016 13:11  CA 19-9 Latest Ref Range: <35.0 U/mL 40.7 (H)     CEA Latest Units: ng/mL 50.6 (H) 80.3 (H) 134.6 (H)   CEA Latest Ref Range: 0.0-4.7 ng/mL  110.6 (H) 155.8 (H) 221.4 (H)     Pathology report  Liver, needle/core biopsy - METASTATIC ADENOCARCINOMA, SEE COMMENT. Microscopic Comment The adenocarcinoma demonstrates the following immunophenotype: Cytokeratin 7 - negative expression. Cytokeratin 20 - strong diffuse expression. CD2 - strong diffuse expression. Overall the morphology and immunophenotype are that of metastatic adenocarcinoma primary to colorectum. The recent nuclear medicine scan demonstrating sigmoid mass with associated liver masses is noted.  [facility] test result:    RADIOGRAPHIC STUDIES: I have personally reviewed the outside CT scan image with patient and his son.   CT chest, abdomen and pelvis with IV contrast oN  02/29/2016 IMPRESSION: 1. Liver metastasis are improved from previous exam. No new or progressive liver lesions identified 2. Similar appearance of right adrenal gland metastasis. 3. There are 2 small nodules within the left lung, which are new from previous exam and may represent early pulmonary metastasis. 4. Aortic atherosclerosis and multi vessel coronary artery calcification.   ASSESSMENT & PLAN:  70 year old [place] male, with past history of hypertension and dilated nonischemic gammopathy with EF 25%, no clinical signs of heart failure, who was found to have hepatitis B infection lately, and multiple liver lesions on the CT scan. He has extremely high CEA and CA 19-9 levels. PET scan reviewed a hypermetabolic sigmoid colon mass, diffuse liver metastasis, probable lung and adrenal gland metastasis.  1. Metastatic sigmoid colon cancer, with diffuse liver, lungs, node and left adrenal gland metastases. KRAS/NRAS wild type, MSI-stable -Liver biopsy  showed metastatic adenocarcinoma. His tumor were strongly positive for CK20 and CD2, consistent with primary colorectal primary. KRAS and NRAS mutations were not detected.  -Pt  understands that this is incurable cancer, and he has very high disease burden and overall prognosis is poor. The treatment goal is palliative -He had excellent response to first-line FOLFOX, but treatment was complicated with GI bleeding and neutropenic fever, resolved now. -he is currently on second line chemotherapy with irritecan and panitumumab, tolerating well  - I reviewed his restaging CT scan from 02/29/2016, which showed a continuous responding in liver metastasis, no other new lesions. -here to severe diarrhea from Irinotecan, dose has been reduced from 244m (1677mm2) to 20049m125m55m, rounded), which has been increased to 140mg85mll slightly increase his dose to 220 mg today, and if tolerates well, we'll go up to 240 mg (140mg/53mfrom next cycle 13 and he tolerated well. -His tumor marker CEA has slightly increased lately, we'll continue monitoring. -He is clinical doing well, lab reviewed, adequate for treatment, we'll continue chemotherapy today.  -restaging scan in early Aug   2. Grade 1-2 skin rashes, stable  -Secondary to panitumumab, improved and stable lately  -continue hydrocortisone 2.5%, and clindamycin gel 1%  twice daily as needed  -He knows to avoid sun exposure, and call me if it gets worse.  3. Gastric ulcer with significant GI bleeding in April and Aug 2016 -He is on PPI, continue once daily  -Repeat his EGD on 08/15/2015 showed near complete healing of his gastric ulcer -continue Nadolol 20mg b30mper Dr. Stark -Fuller Plancourage him to follow-up with Dr. Stark  Fuller Planpe 2 DM  -HbA1c was 8.5, blood glucose not well controlled, he run out of metformin  -I encourage him to follow-up with his primary care physician Dr. WilliamJimmye Normanall his office for metformin refill today   5. HTN, Dilated  nonischemic ischemia cardiomyopathy with EF 25% -He is clinically doing well without symptoms of CHF. However this is probably going to impact his chemotherapy.Will try to avoid cardiotoxic chemotherapy agent and avoid fluid overload during chemotherapy. -Continue follow-up with cardiology. -will hold on his lisinopril-HCTZ for now, his BP has been normal lately   6 Hepatitis B carrier, with mild portal hypertension  -Per liver clinic, no need for treatment. Follow-up with Dr. Starks Silvio Patealnutrition -I encouraged him to eat more, and take supplements as needed. -improved, gained weight back after I reduce his chemotherapy dose  -follow up with Dietitian   8. Anemia secondary to GI bleeding, iron deficiency and chemo  -Repeat lab on 06/06/2015 showed ferritin 117, serum iron 24, saturation 8%, which supports iron deficiency -He received IV Feraheme again in Aug 2016 after GI bleeding  -Repeat a ferritin was 273 on 10/03/2015, much improved  -he received iv feraheme again on 11/27/2015 and 12/04/2015, but anemia did not improve much. -His anemia has been getting worse lately, probably related to chemotherapy, we'll close monitor any signs of GI bleeding. -Blood transfusion if hemoglobin less than 8  11. hypomagnesemia  -secondary to panitumumab -he receives IV mag 6 g every week -He is taking magnesium pill 2 tablets 3 times a day, we'll hold it for now due to his diarrhea -follow up weekly    Plan -Lab results reviewed, adequate for treatment, we'll proceed chemotherapy and panitumumab today and continue every 2 weeks. He tolerated irritation 240 mg (140mg/m232mll, we'll continue the current dose. -magnesium infusion weekly -I'll see him back in 4 weeks  All questions were answered. The patient knows to call the clinic with any problems, questions or concerns.  I spent 25 minutes  counseling the patient face to face. The total time spent in the appointment was 30 minutes and more  than 50% was on counseling.     Truitt Merle, MD 04/02/2016

## 2016-04-02 NOTE — Patient Instructions (Signed)
[place] Discharge Instructions for Patients Receiving Chemotherapy  Today you received the following chemotherapy agents: vectibix, irinotecan  To help prevent nausea and vomiting after your treatment, we encourage you to take your nausea medication.  Take it as often as prescribed.     If you develop nausea and vomiting that is not controlled by your nausea medication, call the clinic. If it is after clinic hours your family physician or the after hours number for the clinic or go to the Emergency Department.   BELOW ARE SYMPTOMS THAT SHOULD BE REPORTED IMMEDIATELY:  *FEVER GREATER THAN 100.5 F  *CHILLS WITH OR WITHOUT FEVER  NAUSEA AND VOMITING THAT IS NOT CONTROLLED WITH YOUR NAUSEA MEDICATION  *UNUSUAL SHORTNESS OF BREATH  *UNUSUAL BRUISING OR BLEEDING  TENDERNESS IN MOUTH AND THROAT WITH OR WITHOUT PRESENCE OF ULCERS  *URINARY PROBLEMS  *BOWEL PROBLEMS  UNUSUAL RASH Items with * indicate a potential emergency and should be followed up as soon as possible.  Feel free to call the clinic you have any questions or concerns. The clinic phone number is [phone].   I have been informed and understand all the instructions given to me. I know to contact the clinic, my physician, or go to the Emergency Department if any problems should occur. I do not have any questions at this time, but understand that I may call the clinic during office hours   should I have any questions or need assistance in obtaining follow up care.    __________________________________________  _____________  __________ Signature of Patient or Authorized Representative            Date                   Time    __________________________________________ Nurse's Signature  Hypomagnesemia Hypomagnesemia is a condition in which the level of magnesium in the blood is low. Magnesium is a mineral that is found in many foods. It is used in many different processes in the body.  Hypomagnesemia can affect every organ in the body. It can cause life-threatening problems. CAUSES Causes of hypomagnesemia include:  Not getting enough magnesium in your diet.  Malnutrition.  Problems with absorbing magnesium from the intestines.  Dehydration.  Alcohol abuse.  Vomiting.  Severe diarrhea.  Some medicines, including medicines that make you urinate more.  Certain diseases, such as kidney disease, diabetes, and overactive thyroid. SIGNS AND SYMPTOMS  Involuntary shaking or trembling of a body part (tremor).  Confusion.  Muscle weakness.  Sensitivity to light, sound, and touch.  Psychiatric issues, such as depression, irritability, or psychosis.  Sudden tightening of muscles (muscle spasms).  Tingling in the arms and legs.  A feeling of fluttering of the heart. These symptoms are more severe if magnesium levels drop suddenly. DIAGNOSIS To make a diagnosis, your health care provider will do a physical exam and order blood and urine tests. TREATMENT Treatment will depend on the cause and the severity of your condition. It may involve:  A magnesium supplement. This can be taken in pill form. It can also be given through an IV tube. This is usually done if the condition is severe.  Changes to your diet. You may be directed to eat foods that have a lot of magnesium, such as green leafy vegetables, peas, beans, and nuts.  Eliminating alcohol from your diet. HOME CARE INSTRUCTIONS  Include foods with magnesium in your diet. Foods that are rich in magnesium include green vegetables, beans, nuts  and seeds, and whole grains.  Take medicines only as directed by your health care provider.  Take magnesium supplements if your health care provider instructs you to do that. Take them as directed.  Have your magnesium levels monitored as directed by your health care provider.  When you are active, drink fluids that contain electrolytes.  Keep all follow-up  visits as directed by your health care provider. This is important. SEEK MEDICAL CARE IF:  You get worse instead of better.  Your symptoms return. SEEK IMMEDIATE MEDICAL CARE IF:  Your symptoms are severe.   This information is not intended to replace advice given to you by your health care provider. Make sure you discuss any questions you have with your health care provider.   Document Released: 07/10/2005 Document Revised: 11/04/2014 Document Reviewed: 05/30/2014 Elsevier Interactive Patient Education [employer].

## 2016-04-04 ENCOUNTER — Ambulatory Visit: Payer: PPO

## 2016-04-09 ENCOUNTER — Ambulatory Visit (HOSPITAL_BASED_OUTPATIENT_CLINIC_OR_DEPARTMENT_OTHER): Payer: PPO

## 2016-04-09 ENCOUNTER — Other Ambulatory Visit (HOSPITAL_BASED_OUTPATIENT_CLINIC_OR_DEPARTMENT_OTHER): Payer: PPO

## 2016-04-09 VITALS — BP 113/69 | HR 88 | Temp 98.0°F | Resp 18

## 2016-04-09 DIAGNOSIS — C187 Malignant neoplasm of sigmoid colon: Secondary | ICD-10-CM

## 2016-04-09 DIAGNOSIS — D509 Iron deficiency anemia, unspecified: Secondary | ICD-10-CM

## 2016-04-09 DIAGNOSIS — Z95828 Presence of other vascular implants and grafts: Secondary | ICD-10-CM

## 2016-04-09 LAB — CBC WITH DIFFERENTIAL/PLATELET
BASO%: 0.7 % (ref 0.0–2.0)
BASOS ABS: 0 10*3/uL (ref 0.0–0.1)
EOS ABS: 0.1 10*3/uL (ref 0.0–0.5)
EOS%: 3.5 % (ref 0.0–7.0)
HEMATOCRIT: 25.9 % — AB (ref 38.4–49.9)
HEMOGLOBIN: 8.5 g/dL — AB (ref 13.0–17.1)
LYMPH%: 19.8 % (ref 14.0–49.0)
MCH: 28.8 pg (ref 27.2–33.4)
MCHC: 32.7 g/dL (ref 32.0–36.0)
MCV: 88.4 fL (ref 79.3–98.0)
MONO#: 0.3 10*3/uL (ref 0.1–0.9)
MONO%: 8.1 % (ref 0.0–14.0)
NEUT%: 67.9 % (ref 39.0–75.0)
NEUTROS ABS: 2.2 10*3/uL (ref 1.5–6.5)
Platelets: 160 10*3/uL (ref 140–400)
RBC: 2.94 10*6/uL — ABNORMAL LOW (ref 4.20–5.82)
RDW: 16.9 % — AB (ref 11.0–14.6)
WBC: 3.3 10*3/uL — AB (ref 4.0–10.3)
lymph#: 0.6 10*3/uL — ABNORMAL LOW (ref 0.9–3.3)

## 2016-04-09 LAB — MAGNESIUM: Magnesium: 1 mg/dl — CL (ref 1.5–2.5)

## 2016-04-09 MED ORDER — FERUMOXYTOL INJECTION 510 MG/17 ML: 510.0000 mg | Freq: Once | INTRAVENOUS | Status: DC

## 2016-04-09 MED ORDER — HEPARIN SOD (PORK) LOCK FLUSH 100 UNIT/ML IV SOLN
250.0000 [IU] | Freq: Once | INTRAVENOUS | Status: DC | PRN
  Filled 2016-04-09: qty 5

## 2016-04-09 MED ORDER — SODIUM CHLORIDE 0.9 % IJ SOLN
10.0000 mL | INTRAMUSCULAR | Status: DC | PRN
  Filled 2016-04-09: qty 10

## 2016-04-09 MED ORDER — HEPARIN SOD (PORK) LOCK FLUSH 100 UNIT/ML IV SOLN
500.0000 [IU] | Freq: Once | INTRAVENOUS | Status: AC | PRN
  Administered 2016-04-09: 500 [IU] via INTRAVENOUS
  Filled 2016-04-09: qty 5

## 2016-04-09 MED ORDER — SODIUM CHLORIDE 0.9 % IV SOLN
Freq: Once | INTRAVENOUS | Status: AC
  Administered 2016-04-09: 12:00:00 via INTRAVENOUS
  Filled 2016-04-09: qty 250

## 2016-04-09 MED ORDER — ALTEPLASE 2 MG IJ SOLR
2.0000 mg | Freq: Once | INTRAMUSCULAR | Status: DC | PRN
  Filled 2016-04-09: qty 2

## 2016-04-09 NOTE — Progress Notes (Signed)
OK to give IV Magnesium with mag level-1.0 per [name].

## 2016-04-09 NOTE — Patient Instructions (Signed)
Hypomagnesemia °Hypomagnesemia is a condition in which the level of magnesium in the blood is low. Magnesium is a mineral that is found in many foods. It is used in many different processes in the body. Hypomagnesemia can affect every organ in the body. It can cause life-threatening problems. °CAUSES °Causes of hypomagnesemia include: °· Not getting enough magnesium in your diet. °· Malnutrition. °· Problems with absorbing magnesium from the intestines. °· Dehydration. °· Alcohol abuse. °· Vomiting. °· Severe diarrhea. °· Some medicines, including medicines that make you urinate more. °· Certain diseases, such as kidney disease, diabetes, and overactive thyroid. °SIGNS AND SYMPTOMS °· Involuntary shaking or trembling of a body part (tremor). °· Confusion. °· Muscle weakness. °· Sensitivity to light, sound, and touch. °· Psychiatric issues, such as depression, irritability, or psychosis. °· Sudden tightening of muscles (muscle spasms). °· Tingling in the arms and legs. °· A feeling of fluttering of the heart. °These symptoms are more severe if magnesium levels drop suddenly. °DIAGNOSIS °To make a diagnosis, your health care provider will do a physical exam and order blood and urine tests. °TREATMENT °Treatment will depend on the cause and the severity of your condition. It may involve: °· A magnesium supplement. This can be taken in pill form. It can also be given through an IV tube. This is usually done if the condition is severe. °· Changes to your diet. You may be directed to eat foods that have a lot of magnesium, such as green leafy vegetables, peas, beans, and nuts. °· Eliminating alcohol from your diet. °HOME CARE INSTRUCTIONS °· Include foods with magnesium in your diet. Foods that are rich in magnesium include green vegetables, beans, nuts and seeds, and whole grains. °· Take medicines only as directed by your health care provider. °· Take magnesium supplements if your health care provider instructs you to  do that. Take them as directed. °· Have your magnesium levels monitored as directed by your health care provider. °· When you are active, drink fluids that contain electrolytes. °· Keep all follow-up visits as directed by your health care provider. This is important. °SEEK MEDICAL CARE IF: °· You get worse instead of better. °· Your symptoms return. °SEEK IMMEDIATE MEDICAL CARE IF: °· Your symptoms are severe. °  °This information is not intended to replace advice given to you by your health care provider. Make sure you discuss any questions you have with your health care provider. °  °Document Released: 07/10/2005 Document Revised: 11/04/2014 Document Reviewed: 05/30/2014 °Elsevier Interactive Patient Education ©2016 [employer] ° °

## 2016-04-10 LAB — CEA: CEA1: 289.7 ng/mL — AB (ref 0.0–4.7)

## 2016-04-16 ENCOUNTER — Ambulatory Visit: Payer: PPO

## 2016-04-16 ENCOUNTER — Ambulatory Visit (HOSPITAL_BASED_OUTPATIENT_CLINIC_OR_DEPARTMENT_OTHER): Payer: PPO

## 2016-04-16 ENCOUNTER — Other Ambulatory Visit (HOSPITAL_BASED_OUTPATIENT_CLINIC_OR_DEPARTMENT_OTHER): Payer: PPO

## 2016-04-16 VITALS — BP 130/65 | HR 83 | Temp 98.4°F | Resp 18

## 2016-04-16 DIAGNOSIS — Z5111 Encounter for antineoplastic chemotherapy: Secondary | ICD-10-CM

## 2016-04-16 DIAGNOSIS — C187 Malignant neoplasm of sigmoid colon: Secondary | ICD-10-CM

## 2016-04-16 DIAGNOSIS — Z95828 Presence of other vascular implants and grafts: Secondary | ICD-10-CM

## 2016-04-16 DIAGNOSIS — Z5112 Encounter for antineoplastic immunotherapy: Secondary | ICD-10-CM

## 2016-04-16 DIAGNOSIS — D509 Iron deficiency anemia, unspecified: Secondary | ICD-10-CM

## 2016-04-16 DIAGNOSIS — D500 Iron deficiency anemia secondary to blood loss (chronic): Secondary | ICD-10-CM

## 2016-04-16 LAB — COMPREHENSIVE METABOLIC PANEL
ALT: 26 U/L (ref 0–55)
ANION GAP: 8 meq/L (ref 3–11)
AST: 30 U/L (ref 5–34)
Albumin: 3.4 g/dL — ABNORMAL LOW (ref 3.5–5.0)
Alkaline Phosphatase: 160 U/L — ABNORMAL HIGH (ref 40–150)
BUN: 16 mg/dL (ref 7.0–26.0)
CALCIUM: 8.7 mg/dL (ref 8.4–10.4)
CHLORIDE: 106 meq/L (ref 98–109)
CO2: 25 meq/L (ref 22–29)
CREATININE: 0.9 mg/dL (ref 0.7–1.3)
EGFR: 89 mL/min/{1.73_m2} — AB (ref >90)
Glucose: 174 mg/dl — ABNORMAL HIGH (ref 70–140)
POTASSIUM: 4 meq/L (ref 3.5–5.1)
Sodium: 139 mEq/L (ref 136–145)
Total Bilirubin: 0.63 mg/dL (ref 0.20–1.20)
Total Protein: 7 g/dL (ref 6.4–8.3)

## 2016-04-16 LAB — CBC WITH DIFFERENTIAL/PLATELET
BASO%: 0.5 % (ref 0.0–2.0)
Basophils Absolute: 0 10*3/uL (ref 0.0–0.1)
EOS ABS: 0.1 10*3/uL (ref 0.0–0.5)
EOS%: 2.1 % (ref 0.0–7.0)
HCT: 26.6 % — ABNORMAL LOW (ref 38.4–49.9)
HGB: 8.7 g/dL — ABNORMAL LOW (ref 13.0–17.1)
LYMPH%: 16.8 % (ref 14.0–49.0)
MCH: 28.8 pg (ref 27.2–33.4)
MCHC: 32.7 g/dL (ref 32.0–36.0)
MCV: 88.1 fL (ref 79.3–98.0)
MONO#: 0.5 10*3/uL (ref 0.1–0.9)
MONO%: 13.8 % (ref 0.0–14.0)
NEUT%: 66.8 % (ref 39.0–75.0)
NEUTROS ABS: 2.5 10*3/uL (ref 1.5–6.5)
PLATELETS: 131 10*3/uL — AB (ref 140–400)
RBC: 3.02 10*6/uL — AB (ref 4.20–5.82)
RDW: 16.3 % — ABNORMAL HIGH (ref 11.0–14.6)
WBC: 3.8 10*3/uL — AB (ref 4.0–10.3)
lymph#: 0.6 10*3/uL — ABNORMAL LOW (ref 0.9–3.3)

## 2016-04-16 LAB — IRON AND TIBC
%SAT: 13 % — ABNORMAL LOW (ref 20–55)
Iron: 29 ug/dL — ABNORMAL LOW (ref 42–163)
TIBC: 216 ug/dL (ref 202–409)
UIBC: 188 ug/dL (ref 117–376)

## 2016-04-16 LAB — FERRITIN: FERRITIN: 768 ng/mL — AB (ref 22–316)

## 2016-04-16 LAB — MAGNESIUM: MAGNESIUM: 0.9 mg/dL — AB (ref 1.5–2.5)

## 2016-04-16 MED ORDER — HEPARIN SOD (PORK) LOCK FLUSH 100 UNIT/ML IV SOLN
500.0000 [IU] | Freq: Once | INTRAVENOUS | Status: AC | PRN
  Administered 2016-04-16: 500 [IU]
  Filled 2016-04-16: qty 5

## 2016-04-16 MED ORDER — SODIUM CHLORIDE 0.9 % IV SOLN
Freq: Once | INTRAVENOUS | Status: AC
  Administered 2016-04-16: 16:00:00 via INTRAVENOUS

## 2016-04-16 MED ORDER — PANITUMUMAB CHEMO INJECTION 100 MG/5ML
6.3000 mg/kg | Freq: Once | INTRAVENOUS | Status: AC
  Administered 2016-04-16: 400 mg via INTRAVENOUS
  Filled 2016-04-16: qty 20

## 2016-04-16 MED ORDER — SODIUM CHLORIDE 0.9 % IV SOLN
Freq: Once | INTRAVENOUS | Status: AC
  Administered 2016-04-16: 12:00:00 via INTRAVENOUS
  Filled 2016-04-16: qty 4

## 2016-04-16 MED ORDER — SODIUM CHLORIDE 0.9% FLUSH
10.0000 mL | INTRAVENOUS | Status: DC | PRN
  Administered 2016-04-16: 10 mL via INTRAVENOUS
  Filled 2016-04-16: qty 10

## 2016-04-16 MED ORDER — ATROPINE SULFATE 1 MG/ML IJ SOLN
0.5000 mg | Freq: Once | INTRAMUSCULAR | Status: AC | PRN
  Administered 2016-04-16: 0.5 mg via INTRAVENOUS

## 2016-04-16 MED ORDER — SODIUM CHLORIDE 0.9 % IV SOLN
510.0000 mg | Freq: Once | INTRAVENOUS | Status: DC
  Filled 2016-04-16: qty 17

## 2016-04-16 MED ORDER — SODIUM CHLORIDE 0.9 % IV SOLN
141.0000 mg/m2 | Freq: Once | INTRAVENOUS | Status: AC
  Administered 2016-04-16: 240 mg via INTRAVENOUS
  Filled 2016-04-16: qty 4

## 2016-04-16 MED ORDER — SODIUM CHLORIDE 0.9 % IV SOLN
Freq: Once | INTRAVENOUS | Status: AC
  Administered 2016-04-16: 12:00:00 via INTRAVENOUS
  Filled 2016-04-16: qty 250

## 2016-04-16 MED ORDER — SODIUM CHLORIDE 0.9 % IJ SOLN
10.0000 mL | INTRAMUSCULAR | Status: DC | PRN
  Administered 2016-04-16: 10 mL
  Filled 2016-04-16: qty 10

## 2016-04-16 MED ORDER — ALTEPLASE 2 MG IJ SOLR
2.0000 mg | Freq: Once | INTRAMUSCULAR | Status: DC | PRN
  Filled 2016-04-16: qty 2

## 2016-04-16 MED ORDER — ATROPINE SULFATE 1 MG/ML IJ SOLN
INTRAMUSCULAR | Status: AC
  Filled 2016-04-16: qty 1

## 2016-04-16 NOTE — Patient Instructions (Signed)

## 2016-04-16 NOTE — Patient Instructions (Signed)
[facility] Discharge Instructions for Patients Receiving Chemotherapy  Today you received the following chemotherapy agents: vectibix, irinotecan  To help prevent nausea and vomiting after your treatment, we encourage you to take your nausea medication.  Take it as often as prescribed.     If you develop nausea and vomiting that is not controlled by your nausea medication, call the clinic. If it is after clinic hours your family physician or the after hours number for the clinic or go to the Emergency Department.   BELOW ARE SYMPTOMS THAT SHOULD BE REPORTED IMMEDIATELY:  *FEVER GREATER THAN 100.5 F  *CHILLS WITH OR WITHOUT FEVER  NAUSEA AND VOMITING THAT IS NOT CONTROLLED WITH YOUR NAUSEA MEDICATION  *UNUSUAL SHORTNESS OF BREATH  *UNUSUAL BRUISING OR BLEEDING  TENDERNESS IN MOUTH AND THROAT WITH OR WITHOUT PRESENCE OF ULCERS  *URINARY PROBLEMS  *BOWEL PROBLEMS  UNUSUAL RASH Items with * indicate a potential emergency and should be followed up as soon as possible.  Feel free to call the clinic you have any questions or concerns. The clinic phone number is [phone].   I have been informed and understand all the instructions given to me. I know to contact the clinic, my physician, or go to the Emergency Department if any problems should occur. I do not have any questions at this time, but understand that I may call the clinic during office hours   should I have any questions or need assistance in obtaining follow up care.    __________________________________________  _____________  __________ Signature of Patient or Authorized Representative            Date                   Time    __________________________________________ Nurse's Signature  Hypomagnesemia Hypomagnesemia is a condition in which the level of magnesium in the blood is low. Magnesium is a mineral that is found in many foods. It is used in many different processes in the body.  Hypomagnesemia can affect every organ in the body. It can cause life-threatening problems. CAUSES Causes of hypomagnesemia include:  Not getting enough magnesium in your diet.  Malnutrition.  Problems with absorbing magnesium from the intestines.  Dehydration.  Alcohol abuse.  Vomiting.  Severe diarrhea.  Some medicines, including medicines that make you urinate more.  Certain diseases, such as kidney disease, diabetes, and overactive thyroid. SIGNS AND SYMPTOMS  Involuntary shaking or trembling of a body part (tremor).  Confusion.  Muscle weakness.  Sensitivity to light, sound, and touch.  Psychiatric issues, such as depression, irritability, or psychosis.  Sudden tightening of muscles (muscle spasms).  Tingling in the arms and legs.  A feeling of fluttering of the heart. These symptoms are more severe if magnesium levels drop suddenly. DIAGNOSIS To make a diagnosis, your health care provider will do a physical exam and order blood and urine tests. TREATMENT Treatment will depend on the cause and the severity of your condition. It may involve:  A magnesium supplement. This can be taken in pill form. It can also be given through an IV tube. This is usually done if the condition is severe.  Changes to your diet. You may be directed to eat foods that have a lot of magnesium, such as green leafy vegetables, peas, beans, and nuts.  Eliminating alcohol from your diet. HOME CARE INSTRUCTIONS  Include foods with magnesium in your diet. Foods that are rich in magnesium include green vegetables, beans, nuts  and seeds, and whole grains.  Take medicines only as directed by your health care provider.  Take magnesium supplements if your health care provider instructs you to do that. Take them as directed.  Have your magnesium levels monitored as directed by your health care provider.  When you are active, drink fluids that contain electrolytes.  Keep all follow-up  visits as directed by your health care provider. This is important. SEEK MEDICAL CARE IF:  You get worse instead of better.  Your symptoms return. SEEK IMMEDIATE MEDICAL CARE IF:  Your symptoms are severe.   This information is not intended to replace advice given to you by your health care provider. Make sure you discuss any questions you have with your health care provider.   Document Released: 07/10/2005 Document Revised: 11/04/2014 Document Reviewed: 05/30/2014 Elsevier Interactive Patient Education 2016 [employer].   Hypomagnesemia Hypomagnesemia is a condition in which the level of magnesium in the blood is low. Magnesium is a mineral that is found in many foods. It is used in many different processes in the body. Hypomagnesemia can affect every organ in the body. It can cause life-threatening problems. CAUSES Causes of hypomagnesemia include:  Not getting enough magnesium in your diet.  Malnutrition.  Problems with absorbing magnesium from the intestines.  Dehydration.  Alcohol abuse.  Vomiting.  Severe diarrhea.  Some medicines, including medicines that make you urinate more.  Certain diseases, such as kidney disease, diabetes, and overactive thyroid. SIGNS AND SYMPTOMS  Involuntary shaking or trembling of a body part (tremor).  Confusion.  Muscle weakness.  Sensitivity to light, sound, and touch.  Psychiatric issues, such as depression, irritability, or psychosis.  Sudden tightening of muscles (muscle spasms).  Tingling in the arms and legs.  A feeling of fluttering of the heart. These symptoms are more severe if magnesium levels drop suddenly. DIAGNOSIS To make a diagnosis, your health care provider will do a physical exam and order blood and urine tests. TREATMENT Treatment will depend on the cause and the severity of your condition. It may involve:  A magnesium supplement. This can be taken in pill form. It can also be given through an IV  tube. This is usually done if the condition is severe.  Changes to your diet. You may be directed to eat foods that have a lot of magnesium, such as green leafy vegetables, peas, beans, and nuts.  Eliminating alcohol from your diet. HOME CARE INSTRUCTIONS  Include foods with magnesium in your diet. Foods that are rich in magnesium include green vegetables, beans, nuts and seeds, and whole grains.  Take medicines only as directed by your health care provider.  Take magnesium supplements if your health care provider instructs you to do that. Take them as directed.  Have your magnesium levels monitored as directed by your health care provider.  When you are active, drink fluids that contain electrolytes.  Keep all follow-up visits as directed by your health care provider. This is important. SEEK MEDICAL CARE IF:  You get worse instead of better.  Your symptoms return. SEEK IMMEDIATE MEDICAL CARE IF:  Your symptoms are severe.   This information is not intended to replace advice given to you by your health care provider. Make sure you discuss any questions you have with your health care provider.   Document Released: 07/10/2005 Document Revised: 11/04/2014 Document Reviewed: 05/30/2014 Elsevier Interactive Patient Education [employer].

## 2016-04-18 ENCOUNTER — Ambulatory Visit: Payer: PPO

## 2016-04-23 ENCOUNTER — Other Ambulatory Visit: Payer: Self-pay | Admitting: *Deleted

## 2016-04-23 ENCOUNTER — Other Ambulatory Visit: Payer: Self-pay | Admitting: Hematology

## 2016-04-23 ENCOUNTER — Ambulatory Visit (HOSPITAL_BASED_OUTPATIENT_CLINIC_OR_DEPARTMENT_OTHER): Payer: PPO

## 2016-04-23 VITALS — BP 101/66 | HR 98 | Temp 98.5°F | Resp 18

## 2016-04-23 DIAGNOSIS — C189 Malignant neoplasm of colon, unspecified: Secondary | ICD-10-CM

## 2016-04-23 DIAGNOSIS — C787 Secondary malignant neoplasm of liver and intrahepatic bile duct: Secondary | ICD-10-CM

## 2016-04-23 DIAGNOSIS — C187 Malignant neoplasm of sigmoid colon: Secondary | ICD-10-CM

## 2016-04-23 DIAGNOSIS — D509 Iron deficiency anemia, unspecified: Secondary | ICD-10-CM

## 2016-04-23 DIAGNOSIS — Z95828 Presence of other vascular implants and grafts: Secondary | ICD-10-CM

## 2016-04-23 MED ORDER — FOLIC ACID 1 MG PO TABS: 1.0000 mg | ORAL_TABLET | Freq: Every day | ORAL | Status: DC

## 2016-04-23 MED ORDER — HEPARIN SOD (PORK) LOCK FLUSH 100 UNIT/ML IV SOLN
500.0000 [IU] | Freq: Once | INTRAVENOUS | Status: AC | PRN
  Administered 2016-04-23: 500 [IU] via INTRAVENOUS
  Filled 2016-04-23: qty 5

## 2016-04-23 MED ORDER — PANTOPRAZOLE SODIUM 40 MG PO TBEC: 40.0000 mg | DELAYED_RELEASE_TABLET | Freq: Every day | ORAL | Status: DC

## 2016-04-23 MED ORDER — SODIUM CHLORIDE 0.9 % IJ SOLN
10.0000 mL | INTRAMUSCULAR | Status: DC | PRN
  Administered 2016-04-23: 10 mL via INTRAVENOUS
  Filled 2016-04-23: qty 10

## 2016-04-23 MED ORDER — PROCHLORPERAZINE MALEATE 10 MG PO TABS: 10.0000 mg | ORAL_TABLET | Freq: Four times a day (QID) | ORAL | Status: DC | PRN

## 2016-04-23 MED ORDER — MAGNESIUM OXIDE 400 (241.3 MG) MG PO TABS: 400.0000 mg | ORAL_TABLET | Freq: Three times a day (TID) | ORAL | Status: DC

## 2016-04-23 MED ORDER — SODIUM CHLORIDE 0.9 % IV SOLN
Freq: Once | INTRAVENOUS | Status: AC
  Administered 2016-04-23: 14:00:00 via INTRAVENOUS
  Filled 2016-04-23: qty 250

## 2016-04-23 NOTE — Patient Instructions (Signed)
Hypomagnesemia °Hypomagnesemia is a condition in which the level of magnesium in the blood is low. Magnesium is a mineral that is found in many foods. It is used in many different processes in the body. Hypomagnesemia can affect every organ in the body. It can cause life-threatening problems. °CAUSES °Causes of hypomagnesemia include: °· Not getting enough magnesium in your diet. °· Malnutrition. °· Problems with absorbing magnesium from the intestines. °· Dehydration. °· Alcohol abuse. °· Vomiting. °· Severe diarrhea. °· Some medicines, including medicines that make you urinate more. °· Certain diseases, such as kidney disease, diabetes, and overactive thyroid. °SIGNS AND SYMPTOMS °· Involuntary shaking or trembling of a body part (tremor). °· Confusion. °· Muscle weakness. °· Sensitivity to light, sound, and touch. °· Psychiatric issues, such as depression, irritability, or psychosis. °· Sudden tightening of muscles (muscle spasms). °· Tingling in the arms and legs. °· A feeling of fluttering of the heart. °These symptoms are more severe if magnesium levels drop suddenly. °DIAGNOSIS °To make a diagnosis, your health care provider will do a physical exam and order blood and urine tests. °TREATMENT °Treatment will depend on the cause and the severity of your condition. It may involve: °· A magnesium supplement. This can be taken in pill form. It can also be given through an IV tube. This is usually done if the condition is severe. °· Changes to your diet. You may be directed to eat foods that have a lot of magnesium, such as green leafy vegetables, peas, beans, and nuts. °· Eliminating alcohol from your diet. °HOME CARE INSTRUCTIONS °· Include foods with magnesium in your diet. Foods that are rich in magnesium include green vegetables, beans, nuts and seeds, and whole grains. °· Take medicines only as directed by your health care provider. °· Take magnesium supplements if your health care provider instructs you to  do that. Take them as directed. °· Have your magnesium levels monitored as directed by your health care provider. °· When you are active, drink fluids that contain electrolytes. °· Keep all follow-up visits as directed by your health care provider. This is important. °SEEK MEDICAL CARE IF: °· You get worse instead of better. °· Your symptoms return. °SEEK IMMEDIATE MEDICAL CARE IF: °· Your symptoms are severe. °  °This information is not intended to replace advice given to you by your health care provider. Make sure you discuss any questions you have with your health care provider. °  °Document Released: 07/10/2005 Document Revised: 11/04/2014 Document Reviewed: 05/30/2014 °Elsevier Interactive Patient Education ©2016 [employer] ° °

## 2016-04-23 NOTE — Progress Notes (Signed)
Okay to get fluids with lab results from 7 days ago per [name].

## 2016-04-29 ENCOUNTER — Encounter: Payer: Self-pay | Admitting: Hematology

## 2016-04-29 ENCOUNTER — Other Ambulatory Visit (HOSPITAL_BASED_OUTPATIENT_CLINIC_OR_DEPARTMENT_OTHER): Payer: PPO

## 2016-04-29 ENCOUNTER — Ambulatory Visit: Payer: PPO

## 2016-04-29 ENCOUNTER — Ambulatory Visit (HOSPITAL_BASED_OUTPATIENT_CLINIC_OR_DEPARTMENT_OTHER): Payer: PPO

## 2016-04-29 ENCOUNTER — Ambulatory Visit (HOSPITAL_BASED_OUTPATIENT_CLINIC_OR_DEPARTMENT_OTHER): Payer: PPO | Admitting: Hematology

## 2016-04-29 VITALS — BP 121/66 | HR 88 | Temp 98.3°F | Resp 18 | Ht 65.0 in | Wt 145.6 lb

## 2016-04-29 DIAGNOSIS — C187 Malignant neoplasm of sigmoid colon: Secondary | ICD-10-CM

## 2016-04-29 DIAGNOSIS — I5042 Chronic combined systolic (congestive) and diastolic (congestive) heart failure: Secondary | ICD-10-CM

## 2016-04-29 DIAGNOSIS — C7800 Secondary malignant neoplasm of unspecified lung: Secondary | ICD-10-CM | POA: Diagnosis not present

## 2016-04-29 DIAGNOSIS — D509 Iron deficiency anemia, unspecified: Secondary | ICD-10-CM

## 2016-04-29 DIAGNOSIS — Z5112 Encounter for antineoplastic immunotherapy: Secondary | ICD-10-CM | POA: Diagnosis not present

## 2016-04-29 DIAGNOSIS — C787 Secondary malignant neoplasm of liver and intrahepatic bile duct: Secondary | ICD-10-CM | POA: Diagnosis not present

## 2016-04-29 DIAGNOSIS — Z95828 Presence of other vascular implants and grafts: Secondary | ICD-10-CM

## 2016-04-29 DIAGNOSIS — D6481 Anemia due to antineoplastic chemotherapy: Secondary | ICD-10-CM

## 2016-04-29 DIAGNOSIS — E119 Type 2 diabetes mellitus without complications: Secondary | ICD-10-CM

## 2016-04-29 DIAGNOSIS — C7972 Secondary malignant neoplasm of left adrenal gland: Secondary | ICD-10-CM

## 2016-04-29 DIAGNOSIS — D500 Iron deficiency anemia secondary to blood loss (chronic): Secondary | ICD-10-CM

## 2016-04-29 DIAGNOSIS — K766 Portal hypertension: Secondary | ICD-10-CM

## 2016-04-29 DIAGNOSIS — Z5111 Encounter for antineoplastic chemotherapy: Secondary | ICD-10-CM | POA: Diagnosis not present

## 2016-04-29 DIAGNOSIS — I10 Essential (primary) hypertension: Secondary | ICD-10-CM

## 2016-04-29 DIAGNOSIS — E46 Unspecified protein-calorie malnutrition: Secondary | ICD-10-CM

## 2016-04-29 DIAGNOSIS — C772 Secondary and unspecified malignant neoplasm of intra-abdominal lymph nodes: Secondary | ICD-10-CM

## 2016-04-29 LAB — CBC WITH DIFFERENTIAL/PLATELET
BASO%: 0.4 % (ref 0.0–2.0)
Basophils Absolute: 0 10*3/uL (ref 0.0–0.1)
EOS%: 2.6 % (ref 0.0–7.0)
Eosinophils Absolute: 0.1 10*3/uL (ref 0.0–0.5)
HCT: 25.8 % — ABNORMAL LOW (ref 38.4–49.9)
HGB: 8.4 g/dL — ABNORMAL LOW (ref 13.0–17.1)
LYMPH%: 14.6 % (ref 14.0–49.0)
MCH: 28.9 pg (ref 27.2–33.4)
MCHC: 32.7 g/dL (ref 32.0–36.0)
MCV: 88.2 fL (ref 79.3–98.0)
MONO#: 0.3 10*3/uL (ref 0.1–0.9)
MONO%: 10.1 % (ref 0.0–14.0)
NEUT%: 72.3 % (ref 39.0–75.0)
NEUTROS ABS: 2.4 10*3/uL (ref 1.5–6.5)
PLATELETS: 120 10*3/uL — AB (ref 140–400)
RBC: 2.92 10*6/uL — AB (ref 4.20–5.82)
RDW: 17.4 % — ABNORMAL HIGH (ref 11.0–14.6)
WBC: 3.3 10*3/uL — AB (ref 4.0–10.3)
lymph#: 0.5 10*3/uL — ABNORMAL LOW (ref 0.9–3.3)

## 2016-04-29 LAB — MAGNESIUM: MAGNESIUM: 0.9 mg/dL — AB (ref 1.5–2.5)

## 2016-04-29 LAB — COMPREHENSIVE METABOLIC PANEL
ALBUMIN: 3.4 g/dL — AB (ref 3.5–5.0)
ALK PHOS: 161 U/L — AB (ref 40–150)
ALT: 24 U/L (ref 0–55)
ANION GAP: 9 meq/L (ref 3–11)
AST: 28 U/L (ref 5–34)
BUN: 10.3 mg/dL (ref 7.0–26.0)
CALCIUM: 8.5 mg/dL (ref 8.4–10.4)
CO2: 22 mEq/L (ref 22–29)
CREATININE: 0.8 mg/dL (ref 0.7–1.3)
Chloride: 110 mEq/L — ABNORMAL HIGH (ref 98–109)
EGFR: >90 mL/min/{1.73_m2} (ref >90)
Glucose: 201 mg/dl — ABNORMAL HIGH (ref 70–140)
POTASSIUM: 4.3 meq/L (ref 3.5–5.1)
SODIUM: 141 meq/L (ref 136–145)
Total Bilirubin: 0.5 mg/dL (ref 0.20–1.20)
Total Protein: 7 g/dL (ref 6.4–8.3)

## 2016-04-29 MED ORDER — SODIUM CHLORIDE 0.9 % IV SOLN
510.0000 mg | Freq: Once | INTRAVENOUS | Status: DC
  Filled 2016-04-29: qty 17

## 2016-04-29 MED ORDER — SODIUM CHLORIDE 0.9 % IV SOLN
Freq: Once | INTRAVENOUS | Status: AC
  Administered 2016-04-29: 10:00:00 via INTRAVENOUS
  Filled 2016-04-29: qty 4

## 2016-04-29 MED ORDER — ALTEPLASE 2 MG IJ SOLR
2.0000 mg | Freq: Once | INTRAMUSCULAR | Status: DC | PRN
  Filled 2016-04-29: qty 2

## 2016-04-29 MED ORDER — ALTEPLASE 2 MG IJ SOLR
2.0000 mg | Freq: Once | INTRAMUSCULAR | Status: DC | PRN
Start: 2016-04-29 — End: 2016-04-29
  Filled 2016-04-29: qty 2

## 2016-04-29 MED ORDER — HEPARIN SOD (PORK) LOCK FLUSH 100 UNIT/ML IV SOLN
500.0000 [IU] | Freq: Once | INTRAVENOUS | Status: AC | PRN
  Administered 2016-04-29: 500 [IU]
  Filled 2016-04-29: qty 5

## 2016-04-29 MED ORDER — ATROPINE SULFATE 1 MG/ML IJ SOLN: 0.5000 mg | Freq: Once | INTRAMUSCULAR | Status: DC | PRN

## 2016-04-29 MED ORDER — SODIUM CHLORIDE 0.9 % IJ SOLN
10.0000 mL | INTRAMUSCULAR | Status: DC | PRN
  Administered 2016-04-29: 10 mL
  Filled 2016-04-29: qty 10

## 2016-04-29 MED ORDER — SODIUM CHLORIDE 0.9 % IJ SOLN
10.0000 mL | INTRAMUSCULAR | Status: DC | PRN
  Administered 2016-04-29: 10 mL via INTRAVENOUS
  Filled 2016-04-29: qty 10

## 2016-04-29 MED ORDER — SODIUM CHLORIDE 0.9 % IV SOLN
6.2000 mg/kg | Freq: Once | INTRAVENOUS | Status: AC
  Administered 2016-04-29: 400 mg via INTRAVENOUS
  Filled 2016-04-29: qty 20

## 2016-04-29 MED ORDER — SODIUM CHLORIDE 0.9 % IV SOLN
Freq: Once | INTRAVENOUS | Status: AC
  Administered 2016-04-29: 10:00:00 via INTRAVENOUS

## 2016-04-29 MED ORDER — SODIUM CHLORIDE 0.9 % IV SOLN
141.0000 mg/m2 | Freq: Once | INTRAVENOUS | Status: AC
  Administered 2016-04-29: 240 mg via INTRAVENOUS
  Filled 2016-04-29: qty 10

## 2016-04-29 MED ORDER — SODIUM CHLORIDE 0.9 % IV SOLN
Freq: Once | INTRAVENOUS | Status: AC
  Administered 2016-04-29: 11:00:00 via INTRAVENOUS
  Filled 2016-04-29: qty 250

## 2016-04-29 NOTE — Patient Instructions (Signed)
[facility] Discharge Instructions for Patients Receiving Chemotherapy  Today you received the following chemotherapy agents: Camptosar, Vetibix and Magnesium IV.  To help prevent nausea and vomiting after your treatment, we encourage you to take your nausea medication as directed. If you develop nausea and vomiting that is not controlled by your nausea medication, call the clinic.   BELOW ARE SYMPTOMS THAT SHOULD BE REPORTED IMMEDIATELY:  *FEVER GREATER THAN 100.5 F  *CHILLS WITH OR WITHOUT FEVER  NAUSEA AND VOMITING THAT IS NOT CONTROLLED WITH YOUR NAUSEA MEDICATION  *UNUSUAL SHORTNESS OF BREATH  *UNUSUAL BRUISING OR BLEEDING  TENDERNESS IN MOUTH AND THROAT WITH OR WITHOUT PRESENCE OF ULCERS  *URINARY PROBLEMS  *BOWEL PROBLEMS  UNUSUAL RASH Items with * indicate a potential emergency and should be followed up as soon as possible.  Feel free to call the clinic you have any questions or concerns. The clinic phone number is [phone].  Please show the [facility] at check-in to the Emergency Department and triage nurse.

## 2016-04-29 NOTE — Progress Notes (Signed)
[place]  [phone] Fax:([phone]) [phone]  Clinic follow up Note   Patient Care Team: [name], MD as PCP - General (Specialist) [name], CRNP as Nurse Practitioner (Nurse Practitioner) Truitt Merle, MD as Consulting Physician (Hematology) Ladene Artist, MD as Consulting Physician (Gastroenterology) 04/29/2016  CHIEF COMPLAINTS Follow up metastatic sigmoid colon cancer  Oncology History   He is a Metastatic colon cancer to liver   Staging form: Colon and Rectum, AJCC 7th Edition     Clinical: Stage Unknown ([place], NX, M1) - Unsigned      Cancer of sigmoid colon ([place])   10/28/2014 Tumor Marker AFP 3.2 CEA > 10,000 CA 19-9 12,929.6. tumor (-) KRAS and NRAS mutation.    11/10/2014 Imaging PET scan showed hypermetabolic mass in the sigmoid colon was noted metastasis in the retroperitoneum. Probable left adrenal and pulmonary metastasis, and diffuse liver metastasis.   11/21/2014 Initial Diagnosis Metastatic colon cancer to liver, lung, abd nodes and left adrenal gland. Diagnosis was made by liver biopsy.    11/30/2014 -  Chemotherapy First line chemo [id number], Panitumumab added from second cycle    12/28/2014 - 12/31/2014 Hospital Admission Was admitted for dehydration, neutropenia fever with UTI, and severe skin rash.   02/08/2015 - 02/12/2015 Hospital Admission He was admitted for upper GI bleeding, e.g. showed a gastric ulcer with clots, status post appendectomy injection. He also received a blood transfusion.   03/01/2015 Tumor Marker CEA 694, CA19.9 462   03/13/2015 Imaging CT CAP showed partial response, no new lesions.    03/15/2015 - 05/23/2015 Chemotherapy restart FOLFOX, held again on 8/9 due to GI bleeding    05/31/2015 - 06/02/2015 Hospital Admission He was admitted to [facility] in [place] due to upper GI bleeding, EGD showed gastric and dudenal ulcers    06/27/2015 - 06/29/2015 Hospital Admission he was admitted for dairrhea and pancolitis, c-diff and  stool cultures were negative, treated with antibiotics   07/12/2015 -  Chemotherapy panitumumab 73m/kg, every 2 weeks   11/08/2015 - 11/10/2015 Hospital Admission Pt was admitted for sepsis and hypotension, was found to have (+) influenza A and treated.    11/30/2015 Imaging  CT scans reviewed continued improvement in the liver metastasis, no other new lesions.     HISTORY OF PRESENTING ILLNESS:  Jason Reilly 70y.o. male is here because of abnormal CT findings, which is very suspicious for malignancy. [place] He came in with his son and an interpreter.  He has been feeling fatigued since two month ago. He is still able to do all ADLs. He otherwise denies any pain, bloating or nausea.  He lost about 20lbs in 3 month. His appetite is lower than before, eats less, no change of his bowl habits.  She denied any hematochezia or melana. Per his son, he has had some personality changes daily, irritable, slightly confused some time.  He was evaluated by his primary care physician. Lab test reviewed hepatitis B infection, which he did not know before, and elevated alkaline phosphatase, his liver function and the rest of the liver function was not remarkable. [place] was obtained on 07/22/2014, which showed diffusely abnormal liver with multiple echogenic lesions. CT of abdomen with and without contrast was done on 08/26/2014, which reviewed here at a medically with multiple large partially calcified hepatic masses consistent with metastatic disease. Mild retroperitoneal adenopathy with the largest node measuring 1.6  cm. And nonspecific 1.4 cm left adrenal nodule was also noticed. His tumor marker showed CEA greater than 10,000, CA 19-9 12,929, AFP 3.2 (normal). He was referred to [place] [facility] liver clinic and was evaluated by nurse practitioner [name]. Treatment for hepatitis B was not recommended based on his virus load.  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 He was evaluated by a cardiologist in 2014. He denies any significant dyspnea on exertion. No leg swollen.  CURRENT THERAPY: panitumumab 37m/kg, every 2 weeks, started on 07/12/2015, Irinotecan 1859mm2 every 2 weeks added on 09/19/2015, dose decreased to 16066m2 from cycle 2, and further decreased to 125m22m from cycle 10 due to diarrhea and anorexia, changed to 140mg17mfrom cycle 13.  He also receives IV magnesium 6g weekly   INTERIM HISTORY: Alonza Simsrns for follow-up and chemotherapy. He is accompanied by an interpreter. He is doing well overall. He denies any significant pain, diarrhea, nausea, or other complaints. He has moderate fatigue, able to function very well at home. He has good appetite, eating well, has gained 5 pounds in the past month. No fever or chills, his bowel movement is normal, no melena or hematochezia. No other signs of bleeding.  MEDICAL HISTORY:  Past Medical History  Diagnosis Date  . Tachycardia   . Abnormal EKG   . Hypertension   . Non-ischemic cardiomyopathy (HCC) [place] Hepatitis B   . H. pylori infection   . Gastric ulcer   . Metastatic colon cancer to liver (HCC) [place] Diabetes mellitus without complication (HCC) [place] metformin    SURGICAL HISTORY: Past Surgical History  Procedure Laterality Date  . Nuclear stress test  03/03/2013    High risk - consistent with nonischemic cardiomyopathy  . Left and right heart catheterization with coronary angiogram N/A 03/29/2013    Procedure: LEFT AND RIGHT HEART CATHETERIZATION WITH CORONARY ANGIOGRAM;  Surgeon: KennePixie Casino  [facility];  Service: Cardiovascular;  Laterality: N/A;  . Esophagogastroduodenoscopy N/A 02/08/2015    Procedure: ESOPHAGOGASTRODUODENOSCOPY (EGD);  Surgeon: MalcoLadene Artist  [facility];  Service: Endoscopy;  Laterality: N/A;  . Esophagogastroduodenoscopy (egd) with propofol N/A  08/15/2015    Procedure: ESOPHAGOGASTRODUODENOSCOPY (EGD) WITH PROPOFOL;  Surgeon: MalcoLadene Artist  Location[facility];  Service: Endoscopy;  Laterality: N/A;    SOCIAL HISTORY: Social History   Social History  . Marital Status: Married    Spouse Name: N/A  . Number of Children: N/A  . Years of Education: N/A   Occupational History  . Not on file.   Social History Main Topics  . Smoking status: Former Smoker    Types: Cigarettes    Quit date: 11/07/2008  . Smokeless tobacco: Never Used  . Alcohol Use: Yes     Comment: occasional  . Drug Use: No  . Sexual Activity: No   Other Topics Concern  . Not on file   Social History Narrative   Married   Enjoys walking   Has lived in [place]    FAMILY HISTORY: No family history of liver disease or malignancy.  ALLERGIES:  has No Known Allergies.  MEDICATIONS:  Current Outpatient Prescriptions on File Prior to Visit  Medication Sig Dispense Refill  . clindamycin (CLINDAGEL) 1 % gel Apply topically 2 (two) times daily. 30 g 2  . diphenoxylate-atropine (LOMOTIL) 2.5-0.025 MG tablet Take 1-2 tablets by mouth 4 (four) times  daily as needed for diarrhea or loose stools. 30 tablet 0  . folic acid (FOLVITE) 1 MG tablet Take 1 tablet (1 mg total) by mouth daily. 30 tablet 3  . hydrocortisone 1 % ointment Apply 1 application topically 2 (two) times daily. 56 g 0  . lidocaine-prilocaine (EMLA) cream Apply 1 application topically as needed. Apply to [facility] cath at least one hour before needle stick. 30 g 2  . lisinopril-hydrochlorothiazide (PRINZIDE,ZESTORETIC) 10-12.5 MG tablet     . loperamide (IMODIUM) 2 MG capsule Take 2 capsules (4 mg total) by mouth 4 (four) times daily as needed for diarrhea or loose stools (NO MORE THAN 8 TABLETS PER DAY.). 60 capsule 3  . magic mouthwash SOLN Take 5 mLs by mouth 4 (four) times daily. 120 mL 1  . magnesium oxide (MAG-OX) 400 (241.3 Mg) MG tablet Take 1 tablet (400 mg total) by mouth 3  (three) times daily. 60 tablet 3  . metFORMIN (GLUCOPHAGE) 500 MG tablet Take 1 tablet (500 mg total) by mouth 2 (two) times daily with a meal. 60 tablet 0  . nadolol (CORGARD) 20 MG tablet Take 1 tablet (20 mg total) by mouth 2 (two) times daily. THIS IS A ONE TIME ORDER.  FUTURE REFILLS NEED TO BE DONE BY PRIMARY MD. 60 tablet 1  . ondansetron (ZOFRAN) 8 MG tablet Take 1 tablet (8 mg total) by mouth every 8 (eight) hours as needed for nausea or vomiting. 45 tablet 2  . pantoprazole (PROTONIX) 40 MG tablet Take 1 tablet (40 mg total) by mouth daily. 30 tablet 2  . prochlorperazine (COMPAZINE) 10 MG tablet Take 1 tablet (10 mg total) by mouth every 6 (six) hours as needed for nausea or vomiting. 30 tablet 2   Current Facility-Administered Medications on File Prior to Visit  Medication Dose Route Frequency Provider Last Rate Last Dose  . 0.9 %  sodium chloride infusion   Intravenous Once Truitt Merle, MD      . sodium chloride 0.9 % injection 10 mL  10 mL Intracatheter PRN Truitt Merle, MD   10 mL at 10/03/15 1705  . sodium chloride 0.9 % injection 10 mL  10 mL Intravenous PRN Truitt Merle, MD   10 mL at 04/29/16 0825  ;   REVIEW OF SYSTEMS:   Constitutional: Denies fevers, chills or abnormal night sweats, (+) fatigue  Eyes: Denies blurriness of vision, double vision or watery eyes Ears, nose, mouth, throat, and face: Denies mucositis or sore throat Respiratory: Denies cough, dyspnea or wheezes Cardiovascular: Denies palpitation, chest discomfort or lower extremity swelling Gastrointestinal: Denies nausea, heartburn or change in bowel habits Skin: Denies abnormal skin rashes Lymphatics: Denies new lymphadenopathy or easy bruising Neurological:Denies numbness, tingling or new weaknesses Behavioral/Psych: Mood is stable, no new changes, (+) insomnia All other systems were reviewed with the patient and are negative.  PHYSICAL EXAMINATION: BP 121/66 mmHg  Pulse 88  Temp(Src) 98.3 F (36.8 C) (Oral)   Resp 18  Ht '5\' 5"'  (1.651 m)  Wt 145 lb 9.6 oz (66.044 kg)  BMI 24.23 kg/m2  SpO2 100%  ECOG PERFORMANCE STATUS: 1 Vital sign were taken at in the infusion room, within normal limits. GENERAL:alert, no distress and comfortable SKIN: (+) Dry skin,  no skin ulcer, (+) scatter skin rashes on his neck and upper chest, some are resolving with skin pigmentation. No discharge. EYES: normal, conjunctiva are pink and non-injected, sclera clear OROPHARYNX:no exudate, no erythema and lips, buccal mucosa, and tongue with mild  discoloration at the tip.  NECK: supple, thyroid normal size, non-tender, without nodularity LYMPH:  no palpable lymphadenopathy in the cervical, axillary or inguinal LUNGS: clear to auscultation and percussion with normal breathing effort, no rales   HEART: regular rate & rhythm and no murmurs and no lower extremity edema ABDOMEN:abdomen soft, non-tender, no hepatomegaly, no splenomegaly and normal bowel sounds Musculoskeletal:no cyanosis of digits and no clubbing  PSYCH: alert & oriented x 3 with fluent speech NEURO: no focal motor/sensory deficits  LABORATORY DATA:  I have reviewed the data as listed CBC Latest Ref Rng 04/29/2016 04/16/2016 04/09/2016  WBC 4.0 - 10.3 10e3/uL 3.3(L) 3.8(L) 3.3(L)  Hemoglobin 13.0 - 17.1 g/dL 8.4(L) 8.7(L) 8.5(L)  Hematocrit 38.4 - 49.9 % 25.8(L) 26.6(L) 25.9(L)  Platelets 140 - 400 10e3/uL 120(L) 131(L) 160    CMP Latest Ref Rng 04/29/2016 04/16/2016 04/02/2016  Glucose 70 - 140 mg/dl 201(H) 174(H) 118  BUN 7.0 - 26.0 mg/dL 10.3 16.0 9.4  Creatinine 0.7 - 1.3 mg/dL 0.8 0.9 0.9  Sodium 136 - 145 mEq/L 141 139 142  Potassium 3.5 - 5.1 mEq/L 4.3 4.0 4.2  CO2 22 - 29 mEq/L '22 25 27  ' Calcium 8.4 - 10.4 mg/dL 8.5 8.7 8.7  Total Protein 6.4 - 8.3 g/dL 7.0 7.0 7.3  Total Bilirubin 0.20 - 1.20 mg/dL 0.50 0.63 0.48  Alkaline Phos 40 - 150 U/L 161(H) 160(H) 190(H)  AST 5 - 34 U/L '28 30 30  ' ALT 0 - 55 U/L '24 26 22   ' Mag pending  ANC 2.2 today    INITIAL tumor markers AFP 3.2 CEA > 10,000 CA 19-9 12,929.6  CEA  Status: Finalresult Visible to patient:  Not Released Nextappt: Today at 08:00 AM in [facility] 4) Dx:  Cancer of sigmoid colon ([place])              Ref Range 2wk ago  47moago  26mogo  89m45moo     CEA 0.0 - 4.7 ng/mL 289.7 (H) 221.4 (H)CM 155.8 (H)CM 110.6 (H)CM   Comments:           Roche ECLIA methodology    Nonsmokers <3.9             Pathology report  Liver, needle/core biopsy - METASTATIC ADENOCARCINOMA, SEE COMMENT. Microscopic Comment The adenocarcinoma demonstrates the following immunophenotype: Cytokeratin 7 - negative expression. Cytokeratin 20 - strong diffuse expression. CD2 - strong diffuse expression. Overall the morphology and immunophenotype are that of metastatic adenocarcinoma primary to colorectum. The recent nuclear medicine scan demonstrating sigmoid mass with associated liver masses is noted.  [facility] test result:    RADIOGRAPHIC STUDIES: I have personally reviewed the outside CT scan image with patient and his son.   CT chest, abdomen and pelvis with IV contrast oN  02/29/2016 IMPRESSION: 1. Liver metastasis are improved from previous exam. No new or progressive liver lesions identified 2. Similar appearance of right adrenal gland metastasis. 3. There are 2 small nodules within the left lung, which are new from previous exam and may represent early pulmonary metastasis. 4. Aortic atherosclerosis and multi vessel coronary artery calcification.   ASSESSMENT & PLAN:  [age]ar old [place], with past history of hypertension and dilated nonischemic gammopathy with EF 25%, no clinical signs of heart failure, who was found to have hepatitis B infection lately, and multiple liver lesions on the CT scan. He has extremely high CEA and CA 19-9 levels. PET scan reviewed a hypermetabolic sigmoid colon mass, diffuse  liver  metastasis, probable lung and adrenal gland metastasis.  1. Metastatic sigmoid colon cancer, with diffuse liver, lungs, node and left adrenal gland metastases. KRAS/NRAS wild type, MSI-stable -Liver biopsy showed metastatic adenocarcinoma. His tumor were strongly positive for CK20 and CD2, consistent with primary colorectal primary. KRAS and NRAS mutations were not detected.  -Pt understands that this is incurable cancer, and he has very high disease burden and overall prognosis is poor. The treatment goal is palliative -He had excellent response to first-line FOLFOX, but treatment was complicated with GI bleeding and neutropenic fever, resolved now. -he is currently on second line chemotherapy with irritecan and panitumumab, tolerating well  - I reviewed his restaging CT scan from 02/29/2016, which showed a continuous responding in liver metastasis, no other new lesions. -here to severe diarrhea from Irinotecan, dose has been reduced from 212m (1658mm2) to 20064m125m68m, rounded), which has been increased to 240 mg (140mg75m from next cycle 13 and he tolerated well, we'll continue at the current dose. -His tumor marker CEA has slightly increased lately, we'll continue monitoring. -He is clinical doing well, lab reviewed, adequate for treatment, we'll continue chemotherapy today.  -restaging scan in early Aug   2. Grade 1-2 skin rashes, stable  -Secondary to panitumumab, slightly worse today -continue hydrocortisone 2.5%, and clindamycin gel 1%  twice daily as needed  -He knows to avoid sun exposure, and call me if it gets worse.  3. Gastric ulcer with significant GI bleeding in April and Aug 2016 -He is on PPI, continue once daily  -Repeat his EGD on 08/15/2015 showed near complete healing of his gastric ulcer -continue Nadolol 20mg 12m per Dr. Stark Fuller Planncourage him to follow-up with Dr. Stark Fuller Planype 2 DM  -HbA1c was 8.5, blood glucose not well controlled, he run out of metformin    -I encourage him to follow-up with his primary care physician Dr. WilliaJimmye Normancall his office for metformin refill today   5. HTN, Dilated nonischemic ischemia cardiomyopathy with EF 25% -He is clinically doing well without symptoms of CHF. However this is probably going to impact his chemotherapy.Will try to avoid cardiotoxic chemotherapy agent and avoid fluid overload during chemotherapy. -Continue follow-up with cardiology. -will hold on his lisinopril-HCTZ for now, his BP has been normal lately   6 Hepatitis B carrier, with mild portal hypertension  -Per liver clinic, no need for treatment. Follow-up with Dr. StarksSilvio PateMalnutrition -I encouraged him to eat more, and take supplements as needed. -improved, gained weight back after I reduce his chemotherapy dose  -follow up with Dietitian   8. Anemia secondary to GI bleeding, iron deficiency and chemo  -Repeat lab on 06/06/2015 showed ferritin 117, serum iron 24, saturation 8%, which supports iron deficiency -He received IV Feraheme again in Aug 2016 after GI bleeding  -Repeat a ferritin was 273 on 10/03/2015, much improved  -he received iv feraheme again on 11/27/2015 and 12/04/2015, but anemia did not improve much. -His anemia has been getting worse lately, probably related to chemotherapy, we'll close monitor any signs of GI bleeding. -Blood transfusion if hemoglobin less than 8 or symptomatic anemia with hemoglobin 8-9 -Hemoglobin 8.4 today, not symptomatic, will continue monitoring  11. hypomagnesemia  -secondary to panitumumab -he receives IV mag 6 g every week -He is taking magnesium pill 2 tablets 3 times a day -follow up weekly    Plan -Lab results reviewed, adequate for treatment, we'll proceed chemotherapy and panitumumab today and continue  every 2 weeks. He tolerated irritation 240 mg (149m/m2) well, we'll continue the current dose. -magnesium infusion weekly -I'll see him back in 2 weeks,and order restaging CT on  next visit   All questions were answered. The patient knows to call the clinic with any problems, questions or concerns.  I spent 25 minutes counseling the patient face to face. The total time spent in the appointment was 30 minutes and more than 50% was on counseling.     FTruitt Merle MD 04/29/2016

## 2016-04-29 NOTE — Patient Instructions (Signed)

## 2016-04-29 NOTE — Progress Notes (Signed)
Writer spoke with Dr Burr Medico: Mr Streetman will have Camptosar/Vectibix and Magnesium 6gm today. Ferriheme is not scheduled for today. RN did leave message with Dr Ernestina Penna nurse to put in specific date for iron infusion. RN discussed importance of Mag infusion with patient via interpretor. VSS, will cont to monitor.

## 2016-05-06 ENCOUNTER — Other Ambulatory Visit (HOSPITAL_BASED_OUTPATIENT_CLINIC_OR_DEPARTMENT_OTHER): Payer: PPO

## 2016-05-06 ENCOUNTER — Other Ambulatory Visit: Payer: Self-pay | Admitting: Hematology

## 2016-05-06 ENCOUNTER — Ambulatory Visit: Payer: PPO

## 2016-05-06 ENCOUNTER — Ambulatory Visit (HOSPITAL_BASED_OUTPATIENT_CLINIC_OR_DEPARTMENT_OTHER): Payer: PPO

## 2016-05-06 VITALS — BP 93/64 | HR 106 | Temp 98.2°F | Resp 18

## 2016-05-06 DIAGNOSIS — D500 Iron deficiency anemia secondary to blood loss (chronic): Secondary | ICD-10-CM

## 2016-05-06 DIAGNOSIS — C187 Malignant neoplasm of sigmoid colon: Secondary | ICD-10-CM

## 2016-05-06 DIAGNOSIS — Z95828 Presence of other vascular implants and grafts: Secondary | ICD-10-CM

## 2016-05-06 LAB — CBC WITH DIFFERENTIAL/PLATELET
BASO%: 0.7 % (ref 0.0–2.0)
BASOS ABS: 0 10*3/uL (ref 0.0–0.1)
EOS ABS: 0.1 10*3/uL (ref 0.0–0.5)
EOS%: 3.2 % (ref 0.0–7.0)
HEMATOCRIT: 27.8 % — AB (ref 38.4–49.9)
HEMOGLOBIN: 9.3 g/dL — AB (ref 13.0–17.1)
LYMPH#: 1.1 10*3/uL (ref 0.9–3.3)
LYMPH%: 28.1 % (ref 14.0–49.0)
MCH: 28.8 pg (ref 27.2–33.4)
MCHC: 33.6 g/dL (ref 32.0–36.0)
MCV: 85.8 fL (ref 79.3–98.0)
MONO#: 0.4 10*3/uL (ref 0.1–0.9)
MONO%: 10.7 % (ref 0.0–14.0)
NEUT#: 2.3 10*3/uL (ref 1.5–6.5)
NEUT%: 57.3 % (ref 39.0–75.0)
PLATELETS: 192 10*3/uL (ref 140–400)
RBC: 3.24 10*6/uL — ABNORMAL LOW (ref 4.20–5.82)
RDW: 16.8 % — AB (ref 11.0–14.6)
WBC: 4.1 10*3/uL (ref 4.0–10.3)

## 2016-05-06 LAB — MAGNESIUM: MAGNESIUM: 1 mg/dL — AB (ref 1.5–2.5)

## 2016-05-06 LAB — FERRITIN: Ferritin: 898 ng/ml — ABNORMAL HIGH (ref 22–316)

## 2016-05-06 MED ORDER — HEPARIN SOD (PORK) LOCK FLUSH 100 UNIT/ML IV SOLN
500.0000 [IU] | Freq: Once | INTRAVENOUS | Status: AC | PRN
  Administered 2016-05-06: 500 [IU] via INTRAVENOUS
  Filled 2016-05-06: qty 5

## 2016-05-06 MED ORDER — SODIUM CHLORIDE 0.9 % IV SOLN
4.0000 g | Freq: Once | INTRAVENOUS | Status: AC
  Administered 2016-05-06: 4 g via INTRAVENOUS
  Filled 2016-05-06: qty 8

## 2016-05-06 MED ORDER — SODIUM CHLORIDE 0.9 % IJ SOLN
10.0000 mL | INTRAMUSCULAR | Status: DC | PRN
  Administered 2016-05-06: 10 mL via INTRAVENOUS
  Filled 2016-05-06: qty 10

## 2016-05-07 LAB — CEA: CEA: 566.1 ng/mL — ABNORMAL HIGH (ref 0.0–4.7)

## 2016-05-13 ENCOUNTER — Ambulatory Visit: Payer: PPO

## 2016-05-13 ENCOUNTER — Encounter: Payer: Self-pay | Admitting: Hematology

## 2016-05-13 ENCOUNTER — Ambulatory Visit (HOSPITAL_BASED_OUTPATIENT_CLINIC_OR_DEPARTMENT_OTHER): Payer: PPO

## 2016-05-13 ENCOUNTER — Other Ambulatory Visit (HOSPITAL_BASED_OUTPATIENT_CLINIC_OR_DEPARTMENT_OTHER): Payer: PPO

## 2016-05-13 ENCOUNTER — Telehealth: Payer: Self-pay | Admitting: Hematology

## 2016-05-13 ENCOUNTER — Ambulatory Visit (HOSPITAL_BASED_OUTPATIENT_CLINIC_OR_DEPARTMENT_OTHER): Payer: PPO | Admitting: Hematology

## 2016-05-13 VITALS — BP 113/74 | HR 88 | Temp 98.6°F | Resp 18 | Ht 65.0 in | Wt 144.6 lb

## 2016-05-13 DIAGNOSIS — C787 Secondary malignant neoplasm of liver and intrahepatic bile duct: Secondary | ICD-10-CM

## 2016-05-13 DIAGNOSIS — Z5111 Encounter for antineoplastic chemotherapy: Secondary | ICD-10-CM

## 2016-05-13 DIAGNOSIS — D500 Iron deficiency anemia secondary to blood loss (chronic): Secondary | ICD-10-CM

## 2016-05-13 DIAGNOSIS — E46 Unspecified protein-calorie malnutrition: Secondary | ICD-10-CM

## 2016-05-13 DIAGNOSIS — C187 Malignant neoplasm of sigmoid colon: Secondary | ICD-10-CM

## 2016-05-13 DIAGNOSIS — I10 Essential (primary) hypertension: Secondary | ICD-10-CM

## 2016-05-13 DIAGNOSIS — D6481 Anemia due to antineoplastic chemotherapy: Secondary | ICD-10-CM

## 2016-05-13 DIAGNOSIS — Z95828 Presence of other vascular implants and grafts: Secondary | ICD-10-CM

## 2016-05-13 DIAGNOSIS — E119 Type 2 diabetes mellitus without complications: Secondary | ICD-10-CM | POA: Diagnosis not present

## 2016-05-13 DIAGNOSIS — Z5112 Encounter for antineoplastic immunotherapy: Secondary | ICD-10-CM

## 2016-05-13 DIAGNOSIS — I5042 Chronic combined systolic (congestive) and diastolic (congestive) heart failure: Secondary | ICD-10-CM

## 2016-05-13 LAB — CBC WITH DIFFERENTIAL/PLATELET
BASO%: 0.5 % (ref 0.0–2.0)
Basophils Absolute: 0 10*3/uL (ref 0.0–0.1)
EOS%: 3.2 % (ref 0.0–7.0)
Eosinophils Absolute: 0.1 10*3/uL (ref 0.0–0.5)
HEMATOCRIT: 25.1 % — AB (ref 38.4–49.9)
HEMOGLOBIN: 8.4 g/dL — AB (ref 13.0–17.1)
LYMPH#: 0.8 10*3/uL — AB (ref 0.9–3.3)
LYMPH%: 19.5 % (ref 14.0–49.0)
MCH: 28.4 pg (ref 27.2–33.4)
MCHC: 33.5 g/dL (ref 32.0–36.0)
MCV: 84.8 fL (ref 79.3–98.0)
MONO#: 0.3 10*3/uL (ref 0.1–0.9)
MONO%: 8.1 % (ref 0.0–14.0)
NEUT%: 68.7 % (ref 39.0–75.0)
NEUTROS ABS: 2.8 10*3/uL (ref 1.5–6.5)
NRBC: 0 % (ref 0–0)
Platelets: 132 10*3/uL — ABNORMAL LOW (ref 140–400)
RBC: 2.96 10*6/uL — ABNORMAL LOW (ref 4.20–5.82)
RDW: 16 % — AB (ref 11.0–14.6)
WBC: 4.1 10*3/uL (ref 4.0–10.3)

## 2016-05-13 LAB — COMPREHENSIVE METABOLIC PANEL
ALBUMIN: 3.4 g/dL — AB (ref 3.5–5.0)
ALK PHOS: 178 U/L — AB (ref 40–150)
ALT: 27 U/L (ref 0–55)
AST: 30 U/L (ref 5–34)
Anion Gap: 8 mEq/L (ref 3–11)
BILIRUBIN TOTAL: 0.5 mg/dL (ref 0.20–1.20)
BUN: 5.9 mg/dL — AB (ref 7.0–26.0)
CO2: 25 meq/L (ref 22–29)
Calcium: 8.5 mg/dL (ref 8.4–10.4)
Chloride: 107 mEq/L (ref 98–109)
Creatinine: 0.8 mg/dL (ref 0.7–1.3)
EGFR: >90 mL/min/{1.73_m2} (ref >90)
GLUCOSE: 173 mg/dL — AB (ref 70–140)
Potassium: 4.3 mEq/L (ref 3.5–5.1)
SODIUM: 140 meq/L (ref 136–145)
TOTAL PROTEIN: 7 g/dL (ref 6.4–8.3)

## 2016-05-13 LAB — MAGNESIUM: MAGNESIUM: 0.8 mg/dL — AB (ref 1.5–2.5)

## 2016-05-13 MED ORDER — HEPARIN SOD (PORK) LOCK FLUSH 100 UNIT/ML IV SOLN
500.0000 [IU] | Freq: Once | INTRAVENOUS | Status: AC | PRN
  Administered 2016-05-13: 500 [IU]
  Filled 2016-05-13: qty 5

## 2016-05-13 MED ORDER — ATROPINE SULFATE 1 MG/ML IJ SOLN
0.5000 mg | Freq: Once | INTRAMUSCULAR | Status: AC | PRN
  Administered 2016-05-13: 0.5 mg via INTRAVENOUS

## 2016-05-13 MED ORDER — SODIUM CHLORIDE 0.9% FLUSH
10.0000 mL | INTRAVENOUS | Status: DC | PRN
  Administered 2016-05-13: 10 mL via INTRAVENOUS
  Filled 2016-05-13: qty 10

## 2016-05-13 MED ORDER — SODIUM CHLORIDE 0.9 % IV SOLN
Freq: Once | INTRAVENOUS | Status: AC
  Administered 2016-05-13: 12:00:00 via INTRAVENOUS
  Filled 2016-05-13: qty 4

## 2016-05-13 MED ORDER — SODIUM CHLORIDE 0.9 % IJ SOLN
10.0000 mL | INTRAMUSCULAR | Status: DC | PRN
  Administered 2016-05-13: 10 mL
  Filled 2016-05-13: qty 10

## 2016-05-13 MED ORDER — SODIUM CHLORIDE 0.9 % IV SOLN
141.0000 mg/m2 | Freq: Once | INTRAVENOUS | Status: AC
  Administered 2016-05-13: 240 mg via INTRAVENOUS
  Filled 2016-05-13: qty 10

## 2016-05-13 MED ORDER — ATROPINE SULFATE 1 MG/ML IJ SOLN
INTRAMUSCULAR | Status: AC
  Filled 2016-05-13: qty 1

## 2016-05-13 MED ORDER — SODIUM CHLORIDE 0.9 % IV SOLN
Freq: Once | INTRAVENOUS | Status: AC
  Administered 2016-05-13: 12:00:00 via INTRAVENOUS

## 2016-05-13 MED ORDER — SODIUM CHLORIDE 0.9 % IV SOLN
6.2000 mg/kg | Freq: Once | INTRAVENOUS | Status: AC
  Administered 2016-05-13: 400 mg via INTRAVENOUS
  Filled 2016-05-13: qty 20

## 2016-05-13 NOTE — Patient Instructions (Signed)

## 2016-05-13 NOTE — Progress Notes (Signed)
[place]  Telephone:(336) [phone] Fax:(336) [phone]  Clinic follow up Note   Patient Care Team: Jason Junior, MD as PCP - General (Specialist) Jason Reilly, CRNP as Nurse Practitioner (Nurse Practitioner) Jason Merle, MD as Consulting Physician (Hematology) Jason Artist, MD as Consulting Physician (Gastroenterology) 05/13/2016  CHIEF COMPLAINTS Follow up metastatic sigmoid colon cancer  Oncology History   He is a Metastatic colon cancer to liver   Staging form: Colon and Rectum, AJCC 7th Edition     Clinical: Stage Unknown ([place], NX, M1) - Unsigned      Cancer of sigmoid colon ([place])   10/28/2014 Tumor Marker AFP 3.2 CEA > 10,000 CA 19-9 12,929.6. tumor (-) KRAS and NRAS mutation.    11/10/2014 Imaging PET scan showed hypermetabolic mass in the sigmoid colon was noted metastasis in the retroperitoneum. Probable left adrenal and pulmonary metastasis, and diffuse liver metastasis.   11/21/2014 Initial Diagnosis Metastatic colon cancer to liver, lung, abd nodes and left adrenal gland. Diagnosis was made by liver biopsy.    11/30/2014 -  Chemotherapy First line chemo [id number], Panitumumab added from second cycle    12/28/2014 - 12/31/2014 Hospital Admission Was admitted for dehydration, neutropenia fever with UTI, and severe skin rash.   02/08/2015 - 02/12/2015 Hospital Admission He was admitted for upper GI bleeding, e.g. showed a gastric ulcer with clots, status post appendectomy injection. He also received a blood transfusion.   03/01/2015 Tumor Marker CEA 694, CA19.9 462   03/13/2015 Imaging CT CAP showed partial response, no new lesions.    03/15/2015 - 05/23/2015 Chemotherapy restart FOLFOX, held again on 8/9 due to GI bleeding    05/31/2015 - 06/02/2015 Hospital Admission He was admitted to [facility] in [place] due to upper GI bleeding, EGD showed gastric and dudenal ulcers    06/27/2015 - 06/29/2015 Hospital Admission he was admitted for dairrhea and pancolitis, c-diff and  stool cultures were negative, treated with antibiotics   07/12/2015 -  Chemotherapy panitumumab 49m/kg, every 2 weeks   11/08/2015 - 11/10/2015 Hospital Admission Pt was admitted for sepsis and hypotension, was found to have (+) influenza A and treated.    11/30/2015 Imaging  CT scans reviewed continued improvement in the liver metastasis, no other new lesions.     HISTORY OF PRESENTING ILLNESS:  [name] 70y.o. male is here because of abnormal CT findings, which is very suspicious for malignancy. [place] He came in with his son and an interpreter.  He has been feeling fatigued since two month ago. He is still able to do all ADLs. He otherwise denies any pain, bloating or nausea.  He lost about 20lbs in 3 month. His appetite is lower than before, eats less, no change of his bowl habits.  She denied any hematochezia or melana. Per his son, he has had some personality changes daily, irritable, slightly confused some time.  He was evaluated by his primary care physician. Lab test reviewed hepatitis B infection, which he did not know before, and elevated alkaline phosphatase, his liver function and the rest of the liver function was not remarkable. [place] was obtained on 07/22/2014, which showed diffusely abnormal liver with multiple echogenic lesions. CT of abdomen with and without contrast was done on 08/26/2014, which reviewed here at a medically with multiple large partially calcified hepatic masses consistent with metastatic disease. Mild retroperitoneal adenopathy with the largest node measuring 1.6  cm. And nonspecific 1.4 cm left adrenal nodule was also noticed. His tumor marker showed CEA greater than 10,000, CA 19-9 12,929, AFP 3.2 (normal). He was referred to [facility] liver clinic and was evaluated by nurse practitioner Jason Reilly. Treatment for hepatitis B was not recommended based on his virus load.  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 He was evaluated by a cardiologist in 2014. He denies any significant dyspnea on exertion. No leg swollen.  CURRENT THERAPY: panitumumab 28m/kg, every 2 weeks, started on 07/12/2015, Irinotecan 1812mm2 every 2 weeks added on 09/19/2015, dose decreased to 16031m2 from cycle 2, and further decreased to 125m39m from cycle 10 due to diarrhea and anorexia, changed to 140mg49mfrom cycle 13.  He also receives IV magnesium 6g weekly   INTERIM HISTORY: Jason Reilly Jason Reilly for follow-up and chemotherapy. He is accompanied by his son. He is doing well overall, has good appetite and an energy level, denies any significant pain, nausea or other symptoms. His weight has been stable lately. He has been tolerating chemotherapy well, no significant diarrhea or other issues.  MEDICAL HISTORY:  Past Medical History  Diagnosis Date  . Tachycardia   . Abnormal EKG   . Hypertension   . Non-ischemic cardiomyopathy (HCC) [place] Hepatitis B   . H. pylori infection   . Gastric ulcer   . Metastatic colon cancer to liver (HCC) [place] Diabetes mellitus without complication (HCC) [place] metformin    SURGICAL HISTORY: Past Surgical History  Procedure Laterality Date  . Nuclear stress test  03/03/2013    High risk - consistent with nonischemic cardiomyopathy  . Left and right heart catheterization with coronary angiogram N/A 03/29/2013    Procedure: LEFT AND RIGHT HEART CATHETERIZATION WITH CORONARY ANGIOGRAM;  Surgeon: KennePixie Casino  [facility];  Service: Cardiovascular;  Laterality: N/A;  . Esophagogastroduodenoscopy N/A 02/08/2015    Procedure: ESOPHAGOGASTRODUODENOSCOPY (EGD);  Surgeon: MalcoLadene Reilly  [facility];  Service: Endoscopy;  Laterality: N/A;  . Esophagogastroduodenoscopy (egd) with propofol N/A 08/15/2015    Procedure: ESOPHAGOGASTRODUODENOSCOPY (EGD) WITH PROPOFOL;  Surgeon: MalcoLadene Reilly  Location:  [facility];  Service: Endoscopy;  Laterality: N/A;    SOCIAL HISTORY: Social History   Social History  . Marital Status: Married    Spouse Name: N/A  . Number of Children: N/A  . Years of Education: N/A   Occupational History  . Not on file.   Social History Main Topics  . Smoking status: Former Smoker    Types: Cigarettes    Quit date: 11/07/2008  . Smokeless tobacco: Never Used  . Alcohol Use: Yes     Comment: occasional  . Drug Use: No  . Sexual Activity: No   Other Topics Concern  . Not on file   Social History Narrative   Married   Enjoys walking   Has lived in [place]    FAMILY HISTORY: No family history of liver disease or malignancy.  ALLERGIES:  has No Known Allergies.  MEDICATIONS:  Current Outpatient Prescriptions on File Prior to Visit  Medication Sig Dispense Refill  . clindamycin (CLINDAGEL) 1 % gel Apply topically 2 (two) times daily. 30 g 2  . diphenoxylate-atropine (LOMOTIL) 2.5-0.025 MG tablet Take 1-2 tablets by mouth 4 (four) times daily as needed for diarrhea or loose stools. 30 tablet 0  . folic acid (FOLVITE) 1 MG tablet Take  1 tablet (1 mg total) by mouth daily. 30 tablet 3  . hydrocortisone 1 % ointment Apply 1 application topically 2 (two) times daily. 56 g 0  . lidocaine-prilocaine (EMLA) cream Apply 1 application topically as needed. Apply to [facility] cath at least one hour before needle stick. 30 g 2  . lisinopril-hydrochlorothiazide (PRINZIDE,ZESTORETIC) 10-12.5 MG tablet     . loperamide (IMODIUM) 2 MG capsule Take 2 capsules (4 mg total) by mouth 4 (four) times daily as needed for diarrhea or loose stools (NO MORE THAN 8 TABLETS PER DAY.). 60 capsule 3  . magic mouthwash SOLN Take 5 mLs by mouth 4 (four) times daily. 120 mL 1  . magnesium oxide (MAG-OX) 400 (241.3 Mg) MG tablet Take 1 tablet (400 mg total) by mouth 3 (three) times daily. 60 tablet 3  . metFORMIN (GLUCOPHAGE) 500 MG tablet Take 1 tablet (500 mg total) by mouth 2  (two) times daily with a meal. 60 tablet 0  . nadolol (CORGARD) 20 MG tablet Take 1 tablet (20 mg total) by mouth 2 (two) times daily. THIS IS A ONE TIME ORDER.  FUTURE REFILLS NEED TO BE DONE BY PRIMARY MD. 60 tablet 1  . ondansetron (ZOFRAN) 8 MG tablet Take 1 tablet (8 mg total) by mouth every 8 (eight) hours as needed for nausea or vomiting. 45 tablet 2  . pantoprazole (PROTONIX) 40 MG tablet Take 1 tablet (40 mg total) by mouth daily. 30 tablet 2  . prochlorperazine (COMPAZINE) 10 MG tablet Take 1 tablet (10 mg total) by mouth every 6 (six) hours as needed for nausea or vomiting. 30 tablet 2   Current Facility-Administered Medications on File Prior to Visit  Medication Dose Route Frequency Provider Last Rate Last Dose  . 0.9 %  sodium chloride infusion   Intravenous Once Jason Merle, MD      . sodium chloride 0.9 % injection 10 mL  10 mL Intracatheter PRN Jason Merle, MD   10 mL at 10/03/15 1705  ;   REVIEW OF SYSTEMS:   Constitutional: Denies fevers, chills or abnormal night sweats, (+) fatigue  Eyes: Denies blurriness of vision, double vision or watery eyes Ears, nose, mouth, throat, and face: Denies mucositis or sore throat Respiratory: Denies cough, dyspnea or wheezes Cardiovascular: Denies palpitation, chest discomfort or lower extremity swelling Gastrointestinal: Denies nausea, heartburn or change in bowel habits Skin: Denies abnormal skin rashes Lymphatics: Denies new lymphadenopathy or easy bruising Neurological:Denies numbness, tingling or new weaknesses Behavioral/Psych: Mood is stable, no new changes, (+) insomnia All other systems were reviewed with the patient and are negative.  PHYSICAL EXAMINATION: BP 113/74 mmHg  Pulse 88  Temp(Src) 98.6 F (37 C) (Oral)  Resp 18  Ht 5' 5" (1.651 m)  Wt 144 lb 9.6 oz (65.59 kg)  BMI 24.06 kg/m2  SpO2 100%  ECOG PERFORMANCE STATUS: 1 Vital sign were taken at in the infusion room, within normal limits. GENERAL:alert, no distress and  comfortable SKIN: (+) Dry skin,  no skin ulcer, (+) scatter skin rashes on his neck and upper chest, some are resolving with skin pigmentation. No discharge. EYES: normal, conjunctiva are pink and non-injected, sclera clear OROPHARYNX:no exudate, no erythema and lips, buccal mucosa, and tongue with mild discoloration at the tip.  NECK: supple, thyroid normal size, non-tender, without nodularity LYMPH:  no palpable lymphadenopathy in the cervical, axillary or inguinal LUNGS: clear to auscultation and percussion with normal breathing effort, no rales   HEART: regular rate & rhythm and  no murmurs and no lower extremity edema ABDOMEN:abdomen soft, non-tender, no hepatomegaly, no splenomegaly and normal bowel sounds Musculoskeletal:no cyanosis of digits and no clubbing  PSYCH: alert & oriented x 3 with fluent speech NEURO: no focal motor/sensory deficits  LABORATORY DATA:  I have reviewed the data as listed CBC Latest Ref Rng 05/13/2016 05/06/2016 04/29/2016  WBC 4.0 - 10.3 10e3/uL 4.1 4.1 3.3(L)  Hemoglobin 13.0 - 17.1 g/dL 8.4(L) 9.3(L) 8.4(L)  Hematocrit 38.4 - 49.9 % 25.1(L) 27.8(L) 25.8(L)  Platelets 140 - 400 10e3/uL 132(L) 192 120(L)    CMP Latest Ref Rng 05/13/2016 04/29/2016 04/16/2016  Glucose 70 - 140 mg/dl 173(H) 201(H) 174(H)  BUN 7.0 - 26.0 mg/dL 5.9(L) 10.3 16.0  Creatinine 0.7 - 1.3 mg/dL 0.8 0.8 0.9  Sodium 136 - 145 mEq/L 140 141 139  Potassium 3.5 - 5.1 mEq/L 4.3 4.3 4.0  CO2 22 - 29 mEq/L _0 Calcium 8.4 - 10.4 mg/dL 8.5 8.5 8.7  Total Protein 6.4 - 8.3 g/dL 7.0 7.0 7.0  Total Bilirubin 0.20 - 1.20 mg/dL 0.50 0.50 0.63  Alkaline Phos 40 - 150 U/L 178(H) 161(H) 160(H)  AST 5 - 34 U/L _1 ALT 0 - 55 U/L _2 INITIAL tumor markers AFP 3.2 CEA > 10,000 CA 19-9 12,929.6  CEA  Status: Finalresult Visible to patient:  Not Released Nextappt: 05/20/2016 at 11:00 AM in Oncology [facility] 2) Dx:  Cancer of sigmoid colon ([place])                Ref Range 11d ago  3moago  267mogo     CEA 0.0 - 4.7 ng/mL 566.1 (H) 289.7 (H)CM 221.4 (H)CM   Comments:           Roche ECLIA methodology    Nonsmokers <3.9           Pathology report  Liver, needle/core biopsy - METASTATIC ADENOCARCINOMA, SEE COMMENT. Microscopic Comment The adenocarcinoma demonstrates the following immunophenotype: Cytokeratin 7 - negative expression. Cytokeratin 20 - strong diffuse expression. CD2 - strong diffuse expression. Overall the morphology and immunophenotype are that of metastatic adenocarcinoma primary to colorectum. The recent nuclear medicine scan demonstrating sigmoid mass with associated liver masses is noted.  [employer] test result:    RADIOGRAPHIC STUDIES: I have personally reviewed the outside CT scan image with patient and his son.   CT chest, abdomen and pelvis with IV contrast oN  02/29/2016 IMPRESSION: 1. Liver metastasis are improved from previous exam. No new or progressive liver lesions identified 2. Similar appearance of right adrenal gland metastasis. 3. There are 2 small nodules within the left lung, which are new from previous exam and may represent early pulmonary metastasis. 4. Aortic atherosclerosis and multi vessel coronary artery calcification.   ASSESSMENT & PLAN:  [age]ear old Jason Reilly, with past history of hypertension and dilated nonischemic gammopathy with EF 25%, no clinical signs of heart failure, who was found to have hepatitis B infection lately, and multiple liver lesions on the CT scan. He has extremely high CEA and CA 19-9 levels. PET scan reviewed a hypermetabolic sigmoid colon mass, diffuse liver metastasis, probable lung and adrenal gland metastasis.  1. Metastatic sigmoid colon cancer, with diffuse liver, lungs, node and left adrenal gland metastases. KRAS/NRAS wild type, MSI-stable -Liver biopsy showed metastatic adenocarcinoma. His tumor were strongly positive for  CK20 and CD2, consistent with primary colorectal primary. KRAS and NRAS mutations were not detected.  -Pt  understands that this is incurable cancer, and he has very high disease burden and overall prognosis is poor. The treatment goal is palliative -He had excellent response to first-line FOLFOX, but treatment was complicated with GI bleeding and neutropenic fever, resolved now. -he is currently on second line chemotherapy with irritecan and panitumumab, tolerating well  - I reviewed his restaging CT scan from 02/29/2016, which showed a continuous responding in liver metastasis, no other new lesions. -here to severe diarrhea from Irinotecan, dose has been reduced from 213m (1626mm2) to 20037m125m43m, rounded), which has been increased to 240 mg (140mg64m from next cycle 13 and he tolerated well, we'll continue at the current dose. -His tumor marker CEA has been trending up, concerning for disease progression, we'll continue monitoring. -He is clinical doing well, lab reviewed, adequate for treatment, we'll continue chemotherapy today.  -restaging scan before next f/u   2. Grade 1-2 skin rashes, stable  -Secondary to panitumumab, slightly worse today -continue hydrocortisone 2.5%, and clindamycin gel 1%  twice daily as needed  -He knows to avoid sun exposure, and call me if it gets worse.  3. Gastric ulcer with significant GI bleeding in April and Aug 2016 -He is on PPI, continue once daily  -Repeat his EGD on 08/15/2015 showed near complete healing of his gastric ulcer -continue Nadolol 20mg 33m per Dr. Stark Fuller Planncourage him to follow-up with Dr. Stark Fuller Planype 2 DM  -HbA1c was 8.5, blood glucose not well controlled, he run out of metformin  -I encourage him to follow-up with his primary care physician Dr. WilliaJimmye Normancall his office for metformin refill today   5. HTN, Dilated nonischemic ischemia cardiomyopathy with EF 25% -He is clinically doing well without symptoms of CHF.  However this is probably going to impact his chemotherapy.Will try to avoid cardiotoxic chemotherapy agent and avoid fluid overload during chemotherapy. -Continue follow-up with cardiology. -will hold on his lisinopril-HCTZ for now, his BP has been normal lately   6 Hepatitis B carrier, with mild portal hypertension  -Per liver clinic, no need for treatment. Follow-up with Dr. StarksSilvio PateMalnutrition -I encouraged him to eat more, and take supplements as needed. -improved, gained weight back after I reduce his chemotherapy dose  -follow up with Dietitian   8. Anemia secondary to GI bleeding, iron deficiency and chemo  -Repeat lab on 06/06/2015 showed ferritin 117, serum iron 24, saturation 8%, which supports iron deficiency -He received IV Feraheme again in Aug 2016 after GI bleeding  -Repeat a ferritin was 273 on 10/03/2015, much improved  -he received iv feraheme again on 11/27/2015 and 12/04/2015, but anemia did not improve much. -His anemia has been getting worse lately, probably related to chemotherapy, we'll close monitor any signs of GI bleeding. -Blood transfusion if hemoglobin less than 8 or symptomatic anemia with hemoglobin 8-9 -Hemoglobin 8.4 today, not symptomatic, will continue monitoring  11. hypomagnesemia  -secondary to panitumumab -he receives IV mag 6 g every week -He is taking magnesium pill 2 tablets 3 times a day -follow up weekly    Plan -Lab results reviewed, adequate for treatment, we'll proceed chemotherapy and panitumumab today and continue every 2 weeks.  -magnesium infusion weekly -I'll see him back in 4 weeks, restaging CT one week before   All questions were answered. The patient knows to call the clinic with any problems, questions or concerns.  I spent 25 minutes counseling the patient face to face. The total time spent in  the appointment was 30 minutes and more than 50% was on counseling.     Jason Merle, MD 05/13/2016  is

## 2016-05-13 NOTE — Patient Instructions (Signed)
Please have Mr. Nowakowski arrive at the [facility] tomorrow (05/14/2016) between 7:00 am and 7:15 am to check-in for a 7:30 am appointment. His treatment will last approximately 4-5 hours.   [facility] Discharge Instructions for Patients Receiving Chemotherapy  Today you received the following chemotherapy agents:  Vectibix and Irinotecan.  To help prevent nausea and vomiting after your treatment, we encourage you to take your nausea medication as directed.   If you develop nausea and vomiting that is not controlled by your nausea medication, call the clinic.   BELOW ARE SYMPTOMS THAT SHOULD BE REPORTED IMMEDIATELY:  *FEVER GREATER THAN 100.5 F  *CHILLS WITH OR WITHOUT FEVER  NAUSEA AND VOMITING THAT IS NOT CONTROLLED WITH YOUR NAUSEA MEDICATION  *UNUSUAL SHORTNESS OF BREATH  *UNUSUAL BRUISING OR BLEEDING  TENDERNESS IN MOUTH AND THROAT WITH OR WITHOUT PRESENCE OF ULCERS  *URINARY PROBLEMS  *BOWEL PROBLEMS  UNUSUAL RASH Items with * indicate a potential emergency and should be followed up as soon as possible.  Feel free to call the clinic you have any questions or concerns. The clinic phone number is [phone].  Please show the [place] at check-in to the Emergency Department and triage nurse.

## 2016-05-13 NOTE — Telephone Encounter (Signed)
per pof to sch pt appt-gave pt copy of avs-verifies w/Dr Charlaine Dalton adv to send drFeng email to check

## 2016-05-14 ENCOUNTER — Ambulatory Visit (HOSPITAL_BASED_OUTPATIENT_CLINIC_OR_DEPARTMENT_OTHER): Payer: PPO

## 2016-05-14 VITALS — BP 123/63 | HR 84 | Temp 98.1°F | Resp 18

## 2016-05-14 DIAGNOSIS — Z95828 Presence of other vascular implants and grafts: Secondary | ICD-10-CM

## 2016-05-14 DIAGNOSIS — D509 Iron deficiency anemia, unspecified: Secondary | ICD-10-CM

## 2016-05-14 MED ORDER — SODIUM CHLORIDE 0.9 % IV SOLN
510.0000 mg | Freq: Once | INTRAVENOUS | Status: DC
  Filled 2016-05-14: qty 17

## 2016-05-14 MED ORDER — SODIUM CHLORIDE 0.9 % IV SOLN
Freq: Once | INTRAVENOUS | Status: AC
  Administered 2016-05-14: 08:00:00 via INTRAVENOUS
  Filled 2016-05-14: qty 250

## 2016-05-14 MED ORDER — SODIUM CHLORIDE 0.9 % IJ SOLN
10.0000 mL | INTRAMUSCULAR | Status: DC | PRN
  Administered 2016-05-14: 10 mL via INTRAVENOUS
  Filled 2016-05-14: qty 10

## 2016-05-14 MED ORDER — HEPARIN SOD (PORK) LOCK FLUSH 100 UNIT/ML IV SOLN
500.0000 [IU] | Freq: Once | INTRAVENOUS | Status: AC | PRN
Start: 2016-05-14 — End: 2016-05-14
  Administered 2016-05-14: 500 [IU] via INTRAVENOUS
  Filled 2016-05-14: qty 5

## 2016-05-14 NOTE — Patient Instructions (Addendum)
Magnesium Sulfate injection What is this medicine? MAGNESIUM SULFATE (mag NEE zee um SUL fate) is an electrolyte injection commonly used to treat low magnesium levels in your blood and to prevent or control certain seizures. This medicine may be used for other purposes; ask your health care provider or pharmacist if you have questions. What should I tell my health care provider before I take this medicine? They need to know if you have any of these conditions: -heart disease -history of irregular heart beat -kidney disease -an unusual or allergic reaction to magnesium sulfate, medicines, foods, dyes, or preservatives -pregnant or trying to get pregnant -breast-feeding How should I use this medicine? This medicine is for infusion into a vein. It is given by a health care professional in a hospital or clinic setting. Talk to your pediatrician regarding the use of this medicine in children. While this drug may be prescribed for selected conditions, precautions do apply. Overdosage: If you think you have taken too much of this medicine contact a poison control center or emergency room at once. NOTE: This medicine is only for you. Do not share this medicine with others. What if I miss a dose? This does not apply. What may interact with this medicine? This medicine may interact with the following medications: -certain medicines for anxiety or sleep -certain medicines for seizures like phenobarbital -digoxin -medicines that relax muscles for surgery -narcotic medicines for pain This list may not describe all possible interactions. Give your health care provider a list of all the medicines, herbs, non-prescription drugs, or dietary supplements you use. Also tell them if you smoke, drink alcohol, or use illegal drugs. Some items may interact with your medicine. What should I watch for while using this medicine? Your condition will be monitored carefully while you are receiving this medicine. You  may need blood work done while you are receiving this medicine. What side effects may I notice from receiving this medicine? Side effects that you should report to your doctor or health care professional as soon as possible: -allergic reactions like skin rash, itching or hives, swelling of the face, lips, or tongue -facial flushing -muscle weakness -signs and symptoms of low blood pressure like dizziness; feeling faint or lightheaded, falls; unusually weak or tired -signs and symptoms of a dangerous change in heartbeat or heart rhythm like chest pain; dizziness; fast or irregular heartbeat; palpitations; breathing problems -sweating This list may not describe all possible side effects. Call your doctor for medical advice about side effects. You may report side effects to FDA at 1-800-FDA-1088. Where should I keep my medicine? This drug is given in a hospital or clinic and will not be stored at home. NOTE: This sheet is a summary. It may not cover all possible information. If you have questions about this medicine, talk to your doctor, pharmacist, or health care provider.    2016, Elsevier/Gold Standard. (2013-02-22 10:35:11)

## 2016-05-20 ENCOUNTER — Ambulatory Visit (HOSPITAL_BASED_OUTPATIENT_CLINIC_OR_DEPARTMENT_OTHER): Payer: PPO

## 2016-05-20 ENCOUNTER — Other Ambulatory Visit (HOSPITAL_BASED_OUTPATIENT_CLINIC_OR_DEPARTMENT_OTHER): Payer: PPO

## 2016-05-20 VITALS — BP 109/63 | HR 92 | Temp 98.6°F | Resp 16

## 2016-05-20 DIAGNOSIS — C187 Malignant neoplasm of sigmoid colon: Secondary | ICD-10-CM | POA: Diagnosis not present

## 2016-05-20 DIAGNOSIS — D500 Iron deficiency anemia secondary to blood loss (chronic): Secondary | ICD-10-CM

## 2016-05-20 DIAGNOSIS — D509 Iron deficiency anemia, unspecified: Secondary | ICD-10-CM

## 2016-05-20 DIAGNOSIS — Z95828 Presence of other vascular implants and grafts: Secondary | ICD-10-CM

## 2016-05-20 LAB — CBC WITH DIFFERENTIAL/PLATELET
BASO%: 0.3 % (ref 0.0–2.0)
BASOS ABS: 0 10*3/uL (ref 0.0–0.1)
EOS%: 2.7 % (ref 0.0–7.0)
Eosinophils Absolute: 0.1 10*3/uL (ref 0.0–0.5)
HEMATOCRIT: 25.2 % — AB (ref 38.4–49.9)
HGB: 8.4 g/dL — ABNORMAL LOW (ref 13.0–17.1)
LYMPH#: 0.7 10*3/uL — AB (ref 0.9–3.3)
LYMPH%: 19.2 % (ref 14.0–49.0)
MCH: 28.2 pg (ref 27.2–33.4)
MCHC: 33.3 g/dL (ref 32.0–36.0)
MCV: 84.6 fL (ref 79.3–98.0)
MONO#: 0.2 10*3/uL (ref 0.1–0.9)
MONO%: 5.9 % (ref 0.0–14.0)
NEUT#: 2.4 10*3/uL (ref 1.5–6.5)
NEUT%: 71.9 % (ref 39.0–75.0)
Platelets: 130 10*3/uL — ABNORMAL LOW (ref 140–400)
RBC: 2.98 10*6/uL — ABNORMAL LOW (ref 4.20–5.82)
RDW: 15.8 % — ABNORMAL HIGH (ref 11.0–14.6)
WBC: 3.4 10*3/uL — ABNORMAL LOW (ref 4.0–10.3)

## 2016-05-20 LAB — MAGNESIUM: MAGNESIUM: 0.9 mg/dL — AB (ref 1.5–2.5)

## 2016-05-20 MED ORDER — HEPARIN SOD (PORK) LOCK FLUSH 100 UNIT/ML IV SOLN
500.0000 [IU] | Freq: Once | INTRAVENOUS | Status: AC | PRN
  Administered 2016-05-20: 500 [IU] via INTRAVENOUS
  Filled 2016-05-20: qty 5

## 2016-05-20 MED ORDER — SODIUM CHLORIDE 0.9 % IJ SOLN
10.0000 mL | INTRAMUSCULAR | Status: DC | PRN
  Administered 2016-05-20: 10 mL via INTRAVENOUS
  Filled 2016-05-20: qty 10

## 2016-05-20 MED ORDER — SODIUM CHLORIDE 0.9 % IV SOLN
Freq: Once | INTRAVENOUS | Status: AC
  Administered 2016-05-20: 12:00:00 via INTRAVENOUS
  Filled 2016-05-20: qty 250

## 2016-05-20 MED ORDER — SODIUM CHLORIDE 0.9 % IV SOLN
510.0000 mg | Freq: Once | INTRAVENOUS | Status: AC
  Administered 2016-05-20: 510 mg via INTRAVENOUS
  Filled 2016-05-20: qty 17

## 2016-05-20 NOTE — Patient Instructions (Signed)
Ferumoxytol injection What is this medicine? FERUMOXYTOL is an iron complex. Iron is used to make healthy red blood cells, which carry oxygen and nutrients throughout the body. This medicine is used to treat iron deficiency anemia in people with chronic kidney disease. This medicine may be used for other purposes; ask your health care provider or pharmacist if you have questions. What should I tell my health care provider before I take this medicine? They need to know if you have any of these conditions: -anemia not caused by low iron levels -high levels of iron in the blood -magnetic resonance imaging (MRI) test scheduled -an unusual or allergic reaction to iron, other medicines, foods, dyes, or preservatives -pregnant or trying to get pregnant -breast-feeding How should I use this medicine? This medicine is for injection into a vein. It is given by a health care professional in a hospital or clinic setting. Talk to your pediatrician regarding the use of this medicine in children. Special care may be needed. Overdosage: If you think you have taken too much of this medicine contact a poison control center or emergency room at once. NOTE: This medicine is only for you. Do not share this medicine with others. What if I miss a dose? It is important not to miss your dose. Call your doctor or health care professional if you are unable to keep an appointment. What may interact with this medicine? This medicine may interact with the following medications: -other iron products This list may not describe all possible interactions. Give your health care provider a list of all the medicines, herbs, non-prescription drugs, or dietary supplements you use. Also tell them if you smoke, drink alcohol, or use illegal drugs. Some items may interact with your medicine. What should I watch for while using this medicine? Visit your doctor or healthcare professional regularly. Tell your doctor or healthcare  professional if your symptoms do not start to get better or if they get worse. You may need blood work done while you are taking this medicine. You may need to follow a special diet. Talk to your doctor. Foods that contain iron include: whole grains/cereals, dried fruits, beans, or peas, leafy green vegetables, and organ meats (liver, kidney). What side effects may I notice from receiving this medicine? Side effects that you should report to your doctor or health care professional as soon as possible: -allergic reactions like skin rash, itching or hives, swelling of the face, lips, or tongue -breathing problems -changes in blood pressure -feeling faint or lightheaded, falls -fever or chills -flushing, sweating, or hot feelings -swelling of the ankles or feet Side effects that usually do not require medical attention (Report these to your doctor or health care professional if they continue or are bothersome.): -diarrhea -headache -nausea, vomiting -stomach pain This list may not describe all possible side effects. Call your doctor for medical advice about side effects. You may report side effects to FDA at 1-800-FDA-1088. Where should I keep my medicine? This drug is given in a hospital or clinic and will not be stored at home. NOTE: This sheet is a summary. It may not cover all possible information. If you have questions about this medicine, talk to your doctor, pharmacist, or health care provider.    2016, Elsevier/Gold Standard. (2012-05-29 15:23:36)  Magnesium Sulfate injection What is this medicine? MAGNESIUM SULFATE (mag NEE zee um SUL fate) is an electrolyte injection commonly used to treat low magnesium levels in your blood and to prevent or control certain seizures.  This medicine may be used for other purposes; ask your health care provider or pharmacist if you have questions. What should I tell my health care provider before I take this medicine? They need to know if you have  any of these conditions: -heart disease -history of irregular heart beat -kidney disease -an unusual or allergic reaction to magnesium sulfate, medicines, foods, dyes, or preservatives -pregnant or trying to get pregnant -breast-feeding How should I use this medicine? This medicine is for infusion into a vein. It is given by a health care professional in a hospital or clinic setting. Talk to your pediatrician regarding the use of this medicine in children. While this drug may be prescribed for selected conditions, precautions do apply. Overdosage: If you think you have taken too much of this medicine contact a poison control center or emergency room at once. NOTE: This medicine is only for you. Do not share this medicine with others. What if I miss a dose? This does not apply. What may interact with this medicine? This medicine may interact with the following medications: -certain medicines for anxiety or sleep -certain medicines for seizures like phenobarbital -digoxin -medicines that relax muscles for surgery -narcotic medicines for pain This list may not describe all possible interactions. Give your health care provider a list of all the medicines, herbs, non-prescription drugs, or dietary supplements you use. Also tell them if you smoke, drink alcohol, or use illegal drugs. Some items may interact with your medicine. What should I watch for while using this medicine? Your condition will be monitored carefully while you are receiving this medicine. You may need blood work done while you are receiving this medicine. What side effects may I notice from receiving this medicine? Side effects that you should report to your doctor or health care professional as soon as possible: -allergic reactions like skin rash, itching or hives, swelling of the face, lips, or tongue -facial flushing -muscle weakness -signs and symptoms of low blood pressure like dizziness; feeling faint or lightheaded,  falls; unusually weak or tired -signs and symptoms of a dangerous change in heartbeat or heart rhythm like chest pain; dizziness; fast or irregular heartbeat; palpitations; breathing problems -sweating This list may not describe all possible side effects. Call your doctor for medical advice about side effects. You may report side effects to FDA at 1-800-FDA-1088. Where should I keep my medicine? This drug is given in a hospital or clinic and will not be stored at home. NOTE: This sheet is a summary. It may not cover all possible information. If you have questions about this medicine, talk to your doctor, pharmacist, or health care provider.    2016, Elsevier/Gold Standard. (2013-02-22 10:35:11)

## 2016-05-27 ENCOUNTER — Ambulatory Visit: Payer: PPO

## 2016-05-27 ENCOUNTER — Other Ambulatory Visit (HOSPITAL_BASED_OUTPATIENT_CLINIC_OR_DEPARTMENT_OTHER): Payer: PPO

## 2016-05-27 ENCOUNTER — Ambulatory Visit (HOSPITAL_BASED_OUTPATIENT_CLINIC_OR_DEPARTMENT_OTHER): Payer: PPO | Admitting: Nurse Practitioner

## 2016-05-27 ENCOUNTER — Ambulatory Visit (HOSPITAL_BASED_OUTPATIENT_CLINIC_OR_DEPARTMENT_OTHER): Payer: PPO

## 2016-05-27 VITALS — BP 115/62 | HR 72 | Temp 97.6°F | Resp 18 | Ht 65.0 in | Wt 141.9 lb

## 2016-05-27 DIAGNOSIS — C187 Malignant neoplasm of sigmoid colon: Secondary | ICD-10-CM

## 2016-05-27 DIAGNOSIS — C786 Secondary malignant neoplasm of retroperitoneum and peritoneum: Secondary | ICD-10-CM | POA: Diagnosis not present

## 2016-05-27 DIAGNOSIS — Z95828 Presence of other vascular implants and grafts: Secondary | ICD-10-CM

## 2016-05-27 DIAGNOSIS — C7972 Secondary malignant neoplasm of left adrenal gland: Secondary | ICD-10-CM | POA: Diagnosis not present

## 2016-05-27 DIAGNOSIS — E119 Type 2 diabetes mellitus without complications: Secondary | ICD-10-CM

## 2016-05-27 DIAGNOSIS — I10 Essential (primary) hypertension: Secondary | ICD-10-CM

## 2016-05-27 DIAGNOSIS — C7800 Secondary malignant neoplasm of unspecified lung: Secondary | ICD-10-CM

## 2016-05-27 DIAGNOSIS — Z5112 Encounter for antineoplastic immunotherapy: Secondary | ICD-10-CM | POA: Diagnosis not present

## 2016-05-27 DIAGNOSIS — C189 Malignant neoplasm of colon, unspecified: Secondary | ICD-10-CM

## 2016-05-27 DIAGNOSIS — C787 Secondary malignant neoplasm of liver and intrahepatic bile duct: Secondary | ICD-10-CM | POA: Diagnosis not present

## 2016-05-27 DIAGNOSIS — D6481 Anemia due to antineoplastic chemotherapy: Secondary | ICD-10-CM

## 2016-05-27 DIAGNOSIS — Z5111 Encounter for antineoplastic chemotherapy: Secondary | ICD-10-CM

## 2016-05-27 DIAGNOSIS — D509 Iron deficiency anemia, unspecified: Secondary | ICD-10-CM

## 2016-05-27 DIAGNOSIS — R21 Rash and other nonspecific skin eruption: Secondary | ICD-10-CM

## 2016-05-27 LAB — CBC WITH DIFFERENTIAL/PLATELET
BASO%: 0.3 % (ref 0.0–2.0)
BASOS ABS: 0 10*3/uL (ref 0.0–0.1)
EOS%: 2.6 % (ref 0.0–7.0)
Eosinophils Absolute: 0.1 10*3/uL (ref 0.0–0.5)
HEMATOCRIT: 28.3 % — AB (ref 38.4–49.9)
HGB: 9.3 g/dL — ABNORMAL LOW (ref 13.0–17.1)
LYMPH%: 15.3 % (ref 14.0–49.0)
MCH: 28.4 pg (ref 27.2–33.4)
MCHC: 32.7 g/dL (ref 32.0–36.0)
MCV: 86.9 fL (ref 79.3–98.0)
MONO#: 0.4 10*3/uL (ref 0.1–0.9)
MONO%: 10.8 % (ref 0.0–14.0)
NEUT#: 2.8 10*3/uL (ref 1.5–6.5)
NEUT%: 71 % (ref 39.0–75.0)
Platelets: 168 10*3/uL (ref 140–400)
RBC: 3.25 10*6/uL — AB (ref 4.20–5.82)
RDW: 19.2 % — ABNORMAL HIGH (ref 11.0–14.6)
WBC: 3.9 10*3/uL — ABNORMAL LOW (ref 4.0–10.3)
lymph#: 0.6 10*3/uL — ABNORMAL LOW (ref 0.9–3.3)

## 2016-05-27 LAB — COMPREHENSIVE METABOLIC PANEL
ALBUMIN: 3.5 g/dL (ref 3.5–5.0)
ALK PHOS: 194 U/L — AB (ref 40–150)
ALT: 26 U/L (ref 0–55)
AST: 32 U/L (ref 5–34)
Anion Gap: 10 mEq/L (ref 3–11)
BUN: 8.1 mg/dL (ref 7.0–26.0)
CALCIUM: 8.9 mg/dL (ref 8.4–10.4)
CHLORIDE: 108 meq/L (ref 98–109)
CO2: 24 mEq/L (ref 22–29)
Creatinine: 0.8 mg/dL (ref 0.7–1.3)
Glucose: 79 mg/dl (ref 70–140)
POTASSIUM: 4.2 meq/L (ref 3.5–5.1)
SODIUM: 142 meq/L (ref 136–145)
Total Bilirubin: 0.46 mg/dL (ref 0.20–1.20)
Total Protein: 7.4 g/dL (ref 6.4–8.3)

## 2016-05-27 LAB — MAGNESIUM: Magnesium: 1 mg/dl — CL (ref 1.5–2.5)

## 2016-05-27 MED ORDER — SODIUM CHLORIDE 0.9 % IV SOLN
Freq: Once | INTRAVENOUS | Status: AC
  Administered 2016-05-27: 12:00:00 via INTRAVENOUS
  Filled 2016-05-27: qty 4

## 2016-05-27 MED ORDER — SODIUM CHLORIDE 0.9 % IV SOLN: Freq: Once | INTRAVENOUS | Status: DC

## 2016-05-27 MED ORDER — SODIUM CHLORIDE 0.9 % IJ SOLN
10.0000 mL | INTRAMUSCULAR | Status: DC | PRN
  Administered 2016-05-27: 10 mL
  Filled 2016-05-27: qty 10

## 2016-05-27 MED ORDER — IRINOTECAN HCL CHEMO INJECTION 100 MG/5ML
140.0000 mg/m2 | Freq: Once | INTRAVENOUS | Status: AC
  Administered 2016-05-27: 240 mg via INTRAVENOUS
  Filled 2016-05-27: qty 5

## 2016-05-27 MED ORDER — ATROPINE SULFATE 1 MG/ML IJ SOLN
INTRAMUSCULAR | Status: AC
  Filled 2016-05-27: qty 1

## 2016-05-27 MED ORDER — PANITUMUMAB CHEMO INJECTION 100 MG/5ML
6.0000 mg/kg | Freq: Once | INTRAVENOUS | Status: AC
  Administered 2016-05-27: 380 mg via INTRAVENOUS
  Filled 2016-05-27: qty 19

## 2016-05-27 MED ORDER — ATROPINE SULFATE 1 MG/ML IJ SOLN
0.5000 mg | Freq: Once | INTRAMUSCULAR | Status: AC | PRN
  Administered 2016-05-27: 0.5 mg via INTRAVENOUS

## 2016-05-27 MED ORDER — HEPARIN SOD (PORK) LOCK FLUSH 100 UNIT/ML IV SOLN
500.0000 [IU] | Freq: Once | INTRAVENOUS | Status: AC | PRN
  Administered 2016-05-27: 500 [IU]
  Filled 2016-05-27: qty 5

## 2016-05-27 MED ORDER — SODIUM CHLORIDE 0.9 % IJ SOLN
10.0000 mL | INTRAMUSCULAR | Status: DC | PRN
  Administered 2016-05-27: 10 mL via INTRAVENOUS
  Filled 2016-05-27: qty 10

## 2016-05-27 NOTE — Patient Instructions (Signed)
[facility] Discharge Instructions for Patients Receiving Chemotherapy  Today you received the following chemotherapy agents Vectibix and Irinotecan.  To help prevent nausea and vomiting after your treatment, we encourage you to take your nausea medication as prescribed.   If you develop nausea and vomiting that is not controlled by your nausea medication, call the clinic.   BELOW ARE SYMPTOMS THAT SHOULD BE REPORTED IMMEDIATELY:  *FEVER GREATER THAN 100.5 F  *CHILLS WITH OR WITHOUT FEVER  NAUSEA AND VOMITING THAT IS NOT CONTROLLED WITH YOUR NAUSEA MEDICATION  *UNUSUAL SHORTNESS OF BREATH  *UNUSUAL BRUISING OR BLEEDING  TENDERNESS IN MOUTH AND THROAT WITH OR WITHOUT PRESENCE OF ULCERS  *URINARY PROBLEMS  *BOWEL PROBLEMS  UNUSUAL RASH Items with * indicate a potential emergency and should be followed up as soon as possible.  Feel free to call the clinic you have any questions or concerns. The clinic phone number is [phone].  Please show the CHEMO ALERT CARD at check-in to the Emergency Department and triage nurse.   

## 2016-05-27 NOTE — Progress Notes (Signed)
[facility] OFFICE PROGRESS NOTE   Diagnosis:   Oncology History   Metastatic colon cancer to liver   Staging form: Colon and Rectum, AJCC 7th Edition     Clinical: Stage Unknown (TX, NX, M1) - Unsigned     Cancer of sigmoid colon ([place])   10/28/2014 Tumor Marker AFP 3.2 CEA > 10,000 CA 19-9 12,929.6. tumor (-) KRAS and NRAS mutation.    11/10/2014 Imaging PET scan showed hypermetabolic mass in the sigmoid colon was noted metastasis in the retroperitoneum. Probable left adrenal and pulmonary metastasis, and diffuse liver metastasis.   11/21/2014 Initial Diagnosis Metastatic colon cancer to liver, lung, abd nodes and left adrenal gland. Diagnosis was made by liver biopsy.    11/30/2014 -  Chemotherapy First line chemo [id number], Panitumumab added from second cycle    12/28/2014 - 12/31/2014 Hospital Admission Was admitted for dehydration, neutropenia fever with UTI, and severe skin rash.   02/08/2015 - 02/12/2015 Hospital Admission He was admitted for upper GI bleeding, e.g. showed a gastric ulcer with clots, status post appendectomy injection. He also received a blood transfusion.   03/01/2015 Tumor Marker CEA 694, CA19.9 462   03/13/2015 Imaging CT CAP showed partial response, no new lesions.    03/15/2015 - 05/23/2015 Chemotherapy restart FOLFOX, held again on 8/9 due to GI bleeding    05/31/2015 - 06/02/2015 Hospital Admission He was admitted to [facility] in [place] due to upper GI bleeding, EGD showed gastric and dudenal ulcers    06/27/2015 - 06/29/2015 Hospital Admission he was admitted for dairrhea and pancolitis, c-diff and stool cultures were negative, treated with antibiotics   07/12/2015 -  Chemotherapy panitumumab 41m/kg, every 2 weeks   11/08/2015 - 11/10/2015 Hospital Admission Pt was admitted for sepsis and hypotension, was found to have (+) influenza A and treated.    11/30/2015 Imaging  CT scans reviewed continued improvement in the liver metastasis, no other new  lesions.    INTERVAL HISTORY:   Mr. ABoylenreturns as scheduled. He completed another cycle of irinotecan/PANITUMUMAB 05/13/2016. He denies nausea/vomiting. No mouth sores. One episode of loose stools. No pain. He denies bleeding. No shortness of breath. No chest pain. No leg swelling. He reports a good appetite.  Objective:  Vital signs in last 24 hours:  Blood pressure 115/62, pulse 72, temperature 97.6 F (36.4 C), temperature source Oral, resp. rate 18, height 5' 5" (1.651 m), weight 141 lb 14.4 oz (64.4 kg), SpO2 97 %.    HEENT: No thrush or ulcers. Resp: Inspiratory rales both lung bases. No respiratory distress. Cardio: Regular rate and rhythm. GI: Abdomen soft and nontender. No hepatomegaly. Vascular: No leg edema. Port-A-Cath without erythema.  Lab Results:  Lab Results  Component Value Date   WBC 3.9 (L) 05/27/2016   HGB 9.3 (L) 05/27/2016   HCT 28.3 (L) 05/27/2016   MCV 86.9 05/27/2016   PLT 168 05/27/2016   NEUTROABS 2.8 05/27/2016    Imaging:  No results found.  Medications: I have reviewed the patient's current medications.  Assessment/Plan: 1. Metastatic sigmoid colon cancer, with diffuse liver, lungs, node and left adrenal gland metastases. KRAS/NRAS wild type, MSI-stable. Initially treated with FOLFOX. Currently on active treatment with irinotecan/PANITUMUMAB. Restaging CT evaluation 02/29/2016 showed improvement in liver metastasis; similar appearance right adrenal gland metastasis; 2 small nodules in the left lung, new. 2. Grade 1-2 skin rash secondary to PANITUMUMAB. 3. Gastric ulcer with significant GI bleeding April and August 2016 repeat EGD 08/15/2015 showed near complete  healing of the gastric ulcer. He continues PPI. 4. Type 2 diabetes. 5. 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 6. 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 Malnutrition 8. Anemia secondary to GI bleeding, iron deficiency and  chemotherapy. 9. Hypomagnesemia secondary to PANITUMUMAB. He receives IV magnesium 6 g weekly. Also takes oral magnesium.   Disposition: Mr. Devera appears stable. Plan to proceed with irinotecan/PANITUMUMAB today as scheduled. He is scheduled for restaging CT scans in one week. He will return for a follow-up visit with [name] in 2 weeks. He will contact the office in the interim with any problems.    Ned Card ANP/GNP-BC   05/27/2016  10:47 AM

## 2016-05-28 ENCOUNTER — Other Ambulatory Visit: Payer: PPO

## 2016-05-28 ENCOUNTER — Ambulatory Visit (HOSPITAL_BASED_OUTPATIENT_CLINIC_OR_DEPARTMENT_OTHER): Payer: PPO

## 2016-05-28 VITALS — BP 143/50 | HR 65 | Temp 97.6°F | Resp 16

## 2016-05-28 DIAGNOSIS — D509 Iron deficiency anemia, unspecified: Secondary | ICD-10-CM

## 2016-05-28 DIAGNOSIS — Z95828 Presence of other vascular implants and grafts: Secondary | ICD-10-CM

## 2016-05-28 MED ORDER — SODIUM CHLORIDE 0.9 % IV SOLN
Freq: Once | INTRAVENOUS | Status: AC
  Administered 2016-05-28: 10:00:00 via INTRAVENOUS
  Filled 2016-05-28: qty 250

## 2016-05-28 MED ORDER — HEPARIN SOD (PORK) LOCK FLUSH 100 UNIT/ML IV SOLN
500.0000 [IU] | Freq: Once | INTRAVENOUS | Status: AC | PRN
  Administered 2016-05-28: 500 [IU] via INTRAVENOUS
  Filled 2016-05-28: qty 5

## 2016-05-28 MED ORDER — SODIUM CHLORIDE 0.9 % IJ SOLN
10.0000 mL | INTRAMUSCULAR | Status: DC | PRN
  Administered 2016-05-28: 10 mL via INTRAVENOUS
  Filled 2016-05-28: qty 10

## 2016-05-28 NOTE — Patient Instructions (Signed)
Magnesium Sulfate injection ?y l thu?c g? MAGNESIUM SULFATE l ch?t ?i?n gi?i d?ng tim th??ng ???c dng ?? ?i?u tr? m?c magnesium th?p ? trong mu v ?? phng ng?a ho?c ki?m sot m?t s? lo?i co gi?t. Thu?c ny c th? ???c dng cho nh?ng m?c ?ch khc; hy h?i ng??i cung c?p d?ch v? y t? ho?c d??c s? c?a mnh, n?u qu v? c th?c m?c. Ti c?n ph?i bo cho ng??i cung c?p d?ch v? y t? c?a mnh ?i?u g tr??c khi dng thu?c ny? H? c?n bi?t li?u qu v? c b?t k? tnh tr?ng no sau ?y khng: -b?nh tim -ti?n s? tim ??p khng ??u -b?nh th?n -pha?n ??ng b?t th???ng ho??c di? ??ng v??i magnesium sulfate ho?c cc lo?i thu?c -pha?n ??ng b?t th???ng ho??c di? ??ng v??i th??c ph?m, thu?c nhu?m, ho??c ch?t ba?o qua?n -?ang c thai ho??c ??nh co? thai -?ang cho con bu? Ti nn s? d?ng thu?c ny nh? th? no? Thu?c ny ?? truy?n vo t?nh m?ch. Thu?c ny ???c s? d?ng b?i chuyn vin y t? ? b?nh vi?n ho?c ? phng m?ch. Hy bn v?i bc s? nhi khoa c?a qu v? v? vi?c dng thu?c ny ? tr? em. Thu?c ny c th? ???c k toa trong nh?ng tr??ng h?p ch?n l?c, nh?ng c?n ph?i th?n tr?ng. Qu li?u: N?u qu v? cho r?ng mnh ? dng qu nhi?u thu?c ny, th hy lin l?c v?i trung tm ki?m sot ch?t ??c ho?c phng c?p c?u ngay l?p t?c. L?U : Thu?c ny ch? dnh ring cho qu v?. Khng chia s? thu?c ny v?i nh?ng ng??i khc. N?u ti l? qun m?t li?u th sao? ?i?u ny khng p d?ng. Nh?ng g c th? t??ng tc v?i thu?c ny? Thu?c ny c th? t??ng tc v?i cc thu?c sau ?y: -m?t s? thu?c dng cho ch?ng lo u ho?c cc v?n ?? v? gi?c ng? -m?t s? thu?c dng cho cc ch?ng co gi?t, ch?ng h?n nh? phenobarbital -digoxin -cc thu?c lm gin c? ?? gi?i ph?u -cc thu?c gi?m ?au gy ng? (narcotic) Danh sch ny c th? khng m t? ?? h?t cc t??ng tc c th? x?y ra. Hy ??a cho ng??i cung c?p d?ch v? y t? c?a mnh danh sch t?t c? cc thu?c, th?o d??c, cc thu?c khng c?n toa, ho?c cc ch? ph?m b? sung m qu v? dng. C?ng nn  bo cho h? bi?t r?ng qu v? c ht thu?c, u?ng r??u, ho?c c s? d?ng ma ty tri php hay khng. Vi th? c th? t??ng tc v?i thu?c c?a qu v?. Ti c?n ph?i theo di ?i?u g trong khi dng thu?c ny? Qu v? s? ???c theo di ch?t ch? trong khi dng thu?c ny. Qu v? c th? c?n ph?i ???c lm xt nghi?m mu trong th?i gian dng thu?c ny. Ti c th? nh?n th?y nh?ng tc d?ng ph? no khi dng thu?c ny? Nh?ng tc d?ng ph? qu v? c?n ph?i bo cho bc s? ho?c chuyn vin y t? cng s?m cng t?t: -cc ph?n ?ng d? ?ng, ch?ng h?n nh? da b? m?n ??, ng?a, n?i my ?ay, s?ng ? m?t, mi, ho?c l??i -ph?ng m?t -y?u c? b?p -cc d?u hi?u v tri?u ch?ng huy?t p th?p, ch?ng h?n nh? chng m?t; c?m th?y chong vng ho?c ng?t x?u, b? ng; c?m th?y y?u ?t ho?c m?t m?i khc th??ng -cc d?u hi?u v tri?u ch?ng c?a s? thay ??i nguy hi?m c?a nh?p tim, ch?ng h?n nh? ?au ng?c, chng m?t,  nh?p tim nhanh ho?c khng ??u, ?nh tr?ng ng?c; cc v?n ?? v? h h?p -v m? hi Danh sch ny c th? khng m t? ?? h?t cc tc d?ng ph? c th? x?y ra. Xin g?i t?i bc s? c?a mnh ?? ???c c? v?n chuyn mn v? cc tc d?ng ph?Sander Nephew v? c th? t??ng trnh cc tc d?ng ph? cho FDA theo s? [phone]. Ti nn c?t gi? thu?c c?a mnh ? ?u? Thu?c ny ???c s? d?ng b?i chuyn vin y t? ? b?nh vi?n ho?c ? phng m?ch. Qu v? s? khng ???c c?p thu?c ny ?? c?t gi? t?i nh. L?U : ?y l b?n tm t?t. N c th? khng bao hm t?t c? thng tin c th? c. N?u qu v? th?c m?c v? thu?c ny, xin trao ??i v?i bc s?, d??c s?, ho?c ng??i cung c?p d?ch v? y t? c?a mnh.    2016, Elsevier/Gold Standard. (2013-06-23 00:00:00)

## 2016-06-03 ENCOUNTER — Other Ambulatory Visit: Payer: Self-pay | Admitting: Hematology

## 2016-06-03 ENCOUNTER — Ambulatory Visit (HOSPITAL_BASED_OUTPATIENT_CLINIC_OR_DEPARTMENT_OTHER): Payer: PPO

## 2016-06-03 ENCOUNTER — Other Ambulatory Visit (HOSPITAL_BASED_OUTPATIENT_CLINIC_OR_DEPARTMENT_OTHER): Payer: PPO

## 2016-06-03 VITALS — BP 93/63 | HR 87 | Temp 98.2°F | Resp 18

## 2016-06-03 DIAGNOSIS — Z95828 Presence of other vascular implants and grafts: Secondary | ICD-10-CM

## 2016-06-03 DIAGNOSIS — D509 Iron deficiency anemia, unspecified: Secondary | ICD-10-CM

## 2016-06-03 LAB — MAGNESIUM: Magnesium: 0.9 mg/dl — CL (ref 1.5–2.5)

## 2016-06-03 MED ORDER — SODIUM CHLORIDE 0.9 % IJ SOLN
10.0000 mL | INTRAMUSCULAR | Status: DC | PRN
  Administered 2016-06-03: 10 mL via INTRAVENOUS
  Filled 2016-06-03: qty 10

## 2016-06-03 MED ORDER — MAGNESIUM SULFATE 50 % IJ SOLN
Freq: Once | INTRAMUSCULAR | Status: AC
  Administered 2016-06-03: 11:00:00 via INTRAVENOUS
  Filled 2016-06-03: qty 250

## 2016-06-03 MED ORDER — HEPARIN SOD (PORK) LOCK FLUSH 100 UNIT/ML IV SOLN
500.0000 [IU] | Freq: Once | INTRAVENOUS | Status: AC | PRN
  Administered 2016-06-03: 500 [IU] via INTRAVENOUS
  Filled 2016-06-03: qty 5

## 2016-06-03 NOTE — Patient Instructions (Signed)

## 2016-06-04 ENCOUNTER — Ambulatory Visit (HOSPITAL_COMMUNITY)
Admission: RE | Admit: 2016-06-04 | Discharge: 2016-06-04 | Disposition: A | Discharge status: Home / Self Care | Payer: PPO | Source: Ambulatory Visit | Attending: Hematology | Admitting: Hematology

## 2016-06-04 ENCOUNTER — Encounter (HOSPITAL_COMMUNITY): Payer: Self-pay | Admitting: Radiology

## 2016-06-04 DIAGNOSIS — R918 Other nonspecific abnormal finding of lung field: Secondary | ICD-10-CM | POA: Insufficient documentation

## 2016-06-04 DIAGNOSIS — K7460 Unspecified cirrhosis of liver: Secondary | ICD-10-CM | POA: Insufficient documentation

## 2016-06-04 DIAGNOSIS — C787 Secondary malignant neoplasm of liver and intrahepatic bile duct: Secondary | ICD-10-CM | POA: Insufficient documentation

## 2016-06-04 DIAGNOSIS — E278 Other specified disorders of adrenal gland: Secondary | ICD-10-CM | POA: Insufficient documentation

## 2016-06-04 DIAGNOSIS — I700 Atherosclerosis of aorta: Secondary | ICD-10-CM | POA: Diagnosis not present

## 2016-06-04 DIAGNOSIS — K766 Portal hypertension: Secondary | ICD-10-CM | POA: Insufficient documentation

## 2016-06-04 DIAGNOSIS — I2510 Atherosclerotic heart disease of native coronary artery without angina pectoris: Secondary | ICD-10-CM | POA: Insufficient documentation

## 2016-06-04 DIAGNOSIS — C187 Malignant neoplasm of sigmoid colon: Secondary | ICD-10-CM

## 2016-06-04 MED ORDER — IOPAMIDOL (ISOVUE-300) INJECTION 61%
100.0000 mL | Freq: Once | INTRAVENOUS | Status: AC | PRN
  Administered 2016-06-04: 80 mL via INTRAVENOUS

## 2016-06-10 ENCOUNTER — Ambulatory Visit (HOSPITAL_BASED_OUTPATIENT_CLINIC_OR_DEPARTMENT_OTHER): Payer: PPO | Admitting: Hematology

## 2016-06-10 ENCOUNTER — Ambulatory Visit (HOSPITAL_BASED_OUTPATIENT_CLINIC_OR_DEPARTMENT_OTHER): Payer: PPO

## 2016-06-10 ENCOUNTER — Other Ambulatory Visit (HOSPITAL_BASED_OUTPATIENT_CLINIC_OR_DEPARTMENT_OTHER): Payer: PPO

## 2016-06-10 VITALS — BP 110/87 | HR 68 | Temp 98.0°F | Resp 18 | Ht 65.0 in | Wt 146.6 lb

## 2016-06-10 DIAGNOSIS — D500 Iron deficiency anemia secondary to blood loss (chronic): Secondary | ICD-10-CM | POA: Diagnosis not present

## 2016-06-10 DIAGNOSIS — Z452 Encounter for adjustment and management of vascular access device: Secondary | ICD-10-CM | POA: Diagnosis not present

## 2016-06-10 DIAGNOSIS — C187 Malignant neoplasm of sigmoid colon: Secondary | ICD-10-CM

## 2016-06-10 DIAGNOSIS — C786 Secondary malignant neoplasm of retroperitoneum and peritoneum: Secondary | ICD-10-CM

## 2016-06-10 DIAGNOSIS — Z5111 Encounter for antineoplastic chemotherapy: Secondary | ICD-10-CM | POA: Diagnosis not present

## 2016-06-10 DIAGNOSIS — D509 Iron deficiency anemia, unspecified: Secondary | ICD-10-CM

## 2016-06-10 DIAGNOSIS — I10 Essential (primary) hypertension: Secondary | ICD-10-CM

## 2016-06-10 DIAGNOSIS — C7800 Secondary malignant neoplasm of unspecified lung: Secondary | ICD-10-CM

## 2016-06-10 DIAGNOSIS — C7972 Secondary malignant neoplasm of left adrenal gland: Secondary | ICD-10-CM

## 2016-06-10 DIAGNOSIS — Z5112 Encounter for antineoplastic immunotherapy: Secondary | ICD-10-CM | POA: Diagnosis not present

## 2016-06-10 DIAGNOSIS — E119 Type 2 diabetes mellitus without complications: Secondary | ICD-10-CM

## 2016-06-10 DIAGNOSIS — Z95828 Presence of other vascular implants and grafts: Secondary | ICD-10-CM

## 2016-06-10 DIAGNOSIS — C787 Secondary malignant neoplasm of liver and intrahepatic bile duct: Secondary | ICD-10-CM | POA: Diagnosis not present

## 2016-06-10 DIAGNOSIS — I5042 Chronic combined systolic (congestive) and diastolic (congestive) heart failure: Secondary | ICD-10-CM

## 2016-06-10 DIAGNOSIS — D6481 Anemia due to antineoplastic chemotherapy: Secondary | ICD-10-CM

## 2016-06-10 LAB — COMPREHENSIVE METABOLIC PANEL
ALBUMIN: 3 g/dL — AB (ref 3.5–5.0)
ALT: 23 U/L (ref 0–55)
AST: 30 U/L (ref 5–34)
Alkaline Phosphatase: 161 U/L — ABNORMAL HIGH (ref 40–150)
Anion Gap: 7 mEq/L (ref 3–11)
BUN: 7.3 mg/dL (ref 7.0–26.0)
CHLORIDE: 109 meq/L (ref 98–109)
CO2: 23 mEq/L (ref 22–29)
CREATININE: 0.7 mg/dL (ref 0.7–1.3)
Calcium: 7.7 mg/dL — ABNORMAL LOW (ref 8.4–10.4)
EGFR: >90 mL/min/{1.73_m2} (ref >90)
GLUCOSE: 125 mg/dL (ref 70–140)
POTASSIUM: 4.2 meq/L (ref 3.5–5.1)
SODIUM: 139 meq/L (ref 136–145)
Total Bilirubin: 0.47 mg/dL (ref 0.20–1.20)
Total Protein: 6.5 g/dL (ref 6.4–8.3)

## 2016-06-10 LAB — CBC WITH DIFFERENTIAL/PLATELET
BASO%: 0.4 % (ref 0.0–2.0)
BASOS ABS: 0 10*3/uL (ref 0.0–0.1)
EOS%: 2.9 % (ref 0.0–7.0)
Eosinophils Absolute: 0.1 10*3/uL (ref 0.0–0.5)
HCT: 25.3 % — ABNORMAL LOW (ref 38.4–49.9)
HEMOGLOBIN: 8.1 g/dL — AB (ref 13.0–17.1)
LYMPH%: 14.7 % (ref 14.0–49.0)
MCH: 27.9 pg (ref 27.2–33.4)
MCHC: 32 g/dL (ref 32.0–36.0)
MCV: 87.2 fL (ref 79.3–98.0)
MONO#: 0.4 10*3/uL (ref 0.1–0.9)
MONO%: 14.4 % — AB (ref 0.0–14.0)
NEUT#: 2 10*3/uL (ref 1.5–6.5)
NEUT%: 67.6 % (ref 39.0–75.0)
Platelets: 130 10*3/uL — ABNORMAL LOW (ref 140–400)
RBC: 2.9 10*6/uL — AB (ref 4.20–5.82)
RDW: 19.1 % — AB (ref 11.0–14.6)
WBC: 2.9 10*3/uL — AB (ref 4.0–10.3)
lymph#: 0.4 10*3/uL — ABNORMAL LOW (ref 0.9–3.3)

## 2016-06-10 LAB — IRON AND TIBC
%SAT: 17 % — ABNORMAL LOW (ref 20–55)
Iron: 31 ug/dL — ABNORMAL LOW (ref 42–163)
TIBC: 175 ug/dL — AB (ref 202–409)
UIBC: 145 ug/dL (ref 117–376)

## 2016-06-10 LAB — MAGNESIUM

## 2016-06-10 LAB — CEA (IN HOUSE-CHCC): CEA (CHCC-IN HOUSE): 780.47 ng/mL — AB (ref 0.00–5.00)

## 2016-06-10 LAB — FERRITIN: Ferritin: 881 ng/ml — ABNORMAL HIGH (ref 22–316)

## 2016-06-10 MED ORDER — MAGNESIUM SULFATE 50 % IJ SOLN
Freq: Once | INTRAMUSCULAR | Status: DC
  Filled 2016-06-10: qty 250

## 2016-06-10 MED ORDER — SODIUM CHLORIDE 0.9 % IV SOLN: Freq: Once | INTRAVENOUS | Status: AC

## 2016-06-10 MED ORDER — SODIUM CHLORIDE 0.9 % IV SOLN
Freq: Once | INTRAVENOUS | Status: AC
  Administered 2016-06-10: 13:00:00 via INTRAVENOUS
  Filled 2016-06-10: qty 4

## 2016-06-10 MED ORDER — SODIUM CHLORIDE 0.9 % IV SOLN
Freq: Once | INTRAVENOUS | Status: AC
  Administered 2016-06-10: 12:00:00 via INTRAVENOUS

## 2016-06-10 MED ORDER — SODIUM CHLORIDE 0.9 % IV SOLN
6.2000 mg/kg | Freq: Once | INTRAVENOUS | Status: AC
  Administered 2016-06-10: 400 mg via INTRAVENOUS
  Filled 2016-06-10: qty 20

## 2016-06-10 MED ORDER — SODIUM CHLORIDE 0.9 % IV SOLN
1800.0000 mg/m2 | INTRAVENOUS | Status: DC
  Administered 2016-06-10: 3050 mg via INTRAVENOUS
  Filled 2016-06-10: qty 61

## 2016-06-10 MED ORDER — SODIUM CHLORIDE 0.9 % IJ SOLN
10.0000 mL | INTRAMUSCULAR | Status: DC | PRN
  Administered 2016-06-10: 10 mL via INTRAVENOUS
  Filled 2016-06-10: qty 10

## 2016-06-10 MED ORDER — SODIUM CHLORIDE 0.9 % IV SOLN
140.0000 mg/m2 | Freq: Once | INTRAVENOUS | Status: AC
  Administered 2016-06-10: 240 mg via INTRAVENOUS
  Filled 2016-06-10: qty 10

## 2016-06-10 MED ORDER — ATROPINE SULFATE 1 MG/ML IJ SOLN
INTRAMUSCULAR | Status: AC
Start: 2016-06-10 — End: 2016-06-10
  Filled 2016-06-10: qty 1

## 2016-06-10 MED ORDER — ATROPINE SULFATE 1 MG/ML IJ SOLN
0.5000 mg | Freq: Once | INTRAMUSCULAR | Status: AC | PRN
  Administered 2016-06-10: 0.5 mg via INTRAVENOUS

## 2016-06-10 MED ORDER — ALTEPLASE 2 MG IJ SOLR
2.0000 mg | Freq: Once | INTRAMUSCULAR | Status: DC | PRN
  Filled 2016-06-10: qty 2

## 2016-06-10 NOTE — Patient Instructions (Signed)
[facility] Discharge Instructions for Patients Receiving Chemotherapy  Today you received the following chemotherapy agents:  Irinotecan, Fluorouracil  To help prevent nausea and vomiting after your treatment, we encourage you to take your nausea medication as prescribed.   If you develop nausea and vomiting that is not controlled by your nausea medication, call the clinic.   BELOW ARE SYMPTOMS THAT SHOULD BE REPORTED IMMEDIATELY:  *FEVER GREATER THAN 100.5 F  *CHILLS WITH OR WITHOUT FEVER  NAUSEA AND VOMITING THAT IS NOT CONTROLLED WITH YOUR NAUSEA MEDICATION  *UNUSUAL SHORTNESS OF BREATH  *UNUSUAL BRUISING OR BLEEDING  TENDERNESS IN MOUTH AND THROAT WITH OR WITHOUT PRESENCE OF ULCERS  *URINARY PROBLEMS  *BOWEL PROBLEMS  UNUSUAL RASH Items with * indicate a potential emergency and should be followed up as soon as possible.  Feel free to call the clinic you have any questions or concerns. The clinic phone number is [phone].  Please show the [place] at check-in to the Emergency Department and triage nurse.

## 2016-06-10 NOTE — Patient Instructions (Signed)

## 2016-06-10 NOTE — Progress Notes (Signed)
[place]  Telephone:([phone]) [phone] Fax:(336) [phone]  Clinic follow up Note   Patient Care Team: [name], MD as PCP - General (Specialist) [name], CRNP as Nurse Practitioner (Nurse Practitioner) Truitt Merle, MD as Consulting Physician (Hematology) Ladene Artist, MD as Consulting Physician (Gastroenterology) 06/10/2016  CHIEF COMPLAINTS Follow up metastatic sigmoid colon cancer  Oncology History   He is a Metastatic colon cancer to liver   Staging form: Colon and Rectum, AJCC 7th Edition     Clinical: Stage Unknown ([place], NX, M1) - Unsigned      Cancer of sigmoid colon ([place])   10/28/2014 Tumor Marker    AFP 3.2 CEA > 10,000 CA 19-9 12,929.6. tumor (-) KRAS and NRAS mutation.      11/10/2014 Imaging    PET scan showed hypermetabolic mass in the sigmoid colon was noted metastasis in the retroperitoneum. Probable left adrenal and pulmonary metastasis, and diffuse liver metastasis.     11/21/2014 Initial Diagnosis    Metastatic colon cancer to liver, lung, abd nodes and left adrenal gland. Diagnosis was made by liver biopsy.      11/30/2014 -  Chemotherapy    First line chemo [id number], Panitumumab added from second cycle      12/28/2014 - 12/31/2014 Hospital Admission    Was admitted for dehydration, neutropenia fever with UTI, and severe skin rash.     02/08/2015 - 02/12/2015 Hospital Admission    He was admitted for upper GI bleeding, e.g. showed a gastric ulcer with clots, status post appendectomy injection. He also received a blood transfusion.     03/01/2015 Tumor Marker    CEA 694, CA19.9 462     03/13/2015 Imaging    CT CAP showed partial response, no new lesions.      03/15/2015 - 05/23/2015 Chemotherapy    restart FOLFOX, held again on 8/9 due to GI bleeding      05/31/2015 - 06/02/2015 Hospital Admission    He was admitted to [facility] in [place] due to upper GI bleeding, EGD showed gastric and dudenal ulcers      06/27/2015 - 06/29/2015 Hospital  Admission    he was admitted for dairrhea and pancolitis, c-diff and stool cultures were negative, treated with antibiotics     07/12/2015 -  Chemotherapy    panitumumab 22m/kg, every 2 weeks     11/08/2015 - 11/10/2015 Hospital Admission    Pt was admitted for sepsis and hypotension, was found to have (+) influenza A and treated.      11/30/2015 Imaging     CT scans reviewed continued improvement in the liver metastasis, no other new lesions.       HISTORY OF PRESENTING ILLNESS:  Jason Reilly 70y.o. male is here because of abnormal CT findings, which is very suspicious for malignancy. [place] He came in with his son and an interpreter.  He has been feeling fatigued since two month ago. He is still able to do all ADLs. He otherwise denies any pain, bloating or nausea.  He lost about 20lbs in 3 month. His appetite is lower than before, eats less, no change of his bowl habits.  She denied any hematochezia or melana. Per his son, he has had some personality changes daily, irritable, slightly confused some time.  He was evaluated by his primary care physician. Lab test reviewed hepatitis B infection, which he did not know before,  and elevated alkaline phosphatase, his liver function and the rest of the liver function was not remarkable. [place] of abdomen was obtained on 07/22/2014, which showed diffusely abnormal liver with multiple echogenic lesions. CT of abdomen with and without contrast was done on 08/26/2014, which reviewed here at a medically with multiple large partially calcified hepatic masses consistent with metastatic disease. Mild retroperitoneal adenopathy with the largest node measuring 1.6 cm. And nonspecific 1.4 cm left adrenal nodule was also noticed. His tumor marker showed CEA greater than 10,000, CA 19-9 12,929, AFP 3.2 (normal). He was referred to [facility] liver clinic and was evaluated by nurse practitioner  [name]. Treatment for hepatitis B was not recommended based on his virus load.  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 He was evaluated by a cardiologist in 2014. He denies any significant dyspnea on exertion. No leg swollen.  CURRENT THERAPY: panitumumab 25m/kg, every 2 weeks, started on 07/12/2015, Irinotecan 1818mm2 every 2 weeks added on 09/19/2015, dose decreased to 16063m2 from cycle 2, and further decreased to 125m8m from cycle 10 due to diarrhea and anorexia, changed to 140mg34mfrom cycle 13.  He also receives IV magnesium 6g weekly   INTERIM HISTORY: Dyer Chonrns for follow-up and chemotherapy. He is accompanied by his son. He is doing well overall. He has good appetite, mild fatigue but functions well at home. No significant nausea, diarrhea, or other complaints. He is tolerating chemotherapy well. His weight is stable, he drinks ensure once daily  MEDICAL HISTORY:  Past Medical History:  Diagnosis Date  . Abnormal EKG   . Diabetes mellitus without complication (HCC)    metformin  . Gastric ulcer   . H. pylori infection   . Hepatitis B   . Hypertension   . Metastatic colon cancer to liver (HCC) [place] Non-ischemic cardiomyopathy (HCC) [place] Tachycardia     SURGICAL HISTORY: Past Surgical History:  Procedure Laterality Date  . ESOPHAGOGASTRODUODENOSCOPY N/A 02/08/2015   Procedure: ESOPHAGOGASTRODUODENOSCOPY (EGD);  Surgeon: MalcoLadene Artist  [facility];  Service: Endoscopy;  Laterality: N/A;  . ESOPHAGOGASTRODUODENOSCOPY (EGD) WITH PROPOFOL N/A 08/15/2015   Procedure: ESOPHAGOGASTRODUODENOSCOPY (EGD) WITH PROPOFOL;  Surgeon: MalcoLadene Artist  [facility];  Service: Endoscopy;  Laterality: N/A;  . LEFT AND RIGHT HEART CATHETERIZATION WITH CORONARY ANGIOGRAM N/A 03/29/2013   Procedure: LEFT AND RIGHT HEART CATHETERIZATION WITH CORONARY ANGIOGRAM;  Surgeon: KennePixie Casino  [facility];   Service: Cardiovascular;  Laterality: N/A;  . Nuclear Stress Test  03/03/2013   High risk - consistent with nonischemic cardiomyopathy    SOCIAL HISTORY: Social History   Social History  . Marital status: Married    Spouse name: N/A  . Number of children: N/A  . Years of education: N/A   Occupational History  . Not on file.   Social History Main Topics  . Smoking status: Former Smoker    Types: Cigarettes    Quit date: 11/07/2008  . Smokeless tobacco: Never Used  . Alcohol use Yes     Comment: occasional  . Drug use: No  . Sexual activity: No   Other Topics Concern  . Not on file   Social History Narrative   Married   Enjoys walking   Has lived in [place]    FAMILY HISTORY: No family history of liver disease or malignancy.  ALLERGIES:  has No Known Allergies.  MEDICATIONS:  Current Outpatient Prescriptions on File Prior to Visit  Medication Sig Dispense Refill  . clindamycin (CLINDAGEL) 1 % gel Apply topically 2 (two) times daily. 30 g 2  . diphenoxylate-atropine (LOMOTIL) 2.5-0.025 MG tablet Take 1-2 tablets by mouth 4 (four) times daily as needed for diarrhea or loose stools. 30 tablet 0  . folic acid (FOLVITE) 1 MG tablet Take 1 tablet (1 mg total) by mouth daily. 30 tablet 3  . hydrocortisone 1 % ointment Apply 1 application topically 2 (two) times daily. 56 g 0  . lidocaine-prilocaine (EMLA) cream Apply 1 application topically as needed. Apply to [facility] cath at least one hour before needle stick. 30 g 2  . lisinopril-hydrochlorothiazide (PRINZIDE,ZESTORETIC) 10-12.5 MG tablet     . loperamide (IMODIUM) 2 MG capsule Take 2 capsules (4 mg total) by mouth 4 (four) times daily as needed for diarrhea or loose stools (NO MORE THAN 8 TABLETS PER DAY.). 60 capsule 3  . magic mouthwash SOLN Take 5 mLs by mouth 4 (four) times daily. 120 mL 1  . magnesium oxide (MAG-OX) 400 (241.3 Mg) MG tablet Take 1 tablet (400 mg total) by mouth 3 (three) times daily. 60 tablet 3    . metFORMIN (GLUCOPHAGE) 500 MG tablet Take 1 tablet (500 mg total) by mouth 2 (two) times daily with a meal. 60 tablet 0  . nadolol (CORGARD) 20 MG tablet Take 1 tablet (20 mg total) by mouth 2 (two) times daily. THIS IS A ONE TIME ORDER.  FUTURE REFILLS NEED TO BE DONE BY PRIMARY MD. 60 tablet 1  . ondansetron (ZOFRAN) 8 MG tablet Take 1 tablet (8 mg total) by mouth every 8 (eight) hours as needed for nausea or vomiting. 45 tablet 2  . pantoprazole (PROTONIX) 40 MG tablet Take 1 tablet (40 mg total) by mouth daily. 30 tablet 2  . prochlorperazine (COMPAZINE) 10 MG tablet Take 1 tablet (10 mg total) by mouth every 6 (six) hours as needed for nausea or vomiting. 30 tablet 2   Current Facility-Administered Medications on File Prior to Visit  Medication Dose Route Frequency Provider Last Rate Last Dose  . 0.9 %  sodium chloride infusion   Intravenous Once Truitt Merle, MD      . ferumoxytol [facility]) 510 mg in sodium chloride 0.9 % 100 mL IVPB  510 mg Intravenous Once Truitt Merle, MD      . sodium chloride 0.9 % injection 10 mL  10 mL Intracatheter PRN Truitt Merle, MD   10 mL at 10/03/15 1705  . sodium chloride 0.9 % injection 10 mL  10 mL Intravenous PRN Truitt Merle, MD   10 mL at 05/14/16 1127  ;   REVIEW OF SYSTEMS:   Constitutional: Denies fevers, chills or abnormal night sweats, (+) fatigue  Eyes: Denies blurriness of vision, double vision or watery eyes Ears, nose, mouth, throat, and face: Denies mucositis or sore throat Respiratory: Denies cough, dyspnea or wheezes Cardiovascular: Denies palpitation, chest discomfort or lower extremity swelling Gastrointestinal: Denies nausea, heartburn or change in bowel habits Skin: Denies abnormal skin rashes Lymphatics: Denies new lymphadenopathy or easy bruising Neurological:Denies numbness, tingling or new weaknesses Behavioral/Psych: Mood is stable, no new changes, (+) insomnia All other systems were reviewed with the patient and are negative.  PHYSICAL  EXAMINATION: BP 110/87 (BP Location: Left Arm, Patient Position: Sitting)   Pulse 68   Temp 98 F (36.7 C) (Oral)   Resp 18   Ht 5' 5" (1.651 m)   Wt  146 lb 9.6 oz (66.5 kg)   SpO2 100%   BMI 24.40 kg/m   ECOG PERFORMANCE STATUS: 1 Vital sign were taken at in the infusion room, within normal limits. GENERAL:alert, no distress and comfortable SKIN: (+) Dry skin,  no skin ulcer, (+) scatter skin rashes on his neck and upper chest, some are resolving with skin pigmentation. No discharge. EYES: normal, conjunctiva are pink and non-injected, sclera clear OROPHARYNX:no exudate, no erythema and lips, buccal mucosa, and tongue with mild discoloration at the tip.  NECK: supple, thyroid normal size, non-tender, without nodularity LYMPH:  no palpable lymphadenopathy in the cervical, axillary or inguinal LUNGS: clear to auscultation and percussion with normal breathing effort, no rales   HEART: regular rate & rhythm and no murmurs and no lower extremity edema ABDOMEN:abdomen soft, non-tender, no hepatomegaly, no splenomegaly and normal bowel sounds Musculoskeletal:no cyanosis of digits and no clubbing  PSYCH: alert & oriented x 3 with fluent speech NEURO: no focal motor/sensory deficits  LABORATORY DATA:  I have reviewed the data as listed CBC Latest Ref Rng & Units 06/10/2016 05/27/2016 05/20/2016  WBC 4.0 - 10.3 10e3/uL 2.9(L) 3.9(L) 3.4(L)  Hemoglobin 13.0 - 17.1 g/dL 8.1(L) 9.3(L) 8.4(L)  Hematocrit 38.4 - 49.9 % 25.3(L) 28.3(L) 25.2(L)  Platelets 140 - 400 10e3/uL 130(L) 168 130(L)    CMP Latest Ref Rng & Units 06/10/2016 05/27/2016 05/13/2016  Glucose 70 - 140 mg/dl 125 79 173(H)  BUN 7.0 - 26.0 mg/dL 7.3 8.1 5.9(L)  Creatinine 0.7 - 1.3 mg/dL 0.7 0.8 0.8  Sodium 136 - 145 mEq/L 139 142 140  Potassium 3.5 - 5.1 mEq/L 4.2 4.2 4.3  Chloride 101 - 111 mmol/L - - -  CO2 22 - 29 mEq/L _0 Calcium 8.4 - 10.4 mg/dL 7.7(L) 8.9 8.5  Total Protein 6.4 - 8.3 g/dL 6.5 7.4 7.0  Total  Bilirubin 0.20 - 1.20 mg/dL 0.47 0.46 0.50  Alkaline Phos 40 - 150 U/L 161(H) 194(H) 178(H)  AST 5 - 34 U/L 30 32 30  ALT 0 - 55 U/L _1 INITIAL tumor markers AFP 3.2 CEA > 10,000 CA 19-9 12,929.6  Results for KOY, LAMP (MRN [id number]) as of 06/10/2016 07:51  Ref. Range 03/29/2015 13:29 05/09/2015 13:45 06/06/2015 15:06 07/05/2015 08:11 08/09/2015 08:40 09/06/2015 09:57 10/03/2015 08:49 10/31/2015 08:01 01/08/2016 09:53 02/19/2016 08:24 03/12/2016 13:11 04/09/2016 10:22 05/06/2016 13:23  CEA Latest Units: ng/mL 556.6 (H) 249.5 (H) 90.7 (H) 386.8 (H) 96.3 (H) 73.7 (H) 66.4 (H) 50.6 (H) 80.3 (H) 134.6 (H)     CEA Latest Ref Range: 0.0 - 4.7 ng/mL         110.6 (H) 155.8 (H) 221.4 (H) 289.7 (H) 566.1 (H)     Pathology report  Liver, needle/core biopsy - METASTATIC ADENOCARCINOMA, SEE COMMENT. Microscopic Comment The adenocarcinoma demonstrates the following immunophenotype: Cytokeratin 7 - negative expression. Cytokeratin 20 - strong diffuse expression. CD2 - strong diffuse expression. Overall the morphology and immunophenotype are that of metastatic adenocarcinoma primary to colorectum. The recent nuclear medicine scan demonstrating sigmoid mass with associated liver masses is noted.  [employer] test result:    RADIOGRAPHIC STUDIES: I have personally reviewed the outside CT scan image with patient and his son.   CT chest, abdomen and pelvis with IV contrast oN  06/04/2016 IMPRESSION: 1. Similar to mild increase in hepatic metastasis. 2. Primarily similar pulmonary nodules. A left upper lobe nodule has resolved. 3. Mild enlargement of a right adrenal nodule. 4. No  new sites of metastatic disease identified. 5.  Coronary artery atherosclerosis. Aortic atherosclerosis. 6. 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 Recannulized paraumbilical vein is consistent with chronic portal venous hypertension.   ASSESSMENT & PLAN:  70 year old [place] male, with  past history of hypertension and dilated nonischemic gammopathy with EF 25%, no clinical signs of heart failure, who was found to have hepatitis B infection lately, and multiple liver lesions on the CT scan. He has extremely high CEA and CA 19-9 levels. PET scan reviewed a hypermetabolic sigmoid colon mass, diffuse liver metastasis, probable lung and adrenal gland metastasis.  1. Metastatic sigmoid colon cancer, with diffuse liver, lungs, node and left adrenal gland metastases. KRAS/NRAS wild type, MSI-stable -Liver biopsy showed metastatic adenocarcinoma. His tumor were strongly positive for CK20 and CD2, consistent with primary colorectal primary. KRAS and NRAS mutations were not detected.  -Pt understands that this is incurable cancer, and he has very high disease burden and overall prognosis is poor. The treatment goal is palliative -He had excellent response to first-line FOLFOX, but treatment was complicated with GI bleeding and neutropenic fever, resolved now. -he is currently on second line chemotherapy with irritecan and panitumumab, tolerating well  - I reviewed his restaging CT scan from 02/29/2016, which showed a continuous responding in liver metastasis, no other new lesions. -here to severe diarrhea from Irinotecan, dose has been reduced from 245m (1684mm2) to 20021m125m67m, rounded), which has been increased to 240 mg (140mg24m from next cycle 13 and he tolerated well, we'll continue at the current dose. -His tumor marker CEA has been trending up, concerning for disease progression, we'll continue monitoring. -I discussed his restaging CT from 06/04/2016 which showed overall stable disease, mild increase in hepatic metastasis. -I recommend him to add 5-FU back, and continue irinotecan and panitumumab, he agrees. -He is clinical doing well, lab reviewed, adequate for treatment, we'll proceed chemo   2. Grade 1-2 skin rashes, stable  -Secondary to panitumumab, slightly worse  today -continue hydrocortisone 2.5%, and clindamycin gel 1%  twice daily as needed  -He knows to avoid sun exposure, and call me if it gets worse.  3. Gastric ulcer with significant GI bleeding in April and Aug 2016 -He is on PPI, continue once daily  -Repeat his EGD on 08/15/2015 showed near complete healing of his gastric ulcer -continue Nadolol 20mg 55m per Dr. Stark Fuller Planncourage him to follow-up [facility] since he will resume 5-FU  4. Type 2 DM  -HbA1c was 8.5, blood glucose not well controlled, he run out of metformin  -I encourage him to follow-up with his primary care physician Dr. WilliaJimmye Normancall his office for metformin refill today   5. HTN, Dilated nonischemic ischemia cardiomyopathy with EF 25% -He is clinically doing well without symptoms of CHF. However this is probably going to impact his chemotherapy.Will try to avoid cardiotoxic chemotherapy agent and avoid fluid overload during chemotherapy. -Continue follow-up with cardiology. -will hold on his lisinopril-HCTZ for now, his BP has been normal lately   6 Hepatitis B carrier, with mild portal hypertension  -Per liver clinic, no need for treatment. Follow-up with Dr. StarksSilvio PateMalnutrition -I encouraged him to eat more, and take supplements as needed. -improved, gained weight back after I reduce his chemotherapy dose  -follow up with Dietitian   8. Anemia secondary to GI bleeding, iron deficiency and chemo  -Repeat lab on 06/06/2015 showed ferritin 117, serum iron  24, saturation 8%, which supports iron deficiency -He received IV Feraheme again in Aug 2016 after GI bleeding  -Repeat a ferritin was 273 on 10/03/2015, much improved  -he received iv feraheme again on 11/27/2015 and 12/04/2015, but anemia did not improve much. -His anemia has been getting worse lately, probably related to chemotherapy, we'll close monitor any signs of GI bleeding. -Blood transfusion if hemoglobin less  than 8 or symptomatic anemia with hemoglobin 8-9 -Hemoglobin 8.1 today, not symptomatic, will continue monitoring  11. hypomagnesemia  -secondary to panitumumab -he receives IV mag 6 g every week -He is taking magnesium pill 2 tablets 3 times a day -follow up weekly    Plan -restaging CT scan are reviewed, mild disease progression. Will add 5-FU infusion back, and continue irinotecan and panitumumab today and continue every 2 weeks.  -magnesium infusion weekly on Wednesday  -I'll see him back in 2 weeks   All questions were answered. The patient knows to call the clinic with any problems, questions or concerns.  I spent 25 minutes counseling the patient face to face. The total time spent in the appointment was 30 minutes and more than 50% was on counseling.     Truitt Merle, MD 06/10/16  is

## 2016-06-11 LAB — CEA: CEA1: 639.7 ng/mL — AB (ref 0.0–4.7)

## 2016-06-12 ENCOUNTER — Ambulatory Visit (HOSPITAL_BASED_OUTPATIENT_CLINIC_OR_DEPARTMENT_OTHER): Payer: PPO

## 2016-06-12 VITALS — BP 123/57 | HR 63 | Temp 98.7°F | Resp 17

## 2016-06-12 DIAGNOSIS — Z95828 Presence of other vascular implants and grafts: Secondary | ICD-10-CM

## 2016-06-12 DIAGNOSIS — D509 Iron deficiency anemia, unspecified: Secondary | ICD-10-CM

## 2016-06-12 MED ORDER — HEPARIN SOD (PORK) LOCK FLUSH 100 UNIT/ML IV SOLN
500.0000 [IU] | Freq: Once | INTRAVENOUS | Status: AC | PRN
  Administered 2016-06-12: 500 [IU] via INTRAVENOUS
  Filled 2016-06-12: qty 5

## 2016-06-12 MED ORDER — MAGNESIUM SULFATE 50 % IJ SOLN
Freq: Once | INTRAMUSCULAR | Status: AC
  Administered 2016-06-12: 15:00:00 via INTRAVENOUS
  Filled 2016-06-12: qty 250

## 2016-06-12 MED ORDER — SODIUM CHLORIDE 0.9 % IJ SOLN
10.0000 mL | INTRAMUSCULAR | Status: DC | PRN
  Administered 2016-06-12: 10 mL via INTRAVENOUS
  Filled 2016-06-12: qty 10

## 2016-06-12 NOTE — Patient Instructions (Signed)
Magnesium Sulfate injection ?y l thu?c g? MAGNESIUM SULFATE l ch?t ?i?n gi?i d?ng tim th??ng ???c dng ?? ?i?u tr? m?c magnesium th?p ? trong mu v ?? phng ng?a ho?c ki?m sot m?t s? lo?i co gi?t. Thu?c ny c th? ???c dng cho nh?ng m?c ?ch khc; hy h?i ng??i cung c?p d?ch v? y t? ho?c d??c s? c?a mnh, n?u qu v? c th?c m?c. Ti c?n ph?i bo cho ng??i cung c?p d?ch v? y t? c?a mnh ?i?u g tr??c khi dng thu?c ny? H? c?n bi?t li?u qu v? c b?t k? tnh tr?ng no sau ?y khng: -b?nh tim -ti?n s? tim ??p khng ??u -b?nh th?n -pha?n ??ng b?t th???ng ho??c di? ??ng v??i magnesium sulfate ho?c cc lo?i thu?c -pha?n ??ng b?t th???ng ho??c di? ??ng v??i th??c ph?m, thu?c nhu?m, ho??c ch?t ba?o qua?n -?ang c thai ho??c ??nh co? thai -?ang cho con bu? Ti nn s? d?ng thu?c ny nh? th? no? Thu?c ny ?? truy?n vo t?nh m?ch. Thu?c ny ???c s? d?ng b?i chuyn vin y t? ? b?nh vi?n ho?c ? phng m?ch. Hy bn v?i bc s? nhi khoa c?a qu v? v? vi?c dng thu?c ny ? tr? em. Thu?c ny c th? ???c k toa trong nh?ng tr??ng h?p ch?n l?c, nh?ng c?n ph?i th?n tr?ng. Qu li?u: N?u qu v? cho r?ng mnh ? dng qu nhi?u thu?c ny, th hy lin l?c v?i trung tm ki?m sot ch?t ??c ho?c phng c?p c?u ngay l?p t?c. L?U : Thu?c ny ch? dnh ring cho qu v?. Khng chia s? thu?c ny v?i nh?ng ng??i khc. N?u ti l? qun m?t li?u th sao? ?i?u ny khng p d?ng. Nh?ng g c th? t??ng tc v?i thu?c ny? Thu?c ny c th? t??ng tc v?i cc thu?c sau ?y: -m?t s? thu?c dng cho ch?ng lo u ho?c cc v?n ?? v? gi?c ng? -m?t s? thu?c dng cho cc ch?ng co gi?t, ch?ng h?n nh? phenobarbital -digoxin -cc thu?c lm gin c? ?? gi?i ph?u -cc thu?c gi?m ?au gy ng? (narcotic) Danh sch ny c th? khng m t? ?? h?t cc t??ng tc c th? x?y ra. Hy ??a cho ng??i cung c?p d?ch v? y t? c?a mnh danh sch t?t c? cc thu?c, th?o d??c, cc thu?c khng c?n toa, ho?c cc ch? ph?m b? sung m qu v? dng. C?ng nn  bo cho h? bi?t r?ng qu v? c ht thu?c, u?ng r??u, ho?c c s? d?ng ma ty tri php hay khng. Vi th? c th? t??ng tc v?i thu?c c?a qu v?. Ti c?n ph?i theo di ?i?u g trong khi dng thu?c ny? Qu v? s? ???c theo di ch?t ch? trong khi dng thu?c ny. Qu v? c th? c?n ph?i ???c lm xt nghi?m mu trong th?i gian dng thu?c ny. Ti c th? nh?n th?y nh?ng tc d?ng ph? no khi dng thu?c ny? Nh?ng tc d?ng ph? qu v? c?n ph?i bo cho bc s? ho?c chuyn vin y t? cng s?m cng t?t: -cc ph?n ?ng d? ?ng, ch?ng h?n nh? da b? m?n ??, ng?a, n?i my ?ay, s?ng ? m?t, mi, ho?c l??i -ph?ng m?t -y?u c? b?p -cc d?u hi?u v tri?u ch?ng huy?t p th?p, ch?ng h?n nh? chng m?t; c?m th?y chong vng ho?c ng?t x?u, b? ng; c?m th?y y?u ?t ho?c m?t m?i khc th??ng -cc d?u hi?u v tri?u ch?ng c?a s? thay ??i nguy hi?m c?a nh?p tim, ch?ng h?n nh? ?au ng?c, chng m?t,  nh?p tim nhanh ho?c khng ??u, ?nh tr?ng ng?c; cc v?n ?? v? h h?p -v m? hi Danh sch ny c th? khng m t? ?? h?t cc tc d?ng ph? c th? x?y ra. Xin g?i t?i bc s? c?a mnh ?? ???c c? v?n chuyn mn v? cc tc d?ng ph?Sander Nephew v? c th? t??ng trnh cc tc d?ng ph? cho FDA theo s? [phone]. Ti nn c?t gi? thu?c c?a mnh ? ?u? Thu?c ny ???c s? d?ng b?i chuyn vin y t? ? b?nh vi?n ho?c ? phng m?ch. Qu v? s? khng ???c c?p thu?c ny ?? c?t gi? t?i nh. L?U : ?y l b?n tm t?t. N c th? khng bao hm t?t c? thng tin c th? c. N?u qu v? th?c m?c v? thu?c ny, xin trao ??i v?i bc s?, d??c s?, ho?c ng??i cung c?p d?ch v? y t? c?a mnh.    2016, Elsevier/Gold Standard. (2013-06-23 00:00:00)

## 2016-06-19 ENCOUNTER — Ambulatory Visit: Payer: Medicaid Other

## 2016-06-19 ENCOUNTER — Telehealth: Payer: Self-pay | Admitting: *Deleted

## 2016-06-19 ENCOUNTER — Encounter: Payer: Self-pay | Admitting: Hematology

## 2016-06-19 NOTE — Telephone Encounter (Signed)
"  Caller asked to speak with [name].  Tried further assessment unsuccessfully.  Call transferred to ext [phone].

## 2016-06-19 NOTE — Telephone Encounter (Signed)
Called pt at home and spoke with daughter - who does not speak [place] well.   Left message with daughter to have pt call collaborative nurse back. Called pt on cell phone again, and left message requesting a call back from pt to nurse ASAP for infusion of Magnesium today at 115 pm.

## 2016-06-19 NOTE — Telephone Encounter (Signed)
Spoke with pt and gave pt appt date and time for Magnesium infusion on Friday 8/25 at 1130 am.   Pt voiced understanding.

## 2016-06-21 ENCOUNTER — Ambulatory Visit (HOSPITAL_BASED_OUTPATIENT_CLINIC_OR_DEPARTMENT_OTHER): Payer: PPO

## 2016-06-21 VITALS — BP 121/64 | HR 57 | Temp 98.5°F | Resp 18

## 2016-06-21 DIAGNOSIS — D509 Iron deficiency anemia, unspecified: Secondary | ICD-10-CM

## 2016-06-21 MED ORDER — SODIUM CHLORIDE 0.9% FLUSH
10.0000 mL | Freq: Once | INTRAVENOUS | Status: AC
  Administered 2016-06-21: 10 mL via INTRAVENOUS
  Filled 2016-06-21: qty 10

## 2016-06-21 MED ORDER — ACETAMINOPHEN 325 MG PO TABS
650.0000 mg | ORAL_TABLET | Freq: Once | ORAL | Status: AC
  Administered 2016-06-21: 650 mg via ORAL

## 2016-06-21 MED ORDER — ACETAMINOPHEN 325 MG PO TABS
ORAL_TABLET | ORAL | Status: AC
  Filled 2016-06-21: qty 2

## 2016-06-21 MED ORDER — SODIUM CHLORIDE 0.9 % IV SOLN
Freq: Once | INTRAVENOUS | Status: AC
  Administered 2016-06-21: 12:00:00 via INTRAVENOUS
  Filled 2016-06-21: qty 250

## 2016-06-21 MED ORDER — HEPARIN SOD (PORK) LOCK FLUSH 100 UNIT/ML IV SOLN
500.0000 [IU] | Freq: Once | INTRAVENOUS | Status: AC
  Administered 2016-06-21: 500 [IU] via INTRAVENOUS
  Filled 2016-06-21: qty 5

## 2016-06-21 NOTE — Patient Instructions (Signed)
Magnesium Sulfate injection ?y l thu?c g? MAGNESIUM SULFATE l ch?t ?i?n gi?i d?ng tim th??ng ???c dng ?? ?i?u tr? m?c magnesium th?p ? trong mu v ?? phng ng?a ho?c ki?m sot m?t s? lo?i co gi?t. Thu?c ny c th? ???c dng cho nh?ng m?c ?ch khc; hy h?i ng??i cung c?p d?ch v? y t? ho?c d??c s? c?a mnh, n?u qu v? c th?c m?c. Ti c?n ph?i bo cho ng??i cung c?p d?ch v? y t? c?a mnh ?i?u g tr??c khi dng thu?c ny? H? c?n bi?t li?u qu v? c b?t k? tnh tr?ng no sau ?y khng: -b?nh tim -ti?n s? tim ??p khng ??u -b?nh th?n -pha?n ??ng b?t th???ng ho??c di? ??ng v??i magnesium sulfate ho?c cc lo?i thu?c -pha?n ??ng b?t th???ng ho??c di? ??ng v??i th??c ph?m, thu?c nhu?m, ho??c ch?t ba?o qua?n -?ang c thai ho??c ??nh co? thai -?ang cho con bu? Ti nn s? d?ng thu?c ny nh? th? no? Thu?c ny ?? truy?n vo t?nh m?ch. Thu?c ny ???c s? d?ng b?i chuyn vin y t? ? b?nh vi?n ho?c ? phng m?ch. Hy bn v?i bc s? nhi khoa c?a qu v? v? vi?c dng thu?c ny ? tr? em. Thu?c ny c th? ???c k toa trong nh?ng tr??ng h?p ch?n l?c, nh?ng c?n ph?i th?n tr?ng. Qu li?u: N?u qu v? cho r?ng mnh ? dng qu nhi?u thu?c ny, th hy lin l?c v?i trung tm ki?m sot ch?t ??c ho?c phng c?p c?u ngay l?p t?c. L?U : Thu?c ny ch? dnh ring cho qu v?. Khng chia s? thu?c ny v?i nh?ng ng??i khc. N?u ti l? qun m?t li?u th sao? ?i?u ny khng p d?ng. Nh?ng g c th? t??ng tc v?i thu?c ny? Thu?c ny c th? t??ng tc v?i cc thu?c sau ?y: -m?t s? thu?c dng cho ch?ng lo u ho?c cc v?n ?? v? gi?c ng? -m?t s? thu?c dng cho cc ch?ng co gi?t, ch?ng h?n nh? phenobarbital -digoxin -cc thu?c lm gin c? ?? gi?i ph?u -cc thu?c gi?m ?au gy ng? (narcotic) [facility] sch ny c th? khng m t? ?? h?t cc t??ng tc c th? x?y ra. Hy ??a cho ng??i cung c?p d?ch v? y t? c?a mnh danh sch t?t c? cc thu?c, th?o d??c, cc thu?c khng c?n toa, ho?c cc ch? ph?m b? sung m qu v? dng. C?ng nn  bo cho h? bi?t r?ng qu v? c ht thu?c, u?ng r??u, ho?c c s? d?ng ma ty tri php hay khng. Vi th? c th? t??ng tc v?i thu?c c?a qu v?. Ti c?n ph?i theo di ?i?u g trong khi dng thu?c ny? Qu v? s? ???c theo di ch?t ch? trong khi dng thu?c ny. Qu v? c th? c?n ph?i ???c lm xt nghi?m mu trong th?i gian dng thu?c ny. Ti c th? nh?n th?y nh?ng tc d?ng ph? no khi dng thu?c ny? Nh?ng tc d?ng ph? qu v? c?n ph?i bo cho bc s? ho?c chuyn vin y t? cng s?m cng t?t: -cc ph?n ?ng d? ?ng, ch?ng h?n nh? da b? m?n ??, ng?a, n?i my ?ay, s?ng ? m?t, mi, ho?c l??i -ph?ng m?t -y?u c? b?p -cc d?u hi?u v tri?u ch?ng huy?t p th?p, ch?ng h?n nh? chng m?t; c?m th?y chong vng ho?c ng?t x?u, b? ng; c?m th?y y?u ?t ho?c m?t m?i khc th??ng -cc d?u hi?u v tri?u ch?ng c?a s? thay ??i nguy hi?m c?a nh?p tim, ch?ng h?n nh? ?au ng?c, chng m?t,  nh?p tim nhanh ho?c khng ??u, ?nh tr?ng ng?c; cc v?n ?? v? h h?p -v m? hi [facility] sch ny c th? khng m t? ?? h?t cc tc d?ng ph? c th? x?y ra. Xin g?i t?i bc s? c?a mnh ?? ???c c? v?n chuyn mn v? cc tc d?ng ph?Jason Reilly v? c th? t??ng trnh cc tc d?ng ph? cho FDA theo s? [phone]. Ti nn c?t gi? thu?c c?a mnh ? ?u? Thu?c ny ???c s? d?ng b?i chuyn vin y t? ? b?nh vi?n ho?c ? phng m?ch. Qu v? s? khng ???c c?p thu?c ny ?? c?t gi? t?i nh. L?U : ?y l b?n tm t?t. N c th? khng bao hm t?t c? thng tin c th? c. N?u qu v? th?c m?c v? thu?c ny, xin trao ??i v?i bc s?, d??c s?, ho?c ng??i cung c?p d?ch v? y t? c?a mnh.    2016, Elsevier/Gold Standard. (2013-06-23 00:00:00)

## 2016-06-24 ENCOUNTER — Other Ambulatory Visit: Payer: Medicaid Other

## 2016-06-24 ENCOUNTER — Ambulatory Visit: Payer: Medicaid Other | Admitting: Hematology

## 2016-06-25 ENCOUNTER — Encounter: Payer: Self-pay | Admitting: Hematology

## 2016-06-25 ENCOUNTER — Ambulatory Visit (HOSPITAL_BASED_OUTPATIENT_CLINIC_OR_DEPARTMENT_OTHER): Payer: PPO | Admitting: Hematology

## 2016-06-25 ENCOUNTER — Ambulatory Visit (HOSPITAL_BASED_OUTPATIENT_CLINIC_OR_DEPARTMENT_OTHER): Payer: PPO

## 2016-06-25 ENCOUNTER — Other Ambulatory Visit (HOSPITAL_BASED_OUTPATIENT_CLINIC_OR_DEPARTMENT_OTHER): Payer: PPO

## 2016-06-25 VITALS — BP 128/63 | HR 59 | Temp 98.5°F | Resp 18 | Ht 65.0 in | Wt 145.3 lb

## 2016-06-25 DIAGNOSIS — E46 Unspecified protein-calorie malnutrition: Secondary | ICD-10-CM

## 2016-06-25 DIAGNOSIS — D6481 Anemia due to antineoplastic chemotherapy: Secondary | ICD-10-CM

## 2016-06-25 DIAGNOSIS — C187 Malignant neoplasm of sigmoid colon: Secondary | ICD-10-CM

## 2016-06-25 DIAGNOSIS — C7972 Secondary malignant neoplasm of left adrenal gland: Secondary | ICD-10-CM

## 2016-06-25 DIAGNOSIS — Z452 Encounter for adjustment and management of vascular access device: Secondary | ICD-10-CM

## 2016-06-25 DIAGNOSIS — I5042 Chronic combined systolic (congestive) and diastolic (congestive) heart failure: Secondary | ICD-10-CM

## 2016-06-25 DIAGNOSIS — Z5112 Encounter for antineoplastic immunotherapy: Secondary | ICD-10-CM | POA: Diagnosis not present

## 2016-06-25 DIAGNOSIS — E119 Type 2 diabetes mellitus without complications: Secondary | ICD-10-CM

## 2016-06-25 DIAGNOSIS — R530 Neoplastic (malignant) related fatigue: Secondary | ICD-10-CM

## 2016-06-25 DIAGNOSIS — C7800 Secondary malignant neoplasm of unspecified lung: Secondary | ICD-10-CM

## 2016-06-25 DIAGNOSIS — Z5111 Encounter for antineoplastic chemotherapy: Secondary | ICD-10-CM | POA: Diagnosis not present

## 2016-06-25 DIAGNOSIS — C787 Secondary malignant neoplasm of liver and intrahepatic bile duct: Secondary | ICD-10-CM | POA: Diagnosis not present

## 2016-06-25 DIAGNOSIS — I10 Essential (primary) hypertension: Secondary | ICD-10-CM

## 2016-06-25 DIAGNOSIS — C772 Secondary and unspecified malignant neoplasm of intra-abdominal lymph nodes: Secondary | ICD-10-CM

## 2016-06-25 DIAGNOSIS — Z95828 Presence of other vascular implants and grafts: Secondary | ICD-10-CM

## 2016-06-25 LAB — COMPREHENSIVE METABOLIC PANEL
ALBUMIN: 3.1 g/dL — AB (ref 3.5–5.0)
ALK PHOS: 176 U/L — AB (ref 40–150)
ALT: 22 U/L (ref 0–55)
AST: 31 U/L (ref 5–34)
Anion Gap: 9 mEq/L (ref 3–11)
BILIRUBIN TOTAL: 0.65 mg/dL (ref 0.20–1.20)
BUN: 9.8 mg/dL (ref 7.0–26.0)
CO2: 23 meq/L (ref 22–29)
CREATININE: 0.8 mg/dL (ref 0.7–1.3)
Calcium: 8.4 mg/dL (ref 8.4–10.4)
Chloride: 111 mEq/L — ABNORMAL HIGH (ref 98–109)
GLUCOSE: 241 mg/dL — AB (ref 70–140)
Potassium: 4.1 mEq/L (ref 3.5–5.1)
SODIUM: 142 meq/L (ref 136–145)
TOTAL PROTEIN: 6.6 g/dL (ref 6.4–8.3)

## 2016-06-25 LAB — CBC WITH DIFFERENTIAL/PLATELET
BASO%: 0.3 % (ref 0.0–2.0)
BASOS ABS: 0 10*3/uL (ref 0.0–0.1)
EOS ABS: 0.1 10*3/uL (ref 0.0–0.5)
EOS%: 2.5 % (ref 0.0–7.0)
HEMATOCRIT: 25.8 % — AB (ref 38.4–49.9)
HEMOGLOBIN: 8.5 g/dL — AB (ref 13.0–17.1)
LYMPH#: 0.5 10*3/uL — AB (ref 0.9–3.3)
LYMPH%: 15.6 % (ref 14.0–49.0)
MCH: 28.2 pg (ref 27.2–33.4)
MCHC: 32.9 g/dL (ref 32.0–36.0)
MCV: 85.7 fL (ref 79.3–98.0)
MONO#: 0.3 10*3/uL (ref 0.1–0.9)
MONO%: 9.6 % (ref 0.0–14.0)
NEUT%: 72 % (ref 39.0–75.0)
NEUTROS ABS: 2.3 10*3/uL (ref 1.5–6.5)
Platelets: 109 10*3/uL — ABNORMAL LOW (ref 140–400)
RBC: 3.01 10*6/uL — ABNORMAL LOW (ref 4.20–5.82)
RDW: 18 % — AB (ref 11.0–14.6)
WBC: 3.1 10*3/uL — AB (ref 4.0–10.3)

## 2016-06-25 LAB — MAGNESIUM: Magnesium: 0.8 mg/dl — CL (ref 1.5–2.5)

## 2016-06-25 MED ORDER — PANITUMUMAB CHEMO INJECTION 100 MG/5ML
6.3000 mg/kg | Freq: Once | INTRAVENOUS | Status: AC
  Administered 2016-06-25: 400 mg via INTRAVENOUS
  Filled 2016-06-25: qty 20

## 2016-06-25 MED ORDER — SODIUM CHLORIDE 0.9 % IV SOLN
Freq: Once | INTRAVENOUS | Status: AC
  Administered 2016-06-25: 11:00:00 via INTRAVENOUS
  Filled 2016-06-25: qty 4

## 2016-06-25 MED ORDER — FLUOROURACIL CHEMO INJECTION 5 GM/100ML
1800.0000 mg/m2 | INTRAVENOUS | Status: DC
  Administered 2016-06-25: 3050 mg via INTRAVENOUS
  Filled 2016-06-25: qty 61

## 2016-06-25 MED ORDER — ATROPINE SULFATE 1 MG/ML IJ SOLN
INTRAMUSCULAR | Status: AC
  Filled 2016-06-25: qty 1

## 2016-06-25 MED ORDER — ATROPINE SULFATE 1 MG/ML IJ SOLN
0.5000 mg | Freq: Once | INTRAMUSCULAR | Status: AC | PRN
  Administered 2016-06-25: 0.5 mg via INTRAVENOUS

## 2016-06-25 MED ORDER — SODIUM CHLORIDE 0.9 % IV SOLN
140.0000 mg/m2 | Freq: Once | INTRAVENOUS | Status: AC
  Administered 2016-06-25: 240 mg via INTRAVENOUS
  Filled 2016-06-25: qty 10

## 2016-06-25 MED ORDER — SODIUM CHLORIDE 0.9 % IV SOLN
Freq: Once | INTRAVENOUS | Status: AC
  Administered 2016-06-25: 11:00:00 via INTRAVENOUS

## 2016-06-25 MED ORDER — SODIUM CHLORIDE 0.9 % IJ SOLN
10.0000 mL | INTRAMUSCULAR | Status: DC | PRN
  Administered 2016-06-25: 10 mL via INTRAVENOUS
  Filled 2016-06-25: qty 10

## 2016-06-25 MED ORDER — MAGNESIUM OXIDE 400 (241.3 MG) MG PO TABS: 400.0000 mg | ORAL_TABLET | Freq: Three times a day (TID) | ORAL | 3 refills | Status: DC

## 2016-06-25 NOTE — Patient Instructions (Signed)
Irinotecan injection ?y l thu?c g? IRINOTECAN l thu?c ha tr? li?u. N ???c dng ?? ?i?u tr? ung th? ru?t gi v ru?t th?ng (tr?c trng). Thu?c ny c th? ???c dng cho nh?ng m?c ?ch khc; hy h?i ng??i cung c?p d?ch v? y t? ho?c d??c s? c?a mnh, n?u qu v? c th?c m?c. Ti c?n ph?i bo cho ng??i cung c?p d?ch v? y t? c?a mnh ?i?u g tr??c khi dng thu?c ny? H? c?n bi?t li?u qu v? hi?n c b?t k? tnh tr?ng no sau ?y hay khng: -cc r?i lo?n v? mu -m?t n??c -tiu ch?y -nhi?m trng (??c bi?t l nhi?m virus, ch?ng h?n nh? th?y ??u, l? mi?ng, ho?c herpes) -b?nh gan -s? l??ng t? bo mu th?p, ch?ng h?n nh? s? l??ng b?ch c?u, ti?u c?u, ho?c h?ng c?u th?p -m?i x? tr? g?n ?y ho?c ?ang x? tr? -pha?n ??ng b?t th???ng ho??c di? ??ng v??i irinotecan, sorbitol, ho?c cc thu?c ha tr? li?u khc -pha?n ??ng b?t th???ng ho??c di? ??ng v??i ca?c d??c ph?m kha?c, th?c ph?m, thu?c nhu?m, ho??c ch?t ba?o qua?n -?ang c thai ho??c ??nh co? thai -?ang cho con bu? Ti nn s? d?ng thu?c ny nh? th? no? Thu?c ny ?? truy?n vo t?nh m?ch. Thu?c ny ???c cho trong b?nh vi?n ho?c phng m?ch b?i chuyn vin y t? ???c hu?n luy?n ??c bi?t. Hy bn v?i bc s? nhi khoa c?a qu v? v? vi?c dng thu?c ny ? tr? em. C th? c?n ch?m [place] ??c bi?t. Qu li?u: N?u qu v? cho r?ng mnh ? dng qu nhi?u thu?c ny, th hy lin l?c v?i trung tm ki?m sot ch?t ??c ho?c phng c?p c?u ngay l?p t?c. L?U : Thu?c ny ch? dnh ring cho qu v?. Khng chia s? thu?c ny v?i nh?ng ng??i khc. N?u ti l? qun m?t li?u th sao? ?i?u quan tr?ng l khng nn b? l? li?u thu?c no. Hy lin l?c v?i bc s? ho?c [place] vin y t? c?a mnh, n?u qu v? khng th? gi? ?ng cu?c h?n khm. Nh?ng g c th? t??ng tc v?i thu?c ny? Khng ???c dng thu?c ny cng v?i b?t k? th? no sau ?y: -atazanavir -ketoconazole -cy [facility] (c? [facility]/cy n?c s?i/cy l?nh) Thu?c ny c?ng c th? t??ng tc v?i cc thu?c sau  ?y: -dexamethasone -cc thu?c l?i ti?u -cc thu?c nhu?n trng -m?t s? thu?c dng ?? tr? cc ch?ng co gi?t, ch?ng h?n nh? carbamazepine, mephobarbital, phenobarbital, phenytoin, primidone -m?t s? thu?c lm t?ng s? l??ng t? bo mu, ch?ng h?n nh? filgrastim, pegfilgrastim, sargramostim -prochlorperazine -cc thu?c ch?ng ng?a [facility] sch ny c th? khng m t? ?? h?t cc t??ng tc c th? x?y ra. Hy ??a cho ng??i cung c?p d?ch v? y t? c?a mnh danh sch t?t c? cc thu?c, th?o d??c, cc thu?c khng c?n toa, ho?c cc ch? ph?m b? sung m qu v? dng. C?ng nn bo cho h? bi?t r?ng qu v? c ht thu?c, u?ng r??u, ho?c c s? d?ng ma ty tri php hay khng. Vi th? c th? t??ng tc v?i thu?c c?a qu v?. Ti c?n ph?i theo di ?i?u g trong khi dng thu?c ny? Qu v? s? ???c theo di ch?t ch? trong khi dng thu?c ny. Qu v? s? c?n ph?i ?i lm cc xt nghi?m mu quan tr?ng trong th?i gian dng thu?c ny. Thu?c ny c th? lm cho qu v? c?m th?y khng ???c kh?e nh? th??ng l?. ?i?u ny khng ph?i khng  ph? bi?n, b?i v thu?c ha tr? li?u c th? ?nh h??ng ??n c? t? bo lnh l?n t? bo ung th?. Hy t??ng trnh m?i tc d?ng ph?. Hy ti?p t?c ??t ?i?u tr? c?a mnh ngay c? khi qu v? c?m th?y m?t, tr? khi bc s? yu c?u qu v? ng?ng ?i?u tr?[place] m?t s? tr??ng h?p, qu v? c th? ???c cho dng cc thu?c ph? thm ?? gip gi?m tc d?ng ph?. Hy lm theo t?t c? cc h??ng d?n v? vi?c s? d?ng chng. Qu v? c th? b? bu?n ng? ho?c chng m?t. Khng ???c li xe, s? d?ng my mc, ho?c lm nh?ng vi?c c?n ph?i t?nh to cho t?i khi qu v? bi?t ???c thu?c ny ?nh h??ng ln qu v? nh? th? no. Khng ???c ng?i d?y ho?c ??ng d?y nhanh, ??c bi?t l khi qu v? l b?nh nhn l?n tu?i. ?i?u ny lm gi?m nguy c? b? chng m?t ho?c ng?t x?u. Hy h?i  ki?n bc s? ho?c chuyn vin y t?, n?u qu v? b? s?t, ?n l?nh ho?c ?au h?ng, ho?c c cc tri?u ch?ng khc c?a c?m l?nh ho?c cm. Khng ???c t? ?i?u tr? cho mnh. Thu?c ny lm gi?m kh? n?ng ch?ng  l?i cc b?nh nhi?m trng c?a c? th?. Hy c? trnh ? g?n nh?ng ng??i b? b?nh. Thu?c ny c th? lm t?ng nguy c? b? b?m tm ho?c ch?y mu. Hy lin l?c v?i bc s? ho?c chuyn vin y t?, n?u qu v? th?y ch?y mu b?t th??ng. Hy c?n th?n khi ?nh r?ng ho?c x?a r?ng b?ng ch? nha khoa ho?c b?ng t?m, b?i v qu v? c th? d? b? nhi?m trng ho?c d? b? ch?y mu h?n. N?u qu v? c ?i lm r?ng, th hy bo v?i nha s? r?ng qu v? ?ang dng thu?c ny. Trnh dng cc thu?c c ch?a aspirin, acetaminophen, ibuprofen, naproxen, ho?c ketoprofen, tr? khi ? ???c bc s? ch? d?n. Cc thu?c ny c th? che l?p tri?u ch?ng s?t. Khng ???c ?? c thai trong khi dng thu?c ny. Ph? n? c?n ph?i thng bo cho bc s? c?a mnh, n?u mu?n c thai ho?c ngh? r?ng c th? mnh ? c [place]. C nguy c? v? cc tc d?ng ph? nghim tr?ng ??i v?i [place] nhi. Hy th?o lu?n v?i bc s? ho?c chuyn vin y t? ho?c d??c s? ?? bi?t thm thng tin. Khng ???c nui con b?ng s?a m? trong khi dng thu?c ny. Ti c th? nh?n th?y nh?ng tc d?ng ph? no khi dng thu?c ny? Nh?ng tc d?ng ph? qu v? c?n ph?i bo cho bc s? ho?c chuyn vin y t? cng s?m cng t?t: -cc ph?n ?ng d? ?ng, ch?ng h?n nh? da b? m?n ??, ng?a, n?i my ?ay, s?ng ? m?t, mi, ho?c l??i -gi?m s? l??ng t? bo mu - thu?c ny c th? lm gi?m s? l??ng t? bo b?ch c?u, t? bo h?ng c?u v ti?u c?u. Qu v? c th? c nguy c? cao b? nhi?m trng v xu?t huy?t. -cc d?u hi?u nhi?m trng - s?t ho?c ?n l?nh, ho, ?au h?ng, kh ?i ti?u ho?c ?i ti?u ?au -cc d?u hi?u gi?m ti?u c?u ho?c xu?t huy?t - b?m tm, cc n?t l?m t?m ?? trn da, phn c mu ?en, mu h?c n, c mu trong n??c ti?u -cc d?u hi?u gi?m s? l??ng t? bo h?ng c?u - c?m th?y y?u ?t ho?c m?t m?i m?t cch b?t th??ng, cc c?n ng?t x?u, chong vng -cc  v?n ?? v? h h?p -?au ng?c -tiu ch?y -c?m th?y chong vng, ng?t x?u, b? t -?? b?ng da, s? m?i, v m? hi trong lc truy?n thu?c -?au ho?c l? mi?ng -?au, s?ng, ?? ho?c kch ?ng ? ch?  tim -?au, s?ng ho?c c?m th?y nng ? chn -?au, t ho?c c?m gic nh? b? ki?n b ? bn tay ho?c bn chn -cc v?n ?? v? gi? th?ng b?ng, ni n?ng, ?i l?i -?au b?ng ho?c qu?n b?ng -kh ?i ti?u ho?c thay ??i l??ng n??c ti?u ???c bi ti?t -i m?a v khng th? ch?u ???c ?? u?ng v th?c ?n -vng da ho?c m?t Cc tc d?ng ph? khng c?n ph?i ch?m [place] y t? (hy bo cho bc s? ho?c chuyn vin y t?, n?u cc tc d?ng ph? ny ti?p di?n ho?c gy phi?n toi): -to bn -r?ng tc -?au ??u -m?t c?m gic ngon mi?ng -bu?n i ho?c i m?a -kh ch?u ? bao t? Dan[facility] sch ny c th? khng m t? ?? h?t cc tc d?ng ph? c th? x?y ra. Xin g?i t?i bc s? c?a mnh ?? ???c c? v?n chuyn mn v? cc tc d?ng ph?Sander Nephew v? c th? t??ng trnh cc tc d?ng ph? cho FDA theo s? [phone]. Ti nn c?t gi? thu?c c?a mnh ? ?u? Thu?c ny ???c s? d?ng b?i chuyn vin y t? ? b?nh vi?n ho?c ? phng m?ch. Qu v? s? khng ???c c?p thu?c ny ?? c?t gi? t?i nh. L?U : ?y l b?n tm t?t. N c th? khng bao hm t?t c? thng tin c th? c. N?u qu v? th?c m?c v? thu?c ny, xin trao ??i v?i bc s?, d??c s?, ho?c ng??i cung c?p d?ch v? y t? c?a mnh.    2016, Elsevier/Gold Standard. (2009-09-15 00:00:00)  Panitumumab Solution for Injection ?y l thu?c g? PANITUMUMAB l m?t khng th? ??n dng. Thu?c ???c dng ?? ?i?u tr? ung th? ru?t gi ru?t th?ng. Thu?c ny c th? ???c dng cho nh?ng m?c ?ch khc; hy h?i ng??i cung c?p d?ch v? y t? ho?c d??c s? c?a mnh, n?u qu v? c th?c m?c. Ti c?n ph?i bo cho ng??i cung c?p d?ch v? y t? c?a mnh ?i?u g tr??c khi dng thu?c ny? H? c?n bi?t li?u qu v? c b?t k? tnh tr?ng no sau ?y khng: -b?nh ph?i, ??c bi?t l x? nang ph?i -cc tnh tr?ng b?nh l ho?c m?n c?m c?a da -pha?n ??ng b?t th???ng ho??c di? ??ng v??i panitumumab ho?c protein c?a chu?t -pha?n ??ng b?t th???ng ho??c di? ??ng v??i ca?c d??c ph?m kha?c, th?c ph?m, thu?c nhu?m, ho??c ch?t ba?o qua?n -?ang c thai ho??c ??nh  co? thai -?ang cho con bu? Ti nn s? d?ng thu?c ny nh? th? no? Thu?c ny ?? truy?n vo t?nh m?ch. Thu?c ny ???c cho trong b?nh vi?n ho?c phng m?ch b?i chuyn vin y t? ???c hu?n luy?n ??c bi?t. Hy bn v?i bc s? nhi khoa c?a qu v? v? vi?c dng thu?c ny ? tr? em. C th? c?n ch?m [place] ??c bi?t. Qu li?u: N?u qu v? cho r?ng mnh ? dng qu nhi?u thu?c ny, th hy lin l?c v?i trung tm ki?m sot ch?t ??c ho?c phng c?p c?u ngay l?p t?c. L?U : Thu?c ny ch? dnh ring cho qu v?. Khng chia s? thu?c ny v?i nh?ng ng??i khc. N?u ti l? qun m?t li?u th sao? ?i?u quan tr?ng l khng nn b? l? li?u thu?c no. Hy lin l?c  v?i bc s? ho?c [place] vin y t? c?a mnh, n?u qu v? khng th? gi? ?ng cu?c h?n khm. Nh?ng g c th? t??ng tc v?i thu?c ny? -m?t s? thu?c dng ?? tr? ung th? Danh sch ny c th? khng m t? ?? h?t cc t??ng tc c th? x?y ra. Hy ??a cho ng??i cung c?p d?ch v? y t? c?a mnh danh sch t?t c? cc thu?c, th?o d??c, cc thu?c khng c?n toa, ho?c cc ch? ph?m b? sung m qu v? dng. C?ng nn bo cho h? bi?t r?ng qu v? c ht thu?c, u?ng r??u, ho?c c s? d?ng ma ty tri php hay khng. Vi th? c th? t??ng tc v?i thu?c c?a qu v?. Ti c?n ph?i theo di ?i?u g trong khi dng thu?c ny? Hy ?i g?p bc s? ho?c [place] vin y t? ?? theo di ??nh k? s? c?i thi?n c?a qu v?. Thu?c ny c th? lm cho qu v? c?m th?y khng ???c kh?e nh? th??ng l?. ?i?u ny khng ph?i khng ph? bi?n, b?i v thu?c ha tr? li?u c th? ?nh h??ng ??n c? t? bo lnh l?n t? bo ung th?. Hy t??ng trnh m?i tc d?ng ph?. Hy ti?p t?c ??t ?i?u tr? c?a mnh ngay c? khi qu v? c?m th?y m?t, tr? khi bc s? yu c?u qu v? ng?ng ?i?u tr?. Thu?c ny c th? lm cho qu v? b? nh?y c?m h?n v?i nh n?ng. Hy trnh ra n?ng trong th?i gian ???c tim thu?c ny v trong 2 thng sau li?u cu?i cng. N?u qu v? khng th? trnh ra n?ng, th hy m?c trang ph?c b?o v? v bi thu?c ch?ng n?ng. Khng ???c dng cc ?n chi?u nh  n?ng (sun lamps) ho?c gi??ng ho?c bu?ng dng ?? t?o ln da rm n?ng (sun tanning beds or booths). Trong m?t s? tr??ng h?p, qu v? c th? ???c cho dng cc thu?c ph? thm ?? gip gi?m tc d?ng ph?. Hy lm theo t?t c? cc h??ng d?n v? vi?c s? d?ng chng. Hy h?i  ki?n bc s? ho?c chuyn vin y t?, n?u qu v? b? s?t, ?n l?nh ho?c ?au h?ng, ho?c c cc tri?u ch?ng khc c?a c?m l?nh ho?c cm. Khng ???c t? ?i?u tr? cho mnh. Thu?c ny c th? lm gi?m kh? n?ng ch?ng l?i cc b?nh nhi?m trng c?a c? th?. Hy c? trnh ? g?n nh?ng ng??i b? b?nh. Trnh dng cc thu?c c ch?a aspirin, acetaminophen, ibuprofen, naproxen, ho?c ketoprofen, tr? khi ? ???c bc s? ch? d?n. Cc thu?c ny c th? che l?p tri?u ch?ng s?t. Khng ???c c thai trong th?i gian u?ng thu?c ny v trong vng 6 thng sau li?u thu?c cu?i cng. Ph? n? c?n ph?i thng bo cho bc s? c?a mnh, n?u mu?n c thai ho?c ngh? r?ng c th? mnh ? c [place]. Nam gi??i khng nn gy thu? thai trong khi du?ng thu?c na?y va? trong vng 6 tha?ng sau li?u thu?c cu?i cng. C nguy c? v? cc tc d?ng ph? nghim tr?ng ??i v?i [place] nhi. Hy th?o lu?n v?i bc s? ho?c chuyn vin y t? ho?c d??c s? ?? bi?t thm thng tin. Khng ???c nui con b?ng s?a m? trong khi dng thu?c ny. Ti c th? nh?n th?y nh?ng tc d?ng ph? no khi dng thu?c ny? Nh?ng tc d?ng ph? qu v? c?n ph?i bo cho bc s? ho?c chuyn vin y t? cng s?m cng t?t: -cc ph?n ?ng d? ?ng, ch?ng h?n nh? da  b? m?n ??, ng?a, n?i my ?ay, s?ng ? m?t, mi, ho?c l??i -kh th? -thay ??i th? l?c -tim ??p nhanh ho?c khng ??u -c?m th?y chong vng, ng?t x?u, b? t -s?t ho?c ?n l?nh -lot mi?ng -s?ng ? m?t c chn, bn chn, ho?c bn tay -m?t m?i ho?c y?u ?t b?t th??ng Cc tc d?ng ph? khng c?n ph?i ch?m [place] y t? (hy bo cho bc s? ho?c chuyn vin y t?, n?u cc tc d?ng ph? ny ti?p di?n ho?c gy phi?n toi): -nh?ng thay ??i trn da, ch?ng h?n nh? m?n tr?ng c, da b? n?t n?, ho?c da b? kh -to bn -tiu  ch?y -pht tri?n lng mi -?au ??u -cc thay ??i c?a mng -bu?n i ho?c i m?a -kh ch?u ? bao t? Danh sch ny c th? khng m t? ?? h?t cc tc d?ng ph? c th? x?y ra. Xin g?i t?i bc s? c?a mnh ?? ???c c? v?n chuyn mn v? cc tc d?ng ph?Sander Nephew v? c th? t??ng trnh cc tc d?ng ph? cho FDA theo s? [phone]. Ti nn c?t gi? thu?c c?a mnh ? ?u? Thu?c ny ???c s? d?ng b?i chuyn vin y t? ? b?nh vi?n ho?c ? phng m?ch. Qu v? s? khng ???c c?p thu?c ny ?? c?t gi? t?i nh. L?U : ?y l b?n tm t?t. N c th? khng bao hm t?t c? thng tin c th? c. N?u qu v? th?c m?c v? thu?c ny, xin trao ??i v?i bc s?, d??c s?, ho?c ng??i cung c?p d?ch v? y t? c?a mnh.    2016, Elsevier/Gold Standard. (2015-06-06 00:00:00)  [facility] Discharge Instructions for Patients Receiving Chemotherapy  Today you received the following chemotherapy agents Irinotecan, Vectibix, and 5FU.  To help prevent nausea and vomiting after your treatment, we encourage you to take your nausea medication as directed by your MD.   If you develop nausea and vomiting that is not controlled by your nausea medication, call the clinic.   BELOW ARE SYMPTOMS THAT SHOULD BE REPORTED IMMEDIATELY:  *FEVER GREATER THAN 100.5 F  *CHILLS WITH OR WITHOUT FEVER  NAUSEA AND VOMITING THAT IS NOT CONTROLLED WITH YOUR NAUSEA MEDICATION  *UNUSUAL SHORTNESS OF BREATH  *UNUSUAL BRUISING OR BLEEDING  TENDERNESS IN MOUTH AND THROAT WITH OR WITHOUT PRESENCE OF ULCERS  *URINARY PROBLEMS  *BOWEL PROBLEMS  UNUSUAL RASH Items with * indicate a potential emergency and should be followed up as soon as possible.  Feel free to call the clinic you have any questions or concerns. The clinic phone number is [phone].  Please show the [facility] at check-in to the Emergency Department and triage nurse.

## 2016-06-25 NOTE — Progress Notes (Signed)
[place]  [phone] [phone]  Clinic follow up Note   Patient Care Team: Jason Junior, MD as PCP - General (Specialist) Jason Reilly, CRNP as Nurse Practitioner (Nurse Practitioner) Jason Merle, MD as Consulting Physician (Hematology) Jason Artist, MD as Consulting Physician (Gastroenterology) 06/25/2016  CHIEF COMPLAINTS Follow up metastatic sigmoid colon cancer  Oncology History   He is a Metastatic colon cancer to liver   Staging form: Colon and Rectum, AJCC 7th Edition     Clinical: Stage Unknown ([place], NX, M1) - Unsigned      Cancer of sigmoid colon ([place])   10/28/2014 Tumor Marker    AFP 3.2 CEA > 10,000 CA 19-9 12,929.6. tumor (-) KRAS and NRAS mutation.       11/10/2014 Imaging    PET scan showed hypermetabolic mass in the sigmoid colon was noted metastasis in the retroperitoneum. Probable left adrenal and pulmonary metastasis, and diffuse liver metastasis.      11/21/2014 Initial Diagnosis    Metastatic colon cancer to liver, lung, abd nodes and left adrenal gland. Diagnosis was made by liver biopsy.       11/30/2014 - 05/23/2015 Chemotherapy    First line chemo [id number], Panitumumab added from second cycle, chemo held due to recurrent GI bleeding        12/28/2014 - 12/31/2014 Hospital Admission    Was admitted for dehydration, neutropenia fever with UTI, and severe skin rash.      02/08/2015 - 02/12/2015 Hospital Admission    He was admitted for upper GI bleeding, e.g. showed a gastric ulcer with clots, status post appendectomy injection. He also received a blood transfusion.      03/01/2015 Tumor Marker    CEA 694, CA19.9 462      03/13/2015 Imaging    CT CAP showed partial response, no new lesions.       05/31/2015 - 06/02/2015 Hospital Admission    He was admitted to [facility] in [place] due to upper GI bleeding, EGD showed gastric and dudenal ulcers       06/27/2015 - 06/29/2015 Hospital Admission    he was admitted  for dairrhea and pancolitis, c-diff and stool cultures were negative, treated with antibiotics      07/12/2015 -  Chemotherapy    panitumumab 16m/kg, every 2 weeks      09/19/2015 -  Chemotherapy    Irinotecan 1872mm2 every 2 weeks, dose reduction due to diarrhea,       11/08/2015 - 11/10/2015 Hospital Admission    Pt was admitted for sepsis and hypotension, was found to have (+) influenza A and treated.       11/30/2015 Imaging     CT scans reviewed continued improvement in the liver metastasis, no other new lesions.      05/06/2016 Tumor Marker    CEA has gradually increased from 110 in March 2017 to 566 in July 2070      06/04/2016 Imaging    Restaging CT scan showed similar to mild increase in hepatic metastasis, mild enlargement of right adrenal nodule, primary similar pulmonary nodules. A left upper lobe nodule has resolved.        HISTORY OF PRESENTING ILLNESS:  Jason Reilly 89.o. male is here because of abnormal CT findings, which is very suspicious for malignancy. [place] He came in with his son and an interpreter.  He has been feeling fatigued  since two month ago. He is still able to do all ADLs. He otherwise denies any pain, bloating or nausea.  He lost about 20lbs in 3 month. His appetite is lower than before, eats less, no change of his bowl habits.  She denied any hematochezia or melana. Per his son, he has had some personality changes daily, irritable, slightly confused some time.  He was evaluated by his primary care physician. Lab test reviewed hepatitis B infection, which he did not know before, and elevated alkaline phosphatase, his liver function and the rest of the liver function was not remarkable. [place] of abdomen was obtained on 07/22/2014, which showed diffusely abnormal liver with multiple echogenic lesions. CT of abdomen with and without contrast was done on 08/26/2014, which reviewed here at a medically  with multiple large partially calcified hepatic masses consistent with metastatic disease. Mild retroperitoneal adenopathy with the largest node measuring 1.6 cm. And nonspecific 1.4 cm left adrenal nodule was also noticed. His tumor marker showed CEA greater than 10,000, CA 19-9 12,929, AFP 3.2 (normal). He was referred to [facility] and was evaluated by nurse practitioner Jason Reilly. Treatment for hepatitis B was not recommended based on his virus load.  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 He was evaluated by a cardiologist in 2014. He denies any significant dyspnea on exertion. No leg swollen.  CURRENT THERAPY:  1. panitumumab 13m/kg, every 2 weeks, started on 07/12/2015 2. Irinotecan 1869mm2 every 2 weeks added on 09/19/2015, dose decreased to 16061m2 from cycle 2, and further decreased to 125m61m from cycle 10 due to diarrhea and anorexia, changed to 140mg42mfrom cycle 13. 3. 5-FU 1800mg/58mver 46 hours added back from 06/10/2016 due to mild disease progression 4. He also receives IV magnesium 6g weekly   INTERIM HISTORY: Jason rAnasns for follow-up and chemotherapy. He presented to the clinic by himself today. He tolerated FOLFIRI and Avastin well 2 weeks ago, no significant nausea, diarrhea, or other side effects. His appetite and energy level has remained to be decent, he functions well at home. No fever or chills, his weight has been stable, no other new complaints.  MEDICAL HISTORY:  Past Medical History:  Diagnosis Date  . Abnormal EKG   . Diabetes mellitus without complication (HCC)    metformin  . Gastric ulcer   . H. pylori infection   . Hepatitis B   . Hypertension   . Metastatic colon cancer to liver (HCC)  [place]-ischemic cardiomyopathy (HCC)  [place]     SURGICAL HISTORY: Past Surgical History:  Procedure Laterality Date  . ESOPHAGOGASTRODUODENOSCOPY N/A 02/08/2015   Procedure:  ESOPHAGOGASTRODUODENOSCOPY (EGD);  Surgeon: MalcolLadene Reilly [facility];  Service: Endoscopy;  Laterality: N/A;  . ESOPHAGOGASTRODUODENOSCOPY (EGD) WITH PROPOFOL N/A 08/15/2015   Procedure: ESOPHAGOGASTRODUODENOSCOPY (EGD) WITH PROPOFOL;  Surgeon: MalcolLadene Reilly Location: [facility];  Service: Endoscopy;  Laterality: N/A;  . LEFT AND RIGHT HEART CATHETERIZATION WITH CORONARY ANGIOGRAM N/A 03/29/2013   Procedure: LEFT AND RIGHT HEART CATHETERIZATION WITH CORONARY ANGIOGRAM;  Surgeon: [place]ie [place] [facility];  Service: Cardiovascular;  Laterality: N/A;  . Nuclear Stress Test  03/03/2013   High risk - consistent with nonischemic cardiomyopathy    SOCIAL HISTORY: Social History   Social History  . Marital status: Married    Spouse name: N/A  . Number of children: N/A  . Years of education: N/A  Occupational History  . Not on file.   Social History Main Topics  . Smoking status: Former Smoker    Types: Cigarettes    Quit date: 11/07/2008  . Smokeless tobacco: Never Used  . Alcohol use Yes     Comment: occasional  . Drug use: No  . Sexual activity: No   Other Topics Concern  . Not on file   Social History Narrative   Married   Enjoys walking   Has lived in [place] > 16 years    FAMILY HISTORY: No family history of liver disease or malignancy.  ALLERGIES:  has No Known Allergies.  MEDICATIONS:  Current Outpatient Prescriptions on File Prior to Visit  Medication Sig Dispense Refill  . clindamycin (CLINDAGEL) 1 % gel Apply topically 2 (two) times daily. 30 g 2  . diphenoxylate-atropine (LOMOTIL) 2.5-0.025 MG tablet Take 1-2 tablets by mouth 4 (four) times daily as needed for diarrhea or loose stools. 30 tablet 0  . folic acid (FOLVITE) 1 MG tablet Take 1 tablet (1 mg total) by mouth daily. 30 tablet 3  . hydrocortisone 1 % ointment Apply 1 application topically 2 (two) times daily. 56 g 0  . lidocaine-prilocaine (EMLA) cream Apply 1  application topically as needed. Apply to [facility] cath at least one hour before needle stick. 30 g 2  . lisinopril-hydrochlorothiazide (PRINZIDE,ZESTORETIC) 10-12.5 MG tablet     . loperamide (IMODIUM) 2 MG capsule Take 2 capsules (4 mg total) by mouth 4 (four) times daily as needed for diarrhea or loose stools (NO MORE THAN 8 TABLETS PER DAY.). 60 capsule 3  . magic mouthwash SOLN Take 5 mLs by mouth 4 (four) times daily. 120 mL 1  . metFORMIN (GLUCOPHAGE) 500 MG tablet Take 1 tablet (500 mg total) by mouth 2 (two) times daily with a meal. 60 tablet 0  . nadolol (CORGARD) 20 MG tablet Take 1 tablet (20 mg total) by mouth 2 (two) times daily. THIS IS A ONE TIME ORDER.  FUTURE REFILLS NEED TO BE DONE BY PRIMARY MD. 60 tablet 1  . ondansetron (ZOFRAN) 8 MG tablet Take 1 tablet (8 mg total) by mouth every 8 (eight) hours as needed for nausea or vomiting. 45 tablet 2  . pantoprazole (PROTONIX) 40 MG tablet Take 1 tablet (40 mg total) by mouth daily. 30 tablet 2  . prochlorperazine (COMPAZINE) 10 MG tablet Take 1 tablet (10 mg total) by mouth every 6 (six) hours as needed for nausea or vomiting. 30 tablet 2   Current Facility-Administered Medications on File Prior to Visit  Medication Dose Route Frequency Provider Last Rate Last Dose  . 0.9 %  sodium chloride infusion   Intravenous Once Jason Merle, MD      . ferumoxytol [facility]) 510 mg in sodium chloride 0.9 % 100 mL IVPB  510 mg Intravenous Once Jason Merle, MD      . sodium chloride 0.9 % injection 10 mL  10 mL Intracatheter PRN Jason Merle, MD   10 mL at 10/03/15 1705  . sodium chloride 0.9 % injection 10 mL  10 mL Intravenous PRN Jason Merle, MD   10 mL at 05/14/16 1127  ;   REVIEW OF SYSTEMS:   Constitutional: Denies fevers, chills or abnormal night sweats, (+) fatigue  Eyes: Denies blurriness of vision, double vision or watery eyes Ears, nose, mouth, throat, and face: Denies mucositis or sore throat Respiratory: Denies cough, dyspnea or  wheezes Cardiovascular: Denies palpitation, chest discomfort or lower extremity swelling  Gastrointestinal: Denies nausea, heartburn or change in bowel habits Skin: Denies abnormal skin rashes Lymphatics: Denies new lymphadenopathy or easy bruising Neurological:Denies numbness, tingling or new weaknesses Behavioral/Psych: Mood is stable, no new changes, (+) insomnia All other systems were reviewed with the patient and are negative.  PHYSICAL EXAMINATION: BP 128/63 (BP Location: Left Arm, Patient Position: Sitting)   Pulse (!) 59   Temp 98.5 F (36.9 C) (Oral)   Resp 18   Ht _0  (1.651 m)   Wt 145 lb 4.8 oz (65.9 kg)   SpO2 100%   BMI 24.18 kg/m   ECOG PERFORMANCE STATUS: 1 Vital sign were taken at in the infusion room, within normal limits. GENERAL:alert, no distress and comfortable SKIN: (+) Dry skin,  no skin ulcer, (+) scatter skin rashes on his neck and upper chest, some are resolving with skin pigmentation. No discharge. EYES: normal, conjunctiva are pink and non-injected, sclera clear OROPHARYNX:no exudate, no erythema and lips, buccal mucosa, and tongue with mild discoloration at the tip.  NECK: supple, thyroid normal size, non-tender, without nodularity LYMPH:  no palpable lymphadenopathy in the cervical, axillary or inguinal LUNGS: clear to auscultation and percussion with normal breathing effort, no rales   HEART: regular rate & rhythm and no murmurs and no lower extremity edema ABDOMEN:abdomen soft, non-tender, no hepatomegaly, no splenomegaly and normal bowel sounds Musculoskeletal:no cyanosis of digits and no clubbing  PSYCH: alert & oriented x 3 with fluent speech NEURO: no focal motor/sensory deficits  LABORATORY DATA:  I have reviewed the data as listed CBC Latest Ref Rng & Units 06/25/2016 06/10/2016 05/27/2016  WBC 4.0 - 10.3 10e3/uL 3.1(L) 2.9(L) 3.9(L)  Hemoglobin 13.0 - 17.1 g/dL 8.5(L) 8.1(L) 9.3(L)  Hematocrit 38.4 - 49.9 % 25.8(L) 25.3(L) 28.3(L)   Platelets 140 - 400 10e3/uL 109(L) 130(L) 168    CMP Latest Ref Rng & Units 06/25/2016 06/10/2016 05/27/2016  Glucose 70 - 140 mg/dl 241(H) 125 79  BUN 7.0 - 26.0 mg/dL 9.8 7.3 8.1  Creatinine 0.7 - 1.3 mg/dL 0.8 0.7 0.8  Sodium 136 - 145 mEq/L 142 139 142  Potassium 3.5 - 5.1 mEq/L 4.1 4.2 4.2  Chloride 101 - 111 mmol/L - - -  CO2 22 - 29 mEq/L _1 Calcium 8.4 - 10.4 mg/dL 8.4 7.7(L) 8.9  Total Protein 6.4 - 8.3 g/dL 6.6 6.5 7.4  Total Bilirubin 0.20 - 1.20 mg/dL 0.65 0.47 0.46  Alkaline Phos 40 - 150 U/L 176(H) 161(H) 194(H)  AST 5 - 34 U/L 31 30 32  ALT 0 - 55 U/L _2 INITIAL tumor markers AFP 3.2 CEA > 10,000 CA 19-9 12,929.6  Results for RISHAAN, GUNNER (MRN [id number]) as of 06/25/2016 20:38  Ref. Range 02/19/2016 08:24 03/12/2016 13:11 04/09/2016 10:22 05/06/2016 13:23 06/10/2016 10:15  CEA Latest Units: ng/mL 134.6 (H)      CEA Latest Ref Range: 0.0 - 4.7 ng/mL 155.8 (H) 221.4 (H) 289.7 (H) 566.1 (H) 639.7 (H)  CEA ([facility]) Latest Ref Range: 0.00 - 5.00 ng/mL     780.47 (H)    Pathology report  Liver, needle/core biopsy - METASTATIC ADENOCARCINOMA, SEE COMMENT. Microscopic Comment The adenocarcinoma demonstrates the following immunophenotype: Cytokeratin 7 - negative expression. Cytokeratin 20 - strong diffuse expression. CD2 - strong diffuse expression. Overall the morphology and immunophenotype are that of metastatic adenocarcinoma primary to colorectum. The recent nuclear medicine scan demonstrating sigmoid mass with associated liver masses is noted.  [facility]One test result:  RADIOGRAPHIC STUDIES: I have personally reviewed the outside CT scan image with patient and his son.   CT chest, abdomen and pelvis with IV contrast oN  06/04/2016 IMPRESSION: 1. Similar to mild increase in hepatic metastasis. 2. Primarily similar pulmonary nodules. A left upper lobe nodule has resolved. 3. Mild enlargement of a right adrenal nodule. 4. No new sites of  metastatic disease identified. 5.  Coronary artery atherosclerosis. Aortic atherosclerosis. 6. 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 Recannulized paraumbilical vein is consistent with chronic portal venous hypertension.   ASSESSMENT & PLAN:  70 year old [place] male, with past history of hypertension and dilated nonischemic gammopathy with EF 25%, no clinical signs of heart failure, who was found to have hepatitis B infection lately, and multiple liver lesions on the CT scan. He has extremely high CEA and CA 19-9 levels. PET scan reviewed a hypermetabolic sigmoid colon mass, diffuse liver metastasis, probable lung and adrenal gland metastasis.  1. Metastatic sigmoid colon cancer, with diffuse liver, lungs, node and left adrenal gland metastases. KRAS/NRAS wild type, MSI-stable -Liver biopsy showed metastatic adenocarcinoma. His tumor were strongly positive for CK20 and CD2, consistent with primary colorectal primary. KRAS and NRAS mutations were not detected.  -Pt understands that this is incurable cancer, and he has very high disease burden and overall prognosis is poor. The treatment goal is palliative -I discussed his restaging CT from 06/04/2016 which showed overall stable disease, mild increase in hepatic metastasis. His tumor marker CEA has been trending up, corresponding to mild disease progression. -I have added 5-FU back, and continue irinotecan and panitumumab on 06/11/16, he tolerated well. -He is clinical doing well, lab reviewed, adequate for treatment, we'll proceed chemo today   2. Grade 1-2 skin rashes, stable  -Secondary to panitumumab, slightly worse today -continue hydrocortisone 2.5%, and clindamycin gel 1%  twice daily as needed  -He knows to avoid sun exposure, and call me if it gets worse.  3. Gastric ulcer with significant GI bleeding in April and Aug 2016 -He is on PPI, continue once daily  -Repeat his EGD on 08/15/2015 showed near  complete healing of his gastric ulcer -continue Nadolol 34m bid, per Dr. SFuller Plan- I encourage him to follow-up with Dr. [facility]uller [facility] since he will resume 5-FU  4. Type 2 DM  -HbA1c was 8.5, blood glucose not well controlled overall  -I encourage him to follow-up with his primary care physician Dr. WJimmye [place]  5. HTN, Dilated nonischemic ischemia cardiomyopathy with EF 25% -He is clinically doing well without symptoms of CHF. However this is probably going to impact his chemotherapy.Will try to avoid cardiotoxic chemotherapy agent and avoid fluid overload during chemotherapy. -Continue follow-up with cardiology. -will hold on his lisinopril-HCTZ for now, his BP has been normal lately   6 Hepatitis B carrier, with mild portal hypertension  -Per liver clinic, no need for treatment. Follow-up with Dr. SSilvio Pate  7. Malnutrition -I encouraged him to eat more, and take supplements as needed. -improved, gained weight back after I reduce his chemotherapy dose  -follow up with Dietitian   8. Anemia secondary to GI bleeding, iron deficiency and chemo  -Repeat lab on 06/06/2015 showed ferritin 117, serum iron 24, saturation 8%, which supports iron deficiency -He received IV Feraheme again in Aug 2016 after GI bleeding  -Repeat a ferritin was 273 on 10/03/2015, much improved  -he received iv feraheme again on 11/27/2015 and 12/04/2015, but anemia did not improve much. -His anemia  has been getting worse lately, probably related to chemotherapy, we'll close monitor any signs of GI bleeding. -Blood transfusion if hemoglobin less than 8 or symptomatic anemia with hemoglobin 8-9 -Hemoglobin 8.5 today, not symptomatic, will continue monitoring  11. hypomagnesemia  -secondary to panitumumab -he receives IV mag 6 g every week -He is taking magnesium pill 2 tablets 3 times a day -follow up weekly    Plan -Lab results reviewed, adequate for treatment, we'll proceed FOLFIRI and  panitumumab today and continue every 2 weeks.  -magnesium infusion weekly on Wednesday  -I'll see him back in 2 weeks   All questions were answered. The patient knows to call the clinic with any problems, questions or concerns.  I spent 25 minutes counseling the patient face to face. The total time spent in the appointment was 30 minutes and more than 50% was on counseling.     Jason Merle, MD 06/25/16

## 2016-06-25 NOTE — Progress Notes (Signed)
Pt scheduled to return tomorrow 06/26/16 for magnesium. Use of port while infusing 5FU would postpone stop until 1630 on Thursday 06/27/16. Ask pt if possible to initiate peripheral IV in order to not postpone pump d/c time. Pharmacy was made aware of conflict. Okay to start PIV per pharmacy.

## 2016-06-26 ENCOUNTER — Other Ambulatory Visit: Payer: Self-pay | Admitting: Nurse Practitioner

## 2016-06-26 ENCOUNTER — Ambulatory Visit (HOSPITAL_BASED_OUTPATIENT_CLINIC_OR_DEPARTMENT_OTHER): Payer: PPO

## 2016-06-26 VITALS — BP 128/56 | HR 63 | Temp 98.0°F | Resp 16

## 2016-06-26 DIAGNOSIS — D509 Iron deficiency anemia, unspecified: Secondary | ICD-10-CM

## 2016-06-26 DIAGNOSIS — C187 Malignant neoplasm of sigmoid colon: Secondary | ICD-10-CM

## 2016-06-26 MED ORDER — SODIUM CHLORIDE 0.9 % IV SOLN
Freq: Once | INTRAVENOUS | Status: AC
  Administered 2016-06-26: 13:00:00 via INTRAVENOUS
  Filled 2016-06-26: qty 250

## 2016-06-26 MED ORDER — HYDROCODONE-ACETAMINOPHEN 5-325 MG PO TABS
ORAL_TABLET | ORAL | Status: AC
  Filled 2016-06-26: qty 1

## 2016-06-26 MED ORDER — HYDROCODONE-ACETAMINOPHEN 5-325 MG PO TABS
1.0000 | ORAL_TABLET | Freq: Once | ORAL | Status: AC
  Administered 2016-06-26: 1 via ORAL

## 2016-06-26 NOTE — Progress Notes (Signed)
Pt stated he had pain medication at home for awhile for a toothache and now he requested pain medication here today during Magnesium infusion. He was asked to have his son bring back the pain medication bottle when he picks him up today but he stated he was out of medicine at home. He reports he will make an appt with dentist tomorrow.  Cyndee NP aware of request today, no acute visible issues in mouth near reported site of pain, several teeth already missing.  Pt asked to rate pain but he would not relate it to a specific # on the scale, just stated it hurts worse at night. Pt has been eating and drinking cold beverages and ice cream today without any distress.

## 2016-06-26 NOTE — Patient Instructions (Signed)
Magnesium Sulfate injection ?y l thu?c g? MAGNESIUM SULFATE l ch?t ?i?n gi?i d?ng tim th??ng ???c dng ?? ?i?u tr? m?c magnesium th?p ? trong mu v ?? phng ng?a ho?c ki?m sot m?t s? lo?i co gi?t. Thu?c ny c th? ???c dng cho nh?ng m?c ?ch khc; hy h?i ng??i cung c?p d?ch v? y t? ho?c d??c s? c?a mnh, n?u qu v? c th?c m?c. Ti c?n ph?i bo cho ng??i cung c?p d?ch v? y t? c?a mnh ?i?u g tr??c khi dng thu?c ny? H? c?n bi?t li?u qu v? c b?t k? tnh tr?ng no sau ?y khng: -b?nh tim -ti?n s? tim ??p khng ??u -b?nh th?n -pha?n ??ng b?t th???ng ho??c di? ??ng v??i magnesium sulfate ho?c cc lo?i thu?c -pha?n ??ng b?t th???ng ho??c di? ??ng v??i th??c ph?m, thu?c nhu?m, ho??c ch?t ba?o qua?n -?ang c thai ho??c ??nh co? thai -?ang cho con bu? Ti nn s? d?ng thu?c ny nh? th? no? Thu?c ny ?? truy?n vo t?nh m?ch. Thu?c ny ???c s? d?ng b?i chuyn vin y t? ? b?nh vi?n ho?c ? phng m?ch. Hy bn v?i bc s? nhi khoa c?a qu v? v? vi?c dng thu?c ny ? tr? em. Thu?c ny c th? ???c k toa trong nh?ng tr??ng h?p ch?n l?c, nh?ng c?n ph?i th?n tr?ng. Qu li?u: N?u qu v? cho r?ng mnh ? dng qu nhi?u thu?c ny, th hy lin l?c v?i trung tm ki?m sot ch?t ??c ho?c phng c?p c?u ngay l?p t?c. L?U : Thu?c ny ch? dnh ring cho qu v?. Khng chia s? thu?c ny v?i nh?ng ng??i khc. N?u ti l? qun m?t li?u th sao? ?i?u ny khng p d?ng. Nh?ng g c th? t??ng tc v?i thu?c ny? Thu?c ny c th? t??ng tc v?i cc thu?c sau ?y: -m?t s? thu?c dng cho ch?ng lo u ho?c cc v?n ?? v? gi?c ng? -m?t s? thu?c dng cho cc ch?ng co gi?t, ch?ng h?n nh? phenobarbital -digoxin -cc thu?c lm gin c? ?? gi?i ph?u -cc thu?c gi?m ?au gy ng? (narcotic) Danh sch ny c th? khng m t? ?? h?t cc t??ng tc c th? x?y ra. Hy ??a cho ng??i cung c?p d?ch v? y t? c?a mnh danh sch t?t c? cc thu?c, th?o d??c, cc thu?c khng c?n toa, ho?c cc ch? ph?m b? sung m qu v? dng. C?ng nn  bo cho h? bi?t r?ng qu v? c ht thu?c, u?ng r??u, ho?c c s? d?ng ma ty tri php hay khng. Vi th? c th? t??ng tc v?i thu?c c?a qu v?. Ti c?n ph?i theo di ?i?u g trong khi dng thu?c ny? Qu v? s? ???c theo di ch?t ch? trong khi dng thu?c ny. Qu v? c th? c?n ph?i ???c lm xt nghi?m mu trong th?i gian dng thu?c ny. Ti c th? nh?n th?y nh?ng tc d?ng ph? no khi dng thu?c ny? Nh?ng tc d?ng ph? qu v? c?n ph?i bo cho bc s? ho?c chuyn vin y t? cng s?m cng t?t: -cc ph?n ?ng d? ?ng, ch?ng h?n nh? da b? m?n ??, ng?a, n?i my ?ay, s?ng ? m?t, mi, ho?c l??i -ph?ng m?t -y?u c? b?p -cc d?u hi?u v tri?u ch?ng huy?t p th?p, ch?ng h?n nh? chng m?t; c?m th?y chong vng ho?c ng?t x?u, b? ng; c?m th?y y?u ?t ho?c m?t m?i khc th??ng -cc d?u hi?u v tri?u ch?ng c?a s? thay ??i nguy hi?m c?a nh?p tim, ch?ng h?n nh? ?au ng?c, chng m?t,  nh?p tim nhanh ho?c khng ??u, ?nh tr?ng ng?c; cc v?n ?? v? h h?p -v m? hi Danh sch ny c th? khng m t? ?? h?t cc tc d?ng ph? c th? x?y ra. Xin g?i t?i bc s? c?a mnh ?? ???c c? v?n chuyn mn v? cc tc d?ng ph?Sander Nephew v? c th? t??ng trnh cc tc d?ng ph? cho FDA theo s? [phone]. Ti nn c?t gi? thu?c c?a mnh ? ?u? Thu?c ny ???c s? d?ng b?i chuyn vin y t? ? b?nh vi?n ho?c ? phng m?ch. Qu v? s? khng ???c c?p thu?c ny ?? c?t gi? t?i nh. L?U : ?y l b?n tm t?t. N c th? khng bao hm t?t c? thng tin c th? c. N?u qu v? th?c m?c v? thu?c ny, xin trao ??i v?i bc s?, d??c s?, ho?c ng??i cung c?p d?ch v? y t? c?a mnh.    2016, Elsevier/Gold Standard. (2013-06-23 00:00:00)

## 2016-06-27 ENCOUNTER — Ambulatory Visit (HOSPITAL_BASED_OUTPATIENT_CLINIC_OR_DEPARTMENT_OTHER): Payer: PPO

## 2016-06-27 VITALS — BP 143/66 | HR 62 | Temp 98.6°F | Resp 18

## 2016-06-27 DIAGNOSIS — Z452 Encounter for adjustment and management of vascular access device: Secondary | ICD-10-CM | POA: Diagnosis not present

## 2016-06-27 DIAGNOSIS — C187 Malignant neoplasm of sigmoid colon: Secondary | ICD-10-CM | POA: Diagnosis not present

## 2016-06-27 MED ORDER — SODIUM CHLORIDE 0.9 % IJ SOLN
10.0000 mL | INTRAMUSCULAR | Status: DC | PRN
  Administered 2016-06-27: 10 mL
  Filled 2016-06-27: qty 10

## 2016-06-27 MED ORDER — HEPARIN SOD (PORK) LOCK FLUSH 100 UNIT/ML IV SOLN
500.0000 [IU] | Freq: Once | INTRAVENOUS | Status: AC | PRN
  Administered 2016-06-27: 500 [IU]
  Filled 2016-06-27: qty 5

## 2016-06-30 ENCOUNTER — Telehealth: Payer: Self-pay | Admitting: Hematology

## 2016-06-30 NOTE — Telephone Encounter (Signed)
Left message for patient via [place] [phone] re appointments for 9/6 and 9/11. Patient to get new schedule at next visit.

## 2016-07-03 ENCOUNTER — Ambulatory Visit (HOSPITAL_BASED_OUTPATIENT_CLINIC_OR_DEPARTMENT_OTHER): Payer: PPO

## 2016-07-03 VITALS — BP 127/64 | HR 64 | Temp 98.3°F | Resp 18

## 2016-07-03 DIAGNOSIS — Z95828 Presence of other vascular implants and grafts: Secondary | ICD-10-CM

## 2016-07-03 DIAGNOSIS — D509 Iron deficiency anemia, unspecified: Secondary | ICD-10-CM

## 2016-07-03 MED ORDER — HEPARIN SOD (PORK) LOCK FLUSH 100 UNIT/ML IV SOLN
500.0000 [IU] | Freq: Once | INTRAVENOUS | Status: AC | PRN
  Administered 2016-07-03: 500 [IU] via INTRAVENOUS
  Filled 2016-07-03: qty 5

## 2016-07-03 MED ORDER — SODIUM CHLORIDE 0.9 % IJ SOLN
10.0000 mL | INTRAMUSCULAR | Status: DC | PRN
  Administered 2016-07-03: 10 mL via INTRAVENOUS
  Filled 2016-07-03: qty 10

## 2016-07-03 MED ORDER — SODIUM CHLORIDE 0.9 % IV SOLN
Freq: Once | INTRAVENOUS | Status: AC
  Administered 2016-07-03: 14:00:00 via INTRAVENOUS
  Filled 2016-07-03: qty 250

## 2016-07-03 NOTE — Patient Instructions (Signed)

## 2016-07-08 ENCOUNTER — Ambulatory Visit (HOSPITAL_BASED_OUTPATIENT_CLINIC_OR_DEPARTMENT_OTHER): Payer: PPO

## 2016-07-08 ENCOUNTER — Telehealth: Payer: Self-pay | Admitting: Hematology

## 2016-07-08 ENCOUNTER — Ambulatory Visit (HOSPITAL_BASED_OUTPATIENT_CLINIC_OR_DEPARTMENT_OTHER): Payer: PPO | Admitting: Hematology

## 2016-07-08 ENCOUNTER — Encounter: Payer: Self-pay | Admitting: Hematology

## 2016-07-08 ENCOUNTER — Other Ambulatory Visit (HOSPITAL_BASED_OUTPATIENT_CLINIC_OR_DEPARTMENT_OTHER): Payer: PPO

## 2016-07-08 VITALS — BP 124/61 | HR 73 | Temp 98.6°F | Resp 17 | Ht 65.0 in | Wt 143.9 lb

## 2016-07-08 DIAGNOSIS — Z452 Encounter for adjustment and management of vascular access device: Secondary | ICD-10-CM | POA: Diagnosis not present

## 2016-07-08 DIAGNOSIS — L270 Generalized skin eruption due to drugs and medicaments taken internally: Secondary | ICD-10-CM

## 2016-07-08 DIAGNOSIS — Z5112 Encounter for antineoplastic immunotherapy: Secondary | ICD-10-CM

## 2016-07-08 DIAGNOSIS — D509 Iron deficiency anemia, unspecified: Secondary | ICD-10-CM

## 2016-07-08 DIAGNOSIS — D6481 Anemia due to antineoplastic chemotherapy: Secondary | ICD-10-CM

## 2016-07-08 DIAGNOSIS — C187 Malignant neoplasm of sigmoid colon: Secondary | ICD-10-CM

## 2016-07-08 DIAGNOSIS — E119 Type 2 diabetes mellitus without complications: Secondary | ICD-10-CM

## 2016-07-08 DIAGNOSIS — I5042 Chronic combined systolic (congestive) and diastolic (congestive) heart failure: Secondary | ICD-10-CM

## 2016-07-08 DIAGNOSIS — C7972 Secondary malignant neoplasm of left adrenal gland: Secondary | ICD-10-CM | POA: Diagnosis not present

## 2016-07-08 DIAGNOSIS — C787 Secondary malignant neoplasm of liver and intrahepatic bile duct: Secondary | ICD-10-CM

## 2016-07-08 DIAGNOSIS — D500 Iron deficiency anemia secondary to blood loss (chronic): Secondary | ICD-10-CM

## 2016-07-08 DIAGNOSIS — I10 Essential (primary) hypertension: Secondary | ICD-10-CM

## 2016-07-08 DIAGNOSIS — Z5111 Encounter for antineoplastic chemotherapy: Secondary | ICD-10-CM

## 2016-07-08 DIAGNOSIS — Z95828 Presence of other vascular implants and grafts: Secondary | ICD-10-CM

## 2016-07-08 DIAGNOSIS — C7800 Secondary malignant neoplasm of unspecified lung: Secondary | ICD-10-CM

## 2016-07-08 DIAGNOSIS — C772 Secondary and unspecified malignant neoplasm of intra-abdominal lymph nodes: Secondary | ICD-10-CM

## 2016-07-08 DIAGNOSIS — C786 Secondary malignant neoplasm of retroperitoneum and peritoneum: Secondary | ICD-10-CM | POA: Diagnosis not present

## 2016-07-08 LAB — FERRITIN: FERRITIN: 715 ng/mL — AB (ref 22–316)

## 2016-07-08 LAB — CBC WITH DIFFERENTIAL/PLATELET
BASO%: 0.4 % (ref 0.0–2.0)
BASOS ABS: 0 10*3/uL (ref 0.0–0.1)
EOS ABS: 0.2 10*3/uL (ref 0.0–0.5)
EOS%: 3.3 % (ref 0.0–7.0)
HEMATOCRIT: 26.3 % — AB (ref 38.4–49.9)
HEMOGLOBIN: 8.6 g/dL — AB (ref 13.0–17.1)
LYMPH#: 0.6 10*3/uL — AB (ref 0.9–3.3)
LYMPH%: 13.9 % — ABNORMAL LOW (ref 14.0–49.0)
MCH: 27.6 pg (ref 27.2–33.4)
MCHC: 32.7 g/dL (ref 32.0–36.0)
MCV: 84.3 fL (ref 79.3–98.0)
MONO#: 0.7 10*3/uL (ref 0.1–0.9)
MONO%: 14.8 % — ABNORMAL HIGH (ref 0.0–14.0)
NEUT#: 3.1 10*3/uL (ref 1.5–6.5)
NEUT%: 67.6 % (ref 39.0–75.0)
NRBC: 0 % (ref 0–0)
Platelets: 140 10*3/uL (ref 140–400)
RBC: 3.12 10*6/uL — ABNORMAL LOW (ref 4.20–5.82)
RDW: 17.3 % — AB (ref 11.0–14.6)
WBC: 4.5 10*3/uL (ref 4.0–10.3)

## 2016-07-08 LAB — COMPREHENSIVE METABOLIC PANEL
ALT: 21 U/L (ref 0–55)
ANION GAP: 9 meq/L (ref 3–11)
AST: 21 U/L (ref 5–34)
Albumin: 3 g/dL — ABNORMAL LOW (ref 3.5–5.0)
Alkaline Phosphatase: 186 U/L — ABNORMAL HIGH (ref 40–150)
BILIRUBIN TOTAL: 0.48 mg/dL (ref 0.20–1.20)
BUN: 10.1 mg/dL (ref 7.0–26.0)
CALCIUM: 7.8 mg/dL — AB (ref 8.4–10.4)
CHLORIDE: 110 meq/L — AB (ref 98–109)
CO2: 22 meq/L (ref 22–29)
CREATININE: 0.8 mg/dL (ref 0.7–1.3)
EGFR: >90 mL/min/{1.73_m2} (ref >90)
Glucose: 217 mg/dl — ABNORMAL HIGH (ref 70–140)
Potassium: 3.8 mEq/L (ref 3.5–5.1)
Sodium: 141 mEq/L (ref 136–145)
TOTAL PROTEIN: 6.8 g/dL (ref 6.4–8.3)

## 2016-07-08 LAB — CEA (IN HOUSE-CHCC): CEA (CHCC-In House): 834.19 ng/mL — ABNORMAL HIGH (ref 0.00–5.00)

## 2016-07-08 LAB — MAGNESIUM

## 2016-07-08 MED ORDER — SODIUM CHLORIDE 0.9 % IV SOLN
140.0000 mg/m2 | Freq: Once | INTRAVENOUS | Status: AC
  Administered 2016-07-08: 240 mg via INTRAVENOUS
  Filled 2016-07-08: qty 2

## 2016-07-08 MED ORDER — SODIUM CHLORIDE 0.9 % IJ SOLN
10.0000 mL | INTRAMUSCULAR | Status: DC | PRN
  Filled 2016-07-08: qty 10

## 2016-07-08 MED ORDER — SODIUM CHLORIDE 0.9 % IV SOLN
1800.0000 mg/m2 | INTRAVENOUS | Status: DC
  Administered 2016-07-08: 3050 mg via INTRAVENOUS
  Filled 2016-07-08: qty 61

## 2016-07-08 MED ORDER — SODIUM CHLORIDE 0.9 % IV SOLN
Freq: Once | INTRAVENOUS | Status: AC
  Administered 2016-07-08: 12:00:00 via INTRAVENOUS

## 2016-07-08 MED ORDER — ATROPINE SULFATE 1 MG/ML IJ SOLN
0.5000 mg | Freq: Once | INTRAMUSCULAR | Status: AC | PRN
  Administered 2016-07-08: 0.5 mg via INTRAVENOUS

## 2016-07-08 MED ORDER — SODIUM CHLORIDE 0.9 % IV SOLN
Freq: Once | INTRAVENOUS | Status: AC
  Administered 2016-07-08: 12:00:00 via INTRAVENOUS
  Filled 2016-07-08: qty 4

## 2016-07-08 MED ORDER — HEPARIN SOD (PORK) LOCK FLUSH 100 UNIT/ML IV SOLN
500.0000 [IU] | Freq: Once | INTRAVENOUS | Status: DC | PRN
  Filled 2016-07-08: qty 5

## 2016-07-08 MED ORDER — SODIUM CHLORIDE 0.9 % IJ SOLN
10.0000 mL | INTRAMUSCULAR | Status: DC | PRN
  Administered 2016-07-08: 10 mL via INTRAVENOUS
  Filled 2016-07-08: qty 10

## 2016-07-08 MED ORDER — ATROPINE SULFATE 1 MG/ML IJ SOLN
INTRAMUSCULAR | Status: AC
  Filled 2016-07-08: qty 1

## 2016-07-08 MED ORDER — SODIUM CHLORIDE 0.9 % IV SOLN
6.3000 mg/kg | Freq: Once | INTRAVENOUS | Status: AC
  Administered 2016-07-08: 400 mg via INTRAVENOUS
  Filled 2016-07-08: qty 20

## 2016-07-08 NOTE — Addendum Note (Signed)
Addended by: Truitt Merle on: 07/08/2016 11:54 AM   Modules accepted: Orders

## 2016-07-08 NOTE — Progress Notes (Signed)
[place]  [phone] [phone]  Clinic follow up Note   Patient Care Team: Jason Junior, MD as PCP - General (Specialist) Jason Reilly, CRNP as Nurse Practitioner (Nurse Practitioner) Jason Merle, MD as Consulting Physician (Hematology) Jason Artist, MD as Consulting Physician (Gastroenterology) 07/08/2016  CHIEF COMPLAINTS Follow up metastatic sigmoid colon cancer  Oncology History   He is a Metastatic colon cancer to liver   Staging form: Colon and Rectum, AJCC 7th Edition     Clinical: Stage Unknown ([place], NX, M1) - Unsigned      Cancer of sigmoid colon ([place])   10/28/2014 Tumor Marker    AFP 3.2 CEA > 10,000 CA 19-9 12,929.6. tumor (-) KRAS and NRAS mutation.       11/10/2014 Imaging    PET scan showed hypermetabolic mass in the sigmoid colon was noted metastasis in the retroperitoneum. Probable left adrenal and pulmonary metastasis, and diffuse liver metastasis.      11/21/2014 Initial Diagnosis    Metastatic colon cancer to liver, lung, abd nodes and left adrenal gland. Diagnosis was made by liver biopsy.       11/30/2014 - 05/23/2015 Chemotherapy    First line chemo [id number], Panitumumab added from second cycle, chemo held due to recurrent GI bleeding        12/28/2014 - 12/31/2014 Hospital Admission    Was admitted for dehydration, neutropenia fever with UTI, and severe skin rash.      02/08/2015 - 02/12/2015 Hospital Admission    He was admitted for upper GI bleeding, e.g. showed a gastric ulcer with clots, status post appendectomy injection. He also received a blood transfusion.      03/01/2015 Tumor Marker    CEA 694, CA19.9 462      03/13/2015 Imaging    CT CAP showed partial response, no new lesions.       05/31/2015 - 06/02/2015 Hospital Admission    He was admitted to [facility] in [place] due to upper GI bleeding, EGD showed gastric and dudenal ulcers       06/27/2015 - 06/29/2015 Hospital Admission    he was admitted  for dairrhea and pancolitis, c-diff and stool cultures were negative, treated with antibiotics      07/12/2015 -  Chemotherapy    panitumumab 59m/kg, every 2 weeks      09/19/2015 -  Chemotherapy    Irinotecan 1863mm2 every 2 weeks, dose reduction due to diarrhea,       11/08/2015 - 11/10/2015 Hospital Admission    Pt was admitted for sepsis and hypotension, was found to have (+) influenza A and treated.       11/30/2015 Imaging     CT scans reviewed continued improvement in the liver metastasis, no other new lesions.      05/06/2016 Tumor Marker    CEA has gradually increased from 110 in March 2017 to 566 in July 2070      06/04/2016 Imaging    Restaging CT scan showed similar to mild increase in hepatic metastasis, mild enlargement of right adrenal nodule, primary similar pulmonary nodules. A left upper lobe nodule has resolved.        HISTORY OF PRESENTING ILLNESS:  Jason Reilly [age].o. male is here because of abnormal CT findings, which is very suspicious for malignancy. [place] He came in with his son and an interpreter.  He has been feeling fatigued  since two month ago. He is still able to do all ADLs. He otherwise denies any pain, bloating or nausea.  He lost about 20lbs in 3 month. His appetite is lower than before, eats less, no change of his bowl habits.  She denied any hematochezia or melana. Per his son, he has had some personality changes daily, irritable, slightly confused some time.  He was evaluated by his primary care physician. Lab test reviewed hepatitis B infection, which he did not know before, and elevated alkaline phosphatase, his liver function and the rest of the liver function was not remarkable. [place] of abdomen was obtained on 07/22/2014, which showed diffusely abnormal liver with multiple echogenic lesions. CT of abdomen with and without contrast was done on 08/26/2014, which reviewed here at a medically  with multiple large partially calcified hepatic masses consistent with metastatic disease. Mild retroperitoneal adenopathy with the largest node measuring 1.6 cm. And nonspecific 1.4 cm left adrenal nodule was also noticed. His tumor marker showed CEA greater than 10,000, CA 19-9 12,929, AFP 3.2 (normal). He was referred to [facility] and was evaluated by nurse practitioner Jason Reilly. Treatment for hepatitis B was not recommended based on his virus load.  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 He was evaluated by a cardiologist in 2014. He denies any significant dyspnea on exertion. No leg swollen.  CURRENT THERAPY:  1. panitumumab 72m/kg, every 2 weeks, started on 07/12/2015 2. Irinotecan 1832mm2 every 2 weeks added on 09/19/2015, dose decreased to 16048m2 from cycle 2, and further decreased to 125m60m from cycle 10 due to diarrhea and anorexia, changed to 140mg39mfrom cycle 13. 3. 5-FU 1800mg/47mver 46 hours added back from 06/10/2016 due to mild disease progression 4. He also receives IV magnesium 6g weekly   INTERIM HISTORY: Jason Reilly for follow-up and chemotherapy. He is accompanied by his son to clinic today. He is doing well overall, denies any significant side effects from chemotherapy. He has good appetite and energy level, remains to be physically active at home. His skin rashes stable, no itchiness or pain. No other complaints. His weight has been stable.  MEDICAL HISTORY:  Past Medical History:  Diagnosis Date  . Abnormal EKG   . Diabetes mellitus without complication (Jason Reilly)    metformin  . Gastric ulcer   . H. pylori infection   . Hepatitis B   . Hypertension   . Metastatic colon cancer to liver (Jason Reilly)  [place]mic cardiomyopathy (Jason Reilly)  Jason HillsTachycardia     SURGICAL HISTORY: Past Surgical History:  Procedure Laterality Date  . ESOPHAGOGASTRODUODENOSCOPY N/A 02/08/2015   Procedure:  ESOPHAGOGASTRODUODENOSCOPY (EGD);  Surgeon: Jason Reilly [facility];  Service: Endoscopy;  Laterality: N/A;  . ESOPHAGOGASTRODUODENOSCOPY (EGD) WITH PROPOFOL N/A 08/15/2015   Procedure: ESOPHAGOGASTRODUODENOSCOPY (EGD) WITH PROPOFOL;  Surgeon: Jason Reilly Location: [facility];  Service: Endoscopy;  Laterality: N/A;  . LEFT AND RIGHT HEART CATHETERIZATION WITH CORONARY ANGIOGRAM N/A 03/29/2013   Procedure: LEFT AND RIGHT HEART CATHETERIZATION WITH CORONARY ANGIOGRAM;  Surgeon: [place] [facility];  Service: Cardiovascular;  Laterality: N/A;  . Nuclear Stress Test  03/03/2013   High risk - consistent with nonischemic cardiomyopathy    SOCIAL HISTORY: Social History   Social History  . Marital status: Married    Spouse name: N/A  . Number of children: N/A  . Years of education: N/A  Occupational History  . Not on file.   Social History Main Topics  . Smoking status: Former Smoker    Types: Cigarettes    Quit date: 11/07/2008  . Smokeless tobacco: Never Used  . Alcohol use Yes     Comment: occasional  . Drug use: No  . Sexual activity: No   Other Topics Concern  . Not on file   Social History Narrative   Married   Enjoys walking   Has lived in [place] > 16 years    FAMILY HISTORY: No family history of liver disease or malignancy.  ALLERGIES:  has No Known Allergies.  MEDICATIONS:  Current Outpatient Prescriptions on File Prior to Visit  Medication Sig Dispense Refill  . clindamycin (CLINDAGEL) 1 % gel Apply topically 2 (two) times daily. 30 g 2  . diphenoxylate-atropine (LOMOTIL) 2.5-0.025 MG tablet Take 1-2 tablets by mouth 4 (four) times daily as needed for diarrhea or loose stools. 30 tablet 0  . folic acid (FOLVITE) 1 MG tablet Take 1 tablet (1 mg total) by mouth daily. 30 tablet 3  . hydrocortisone 1 % ointment Apply 1 application topically 2 (two) times daily. 56 g 0  . lidocaine-prilocaine (EMLA) cream Apply 1  application topically as needed. Apply to [facility] cath at least one hour before needle stick. 30 g 2  . lisinopril-hydrochlorothiazide (PRINZIDE,ZESTORETIC) 10-12.5 MG tablet     . loperamide (IMODIUM) 2 MG capsule Take 2 capsules (4 mg total) by mouth 4 (four) times daily as needed for diarrhea or loose stools (NO MORE THAN 8 TABLETS PER DAY.). 60 capsule 3  . magic mouthwash SOLN Take 5 mLs by mouth 4 (four) times daily. 120 mL 1  . magnesium oxide (MAG-OX) 400 (241.3 Mg) MG tablet Take 1 tablet (400 mg total) by mouth 3 (three) times daily. 90 tablet 3  . metFORMIN (GLUCOPHAGE) 500 MG tablet Take 1 tablet (500 mg total) by mouth 2 (two) times daily with a meal. 60 tablet 0  . nadolol (CORGARD) 20 MG tablet Take 1 tablet (20 mg total) by mouth 2 (two) times daily. THIS IS A ONE TIME ORDER.  FUTURE REFILLS NEED TO BE DONE BY PRIMARY MD. 60 tablet 1  . ondansetron (ZOFRAN) 8 MG tablet Take 1 tablet (8 mg total) by mouth every 8 (eight) hours as needed for nausea or vomiting. 45 tablet 2  . pantoprazole (PROTONIX) 40 MG tablet Take 1 tablet (40 mg total) by mouth daily. 30 tablet 2  . prochlorperazine (COMPAZINE) 10 MG tablet Take 1 tablet (10 mg total) by mouth every 6 (six) hours as needed for nausea or vomiting. 30 tablet 2   Current Facility-Administered Medications on File Prior to Visit  Medication Dose Route Frequency Provider Last Rate Last Dose  . 0.9 %  sodium chloride infusion   Intravenous Once Jason Merle, MD      . ferumoxytol [facility]) 510 mg in sodium chloride 0.9 % 100 mL IVPB  510 mg Intravenous Once Jason Merle, MD      . sodium chloride 0.9 % injection 10 mL  10 mL Intracatheter PRN Jason Merle, MD   10 mL at 10/03/15 1705  . sodium chloride 0.9 % injection 10 mL  10 mL Intravenous PRN Jason Merle, MD   10 mL at 05/14/16 1127  ;   REVIEW OF SYSTEMS:   Constitutional: Denies fevers, chills or abnormal night sweats, (+) fatigue  Eyes: Denies blurriness of vision, double vision or watery  eyes Ears,  nose, mouth, throat, and face: Denies mucositis or sore throat Respiratory: Denies cough, dyspnea or wheezes Cardiovascular: Denies palpitation, chest discomfort or lower extremity swelling Gastrointestinal: Denies nausea, heartburn or change in bowel habits Skin: Denies abnormal skin rashes Lymphatics: Denies new lymphadenopathy or easy bruising Neurological:Denies numbness, tingling or new weaknesses Behavioral/Psych: Mood is stable, no new changes, (+) insomnia All other systems were reviewed with the patient and are negative.  PHYSICAL EXAMINATION: BP 124/61 (BP Location: Left Arm, Patient Position: Sitting)   Pulse 73   Temp 98.6 F (37 C) (Oral)   Resp 17   Ht _0  (1.651 m)   Wt 143 lb 14.4 oz (65.3 kg)   SpO2 100%   BMI 23.95 kg/m   ECOG PERFORMANCE STATUS: 1 Vital sign were taken at in the infusion room, within normal limits. GENERAL:alert, no distress and comfortable SKIN: (+) Dry skin,  no skin ulcer, (+) scatter skin rashes on his neck and upper chest, some are resolving with skin pigmentation. No discharge. EYES: normal, conjunctiva are pink and non-injected, sclera clear OROPHARYNX:no exudate, no erythema and lips, buccal mucosa, and tongue with mild discoloration at the tip.  NECK: supple, thyroid normal size, non-tender, without nodularity LYMPH:  no palpable lymphadenopathy in the cervical, axillary or inguinal LUNGS: clear to auscultation and percussion with normal breathing effort, no rales   HEART: regular rate & rhythm and no murmurs and no lower extremity edema ABDOMEN:abdomen soft, non-tender, no hepatomegaly, no splenomegaly and normal bowel sounds Musculoskeletal:no cyanosis of digits and no clubbing  PSYCH: alert & oriented x 3 with fluent speech NEURO: no focal motor/sensory deficits  LABORATORY DATA:  I have reviewed the data as listed CBC Latest Ref Rng & Units 07/08/2016 06/25/2016 06/10/2016  WBC 4.0 - 10.3 10e3/uL 4.5 3.1(L) 2.9(L)    Hemoglobin 13.0 - 17.1 g/dL 8.6(L) 8.5(L) 8.1(L)  Hematocrit 38.4 - 49.9 % 26.3(L) 25.8(L) 25.3(L)  Platelets 140 - 400 10e3/uL 140 109(L) 130(L)    CMP Latest Ref Rng & Units 06/25/2016 06/10/2016 05/27/2016  Glucose 70 - 140 mg/dl 241(H) 125 79  BUN 7.0 - 26.0 mg/dL 9.8 7.3 8.1  Creatinine 0.7 - 1.3 mg/dL 0.8 0.7 0.8  Sodium 136 - 145 mEq/L 142 139 142  Potassium 3.5 - 5.1 mEq/L 4.1 4.2 4.2  Chloride 101 - 111 mmol/L - - -  CO2 22 - 29 mEq/L _1 Calcium 8.4 - 10.4 mg/dL 8.4 7.7(L) 8.9  Total Protein 6.4 - 8.3 g/dL 6.6 6.5 7.4  Total Bilirubin 0.20 - 1.20 mg/dL 0.65 0.47 0.46  Alkaline Phos 40 - 150 U/L 176(H) 161(H) 194(H)  AST 5 - 34 U/L 31 30 32  ALT 0 - 55 U/L _2 INITIAL tumor markers AFP 3.2 CEA > 10,000 CA 19-9 12,929.6  Results for GOR, VESTAL (MRN [id number]) as of 06/25/2016 20:38  Ref. Range 02/19/2016 08:24 03/12/2016 13:11 04/09/2016 10:22 05/06/2016 13:23 06/10/2016 10:15  CEA Latest Units: ng/mL 134.6 (H)      CEA Latest Ref Range: 0.0 - 4.7 ng/mL 155.8 (H) 221.4 (H) 289.7 (H) 566.1 (H) 639.7 (H)  CEA ([facility]) Latest Ref Range: 0.00 - 5.00 ng/mL     780.47 (H)    Pathology report  Liver, needle/core biopsy - METASTATIC ADENOCARCINOMA, SEE COMMENT. Microscopic Comment The adenocarcinoma demonstrates the following immunophenotype: Cytokeratin 7 - negative expression. Cytokeratin 20 - strong diffuse expression. CD2 - strong diffuse expression. Overall the morphology and immunophenotype are that of  metastatic adenocarcinoma primary to colorectum. The recent nuclear medicine scan demonstrating sigmoid mass with associated liver masses is noted.  [facility] test result:    RADIOGRAPHIC STUDIES: I have personally reviewed the outside CT scan image with patient and his son.   CT chest, abdomen and pelvis with IV contrast oN  06/04/2016 IMPRESSION: 1. Similar to mild increase in hepatic metastasis. 2. Primarily similar pulmonary nodules. A left  upper lobe nodule has resolved. 3. Mild enlargement of a right adrenal nodule. 4. No new sites of metastatic disease identified. 5.  Coronary artery atherosclerosis. Aortic atherosclerosis. 6. 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 Recannulized paraumbilical vein is consistent with chronic portal venous hypertension.   ASSESSMENT & PLAN:  70 year old [place] male, with past history of hypertension and dilated nonischemic gammopathy with EF 25%, no clinical signs of heart failure, who was found to have hepatitis B infection lately, and multiple liver lesions on the CT scan. He has extremely high CEA and CA 19-9 levels. PET scan reviewed a hypermetabolic sigmoid colon mass, diffuse liver metastasis, probable lung and adrenal gland metastasis.  1. Metastatic sigmoid colon cancer, with diffuse liver, lungs, node and left adrenal gland metastases. KRAS/NRAS wild type, MSI-stable -Liver biopsy showed metastatic adenocarcinoma. His tumor were strongly positive for CK20 and CD2, consistent with primary colorectal primary. KRAS and NRAS mutations were not detected.  -Pt understands that this is incurable cancer, and he has very high disease burden and overall prognosis is poor. The treatment goal is palliative -I discussed his restaging CT from 06/04/2016 which showed overall stable disease, mild increase in hepatic metastasis. His tumor marker CEA has been trending up, corresponding to mild disease progression. -I have added 5-FU back, and continue irinotecan and panitumumab, he has been tolerating well -He is clinical doing well, lab reviewed, adequate for treatment, we'll proceed chemo today   2. Grade 1-2 skin rashes, stable  -Secondary to panitumumab, slightly worse today -continue hydrocortisone 2.5%, and clindamycin gel 1%  twice daily as needed  -He knows to avoid sun exposure, and call me if it gets worse.  3. Gastric ulcer with significant GI bleeding in April  and Aug 2016 -He is on PPI, continue once daily  -Repeat his EGD on 08/15/2015 showed near complete healing of his gastric ulcer -continue Nadolol 32m bid, per Dr. SFuller Plan- I encourage him to follow-up [facility] since he will resume 5-FU  4. Type 2 DM  -His glucose level has increased lately, I encouraged him to watch his diet, avoid any sweets, and monitor closely at home -I encourage him to follow-up with his primary care physician Dr. WJimmye [place]  5. HTN, Dilated nonischemic ischemia cardiomyopathy with EF 25% -He is clinically doing well without symptoms of CHF. However this is probably going to impact his chemotherapy.Will try to avoid cardiotoxic chemotherapy agent and avoid fluid overload during chemotherapy. -Continue follow-up with cardiology. -will hold on his lisinopril-HCTZ for now, his BP has been normal lately   6 Hepatitis B carrier, with mild portal hypertension  -Per liver clinic, no need for treatment. Follow-up with Dr. SSilvio Pate  7. Malnutrition -I encouraged him to eat more, and take supplements as needed. -improved, gained weight back after I reduce his chemotherapy dose  -follow up with Dietitian   8. Anemia secondary to GI bleeding, iron deficiency and chemo  -Repeat lab on 06/06/2015 showed ferritin 117, serum iron 24, saturation 8%, which supports iron deficiency -He received  IV Feraheme again in Aug 2016 after GI bleeding  -Repeat a ferritin was 273 on 10/03/2015, much improved  -he received iv feraheme again on 11/27/2015 and 12/04/2015, but anemia did not improve much. -His anemia has been getting worse lately, probably related to chemotherapy, we'll close monitor any signs of GI bleeding. -Blood transfusion if hemoglobin less than 8 or symptomatic anemia with hemoglobin 8-9 -Hemoglobin 8.6 today, not symptomatic, will continue monitoring  11. hypomagnesemia  -secondary to panitumumab -he receives IV mag 6 g every week on  Wednesdays -He is taking magnesium pill 2 tablets 3 times a day -follow up weekly    Plan -Lab results reviewed, adequate for treatment, we'll proceed FOLFIRI and panitumumab today and continue every 2 weeks.  -magnesium infusion weekly on Wednesday  -I'll see him back in 2 weeks   All questions were answered. The patient knows to call the clinic with any problems, questions or concerns.  I spent 25 minutes counseling the patient face to face. The total time spent in the appointment was 30 minutes and more than 50% was on counseling.     Jason Merle, MD 07/08/16

## 2016-07-08 NOTE — Patient Instructions (Signed)

## 2016-07-08 NOTE — Progress Notes (Signed)
Dr Burr Medico aware Mag level today 0.7. Ok to treat per dr Burr Medico. Pt had appt for mag replace 07/08/16

## 2016-07-08 NOTE — Telephone Encounter (Signed)
Gave patient avs report and appointments for September and October  °

## 2016-07-08 NOTE — Telephone Encounter (Signed)
Gave patient son avs report and appointments for September and October.

## 2016-07-08 NOTE — Patient Instructions (Signed)
[facility] Discharge Instructions for Patients Receiving Chemotherapy  Today you received the following chemotherapy agents: Vecitix, Camptosar and Adrucil pump.  To help prevent nausea and vomiting after your treatment, we encourage you to take your nausea medication as prescribed by your doctor. If you develop nausea and vomiting that is not controlled by your nausea medication, call the clinic.   BELOW ARE SYMPTOMS THAT SHOULD BE REPORTED IMMEDIATELY:  *FEVER GREATER THAN 100.5 F  *CHILLS WITH OR WITHOUT FEVER  NAUSEA AND VOMITING THAT IS NOT CONTROLLED WITH YOUR NAUSEA MEDICATION  *UNUSUAL SHORTNESS OF BREATH  *UNUSUAL BRUISING OR BLEEDING  TENDERNESS IN MOUTH AND THROAT WITH OR WITHOUT PRESENCE OF ULCERS  *URINARY PROBLEMS  *BOWEL PROBLEMS  UNUSUAL RASH Items with * indicate a potential emergency and should be followed up as soon as possible.  Feel free to call the clinic you have any questions or concerns. The clinic phone number is [phone].  Please show the [place] at check-in to the Emergency Department and triage nurse.

## 2016-07-09 ENCOUNTER — Ambulatory Visit (HOSPITAL_BASED_OUTPATIENT_CLINIC_OR_DEPARTMENT_OTHER): Payer: PPO

## 2016-07-09 VITALS — BP 139/56 | HR 60 | Temp 98.0°F | Resp 17

## 2016-07-09 DIAGNOSIS — C187 Malignant neoplasm of sigmoid colon: Secondary | ICD-10-CM

## 2016-07-09 DIAGNOSIS — Z5111 Encounter for antineoplastic chemotherapy: Secondary | ICD-10-CM

## 2016-07-09 LAB — CEA: CEA: 716.6 ng/mL — ABNORMAL HIGH (ref 0.0–4.7)

## 2016-07-09 MED ORDER — SODIUM CHLORIDE 0.9 % IV SOLN
1225.0000 mg/m2 | Freq: Once | INTRAVENOUS | Status: DC
  Administered 2016-07-09: 2100 mg via INTRAVENOUS
  Filled 2016-07-09: qty 42

## 2016-07-09 NOTE — Patient Instructions (Signed)
[facility] Discharge Instructions for Patients Receiving Chemotherapy  Today you received the following chemotherapy agents: Adrucil   To help prevent nausea and vomiting after your treatment, we encourage you to take your nausea medication as directed.    If you develop nausea and vomiting that is not controlled by your nausea medication, call the clinic.   BELOW ARE SYMPTOMS THAT SHOULD BE REPORTED IMMEDIATELY:  *FEVER GREATER THAN 100.5 F  *CHILLS WITH OR WITHOUT FEVER  NAUSEA AND VOMITING THAT IS NOT CONTROLLED WITH YOUR NAUSEA MEDICATION  *UNUSUAL SHORTNESS OF BREATH  *UNUSUAL BRUISING OR BLEEDING  TENDERNESS IN MOUTH AND THROAT WITH OR WITHOUT PRESENCE OF ULCERS  *URINARY PROBLEMS  *BOWEL PROBLEMS  UNUSUAL RASH Items with * indicate a potential emergency and should be followed up as soon as possible.  Feel free to call the clinic you have any questions or concerns. The clinic phone number is [phone].  Please show the CHEMO ALERT CARD at check-in to the Emergency Department and triage nurse.   

## 2016-07-09 NOTE — Progress Notes (Signed)
Pt reports to clinic and patient's daughter states that overnight the pump got disconnected. Pt unable to say when pump became disconnected. Pump reads 94.5 ml to give and 55.5 ml given, but pump was running and had not been turned off. Chemo and patient's bag disposed of properly, Dr Burr Medico and Pharmacy aware. Per [name] pump to be restarted as if it had been turned off at 6 am 07/09/16 to avoid over medicating. Pharmacy aware and pt to come to clinic at 1430 07/10/16 to receive Magnesium infusion which is compatible with 5FU pump per Pharmacy. Per [name] okay to increase pump rate to have pump end at 1730 07/10/16, pharmacy aware. Patients skin on chest cleaned thoroughly and pt educated to wash sheets immediately in case any chemo came in contact with them and the run washer through a wash cycle. Pt educated to report to clinic by 1415 07/10/16 and to contact number on pump if he experiences any trouble with the pump and they can help guide him to turn the pump off so that no chemo is not running trough a disconnected line. Pt and daughter verbalizes understanding

## 2016-07-10 ENCOUNTER — Ambulatory Visit (HOSPITAL_BASED_OUTPATIENT_CLINIC_OR_DEPARTMENT_OTHER): Payer: PPO

## 2016-07-10 VITALS — BP 133/60 | HR 55 | Temp 97.7°F | Resp 18

## 2016-07-10 DIAGNOSIS — Z95828 Presence of other vascular implants and grafts: Secondary | ICD-10-CM

## 2016-07-10 DIAGNOSIS — D509 Iron deficiency anemia, unspecified: Secondary | ICD-10-CM

## 2016-07-10 MED ORDER — SODIUM CHLORIDE 0.9 % IV SOLN
Freq: Once | INTRAVENOUS | Status: AC
  Administered 2016-07-10: 15:00:00 via INTRAVENOUS
  Filled 2016-07-10: qty 250

## 2016-07-10 MED ORDER — SODIUM CHLORIDE 0.9 % IJ SOLN
10.0000 mL | INTRAMUSCULAR | Status: DC | PRN
  Administered 2016-07-10: 10 mL via INTRAVENOUS
  Filled 2016-07-10: qty 10

## 2016-07-10 MED ORDER — HEPARIN SOD (PORK) LOCK FLUSH 100 UNIT/ML IV SOLN
500.0000 [IU] | Freq: Once | INTRAVENOUS | Status: AC | PRN
  Administered 2016-07-10: 500 [IU] via INTRAVENOUS
  Filled 2016-07-10: qty 5

## 2016-07-10 NOTE — Patient Instructions (Signed)
Hypomagnesemia °Hypomagnesemia is a condition in which the level of magnesium in the blood is low. Magnesium is a mineral that is found in many foods. It is used in many different processes in the body. Hypomagnesemia can affect every organ in the body. It can cause life-threatening problems. °CAUSES °Causes of hypomagnesemia include: °· Not getting enough magnesium in your diet. °· Malnutrition. °· Problems with absorbing magnesium from the intestines. °· Dehydration. °· Alcohol abuse. °· Vomiting. °· Severe diarrhea. °· Some medicines, including medicines that make you urinate more. °· Certain diseases, such as kidney disease, diabetes, and overactive thyroid. °SIGNS AND SYMPTOMS °· Involuntary shaking or trembling of a body part (tremor). °· Confusion. °· Muscle weakness. °· Sensitivity to light, sound, and touch. °· Psychiatric issues, such as depression, irritability, or psychosis. °· Sudden tightening of muscles (muscle spasms). °· Tingling in the arms and legs. °· A feeling of fluttering of the heart. °These symptoms are more severe if magnesium levels drop suddenly. °DIAGNOSIS °To make a diagnosis, your health care provider will do a physical exam and order blood and urine tests. °TREATMENT °Treatment will depend on the cause and the severity of your condition. It may involve: °· A magnesium supplement. This can be taken in pill form. It can also be given through an IV tube. This is usually done if the condition is severe. °· Changes to your diet. You may be directed to eat foods that have a lot of magnesium, such as green leafy vegetables, peas, beans, and nuts. °· Eliminating alcohol from your diet. °HOME CARE INSTRUCTIONS °· Include foods with magnesium in your diet. Foods that are rich in magnesium include green vegetables, beans, nuts and seeds, and whole grains. °· Take medicines only as directed by your health care provider. °· Take magnesium supplements if your health care provider instructs you to  do that. Take them as directed. °· Have your magnesium levels monitored as directed by your health care provider. °· When you are active, drink fluids that contain electrolytes. °· Keep all follow-up visits as directed by your health care provider. This is important. °SEEK MEDICAL CARE IF: °· You get worse instead of better. °· Your symptoms return. °SEEK IMMEDIATE MEDICAL CARE IF: °· Your symptoms are severe. °  °This information is not intended to replace advice given to you by your health care provider. Make sure you discuss any questions you have with your health care provider. °  °Document Released: 07/10/2005 Document Revised: 11/04/2014 Document Reviewed: 05/30/2014 °Elsevier Interactive Patient Education ©2016 [employer] ° °

## 2016-07-17 ENCOUNTER — Ambulatory Visit (HOSPITAL_BASED_OUTPATIENT_CLINIC_OR_DEPARTMENT_OTHER): Payer: PPO

## 2016-07-17 VITALS — BP 114/63 | HR 74 | Temp 98.9°F | Resp 20

## 2016-07-17 DIAGNOSIS — D509 Iron deficiency anemia, unspecified: Secondary | ICD-10-CM

## 2016-07-17 DIAGNOSIS — Z95828 Presence of other vascular implants and grafts: Secondary | ICD-10-CM

## 2016-07-17 MED ORDER — SODIUM CHLORIDE 0.9 % IJ SOLN
10.0000 mL | INTRAMUSCULAR | Status: DC | PRN
  Administered 2016-07-17: 10 mL via INTRAVENOUS
  Filled 2016-07-17: qty 10

## 2016-07-17 MED ORDER — SODIUM CHLORIDE 0.9 % IV SOLN
Freq: Once | INTRAVENOUS | Status: AC
  Administered 2016-07-17: 12:00:00 via INTRAVENOUS
  Filled 2016-07-17: qty 250

## 2016-07-17 MED ORDER — HEPARIN SOD (PORK) LOCK FLUSH 100 UNIT/ML IV SOLN
500.0000 [IU] | Freq: Once | INTRAVENOUS | Status: AC | PRN
  Administered 2016-07-17: 500 [IU] via INTRAVENOUS
  Filled 2016-07-17: qty 5

## 2016-07-17 NOTE — Patient Instructions (Signed)
Hypomagnesemia °Hypomagnesemia is a condition in which the level of magnesium in the blood is low. Magnesium is a mineral that is found in many foods. It is used in many different processes in the body. Hypomagnesemia can affect every organ in the body. It can cause life-threatening problems. °CAUSES °Causes of hypomagnesemia include: °· Not getting enough magnesium in your diet. °· Malnutrition. °· Problems with absorbing magnesium from the intestines. °· Dehydration. °· Alcohol abuse. °· Vomiting. °· Severe diarrhea. °· Some medicines, including medicines that make you urinate more. °· Certain diseases, such as kidney disease, diabetes, and overactive thyroid. °SIGNS AND SYMPTOMS °· Involuntary shaking or trembling of a body part (tremor). °· Confusion. °· Muscle weakness. °· Sensitivity to light, sound, and touch. °· Psychiatric issues, such as depression, irritability, or psychosis. °· Sudden tightening of muscles (muscle spasms). °· Tingling in the arms and legs. °· A feeling of fluttering of the heart. °These symptoms are more severe if magnesium levels drop suddenly. °DIAGNOSIS °To make a diagnosis, your health care provider will do a physical exam and order blood and urine tests. °TREATMENT °Treatment will depend on the cause and the severity of your condition. It may involve: °· A magnesium supplement. This can be taken in pill form. It can also be given through an IV tube. This is usually done if the condition is severe. °· Changes to your diet. You may be directed to eat foods that have a lot of magnesium, such as green leafy vegetables, peas, beans, and nuts. °· Eliminating alcohol from your diet. °HOME CARE INSTRUCTIONS °· Include foods with magnesium in your diet. Foods that are rich in magnesium include green vegetables, beans, nuts and seeds, and whole grains. °· Take medicines only as directed by your health care provider. °· Take magnesium supplements if your health care provider instructs you to  do that. Take them as directed. °· Have your magnesium levels monitored as directed by your health care provider. °· When you are active, drink fluids that contain electrolytes. °· Keep all follow-up visits as directed by your health care provider. This is important. °SEEK MEDICAL CARE IF: °· You get worse instead of better. °· Your symptoms return. °SEEK IMMEDIATE MEDICAL CARE IF: °· Your symptoms are severe. °  °This information is not intended to replace advice given to you by your health care provider. Make sure you discuss any questions you have with your health care provider. °  °Document Released: 07/10/2005 Document Revised: 11/04/2014 Document Reviewed: 05/30/2014 °Elsevier Interactive Patient Education ©2016 [employer] ° °

## 2016-07-22 ENCOUNTER — Ambulatory Visit (HOSPITAL_BASED_OUTPATIENT_CLINIC_OR_DEPARTMENT_OTHER): Payer: PPO | Admitting: Hematology

## 2016-07-22 ENCOUNTER — Other Ambulatory Visit (HOSPITAL_BASED_OUTPATIENT_CLINIC_OR_DEPARTMENT_OTHER): Payer: PPO

## 2016-07-22 ENCOUNTER — Ambulatory Visit (HOSPITAL_BASED_OUTPATIENT_CLINIC_OR_DEPARTMENT_OTHER): Payer: PPO

## 2016-07-22 ENCOUNTER — Encounter: Payer: Self-pay | Admitting: Hematology

## 2016-07-22 VITALS — BP 141/58 | HR 69 | Temp 98.6°F | Resp 18 | Ht 65.0 in | Wt 144.5 lb

## 2016-07-22 DIAGNOSIS — C786 Secondary malignant neoplasm of retroperitoneum and peritoneum: Secondary | ICD-10-CM | POA: Diagnosis not present

## 2016-07-22 DIAGNOSIS — Z5112 Encounter for antineoplastic immunotherapy: Secondary | ICD-10-CM

## 2016-07-22 DIAGNOSIS — D509 Iron deficiency anemia, unspecified: Secondary | ICD-10-CM

## 2016-07-22 DIAGNOSIS — D6481 Anemia due to antineoplastic chemotherapy: Secondary | ICD-10-CM

## 2016-07-22 DIAGNOSIS — C787 Secondary malignant neoplasm of liver and intrahepatic bile duct: Secondary | ICD-10-CM

## 2016-07-22 DIAGNOSIS — E46 Unspecified protein-calorie malnutrition: Secondary | ICD-10-CM

## 2016-07-22 DIAGNOSIS — C7800 Secondary malignant neoplasm of unspecified lung: Secondary | ICD-10-CM | POA: Diagnosis not present

## 2016-07-22 DIAGNOSIS — C187 Malignant neoplasm of sigmoid colon: Secondary | ICD-10-CM

## 2016-07-22 DIAGNOSIS — C7972 Secondary malignant neoplasm of left adrenal gland: Secondary | ICD-10-CM

## 2016-07-22 DIAGNOSIS — I5042 Chronic combined systolic (congestive) and diastolic (congestive) heart failure: Secondary | ICD-10-CM

## 2016-07-22 DIAGNOSIS — E119 Type 2 diabetes mellitus without complications: Secondary | ICD-10-CM

## 2016-07-22 DIAGNOSIS — Z5111 Encounter for antineoplastic chemotherapy: Secondary | ICD-10-CM

## 2016-07-22 DIAGNOSIS — I10 Essential (primary) hypertension: Secondary | ICD-10-CM

## 2016-07-22 LAB — COMPREHENSIVE METABOLIC PANEL
ALT: 19 U/L (ref 0–55)
ANION GAP: 10 meq/L (ref 3–11)
AST: 23 U/L (ref 5–34)
Albumin: 3.1 g/dL — ABNORMAL LOW (ref 3.5–5.0)
Alkaline Phosphatase: 187 U/L — ABNORMAL HIGH (ref 40–150)
BILIRUBIN TOTAL: 0.57 mg/dL (ref 0.20–1.20)
BUN: 5.9 mg/dL — AB (ref 7.0–26.0)
CHLORIDE: 110 meq/L — AB (ref 98–109)
CO2: 23 meq/L (ref 22–29)
CREATININE: 0.8 mg/dL (ref 0.7–1.3)
Calcium: 8.2 mg/dL — ABNORMAL LOW (ref 8.4–10.4)
EGFR: >90 mL/min/{1.73_m2} (ref >90)
GLUCOSE: 180 mg/dL — AB (ref 70–140)
Potassium: 3.7 mEq/L (ref 3.5–5.1)
SODIUM: 143 meq/L (ref 136–145)
Total Protein: 6.9 g/dL (ref 6.4–8.3)

## 2016-07-22 LAB — CBC WITH DIFFERENTIAL/PLATELET
BASO%: 0.8 % (ref 0.0–2.0)
Basophils Absolute: 0 10*3/uL (ref 0.0–0.1)
EOS%: 3.6 % (ref 0.0–7.0)
Eosinophils Absolute: 0.1 10*3/uL (ref 0.0–0.5)
HCT: 27.3 % — ABNORMAL LOW (ref 38.4–49.9)
HGB: 8.9 g/dL — ABNORMAL LOW (ref 13.0–17.1)
LYMPH%: 14.2 % (ref 14.0–49.0)
MCH: 27.9 pg (ref 27.2–33.4)
MCHC: 32.5 g/dL (ref 32.0–36.0)
MCV: 85.9 fL (ref 79.3–98.0)
MONO#: 0.5 10*3/uL (ref 0.1–0.9)
MONO%: 13.4 % (ref 0.0–14.0)
NEUT%: 68 % (ref 39.0–75.0)
NEUTROS ABS: 2.5 10*3/uL (ref 1.5–6.5)
Platelets: 146 10*3/uL (ref 140–400)
RBC: 3.18 10*6/uL — AB (ref 4.20–5.82)
RDW: 19.5 % — ABNORMAL HIGH (ref 11.0–14.6)
WBC: 3.6 10*3/uL — AB (ref 4.0–10.3)
lymph#: 0.5 10*3/uL — ABNORMAL LOW (ref 0.9–3.3)

## 2016-07-22 LAB — MAGNESIUM: MAGNESIUM: 0.8 mg/dL — AB (ref 1.5–2.5)

## 2016-07-22 MED ORDER — ATROPINE SULFATE 1 MG/ML IJ SOLN
0.5000 mg | Freq: Once | INTRAMUSCULAR | Status: AC | PRN
  Administered 2016-07-22: 0.5 mg via INTRAVENOUS

## 2016-07-22 MED ORDER — SODIUM CHLORIDE 0.9 % IV SOLN
140.0000 mg/m2 | Freq: Once | INTRAVENOUS | Status: AC
  Administered 2016-07-22: 240 mg via INTRAVENOUS
  Filled 2016-07-22: qty 10

## 2016-07-22 MED ORDER — FLUOROURACIL CHEMO INJECTION 5 GM/100ML
1800.0000 mg/m2 | INTRAVENOUS | Status: AC
  Administered 2016-07-22: 3050 mg via INTRAVENOUS
  Filled 2016-07-22: qty 61

## 2016-07-22 MED ORDER — ATROPINE SULFATE 1 MG/ML IJ SOLN
INTRAMUSCULAR | Status: AC
  Filled 2016-07-22: qty 1

## 2016-07-22 MED ORDER — SODIUM CHLORIDE 0.9 % IV SOLN
Freq: Once | INTRAVENOUS | Status: AC
  Administered 2016-07-22: 11:00:00 via INTRAVENOUS

## 2016-07-22 MED ORDER — IRINOTECAN HCL CHEMO INJECTION 100 MG/5ML: 140.0000 mg/m2 | Freq: Once | INTRAVENOUS | Status: DC

## 2016-07-22 MED ORDER — SODIUM CHLORIDE 0.9 % IV SOLN
Freq: Once | INTRAVENOUS | Status: AC
  Administered 2016-07-22: 11:00:00 via INTRAVENOUS
  Filled 2016-07-22: qty 4

## 2016-07-22 MED ORDER — SODIUM CHLORIDE 0.9 % IV SOLN
400.0000 mg | Freq: Once | INTRAVENOUS | Status: AC
  Administered 2016-07-22: 400 mg via INTRAVENOUS
  Filled 2016-07-22: qty 20

## 2016-07-22 NOTE — Patient Instructions (Signed)
[facility] Discharge Instructions for Patients Receiving Chemotherapy  Today you received the following chemotherapy agents: Irinotecan, Vectibix, Adrucil   To help prevent nausea and vomiting after your treatment, we encourage you to take your nausea medication as directed.   If you develop nausea and vomiting that is not controlled by your nausea medication, call the clinic.   BELOW ARE SYMPTOMS THAT SHOULD BE REPORTED IMMEDIATELY:  *FEVER GREATER THAN 100.5 F  *CHILLS WITH OR WITHOUT FEVER  NAUSEA AND VOMITING THAT IS NOT CONTROLLED WITH YOUR NAUSEA MEDICATION  *UNUSUAL SHORTNESS OF BREATH  *UNUSUAL BRUISING OR BLEEDING  TENDERNESS IN MOUTH AND THROAT WITH OR WITHOUT PRESENCE OF ULCERS  *URINARY PROBLEMS  *BOWEL PROBLEMS  UNUSUAL RASH Items with * indicate a potential emergency and should be followed up as soon as possible.  Feel free to call the clinic you have any questions or concerns. The clinic phone number is [phone].  Please show the CHEMO ALERT CARD at check-in to the Emergency Department and triage nurse.   

## 2016-07-22 NOTE — Addendum Note (Signed)
Addended by: Truitt Merle on: 07/22/2016 11:12 AM   Modules accepted: Orders

## 2016-07-22 NOTE — Progress Notes (Signed)
[place]  Telephone:(336) [phone] Fax:(336) [phone]  Clinic follow up Note   Patient Care Team: [name], MD as PCP - General (Specialist) [name], CRNP as Nurse Practitioner (Nurse Practitioner) Truitt Merle, MD as Consulting Physician (Hematology) Ladene Artist, MD as Consulting Physician (Gastroenterology) 07/22/2016  CHIEF COMPLAINTS Follow up metastatic sigmoid colon cancer  Oncology History   He is a Metastatic colon cancer to liver   Staging form: Colon and Rectum, AJCC 7th Edition     Clinical: Stage Unknown ([place], NX, M1) - Unsigned      Cancer of sigmoid colon ([place])   10/28/2014 Tumor Marker    AFP 3.2 CEA > 10,000 CA 19-9 12,929.6. tumor (-) KRAS and NRAS mutation.       11/10/2014 Imaging    PET scan showed hypermetabolic mass in the sigmoid colon was noted metastasis in the retroperitoneum. Probable left adrenal and pulmonary metastasis, and diffuse liver metastasis.      11/21/2014 Initial Diagnosis    Metastatic colon cancer to liver, lung, abd nodes and left adrenal gland. Diagnosis was made by liver biopsy.       11/30/2014 - 05/23/2015 Chemotherapy    First line chemo [id number], Panitumumab added from second cycle, chemo held due to recurrent GI bleeding        12/28/2014 - 12/31/2014 Hospital Admission    Was admitted for dehydration, neutropenia fever with UTI, and severe skin rash.      02/08/2015 - 02/12/2015 Hospital Admission    He was admitted for upper GI bleeding, e.g. showed a gastric ulcer with clots, status post appendectomy injection. He also received a blood transfusion.      03/01/2015 Tumor Marker    CEA 694, CA19.9 462      03/13/2015 Imaging    CT CAP showed partial response, no new lesions.       05/31/2015 - 06/02/2015 Hospital Admission    He was admitted to [facility] in [place] due to upper GI bleeding, EGD showed gastric and dudenal ulcers       06/27/2015 - 06/29/2015 Hospital Admission    he was admitted  for dairrhea and pancolitis, c-diff and stool cultures were negative, treated with antibiotics      07/12/2015 -  Chemotherapy    panitumumab 39m/kg, every 2 weeks      09/19/2015 -  Chemotherapy    Irinotecan 1836mm2 every 2 weeks, dose reduction due to diarrhea. 5-Fu added on 06/10/2016 due to slight disease progression       11/08/2015 - 11/10/2015 Hospital Admission    Pt was admitted for sepsis and hypotension, was found to have (+) influenza A and treated.       11/30/2015 Imaging     CT scans reviewed continued improvement in the liver metastasis, no other new lesions.      05/06/2016 Tumor Marker    CEA has gradually increased from 110 in March 2017 to 566 in July 2070      06/04/2016 Imaging    Restaging CT scan showed similar to mild increase in hepatic metastasis, mild enlargement of right adrenal nodule, primary similar pulmonary nodules. A left upper lobe nodule has resolved.        HISTORY OF PRESENTING ILLNESS:  Pa[place] [age].o. male is here because of abnormal CT findings, which is very suspicious for malignancy. [place] He came in with his son  and an interpreter.  He has been feeling fatigued since two month ago. He is still able to do all ADLs. He otherwise denies any pain, bloating or nausea.  He lost about 20lbs in 3 month. His appetite is lower than before, eats less, no change of his bowl habits.  She denied any hematochezia or melana. Per his son, he has had some personality changes daily, irritable, slightly confused some time.  He was evaluated by his primary care physician. Lab test reviewed hepatitis B infection, which he did not know before, and elevated alkaline phosphatase, his liver function and the rest of the liver function was not remarkable. [place] of abdomen was obtained on 07/22/2014, which showed diffusely abnormal liver with multiple echogenic lesions. CT of abdomen with and without contrast  was done on 08/26/2014, which reviewed here at a medically with multiple large partially calcified hepatic masses consistent with metastatic disease. Mild retroperitoneal adenopathy with the largest node measuring 1.6 cm. And nonspecific 1.4 cm left adrenal nodule was also noticed. His tumor marker showed CEA greater than 10,000, CA 19-9 12,929, AFP 3.2 (normal). He was referred to [facility] liver clinic and was evaluated by nurse practitioner [name]. Treatment for hepatitis B was not recommended based on his virus load.  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 He was evaluated by a cardiologist in 2014. He denies any significant dyspnea on exertion. No leg swollen.  CURRENT THERAPY:  1. panitumumab 16m/kg, every 2 weeks, started on 07/12/2015 2. Irinotecan 1868mm2 every 2 weeks added on 09/19/2015, dose decreased to 16078m2 from cycle 2, and further decreased to 125m75m from cycle 10 due to diarrhea and anorexia, changed to 140mg67mfrom cycle 13. 3. 5-FU 1800mg/1mver 46 hours added back from 06/10/2016 due to mild disease progression 4. He also receives IV magnesium 6g weekly   INTERIM HISTORY: Ansar rShariffns for follow-up and chemotherapy. He is accompanied by his son to clinic today. He is doing well overall, has good appetite and energy level, functions well at home. He denies any significant pain, abdominal discomfort, nausea, diarrhea or hematochezia. He states his bowel movements normal. He still has skin rash mainly on his face, no significant pain or itchiness. He has been using creams. His weight is stable, no other complaints.  MEDICAL HISTORY:  Past Medical History:  Diagnosis Date  . Abnormal EKG   . Diabetes mellitus without complication (HCC)    metformin  . Gastric ulcer   . H. pylori infection   . Hepatitis B   . Hypertension   . Metastatic colon cancer to liver (HCC)  [place]  [place]. Tachycardia     SURGICAL HISTORY: Past Surgical History:  Procedure Laterality Date  . ESOPHAGOGASTRODUODENOSCOPY N/A 02/08/2015   Procedure: ESOPHAGOGASTRODUODENOSCOPY (EGD);  Surgeon: MalcolLadene Artist [facility];  Service: Endoscopy;  Laterality: N/A;  . ESOPHAGOGASTRODUODENOSCOPY (EGD) WITH PROPOFOL N/A 08/15/2015   Procedure: ESOPHAGOGASTRODUODENOSCOPY (EGD) WITH PROPOFOL;  Surgeon: MalcolLadene Artist Location: [facility];  Service: Endoscopy;  Laterality: N/A;  . LEFT AND RIGHT HEART CATHETERIZATION WITH CORONARY ANGIOGRAM N/A 03/29/2013   Procedure: LEFT AND RIGHT HEART CATHETERIZATION WITH CORONARY ANGIOGRAM;  Surgeon: [place] [facility];  Service: Cardiovascular;  Laterality: N/A;  . Nuclear Stress Test  03/03/2013   High risk - consistent with nonischemic cardiomyopathy    SOCIAL HISTORY: Social History   Social  History  . Marital status: Married    Spouse name: N/A  . Number of children: N/A  . Years of education: N/A   Occupational History  . Not on file.   Social History Main Topics  . Smoking status: Former Smoker    Types: Cigarettes    Quit date: 11/07/2008  . Smokeless tobacco: Never Used  . Alcohol use Yes     Comment: occasional  . Drug use: No  . Sexual activity: No   Other Topics Concern  . Not on file   Social History Narrative   Married   Enjoys walking   Has lived in [place] > 16 years    FAMILY HISTORY: No family history of liver disease or malignancy.  ALLERGIES:  has No Known Allergies.  MEDICATIONS:  Current Outpatient Prescriptions on File Prior to Visit  Medication Sig Dispense Refill  . clindamycin (CLINDAGEL) 1 % gel Apply topically 2 (two) times daily. 30 g 2  . diphenoxylate-atropine (LOMOTIL) 2.5-0.025 MG tablet Take 1-2 tablets by mouth 4 (four) times daily as needed for diarrhea or loose stools. 30 tablet 0  . folic acid (FOLVITE) 1 MG tablet Take 1 tablet (1 mg total) by mouth daily.  30 tablet 3  . hydrocortisone 1 % ointment Apply 1 application topically 2 (two) times daily. 56 g 0  . lidocaine-prilocaine (EMLA) cream Apply 1 application topically as needed. Apply to [facility] cath at least one hour before needle stick. 30 g 2  . lisinopril-hydrochlorothiazide (PRINZIDE,ZESTORETIC) 10-12.5 MG tablet     . loperamide (IMODIUM) 2 MG capsule Take 2 capsules (4 mg total) by mouth 4 (four) times daily as needed for diarrhea or loose stools (NO MORE THAN 8 TABLETS PER DAY.). 60 capsule 3  . magic mouthwash SOLN Take 5 mLs by mouth 4 (four) times daily. 120 mL 1  . magnesium oxide (MAG-OX) 400 (241.3 Mg) MG tablet Take 1 tablet (400 mg total) by mouth 3 (three) times daily. 90 tablet 3  . metFORMIN (GLUCOPHAGE) 500 MG tablet Take 1 tablet (500 mg total) by mouth 2 (two) times daily with a meal. 60 tablet 0  . nadolol (CORGARD) 20 MG tablet Take 1 tablet (20 mg total) by mouth 2 (two) times daily. THIS IS A ONE TIME ORDER.  FUTURE REFILLS NEED TO BE DONE BY PRIMARY MD. 60 tablet 1  . ondansetron (ZOFRAN) 8 MG tablet Take 1 tablet (8 mg total) by mouth every 8 (eight) hours as needed for nausea or vomiting. 45 tablet 2  . pantoprazole (PROTONIX) 40 MG tablet Take 1 tablet (40 mg total) by mouth daily. 30 tablet 2  . prochlorperazine (COMPAZINE) 10 MG tablet Take 1 tablet (10 mg total) by mouth every 6 (six) hours as needed for nausea or vomiting. 30 tablet 2   Current Facility-Administered Medications on File Prior to Visit  Medication Dose Route Frequency Provider Last Rate Last Dose  . 0.9 %  sodium chloride infusion   Intravenous Once Truitt Merle, MD      . ferumoxytol [facility]) 510 mg in sodium chloride 0.9 % 100 mL IVPB  510 mg Intravenous Once Truitt Merle, MD      . sodium chloride 0.9 % injection 10 mL  10 mL Intracatheter PRN Truitt Merle, MD   10 mL at 10/03/15 1705  . sodium chloride 0.9 % injection 10 mL  10 mL Intravenous PRN Truitt Merle, MD   10 mL at 05/14/16 1127  ;   REVIEW OF  SYSTEMS:   Constitutional: Denies fevers, chills or abnormal night sweats, (+) fatigue  Eyes: Denies blurriness of vision, double vision or watery eyes Ears, nose, mouth, throat, and face: Denies mucositis or sore throat Respiratory: Denies cough, dyspnea or wheezes Cardiovascular: Denies palpitation, chest discomfort or lower extremity swelling Gastrointestinal: Denies nausea, heartburn or change in bowel habits Skin: Denies abnormal skin rashes Lymphatics: Denies new lymphadenopathy or easy bruising Neurological:Denies numbness, tingling or new weaknesses Behavioral/Psych: Mood is stable, no new changes, (+) insomnia All other systems were reviewed with the patient and are negative.  PHYSICAL EXAMINATION: BP (!) 141/58 (BP Location: Left Arm, Patient Position: Sitting)   Pulse 69   Temp 98.6 F (37 C) (Oral)   Resp 18   Ht '5\' 5"'  (1.651 m)   Wt 144 lb 8 oz (65.5 kg)   SpO2 100%   BMI 24.05 kg/m   ECOG PERFORMANCE STATUS: 1 Vital sign were taken at in the infusion room, within normal limits. GENERAL:alert, no distress and comfortable SKIN: (+) Dry skin,  no skin ulcer, (+) scatter skin rashes on his neck and upper chest, some are resolving with skin pigmentation. No discharge. EYES: normal, conjunctiva are pink and non-injected, sclera clear OROPHARYNX:no exudate, no erythema and lips, buccal mucosa, and tongue with mild discoloration at the tip.  NECK: supple, thyroid normal size, non-tender, without nodularity LYMPH:  no palpable lymphadenopathy in the cervical, axillary or inguinal LUNGS: clear to auscultation and percussion with normal breathing effort, no rales   HEART: regular rate & rhythm and no murmurs and no lower extremity edema ABDOMEN:abdomen soft, non-tender, no hepatomegaly, no splenomegaly and normal bowel sounds Musculoskeletal:no cyanosis of digits and no clubbing  PSYCH: alert & oriented x 3 with fluent speech NEURO: no focal motor/sensory  deficits  LABORATORY DATA:  I have reviewed the data as listed CBC Latest Ref Rng & Units 07/08/2016 06/25/2016 06/10/2016  WBC 4.0 - 10.3 10e3/uL 4.5 3.1(L) 2.9(L)  Hemoglobin 13.0 - 17.1 g/dL 8.6(L) 8.5(L) 8.1(L)  Hematocrit 38.4 - 49.9 % 26.3(L) 25.8(L) 25.3(L)  Platelets 140 - 400 10e3/uL 140 109(L) 130(L)    CMP Latest Ref Rng & Units 07/08/2016 06/25/2016 06/10/2016  Glucose 70 - 140 mg/dl 217(H) 241(H) 125  BUN 7.0 - 26.0 mg/dL 10.1 9.8 7.3  Creatinine 0.7 - 1.3 mg/dL 0.8 0.8 0.7  Sodium 136 - 145 mEq/L 141 142 139  Potassium 3.5 - 5.1 mEq/L 3.8 4.1 4.2  Chloride 101 - 111 mmol/L - - -  CO2 22 - 29 mEq/L '22 23 23  ' Calcium 8.4 - 10.4 mg/dL 7.8(L) 8.4 7.7(L)  Total Protein 6.4 - 8.3 g/dL 6.8 6.6 6.5  Total Bilirubin 0.20 - 1.20 mg/dL 0.48 0.65 0.47  Alkaline Phos 40 - 150 U/L 186(H) 176(H) 161(H)  AST 5 - 34 U/L '21 31 30  ' ALT 0 - 55 U/L '21 22 23    ' INITIAL tumor markers AFP 3.2 CEA > 10,000 CA 19-9 12,929.6  Results for JAVONTE, ELENES (MRN [id number]) as of 07/22/2016 06:14  Ref. Range 05/06/2016 13:23 06/10/2016 10:15 07/08/2016 10:05  CEA Latest Ref Range: 0.0 - 4.7 ng/mL 566.1 (H) 639.7 (H) 716.6 (H)  CEA ([facility]) Latest Ref Range: 0.00 - 5.00 ng/mL  780.47 (H) 834.19 (H)     Pathology report  Liver, needle/core biopsy - METASTATIC ADENOCARCINOMA, SEE COMMENT. Microscopic Comment The adenocarcinoma demonstrates the following immunophenotype: Cytokeratin 7 - negative expression. Cytokeratin 20 - strong diffuse expression. CD2 - strong diffuse expression. Overall the  morphology and immunophenotype are that of metastatic adenocarcinoma primary to colorectum. The recent nuclear medicine scan demonstrating sigmoid mass with associated liver masses is noted.  [facility] test result:    RADIOGRAPHIC STUDIES: I have personally reviewed the outside CT scan image with patient and his son.   CT chest, abdomen and pelvis with IV contrast oN  06/04/2016 IMPRESSION: 1.  Similar to mild increase in hepatic metastasis. 2. Primarily similar pulmonary nodules. A left upper lobe nodule has resolved. 3. Mild enlargement of a right adrenal nodule. 4. No new sites of metastatic disease identified. 5.  Coronary artery atherosclerosis. Aortic atherosclerosis. 6. 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 Recannulized paraumbilical vein is consistent with chronic portal venous hypertension.   ASSESSMENT & PLAN:  70 year old [place] male, with past history of hypertension and dilated nonischemic gammopathy with EF 25%, no clinical signs of heart failure, who was found to have hepatitis B infection lately, and multiple liver lesions on the CT scan. He has extremely high CEA and CA 19-9 levels. PET scan reviewed a hypermetabolic sigmoid colon mass, diffuse liver metastasis, probable lung and adrenal gland metastasis.  1. Metastatic sigmoid colon cancer, with diffuse liver, lungs, node and left adrenal gland metastases. KRAS/NRAS wild type, MSI-stable -Liver biopsy showed metastatic adenocarcinoma. His tumor were strongly positive for CK20 and CD2, consistent with primary colorectal primary. KRAS and NRAS mutations were not detected.  -Pt understands that this is incurable cancer, and he has very high disease burden and overall prognosis is poor. The treatment goal is palliative -I discussed his restaging CT from 06/04/2016 which showed overall stable disease, mild increase in hepatic metastasis. His tumor marker CEA has been trending up, corresponding to mild disease progression. -I have added 5-FU back, and continue irinotecan and panitumumab, he has been tolerating well -He is clinical doing well, today's lab is still pending, if adequate for treatment, we'll proceed chemo today   2. Grade 1-2 skin rashes, stable  -Secondary to panitumumab, stable overall  -continue hydrocortisone 2.5%, and clindamycin gel 1%  twice daily as needed  -He knows to  avoid sun exposure, and call me if it gets worse.  3. Gastric ulcer with significant GI bleeding in April and Aug 2016 -He is on PPI, continue once daily  -Repeat his EGD on 08/15/2015 showed near complete healing of his gastric ulcer -continue Nadolol 4m bid, per Dr. SFuller Plan- I encourage him to follow-up with Dr[facility] since he will resume 5-FU  4. Type 2 DM  -His glucose level has increased lately, I encouraged him to watch his diet, avoid any sweets, and monitor closely at home -I encourage him to follow-up with his primary care physician Dr. WJimmye [place]  5. HTN, Dilated nonischemic ischemia cardiomyopathy with EF 25% -He is clinically doing well without symptoms of CHF. However this is probably going to impact his chemotherapy.Will try to avoid cardiotoxic chemotherapy agent and avoid fluid overload during chemotherapy. -Continue follow-up with cardiology. -will hold on his lisinopril-HCTZ for now, his BP has been normal lately   6 Hepatitis B carrier, with mild portal hypertension  -Per liver clinic, no need for treatment. Follow-up with Dr. SSilvio Pate  7. Malnutrition -I encouraged him to eat more, and take supplements as needed. -improved, gained weight back after I reduce his chemotherapy dose  -follow up with Dietitian   8. Anemia secondary to GI bleeding, iron deficiency and chemo  -Repeat lab on 06/06/2015 showed ferritin 117, serum  iron 24, saturation 8%, which supports iron deficiency -He received IV Feraheme again in Aug 2016 after GI bleeding  -Repeat a ferritin was 273 on 10/03/2015, much improved  -he received iv feraheme again on 11/27/2015 and 12/04/2015, but anemia did not improve much. -His anemia has been getting worse lately, probably related to chemotherapy, we'll close monitor any signs of GI bleeding. -Blood transfusion if hemoglobin less than 8 or symptomatic anemia with hemoglobin 8-9 -Hemoglobin 8.6 today, not symptomatic, will  continue monitoring  11. hypomagnesemia  -secondary to panitumumab -he receives IV mag 6 g every week on Wednesdays -He is taking magnesium pill 2 tablets 3 times a day -follow up weekly    Plan -Lab results pending, if adequate for treatment, we'll proceed FOLFIRI and panitumumab today and continue every 2 weeks.  -magnesium infusion weekly on Wednesday  -flu shot today  -I'll see him back in 2 weeks, will order restaging CT on next visit to be done in late Oct   All questions were answered. The patient knows to call the clinic with any problems, questions or concerns.  I spent 25 minutes counseling the patient face to face. The total time spent in the appointment was 30 minutes and more than 50% was on counseling.     Truitt Merle, MD 07/22/16

## 2016-07-22 NOTE — Progress Notes (Unsigned)
Reviewed 07/22/16 labs with [name], Magnesium 0.8. Okay to treat per [name], pt to receive Magnesium infusion on Weds 07/24/16 with Pump d/c.   Irinotecan started at 1340 and ended at 1515.

## 2016-07-24 ENCOUNTER — Ambulatory Visit (HOSPITAL_BASED_OUTPATIENT_CLINIC_OR_DEPARTMENT_OTHER): Payer: PPO

## 2016-07-24 VITALS — BP 153/77 | HR 56 | Temp 97.7°F | Resp 18

## 2016-07-24 DIAGNOSIS — D509 Iron deficiency anemia, unspecified: Secondary | ICD-10-CM

## 2016-07-24 DIAGNOSIS — C187 Malignant neoplasm of sigmoid colon: Secondary | ICD-10-CM

## 2016-07-24 MED ORDER — HEPARIN SOD (PORK) LOCK FLUSH 100 UNIT/ML IV SOLN
500.0000 [IU] | Freq: Once | INTRAVENOUS | Status: AC | PRN
  Administered 2016-07-24: 500 [IU]
  Filled 2016-07-24: qty 5

## 2016-07-24 MED ORDER — SODIUM CHLORIDE 0.9 % IJ SOLN
10.0000 mL | INTRAMUSCULAR | Status: DC | PRN
  Administered 2016-07-24: 10 mL
  Filled 2016-07-24: qty 10

## 2016-07-24 MED ORDER — SODIUM CHLORIDE 0.9 % IV SOLN
Freq: Once | INTRAVENOUS | Status: AC
  Administered 2016-07-24: 14:00:00 via INTRAVENOUS
  Filled 2016-07-24: qty 250

## 2016-07-24 NOTE — Patient Instructions (Signed)
Hypomagnesemia °Hypomagnesemia is a condition in which the level of magnesium in the blood is low. Magnesium is a mineral that is found in many foods. It is used in many different processes in the body. Hypomagnesemia can affect every organ in the body. It can cause life-threatening problems. °CAUSES °Causes of hypomagnesemia include: °· Not getting enough magnesium in your diet. °· Malnutrition. °· Problems with absorbing magnesium from the intestines. °· Dehydration. °· Alcohol abuse. °· Vomiting. °· Severe diarrhea. °· Some medicines, including medicines that make you urinate more. °· Certain diseases, such as kidney disease, diabetes, and overactive thyroid. °SIGNS AND SYMPTOMS °· Involuntary shaking or trembling of a body part (tremor). °· Confusion. °· Muscle weakness. °· Sensitivity to light, sound, and touch. °· Psychiatric issues, such as depression, irritability, or psychosis. °· Sudden tightening of muscles (muscle spasms). °· Tingling in the arms and legs. °· A feeling of fluttering of the heart. °These symptoms are more severe if magnesium levels drop suddenly. °DIAGNOSIS °To make a diagnosis, your health care provider will do a physical exam and order blood and urine tests. °TREATMENT °Treatment will depend on the cause and the severity of your condition. It may involve: °· A magnesium supplement. This can be taken in pill form. It can also be given through an IV tube. This is usually done if the condition is severe. °· Changes to your diet. You may be directed to eat foods that have a lot of magnesium, such as green leafy vegetables, peas, beans, and nuts. °· Eliminating alcohol from your diet. °HOME CARE INSTRUCTIONS °· Include foods with magnesium in your diet. Foods that are rich in magnesium include green vegetables, beans, nuts and seeds, and whole grains. °· Take medicines only as directed by your health care provider. °· Take magnesium supplements if your health care provider instructs you to  do that. Take them as directed. °· Have your magnesium levels monitored as directed by your health care provider. °· When you are active, drink fluids that contain electrolytes. °· Keep all follow-up visits as directed by your health care provider. This is important. °SEEK MEDICAL CARE IF: °· You get worse instead of better. °· Your symptoms return. °SEEK IMMEDIATE MEDICAL CARE IF: °· Your symptoms are severe. °  °This information is not intended to replace advice given to you by your health care provider. Make sure you discuss any questions you have with your health care provider. °  °Document Released: 07/10/2005 Document Revised: 11/04/2014 Document Reviewed: 05/30/2014 °Elsevier Interactive Patient Education ©2016 [employer] ° °

## 2016-07-31 ENCOUNTER — Ambulatory Visit (HOSPITAL_BASED_OUTPATIENT_CLINIC_OR_DEPARTMENT_OTHER): Payer: PPO

## 2016-07-31 VITALS — BP 122/73 | HR 73 | Temp 99.2°F | Resp 17

## 2016-07-31 DIAGNOSIS — Z95828 Presence of other vascular implants and grafts: Secondary | ICD-10-CM

## 2016-07-31 DIAGNOSIS — D509 Iron deficiency anemia, unspecified: Secondary | ICD-10-CM

## 2016-07-31 MED ORDER — HEPARIN SOD (PORK) LOCK FLUSH 100 UNIT/ML IV SOLN
500.0000 [IU] | Freq: Once | INTRAVENOUS | Status: AC | PRN
  Administered 2016-07-31: 500 [IU] via INTRAVENOUS
  Filled 2016-07-31: qty 5

## 2016-07-31 MED ORDER — SODIUM CHLORIDE 0.9 % IV SOLN
Freq: Once | INTRAVENOUS | Status: AC
  Administered 2016-07-31: 10:00:00 via INTRAVENOUS
  Filled 2016-07-31: qty 250

## 2016-07-31 MED ORDER — SODIUM CHLORIDE 0.9 % IJ SOLN
10.0000 mL | INTRAMUSCULAR | Status: DC | PRN
  Administered 2016-07-31: 10 mL via INTRAVENOUS
  Filled 2016-07-31: qty 10

## 2016-07-31 NOTE — Patient Instructions (Signed)
Hypomagnesemia °Hypomagnesemia is a condition in which the level of magnesium in the blood is low. Magnesium is a mineral that is found in many foods. It is used in many different processes in the body. Hypomagnesemia can affect every organ in the body. It can cause life-threatening problems. °CAUSES °Causes of hypomagnesemia include: °· Not getting enough magnesium in your diet. °· Malnutrition. °· Problems with absorbing magnesium from the intestines. °· Dehydration. °· Alcohol abuse. °· Vomiting. °· Severe diarrhea. °· Some medicines, including medicines that make you urinate more. °· Certain diseases, such as kidney disease, diabetes, and overactive thyroid. °SIGNS AND SYMPTOMS °· Involuntary shaking or trembling of a body part (tremor). °· Confusion. °· Muscle weakness. °· Sensitivity to light, sound, and touch. °· Psychiatric issues, such as depression, irritability, or psychosis. °· Sudden tightening of muscles (muscle spasms). °· Tingling in the arms and legs. °· A feeling of fluttering of the heart. °These symptoms are more severe if magnesium levels drop suddenly. °DIAGNOSIS °To make a diagnosis, your health care provider will do a physical exam and order blood and urine tests. °TREATMENT °Treatment will depend on the cause and the severity of your condition. It may involve: °· A magnesium supplement. This can be taken in pill form. It can also be given through an IV tube. This is usually done if the condition is severe. °· Changes to your diet. You may be directed to eat foods that have a lot of magnesium, such as green leafy vegetables, peas, beans, and nuts. °· Eliminating alcohol from your diet. °HOME CARE INSTRUCTIONS °· Include foods with magnesium in your diet. Foods that are rich in magnesium include green vegetables, beans, nuts and seeds, and whole grains. °· Take medicines only as directed by your health care provider. °· Take magnesium supplements if your health care provider instructs you to  do that. Take them as directed. °· Have your magnesium levels monitored as directed by your health care provider. °· When you are active, drink fluids that contain electrolytes. °· Keep all follow-up visits as directed by your health care provider. This is important. °SEEK MEDICAL CARE IF: °· You get worse instead of better. °· Your symptoms return. °SEEK IMMEDIATE MEDICAL CARE IF: °· Your symptoms are severe. °  °This information is not intended to replace advice given to you by your health care provider. Make sure you discuss any questions you have with your health care provider. °  °Document Released: 07/10/2005 Document Revised: 11/04/2014 Document Reviewed: 05/30/2014 °Elsevier Interactive Patient Education ©2016 [employer] ° °

## 2016-08-05 ENCOUNTER — Telehealth: Payer: Self-pay | Admitting: Hematology

## 2016-08-05 ENCOUNTER — Encounter: Payer: Self-pay | Admitting: Hematology

## 2016-08-05 ENCOUNTER — Ambulatory Visit (HOSPITAL_BASED_OUTPATIENT_CLINIC_OR_DEPARTMENT_OTHER): Payer: PPO | Admitting: Hematology

## 2016-08-05 ENCOUNTER — Ambulatory Visit (HOSPITAL_BASED_OUTPATIENT_CLINIC_OR_DEPARTMENT_OTHER): Payer: PPO

## 2016-08-05 ENCOUNTER — Other Ambulatory Visit (HOSPITAL_BASED_OUTPATIENT_CLINIC_OR_DEPARTMENT_OTHER): Payer: PPO

## 2016-08-05 ENCOUNTER — Ambulatory Visit (HOSPITAL_BASED_OUTPATIENT_CLINIC_OR_DEPARTMENT_OTHER): Payer: PPO | Admitting: Nurse Practitioner

## 2016-08-05 VITALS — BP 104/86 | HR 69 | Temp 98.6°F | Resp 18 | Ht 65.0 in | Wt 144.3 lb

## 2016-08-05 DIAGNOSIS — Z5112 Encounter for antineoplastic immunotherapy: Secondary | ICD-10-CM | POA: Diagnosis not present

## 2016-08-05 DIAGNOSIS — I10 Essential (primary) hypertension: Secondary | ICD-10-CM

## 2016-08-05 DIAGNOSIS — D500 Iron deficiency anemia secondary to blood loss (chronic): Secondary | ICD-10-CM

## 2016-08-05 DIAGNOSIS — Z5111 Encounter for antineoplastic chemotherapy: Secondary | ICD-10-CM

## 2016-08-05 DIAGNOSIS — Z23 Encounter for immunization: Secondary | ICD-10-CM

## 2016-08-05 DIAGNOSIS — D509 Iron deficiency anemia, unspecified: Secondary | ICD-10-CM

## 2016-08-05 DIAGNOSIS — E46 Unspecified protein-calorie malnutrition: Secondary | ICD-10-CM

## 2016-08-05 DIAGNOSIS — L271 Localized skin eruption due to drugs and medicaments taken internally: Secondary | ICD-10-CM

## 2016-08-05 DIAGNOSIS — C7972 Secondary malignant neoplasm of left adrenal gland: Secondary | ICD-10-CM

## 2016-08-05 DIAGNOSIS — K254 Chronic or unspecified gastric ulcer with hemorrhage: Secondary | ICD-10-CM

## 2016-08-05 DIAGNOSIS — C187 Malignant neoplasm of sigmoid colon: Secondary | ICD-10-CM | POA: Diagnosis not present

## 2016-08-05 DIAGNOSIS — C787 Secondary malignant neoplasm of liver and intrahepatic bile duct: Secondary | ICD-10-CM | POA: Diagnosis not present

## 2016-08-05 DIAGNOSIS — D6481 Anemia due to antineoplastic chemotherapy: Secondary | ICD-10-CM | POA: Diagnosis not present

## 2016-08-05 DIAGNOSIS — E119 Type 2 diabetes mellitus without complications: Secondary | ICD-10-CM

## 2016-08-05 DIAGNOSIS — Z95828 Presence of other vascular implants and grafts: Secondary | ICD-10-CM

## 2016-08-05 DIAGNOSIS — I5042 Chronic combined systolic (congestive) and diastolic (congestive) heart failure: Secondary | ICD-10-CM

## 2016-08-05 DIAGNOSIS — Z452 Encounter for adjustment and management of vascular access device: Secondary | ICD-10-CM | POA: Diagnosis not present

## 2016-08-05 LAB — CBC WITH DIFFERENTIAL/PLATELET
BASO%: 0.7 % (ref 0.0–2.0)
BASOS ABS: 0 10*3/uL (ref 0.0–0.1)
EOS%: 4.4 % (ref 0.0–7.0)
Eosinophils Absolute: 0.1 10*3/uL (ref 0.0–0.5)
HCT: 28.4 % — ABNORMAL LOW (ref 38.4–49.9)
HGB: 9.1 g/dL — ABNORMAL LOW (ref 13.0–17.1)
LYMPH%: 16.9 % (ref 14.0–49.0)
MCH: 27.6 pg (ref 27.2–33.4)
MCHC: 32.2 g/dL (ref 32.0–36.0)
MCV: 85.7 fL (ref 79.3–98.0)
MONO#: 0.4 10*3/uL (ref 0.1–0.9)
MONO%: 13.5 % (ref 0.0–14.0)
NEUT#: 2 10*3/uL (ref 1.5–6.5)
NEUT%: 64.5 % (ref 39.0–75.0)
Platelets: 144 10*3/uL (ref 140–400)
RBC: 3.31 10*6/uL — ABNORMAL LOW (ref 4.20–5.82)
RDW: 18.9 % — ABNORMAL HIGH (ref 11.0–14.6)
WBC: 3.1 10*3/uL — ABNORMAL LOW (ref 4.0–10.3)
lymph#: 0.5 10*3/uL — ABNORMAL LOW (ref 0.9–3.3)

## 2016-08-05 LAB — COMPREHENSIVE METABOLIC PANEL
ALT: 16 U/L (ref 0–55)
AST: 23 U/L (ref 5–34)
Albumin: 3.3 g/dL — ABNORMAL LOW (ref 3.5–5.0)
Alkaline Phosphatase: 179 U/L — ABNORMAL HIGH (ref 40–150)
Anion Gap: 9 mEq/L (ref 3–11)
BUN: 5.8 mg/dL — AB (ref 7.0–26.0)
CALCIUM: 8.2 mg/dL — AB (ref 8.4–10.4)
CHLORIDE: 109 meq/L (ref 98–109)
CO2: 24 meq/L (ref 22–29)
CREATININE: 0.8 mg/dL (ref 0.7–1.3)
EGFR: >90 mL/min/{1.73_m2} (ref >90)
Glucose: 153 mg/dl — ABNORMAL HIGH (ref 70–140)
Potassium: 3.6 mEq/L (ref 3.5–5.1)
Sodium: 143 mEq/L (ref 136–145)
Total Bilirubin: 0.47 mg/dL (ref 0.20–1.20)
Total Protein: 7 g/dL (ref 6.4–8.3)

## 2016-08-05 LAB — IRON AND TIBC
%SAT: 19 % — ABNORMAL LOW (ref 20–55)
Iron: 37 ug/dL — ABNORMAL LOW (ref 42–163)
TIBC: 193 ug/dL — ABNORMAL LOW (ref 202–409)
UIBC: 156 ug/dL (ref 117–376)

## 2016-08-05 LAB — FERRITIN: Ferritin: 734 ng/ml — ABNORMAL HIGH (ref 22–316)

## 2016-08-05 LAB — MAGNESIUM: Magnesium: <0.7 mg/dl — CL (ref 1.5–2.5)

## 2016-08-05 LAB — CEA (IN HOUSE-CHCC)

## 2016-08-05 MED ORDER — SODIUM CHLORIDE 0.9 % IJ SOLN
10.0000 mL | INTRAMUSCULAR | Status: DC | PRN
  Administered 2016-08-05: 10 mL via INTRAVENOUS
  Filled 2016-08-05: qty 10

## 2016-08-05 MED ORDER — INFLUENZA VAC SPLIT QUAD 0.5 ML IM SUSY
0.5000 mL | PREFILLED_SYRINGE | Freq: Once | INTRAMUSCULAR | Status: AC
  Administered 2016-08-05: 0.5 mL via INTRAMUSCULAR
  Filled 2016-08-05: qty 0.5

## 2016-08-05 MED ORDER — SODIUM CHLORIDE 0.9 % IV SOLN
1800.0000 mg/m2 | INTRAVENOUS | Status: DC
  Administered 2016-08-05: 3050 mg via INTRAVENOUS
  Filled 2016-08-05: qty 61

## 2016-08-05 MED ORDER — MAGNESIUM SULFATE 50 % IJ SOLN: Freq: Once | INTRAMUSCULAR | Status: DC

## 2016-08-05 MED ORDER — ATROPINE SULFATE 1 MG/ML IJ SOLN
INTRAMUSCULAR | Status: AC
  Filled 2016-08-05: qty 1

## 2016-08-05 MED ORDER — SODIUM CHLORIDE 0.9 % IV SOLN
6.3000 mg/kg | Freq: Once | INTRAVENOUS | Status: AC
  Administered 2016-08-05: 400 mg via INTRAVENOUS
  Filled 2016-08-05: qty 20

## 2016-08-05 MED ORDER — ATROPINE SULFATE 1 MG/ML IJ SOLN
0.5000 mg | Freq: Once | INTRAMUSCULAR | Status: AC | PRN
  Administered 2016-08-05: 0.5 mg via INTRAVENOUS

## 2016-08-05 MED ORDER — SODIUM CHLORIDE 0.9 % IV SOLN
Freq: Once | INTRAVENOUS | Status: AC
  Administered 2016-08-05: 11:00:00 via INTRAVENOUS

## 2016-08-05 MED ORDER — SODIUM CHLORIDE 0.9 % IV SOLN
Freq: Once | INTRAVENOUS | Status: AC
  Administered 2016-08-05: 11:00:00 via INTRAVENOUS
  Filled 2016-08-05: qty 4

## 2016-08-05 MED ORDER — IRINOTECAN HCL CHEMO INJECTION 100 MG/5ML
140.0000 mg/m2 | Freq: Once | INTRAVENOUS | Status: AC
  Administered 2016-08-05: 240 mg via INTRAVENOUS
  Filled 2016-08-05: qty 2

## 2016-08-05 NOTE — Patient Instructions (Signed)

## 2016-08-05 NOTE — Patient Instructions (Signed)
[place] Discharge Instructions for Patients Receiving Chemotherapy  Today you received the following chemotherapy agents Camptosar/Vectibix/Adrucil  To help prevent nausea and vomiting after your treatment, we encourage you to take your nausea medication    If you develop nausea and vomiting that is not controlled by your nausea medication, call the clinic.   BELOW ARE SYMPTOMS THAT SHOULD BE REPORTED IMMEDIATELY:  *FEVER GREATER THAN 100.5 F  *CHILLS WITH OR WITHOUT FEVER  NAUSEA AND VOMITING THAT IS NOT CONTROLLED WITH YOUR NAUSEA MEDICATION  *UNUSUAL SHORTNESS OF BREATH  *UNUSUAL BRUISING OR BLEEDING  TENDERNESS IN MOUTH AND THROAT WITH OR WITHOUT PRESENCE OF ULCERS  *URINARY PROBLEMS  *BOWEL PROBLEMS  UNUSUAL RASH Items with * indicate a potential emergency and should be followed up as soon as possible.  Feel free to call the clinic you have any questions or concerns. The clinic phone number is [phone].  Please show the [place] at check-in to the Emergency Department and triage nurse.

## 2016-08-05 NOTE — Progress Notes (Signed)
[place]  [phone] [phone]) [phone]  Clinic follow up Note   Patient Care Team: Jason Junior, MD as PCP - General (Specialist) Jason Reilly, CRNP as Nurse Practitioner (Nurse Practitioner) Jason Merle, MD as Consulting Physician (Hematology) Jason Artist, MD as Consulting Physician (Gastroenterology) 08/05/2016  CHIEF COMPLAINTS Follow up metastatic sigmoid colon cancer  Oncology History   He is a Metastatic colon cancer to liver   Staging form: Colon and Rectum, AJCC 7th Edition     Clinical: Stage Unknown ([place], NX, M1) - Unsigned      Cancer of sigmoid colon ([place])   10/28/2014 Tumor Marker    AFP 3.2 CEA > 10,000 CA 19-9 12,929.6. tumor (-) KRAS and NRAS mutation.       11/10/2014 Imaging    PET scan showed hypermetabolic mass in the sigmoid colon was noted metastasis in the retroperitoneum. Probable left adrenal and pulmonary metastasis, and diffuse liver metastasis.      11/21/2014 Initial Diagnosis    Metastatic colon cancer to liver, lung, abd nodes and left adrenal gland. Diagnosis was made by liver biopsy.       11/30/2014 - 05/23/2015 Chemotherapy    First line chemo [id number], Panitumumab added from second cycle, chemo held due to recurrent GI bleeding        12/28/2014 - 12/31/2014 Hospital Admission    Was admitted for dehydration, neutropenia fever with UTI, and severe skin rash.      02/08/2015 - 02/12/2015 Hospital Admission    He was admitted for upper GI bleeding, e.g. showed a gastric ulcer with clots, status post appendectomy injection. He also received a blood transfusion.      03/01/2015 Tumor Marker    CEA 694, CA19.9 462      03/13/2015 Imaging    CT CAP showed partial response, no new lesions.       05/31/2015 - 06/02/2015 Hospital Admission    He was admitted to [facility] in [place] due to upper GI bleeding, EGD showed gastric and dudenal ulcers       06/27/2015 - 06/29/2015 Hospital Admission    he was admitted  for dairrhea and pancolitis, c-diff and stool cultures were negative, treated with antibiotics      07/12/2015 -  Chemotherapy    panitumumab 53m/kg, every 2 weeks      09/19/2015 -  Chemotherapy    Irinotecan 1872mm2 every 2 weeks, dose reduction due to diarrhea. 5-Fu added on 06/10/2016 due to slight disease progression       11/08/2015 - 11/10/2015 Hospital Admission    Pt was admitted for sepsis and hypotension, was found to have (+) influenza A and treated.       11/30/2015 Imaging     CT scans reviewed continued improvement in the liver metastasis, no other new lesions.      05/06/2016 Tumor Marker    CEA has gradually increased from 110 in March 2017 to 566 in July 2070      06/04/2016 Imaging    Restaging CT scan showed similar to mild increase in hepatic metastasis, mild enlargement of right adrenal nodule, primary similar pulmonary nodules. A left upper lobe nodule has resolved.        HISTORY OF PRESENTING ILLNESS:  [name] 89.o. male is here because of abnormal CT findings, which is very suspicious for malignancy. [place] He came in with his son  and an interpreter.  He has been feeling fatigued since two month ago. He is still able to do all ADLs. He otherwise denies any pain, bloating or nausea.  He lost about 20lbs in 3 month. His appetite is lower than before, eats less, no change of his bowl habits.  She denied any hematochezia or melana. Per his son, he has had some personality changes daily, irritable, slightly confused some time.  He was evaluated by his primary care physician. Lab test reviewed hepatitis B infection, which he did not know before, and elevated alkaline phosphatase, his liver function and the rest of the liver function was not remarkable. [place] of abdomen was obtained on 07/22/2014, which showed diffusely abnormal liver with multiple echogenic lesions. CT of abdomen with and without contrast  was done on 08/26/2014, which reviewed here at a medically with multiple large partially calcified hepatic masses consistent with metastatic disease. Mild retroperitoneal adenopathy with the largest node measuring 1.6 cm. And nonspecific 1.4 cm left adrenal nodule was also noticed. His tumor marker showed CEA greater than 10,000, CA 19-9 12,929, AFP 3.2 (normal). He was referred to [facility] and was evaluated by nurse practitioner Jason Reilly. Treatment for hepatitis B was not recommended based on his virus load.  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 He was evaluated by a cardiologist in 2014. He denies any significant dyspnea on exertion. No leg swollen.  CURRENT THERAPY:  1. panitumumab 58m/kg, every 2 weeks, started on 07/12/2015 2. Irinotecan 1860mm2 every 2 weeks added on 09/19/2015, dose decreased to 16032m2 from cycle 2, and further decreased to 125m69m from cycle 10 due to diarrhea and anorexia, changed to 140mg33mfrom cycle 13. 3. 5-FU 1800mg/33mver 46 hours added back from 06/10/2016 due to mild disease progression 4. He also receives IV magnesium 6g weekly   INTERIM HISTORY: Jason rKironns for follow-up and chemotherapy. He is accompanied by his son to clinic today. He is doing well overall, he has mild fatigue for 1-2 days after chemotherapy, but recovers very well. He denies any significant pain, or other complaints. He still has moderate rash on his face, he picks sometime. No significant nausea, diarrhea, or other symptoms. He has good appetite and good energy level, function well at home. His weight has been stable.  MEDICAL HISTORY:  Past Medical History:  Diagnosis Date  . Abnormal EKG   . Diabetes mellitus without complication (HCC)    metformin  . Gastric ulcer   . H. pylori infection   . Hepatitis B   . Hypertension   . Metastatic colon cancer to liver (HCC)  [place]montNon-ischemic cardiomyopathy  (HCC)  BenedictTachycardia     SURGICAL HISTORY: Past Surgical History:  Procedure Laterality Date  . ESOPHAGOGASTRODUODENOSCOPY N/A 02/08/2015   Procedure: ESOPHAGOGASTRODUODENOSCOPY (EGD);  Surgeon: Jason Reilly Reilly [facility];  Service: Endoscopy;  Laterality: N/A;  . ESOPHAGOGASTRODUODENOSCOPY (EGD) WITH PROPOFOL N/A 08/15/2015   Procedure: ESOPHAGOGASTRODUODENOSCOPY (EGD) WITH PROPOFOL;  Surgeon: Jason Reilly Reilly Location: [facility];  Service: Endoscopy;  Laterality: N/A;  . LEFT AND RIGHT HEART CATHETERIZATION WITH CORONARY ANGIOGRAM N/A 03/29/2013   Procedure: LEFT AND RIGHT HEART CATHETERIZATION WITH CORONARY ANGIOGRAM;  Surgeon: [place] [facility];  Service: Cardiovascular;  Laterality: N/A;  . Nuclear Stress Test  03/03/2013   High risk - consistent with nonischemic cardiomyopathy    SOCIAL HISTORY: Social History  Social History  . Marital status: Married    Spouse name: N/A  . Number of children: N/A  . Years of education: N/A   Occupational History  . Not on file.   Social History Main Topics  . Smoking status: Former Smoker    Types: Cigarettes    Quit date: 11/07/2008  . Smokeless tobacco: Never Used  . Alcohol use Yes     Comment: occasional  . Drug use: No  . Sexual activity: No   Other Topics Concern  . Not on file   Social History Narrative   Married   Enjoys walking   Has lived in [place] > 16 years    FAMILY HISTORY: No family history of liver disease or malignancy.  ALLERGIES:  has No Known Allergies.  MEDICATIONS:  Current Outpatient Prescriptions on File Prior to Visit  Medication Sig Dispense Refill  . clindamycin (CLINDAGEL) 1 % gel Apply topically 2 (two) times daily. 30 g 2  . diphenoxylate-atropine (LOMOTIL) 2.5-0.025 MG tablet Take 1-2 tablets by mouth 4 (four) times daily as needed for diarrhea or loose stools. 30 tablet 0  . folic acid (FOLVITE) 1 MG tablet Take 1 tablet (1 mg total) by mouth  daily. 30 tablet 3  . hydrocortisone 1 % ointment Apply 1 application topically 2 (two) times daily. 56 g 0  . lidocaine-prilocaine (EMLA) cream Apply 1 application topically as needed. Apply to [facility] cath at least one hour before needle stick. 30 g 2  . lisinopril-hydrochlorothiazide (PRINZIDE,ZESTORETIC) 10-12.5 MG tablet     . loperamide (IMODIUM) 2 MG capsule Take 2 capsules (4 mg total) by mouth 4 (four) times daily as needed for diarrhea or loose stools (NO MORE THAN 8 TABLETS PER DAY.). 60 capsule 3  . magic mouthwash SOLN Take 5 mLs by mouth 4 (four) times daily. 120 mL 1  . magnesium oxide (MAG-OX) 400 (241.3 Mg) MG tablet Take 1 tablet (400 mg total) by mouth 3 (three) times daily. 90 tablet 3  . metFORMIN (GLUCOPHAGE) 500 MG tablet Take 1 tablet (500 mg total) by mouth 2 (two) times daily with a meal. 60 tablet 0  . nadolol (CORGARD) 20 MG tablet Take 1 tablet (20 mg total) by mouth 2 (two) times daily. THIS IS A ONE TIME ORDER.  FUTURE REFILLS NEED TO BE DONE BY PRIMARY MD. 60 tablet 1  . ondansetron (ZOFRAN) 8 MG tablet Take 1 tablet (8 mg total) by mouth every 8 (eight) hours as needed for nausea or vomiting. 45 tablet 2  . pantoprazole (PROTONIX) 40 MG tablet Take 1 tablet (40 mg total) by mouth daily. 30 tablet 2  . prochlorperazine (COMPAZINE) 10 MG tablet Take 1 tablet (10 mg total) by mouth every 6 (six) hours as needed for nausea or vomiting. 30 tablet 2   Current Facility-Administered Medications on File Prior to Visit  Medication Dose Route Frequency Provider Last Rate Last Dose  . 0.9 %  sodium chloride infusion   Intravenous Once Jason Merle, MD      . ferumoxytol [facility]) 510 mg in sodium chloride 0.9 % 100 mL IVPB  510 mg Intravenous Once Jason Merle, MD      . sodium chloride 0.9 % injection 10 mL  10 mL Intracatheter PRN Jason Merle, MD   10 mL at 10/03/15 1705  . sodium chloride 0.9 % injection 10 mL  10 mL Intravenous PRN Jason Merle, MD   10 mL at 05/14/16 1127  ;   REVIEW  OF SYSTEMS:   Constitutional: Denies fevers, chills or abnormal night sweats, (+) fatigue  Eyes: Denies blurriness of vision, double vision or watery eyes Ears, nose, mouth, throat, and face: Denies mucositis or sore throat Respiratory: Denies cough, dyspnea or wheezes Cardiovascular: Denies palpitation, chest discomfort or lower extremity swelling Gastrointestinal: Denies nausea, heartburn or change in bowel habits Skin: Denies abnormal skin rashes Lymphatics: Denies new lymphadenopathy or easy bruising Neurological:Denies numbness, tingling or new weaknesses Behavioral/Psych: Mood is stable, no new changes, (+) insomnia All other systems were reviewed with the patient and are negative.  PHYSICAL EXAMINATION: BP 104/86 (BP Location: Right Arm, Patient Position: Sitting)   Pulse 69   Temp 98.6 F (37 C) (Oral)   Resp 18   Ht _0  (1.651 m)   Wt 144 lb 4.8 oz (65.5 kg)   SpO2 99%   BMI 24.01 kg/m   ECOG PERFORMANCE STATUS: 1 Vital sign were taken at in the infusion room, within normal limits. GENERAL:alert, no distress and comfortable SKIN: (+) Dry skin,  no skin ulcer, (+) scatter skin rashes on his neck and upper chest, some are resolving with skin pigmentation. No discharge. EYES: normal, conjunctiva are pink and non-injected, sclera clear OROPHARYNX:no exudate, no erythema and lips, buccal mucosa, and tongue with mild discoloration at the tip.  NECK: supple, thyroid normal size, non-tender, without nodularity LYMPH:  no palpable lymphadenopathy in the cervical, axillary or inguinal LUNGS: clear to auscultation and percussion with normal breathing effort, no rales   HEART: regular rate & rhythm and no murmurs and no lower extremity edema ABDOMEN:abdomen soft, non-tender, no hepatomegaly, no splenomegaly and normal bowel sounds Musculoskeletal:no cyanosis of digits and no clubbing  PSYCH: alert & oriented x 3 with fluent speech NEURO: no focal motor/sensory  deficits  LABORATORY DATA:  I have reviewed the data as listed CBC Latest Ref Rng & Units 08/05/2016 07/22/2016 07/08/2016  WBC 4.0 - 10.3 10e3/uL 3.1(L) 3.6(L) 4.5  Hemoglobin 13.0 - 17.1 g/dL 9.1(L) 8.9(L) 8.6(L)  Hematocrit 38.4 - 49.9 % 28.4(L) 27.3(L) 26.3(L)  Platelets 140 - 400 10e3/uL 144 146 140    CMP Latest Ref Rng & Units 08/05/2016 07/22/2016 07/08/2016  Glucose 70 - 140 mg/dl 153(H) 180(H) 217(H)  BUN 7.0 - 26.0 mg/dL 5.8(L) 5.9(L) 10.1  Creatinine 0.7 - 1.3 mg/dL 0.8 0.8 0.8  Sodium 136 - 145 mEq/L 143 143 141  Potassium 3.5 - 5.1 mEq/L 3.6 3.7 3.8  Chloride 101 - 111 mmol/L - - -  CO2 22 - 29 mEq/L _1 Calcium 8.4 - 10.4 mg/dL 8.2(L) 8.2(L) 7.8(L)  Total Protein 6.4 - 8.3 g/dL 7.0 6.9 6.8  Total Bilirubin 0.20 - 1.20 mg/dL 0.47 0.57 0.48  Alkaline Phos 40 - 150 U/L 179(H) 187(H) 186(H)  AST 5 - 34 U/L _2 ALT 0 - 55 U/L _3 INITIAL tumor markers AFP 3.2 CEA > 10,000 CA 19-9 12,929.6  Results for Jason Reilly, Jason Reilly (MRN [id number]) as of 07/22/2016 06:14  Ref. Range 05/06/2016 13:23 06/10/2016 10:15 07/08/2016 10:05  CEA Latest Ref Range: 0.0 - 4.7 ng/mL 566.1 (H) 639.7 (H) 716.6 (H)  CEA ([facility]) Latest Ref Range: 0.00 - 5.00 ng/mL  780.47 (H) 834.19 (H)     Pathology report  Liver, needle/core biopsy - METASTATIC ADENOCARCINOMA, SEE COMMENT. Microscopic Comment The adenocarcinoma demonstrates the following immunophenotype: Cytokeratin 7 - negative expression. Cytokeratin 20 - strong diffuse expression. CD2 - strong diffuse expression. Overall the  morphology and immunophenotype are that of metastatic adenocarcinoma primary to colorectum. The recent nuclear medicine scan demonstrating sigmoid mass with associated liver masses is noted.  [facility] test result:    RADIOGRAPHIC STUDIES: I have personally reviewed the outside CT scan image with patient and his son.   CT chest, abdomen and pelvis with IV contrast oN   06/04/2016 IMPRESSION: 1. Similar to mild increase in hepatic metastasis. 2. Primarily similar pulmonary nodules. A left upper lobe nodule has resolved. 3. Mild enlargement of a right adrenal nodule. 4. No new sites of metastatic disease identified. 5.  Coronary artery atherosclerosis. Aortic atherosclerosis. 6. 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 Recannulized paraumbilical vein is consistent with chronic portal venous hypertension.   ASSESSMENT & PLAN:  70 year old [place] male, with past history of hypertension and dilated nonischemic gammopathy with EF 25%, no clinical signs of heart failure, who was found to have hepatitis B infection lately, and multiple liver lesions on the CT scan. He has extremely high CEA and CA 19-9 levels. PET scan reviewed a hypermetabolic sigmoid colon mass, diffuse liver metastasis, probable lung and adrenal gland metastasis.  1. Metastatic sigmoid colon cancer, with diffuse liver, lungs, node and left adrenal gland metastases. KRAS/NRAS wild type, MSI-stable -Liver biopsy showed metastatic adenocarcinoma. His tumor were strongly positive for CK20 and CD2, consistent with primary colorectal primary. KRAS and NRAS mutations were not detected.  -Pt understands that this is incurable cancer, and he has very high disease burden and overall prognosis is poor. The treatment goal is palliative -I discussed his restaging CT from 06/04/2016 which showed overall stable disease, mild increase in hepatic metastasis. His tumor marker CEA has been trending up, corresponding to mild disease progression. -I have added 5-FU back, and continue irinotecan and panitumumab, he has been tolerating well -He is clinical doing well, lab results reviewed, adequate for treatment,  we'll proceed chemo today   2. Grade 1-2 skin rashes, stable  -Secondary to panitumumab, stable overall  -continue hydrocortisone 2.5%, and clindamycin gel 1%  twice daily as  needed  -He knows to avoid sun exposure, and call me if it gets worse.  3. Gastric ulcer with significant GI bleeding in April and Aug 2016 -He is on PPI, continue once daily  -Repeat his EGD on 08/15/2015 showed near complete healing of his gastric ulcer -continue Nadolol 32m bid, per Dr. SFuller Plan- I encourage him to follow-up with Dr. SFuller Plan[facility] closely since he will resume 5-FU  4. Type 2 DM  -His glucose level has increased lately, I encouraged him to watch his diet, avoid any sweets, and monitor closely at home -I encourage him to follow-up with his primary care physician Dr. WJimmye [place]  5. HTN, Dilated nonischemic ischemia cardiomyopathy with EF 25% -He is clinically doing well without symptoms of CHF. However this is probably going to impact his chemotherapy.Will try to avoid cardiotoxic chemotherapy agent and avoid fluid overload during chemotherapy. -Continue follow-up with cardiology. -will hold on his lisinopril-HCTZ for now, his BP has been normal lately   6 Hepatitis B carrier, with mild portal hypertension  -Per liver clinic, no need for treatment. Follow-up with Dr. SSilvio Pate  7. Malnutrition -I encouraged him to eat more, and take supplements as needed. -improved, gained weight back after I reduce his chemotherapy dose  -follow up with Dietitian   8. Anemia secondary to GI bleeding, iron deficiency and chemo  -Repeat lab on 06/06/2015 showed ferritin 117, serum iron 24,  saturation 8%, which supports iron deficiency -He received IV Feraheme again in Aug 2016 after GI bleeding  -Repeat a ferritin was 273 on 10/03/2015, much improved  -he received iv feraheme again on 11/27/2015 and 12/04/2015, but anemia did not improve much. -His anemia has been getting worse lately, probably related to chemotherapy, we'll close monitor any signs of GI bleeding. -Blood transfusion if hemoglobin less than 8 or symptomatic anemia with hemoglobin 8-9 -Hemoglobin 8.6 today, not  symptomatic, will continue monitoring  11. hypomagnesemia  -secondary to panitumumab -he receives IV mag 6 g every week on Wednesdays -He is taking magnesium pill 2 tablets 3 times a day -follow up weekly    Plan -Lab results reviewed, adequate for treatment, we'll proceed FOLFIRI and panitumumab today and continue every 2 weeks.  -magnesium infusion weekly on Wednesday  -flu shot today  -I'll see him back in 2 weeks, restaging CT next Wednesday   All questions were answered. The patient knows to call the clinic with any problems, questions or concerns.  I spent 15 minutes counseling the patient face to face. The total time spent in the appointment was 20 minutes and more than 50% was on counseling.     Jason Merle, MD 08/05/16

## 2016-08-05 NOTE — Telephone Encounter (Signed)
ADDED ADDITIONAL APPOINTMENTS FOR 10/18 LAB/FLUSH AND 10/23 AND 10/25. SCAN EXPECTED DATE 10/18 AND [facility] WILL CALL RE SCAN.  PATIENT SON TO GET NEW SCHEDULE AND PREP AT 10/11 VISIT.

## 2016-08-07 ENCOUNTER — Ambulatory Visit (HOSPITAL_BASED_OUTPATIENT_CLINIC_OR_DEPARTMENT_OTHER): Payer: PPO

## 2016-08-07 ENCOUNTER — Encounter: Payer: Self-pay | Admitting: Nurse Practitioner

## 2016-08-07 ENCOUNTER — Ambulatory Visit (HOSPITAL_BASED_OUTPATIENT_CLINIC_OR_DEPARTMENT_OTHER): Payer: PPO | Admitting: Nurse Practitioner

## 2016-08-07 VITALS — BP 114/52 | HR 58 | Temp 96.6°F | Resp 18

## 2016-08-07 DIAGNOSIS — D509 Iron deficiency anemia, unspecified: Secondary | ICD-10-CM

## 2016-08-07 DIAGNOSIS — Z95828 Presence of other vascular implants and grafts: Secondary | ICD-10-CM

## 2016-08-07 DIAGNOSIS — C187 Malignant neoplasm of sigmoid colon: Secondary | ICD-10-CM

## 2016-08-07 DIAGNOSIS — R109 Unspecified abdominal pain: Secondary | ICD-10-CM | POA: Insufficient documentation

## 2016-08-07 DIAGNOSIS — R1084 Generalized abdominal pain: Secondary | ICD-10-CM | POA: Diagnosis not present

## 2016-08-07 DIAGNOSIS — C787 Secondary malignant neoplasm of liver and intrahepatic bile duct: Secondary | ICD-10-CM

## 2016-08-07 MED ORDER — OXYCODONE-ACETAMINOPHEN 5-325 MG PO TABS
1.0000 | ORAL_TABLET | Freq: Once | ORAL | Status: AC
  Administered 2016-08-07: 1 via ORAL

## 2016-08-07 MED ORDER — OXYCODONE-ACETAMINOPHEN 5-325 MG PO TABS: 1.0000 | ORAL_TABLET | Freq: Once | ORAL | 0 refills | Status: DC

## 2016-08-07 MED ORDER — SODIUM CHLORIDE 0.9 % IJ SOLN
10.0000 mL | INTRAMUSCULAR | Status: DC | PRN
Start: 2016-08-07 — End: 2016-08-07
  Administered 2016-08-07: 10 mL
  Filled 2016-08-07: qty 10

## 2016-08-07 MED ORDER — SODIUM CHLORIDE 0.9 % IV SOLN
Freq: Once | INTRAVENOUS | Status: AC
  Administered 2016-08-07: 13:00:00 via INTRAVENOUS

## 2016-08-07 MED ORDER — HEPARIN SOD (PORK) LOCK FLUSH 100 UNIT/ML IV SOLN
500.0000 [IU] | Freq: Once | INTRAVENOUS | Status: AC | PRN
  Administered 2016-08-07: 500 [IU]
  Filled 2016-08-07: qty 5

## 2016-08-07 MED ORDER — OXYCODONE-ACETAMINOPHEN 5-325 MG PO TABS
ORAL_TABLET | ORAL | Status: AC
  Filled 2016-08-07: qty 1

## 2016-08-07 MED ORDER — SODIUM CHLORIDE 0.9 % IV SOLN
Freq: Once | INTRAVENOUS | Status: AC
  Administered 2016-08-07: 13:00:00 via INTRAVENOUS
  Filled 2016-08-07: qty 250

## 2016-08-07 MED ORDER — ALTEPLASE 2 MG IJ SOLR
2.0000 mg | Freq: Once | INTRAMUSCULAR | Status: DC | PRN
  Filled 2016-08-07: qty 2

## 2016-08-07 NOTE — Assessment & Plan Note (Signed)
Patient received cycle 23 of his Folfiri/panitumumab chemotherapy regimen on 08/05/2016.  He also continues to receive IV magnesium on a weekly basis.  He is scheduled to return on 08/14/2016 for labs, flush, and his next infusion.

## 2016-08-07 NOTE — Progress Notes (Signed)
SYMPTOM MANAGEMENT CLINIC    Chief Complaint: Abdominal cramping  HPI:  Jason Reilly 70 y.o. male diagnosed with colon cancer.  Currently undergoing Folfiri / panitumumab chemotherapy regimen.  Also receives weekly magnesium infusions as well.  Oncology History   He is a Metastatic colon cancer to liver   Staging form: Colon and Rectum, AJCC 7th Edition     Clinical: Stage Unknown (TX, NX, M1) - Unsigned      Cancer of sigmoid colon ([place])   10/28/2014 Tumor Marker    AFP 3.2 CEA > 10,000 CA 19-9 12,929.6. tumor (-) KRAS and NRAS mutation.       11/10/2014 Imaging    PET scan showed hypermetabolic mass in the sigmoid colon was noted metastasis in the retroperitoneum. Probable left adrenal and pulmonary metastasis, and diffuse liver metastasis.      11/21/2014 Initial Diagnosis    Metastatic colon cancer to liver, lung, abd nodes and left adrenal gland. Diagnosis was made by liver biopsy.       11/30/2014 - 05/23/2015 Chemotherapy    First line chemo [id number], Panitumumab added from second cycle, chemo held due to recurrent GI bleeding        12/28/2014 - 12/31/2014 Hospital Admission    Was admitted for dehydration, neutropenia fever with UTI, and severe skin rash.      02/08/2015 - 02/12/2015 Hospital Admission    He was admitted for upper GI bleeding, e.g. showed a gastric ulcer with clots, status post appendectomy injection. He also received a blood transfusion.      03/01/2015 Tumor Marker    CEA 694, CA19.9 462      03/13/2015 Imaging    CT CAP showed partial response, no new lesions.       05/31/2015 - 06/02/2015 Hospital Admission    He was admitted to [facility] in [place] due to upper GI bleeding, EGD showed gastric and dudenal ulcers       06/27/2015 - 06/29/2015 Hospital Admission    he was admitted for dairrhea and pancolitis, c-diff and stool cultures were negative, treated with antibiotics      07/12/2015 -  Chemotherapy    panitumumab 42m/kg, every 2 weeks      09/19/2015 -  Chemotherapy    Irinotecan 1833mm2 every 2 weeks, dose reduction due to diarrhea. 5-Fu added on 06/10/2016 due to slight disease progression       11/08/2015 - 11/10/2015 Hospital Admission    Pt was admitted for sepsis and hypotension, was found to have (+) influenza A and treated.       11/30/2015 Imaging     CT scans reviewed continued improvement in the liver metastasis, no other new lesions.      05/06/2016 Tumor Marker    CEA has gradually increased from 110 in March 2017 to 566 in July 2070      06/04/2016 Imaging    Restaging CT scan showed similar to mild increase in hepatic metastasis, mild enlargement of right adrenal nodule, primary similar pulmonary nodules. A left upper lobe nodule has resolved.       Review of Systems  Gastrointestinal: Positive for abdominal pain and diarrhea.  All other systems reviewed and are negative.   Past Medical History:  Diagnosis Date  . Abnormal EKG   . Diabetes mellitus without complication (HCC)    metformin  . Gastric ulcer   . H. pylori infection   . Hepatitis B   . Hypertension   . Metastatic colon cancer  to liver ([place])   . Non-ischemic cardiomyopathy ([place])   . Tachycardia     Past Surgical History:  Procedure Laterality Date  . ESOPHAGOGASTRODUODENOSCOPY N/A 02/08/2015   Procedure: ESOPHAGOGASTRODUODENOSCOPY (EGD);  Surgeon: Ladene Artist, MD;  Location: [facility];  Service: Endoscopy;  Laterality: N/A;  . ESOPHAGOGASTRODUODENOSCOPY (EGD) WITH PROPOFOL N/A 08/15/2015   Procedure: ESOPHAGOGASTRODUODENOSCOPY (EGD) WITH PROPOFOL;  Surgeon: Ladene Artist, MD;  Location: [facility];  Service: Endoscopy;  Laterality: N/A;  . LEFT AND RIGHT HEART CATHETERIZATION WITH CORONARY ANGIOGRAM N/A 03/29/2013   Procedure: LEFT AND RIGHT HEART CATHETERIZATION WITH CORONARY ANGIOGRAM;  Surgeon: Pixie Casino, MD;  Location: [facility] CATH LAB;  Service: Cardiovascular;  Laterality: N/A;  . Nuclear Stress Test  03/03/2013   High  risk - consistent with nonischemic cardiomyopathy    has Hypertension; Abnormal nuclear cardiac imaging test; Acute systolic heart failure ([place]); Non-ischemic cardiomyopathy ([place]); Cancer of sigmoid colon ([place]); Acute renal failure ([place]); Hyponatremia; Weakness; Acute blood loss anemia; Leukocytosis; Chronic combined systolic and diastolic heart failure ([place]); Arterial hypotension; Gastric ulcer with hemorrhage but without obstruction; Upper GI bleed; Absolute anemia; Nausea with vomiting; Dehydration; Hyperphosphatemia; Rash; Anemia, iron deficiency; Acute colitis; Duodenal ulcer without hemorrhage, perforation, or obstruction; Esophageal varices ([place]); Gastric ulcer with hemorrhage; Hypotension; Sepsis ([place]); Acute on chronic systolic (congestive) heart failure; Leukopenia due to antineoplastic chemotherapy ([place]); Influenza A; Diabetes mellitus, type 2 ([place]); Hypomagnesemia; Hypoalbuminemia due to protein-calorie malnutrition ([place]); Neoplastic malignant related fatigue; Diarrhea; Port catheter in place; and Abdominal pain on his problem list.    has No Known Allergies.    Medication List       Accurate as of 08/07/16  4:45 PM. Always use your most recent med list.          clindamycin 1 % gel Commonly known as:  CLINDAGEL Apply topically 2 (two) times daily.   diphenoxylate-atropine 2.5-0.025 MG tablet Commonly known as:  LOMOTIL Take 1-2 tablets by mouth 4 (four) times daily as needed for diarrhea or loose stools.   folic acid 1 MG tablet Commonly known as:  FOLVITE Take 1 tablet (1 mg total) by mouth daily.   hydrocortisone 1 % ointment Apply 1 application topically 2 (two) times daily.   lidocaine-prilocaine cream Commonly known as:  EMLA Apply 1 application topically as needed. Apply to [facility] cath at least one hour before needle stick.   lisinopril-hydrochlorothiazide 10-12.5 MG tablet Commonly known as:  PRINZIDE,ZESTORETIC   loperamide 2 MG capsule Commonly known as:   IMODIUM Take 2 capsules (4 mg total) by mouth 4 (four) times daily as needed for diarrhea or loose stools (NO MORE THAN 8 TABLETS PER DAY.).   magic mouthwash Soln Take 5 mLs by mouth 4 (four) times daily.   magnesium oxide 400 (241.3 Mg) MG tablet Commonly known as:  MAG-OX Take 1 tablet (400 mg total) by mouth 3 (three) times daily.   metFORMIN 500 MG tablet Commonly known as:  GLUCOPHAGE Take 1 tablet (500 mg total) by mouth 2 (two) times daily with a meal.   nadolol 20 MG tablet Commonly known as:  CORGARD Take 1 tablet (20 mg total) by mouth 2 (two) times daily. THIS IS A ONE TIME ORDER.  FUTURE REFILLS NEED TO BE DONE BY PRIMARY MD.   ondansetron 8 MG tablet Commonly known as:  ZOFRAN Take 1 tablet (8 mg total) by mouth every 8 (eight) hours as needed for nausea or vomiting.   pantoprazole 40 MG tablet Commonly known  as:  PROTONIX Take 1 tablet (40 mg total) by mouth daily.   prochlorperazine 10 MG tablet Commonly known as:  COMPAZINE Take 1 tablet (10 mg total) by mouth every 6 (six) hours as needed for nausea or vomiting.        PHYSICAL EXAMINATION  Oncology Vitals 08/07/2016 08/05/2016  Height - 165 cm  Weight - 65.454 kg  Weight (lbs) - 144 lbs 5 oz  BMI (kg/m2) - 24.01 kg/m2  Temp 96.6 98.6  Pulse 58 69  Resp 18 18  SpO2 98 99  BSA (m2) - 1.73 m2   BP Readings from Last 2 Encounters:  08/07/16 (!) 114/52  08/05/16 104/86    Physical Exam  Constitutional: He is oriented to person, place, and time. Vital signs are normal. He appears unhealthy.  HENT:  Head: Normocephalic and atraumatic.  Eyes: Conjunctivae and EOM are normal. Pupils are equal, round, and reactive to light.  Neck: Normal range of motion.  Pulmonary/Chest: Effort normal. No respiratory distress.  Musculoskeletal: Normal range of motion.  Neurological: He is alert and oriented to person, place, and time. Gait normal.  Skin: Skin is warm and dry.  Psychiatric: Affect normal.  Nursing  note and vitals reviewed.   LABORATORY DATA:. Appointment on 08/05/2016  Component Date Value Ref Range Status  . WBC 08/05/2016 3.1* 4.0 - 10.3 10e3/uL Final  . NEUT# 08/05/2016 2.0  1.5 - 6.5 10e3/uL Final  . HGB 08/05/2016 9.1* 13.0 - 17.1 g/dL Final  . HCT 08/05/2016 28.4* 38.4 - 49.9 % Final  . Platelets 08/05/2016 144  140 - 400 10e3/uL Final  . MCV 08/05/2016 85.7  79.3 - 98.0 fL Final  . MCH 08/05/2016 27.6  27.2 - 33.4 pg Final  . MCHC 08/05/2016 32.2  32.0 - 36.0 g/dL Final  . RBC 08/05/2016 3.31* 4.20 - 5.82 10e6/uL Final  . RDW 08/05/2016 18.9* 11.0 - 14.6 % Final  . lymph# 08/05/2016 0.5* 0.9 - 3.3 10e3/uL Final  . MONO# 08/05/2016 0.4  0.1 - 0.9 10e3/uL Final  . Eosinophils Absolute 08/05/2016 0.1  0.0 - 0.5 10e3/uL Final  . Basophils Absolute 08/05/2016 0.0  0.0 - 0.1 10e3/uL Final  . NEUT% 08/05/2016 64.5  39.0 - 75.0 % Final  . LYMPH% 08/05/2016 16.9  14.0 - 49.0 % Final  . MONO% 08/05/2016 13.5  0.0 - 14.0 % Final  . EOS% 08/05/2016 4.4  0.0 - 7.0 % Final  . BASO% 08/05/2016 0.7  0.0 - 2.0 % Final  . Magnesium 08/05/2016 <0.7* 1.5 - 2.5 mg/dl Final  . CEA ([facility]) 08/05/2016 1,102.80* 0.00 - 5.00 ng/mL Final  . Ferritin 08/05/2016 734* 22 - 316 ng/ml Final  . Iron 08/05/2016 37* 42 - 163 ug/dL Final  . TIBC 08/05/2016 193* 202 - 409 ug/dL Final  . UIBC 08/05/2016 156  117 - 376 ug/dL Final  . %SAT 08/05/2016 19* 20 - 55 % Final  . Sodium 08/05/2016 143  136 - 145 mEq/L Final  . Potassium 08/05/2016 3.6  3.5 - 5.1 mEq/L Final  . Chloride 08/05/2016 109  98 - 109 mEq/L Final  . CO2 08/05/2016 24  22 - 29 mEq/L Final  . Glucose 08/05/2016 153* 70 - 140 mg/dl Final  . BUN 08/05/2016 5.8* 7.0 - 26.0 mg/dL Final  . Creatinine 08/05/2016 0.8  0.7 - 1.3 mg/dL Final  . Total Bilirubin 08/05/2016 0.47  0.20 - 1.20 mg/dL Final  . Alkaline Phosphatase 08/05/2016 179* 40 - 150 U/L  Final  . AST 08/05/2016 23  5 - 34 U/L Final  . ALT 08/05/2016 16  0 - 55 U/L Final   . Total Protein 08/05/2016 7.0  6.4 - 8.3 g/dL Final  . Albumin 08/05/2016 3.3* 3.5 - 5.0 g/dL Final  . Calcium 08/05/2016 8.2* 8.4 - 10.4 mg/dL Final  . Anion Gap 08/05/2016 9  3 - 11 mEq/L Final  . EGFR 08/05/2016 >90  >90 ml/min/1.73 m2 Final    RADIOGRAPHIC STUDIES: No results found.  ASSESSMENT/PLAN:    Cancer of sigmoid colon [facility]) Patient received cycle 23 of his Folfiri/panitumumab chemotherapy regimen on 08/05/2016.  He also continues to receive IV magnesium on a weekly basis.  He is scheduled to return on 08/14/2016 for labs, flush, and his next infusion.  Abdominal pain Patient presented to the [facility] today to receive his magnesium infusion for his chronically low magnesium level.  He complained to the nurse of some very vague, intermittent abdominal cramping.  Patient has a history of diarrhea secondary to the irinotecan.  Patient requested a pain pill; was given 1 Percocet.  Reviewed all findings with [name]; and she agreed with giving the patient a 1 Percocet.   Patient stated understanding of all instructions; and was in agreement with this plan of care. The patient knows to call the clinic with any problems, questions or concerns.   Total time spent with patient was 15 minutes;  with greater than 75 percent of that time spent in face to face counseling regarding patient's symptoms,  and coordination of care and follow up.  Disclaimer:This dictation was prepared with Dragon/digital dictation along with [employer]. Any transcriptional errors that result from this process are unintentional.  Drue Second, NP 08/07/2016

## 2016-08-07 NOTE — Progress Notes (Signed)
Patient complained of abdominal pain. Selena Lesser, NP, over to see patient in the infusion room.

## 2016-08-07 NOTE — Assessment & Plan Note (Signed)
Patient presented to the [facility] today to receive his magnesium infusion for his chronically low magnesium level.  He complained to the nurse of some very vague, intermittent abdominal cramping.  Patient has a history of diarrhea secondary to the irinotecan.  Patient requested a pain pill; was given 1 Percocet.  Reviewed all findings with [name]; and she agreed with giving the patient a 1 Percocet.

## 2016-08-07 NOTE — Patient Instructions (Signed)
Hypomagnesemia °Hypomagnesemia is a condition in which the level of magnesium in the blood is low. Magnesium is a mineral that is found in many foods. It is used in many different processes in the body. Hypomagnesemia can affect every organ in the body. It can cause life-threatening problems. °CAUSES °Causes of hypomagnesemia include: °· Not getting enough magnesium in your diet. °· Malnutrition. °· Problems with absorbing magnesium from the intestines. °· Dehydration. °· Alcohol abuse. °· Vomiting. °· Severe diarrhea. °· Some medicines, including medicines that make you urinate more. °· Certain diseases, such as kidney disease, diabetes, and overactive thyroid. °SIGNS AND SYMPTOMS °· Involuntary shaking or trembling of a body part (tremor). °· Confusion. °· Muscle weakness. °· Sensitivity to light, sound, and touch. °· Psychiatric issues, such as depression, irritability, or psychosis. °· Sudden tightening of muscles (muscle spasms). °· Tingling in the arms and legs. °· A feeling of fluttering of the heart. °These symptoms are more severe if magnesium levels drop suddenly. °DIAGNOSIS °To make a diagnosis, your health care provider will do a physical exam and order blood and urine tests. °TREATMENT °Treatment will depend on the cause and the severity of your condition. It may involve: °· A magnesium supplement. This can be taken in pill form. It can also be given through an IV tube. This is usually done if the condition is severe. °· Changes to your diet. You may be directed to eat foods that have a lot of magnesium, such as green leafy vegetables, peas, beans, and nuts. °· Eliminating alcohol from your diet. °HOME CARE INSTRUCTIONS °· Include foods with magnesium in your diet. Foods that are rich in magnesium include green vegetables, beans, nuts and seeds, and whole grains. °· Take medicines only as directed by your health care provider. °· Take magnesium supplements if your health care provider instructs you to  do that. Take them as directed. °· Have your magnesium levels monitored as directed by your health care provider. °· When you are active, drink fluids that contain electrolytes. °· Keep all follow-up visits as directed by your health care provider. This is important. °SEEK MEDICAL CARE IF: °· You get worse instead of better. °· Your symptoms return. °SEEK IMMEDIATE MEDICAL CARE IF: °· Your symptoms are severe. °  °This information is not intended to replace advice given to you by your health care provider. Make sure you discuss any questions you have with your health care provider. °  °Document Released: 07/10/2005 Document Revised: 11/04/2014 Document Reviewed: 05/30/2014 °Elsevier Interactive Patient Education ©2016 [employer] ° °

## 2016-08-14 ENCOUNTER — Ambulatory Visit: Payer: PPO

## 2016-08-14 ENCOUNTER — Other Ambulatory Visit (HOSPITAL_BASED_OUTPATIENT_CLINIC_OR_DEPARTMENT_OTHER): Payer: PPO

## 2016-08-14 ENCOUNTER — Ambulatory Visit (HOSPITAL_BASED_OUTPATIENT_CLINIC_OR_DEPARTMENT_OTHER): Payer: PPO

## 2016-08-14 ENCOUNTER — Other Ambulatory Visit: Payer: Self-pay | Admitting: Hematology

## 2016-08-14 VITALS — BP 120/63 | HR 63 | Temp 98.5°F | Resp 18

## 2016-08-14 DIAGNOSIS — Z95828 Presence of other vascular implants and grafts: Secondary | ICD-10-CM

## 2016-08-14 DIAGNOSIS — D509 Iron deficiency anemia, unspecified: Secondary | ICD-10-CM

## 2016-08-14 LAB — MAGNESIUM: MAGNESIUM: 0.9 mg/dL — AB (ref 1.5–2.5)

## 2016-08-14 MED ORDER — SODIUM CHLORIDE 0.9 % IV SOLN
Freq: Once | INTRAVENOUS | Status: AC
  Administered 2016-08-14: 14:00:00 via INTRAVENOUS
  Filled 2016-08-14: qty 250

## 2016-08-14 MED ORDER — SODIUM CHLORIDE 0.9 % IV SOLN
Freq: Once | INTRAVENOUS | Status: AC
  Administered 2016-08-14: 13:00:00 via INTRAVENOUS

## 2016-08-14 MED ORDER — SODIUM CHLORIDE 0.9% FLUSH
10.0000 mL | INTRAVENOUS | Status: DC | PRN
  Administered 2016-08-14: 10 mL via INTRAVENOUS
  Filled 2016-08-14: qty 10

## 2016-08-14 MED ORDER — SODIUM CHLORIDE 0.9 % IJ SOLN
10.0000 mL | INTRAMUSCULAR | Status: DC | PRN
  Administered 2016-08-14: 10 mL via INTRAVENOUS
  Filled 2016-08-14: qty 10

## 2016-08-14 MED ORDER — HEPARIN SOD (PORK) LOCK FLUSH 100 UNIT/ML IV SOLN
500.0000 [IU] | Freq: Once | INTRAVENOUS | Status: AC
  Administered 2016-08-14: 500 [IU] via INTRAVENOUS
  Filled 2016-08-14: qty 5

## 2016-08-14 MED ORDER — SODIUM CHLORIDE 0.9 % IV SOLN: 6.0000 g | Freq: Once | INTRAVENOUS | Status: DC

## 2016-08-14 NOTE — Patient Instructions (Signed)
Hypomagnesemia °Hypomagnesemia is a condition in which the level of magnesium in the blood is low. Magnesium is a mineral that is found in many foods. It is used in many different processes in the body. Hypomagnesemia can affect every organ in the body. It can cause life-threatening problems. °CAUSES °Causes of hypomagnesemia include: °· Not getting enough magnesium in your diet. °· Malnutrition. °· Problems with absorbing magnesium from the intestines. °· Dehydration. °· Alcohol abuse. °· Vomiting. °· Severe diarrhea. °· Some medicines, including medicines that make you urinate more. °· Certain diseases, such as kidney disease, diabetes, and overactive thyroid. °SIGNS AND SYMPTOMS °· Involuntary shaking or trembling of a body part (tremor). °· Confusion. °· Muscle weakness. °· Sensitivity to light, sound, and touch. °· Psychiatric issues, such as depression, irritability, or psychosis. °· Sudden tightening of muscles (muscle spasms). °· Tingling in the arms and legs. °· A feeling of fluttering of the heart. °These symptoms are more severe if magnesium levels drop suddenly. °DIAGNOSIS °To make a diagnosis, your health care provider will do a physical exam and order blood and urine tests. °TREATMENT °Treatment will depend on the cause and the severity of your condition. It may involve: °· A magnesium supplement. This can be taken in pill form. It can also be given through an IV tube. This is usually done if the condition is severe. °· Changes to your diet. You may be directed to eat foods that have a lot of magnesium, such as green leafy vegetables, peas, beans, and nuts. °· Eliminating alcohol from your diet. °HOME CARE INSTRUCTIONS °· Include foods with magnesium in your diet. Foods that are rich in magnesium include green vegetables, beans, nuts and seeds, and whole grains. °· Take medicines only as directed by your health care provider. °· Take magnesium supplements if your health care provider instructs you to  do that. Take them as directed. °· Have your magnesium levels monitored as directed by your health care provider. °· When you are active, drink fluids that contain electrolytes. °· Keep all follow-up visits as directed by your health care provider. This is important. °SEEK MEDICAL CARE IF: °· You get worse instead of better. °· Your symptoms return. °SEEK IMMEDIATE MEDICAL CARE IF: °· Your symptoms are severe. °  °This information is not intended to replace advice given to you by your health care provider. Make sure you discuss any questions you have with your health care provider. °  °Document Released: 07/10/2005 Document Revised: 11/04/2014 Document Reviewed: 05/30/2014 °Elsevier Interactive Patient Education ©2016 [employer] ° °

## 2016-08-19 ENCOUNTER — Other Ambulatory Visit (HOSPITAL_BASED_OUTPATIENT_CLINIC_OR_DEPARTMENT_OTHER): Payer: PPO

## 2016-08-19 ENCOUNTER — Ambulatory Visit (HOSPITAL_BASED_OUTPATIENT_CLINIC_OR_DEPARTMENT_OTHER): Payer: PPO | Admitting: Hematology

## 2016-08-19 ENCOUNTER — Ambulatory Visit (HOSPITAL_BASED_OUTPATIENT_CLINIC_OR_DEPARTMENT_OTHER): Payer: PPO

## 2016-08-19 ENCOUNTER — Encounter: Payer: Self-pay | Admitting: Hematology

## 2016-08-19 ENCOUNTER — Ambulatory Visit: Payer: PPO

## 2016-08-19 ENCOUNTER — Ambulatory Visit (HOSPITAL_COMMUNITY)
Admission: RE | Admit: 2016-08-19 | Discharge: 2016-08-19 | Disposition: A | Discharge status: Home / Self Care | Payer: PPO | Source: Ambulatory Visit | Attending: Hematology | Admitting: Hematology

## 2016-08-19 ENCOUNTER — Telehealth: Payer: Self-pay | Admitting: Hematology

## 2016-08-19 VITALS — BP 130/74 | HR 67 | Temp 97.6°F | Resp 18 | Ht 65.0 in | Wt 144.2 lb

## 2016-08-19 DIAGNOSIS — C7800 Secondary malignant neoplasm of unspecified lung: Secondary | ICD-10-CM

## 2016-08-19 DIAGNOSIS — C187 Malignant neoplasm of sigmoid colon: Secondary | ICD-10-CM

## 2016-08-19 DIAGNOSIS — R932 Abnormal findings on diagnostic imaging of liver and biliary tract: Secondary | ICD-10-CM | POA: Insufficient documentation

## 2016-08-19 DIAGNOSIS — R911 Solitary pulmonary nodule: Secondary | ICD-10-CM | POA: Insufficient documentation

## 2016-08-19 DIAGNOSIS — Z5111 Encounter for antineoplastic chemotherapy: Secondary | ICD-10-CM

## 2016-08-19 DIAGNOSIS — D6481 Anemia due to antineoplastic chemotherapy: Secondary | ICD-10-CM

## 2016-08-19 DIAGNOSIS — C787 Secondary malignant neoplasm of liver and intrahepatic bile duct: Secondary | ICD-10-CM | POA: Insufficient documentation

## 2016-08-19 DIAGNOSIS — C772 Secondary and unspecified malignant neoplasm of intra-abdominal lymph nodes: Secondary | ICD-10-CM

## 2016-08-19 DIAGNOSIS — I700 Atherosclerosis of aorta: Secondary | ICD-10-CM | POA: Diagnosis not present

## 2016-08-19 DIAGNOSIS — R590 Localized enlarged lymph nodes: Secondary | ICD-10-CM | POA: Insufficient documentation

## 2016-08-19 DIAGNOSIS — I2510 Atherosclerotic heart disease of native coronary artery without angina pectoris: Secondary | ICD-10-CM | POA: Insufficient documentation

## 2016-08-19 DIAGNOSIS — Z5112 Encounter for antineoplastic immunotherapy: Secondary | ICD-10-CM | POA: Diagnosis not present

## 2016-08-19 DIAGNOSIS — E119 Type 2 diabetes mellitus without complications: Secondary | ICD-10-CM

## 2016-08-19 DIAGNOSIS — I10 Essential (primary) hypertension: Secondary | ICD-10-CM

## 2016-08-19 DIAGNOSIS — K254 Chronic or unspecified gastric ulcer with hemorrhage: Secondary | ICD-10-CM

## 2016-08-19 DIAGNOSIS — K766 Portal hypertension: Secondary | ICD-10-CM | POA: Diagnosis not present

## 2016-08-19 DIAGNOSIS — E46 Unspecified protein-calorie malnutrition: Secondary | ICD-10-CM

## 2016-08-19 DIAGNOSIS — C7972 Secondary malignant neoplasm of left adrenal gland: Secondary | ICD-10-CM | POA: Diagnosis not present

## 2016-08-19 DIAGNOSIS — I5042 Chronic combined systolic (congestive) and diastolic (congestive) heart failure: Secondary | ICD-10-CM

## 2016-08-19 DIAGNOSIS — D500 Iron deficiency anemia secondary to blood loss (chronic): Secondary | ICD-10-CM

## 2016-08-19 DIAGNOSIS — L270 Generalized skin eruption due to drugs and medicaments taken internally: Secondary | ICD-10-CM

## 2016-08-19 DIAGNOSIS — I429 Cardiomyopathy, unspecified: Secondary | ICD-10-CM

## 2016-08-19 DIAGNOSIS — D509 Iron deficiency anemia, unspecified: Secondary | ICD-10-CM

## 2016-08-19 LAB — CBC WITH DIFFERENTIAL/PLATELET
BASO%: 0.5 % (ref 0.0–2.0)
Basophils Absolute: 0 10*3/uL (ref 0.0–0.1)
EOS ABS: 0.2 10*3/uL (ref 0.0–0.5)
EOS%: 3.6 % (ref 0.0–7.0)
HCT: 29.1 % — ABNORMAL LOW (ref 38.4–49.9)
HEMOGLOBIN: 9.5 g/dL — AB (ref 13.0–17.1)
LYMPH%: 12.3 % — ABNORMAL LOW (ref 14.0–49.0)
MCH: 27.8 pg (ref 27.2–33.4)
MCHC: 32.5 g/dL (ref 32.0–36.0)
MCV: 85.5 fL (ref 79.3–98.0)
MONO#: 0.5 10*3/uL (ref 0.1–0.9)
MONO%: 11.5 % (ref 0.0–14.0)
NEUT%: 72.1 % (ref 39.0–75.0)
NEUTROS ABS: 3.3 10*3/uL (ref 1.5–6.5)
Platelets: 176 10*3/uL (ref 140–400)
RBC: 3.4 10*6/uL — ABNORMAL LOW (ref 4.20–5.82)
RDW: 19 % — AB (ref 11.0–14.6)
WBC: 4.6 10*3/uL (ref 4.0–10.3)
lymph#: 0.6 10*3/uL — ABNORMAL LOW (ref 0.9–3.3)

## 2016-08-19 LAB — COMPREHENSIVE METABOLIC PANEL
ALBUMIN: 3.4 g/dL — AB (ref 3.5–5.0)
ALK PHOS: 204 U/L — AB (ref 40–150)
ALT: 24 U/L (ref 0–55)
AST: 33 U/L (ref 5–34)
Anion Gap: 10 mEq/L (ref 3–11)
BILIRUBIN TOTAL: 0.64 mg/dL (ref 0.20–1.20)
BUN: 6.8 mg/dL — ABNORMAL LOW (ref 7.0–26.0)
CALCIUM: 7.8 mg/dL — AB (ref 8.4–10.4)
CO2: 23 mEq/L (ref 22–29)
Chloride: 109 mEq/L (ref 98–109)
Creatinine: 0.7 mg/dL (ref 0.7–1.3)
GLUCOSE: 122 mg/dL (ref 70–140)
Potassium: 3.9 mEq/L (ref 3.5–5.1)
SODIUM: 142 meq/L (ref 136–145)
TOTAL PROTEIN: 7.3 g/dL (ref 6.4–8.3)

## 2016-08-19 LAB — MAGNESIUM: Magnesium: 0.7 mg/dl — CL (ref 1.5–2.5)

## 2016-08-19 MED ORDER — SODIUM CHLORIDE 0.9 % IV SOLN
6.2000 mg/kg | Freq: Once | INTRAVENOUS | Status: AC
  Administered 2016-08-19: 400 mg via INTRAVENOUS
  Filled 2016-08-19: qty 20

## 2016-08-19 MED ORDER — ONDANSETRON HCL 8 MG PO TABS
8.0000 mg | ORAL_TABLET | Freq: Once | ORAL | Status: AC
  Administered 2016-08-19: 8 mg via ORAL

## 2016-08-19 MED ORDER — ONDANSETRON HCL 8 MG PO TABS
ORAL_TABLET | ORAL | Status: AC
  Filled 2016-08-19: qty 1

## 2016-08-19 MED ORDER — SODIUM CHLORIDE 0.9 % IV SOLN
140.0000 mg/m2 | Freq: Once | INTRAVENOUS | Status: AC
  Administered 2016-08-19: 240 mg via INTRAVENOUS
  Filled 2016-08-19: qty 10

## 2016-08-19 MED ORDER — IOPAMIDOL (ISOVUE-300) INJECTION 61%
100.0000 mL | Freq: Once | INTRAVENOUS | Status: AC | PRN
  Administered 2016-08-19: 100 mL via INTRAVENOUS

## 2016-08-19 MED ORDER — DEXAMETHASONE SODIUM PHOSPHATE 10 MG/ML IJ SOLN
10.0000 mg | Freq: Once | INTRAMUSCULAR | Status: AC
  Administered 2016-08-19: 10 mg via INTRAVENOUS

## 2016-08-19 MED ORDER — DEXAMETHASONE SODIUM PHOSPHATE 10 MG/ML IJ SOLN
INTRAMUSCULAR | Status: AC
  Filled 2016-08-19: qty 1

## 2016-08-19 MED ORDER — ATROPINE SULFATE 1 MG/ML IJ SOLN
0.5000 mg | Freq: Once | INTRAMUSCULAR | Status: AC | PRN
  Administered 2016-08-19: 0.5 mg via INTRAVENOUS

## 2016-08-19 MED ORDER — ATROPINE SULFATE 1 MG/ML IJ SOLN
INTRAMUSCULAR | Status: AC
  Filled 2016-08-19: qty 1

## 2016-08-19 MED ORDER — SODIUM CHLORIDE 0.9 % IV SOLN
1800.0000 mg/m2 | INTRAVENOUS | Status: DC
  Administered 2016-08-19: 3050 mg via INTRAVENOUS
  Filled 2016-08-19: qty 61

## 2016-08-19 MED ORDER — SODIUM CHLORIDE 0.9 % IV SOLN
Freq: Once | INTRAVENOUS | Status: AC
  Administered 2016-08-19: 13:00:00 via INTRAVENOUS

## 2016-08-19 NOTE — Patient Instructions (Signed)
[facility] Discharge Instructions for Patients Receiving Chemotherapy  Today you received the following chemotherapy agents: Iranotecan, Vectibix and Adrucil.   To help prevent nausea and vomiting after your treatment, we encourage you to take your nausea medication as directed.    If you develop nausea and vomiting that is not controlled by your nausea medication, call the clinic.   BELOW ARE SYMPTOMS THAT SHOULD BE REPORTED IMMEDIATELY:  *FEVER GREATER THAN 100.5 F  *CHILLS WITH OR WITHOUT FEVER  NAUSEA AND VOMITING THAT IS NOT CONTROLLED WITH YOUR NAUSEA MEDICATION  *UNUSUAL SHORTNESS OF BREATH  *UNUSUAL BRUISING OR BLEEDING  TENDERNESS IN MOUTH AND THROAT WITH OR WITHOUT PRESENCE OF ULCERS  *URINARY PROBLEMS  *BOWEL PROBLEMS  UNUSUAL RASH Items with * indicate a potential emergency and should be followed up as soon as possible.  Feel free to call the clinic you have any questions or concerns. The clinic phone number is [phone].  Please show the [place] at check-in to the Emergency Department and triage nurse.

## 2016-08-19 NOTE — Telephone Encounter (Signed)
GAVE PATIENT SON AVS REPORT AND APPOINTMENTS FOR October AND November

## 2016-08-19 NOTE — Patient Instructions (Signed)

## 2016-08-19 NOTE — Progress Notes (Signed)
Jason Reilly  [phone] [phone]  Clinic follow up Note   Patient Care Team: [name], MD as PCP - General (Specialist) [name], CRNP as Nurse Practitioner (Nurse Practitioner) Truitt Merle, MD as Consulting Physician (Hematology) Ladene Artist, MD as Consulting Physician (Gastroenterology) 08/19/2016  CHIEF COMPLAINTS Follow up metastatic sigmoid colon cancer  Oncology History   He is a Metastatic colon cancer to liver   Staging form: Colon and Rectum, AJCC 7th Edition     Clinical: Stage Unknown ([place], NX, M1) - Unsigned      Cancer of sigmoid colon ([place])   10/28/2014 Tumor Marker    AFP 3.2 CEA > 10,000 CA 19-9 12,929.6. tumor (-) KRAS and NRAS mutation.       11/10/2014 Imaging    PET scan showed hypermetabolic mass in the sigmoid colon was noted metastasis in the retroperitoneum. Probable left adrenal and pulmonary metastasis, and diffuse liver metastasis.      11/21/2014 Initial Diagnosis    Metastatic colon cancer to liver, lung, abd nodes and left adrenal gland. Diagnosis was made by liver biopsy.       11/30/2014 - 05/23/2015 Chemotherapy    First line chemo [id number], Panitumumab added from second cycle, chemo held due to recurrent GI bleeding        12/28/2014 - 12/31/2014 Hospital Admission    Was admitted for dehydration, neutropenia fever with UTI, and severe skin rash.      02/08/2015 - 02/12/2015 Hospital Admission    He was admitted for upper GI bleeding, e.g. showed a gastric ulcer with clots, status post appendectomy injection. He also received a blood transfusion.      03/01/2015 Tumor Marker    CEA 694, CA19.9 462      03/13/2015 Imaging    CT CAP showed partial response, no new lesions.       05/31/2015 - 06/02/2015 Hospital Admission    He was admitted to [facility] in [place] due to upper GI bleeding, EGD showed gastric and dudenal ulcers       06/27/2015 - 06/29/2015 Hospital Admission    he was  admitted for dairrhea and pancolitis, c-diff and stool cultures were negative, treated with antibiotics      07/12/2015 -  Chemotherapy    panitumumab 75m/kg, every 2 weeks      09/19/2015 -  Chemotherapy    Irinotecan 1887mm2 every 2 weeks, dose reduction due to diarrhea. 5-Fu added on 06/10/2016 due to slight disease progression       11/08/2015 - 11/10/2015 Hospital Admission    Pt was admitted for sepsis and hypotension, was found to have (+) influenza A and treated.       11/30/2015 Imaging     CT scans reviewed continued improvement in the liver metastasis, no other new lesions.      05/06/2016 Tumor Marker    CEA has gradually increased from 110 in March 2017 to 566 in July 2070      06/04/2016 Imaging    Restaging CT scan showed similar to mild increase in hepatic metastasis, mild enlargement of right adrenal nodule, primary similar pulmonary nodules. A left upper lobe nodule has resolved.        HISTORY OF PRESENTING ILLNESS:  [name] [age].o. male is here because of abnormal CT findings, which is very suspicious for malignancy. [place] He came in with his son  and an interpreter.  He has been feeling fatigued since two month ago. He is still able to do all ADLs. He otherwise denies any pain, bloating or nausea.  He lost about 20lbs in 3 month. His appetite is lower than before, eats less, no change of his bowl habits.  She denied any hematochezia or melana. Per his son, he has had some personality changes daily, irritable, slightly confused some time.  He was evaluated by his primary care physician. Lab test reviewed hepatitis B infection, which he did not know before, and elevated alkaline phosphatase, his liver function and the rest of the liver function was not remarkable. [place] of abdomen was obtained on 07/22/2014, which showed diffusely abnormal liver with multiple echogenic lesions. CT of abdomen with and without  contrast was done on 08/26/2014, which reviewed here at a medically with multiple large partially calcified hepatic masses consistent with metastatic disease. Mild retroperitoneal adenopathy with the largest node measuring 1.6 cm. And nonspecific 1.4 cm left adrenal nodule was also noticed. His tumor marker showed CEA greater than 10,000, CA 19-9 12,929, AFP 3.2 (normal). He was referred to [facility] and was evaluated by nurse practitioner [name]. Treatment for hepatitis B was not recommended based on his virus load.  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 He was evaluated by a cardiologist in 2014. He denies any significant dyspnea on exertion. No leg swollen.  CURRENT THERAPY:  1. panitumumab 57m/kg, every 2 weeks, started on 07/12/2015 2. Irinotecan 1864mm2 every 2 weeks added on 09/19/2015, dose decreased to 16032m2 from cycle 2, and further decreased to 125m95m from cycle 10 due to diarrhea and anorexia, changed to 140mg56mfrom cycle 13. 3. 5-FU 1800mg/66mver 46 hours added back from 06/10/2016 due to mild disease progression 4. He also receives IV magnesium 6g weekly   INTERIM HISTORY: Jason Reilly for follow-up and chemotherapy. He is accompanied by his son to clinic today. He is doing well overall, denies any significant pain, nausea, diarrhea or other new symptoms. His skin rash on his face and neck is stable, he does use sunscreen and topical steroids. He has good appetite, is able to function well at home, remains to be physically active. His weight is stable.  MEDICAL HISTORY:  Past Medical History:  Diagnosis Date  . Abnormal EKG   . Diabetes mellitus without complication (HCC)    metformin  . Gastric ulcer   . H. pylori infection   . Hepatitis B   . Hypertension   . Metastatic colon cancer to liver (HCC)  [place]-ischemic cardiomyopathy (HCC)  Toast[place]achycardia     SURGICAL HISTORY: Past  Surgical History:  Procedure Laterality Date  . ESOPHAGOGASTRODUODENOSCOPY N/A 02/08/2015   Procedure: ESOPHAGOGASTRODUODENOSCOPY (EGD);  Surgeon: MalcolLadene Artist [facility];  Service: Endoscopy;  Laterality: N/A;  . ESOPHAGOGASTRODUODENOSCOPY (EGD) WITH PROPOFOL N/A 08/15/2015   Procedure: ESOPHAGOGASTRODUODENOSCOPY (EGD) WITH PROPOFOL;  Surgeon: MalcolLadene Artist Location: [facility];  Service: Endoscopy;  Laterality: N/A;  . LEFT AND RIGHT HEART CATHETERIZATION WITH CORONARY ANGIOGRAM N/A 03/29/2013   Procedure: LEFT AND RIGHT HEART CATHETERIZATION WITH CORONARY ANGIOGRAM;  Surgeon: [place]ie [place] [facility];  Service: Cardiovascular;  Laterality: N/A;  . Nuclear Stress Test  03/03/2013   High risk - consistent with nonischemic cardiomyopathy    SOCIAL HISTORY: Social History   Social History  . Marital status: Married  Spouse name: N/A  . Number of children: N/A  . Years of education: N/A   Occupational History  . Not on file.   Social History Main Topics  . Smoking status: Former Smoker    Types: Cigarettes    Quit date: 11/07/2008  . Smokeless tobacco: Never Used  . Alcohol use Yes     Comment: occasional  . Drug use: No  . Sexual activity: No   Other Topics Concern  . Not on file   Social History Narrative   Married   Enjoys walking   Has lived in [place] > 16 years    FAMILY HISTORY: No family history of liver disease or malignancy.  ALLERGIES:  has No Known Allergies.  MEDICATIONS:  Current Outpatient Prescriptions on File Prior to Visit  Medication Sig Dispense Refill  . clindamycin (CLINDAGEL) 1 % gel Apply topically 2 (two) times daily. 30 g 2  . diphenoxylate-atropine (LOMOTIL) 2.5-0.025 MG tablet Take 1-2 tablets by mouth 4 (four) times daily as needed for diarrhea or loose stools. 30 tablet 0  . folic acid (FOLVITE) 1 MG tablet Take 1 tablet (1 mg total) by mouth daily. 30 tablet 3  . hydrocortisone 1 % ointment  Apply 1 application topically 2 (two) times daily. 56 g 0  . lidocaine-prilocaine (EMLA) cream Apply 1 application topically as needed. Apply to [facility] cath at least one hour before needle stick. 30 g 2  . lisinopril-hydrochlorothiazide (PRINZIDE,ZESTORETIC) 10-12.5 MG tablet     . loperamide (IMODIUM) 2 MG capsule Take 2 capsules (4 mg total) by mouth 4 (four) times daily as needed for diarrhea or loose stools (NO MORE THAN 8 TABLETS PER DAY.). 60 capsule 3  . magic mouthwash SOLN Take 5 mLs by mouth 4 (four) times daily. 120 mL 1  . magnesium oxide (MAG-OX) 400 (241.3 Mg) MG tablet Take 1 tablet (400 mg total) by mouth 3 (three) times daily. 90 tablet 3  . metFORMIN (GLUCOPHAGE) 500 MG tablet Take 1 tablet (500 mg total) by mouth 2 (two) times daily with a meal. 60 tablet 0  . nadolol (CORGARD) 20 MG tablet Take 1 tablet (20 mg total) by mouth 2 (two) times daily. THIS IS A ONE TIME ORDER.  FUTURE REFILLS NEED TO BE DONE BY PRIMARY MD. 60 tablet 1  . ondansetron (ZOFRAN) 8 MG tablet Take 1 tablet (8 mg total) by mouth every 8 (eight) hours as needed for nausea or vomiting. 45 tablet 2  . pantoprazole (PROTONIX) 40 MG tablet Take 1 tablet (40 mg total) by mouth daily. 30 tablet 2  . prochlorperazine (COMPAZINE) 10 MG tablet Take 1 tablet (10 mg total) by mouth every 6 (six) hours as needed for nausea or vomiting. 30 tablet 2   Current Facility-Administered Medications on File Prior to Visit  Medication Dose Route Frequency Provider Last Rate Last Dose  . 0.9 %  sodium chloride infusion   Intravenous Once Truitt Merle, MD      . ferumoxytol [facility]) 510 mg in sodium chloride 0.9 % 100 mL IVPB  510 mg Intravenous Once Truitt Merle, MD      . sodium chloride 0.9 % injection 10 mL  10 mL Intracatheter PRN Truitt Merle, MD   10 mL at 10/03/15 1705  . sodium chloride 0.9 % injection 10 mL  10 mL Intravenous PRN Truitt Merle, MD   10 mL at 05/14/16 1127  ;   REVIEW OF SYSTEMS:   Constitutional: Denies fevers,  chills or  abnormal night sweats, (+) fatigue  Eyes: Denies blurriness of vision, double vision or watery eyes Ears, nose, mouth, throat, and face: Denies mucositis or sore throat Respiratory: Denies cough, dyspnea or wheezes Cardiovascular: Denies palpitation, chest discomfort or lower extremity swelling Gastrointestinal: Denies nausea, heartburn or change in bowel habits Skin: Denies abnormal skin rashes Lymphatics: Denies new lymphadenopathy or easy bruising Neurological:Denies numbness, tingling or new weaknesses Behavioral/Psych: Mood is stable, no new changes, (+) insomnia All other systems were reviewed with the patient and are negative.  PHYSICAL EXAMINATION: BP 130/74 (BP Location: Left Arm, Patient Position: Sitting)   Pulse 67   Temp 97.6 F (36.4 C) (Oral)   Resp 18   Ht '5\' 5"'  (1.651 m)   Wt 144 lb 3.2 oz (65.4 kg)   SpO2 97%   BMI 24.00 kg/m   ECOG PERFORMANCE STATUS: 1 Vital sign were taken at in the infusion room, within normal limits. GENERAL:alert, no distress and comfortable SKIN: (+) Dry skin,  no skin ulcer, (+) scatter skin rashes on his neck and upper chest, some are resolving with skin pigmentation. No discharge. EYES: normal, conjunctiva are pink and non-injected, sclera clear OROPHARYNX:no exudate, no erythema and lips, buccal mucosa, and tongue with mild discoloration at the tip.  NECK: supple, thyroid normal size, non-tender, without nodularity LYMPH:  no palpable lymphadenopathy in the cervical, axillary or inguinal LUNGS: clear to auscultation and percussion with normal breathing effort, no rales   HEART: regular rate & rhythm and no murmurs and no lower extremity edema ABDOMEN:abdomen soft, non-tender, no hepatomegaly, no splenomegaly and normal bowel sounds Musculoskeletal:no cyanosis of digits and no clubbing  PSYCH: alert & oriented x 3 with fluent speech NEURO: no focal motor/sensory deficits  LABORATORY DATA:  I have reviewed the data as  listed CBC Latest Ref Rng & Units 08/05/2016 07/22/2016 07/08/2016  WBC 4.0 - 10.3 10e3/uL 3.1(L) 3.6(L) 4.5  Hemoglobin 13.0 - 17.1 g/dL 9.1(L) 8.9(L) 8.6(L)  Hematocrit 38.4 - 49.9 % 28.4(L) 27.3(L) 26.3(L)  Platelets 140 - 400 10e3/uL 144 146 140    CMP Latest Ref Rng & Units 08/19/2016 08/05/2016 07/22/2016  Glucose 70 - 140 mg/dl 122 153(H) 180(H)  BUN 7.0 - 26.0 mg/dL 6.8(L) 5.8(L) 5.9(L)  Creatinine 0.7 - 1.3 mg/dL 0.7 0.8 0.8  Sodium 136 - 145 mEq/L 142 143 143  Potassium 3.5 - 5.1 mEq/L 3.9 3.6 3.7  Chloride 101 - 111 mmol/L - - -  CO2 22 - 29 mEq/L '23 24 23  ' Calcium 8.4 - 10.4 mg/dL 7.8(L) 8.2(L) 8.2(L)  Total Protein 6.4 - 8.3 g/dL 7.3 7.0 6.9  Total Bilirubin 0.20 - 1.20 mg/dL 0.64 0.47 0.57  Alkaline Phos 40 - 150 U/L 204(H) 179(H) 187(H)  AST 5 - 34 U/L 33 23 23  ALT 0 - 55 U/L '24 16 19    ' INITIAL tumor markers AFP 3.2 CEA > 10,000 CA 19-9 12,929.6  Results for EATHAN, GROMAN (MRN [id number]) as of 08/19/2016 06:33  Ref. Range 07/08/2016 10:05 08/05/2016 09:12  CEA Latest Ref Range: 0.0 - 4.7 ng/mL 716.6 (H)   CEA ([facility]) Latest Ref Range: 0.00 - 5.00 ng/mL 834.19 (H) 1,102.80 (H)    Pathology report  Liver, needle/core biopsy - METASTATIC ADENOCARCINOMA, SEE COMMENT. Microscopic Comment The adenocarcinoma demonstrates the following immunophenotype: Cytokeratin 7 - negative expression. Cytokeratin 20 - strong diffuse expression. CD2 - strong diffuse expression. Overall the morphology and immunophenotype are that of metastatic adenocarcinoma primary to colorectum. The recent nuclear medicine scan  demonstrating sigmoid mass with associated liver masses is noted.  FoundationOne test result:    RADIOGRAPHIC STUDIES: I have personally reviewed the outside CT scan image with patient and his son.   CT chest, abdomen and pelvis with IV contrast on  08/19/2016  IMPRESSION: 1. Respiratory motion degrades image quality in the lungs. Favor slight enlargement of a  right lower lobe pulmonary nodule as well as development of a new pulmonary nodule in the left upper lobe. 2. Hepatic metastatic disease is grossly stable. Associated marginal irregularity of the liver is likely due to hepatic fibrosis from treated metastatic disease, with evidence of portal hypertension. 3. Right adrenal nodule is stable to minimally enlarged. 4. Aortic atherosclerosis (ICD10-170.0). Coronary artery calcification.   ASSESSMENT & PLAN:  70 year old [place] male, with past history of hypertension and dilated nonischemic gammopathy with EF 25%, no clinical signs of heart failure, who was found to have hepatitis B infection lately, and multiple liver lesions on the CT scan. He has extremely high CEA and CA 19-9 levels. PET scan reviewed a hypermetabolic sigmoid colon mass, diffuse liver metastasis, probable lung and adrenal gland metastasis.  1. Metastatic sigmoid colon cancer, with diffuse liver, lungs, node and left adrenal gland metastases. KRAS/NRAS wild type, MSI-stable -Liver biopsy showed metastatic adenocarcinoma. His tumor were strongly positive for CK20 and CD2, consistent with primary colorectal primary. KRAS and NRAS mutations were not detected.  -Pt understands that this is incurable cancer, and he has very high disease burden and overall prognosis is poor. The treatment goal is palliative -I discussed his restaging CT from 08/19/2016 which showed overall stable disease in liver, a few small lung nodules, slightly bigger, will continue monitoring. -I think his scan showed overall some improvement since we restarted on 5-FU in August 2017. -His tumor marker CEA has been trending up, we'll continue monitoring -His clinically doing well, tolerating concurrent chemotherapy FOLFIRI and panitumumab very well, we'll continue for now. -We discussed that if he has significant disease progression on CT scan, we will change his treatment regimen. -Today's lab results still  pending, if adequate,  we'll proceed chemo today   2. Grade 1-2 skin rashes, stable  -Secondary to panitumumab, stable overall  -continue hydrocortisone 2.5%, and clindamycin gel 1%  twice daily as needed  -He knows to avoid sun exposure, and call me if it gets worse.  3. Gastric ulcer with significant GI bleeding in April and Aug 2016 -He is on PPI, continue once daily  -Repeat his EGD on 08/15/2015 showed near complete healing of his gastric ulcer -continue Nadolol 12m bid, per Dr. SFuller Plan- I encourage him to follow-up with Dr. SFuller [facility] since he has restarted 5-fu   4. Type 2 DM  -His glucose level has increased lately, I encouraged him to watch his diet, avoid any sweets, and monitor closely at home -I encourage him to follow-up with his primary care physician Dr. WJimmye [place]  5. HTN, Dilated nonischemic ischemia cardiomyopathy with EF 25% -He is clinically doing well without symptoms of CHF. However this is probably going to impact his chemotherapy.Will try to avoid cardiotoxic chemotherapy agent and avoid fluid overload during chemotherapy. -Continue follow-up with cardiology. -will hold on his lisinopril-HCTZ for now, his BP has been normal lately   6 Hepatitis B carrier, with mild portal hypertension  -Per liver clinic, no need for treatment. Follow-up with Dr. SSilvio Pate  7. Malnutrition -I encouraged him to eat more, and take supplements as needed. -improved, gained  weight back after I reduce his chemotherapy dose  -follow up with Dietitian   8. Anemia secondary to GI bleeding, iron deficiency and chemo  -Repeat lab on 06/06/2015 showed ferritin 117, serum iron 24, saturation 8%, which supports iron deficiency -He received IV Feraheme again in Aug 2016 after GI bleeding  -Repeat a ferritin was 273 on 10/03/2015, much improved  -he received iv feraheme again on 11/27/2015 and 12/04/2015, but anemia did not improve much. -His anemia has been getting worse  lately, probably related to chemotherapy, we'll close monitor any signs of GI bleeding. -Blood transfusion if hemoglobin less than 8 or symptomatic anemia with hemoglobin 8-9  9. hypomagnesemia  -secondary to panitumumab -he receives IV mag 6 g every week on Wednesdays -He is taking magnesium pill 2 tablets 3 times a day -follow up weekly    Plan -today's scan reviewed with pt and his son -His CBC from today still pending, CMP unremarkable, we'll proceed FOLFIRI and panitumumab today if blood counts are adequate and continue every 2 weeks.  -magnesium infusion weekly on Wednesday  -I'll see him back in 2 weeks. We'll postpone his chemotherapy in 4 weeks to 5 weeks due to the Thanksgiving holiday.  All questions were answered. The patient knows to call the clinic with any problems, questions or concerns.  I spent 25 minutes counseling the patient face to face. The total time spent in the appointment was 30 minutes and more than 50% was on counseling.     Truitt Merle, MD 08/19/16

## 2016-08-19 NOTE — Progress Notes (Signed)
Magnesium 0.7, okay to treat per [name].

## 2016-08-21 ENCOUNTER — Other Ambulatory Visit: Payer: Self-pay

## 2016-08-21 ENCOUNTER — Ambulatory Visit (HOSPITAL_BASED_OUTPATIENT_CLINIC_OR_DEPARTMENT_OTHER): Payer: PPO

## 2016-08-21 VITALS — BP 147/71 | HR 55 | Temp 98.3°F | Resp 18

## 2016-08-21 DIAGNOSIS — C187 Malignant neoplasm of sigmoid colon: Secondary | ICD-10-CM | POA: Diagnosis not present

## 2016-08-21 DIAGNOSIS — D508 Other iron deficiency anemias: Secondary | ICD-10-CM

## 2016-08-21 DIAGNOSIS — C189 Malignant neoplasm of colon, unspecified: Secondary | ICD-10-CM

## 2016-08-21 DIAGNOSIS — C787 Secondary malignant neoplasm of liver and intrahepatic bile duct: Secondary | ICD-10-CM

## 2016-08-21 DIAGNOSIS — D509 Iron deficiency anemia, unspecified: Secondary | ICD-10-CM

## 2016-08-21 DIAGNOSIS — Z95828 Presence of other vascular implants and grafts: Secondary | ICD-10-CM

## 2016-08-21 MED ORDER — HEPARIN SOD (PORK) LOCK FLUSH 100 UNIT/ML IV SOLN
500.0000 [IU] | Freq: Once | INTRAVENOUS | Status: AC | PRN
  Administered 2016-08-21: 500 [IU] via INTRAVENOUS
  Filled 2016-08-21: qty 5

## 2016-08-21 MED ORDER — SODIUM CHLORIDE 0.9 % IJ SOLN
10.0000 mL | INTRAMUSCULAR | Status: DC | PRN
  Administered 2016-08-21: 10 mL
  Filled 2016-08-21: qty 10

## 2016-08-21 MED ORDER — HEPARIN SOD (PORK) LOCK FLUSH 100 UNIT/ML IV SOLN
500.0000 [IU] | Freq: Once | INTRAVENOUS | Status: DC | PRN
  Filled 2016-08-21: qty 5

## 2016-08-21 MED ORDER — SODIUM CHLORIDE 0.9 % IV SOLN: 6.0000 g | Freq: Once | INTRAVENOUS | Status: DC

## 2016-08-21 MED ORDER — MAGNESIUM OXIDE 400 (241.3 MG) MG PO TABS
400.0000 mg | ORAL_TABLET | Freq: Three times a day (TID) | ORAL | 3 refills | Status: AC
Start: 2016-08-21 — End: ?

## 2016-08-21 MED ORDER — SODIUM CHLORIDE 0.9 % IV SOLN
Freq: Once | INTRAVENOUS | Status: AC
  Administered 2016-08-21: 13:00:00 via INTRAVENOUS
  Filled 2016-08-21: qty 250

## 2016-08-21 MED ORDER — FOLIC ACID 1 MG PO TABS: 1.0000 mg | ORAL_TABLET | Freq: Every day | ORAL | 3 refills | Status: AC

## 2016-08-21 MED ORDER — PROCHLORPERAZINE MALEATE 10 MG PO TABS: 10.0000 mg | ORAL_TABLET | Freq: Four times a day (QID) | ORAL | 2 refills | Status: DC | PRN

## 2016-08-21 MED ORDER — SODIUM CHLORIDE 0.9 % IJ SOLN
10.0000 mL | INTRAMUSCULAR | Status: DC | PRN
  Filled 2016-08-21: qty 10

## 2016-08-21 NOTE — Patient Instructions (Signed)
Magnesium Sulfate injection What is this medicine? MAGNESIUM SULFATE (mag NEE zee um SUL fate) is an electrolyte injection commonly used to treat low magnesium levels in your blood and to prevent or control certain seizures. This medicine may be used for other purposes; ask your health care provider or pharmacist if you have questions. What should I tell my health care provider before I take this medicine? They need to know if you have any of these conditions: -heart disease -history of irregular heart beat -kidney disease -an unusual or allergic reaction to magnesium sulfate, medicines, foods, dyes, or preservatives -pregnant or trying to get pregnant -breast-feeding How should I use this medicine? This medicine is for infusion into a vein. It is given by a health care professional in a hospital or clinic setting. Talk to your pediatrician regarding the use of this medicine in children. While this drug may be prescribed for selected conditions, precautions do apply. Overdosage: If you think you have taken too much of this medicine contact a poison control center or emergency room at once. NOTE: This medicine is only for you. Do not share this medicine with others. What if I miss a dose? This does not apply. What may interact with this medicine? This medicine may interact with the following medications: -certain medicines for anxiety or sleep -certain medicines for seizures like phenobarbital -digoxin -medicines that relax muscles for surgery -narcotic medicines for pain This list may not describe all possible interactions. Give your health care provider a list of all the medicines, herbs, non-prescription drugs, or dietary supplements you use. Also tell them if you smoke, drink alcohol, or use illegal drugs. Some items may interact with your medicine. What should I watch for while using this medicine? Your condition will be monitored carefully while you are receiving this medicine. You  may need blood work done while you are receiving this medicine. What side effects may I notice from receiving this medicine? Side effects that you should report to your doctor or health care professional as soon as possible: -allergic reactions like skin rash, itching or hives, swelling of the face, lips, or tongue -facial flushing -muscle weakness -signs and symptoms of low blood pressure like dizziness; feeling faint or lightheaded, falls; unusually weak or tired -signs and symptoms of a dangerous change in heartbeat or heart rhythm like chest pain; dizziness; fast or irregular heartbeat; palpitations; breathing problems -sweating This list may not describe all possible side effects. Call your doctor for medical advice about side effects. You may report side effects to FDA at 1-800-FDA-1088. Where should I keep my medicine? This drug is given in a hospital or clinic and will not be stored at home. NOTE: This sheet is a summary. It may not cover all possible information. If you have questions about this medicine, talk to your doctor, pharmacist, or health care provider.    2016, Elsevier/Gold Standard. (2013-02-22 10:35:11)

## 2016-08-28 ENCOUNTER — Ambulatory Visit (HOSPITAL_BASED_OUTPATIENT_CLINIC_OR_DEPARTMENT_OTHER): Payer: Medicare Other

## 2016-08-28 VITALS — BP 101/55 | HR 76 | Temp 98.2°F | Resp 18

## 2016-08-28 DIAGNOSIS — Z95828 Presence of other vascular implants and grafts: Secondary | ICD-10-CM

## 2016-08-28 DIAGNOSIS — D509 Iron deficiency anemia, unspecified: Secondary | ICD-10-CM

## 2016-08-28 MED ORDER — SODIUM CHLORIDE 0.9 % IJ SOLN
10.0000 mL | INTRAMUSCULAR | Status: DC | PRN
  Administered 2016-08-28: 10 mL via INTRAVENOUS
  Filled 2016-08-28: qty 10

## 2016-08-28 MED ORDER — HEPARIN SOD (PORK) LOCK FLUSH 100 UNIT/ML IV SOLN
500.0000 [IU] | Freq: Once | INTRAVENOUS | Status: AC | PRN
  Administered 2016-08-28: 500 [IU] via INTRAVENOUS
  Filled 2016-08-28: qty 5

## 2016-08-28 MED ORDER — SODIUM CHLORIDE 0.9 % IV SOLN
Freq: Once | INTRAVENOUS | Status: AC
  Administered 2016-08-28: 15:00:00 via INTRAVENOUS
  Filled 2016-08-28: qty 250

## 2016-08-28 NOTE — Patient Instructions (Signed)
Magnesium Sulfate injection What is this medicine? MAGNESIUM SULFATE (mag NEE zee um SUL fate) is an electrolyte injection commonly used to treat low magnesium levels in your blood and to prevent or control certain seizures. This medicine may be used for other purposes; ask your health care provider or pharmacist if you have questions. What should I tell my health care provider before I take this medicine? They need to know if you have any of these conditions: -heart disease -history of irregular heart beat -kidney disease -an unusual or allergic reaction to magnesium sulfate, medicines, foods, dyes, or preservatives -pregnant or trying to get pregnant -breast-feeding How should I use this medicine? This medicine is for infusion into a vein. It is given by a health care professional in a hospital or clinic setting. Talk to your pediatrician regarding the use of this medicine in children. While this drug may be prescribed for selected conditions, precautions do apply. Overdosage: If you think you have taken too much of this medicine contact a poison control center or emergency room at once. NOTE: This medicine is only for you. Do not share this medicine with others. What if I miss a dose? This does not apply. What may interact with this medicine? This medicine may interact with the following medications: -certain medicines for anxiety or sleep -certain medicines for seizures like phenobarbital -digoxin -medicines that relax muscles for surgery -narcotic medicines for pain This list may not describe all possible interactions. Give your health care provider a list of all the medicines, herbs, non-prescription drugs, or dietary supplements you use. Also tell them if you smoke, drink alcohol, or use illegal drugs. Some items may interact with your medicine. What should I watch for while using this medicine? Your condition will be monitored carefully while you are receiving this medicine. You  may need blood work done while you are receiving this medicine. What side effects may I notice from receiving this medicine? Side effects that you should report to your doctor or health care professional as soon as possible: -allergic reactions like skin rash, itching or hives, swelling of the face, lips, or tongue -facial flushing -muscle weakness -signs and symptoms of low blood pressure like dizziness; feeling faint or lightheaded, falls; unusually weak or tired -signs and symptoms of a dangerous change in heartbeat or heart rhythm like chest pain; dizziness; fast or irregular heartbeat; palpitations; breathing problems -sweating This list may not describe all possible side effects. Call your doctor for medical advice about side effects. You may report side effects to FDA at 1-800-FDA-1088. Where should I keep my medicine? This drug is given in a hospital or clinic and will not be stored at home. NOTE: This sheet is a summary. It may not cover all possible information. If you have questions about this medicine, talk to your doctor, pharmacist, or health care provider.    2016, Elsevier/Gold Standard. (2013-02-22 10:35:11)

## 2016-09-01 NOTE — Progress Notes (Signed)
[place]  [phone] [phone]  Clinic follow up Note   Patient Care Team: Jason Junior, MD as PCP - General (Specialist) Jason Reilly, CRNP as Nurse Practitioner (Nurse Practitioner) Jason Merle, MD as Consulting Physician (Hematology) Jason Artist, MD as Consulting Physician (Gastroenterology) 09/02/2016  CHIEF COMPLAINTS Follow up metastatic sigmoid colon cancer  Oncology History   He is a Metastatic colon cancer to liver   Staging form: Colon and Rectum, AJCC 7th Edition     Clinical: Stage Unknown ([place], NX, M1) - Unsigned      Cancer of sigmoid colon ([place])   10/28/2014 Tumor Marker    AFP 3.2 CEA > 10,000 CA 19-9 12,929.6. tumor (-) KRAS and NRAS mutation.       11/10/2014 Imaging    PET scan showed hypermetabolic mass in the sigmoid colon was noted metastasis in the retroperitoneum. Probable left adrenal and pulmonary metastasis, and diffuse liver metastasis.      11/21/2014 Initial Diagnosis    Metastatic colon cancer to liver, lung, abd nodes and left adrenal gland. Diagnosis was made by liver biopsy.       11/30/2014 - 05/23/2015 Chemotherapy    First line chemo [id number], Panitumumab added from second cycle, chemo held due to recurrent GI bleeding        12/28/2014 - 12/31/2014 Hospital Admission    Was admitted for dehydration, neutropenia fever with UTI, and severe skin rash.      02/08/2015 - 02/12/2015 Hospital Admission    He was admitted for upper GI bleeding, e.g. showed a gastric ulcer with clots, status post appendectomy injection. He also received a blood transfusion.      03/01/2015 Tumor Marker    CEA 694, CA19.9 462      03/13/2015 Imaging    CT CAP showed partial response, no new lesions.       05/31/2015 - 06/02/2015 Hospital Admission    He was admitted to [facility] in [place] due to upper GI bleeding, EGD showed gastric and dudenal ulcers       06/27/2015 - 06/29/2015 Hospital Admission    he was admitted  for dairrhea and pancolitis, c-diff and stool cultures were negative, treated with antibiotics      07/12/2015 -  Chemotherapy    panitumumab 24m/kg, every 2 weeks      09/19/2015 -  Chemotherapy    Irinotecan 1893mm2 every 2 weeks, dose reduction due to diarrhea. 5-Fu added on 06/10/2016 due to slight disease progression       11/08/2015 - 11/10/2015 Hospital Admission    Pt was admitted for sepsis and hypotension, was found to have (+) influenza A and treated.       11/30/2015 Imaging     CT scans reviewed continued improvement in the liver metastasis, no other new lesions.      05/06/2016 Tumor Marker    CEA has gradually increased from 110 in March 2017 to 566 in July 2068      06/04/2016 Imaging    Restaging CT scan showed similar to mild increase in hepatic metastasis, mild enlargement of right adrenal nodule, primary similar pulmonary nodules. A left upper lobe nodule has resolved.        HISTORY OF PRESENTING ILLNESS:  Jason Reilly [age].o. male is here because of abnormal CT findings, which is very suspicious for malignancy. [place] He came in with his son  and an interpreter.  He has been feeling fatigued since two month ago. He is still able to do all ADLs. He otherwise denies any pain, bloating or nausea.  He lost about 20lbs in 3 month. His appetite is lower than before, eats less, no change of his bowl habits.  She denied any hematochezia or melana. Per his son, he has had some personality changes daily, irritable, slightly confused some time.  He was evaluated by his primary care physician. Lab test reviewed hepatitis B infection, which he did not know before, and elevated alkaline phosphatase, his liver function and the rest of the liver function was not remarkable. [place] of abdomen was obtained on 07/22/2014, which showed diffusely abnormal liver with multiple echogenic lesions. CT of abdomen with and without contrast  was done on 08/26/2014, which reviewed here at a medically with multiple large partially calcified hepatic masses consistent with metastatic disease. Mild retroperitoneal adenopathy with the largest node measuring 1.6 cm. And nonspecific 1.4 cm left adrenal nodule was also noticed. His tumor marker showed CEA greater than 10,000, CA 19-9 12,929, AFP 3.2 (normal). He was referred to [facility] and was evaluated by nurse practitioner Jason Reilly. Treatment for hepatitis B was not recommended based on his virus load.  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 He was evaluated by a cardiologist in 2014. He denies any significant dyspnea on exertion. No leg swollen.  CURRENT THERAPY:  1. panitumumab 65m/kg, every 2 weeks, started on 07/12/2015 2. Irinotecan 1848mm2 every 2 weeks added on 09/19/2015, dose decreased to 16082m2 from cycle 2, and further decreased to 125m20m from cycle 10 due to diarrhea and anorexia, changed to 140mg10mfrom cycle 13. 3. 5-FU 1800mg/18mver 46 hours added back from 06/10/2016 due to mild disease progression 4. He also receives IV magnesium 6g weekly   INTERIM HISTORY: Jason Reilly for follow-up and chemotherapy. He is accompanied by his son to clinic today. He is doing well overall, tolerated chemotherapy well, no significant change lately. He denies any significant pain, nausea, diarrhea, or other symptoms. His skin rash on face and trunk are stable, he does use topical steroids and clindamycin gel every day. No fever or chills, his appetite and has level remains to be good. His weight is stable.   MEDICAL HISTORY:  Past Medical History:  Diagnosis Date  . Abnormal EKG   . Diabetes mellitus without complication (HCC)    metformin  . Gastric ulcer   . H. pylori infection   . Hepatitis B   . Hypertension   . Metastatic colon cancer to liver (HCC)  BeldingNon-ischemic cardiomyopathy (HCC)  [place] Tachycardia     SURGICAL HISTORY: Past Surgical History:  Procedure Laterality Date  . ESOPHAGOGASTRODUODENOSCOPY N/A 02/08/2015   Procedure: ESOPHAGOGASTRODUODENOSCOPY (EGD);  Surgeon: Jason Reilly [facility];  Service: Endoscopy;  Laterality: N/A;  . ESOPHAGOGASTRODUODENOSCOPY (EGD) WITH PROPOFOL N/A 08/15/2015   Procedure: ESOPHAGOGASTRODUODENOSCOPY (EGD) WITH PROPOFOL;  Surgeon: Jason Reilly Location: [facility];  Service: Endoscopy;  Laterality: N/A;  . LEFT AND RIGHT HEART CATHETERIZATION WITH CORONARY ANGIOGRAM N/A 03/29/2013   Procedure: LEFT AND RIGHT HEART CATHETERIZATION WITH CORONARY ANGIOGRAM;  Surgeon: [place] [facility];  Service: Cardiovascular;  Laterality: N/A;  . Nuclear Stress Test  03/03/2013   High risk - consistent with nonischemic cardiomyopathy    SOCIAL HISTORY: Social History   Social  History  . Marital status: Married    Spouse name: N/A  . Number of children: N/A  . Years of education: N/A   Occupational History  . Not on file.   Social History Main Topics  . Smoking status: Former Smoker    Types: Cigarettes    Quit date: 11/07/2008  . Smokeless tobacco: Never Used  . Alcohol use Yes     Comment: occasional  . Drug use: No  . Sexual activity: No   Other Topics Concern  . Not on file   Social History Narrative   Married   Enjoys walking   Has lived in [place] > 16 years    FAMILY HISTORY: No family history of liver disease or malignancy.  ALLERGIES:  has No Known Allergies.  MEDICATIONS:  Current Outpatient Prescriptions on File Prior to Visit  Medication Sig Dispense Refill  . clindamycin (CLINDAGEL) 1 % gel Apply topically 2 (two) times daily. 30 g 2  . diphenoxylate-atropine (LOMOTIL) 2.5-0.025 MG tablet Take 1-2 tablets by mouth 4 (four) times daily as needed for diarrhea or loose stools. 30 tablet 0  . folic acid (FOLVITE) 1 MG tablet Take 1 tablet (1 mg total) by mouth daily. 30  tablet 3  . hydrocortisone 1 % ointment Apply 1 application topically 2 (two) times daily. 56 g 0  . lidocaine-prilocaine (EMLA) cream Apply 1 application topically as needed. Apply to [facility] cath at least one hour before needle stick. 30 g 2  . lisinopril-hydrochlorothiazide (PRINZIDE,ZESTORETIC) 10-12.5 MG tablet     . loperamide (IMODIUM) 2 MG capsule Take 2 capsules (4 mg total) by mouth 4 (four) times daily as needed for diarrhea or loose stools (NO MORE THAN 8 TABLETS PER DAY.). 60 capsule 3  . magic mouthwash SOLN Take 5 mLs by mouth 4 (four) times daily. 120 mL 1  . magnesium oxide (MAG-OX) 400 (241.3 Mg) MG tablet Take 1 tablet (400 mg total) by mouth 3 (three) times daily. 90 tablet 3  . metFORMIN (GLUCOPHAGE) 500 MG tablet Take 1 tablet (500 mg total) by mouth 2 (two) times daily with a meal. 60 tablet 0  . nadolol (CORGARD) 20 MG tablet Take 1 tablet (20 mg total) by mouth 2 (two) times daily. THIS IS A ONE TIME ORDER.  FUTURE REFILLS NEED TO BE DONE BY PRIMARY MD. 60 tablet 1  . ondansetron (ZOFRAN) 8 MG tablet Take 1 tablet (8 mg total) by mouth every 8 (eight) hours as needed for nausea or vomiting. 45 tablet 2  . pantoprazole (PROTONIX) 40 MG tablet Take 1 tablet (40 mg total) by mouth daily. 30 tablet 2  . prochlorperazine (COMPAZINE) 10 MG tablet Take 1 tablet (10 mg total) by mouth every 6 (six) hours as needed for nausea or vomiting. 30 tablet 2   Current Facility-Administered Medications on File Prior to Visit  Medication Dose Route Frequency Provider Last Rate Last Dose  . 0.9 %  sodium chloride infusion   Intravenous Once Jason Merle, MD      . ferumoxytol [facility]) 510 mg in sodium chloride 0.9 % 100 mL IVPB  510 mg Intravenous Once Jason Merle, MD      . sodium chloride 0.9 % injection 10 mL  10 mL Intracatheter PRN Jason Merle, MD   10 mL at 10/03/15 1705  . sodium chloride 0.9 % injection 10 mL  10 mL Intravenous PRN Jason Merle, MD   10 mL at 05/14/16 1127  ;   REVIEW OF  SYSTEMS:   Constitutional: Denies fevers, chills or abnormal night sweats, (+) fatigue  Eyes: Denies blurriness of vision, double vision or watery eyes Ears, nose, mouth, throat, and face: Denies mucositis or sore throat Respiratory: Denies cough, dyspnea or wheezes Cardiovascular: Denies palpitation, chest discomfort or lower extremity swelling Gastrointestinal: Denies nausea, heartburn or change in bowel habits Skin: Denies abnormal skin rashes Lymphatics: Denies new lymphadenopathy or easy bruising Neurological:Denies numbness, tingling or new weaknesses Behavioral/Psych: Mood is stable, no new changes, (+) insomnia All other systems were reviewed with the patient and are negative.  PHYSICAL EXAMINATION: BP (!) 112/59 (BP Location: Left Arm, Patient Position: Sitting)   Pulse 68   Temp 98.2 F (36.8 C) (Oral)   Resp 18   Ht '5\' 5"'  (1.651 m)   Wt 143 lb 6.4 oz (65 kg)   SpO2 98%   BMI 23.86 kg/m   ECOG PERFORMANCE STATUS: 1 Vital sign were taken at in the infusion room, within normal limits. GENERAL:alert, no distress and comfortable SKIN: (+) Dry skin,  no skin ulcer, (+) scatter skin rashes on his neck and upper chest, some are resolving with skin pigmentation. No discharge. EYES: normal, conjunctiva are pink and non-injected, sclera clear OROPHARYNX:no exudate, no erythema and lips, buccal mucosa, and tongue with mild discoloration at the tip.  NECK: supple, thyroid normal size, non-tender, without nodularity LYMPH:  no palpable lymphadenopathy in the cervical, axillary or inguinal LUNGS: clear to auscultation and percussion with normal breathing effort, no rales   HEART: regular rate & rhythm and no murmurs and no lower extremity edema ABDOMEN:abdomen soft, non-tender, no hepatomegaly, no splenomegaly and normal bowel sounds Musculoskeletal:no cyanosis of digits and no clubbing  PSYCH: alert & oriented x 3 with fluent speech NEURO: no focal motor/sensory  deficits  LABORATORY DATA:  I have reviewed the data as listed CBC Latest Ref Rng & Units 09/02/2016 08/19/2016 08/05/2016  WBC 4.0 - 10.3 10e3/uL 3.8(L) 4.6 3.1(L)  Hemoglobin 13.0 - 17.1 g/dL 9.0(L) 9.5(L) 9.1(L)  Hematocrit 38.4 - 49.9 % 27.8(L) 29.1(L) 28.4(L)  Platelets 140 - 400 10e3/uL 133(L) 176 144    CMP Latest Ref Rng & Units 09/02/2016 08/19/2016 08/05/2016  Glucose 70 - 140 mg/dl 269(H) 122 153(H)  BUN 7.0 - 26.0 mg/dL 12.5 6.8(L) 5.8(L)  Creatinine 0.7 - 1.3 mg/dL 0.8 0.7 0.8  Sodium 136 - 145 mEq/L 140 142 143  Potassium 3.5 - 5.1 mEq/L 3.9 3.9 3.6  Chloride 101 - 111 mmol/L - - -  CO2 22 - 29 mEq/L '23 23 24  ' Calcium 8.4 - 10.4 mg/dL 8.1(L) 7.8(L) 8.2(L)  Total Protein 6.4 - 8.3 g/dL 6.8 7.3 7.0  Total Bilirubin 0.20 - 1.20 mg/dL 0.47 0.64 0.47  Alkaline Phos 40 - 150 U/L 188(H) 204(H) 179(H)  AST 5 - 34 U/L 31 33 23  ALT 0 - 55 U/L '17 24 16    ' INITIAL tumor markers AFP 3.2 CEA > 10,000 CA 19-9 12,929.6  Results for ARVEL, OQUINN (MRN [id number]) as of 08/19/2016 06:33  Ref. Range 07/08/2016 10:05 08/05/2016 09:12  CEA Latest Ref Range: 0.0 - 4.7 ng/mL 716.6 (H)   CEA ([facility]) Latest Ref Range: 0.00 - 5.00 ng/mL 834.19 (H) 1,102.80 (H)    Pathology report  Liver, needle/core biopsy - METASTATIC ADENOCARCINOMA, SEE COMMENT. Microscopic Comment The adenocarcinoma demonstrates the following immunophenotype: Cytokeratin 7 - negative expression. Cytokeratin 20 - strong diffuse expression. CD2 - strong diffuse expression. Overall the morphology and immunophenotype are that of metastatic  adenocarcinoma primary to colorectum. The recent nuclear medicine scan demonstrating sigmoid mass with associated liver masses is noted.  [facility] test result:    RADIOGRAPHIC STUDIES: I have personally reviewed the outside CT scan image with patient and his son.   CT chest, abdomen and pelvis with IV contrast on  08/19/2016  IMPRESSION: 1. Respiratory motion  degrades image quality in the lungs. Favor slight enlargement of a right lower lobe pulmonary nodule as well as development of a new pulmonary nodule in the left upper lobe. 2. Hepatic metastatic disease is grossly stable. Associated marginal irregularity of the liver is likely due to hepatic fibrosis from treated metastatic disease, with evidence of portal hypertension. 3. Right adrenal nodule is stable to minimally enlarged. 4. Aortic atherosclerosis (ICD10-170.0). Coronary artery calcification.   ASSESSMENT & PLAN:  70 year old [place] male, with past history of hypertension and dilated nonischemic gammopathy with EF 25%, no clinical signs of heart failure, who was found to have hepatitis B infection lately, and multiple liver lesions on the CT scan. He has extremely high CEA and CA 19-9 levels. PET scan reviewed a hypermetabolic sigmoid colon mass, diffuse liver metastasis, probable lung and adrenal gland metastasis.  1. Metastatic sigmoid colon cancer, with diffuse liver, lungs, node and left adrenal gland metastases. KRAS/NRAS wild type, MSI-stable -Liver biopsy showed metastatic adenocarcinoma. His tumor were strongly positive for CK20 and CD2, consistent with primary colorectal primary. KRAS and NRAS mutations were not detected.  -Pt understands that this is incurable cancer, and he has very high disease burden and overall prognosis is poor. The treatment goal is palliative -I discussed his restaging CT from 08/19/2016 which showed overall stable disease in liver, a few small lung nodules, slightly bigger, will continue monitoring. -I think his scan showed overall some improvement since we restarted on 5-FU in August 2017. -His tumor marker CEA has been trending up, we'll continue monitoring -His clinically doing well, tolerating concurrent chemotherapy FOLFIRI and panitumumab very well, we'll continue for now. -We discussed that if he has significant disease progression on CT scan, we  will change his treatment regimen. -Lab reviewed, adequate for treatment, we'll proceed to chemotherapy today   2. Grade 1-2 skin rashes, stable  -Secondary to panitumumab, stable overall  -continue hydrocortisone 2.5%, and clindamycin gel 1%  twice daily as needed  -He knows to avoid sun exposure, and call me if it gets worse.  3. Gastric ulcer with significant GI bleeding in April and Aug 2016 -He is on PPI, continue once daily  -Repeat his EGD on 08/15/2015 showed near complete healing of his gastric ulcer -continue Nadolol 35m bid, per Dr. SFuller Plan- I encourage him to follow-up [facility] has restarted 5-fu   4. Type 2 DM  -His glucose level has increased lately, I encouraged him to watch his diet, avoid any sweets, and monitor closely at home -I encourage him to follow-up with his primary care physician Dr. WJimmye [place]  5. HTN, Dilated nonischemic ischemia cardiomyopathy with EF 25% -He is clinically doing well without symptoms of CHF. However this is probably going to impact his chemotherapy.Will try to avoid cardiotoxic chemotherapy agent and avoid fluid overload during chemotherapy. -Continue follow-up with cardiology. -will hold on his lisinopril-HCTZ for now, his BP has been normal lately   6 Hepatitis B carrier, with mild portal hypertension  -Per liver clinic, no need for treatment. Follow-up with Dr. SSilvio Pate  7. Malnutrition -I encouraged him to eat more,  and take supplements as needed. -improved, gained weight back after I reduce his chemotherapy dose  -follow up with Dietitian   8. Anemia secondary to GI bleeding, iron deficiency and chemo  -Repeat lab on 06/06/2015 showed ferritin 117, serum iron 24, saturation 8%, which supports iron deficiency -He received IV Feraheme again in Aug 2016 after GI bleeding  -Repeat a ferritin was 273 on 10/03/2015, much improved  -he received iv feraheme again on 11/27/2015 and 12/04/2015, but  anemia did not improve much. -His anemia has been slightly worse lately, probably related to chemotherapy, we'll close monitor any signs of GI bleeding. -Blood transfusion if hemoglobin less than 8 or symptomatic anemia with hemoglobin 8-9  9. hypomagnesemia  -secondary to panitumumab -he receives IV mag 6 g every week on Wednesdays -He is taking magnesium pill 2 tablets 3 times a day -follow up weekly    Plan -Lab reviewed, adequate for treatment, we'll proceed FOLFIRI and panitumumab today and continue every 2 weeks.  -magnesium infusion weekly on Wednesday  -I'll see him back in 3 weeks. We'll postpone his next cycle chemo for one week due to the Thanksgiving holiday  All questions were answered. The patient knows to call the clinic with any problems, questions or concerns.  I spent 25 minutes counseling the patient face to face. The total time spent in the appointment was 30 minutes and more than 50% was on counseling.     Jason Merle, MD 09/02/16

## 2016-09-02 ENCOUNTER — Other Ambulatory Visit (HOSPITAL_BASED_OUTPATIENT_CLINIC_OR_DEPARTMENT_OTHER): Payer: Medicare Other

## 2016-09-02 ENCOUNTER — Ambulatory Visit (HOSPITAL_BASED_OUTPATIENT_CLINIC_OR_DEPARTMENT_OTHER): Payer: Medicare Other | Admitting: Hematology

## 2016-09-02 ENCOUNTER — Ambulatory Visit (HOSPITAL_BASED_OUTPATIENT_CLINIC_OR_DEPARTMENT_OTHER): Payer: Medicare Other

## 2016-09-02 ENCOUNTER — Encounter: Payer: Self-pay | Admitting: Hematology

## 2016-09-02 ENCOUNTER — Ambulatory Visit (HOSPITAL_BASED_OUTPATIENT_CLINIC_OR_DEPARTMENT_OTHER): Payer: PPO

## 2016-09-02 VITALS — BP 112/59 | HR 68 | Temp 98.2°F | Resp 18 | Ht 65.0 in | Wt 143.4 lb

## 2016-09-02 DIAGNOSIS — Z452 Encounter for adjustment and management of vascular access device: Secondary | ICD-10-CM

## 2016-09-02 DIAGNOSIS — C7972 Secondary malignant neoplasm of left adrenal gland: Secondary | ICD-10-CM

## 2016-09-02 DIAGNOSIS — C787 Secondary malignant neoplasm of liver and intrahepatic bile duct: Secondary | ICD-10-CM | POA: Diagnosis not present

## 2016-09-02 DIAGNOSIS — Z5112 Encounter for antineoplastic immunotherapy: Secondary | ICD-10-CM

## 2016-09-02 DIAGNOSIS — C187 Malignant neoplasm of sigmoid colon: Secondary | ICD-10-CM

## 2016-09-02 DIAGNOSIS — Z5111 Encounter for antineoplastic chemotherapy: Secondary | ICD-10-CM | POA: Diagnosis not present

## 2016-09-02 DIAGNOSIS — C7800 Secondary malignant neoplasm of unspecified lung: Secondary | ICD-10-CM

## 2016-09-02 DIAGNOSIS — D6481 Anemia due to antineoplastic chemotherapy: Secondary | ICD-10-CM

## 2016-09-02 DIAGNOSIS — E119 Type 2 diabetes mellitus without complications: Secondary | ICD-10-CM

## 2016-09-02 DIAGNOSIS — I5042 Chronic combined systolic (congestive) and diastolic (congestive) heart failure: Secondary | ICD-10-CM

## 2016-09-02 DIAGNOSIS — I10 Essential (primary) hypertension: Secondary | ICD-10-CM

## 2016-09-02 DIAGNOSIS — Z95828 Presence of other vascular implants and grafts: Secondary | ICD-10-CM

## 2016-09-02 DIAGNOSIS — E46 Unspecified protein-calorie malnutrition: Secondary | ICD-10-CM

## 2016-09-02 DIAGNOSIS — D509 Iron deficiency anemia, unspecified: Secondary | ICD-10-CM

## 2016-09-02 LAB — FERRITIN: FERRITIN: 587 ng/mL — AB (ref 22–316)

## 2016-09-02 LAB — CBC WITH DIFFERENTIAL/PLATELET
BASO%: 0.5 % (ref 0.0–2.0)
Basophils Absolute: 0 10*3/uL (ref 0.0–0.1)
EOS ABS: 0.1 10*3/uL (ref 0.0–0.5)
EOS%: 2.4 % (ref 0.0–7.0)
HCT: 27.8 % — ABNORMAL LOW (ref 38.4–49.9)
HEMOGLOBIN: 9 g/dL — AB (ref 13.0–17.1)
LYMPH%: 17.7 % (ref 14.0–49.0)
MCH: 27.5 pg (ref 27.2–33.4)
MCHC: 32.4 g/dL (ref 32.0–36.0)
MCV: 85 fL (ref 79.3–98.0)
MONO#: 0.4 10*3/uL (ref 0.1–0.9)
MONO%: 9.8 % (ref 0.0–14.0)
NEUT%: 69.6 % (ref 39.0–75.0)
NEUTROS ABS: 2.6 10*3/uL (ref 1.5–6.5)
Platelets: 133 10*3/uL — ABNORMAL LOW (ref 140–400)
RBC: 3.27 10*6/uL — AB (ref 4.20–5.82)
RDW: 17.2 % — AB (ref 11.0–14.6)
WBC: 3.8 10*3/uL — AB (ref 4.0–10.3)
lymph#: 0.7 10*3/uL — ABNORMAL LOW (ref 0.9–3.3)

## 2016-09-02 LAB — COMPREHENSIVE METABOLIC PANEL
ALT: 17 U/L (ref 0–55)
ANION GAP: 9 meq/L (ref 3–11)
AST: 31 U/L (ref 5–34)
Albumin: 3 g/dL — ABNORMAL LOW (ref 3.5–5.0)
Alkaline Phosphatase: 188 U/L — ABNORMAL HIGH (ref 40–150)
BUN: 12.5 mg/dL (ref 7.0–26.0)
CHLORIDE: 108 meq/L (ref 98–109)
CO2: 23 meq/L (ref 22–29)
Calcium: 8.1 mg/dL — ABNORMAL LOW (ref 8.4–10.4)
Creatinine: 0.8 mg/dL (ref 0.7–1.3)
Glucose: 269 mg/dl — ABNORMAL HIGH (ref 70–140)
Potassium: 3.9 mEq/L (ref 3.5–5.1)
Sodium: 140 mEq/L (ref 136–145)
Total Bilirubin: 0.47 mg/dL (ref 0.20–1.20)
Total Protein: 6.8 g/dL (ref 6.4–8.3)

## 2016-09-02 LAB — MAGNESIUM: MAGNESIUM: 0.6 mg/dL — AB (ref 1.5–2.5)

## 2016-09-02 LAB — CEA (IN HOUSE-CHCC)

## 2016-09-02 MED ORDER — DEXAMETHASONE SODIUM PHOSPHATE 10 MG/ML IJ SOLN
10.0000 mg | Freq: Once | INTRAMUSCULAR | Status: AC
  Administered 2016-09-02: 10 mg via INTRAVENOUS

## 2016-09-02 MED ORDER — SODIUM CHLORIDE 0.9 % IV SOLN
140.0000 mg/m2 | Freq: Once | INTRAVENOUS | Status: AC
  Administered 2016-09-02: 240 mg via INTRAVENOUS
  Filled 2016-09-02: qty 10

## 2016-09-02 MED ORDER — ATROPINE SULFATE 1 MG/ML IJ SOLN
0.5000 mg | Freq: Once | INTRAMUSCULAR | Status: AC | PRN
  Administered 2016-09-02: 0.5 mg via INTRAVENOUS

## 2016-09-02 MED ORDER — ONDANSETRON HCL 8 MG PO TABS
ORAL_TABLET | ORAL | Status: AC
  Filled 2016-09-02: qty 1

## 2016-09-02 MED ORDER — ATROPINE SULFATE 1 MG/ML IJ SOLN
INTRAMUSCULAR | Status: AC
  Filled 2016-09-02: qty 1

## 2016-09-02 MED ORDER — SODIUM CHLORIDE 0.9 % IV SOLN
Freq: Once | INTRAVENOUS | Status: AC
  Administered 2016-09-02: 11:00:00 via INTRAVENOUS

## 2016-09-02 MED ORDER — SODIUM CHLORIDE 0.9 % IV SOLN
1800.0000 mg/m2 | INTRAVENOUS | Status: DC
  Administered 2016-09-02: 3050 mg via INTRAVENOUS
  Filled 2016-09-02: qty 61

## 2016-09-02 MED ORDER — SODIUM CHLORIDE 0.9 % IV SOLN
6.3000 mg/kg | Freq: Once | INTRAVENOUS | Status: AC
  Administered 2016-09-02: 400 mg via INTRAVENOUS
  Filled 2016-09-02: qty 20

## 2016-09-02 MED ORDER — DEXAMETHASONE SODIUM PHOSPHATE 10 MG/ML IJ SOLN
INTRAMUSCULAR | Status: AC
  Filled 2016-09-02: qty 1

## 2016-09-02 MED ORDER — ONDANSETRON HCL 8 MG PO TABS
8.0000 mg | ORAL_TABLET | Freq: Once | ORAL | Status: AC
  Administered 2016-09-02: 8 mg via ORAL

## 2016-09-02 MED ORDER — SODIUM CHLORIDE 0.9 % IJ SOLN
10.0000 mL | INTRAMUSCULAR | Status: DC | PRN
  Administered 2016-09-02: 10 mL via INTRAVENOUS
  Filled 2016-09-02: qty 10

## 2016-09-02 NOTE — Patient Instructions (Signed)

## 2016-09-02 NOTE — Patient Instructions (Signed)
[place] Discharge Instructions for Patients Receiving Chemotherapy  Today you received the following chemotherapy agents; Vectibix, Camptosar and Adrucil.   To help prevent nausea and vomiting after your treatment, we encourage you to take your nausea medication as directed.    If you develop nausea and vomiting that is not controlled by your nausea medication, call the clinic.   BELOW ARE SYMPTOMS THAT SHOULD BE REPORTED IMMEDIATELY:  *FEVER GREATER THAN 100.5 F  *CHILLS WITH OR WITHOUT FEVER  NAUSEA AND VOMITING THAT IS NOT CONTROLLED WITH YOUR NAUSEA MEDICATION  *UNUSUAL SHORTNESS OF BREATH  *UNUSUAL BRUISING OR BLEEDING  TENDERNESS IN MOUTH AND THROAT WITH OR WITHOUT PRESENCE OF ULCERS  *URINARY PROBLEMS  *BOWEL PROBLEMS  UNUSUAL RASH Items with * indicate a potential emergency and should be followed up as soon as possible.  Feel free to call the clinic you have any questions or concerns. The clinic phone number is [phone].  Please show the [facility] at check-in to the Emergency Department and triage nurse.

## 2016-09-04 ENCOUNTER — Ambulatory Visit (HOSPITAL_BASED_OUTPATIENT_CLINIC_OR_DEPARTMENT_OTHER): Payer: Medicare Other

## 2016-09-04 VITALS — BP 142/76 | HR 56 | Temp 98.3°F | Resp 18

## 2016-09-04 DIAGNOSIS — I10 Essential (primary) hypertension: Secondary | ICD-10-CM | POA: Diagnosis not present

## 2016-09-04 DIAGNOSIS — R7309 Other abnormal glucose: Secondary | ICD-10-CM | POA: Diagnosis not present

## 2016-09-04 DIAGNOSIS — Z0000 Encounter for general adult medical examination without abnormal findings: Secondary | ICD-10-CM | POA: Diagnosis not present

## 2016-09-04 DIAGNOSIS — D509 Iron deficiency anemia, unspecified: Secondary | ICD-10-CM

## 2016-09-04 DIAGNOSIS — C187 Malignant neoplasm of sigmoid colon: Secondary | ICD-10-CM

## 2016-09-04 DIAGNOSIS — E11610 Type 2 diabetes mellitus with diabetic neuropathic arthropathy: Secondary | ICD-10-CM | POA: Diagnosis not present

## 2016-09-04 MED ORDER — SODIUM CHLORIDE 0.9 % IJ SOLN
10.0000 mL | INTRAMUSCULAR | Status: DC | PRN
  Administered 2016-09-04: 10 mL
  Filled 2016-09-04: qty 10

## 2016-09-04 MED ORDER — HEPARIN SOD (PORK) LOCK FLUSH 100 UNIT/ML IV SOLN
500.0000 [IU] | Freq: Once | INTRAVENOUS | Status: AC | PRN
  Administered 2016-09-04: 500 [IU]
  Filled 2016-09-04: qty 5

## 2016-09-04 MED ORDER — SODIUM CHLORIDE 0.9 % IV SOLN
Freq: Once | INTRAVENOUS | Status: AC
  Administered 2016-09-04: 14:00:00 via INTRAVENOUS
  Filled 2016-09-04: qty 250

## 2016-09-04 NOTE — Patient Instructions (Addendum)
Hypomagnesemia °Hypomagnesemia is a condition in which the level of magnesium in the blood is low. Magnesium is a mineral that is found in many foods. It is used in many different processes in the body. Hypomagnesemia can affect every organ in the body. It can cause life-threatening problems. °CAUSES °Causes of hypomagnesemia include: °· Not getting enough magnesium in your diet. °· Malnutrition. °· Problems with absorbing magnesium from the intestines. °· Dehydration. °· Alcohol abuse. °· Vomiting. °· Severe diarrhea. °· Some medicines, including medicines that make you urinate more. °· Certain diseases, such as kidney disease, diabetes, and overactive thyroid. °SIGNS AND SYMPTOMS °· Involuntary shaking or trembling of a body part (tremor). °· Confusion. °· Muscle weakness. °· Sensitivity to light, sound, and touch. °· Psychiatric issues, such as depression, irritability, or psychosis. °· Sudden tightening of muscles (muscle spasms). °· Tingling in the arms and legs. °· A feeling of fluttering of the heart. °These symptoms are more severe if magnesium levels drop suddenly. °DIAGNOSIS °To make a diagnosis, your health care provider will do a physical exam and order blood and urine tests. °TREATMENT °Treatment will depend on the cause and the severity of your condition. It may involve: °· A magnesium supplement. This can be taken in pill form. It can also be given through an IV tube. This is usually done if the condition is severe. °· Changes to your diet. You may be directed to eat foods that have a lot of magnesium, such as green leafy vegetables, peas, beans, and nuts. °· Eliminating alcohol from your diet. °HOME CARE INSTRUCTIONS °· Include foods with magnesium in your diet. Foods that are rich in magnesium include green vegetables, beans, nuts and seeds, and whole grains. °· Take medicines only as directed by your health care provider. °· Take magnesium supplements if your health care provider instructs you to  do that. Take them as directed. °· Have your magnesium levels monitored as directed by your health care provider. °· When you are active, drink fluids that contain electrolytes. °· Keep all follow-up visits as directed by your health care provider. This is important. °SEEK MEDICAL CARE IF: °· You get worse instead of better. °· Your symptoms return. °SEEK IMMEDIATE MEDICAL CARE IF: °· Your symptoms are severe. °  °This information is not intended to replace advice given to you by your health care provider. Make sure you discuss any questions you have with your health care provider. °  °Document Released: 07/10/2005 Document Revised: 11/04/2014 Document Reviewed: 05/30/2014 °Elsevier Interactive Patient Education ©2016 [employer] ° °

## 2016-09-11 ENCOUNTER — Ambulatory Visit (HOSPITAL_BASED_OUTPATIENT_CLINIC_OR_DEPARTMENT_OTHER): Payer: Medicare Other

## 2016-09-11 VITALS — BP 103/44 | HR 76 | Temp 98.6°F | Resp 18

## 2016-09-11 DIAGNOSIS — Z95828 Presence of other vascular implants and grafts: Secondary | ICD-10-CM

## 2016-09-11 DIAGNOSIS — D509 Iron deficiency anemia, unspecified: Secondary | ICD-10-CM

## 2016-09-11 MED ORDER — SODIUM CHLORIDE 0.9 % IJ SOLN
10.0000 mL | INTRAMUSCULAR | Status: DC | PRN
  Administered 2016-09-11: 10 mL via INTRAVENOUS
  Filled 2016-09-11: qty 10

## 2016-09-11 MED ORDER — HEPARIN SOD (PORK) LOCK FLUSH 100 UNIT/ML IV SOLN
500.0000 [IU] | Freq: Once | INTRAVENOUS | Status: AC | PRN
  Administered 2016-09-11: 500 [IU] via INTRAVENOUS
  Filled 2016-09-11: qty 5

## 2016-09-11 MED ORDER — SODIUM CHLORIDE 0.9 % IV SOLN
Freq: Once | INTRAVENOUS | Status: AC
  Administered 2016-09-11: 14:00:00 via INTRAVENOUS
  Filled 2016-09-11: qty 250

## 2016-09-11 NOTE — Patient Instructions (Signed)
Hypomagnesemia °Hypomagnesemia is a condition in which the level of magnesium in the blood is low. Magnesium is a mineral that is found in many foods. It is used in many different processes in the body. Hypomagnesemia can affect every organ in the body. It can cause life-threatening problems. °CAUSES °Causes of hypomagnesemia include: °· Not getting enough magnesium in your diet. °· Malnutrition. °· Problems with absorbing magnesium from the intestines. °· Dehydration. °· Alcohol abuse. °· Vomiting. °· Severe diarrhea. °· Some medicines, including medicines that make you urinate more. °· Certain diseases, such as kidney disease, diabetes, and overactive thyroid. °SIGNS AND SYMPTOMS °· Involuntary shaking or trembling of a body part (tremor). °· Confusion. °· Muscle weakness. °· Sensitivity to light, sound, and touch. °· Psychiatric issues, such as depression, irritability, or psychosis. °· Sudden tightening of muscles (muscle spasms). °· Tingling in the arms and legs. °· A feeling of fluttering of the heart. °These symptoms are more severe if magnesium levels drop suddenly. °DIAGNOSIS °To make a diagnosis, your health care provider will do a physical exam and order blood and urine tests. °TREATMENT °Treatment will depend on the cause and the severity of your condition. It may involve: °· A magnesium supplement. This can be taken in pill form. It can also be given through an IV tube. This is usually done if the condition is severe. °· Changes to your diet. You may be directed to eat foods that have a lot of magnesium, such as green leafy vegetables, peas, beans, and nuts. °· Eliminating alcohol from your diet. °HOME CARE INSTRUCTIONS °· Include foods with magnesium in your diet. Foods that are rich in magnesium include green vegetables, beans, nuts and seeds, and whole grains. °· Take medicines only as directed by your health care provider. °· Take magnesium supplements if your health care provider instructs you to  do that. Take them as directed. °· Have your magnesium levels monitored as directed by your health care provider. °· When you are active, drink fluids that contain electrolytes. °· Keep all follow-up visits as directed by your health care provider. This is important. °SEEK MEDICAL CARE IF: °· You get worse instead of better. °· Your symptoms return. °SEEK IMMEDIATE MEDICAL CARE IF: °· Your symptoms are severe. °  °This information is not intended to replace advice given to you by your health care provider. Make sure you discuss any questions you have with your health care provider. °  °Document Released: 07/10/2005 Document Revised: 11/04/2014 Document Reviewed: 05/30/2014 °Elsevier Interactive Patient Education ©2016 [employer] ° °

## 2016-09-18 NOTE — Progress Notes (Signed)
Jason Reilly  [phone] [phone]  Clinic follow up Note   Patient Care Team: [name], MD as PCP - General (Specialist) Jason Reilly, CRNP as Nurse Practitioner (Nurse Practitioner) Truitt Merle, MD as Consulting Physician (Hematology) Ladene Artist, MD as Consulting Physician (Gastroenterology) 09/23/2016  CHIEF COMPLAINTS Follow up metastatic sigmoid colon cancer  Oncology History   He is a Metastatic colon cancer to liver   Staging form: Colon and Rectum, AJCC 7th Edition     Clinical: Stage Unknown ([place], NX, M1) - Unsigned      Cancer of sigmoid colon ([place])   10/28/2014 Tumor Marker    AFP 3.2 CEA > 10,000 CA 19-9 12,929.6. tumor (-) KRAS and NRAS mutation.       11/10/2014 Imaging    PET scan showed hypermetabolic mass in the sigmoid colon was noted metastasis in the retroperitoneum. Probable left adrenal and pulmonary metastasis, and diffuse liver metastasis.      11/21/2014 Initial Diagnosis    Metastatic colon cancer to liver, lung, abd nodes and left adrenal gland. Diagnosis was made by liver biopsy.       11/30/2014 - 05/23/2015 Chemotherapy    First line chemo [id number], Panitumumab added from second cycle, chemo held due to recurrent GI bleeding        12/28/2014 - 12/31/2014 Hospital Admission    Was admitted for dehydration, neutropenia fever with UTI, and severe skin rash.      02/08/2015 - 02/12/2015 Hospital Admission    He was admitted for upper GI bleeding, e.g. showed a gastric ulcer with clots, status post appendectomy injection. He also received a blood transfusion.      03/01/2015 Tumor Marker    CEA 694, CA19.9 462      03/13/2015 Imaging    CT CAP showed partial response, no new lesions.       05/31/2015 - 06/02/2015 Hospital Admission    He was admitted to [facility] in [place] due to upper GI bleeding, EGD showed gastric and dudenal ulcers       06/27/2015 - 06/29/2015 Hospital Admission    he was  admitted for dairrhea and pancolitis, c-diff and stool cultures were negative, treated with antibiotics      07/12/2015 -  Chemotherapy    panitumumab 67m/kg, every 2 weeks      09/19/2015 -  Chemotherapy    Irinotecan 1861mm2 every 2 weeks, dose reduction due to diarrhea. 5-Fu added on 06/10/2016 due to slight disease progression       11/08/2015 - 11/10/2015 Hospital Admission    Pt was admitted for sepsis and hypotension, was found to have (+) influenza A and treated.       11/30/2015 Imaging     CT scans reviewed continued improvement in the liver metastasis, no other new lesions.      05/06/2016 Tumor Marker    CEA has gradually increased from 110 in March 2017 to 566 in July 2070      06/04/2016 Imaging    Restaging CT scan showed similar to mild increase in hepatic metastasis, mild enlargement of right adrenal nodule, primary similar pulmonary nodules. A left upper lobe nodule has resolved.        HISTORY OF PRESENTING ILLNESS:  Jason Reilly [age].o. male is here because of abnormal CT findings, which is very suspicious for malignancy. [place] He came in with his son  and an interpreter.  He has been feeling fatigued since two month ago. He is still able to do all ADLs. He otherwise denies any pain, bloating or nausea.  He lost about 20lbs in 3 month. His appetite is lower than before, eats less, no change of his bowl habits.  She denied any hematochezia or melana. Per his son, he has had some personality changes daily, irritable, slightly confused some time.  He was evaluated by his primary care physician. Lab test reviewed hepatitis B infection, which he did not know before, and elevated alkaline phosphatase, his liver function and the rest of the liver function was not remarkable. [place] of abdomen was obtained on 07/22/2014, which showed diffusely abnormal liver with multiple echogenic lesions. CT of abdomen with and without  contrast was done on 08/26/2014, which reviewed here at a medically with multiple large partially calcified hepatic masses consistent with metastatic disease. Mild retroperitoneal adenopathy with the largest node measuring 1.6 cm. And nonspecific 1.4 cm left adrenal nodule was also noticed. His tumor marker showed CEA greater than 10,000, CA 19-9 12,929, AFP 3.2 (normal). He was referred to [facility] and was evaluated by nurse practitioner Jason Reilly. Treatment for hepatitis B was not recommended based on his virus load.  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 He was evaluated by a cardiologist in 2014. He denies any significant dyspnea on exertion. No leg swollen.  CURRENT THERAPY:  1. panitumumab 70m/kg, every 2 weeks, started on 07/12/2015 2. Irinotecan 1839mm2 every 2 weeks added on 09/19/2015, dose decreased to 16025m2 from cycle 2, and further decreased to 125m66m from cycle 10 due to diarrhea and anorexia, changed to 140mg73mfrom cycle 13. 3. 5-FU 1800mg/55mver 46 hours added back from 06/10/2016 due to mild disease progression 4. He also receives IV magnesium 6g weekly   INTERIM HISTORY: Jason Reilly [name] for follow-up and chemotherapy. He is doing well overall. He has been tolerating chemotherapy well without significant side effects. He had one-week break last week due to the holiday. Skin rashes stable, no other new complaints. He has good appetite, and his weight is stable.   MEDICAL HISTORY:  Past Medical History:  Diagnosis Date  . Abnormal EKG   . Diabetes mellitus without complication (HCC)    metformin  . Gastric ulcer   . H. pylori infection   . Hepatitis B   . Hypertension   . Metastatic colon cancer to liver (HCC)  [place] cardiomyopathy (HCC)  [place]     SURGICAL HISTORY: Past Surgical History:  Procedure Laterality Date  . ESOPHAGOGASTRODUODENOSCOPY N/A 02/08/2015    Procedure: ESOPHAGOGASTRODUODENOSCOPY (EGD);  Surgeon: MalcolLadene Artist [facility];  Service: Endoscopy;  Laterality: N/A;  . ESOPHAGOGASTRODUODENOSCOPY (EGD) WITH PROPOFOL N/A 08/15/2015   Procedure: ESOPHAGOGASTRODUODENOSCOPY (EGD) WITH PROPOFOL;  Surgeon: MalcolLadene Artist Location: [facility];  Service: Endoscopy;  Laterality: N/A;  . LEFT AND RIGHT HEART CATHETERIZATION WITH CORONARY ANGIOGRAM N/A 03/29/2013   Procedure: LEFT AND RIGHT HEART CATHETERIZATION WITH CORONARY ANGIOGRAM;  Surgeon: [place] [facility];  Service: Cardiovascular;  Laterality: N/A;  . Nuclear Stress Test  03/03/2013   High risk - consistent with nonischemic cardiomyopathy    SOCIAL HISTORY: Social History   Social History  . Marital status: Married    Spouse name: N/A  . Number of children: N/A  . Years of education: N/A  Occupational History  . Not on file.   Social History Main Topics  . Smoking status: Former Smoker    Types: Cigarettes    Quit date: 11/07/2008  . Smokeless tobacco: Never Used  . Alcohol use Yes     Comment: occasional  . Drug use: No  . Sexual activity: No   Other Topics Concern  . Not on file   Social History Narrative   Married   Enjoys walking   Has lived in [place] > 16 years    FAMILY HISTORY: No family history of liver disease or malignancy.  ALLERGIES:  has No Known Allergies.  MEDICATIONS:  Current Outpatient Prescriptions on File Prior to Visit  Medication Sig Dispense Refill  . clindamycin (CLINDAGEL) 1 % gel Apply topically 2 (two) times daily. 30 g 2  . diphenoxylate-atropine (LOMOTIL) 2.5-0.025 MG tablet Take 1-2 tablets by mouth 4 (four) times daily as needed for diarrhea or loose stools. 30 tablet 0  . folic acid (FOLVITE) 1 MG tablet Take 1 tablet (1 mg total) by mouth daily. 30 tablet 3  . hydrocortisone 1 % ointment Apply 1 application topically 2 (two) times daily. 56 g 0  . lidocaine-prilocaine (EMLA) cream  Apply 1 application topically as needed. Apply to [facility] cath at least one hour before needle stick. 30 g 2  . lisinopril-hydrochlorothiazide (PRINZIDE,ZESTORETIC) 10-12.5 MG tablet     . loperamide (IMODIUM) 2 MG capsule Take 2 capsules (4 mg total) by mouth 4 (four) times daily as needed for diarrhea or loose stools (NO MORE THAN 8 TABLETS PER DAY.). 60 capsule 3  . magic mouthwash SOLN Take 5 mLs by mouth 4 (four) times daily. 120 mL 1  . magnesium oxide (MAG-OX) 400 (241.3 Mg) MG tablet Take 1 tablet (400 mg total) by mouth 3 (three) times daily. 90 tablet 3  . metFORMIN (GLUCOPHAGE) 500 MG tablet Take 1 tablet (500 mg total) by mouth 2 (two) times daily with a meal. 60 tablet 0  . nadolol (CORGARD) 20 MG tablet Take 1 tablet (20 mg total) by mouth 2 (two) times daily. THIS IS A ONE TIME ORDER.  FUTURE REFILLS NEED TO BE DONE BY PRIMARY MD. 60 tablet 1  . ondansetron (ZOFRAN) 8 MG tablet Take 1 tablet (8 mg total) by mouth every 8 (eight) hours as needed for nausea or vomiting. 45 tablet 2  . pantoprazole (PROTONIX) 40 MG tablet Take 1 tablet (40 mg total) by mouth daily. 30 tablet 2  . prochlorperazine (COMPAZINE) 10 MG tablet Take 1 tablet (10 mg total) by mouth every 6 (six) hours as needed for nausea or vomiting. 30 tablet 2   Current Facility-Administered Medications on File Prior to Visit  Medication Dose Route Frequency Provider Last Rate Last Dose  . 0.9 %  sodium chloride infusion   Intravenous Once Truitt Merle, MD      . ferumoxytol [facility]) 510 mg in sodium chloride 0.9 % 100 mL IVPB  510 mg Intravenous Once Truitt Merle, MD      . sodium chloride 0.9 % injection 10 mL  10 mL Intracatheter PRN Truitt Merle, MD   10 mL at 10/03/15 1705  . sodium chloride 0.9 % injection 10 mL  10 mL Intravenous PRN Truitt Merle, MD   10 mL at 05/14/16 1127  ;   REVIEW OF SYSTEMS:   Constitutional: Denies fevers, chills or abnormal night sweats, (+) fatigue  Eyes: Denies blurriness of vision, double vision or  watery eyes Ears,  nose, mouth, throat, and face: Denies mucositis or sore throat Respiratory: Denies cough, dyspnea or wheezes Cardiovascular: Denies palpitation, chest discomfort or lower extremity swelling Gastrointestinal: Denies nausea, heartburn or change in bowel habits Skin: Denies abnormal skin rashes Lymphatics: Denies new lymphadenopathy or easy bruising Neurological:Denies numbness, tingling or new weaknesses Behavioral/Psych: Mood is stable, no new changes, (+) insomnia All other systems were reviewed with the patient and are negative.  PHYSICAL EXAMINATION: BP 125/63 (BP Location: Left Arm, Patient Position: Sitting)   Pulse 68   Temp 97.9 F (36.6 C) (Oral)   Resp 17   Ht 5' 5" (1.651 m)   Wt 142 lb (64.4 kg)   SpO2 100%   BMI 23.63 kg/m   ECOG PERFORMANCE STATUS: 1 Vital sign were taken at in the infusion room, within normal limits. GENERAL:alert, no distress and comfortable SKIN: (+) Dry skin,  no skin ulcer, (+) scatter skin rashes on his neck and upper chest, some are resolving with skin pigmentation. No discharge. EYES: normal, conjunctiva are pink and non-injected, sclera clear OROPHARYNX:no exudate, no erythema and lips, buccal mucosa, and tongue with mild discoloration at the tip.  NECK: supple, thyroid normal size, non-tender, without nodularity LYMPH:  no palpable lymphadenopathy in the cervical, axillary or inguinal LUNGS: clear to auscultation and percussion with normal breathing effort, no rales   HEART: regular rate & rhythm and no murmurs and no lower extremity edema ABDOMEN:abdomen soft, non-tender, no hepatomegaly, no splenomegaly and normal bowel sounds Musculoskeletal:no cyanosis of digits and no clubbing  PSYCH: alert & oriented x 3 with fluent speech NEURO: no focal motor/sensory deficits   LABORATORY DATA:  I have reviewed the data as listed CBC Latest Ref Rng & Units 09/23/2016 09/02/2016 08/19/2016  WBC 4.0 - 10.3 10e3/uL 4.7 3.8(L) 4.6    Hemoglobin 13.0 - 17.1 g/dL 9.5(L) 9.0(L) 9.5(L)  Hematocrit 38.4 - 49.9 % 29.3(L) 27.8(L) 29.1(L)  Platelets 140 - 400 10e3/uL 163 133(L) 176    CMP Latest Ref Rng & Units 09/02/2016 08/19/2016 08/05/2016  Glucose 70 - 140 mg/dl 269(H) 122 153(H)  BUN 7.0 - 26.0 mg/dL 12.5 6.8(L) 5.8(L)  Creatinine 0.7 - 1.3 mg/dL 0.8 0.7 0.8  Sodium 136 - 145 mEq/L 140 142 143  Potassium 3.5 - 5.1 mEq/L 3.9 3.9 3.6  Chloride 101 - 111 mmol/L - - -  CO2 22 - 29 mEq/L _0 Calcium 8.4 - 10.4 mg/dL 8.1(L) 7.8(L) 8.2(L)  Total Protein 6.4 - 8.3 g/dL 6.8 7.3 7.0  Total Bilirubin 0.20 - 1.20 mg/dL 0.47 0.64 0.47  Alkaline Phos 40 - 150 U/L 188(H) 204(H) 179(H)  AST 5 - 34 U/L 31 33 23  ALT 0 - 55 U/L _1 INITIAL tumor markers AFP 3.2 CEA > 10,000 CA 19-9 12,929.6  Results for AGRON, SWINEY (MRN [id number]) as of 09/23/2016 10:13  Ref. Range 07/08/2016 10:05 08/05/2016 09:12 09/02/2016 08:57  CEA Latest Ref Range: 0.0 - 4.7 ng/mL 716.6 (H)    CEA ([facility]) Latest Ref Range: 0.00 - 5.00 ng/mL 834.19 (H) 1,102.80 (H) 1,286.28 (H)    Pathology report  Liver, needle/core biopsy - METASTATIC ADENOCARCINOMA, SEE COMMENT. Microscopic Comment The adenocarcinoma demonstrates the following immunophenotype: Cytokeratin 7 - negative expression. Cytokeratin 20 - strong diffuse expression. CD2 - strong diffuse expression. Overall the morphology and immunophenotype are that of metastatic adenocarcinoma primary to colorectum. The recent nuclear medicine scan demonstrating sigmoid mass with associated liver masses is noted.  [facility] test  result:    RADIOGRAPHIC STUDIES: I have personally reviewed the outside CT scan image with patient and his son.   CT chest, abdomen and pelvis with IV contrast on  08/19/2016  IMPRESSION: 1. Respiratory motion degrades image quality in the lungs. Favor slight enlargement of a right lower lobe pulmonary nodule as well as development of a new pulmonary  nodule in the left upper lobe. 2. Hepatic metastatic disease is grossly stable. Associated marginal irregularity of the liver is likely due to hepatic fibrosis from treated metastatic disease, with evidence of portal hypertension. 3. Right adrenal nodule is stable to minimally enlarged. 4. Aortic atherosclerosis (ICD10-170.0). Coronary artery calcification.   ASSESSMENT & PLAN:  69 year old [place] male, with past history of hypertension and dilated nonischemic gammopathy with EF 25%, no clinical signs of heart failure, who was found to have hepatitis B infection lately, and multiple liver lesions on the CT scan. He has extremely high CEA and CA 19-9 levels. PET scan reviewed a hypermetabolic sigmoid colon mass, diffuse liver metastasis, probable lung and adrenal gland metastasis.  1. Metastatic sigmoid colon cancer, with diffuse liver, lungs, node and left adrenal gland metastases. KRAS/NRAS wild type, MSI-stable -Liver biopsy showed metastatic adenocarcinoma. His tumor were strongly positive for CK20 and CD2, consistent with primary colorectal primary. KRAS and NRAS mutations were not detected.  -Pt understands that this is incurable cancer, and he has very high disease burden and overall prognosis is poor. The treatment goal is palliative -I previously discussed his restaging CT from 08/19/2016 which showed overall stable disease in liver, a few small lung nodules, slightly bigger, will continue monitoring. -I think his scan showed overall some improvement since we restarted on 5-FU in August 2017. -His tumor marker CEA has been trending up, concerning for slow disease progression, we'll continue monitoring -His clinically doing well, tolerating concurrent chemotherapy FOLFIRI and panitumumab very well, we'll continue for now. -Lab reviewed, adequate for treatment, we'll proceed to chemotherapy today -plan to repeat a staging CT scan in early January.    2. Grade 1-2 skin rashes, stable    -Secondary to panitumumab, stable overall  -continue hydrocortisone 2.5%, and clindamycin gel 1%  twice daily as needed  -He knows to avoid sun exposure, and call me if it gets worse.  3. Gastric ulcer with significant GI bleeding in April and Aug 2016 -He is on PPI, continue once daily  -Repeat his EGD on 08/15/2015 showed near complete healing of his gastric ulcer -continue Nadolol 59m bid, per Dr. SFuller Plan- I encourage him to follow-up with Dr[facility] since he has restarted 5-fu   4. Type 2 DM  -His glucose level has increased lately, I encouraged him to watch his diet, avoid any sweets, and monitor closely at home -I encourage him to follow-up with his primary care physician Dr. WJimmye [place]  5. HTN, Dilated nonischemic ischemia cardiomyopathy with EF 25% -He is clinically doing well without symptoms of CHF. However this is probably going to impact his chemotherapy.Will try to avoid cardiotoxic chemotherapy agent and avoid fluid overload during chemotherapy. -Continue follow-up with cardiology. -will hold on his lisinopril-HCTZ for now, his BP has been normal lately   6 Hepatitis B carrier, with mild portal hypertension  -Per liver clinic, no need for treatment. Follow-up with Dr. SSilvio Pate  7. Malnutrition -I encouraged him to eat more, and take supplements as needed. -improved, gained weight back after I reduce his chemotherapy dose  -follow up with Dietitian  8. Anemia secondary to GI bleeding, iron deficiency and chemo  -Repeat lab on 06/06/2015 showed ferritin 117, serum iron 24, saturation 8%, which supports iron deficiency -He received IV Feraheme again in Aug 2016 after GI bleeding  -Repeat a ferritin was 273 on 10/03/2015, much improved  -he received iv feraheme again on 11/27/2015 and 12/04/2015, but anemia did not improve much. -His anemia has been slightly worse lately, probably related to chemotherapy, we'll close monitor any signs of GI  bleeding. -Blood transfusion if hemoglobin less than 8 or symptomatic anemia with hemoglobin 8-9  9. hypomagnesemia  -secondary to panitumumab -he receives IV mag 6 g every week on Wednesdays -He is taking magnesium pill 2 tablets 3 times a day -follow up weekly    Plan -Lab reviewed, adequate for treatment, we'll proceed FOLFIRI and panitumumab today and continue every 2 weeks.  -magnesium infusion weekly on Wednesday  -I'll see him back in 4 weeks. Will order restaging CT scan on next visit  All questions were answered. The patient knows to call the clinic with any problems, questions or concerns.  I spent 20 minutes counseling the patient face to face. The total time spent in the appointment was 25 minutes and more than 50% was on counseling.     Truitt Merle, MD 09/23/16

## 2016-09-20 ENCOUNTER — Other Ambulatory Visit: Payer: Self-pay | Admitting: *Deleted

## 2016-09-20 DIAGNOSIS — C187 Malignant neoplasm of sigmoid colon: Secondary | ICD-10-CM

## 2016-09-23 ENCOUNTER — Ambulatory Visit (HOSPITAL_BASED_OUTPATIENT_CLINIC_OR_DEPARTMENT_OTHER): Payer: Medicare Other | Admitting: Hematology

## 2016-09-23 ENCOUNTER — Other Ambulatory Visit (HOSPITAL_BASED_OUTPATIENT_CLINIC_OR_DEPARTMENT_OTHER): Payer: Medicare Other

## 2016-09-23 ENCOUNTER — Ambulatory Visit (HOSPITAL_BASED_OUTPATIENT_CLINIC_OR_DEPARTMENT_OTHER): Payer: Medicare Other

## 2016-09-23 ENCOUNTER — Ambulatory Visit: Payer: Medicare Other

## 2016-09-23 ENCOUNTER — Encounter: Payer: Self-pay | Admitting: Hematology

## 2016-09-23 VITALS — BP 125/63 | HR 68 | Temp 97.9°F | Resp 17 | Ht 65.0 in | Wt 142.0 lb

## 2016-09-23 DIAGNOSIS — C787 Secondary malignant neoplasm of liver and intrahepatic bile duct: Secondary | ICD-10-CM | POA: Diagnosis not present

## 2016-09-23 DIAGNOSIS — Z452 Encounter for adjustment and management of vascular access device: Secondary | ICD-10-CM | POA: Diagnosis not present

## 2016-09-23 DIAGNOSIS — C187 Malignant neoplasm of sigmoid colon: Secondary | ICD-10-CM

## 2016-09-23 DIAGNOSIS — D6481 Anemia due to antineoplastic chemotherapy: Secondary | ICD-10-CM

## 2016-09-23 DIAGNOSIS — C7800 Secondary malignant neoplasm of unspecified lung: Secondary | ICD-10-CM

## 2016-09-23 DIAGNOSIS — E119 Type 2 diabetes mellitus without complications: Secondary | ICD-10-CM

## 2016-09-23 DIAGNOSIS — C792 Secondary malignant neoplasm of skin: Secondary | ICD-10-CM | POA: Diagnosis not present

## 2016-09-23 DIAGNOSIS — D509 Iron deficiency anemia, unspecified: Secondary | ICD-10-CM

## 2016-09-23 DIAGNOSIS — I10 Essential (primary) hypertension: Secondary | ICD-10-CM

## 2016-09-23 DIAGNOSIS — Z95828 Presence of other vascular implants and grafts: Secondary | ICD-10-CM

## 2016-09-23 DIAGNOSIS — C772 Secondary and unspecified malignant neoplasm of intra-abdominal lymph nodes: Secondary | ICD-10-CM

## 2016-09-23 DIAGNOSIS — I5042 Chronic combined systolic (congestive) and diastolic (congestive) heart failure: Secondary | ICD-10-CM

## 2016-09-23 LAB — CBC WITH DIFFERENTIAL/PLATELET
BASO%: 0.6 % (ref 0.0–2.0)
Basophils Absolute: 0 10*3/uL (ref 0.0–0.1)
EOS%: 5.1 % (ref 0.0–7.0)
Eosinophils Absolute: 0.2 10*3/uL (ref 0.0–0.5)
HEMATOCRIT: 29.3 % — AB (ref 38.4–49.9)
HGB: 9.5 g/dL — ABNORMAL LOW (ref 13.0–17.1)
LYMPH#: 0.8 10*3/uL — AB (ref 0.9–3.3)
LYMPH%: 17.1 % (ref 14.0–49.0)
MCH: 27.3 pg (ref 27.2–33.4)
MCHC: 32.4 g/dL (ref 32.0–36.0)
MCV: 84.2 fL (ref 79.3–98.0)
MONO#: 0.5 10*3/uL (ref 0.1–0.9)
MONO%: 9.6 % (ref 0.0–14.0)
NEUT%: 67.6 % (ref 39.0–75.0)
NEUTROS ABS: 3.2 10*3/uL (ref 1.5–6.5)
PLATELETS: 163 10*3/uL (ref 140–400)
RBC: 3.48 10*6/uL — AB (ref 4.20–5.82)
RDW: 17.1 % — ABNORMAL HIGH (ref 11.0–14.6)
WBC: 4.7 10*3/uL (ref 4.0–10.3)

## 2016-09-23 LAB — COMPREHENSIVE METABOLIC PANEL
ALT: 18 U/L (ref 0–55)
AST: 28 U/L (ref 5–34)
Albumin: 3.3 g/dL — ABNORMAL LOW (ref 3.5–5.0)
Alkaline Phosphatase: 220 U/L — ABNORMAL HIGH (ref 40–150)
Anion Gap: 9 mEq/L (ref 3–11)
BUN: 10.2 mg/dL (ref 7.0–26.0)
CALCIUM: 9.1 mg/dL (ref 8.4–10.4)
CHLORIDE: 106 meq/L (ref 98–109)
CO2: 24 mEq/L (ref 22–29)
CREATININE: 0.8 mg/dL (ref 0.7–1.3)
EGFR: >90 mL/min/{1.73_m2} (ref >90)
GLUCOSE: 152 mg/dL — AB (ref 70–140)
Potassium: 4.4 mEq/L (ref 3.5–5.1)
SODIUM: 139 meq/L (ref 136–145)
Total Bilirubin: 0.51 mg/dL (ref 0.20–1.20)
Total Protein: 7.6 g/dL (ref 6.4–8.3)

## 2016-09-23 LAB — MAGNESIUM: Magnesium: 0.9 mg/dl — CL (ref 1.5–2.5)

## 2016-09-23 MED ORDER — ONDANSETRON HCL 8 MG PO TABS
8.0000 mg | ORAL_TABLET | Freq: Once | ORAL | Status: AC
  Administered 2016-09-23: 8 mg via ORAL

## 2016-09-23 MED ORDER — ATROPINE SULFATE 1 MG/ML IJ SOLN
0.5000 mg | Freq: Once | INTRAMUSCULAR | Status: AC | PRN
  Administered 2016-09-23: 0.5 mg via INTRAVENOUS

## 2016-09-23 MED ORDER — SODIUM CHLORIDE 0.9 % IV SOLN
Freq: Once | INTRAVENOUS | Status: AC
  Administered 2016-09-23: 11:00:00 via INTRAVENOUS

## 2016-09-23 MED ORDER — SODIUM CHLORIDE 0.9 % IV SOLN
1800.0000 mg/m2 | INTRAVENOUS | Status: DC
  Administered 2016-09-23: 3050 mg via INTRAVENOUS
  Filled 2016-09-23: qty 61

## 2016-09-23 MED ORDER — SODIUM CHLORIDE 0.9 % IJ SOLN
10.0000 mL | INTRAMUSCULAR | Status: DC | PRN
  Administered 2016-09-23: 10 mL via INTRAVENOUS
  Filled 2016-09-23: qty 10

## 2016-09-23 MED ORDER — DEXAMETHASONE SODIUM PHOSPHATE 10 MG/ML IJ SOLN
10.0000 mg | Freq: Once | INTRAMUSCULAR | Status: AC
  Administered 2016-09-23: 10 mg via INTRAVENOUS

## 2016-09-23 MED ORDER — ONDANSETRON HCL 8 MG PO TABS
ORAL_TABLET | ORAL | Status: AC
  Filled 2016-09-23: qty 1

## 2016-09-23 MED ORDER — IRINOTECAN HCL CHEMO INJECTION 100 MG/5ML
140.0000 mg/m2 | Freq: Once | INTRAVENOUS | Status: AC
  Administered 2016-09-23: 240 mg via INTRAVENOUS
  Filled 2016-09-23: qty 10

## 2016-09-23 MED ORDER — DEXAMETHASONE SODIUM PHOSPHATE 10 MG/ML IJ SOLN
INTRAMUSCULAR | Status: AC
  Filled 2016-09-23: qty 1

## 2016-09-23 MED ORDER — ATROPINE SULFATE 1 MG/ML IJ SOLN
INTRAMUSCULAR | Status: AC
  Filled 2016-09-23: qty 1

## 2016-09-23 MED ORDER — SODIUM CHLORIDE 0.9 % IV SOLN
6.2000 mg/kg | Freq: Once | INTRAVENOUS | Status: AC
  Administered 2016-09-23: 400 mg via INTRAVENOUS
  Filled 2016-09-23: qty 20

## 2016-09-23 NOTE — Patient Instructions (Signed)
[facility] Discharge Instructions for Patients Receiving Chemotherapy  Today you received the following chemotherapy agents Vectibix, Camptosar and 5FU  To help prevent nausea and vomiting after your treatment, we encourage you to take your nausea medication.   If you develop nausea and vomiting that is not controlled by your nausea medication, call the clinic.   BELOW ARE SYMPTOMS THAT SHOULD BE REPORTED IMMEDIATELY:  *FEVER GREATER THAN 100.5 F  *CHILLS WITH OR WITHOUT FEVER  NAUSEA AND VOMITING THAT IS NOT CONTROLLED WITH YOUR NAUSEA MEDICATION  *UNUSUAL SHORTNESS OF BREATH  *UNUSUAL BRUISING OR BLEEDING  TENDERNESS IN MOUTH AND THROAT WITH OR WITHOUT PRESENCE OF ULCERS  *URINARY PROBLEMS  *BOWEL PROBLEMS  UNUSUAL RASH Items with * indicate a potential emergency and should be followed up as soon as possible.  Feel free to call the clinic you have any questions or concerns. The clinic phone number is [phone].  Please show the [facility] at check-in to the Emergency Department and triage nurse.

## 2016-09-25 ENCOUNTER — Ambulatory Visit (HOSPITAL_BASED_OUTPATIENT_CLINIC_OR_DEPARTMENT_OTHER): Payer: Medicare Other

## 2016-09-25 VITALS — BP 119/66 | HR 56 | Temp 98.6°F | Resp 16

## 2016-09-25 DIAGNOSIS — Z95828 Presence of other vascular implants and grafts: Secondary | ICD-10-CM

## 2016-09-25 DIAGNOSIS — D509 Iron deficiency anemia, unspecified: Secondary | ICD-10-CM

## 2016-09-25 MED ORDER — SODIUM CHLORIDE 0.9 % IV SOLN
Freq: Once | INTRAVENOUS | Status: AC
  Administered 2016-09-25: 14:00:00 via INTRAVENOUS
  Filled 2016-09-25: qty 250

## 2016-09-25 MED ORDER — HEPARIN SOD (PORK) LOCK FLUSH 100 UNIT/ML IV SOLN
500.0000 [IU] | Freq: Once | INTRAVENOUS | Status: AC | PRN
  Administered 2016-09-25: 500 [IU] via INTRAVENOUS
  Filled 2016-09-25: qty 5

## 2016-09-25 MED ORDER — SODIUM CHLORIDE 0.9 % IJ SOLN
10.0000 mL | INTRAMUSCULAR | Status: DC | PRN
  Administered 2016-09-25: 10 mL via INTRAVENOUS
  Filled 2016-09-25: qty 10

## 2016-10-04 ENCOUNTER — Telehealth: Payer: Self-pay | Admitting: Hematology

## 2016-10-04 NOTE — Telephone Encounter (Signed)
Message left on vm per appointments scheduled per 09/23/16 los.  Appointments scheduled per 09/23/16 los.  10/04/16

## 2016-10-07 ENCOUNTER — Other Ambulatory Visit (HOSPITAL_BASED_OUTPATIENT_CLINIC_OR_DEPARTMENT_OTHER): Payer: Medicare Other

## 2016-10-07 ENCOUNTER — Telehealth: Payer: Self-pay | Admitting: Hematology

## 2016-10-07 ENCOUNTER — Ambulatory Visit (HOSPITAL_BASED_OUTPATIENT_CLINIC_OR_DEPARTMENT_OTHER): Payer: Medicare Other | Admitting: Hematology

## 2016-10-07 ENCOUNTER — Ambulatory Visit (HOSPITAL_BASED_OUTPATIENT_CLINIC_OR_DEPARTMENT_OTHER): Payer: Medicare Other

## 2016-10-07 ENCOUNTER — Encounter: Payer: Self-pay | Admitting: Hematology

## 2016-10-07 VITALS — BP 126/48 | HR 62 | Temp 98.2°F | Resp 17 | Ht 65.0 in | Wt 143.9 lb

## 2016-10-07 DIAGNOSIS — Z5111 Encounter for antineoplastic chemotherapy: Secondary | ICD-10-CM

## 2016-10-07 DIAGNOSIS — C187 Malignant neoplasm of sigmoid colon: Secondary | ICD-10-CM | POA: Diagnosis not present

## 2016-10-07 DIAGNOSIS — C7972 Secondary malignant neoplasm of left adrenal gland: Secondary | ICD-10-CM | POA: Diagnosis not present

## 2016-10-07 DIAGNOSIS — D509 Iron deficiency anemia, unspecified: Secondary | ICD-10-CM

## 2016-10-07 DIAGNOSIS — Z5112 Encounter for antineoplastic immunotherapy: Secondary | ICD-10-CM | POA: Diagnosis not present

## 2016-10-07 DIAGNOSIS — C7800 Secondary malignant neoplasm of unspecified lung: Secondary | ICD-10-CM | POA: Diagnosis not present

## 2016-10-07 DIAGNOSIS — C787 Secondary malignant neoplasm of liver and intrahepatic bile duct: Secondary | ICD-10-CM

## 2016-10-07 DIAGNOSIS — I5042 Chronic combined systolic (congestive) and diastolic (congestive) heart failure: Secondary | ICD-10-CM

## 2016-10-07 DIAGNOSIS — I10 Essential (primary) hypertension: Secondary | ICD-10-CM

## 2016-10-07 DIAGNOSIS — C189 Malignant neoplasm of colon, unspecified: Secondary | ICD-10-CM

## 2016-10-07 DIAGNOSIS — E119 Type 2 diabetes mellitus without complications: Secondary | ICD-10-CM

## 2016-10-07 LAB — CBC WITH DIFFERENTIAL/PLATELET
BASO%: 0.7 % (ref 0.0–2.0)
BASOS ABS: 0 10*3/uL (ref 0.0–0.1)
EOS%: 3.5 % (ref 0.0–7.0)
Eosinophils Absolute: 0.2 10*3/uL (ref 0.0–0.5)
HEMATOCRIT: 30.2 % — AB (ref 38.4–49.9)
HEMOGLOBIN: 9.5 g/dL — AB (ref 13.0–17.1)
LYMPH#: 0.7 10*3/uL — AB (ref 0.9–3.3)
LYMPH%: 12.1 % — ABNORMAL LOW (ref 14.0–49.0)
MCH: 26.3 pg — AB (ref 27.2–33.4)
MCHC: 31.3 g/dL — ABNORMAL LOW (ref 32.0–36.0)
MCV: 84 fL (ref 79.3–98.0)
MONO#: 0.6 10*3/uL (ref 0.1–0.9)
MONO%: 10.1 % (ref 0.0–14.0)
NEUT%: 73.6 % (ref 39.0–75.0)
NEUTROS ABS: 4.3 10*3/uL (ref 1.5–6.5)
Platelets: 211 10*3/uL (ref 140–400)
RBC: 3.59 10*6/uL — ABNORMAL LOW (ref 4.20–5.82)
RDW: 18.8 % — AB (ref 11.0–14.6)
WBC: 5.8 10*3/uL (ref 4.0–10.3)

## 2016-10-07 LAB — COMPREHENSIVE METABOLIC PANEL
ALT: 24 U/L (ref 0–55)
AST: 28 U/L (ref 5–34)
Albumin: 3.3 g/dL — ABNORMAL LOW (ref 3.5–5.0)
Alkaline Phosphatase: 220 U/L — ABNORMAL HIGH (ref 40–150)
Anion Gap: 9 mEq/L (ref 3–11)
BILIRUBIN TOTAL: 0.59 mg/dL (ref 0.20–1.20)
BUN: 11.3 mg/dL (ref 7.0–26.0)
CO2: 26 meq/L (ref 22–29)
Calcium: 8.7 mg/dL (ref 8.4–10.4)
Chloride: 106 mEq/L (ref 98–109)
Creatinine: 0.8 mg/dL (ref 0.7–1.3)
EGFR: 90 mL/min/{1.73_m2} — ABNORMAL LOW (ref >90)
GLUCOSE: 106 mg/dL (ref 70–140)
Potassium: 4.4 mEq/L (ref 3.5–5.1)
Sodium: 141 mEq/L (ref 136–145)
TOTAL PROTEIN: 7.7 g/dL (ref 6.4–8.3)

## 2016-10-07 LAB — MAGNESIUM: Magnesium: 0.9 mg/dl — CL (ref 1.5–2.5)

## 2016-10-07 LAB — CEA (IN HOUSE-CHCC)

## 2016-10-07 MED ORDER — SODIUM CHLORIDE 0.9 % IV SOLN: Freq: Once | INTRAVENOUS | Status: DC

## 2016-10-07 MED ORDER — IRINOTECAN HCL CHEMO INJECTION 100 MG/5ML
140.0000 mg/m2 | Freq: Once | INTRAVENOUS | Status: AC
  Administered 2016-10-07: 240 mg via INTRAVENOUS
  Filled 2016-10-07: qty 10

## 2016-10-07 MED ORDER — DEXAMETHASONE SODIUM PHOSPHATE 10 MG/ML IJ SOLN
10.0000 mg | Freq: Once | INTRAMUSCULAR | Status: AC
  Administered 2016-10-07: 10 mg via INTRAVENOUS

## 2016-10-07 MED ORDER — DEXAMETHASONE SODIUM PHOSPHATE 10 MG/ML IJ SOLN
INTRAMUSCULAR | Status: AC
  Filled 2016-10-07: qty 1

## 2016-10-07 MED ORDER — ATROPINE SULFATE 1 MG/ML IJ SOLN
INTRAMUSCULAR | Status: AC
  Filled 2016-10-07: qty 1

## 2016-10-07 MED ORDER — ATROPINE SULFATE 1 MG/ML IJ SOLN
0.5000 mg | Freq: Once | INTRAMUSCULAR | Status: AC | PRN
  Administered 2016-10-07: 0.5 mg via INTRAVENOUS

## 2016-10-07 MED ORDER — ONDANSETRON HCL 8 MG PO TABS
8.0000 mg | ORAL_TABLET | Freq: Once | ORAL | Status: AC
  Administered 2016-10-07: 8 mg via ORAL

## 2016-10-07 MED ORDER — SODIUM CHLORIDE 0.9 % IV SOLN
6.2000 mg/kg | Freq: Once | INTRAVENOUS | Status: AC
  Administered 2016-10-07: 400 mg via INTRAVENOUS
  Filled 2016-10-07: qty 20

## 2016-10-07 MED ORDER — ONDANSETRON HCL 8 MG PO TABS
ORAL_TABLET | ORAL | Status: AC
  Filled 2016-10-07: qty 1

## 2016-10-07 MED ORDER — SODIUM CHLORIDE 0.9 % IV SOLN
Freq: Once | INTRAVENOUS | Status: AC
  Administered 2016-10-07: 13:00:00 via INTRAVENOUS

## 2016-10-07 MED ORDER — PROCHLORPERAZINE MALEATE 10 MG PO TABS: 10.0000 mg | ORAL_TABLET | Freq: Four times a day (QID) | ORAL | 2 refills | Status: DC | PRN

## 2016-10-07 MED ORDER — FLUOROURACIL CHEMO INJECTION 5 GM/100ML
1800.0000 mg/m2 | INTRAVENOUS | Status: DC
  Administered 2016-10-07: 3050 mg via INTRAVENOUS
  Filled 2016-10-07: qty 61

## 2016-10-07 NOTE — Progress Notes (Signed)
[facility]  [phone] [phone]  Clinic follow up Note   Patient Care Team: [name], MD as PCP - General (Specialist) [name], CRNP as Nurse Practitioner (Nurse Practitioner) Truitt Merle, MD as Consulting Physician (Hematology) Ladene Artist, MD as Consulting Physician (Gastroenterology) 10/07/2016  CHIEF COMPLAINTS Follow up metastatic sigmoid colon cancer  Oncology History   He is a Metastatic colon cancer to liver   Staging form: Colon and Rectum, AJCC 7th Edition     Clinical: Stage Unknown ([place], NX, M1) - Unsigned      Cancer of sigmoid colon ([place])   10/28/2014 Tumor Marker    AFP 3.2 CEA > 10,000 CA 19-9 12,929.6. tumor (-) KRAS and NRAS mutation.       11/10/2014 Imaging    PET scan showed hypermetabolic mass in the sigmoid colon was noted metastasis in the retroperitoneum. Probable left adrenal and pulmonary metastasis, and diffuse liver metastasis.      11/21/2014 Initial Diagnosis    Metastatic colon cancer to liver, lung, abd nodes and left adrenal gland. Diagnosis was made by liver biopsy.       11/30/2014 - 05/23/2015 Chemotherapy    First line chemo [id number], Panitumumab added from second cycle, chemo held due to recurrent GI bleeding        12/28/2014 - 12/31/2014 Hospital Admission    Was admitted for dehydration, neutropenia fever with UTI, and severe skin rash.      02/08/2015 - 02/12/2015 Hospital Admission    He was admitted for upper GI bleeding, e.g. showed a gastric ulcer with clots, status post appendectomy injection. He also received a blood transfusion.      03/01/2015 Tumor Marker    CEA 694, CA19.9 462      03/13/2015 Imaging    CT CAP showed partial response, no new lesions.       05/31/2015 - 06/02/2015 Hospital Admission    He was admitted to [facility] in [place] due to upper GI bleeding, EGD showed gastric and dudenal ulcers       06/27/2015 - 06/29/2015 Hospital Admission    he was  admitted for dairrhea and pancolitis, c-diff and stool cultures were negative, treated with antibiotics      07/12/2015 -  Chemotherapy    panitumumab 71m/kg, every 2 weeks      09/19/2015 -  Chemotherapy    Irinotecan 1823mm2 every 2 weeks, dose reduction due to diarrhea. 5-Fu added on 06/10/2016 due to slight disease progression       11/08/2015 - 11/10/2015 Hospital Admission    Pt was admitted for sepsis and hypotension, was found to have (+) influenza A and treated.       11/30/2015 Imaging     CT scans reviewed continued improvement in the liver metastasis, no other new lesions.      05/06/2016 Tumor Marker    CEA has gradually increased from 110 in March 2017 to 566 in July 2070      06/04/2016 Imaging    Restaging CT scan showed similar to mild increase in hepatic metastasis, mild enlargement of right adrenal nodule, primary similar pulmonary nodules. A left upper lobe nodule has resolved.        HISTORY OF PRESENTING ILLNESS:  [place] [age].o. male is here because of abnormal CT findings, which is very suspicious for malignancy. [place] He came in with his son  and an interpreter.  He has been feeling fatigued since two month ago. He is still able to do all ADLs. He otherwise denies any pain, bloating or nausea.  He lost about 20lbs in 3 month. His appetite is lower than before, eats less, no change of his bowl habits.  She denied any hematochezia or melana. Per his son, he has had some personality changes daily, irritable, slightly confused some time.  He was evaluated by his primary care physician. Lab test reviewed hepatitis B infection, which he did not know before, and elevated alkaline phosphatase, his liver function and the rest of the liver function was not remarkable. [place] of abdomen was obtained on 07/22/2014, which showed diffusely abnormal liver with multiple echogenic lesions. CT of abdomen with and without  contrast was done on 08/26/2014, which reviewed here at a medically with multiple large partially calcified hepatic masses consistent with metastatic disease. Mild retroperitoneal adenopathy with the largest node measuring 1.6 cm. And nonspecific 1.4 cm left adrenal nodule was also noticed. His tumor marker showed CEA greater than 10,000, CA 19-9 12,929, AFP 3.2 (normal). He was referred to [facility] and was evaluated by nurse practitioner [name]. Treatment for hepatitis B was not recommended based on his virus load.  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 He was evaluated by a cardiologist in 2014. He denies any significant dyspnea on exertion. No leg swollen.  CURRENT THERAPY:  1. panitumumab 80m/kg, every 2 weeks, started on 07/12/2015 2. Irinotecan 1814mm2 every 2 weeks added on 09/19/2015, dose decreased to 16069m2 from cycle 2, and further decreased to 125m20m from cycle 10 due to diarrhea and anorexia, changed to 140mg43mfrom cycle 13. 3. 5-FU 1800mg/15mver 46 hours added back from 06/10/2016 due to mild disease progression 4. He also receives IV magnesium 6g weekly   INTERIM HISTORY: Fed rJwanns for follow-up and chemotherapy. He had broken teeth and had 3-4 teeth removed one week ago, he had significant bleeding and swollen afterwards, he was not sure if his dentist gave him antibiotics, but anyway his symptoms hasve resolved, the wound has healed well, he is able to eat well now. His weight is stable. No other new complaints   MEDICAL HISTORY:  Past Medical History:  Diagnosis Date  . Abnormal EKG   . Diabetes mellitus without complication (HCC)    metformin  . Gastric ulcer   . H. pylori infection   . Hepatitis B   . Hypertension   . Metastatic colon cancer to liver (HCC)  [place]-ischemic cardiomyopathy (HCC)  [place]     SURGICAL HISTORY: Past Surgical History:  Procedure Laterality  Date  . ESOPHAGOGASTRODUODENOSCOPY N/A 02/08/2015   Procedure: ESOPHAGOGASTRODUODENOSCOPY (EGD);  Surgeon: MalcolLadene Artist [facility];  Service: Endoscopy;  Laterality: N/A;  . ESOPHAGOGASTRODUODENOSCOPY (EGD) WITH PROPOFOL N/A 08/15/2015   Procedure: ESOPHAGOGASTRODUODENOSCOPY (EGD) WITH PROPOFOL;  Surgeon: MalcolLadene Artist Location: [facility];  Service: Endoscopy;  Laterality: N/A;  . LEFT AND RIGHT HEART CATHETERIZATION WITH CORONARY ANGIOGRAM N/A 03/29/2013   Procedure: LEFT AND RIGHT HEART CATHETERIZATION WITH CORONARY ANGIOGRAM;  Surgeon: [place] [facility];  Service: Cardiovascular;  Laterality: N/A;  . Nuclear Stress Test  03/03/2013   High risk - consistent with nonischemic cardiomyopathy    SOCIAL HISTORY: Social History   Social History  . Marital status: Married    Spouse name: N/A  .  Number of children: N/A  . Years of education: N/A   Occupational History  . Not on file.   Social History Main Topics  . Smoking status: Former Smoker    Types: Cigarettes    Quit date: 11/07/2008  . Smokeless tobacco: Never Used  . Alcohol use Yes     Comment: occasional  . Drug use: No  . Sexual activity: No   Other Topics Concern  . Not on file   Social History Narrative   Married   Enjoys walking   Has lived in [place] > 16 years    FAMILY HISTORY: No family history of liver disease or malignancy.  ALLERGIES:  has No Known Allergies.  MEDICATIONS:  Current Outpatient Prescriptions on File Prior to Visit  Medication Sig Dispense Refill  . clindamycin (CLINDAGEL) 1 % gel Apply topically 2 (two) times daily. 30 g 2  . diphenoxylate-atropine (LOMOTIL) 2.5-0.025 MG tablet Take 1-2 tablets by mouth 4 (four) times daily as needed for diarrhea or loose stools. 30 tablet 0  . folic acid (FOLVITE) 1 MG tablet Take 1 tablet (1 mg total) by mouth daily. 30 tablet 3  . hydrocortisone 1 % ointment Apply 1 application topically 2 (two) times  daily. 56 g 0  . lidocaine-prilocaine (EMLA) cream Apply 1 application topically as needed. Apply to [facility] cath at least one hour before needle stick. 30 g 2  . lisinopril-hydrochlorothiazide (PRINZIDE,ZESTORETIC) 10-12.5 MG tablet     . loperamide (IMODIUM) 2 MG capsule Take 2 capsules (4 mg total) by mouth 4 (four) times daily as needed for diarrhea or loose stools (NO MORE THAN 8 TABLETS PER DAY.). 60 capsule 3  . magic mouthwash SOLN Take 5 mLs by mouth 4 (four) times daily. 120 mL 1  . magnesium oxide (MAG-OX) 400 (241.3 Mg) MG tablet Take 1 tablet (400 mg total) by mouth 3 (three) times daily. 90 tablet 3  . metFORMIN (GLUCOPHAGE) 500 MG tablet Take 1 tablet (500 mg total) by mouth 2 (two) times daily with a meal. 60 tablet 0  . nadolol (CORGARD) 20 MG tablet Take 1 tablet (20 mg total) by mouth 2 (two) times daily. THIS IS A ONE TIME ORDER.  FUTURE REFILLS NEED TO BE DONE BY PRIMARY MD. 60 tablet 1  . ondansetron (ZOFRAN) 8 MG tablet Take 1 tablet (8 mg total) by mouth every 8 (eight) hours as needed for nausea or vomiting. 45 tablet 2  . pantoprazole (PROTONIX) 40 MG tablet Take 1 tablet (40 mg total) by mouth daily. 30 tablet 2   Current Facility-Administered Medications on File Prior to Visit  Medication Dose Route Frequency Provider Last Rate Last Dose  . 0.9 %  sodium chloride infusion   Intravenous Once Truitt Merle, MD      . 0.9 %  sodium chloride infusion   Intravenous Once Truitt Merle, MD      . ferumoxytol [facility]) 510 mg in sodium chloride 0.9 % 100 mL IVPB  510 mg Intravenous Once Truitt Merle, MD      . fluorouracil (ADRUCIL) 3,050 mg in sodium chloride 0.9 % 89 mL chemo infusion  1,800 mg/m2 (Treatment Plan Recorded) Intravenous Q14 Days Truitt Merle, MD      . irinotecan (CAMPTOSAR) 240 mg in sodium chloride 0.9 % 500 mL chemo infusion  140 mg/m2 (Treatment Plan Recorded) Intravenous Once Truitt Merle, MD 341 mL/hr at 10/07/16 1542 240 mg at 10/07/16 1542  . sodium chloride 0.9 % injection 10  mL  10 mL Intracatheter PRN Truitt Merle, MD   10 mL at 10/03/15 1705  . sodium chloride 0.9 % injection 10 mL  10 mL Intravenous PRN Truitt Merle, MD   10 mL at 05/14/16 1127  ;   REVIEW OF SYSTEMS:   Constitutional: Denies fevers, chills or abnormal night sweats, (+) fatigue  Eyes: Denies blurriness of vision, double vision or watery eyes Ears, nose, mouth, throat, and face: Denies mucositis or sore throat Respiratory: Denies cough, dyspnea or wheezes Cardiovascular: Denies palpitation, chest discomfort or lower extremity swelling Gastrointestinal: Denies nausea, heartburn or change in bowel habits Skin: Denies abnormal skin rashes Lymphatics: Denies new lymphadenopathy or easy bruising Neurological:Denies numbness, tingling or new weaknesses Behavioral/Psych: Mood is stable, no new changes, (+) insomnia All other systems were reviewed with the patient and are negative.  PHYSICAL EXAMINATION: BP (!) 126/48 (BP Location: Left Arm, Patient Position: Sitting) Comment: Let the nurse Mtrytle aware of the BP.  Pulse 62   Temp 98.2 F (36.8 C) (Oral)   Resp 17   Ht _0  (1.651 m)   Wt 143 lb 14.4 oz (65.3 kg)   SpO2 99%   BMI 23.95 kg/m   ECOG PERFORMANCE STATUS: 1 GENERAL:alert, no distress and comfortable SKIN: (+) Dry skin,  no skin ulcer, (+) scatter skin rashes on his neck and upper chest, some are resolving with skin pigmentation. No discharge. EYES: normal, conjunctiva are pink and non-injected, sclera clear OROPHARYNX:no exudate, no erythema and lips, buccal mucosa, and tongue with mild discoloration at the tip. The low front teeth are missing  NECK: supple, thyroid normal size, non-tender, without nodularity LYMPH:  no palpable lymphadenopathy in the cervical, axillary or inguinal LUNGS: clear to auscultation and percussion with normal breathing effort, no rales   HEART: regular rate & rhythm and no murmurs and no lower extremity edema ABDOMEN:abdomen soft, non-tender, no  hepatomegaly, no splenomegaly and normal bowel sounds Musculoskeletal:no cyanosis of digits and no clubbing  PSYCH: alert & oriented x 3 with fluent speech NEURO: no focal motor/sensory deficits   LABORATORY DATA:  I have reviewed the data as listed CBC Latest Ref Rng & Units 10/07/2016 09/23/2016 09/02/2016  WBC 4.0 - 10.3 10e3/uL 5.8 4.7 3.8(L)  Hemoglobin 13.0 - 17.1 g/dL 9.5(L) 9.5(L) 9.0(L)  Hematocrit 38.4 - 49.9 % 30.2(L) 29.3(L) 27.8(L)  Platelets 140 - 400 10e3/uL 211 163 133(L)    CMP Latest Ref Rng & Units 10/07/2016 09/23/2016 09/02/2016  Glucose 70 - 140 mg/dl 106 152(H) 269(H)  BUN 7.0 - 26.0 mg/dL 11.3 10.2 12.5  Creatinine 0.7 - 1.3 mg/dL 0.8 0.8 0.8  Sodium 136 - 145 mEq/L 141 139 140  Potassium 3.5 - 5.1 mEq/L 4.4 4.4 3.9  Chloride 101 - 111 mmol/L - - -  CO2 22 - 29 mEq/L _1 Calcium 8.4 - 10.4 mg/dL 8.7 9.1 8.1(L)  Total Protein 6.4 - 8.3 g/dL 7.7 7.6 6.8  Total Bilirubin 0.20 - 1.20 mg/dL 0.59 0.51 0.47  Alkaline Phos 40 - 150 U/L 220(H) 220(H) 188(H)  AST 5 - 34 U/L _2 ALT 0 - 55 U/L _3 INITIAL tumor markers AFP 3.2 CEA > 10,000 CA 19-9 12,929.6  Results for WORTHY, BOSCHERT (MRN [id number]) as of 09/23/2016 10:13  Ref. Range 07/08/2016 10:05 08/05/2016 09:12 09/02/2016 08:57  CEA Latest Ref Range: 0.0 - 4.7 ng/mL 716.6 (H)    CEA ([facility]) Latest Ref Range: 0.00 - 5.00 ng/mL 834.19 (  H) 1,102.80 (H) 1,286.28 (H)    Pathology report  Liver, needle/core biopsy - METASTATIC ADENOCARCINOMA, SEE COMMENT. Microscopic Comment The adenocarcinoma demonstrates the following immunophenotype: Cytokeratin 7 - negative expression. Cytokeratin 20 - strong diffuse expression. CD2 - strong diffuse expression. Overall the morphology and immunophenotype are that of metastatic adenocarcinoma primary to colorectum. The recent nuclear medicine scan demonstrating sigmoid mass with associated liver masses is noted.  [facility] test result:     RADIOGRAPHIC STUDIES: I have personally reviewed the outside CT scan image with patient and his son.   CT chest, abdomen and pelvis with IV contrast on  08/19/2016  IMPRESSION: 1. Respiratory motion degrades image quality in the lungs. Favor slight enlargement of a right lower lobe pulmonary nodule as well as development of a new pulmonary nodule in the left upper lobe. 2. Hepatic metastatic disease is grossly stable. Associated marginal irregularity of the liver is likely due to hepatic fibrosis from treated metastatic disease, with evidence of portal hypertension. 3. Right adrenal nodule is stable to minimally enlarged. 4. Aortic atherosclerosis (ICD10-170.0). Coronary artery calcification.   ASSESSMENT & PLAN:  70 year old [place] male, with past history of hypertension and dilated nonischemic gammopathy with EF 25%, no clinical signs of heart failure, who was found to have hepatitis B infection lately, and multiple liver lesions on the CT scan. He has extremely high CEA and CA 19-9 levels. PET scan reviewed a hypermetabolic sigmoid colon mass, diffuse liver metastasis, probable lung and adrenal gland metastasis.  1. Metastatic sigmoid colon cancer, with diffuse liver, lungs, node and left adrenal gland metastases. KRAS/NRAS wild type, MSI-stable -Liver biopsy showed metastatic adenocarcinoma. His tumor were strongly positive for CK20 and CD2, consistent with primary colorectal primary. KRAS and NRAS mutations were not detected.  -Pt understands that this is incurable cancer, and he has very high disease burden and overall prognosis is poor. The treatment goal is palliative -I previously discussed his restaging CT from 08/19/2016 which showed overall stable disease in liver, a few small lung nodules, slightly bigger, will continue monitoring. -I think his scan showed overall some improvement since we restarted on 5-FU in August 2017. -His tumor marker CEA has been trending up,  concerning for slow disease progression, we'll continue monitoring -He is clinically doing well, tolerating concurrent chemotherapy FOLFIRI and panitumumab very well, we'll continue for now. He has healed from teeth extractio last week.n  -Lab reviewed, adequate for treatment, we'll proceed to chemotherapy today -plan to repeat a staging CT scan in early January.    2. Grade 1-2 skin rashes, stable  -Secondary to panitumumab, stable overall  -continue hydrocortisone 2.5%, and clindamycin gel 1%  twice daily as needed  -He knows to avoid sun exposure, and call me if it gets worse.  3. Gastric ulcer with significant GI bleeding in April and Aug 2016 -He is on PPI, continue once daily  -Repeat his EGD on 08/15/2015 showed near complete healing of his gastric ulcer -continue Nadolol 38m bid, per Dr. SFuller Plan- I encourage him to follow-up with Dr. SFuller [facility] since he has restarted 5-fu   4. Type 2 DM  -His glucose level has increased lately, I encouraged him to watch his diet, avoid any sweets, and monitor closely at home -I encourage him to follow-up with his primary care physician Dr. WJimmye [place]  5. HTN, Dilated nonischemic ischemia cardiomyopathy with EF 25% -He is clinically doing well without symptoms of CHF. However this is probably going to impact his chemotherapy.Will  try to avoid cardiotoxic chemotherapy agent and avoid fluid overload during chemotherapy. -Continue follow-up with cardiology. -will hold on his lisinopril-HCTZ for now, his BP has been normal lately   6 Hepatitis B carrier, with mild portal hypertension  -Per liver clinic, no need for treatment. Follow-up with Dr. Silvio Pate   7. Malnutrition -I encouraged him to eat more, and take supplements as needed. -improved, gained weight back after I reduce his chemotherapy dose  -follow up with Dietitian   8. Anemia secondary to GI bleeding, iron deficiency and chemo  -Repeat lab on 06/06/2015 showed  ferritin 117, serum iron 24, saturation 8%, which supports iron deficiency -He received IV Feraheme again in Aug 2016 after GI bleeding  -Repeat a ferritin was 273 on 10/03/2015, much improved  -he received iv feraheme again on 11/27/2015 and 12/04/2015, but anemia did not improve much. -His anemia has been slightly worse lately, probably related to chemotherapy, we'll close monitor any signs of GI bleeding. -Blood transfusion if hemoglobin less than 8 or symptomatic anemia with hemoglobin 8-9  9. hypomagnesemia  -secondary to panitumumab -he receives IV mag 6 g every week on Wednesdays -He is taking magnesium pill 2 tablets 3 times a day -follow up weekly    Plan -Lab reviewed, adequate for treatment, we'll proceed FOLFIRI and panitumumab today and continue every 2 weeks.  -magnesium infusion weekly on Wednesday  -I'll see him back in 2 weeks. Will order restaging CT scan on next visit  All questions were answered. The patient knows to call the clinic with any problems, questions or concerns.  I spent 20 minutes counseling the patient face to face. The total time spent in the appointment was 25 minutes and more than 50% was on counseling.     Truitt Merle, MD 10/07/16

## 2016-10-07 NOTE — Telephone Encounter (Signed)
Appointments scheduled per 10/07/16 los. A copy of the appointment schedule was given to the patient, per 10/07/16 los.  °

## 2016-10-07 NOTE — Patient Instructions (Addendum)
[facility] Discharge Instructions for Patients Receiving Chemotherapy  Today you received the following chemotherapy agents Camptosar/Vectibix/Adrucil  To help prevent nausea and vomiting after your treatment, we encourage you to take your nausea medication    If you develop nausea and vomiting that is not controlled by your nausea medication, call the clinic.   BELOW ARE SYMPTOMS THAT SHOULD BE REPORTED IMMEDIATELY:  *FEVER GREATER THAN 100.5 F  *CHILLS WITH OR WITHOUT FEVER  NAUSEA AND VOMITING THAT IS NOT CONTROLLED WITH YOUR NAUSEA MEDICATION  *UNUSUAL SHORTNESS OF BREATH  *UNUSUAL BRUISING OR BLEEDING  TENDERNESS IN MOUTH AND THROAT WITH OR WITHOUT PRESENCE OF ULCERS  *URINARY PROBLEMS  *BOWEL PROBLEMS  UNUSUAL RASH Items with * indicate a potential emergency and should be followed up as soon as possible.  Feel free to call the clinic you have any questions or concerns. The clinic phone number is [phone].  Please show the [facility] at check-in to the Emergency Department and triage nurse.

## 2016-10-09 ENCOUNTER — Ambulatory Visit (HOSPITAL_BASED_OUTPATIENT_CLINIC_OR_DEPARTMENT_OTHER): Payer: Medicare Other

## 2016-10-09 VITALS — BP 143/63 | HR 55 | Temp 97.7°F | Resp 18

## 2016-10-09 DIAGNOSIS — D509 Iron deficiency anemia, unspecified: Secondary | ICD-10-CM

## 2016-10-09 DIAGNOSIS — C187 Malignant neoplasm of sigmoid colon: Secondary | ICD-10-CM

## 2016-10-09 MED ORDER — SODIUM CHLORIDE 0.9 % IJ SOLN
10.0000 mL | INTRAMUSCULAR | Status: DC | PRN
  Administered 2016-10-09: 10 mL
  Filled 2016-10-09: qty 10

## 2016-10-09 MED ORDER — MAGNESIUM SULFATE 50 % IJ SOLN
Freq: Once | INTRAMUSCULAR | Status: AC
  Administered 2016-10-09: 15:00:00 via INTRAVENOUS
  Filled 2016-10-09: qty 250

## 2016-10-09 MED ORDER — HEPARIN SOD (PORK) LOCK FLUSH 100 UNIT/ML IV SOLN
500.0000 [IU] | Freq: Once | INTRAVENOUS | Status: AC | PRN
  Administered 2016-10-09: 500 [IU]
  Filled 2016-10-09: qty 5

## 2016-10-09 NOTE — Patient Instructions (Signed)
Hypomagnesemia °Hypomagnesemia is a condition in which the level of magnesium in the blood is low. Magnesium is a mineral that is found in many foods. It is used in many different processes in the body. Hypomagnesemia can affect every organ in the body. It can cause life-threatening problems. °CAUSES °Causes of hypomagnesemia include: °· Not getting enough magnesium in your diet. °· Malnutrition. °· Problems with absorbing magnesium from the intestines. °· Dehydration. °· Alcohol abuse. °· Vomiting. °· Severe diarrhea. °· Some medicines, including medicines that make you urinate more. °· Certain diseases, such as kidney disease, diabetes, and overactive thyroid. °SIGNS AND SYMPTOMS °· Involuntary shaking or trembling of a body part (tremor). °· Confusion. °· Muscle weakness. °· Sensitivity to light, sound, and touch. °· Psychiatric issues, such as depression, irritability, or psychosis. °· Sudden tightening of muscles (muscle spasms). °· Tingling in the arms and legs. °· A feeling of fluttering of the heart. °These symptoms are more severe if magnesium levels drop suddenly. °DIAGNOSIS °To make a diagnosis, your health care provider will do a physical exam and order blood and urine tests. °TREATMENT °Treatment will depend on the cause and the severity of your condition. It may involve: °· A magnesium supplement. This can be taken in pill form. It can also be given through an IV tube. This is usually done if the condition is severe. °· Changes to your diet. You may be directed to eat foods that have a lot of magnesium, such as green leafy vegetables, peas, beans, and nuts. °· Eliminating alcohol from your diet. °HOME CARE INSTRUCTIONS °· Include foods with magnesium in your diet. Foods that are rich in magnesium include green vegetables, beans, nuts and seeds, and whole grains. °· Take medicines only as directed by your health care provider. °· Take magnesium supplements if your health care provider instructs you to  do that. Take them as directed. °· Have your magnesium levels monitored as directed by your health care provider. °· When you are active, drink fluids that contain electrolytes. °· Keep all follow-up visits as directed by your health care provider. This is important. °SEEK MEDICAL CARE IF: °· You get worse instead of better. °· Your symptoms return. °SEEK IMMEDIATE MEDICAL CARE IF: °· Your symptoms are severe. °  °This information is not intended to replace advice given to you by your health care provider. Make sure you discuss any questions you have with your health care provider. °  °Document Released: 07/10/2005 Document Revised: 11/04/2014 Document Reviewed: 05/30/2014 °Elsevier Interactive Patient Education ©2016 [employer] ° °

## 2016-10-15 ENCOUNTER — Other Ambulatory Visit: Payer: Self-pay | Admitting: *Deleted

## 2016-10-16 ENCOUNTER — Ambulatory Visit (HOSPITAL_BASED_OUTPATIENT_CLINIC_OR_DEPARTMENT_OTHER): Payer: Medicare Other

## 2016-10-16 VITALS — BP 116/82 | HR 69 | Temp 98.6°F | Resp 16

## 2016-10-16 DIAGNOSIS — Z95828 Presence of other vascular implants and grafts: Secondary | ICD-10-CM

## 2016-10-16 DIAGNOSIS — D509 Iron deficiency anemia, unspecified: Secondary | ICD-10-CM

## 2016-10-16 MED ORDER — SODIUM CHLORIDE 0.9 % IV SOLN
6.0000 g | Freq: Once | INTRAVENOUS | Status: DC
  Filled 2016-10-16: qty 12

## 2016-10-16 MED ORDER — SODIUM CHLORIDE 0.9 % IJ SOLN
10.0000 mL | INTRAMUSCULAR | Status: DC | PRN
  Administered 2016-10-16: 10 mL via INTRAVENOUS
  Filled 2016-10-16: qty 10

## 2016-10-16 MED ORDER — HEPARIN SOD (PORK) LOCK FLUSH 100 UNIT/ML IV SOLN
500.0000 [IU] | Freq: Once | INTRAVENOUS | Status: AC | PRN
  Administered 2016-10-16: 500 [IU] via INTRAVENOUS
  Filled 2016-10-16: qty 5

## 2016-10-16 MED ORDER — SODIUM CHLORIDE 0.9 % IV SOLN
6.0000 g | Freq: Once | INTRAVENOUS | Status: AC
  Administered 2016-10-16: 6 g via INTRAVENOUS
  Filled 2016-10-16: qty 250

## 2016-10-16 NOTE — Patient Instructions (Signed)
Hypomagnesemia Introduction Hypomagnesemia is a condition in which the level of magnesium in the blood is low. Magnesium is a mineral that is found in many foods. It is used in many different processes in the body. Hypomagnesemia can affect every organ in the body. It can cause life-threatening problems. What are the causes? Causes of hypomagnesemia include:  Not getting enough magnesium in your diet.  Malnutrition.  Problems with absorbing magnesium from the intestines.  Dehydration.  Alcohol abuse.  Vomiting.  Severe diarrhea.  Some medicines, including medicines that make you urinate more.  Certain diseases, such as kidney disease, diabetes, and overactive thyroid. What are the signs or symptoms?  Involuntary shaking or trembling of a body part (tremor).  Confusion.  Muscle weakness.  Sensitivity to light, sound, and touch.  Psychiatric issues, such as depression, irritability, or psychosis.  Sudden tightening of muscles (muscle spasms).  Tingling in the arms and legs.  A feeling of fluttering of the heart. These symptoms are more severe if magnesium levels drop suddenly. How is this diagnosed? To make a diagnosis, your health care provider will do a physical exam and order blood and urine tests. How is this treated? Treatment will depend on the cause and the severity of your condition. It may involve:  A magnesium supplement. This can be taken in pill form. It can also be given through an IV tube. This is usually done if the condition is severe.  Changes to your diet. You may be directed to eat foods that have a lot of magnesium, such as green leafy vegetables, peas, beans, and nuts.  Eliminating alcohol from your diet. Follow these instructions at home:  Include foods with magnesium in your diet. Foods that are rich in magnesium include green vegetables, beans, nuts and seeds, and whole grains.  Take medicines only as directed by your health care  provider.  Take magnesium supplements if your health care provider instructs you to do that. Take them as directed.  Have your magnesium levels monitored as directed by your health care provider.  When you are active, drink fluids that contain electrolytes.  Keep all follow-up visits as directed by your health care provider. This is important. Contact a health care provider if:  You get worse instead of better.  Your symptoms return. Get help right away if:  Your symptoms are severe. This information is not intended to replace advice given to you by your health care provider. Make sure you discuss any questions you have with your health care provider. Document Released: 07/10/2005 Document Revised: 03/21/2016 Document Reviewed: 05/30/2014  2017 Elsevier

## 2016-10-22 ENCOUNTER — Ambulatory Visit (HOSPITAL_BASED_OUTPATIENT_CLINIC_OR_DEPARTMENT_OTHER): Payer: Medicare Other

## 2016-10-22 ENCOUNTER — Telehealth: Payer: Self-pay | Admitting: *Deleted

## 2016-10-22 ENCOUNTER — Telehealth: Payer: Self-pay | Admitting: Hematology

## 2016-10-22 ENCOUNTER — Ambulatory Visit (HOSPITAL_BASED_OUTPATIENT_CLINIC_OR_DEPARTMENT_OTHER): Payer: Medicare Other | Admitting: Hematology

## 2016-10-22 ENCOUNTER — Encounter: Payer: Self-pay | Admitting: Hematology

## 2016-10-22 ENCOUNTER — Other Ambulatory Visit (HOSPITAL_BASED_OUTPATIENT_CLINIC_OR_DEPARTMENT_OTHER): Payer: Medicare Other

## 2016-10-22 VITALS — BP 141/62 | HR 74 | Temp 98.4°F | Resp 18 | Ht 65.0 in | Wt 143.1 lb

## 2016-10-22 DIAGNOSIS — Z5111 Encounter for antineoplastic chemotherapy: Secondary | ICD-10-CM | POA: Diagnosis not present

## 2016-10-22 DIAGNOSIS — C187 Malignant neoplasm of sigmoid colon: Secondary | ICD-10-CM

## 2016-10-22 DIAGNOSIS — I10 Essential (primary) hypertension: Secondary | ICD-10-CM

## 2016-10-22 DIAGNOSIS — C7972 Secondary malignant neoplasm of left adrenal gland: Secondary | ICD-10-CM

## 2016-10-22 DIAGNOSIS — I5042 Chronic combined systolic (congestive) and diastolic (congestive) heart failure: Secondary | ICD-10-CM

## 2016-10-22 DIAGNOSIS — L270 Generalized skin eruption due to drugs and medicaments taken internally: Secondary | ICD-10-CM

## 2016-10-22 DIAGNOSIS — C772 Secondary and unspecified malignant neoplasm of intra-abdominal lymph nodes: Secondary | ICD-10-CM | POA: Diagnosis not present

## 2016-10-22 DIAGNOSIS — C787 Secondary malignant neoplasm of liver and intrahepatic bile duct: Secondary | ICD-10-CM

## 2016-10-22 DIAGNOSIS — E119 Type 2 diabetes mellitus without complications: Secondary | ICD-10-CM

## 2016-10-22 DIAGNOSIS — Z5112 Encounter for antineoplastic immunotherapy: Secondary | ICD-10-CM | POA: Diagnosis not present

## 2016-10-22 DIAGNOSIS — D509 Iron deficiency anemia, unspecified: Secondary | ICD-10-CM

## 2016-10-22 DIAGNOSIS — E46 Unspecified protein-calorie malnutrition: Secondary | ICD-10-CM

## 2016-10-22 DIAGNOSIS — C7800 Secondary malignant neoplasm of unspecified lung: Secondary | ICD-10-CM

## 2016-10-22 DIAGNOSIS — K922 Gastrointestinal hemorrhage, unspecified: Secondary | ICD-10-CM

## 2016-10-22 DIAGNOSIS — K254 Chronic or unspecified gastric ulcer with hemorrhage: Secondary | ICD-10-CM

## 2016-10-22 DIAGNOSIS — I255 Ischemic cardiomyopathy: Secondary | ICD-10-CM

## 2016-10-22 DIAGNOSIS — D500 Iron deficiency anemia secondary to blood loss (chronic): Secondary | ICD-10-CM

## 2016-10-22 DIAGNOSIS — D6481 Anemia due to antineoplastic chemotherapy: Secondary | ICD-10-CM

## 2016-10-22 LAB — COMPREHENSIVE METABOLIC PANEL
ALT: 23 U/L (ref 0–55)
AST: 30 U/L (ref 5–34)
Albumin: 3.3 g/dL — ABNORMAL LOW (ref 3.5–5.0)
Alkaline Phosphatase: 222 U/L — ABNORMAL HIGH (ref 40–150)
Anion Gap: 8 mEq/L (ref 3–11)
BUN: 13.4 mg/dL (ref 7.0–26.0)
CHLORIDE: 107 meq/L (ref 98–109)
CO2: 25 meq/L (ref 22–29)
CREATININE: 0.8 mg/dL (ref 0.7–1.3)
Calcium: 8.5 mg/dL (ref 8.4–10.4)
EGFR: >90 mL/min/{1.73_m2} (ref >90)
Glucose: 173 mg/dl — ABNORMAL HIGH (ref 70–140)
Potassium: 4.8 mEq/L (ref 3.5–5.1)
Sodium: 141 mEq/L (ref 136–145)
TOTAL PROTEIN: 7.7 g/dL (ref 6.4–8.3)
Total Bilirubin: 0.46 mg/dL (ref 0.20–1.20)

## 2016-10-22 LAB — MAGNESIUM

## 2016-10-22 LAB — CBC WITH DIFFERENTIAL/PLATELET
BASO%: 0.6 % (ref 0.0–2.0)
Basophils Absolute: 0 10*3/uL (ref 0.0–0.1)
EOS%: 2.9 % (ref 0.0–7.0)
Eosinophils Absolute: 0.1 10*3/uL (ref 0.0–0.5)
HCT: 29 % — ABNORMAL LOW (ref 38.4–49.9)
HGB: 9.2 g/dL — ABNORMAL LOW (ref 13.0–17.1)
LYMPH#: 0.6 10*3/uL — AB (ref 0.9–3.3)
LYMPH%: 13.3 % — AB (ref 14.0–49.0)
MCH: 26.9 pg — ABNORMAL LOW (ref 27.2–33.4)
MCHC: 31.7 g/dL — AB (ref 32.0–36.0)
MCV: 84.8 fL (ref 79.3–98.0)
MONO#: 0.7 10*3/uL (ref 0.1–0.9)
MONO%: 13.9 % (ref 0.0–14.0)
NEUT%: 69.3 % (ref 39.0–75.0)
NEUTROS ABS: 3.3 10*3/uL (ref 1.5–6.5)
Platelets: 193 10*3/uL (ref 140–400)
RBC: 3.42 10*6/uL — AB (ref 4.20–5.82)
RDW: 18.1 % — ABNORMAL HIGH (ref 11.0–14.6)
WBC: 4.8 10*3/uL (ref 4.0–10.3)

## 2016-10-22 MED ORDER — SODIUM CHLORIDE 0.9 % IV SOLN
140.0000 mg/m2 | Freq: Once | INTRAVENOUS | Status: AC
  Administered 2016-10-22: 240 mg via INTRAVENOUS
  Filled 2016-10-22: qty 10

## 2016-10-22 MED ORDER — ONDANSETRON HCL 8 MG PO TABS
8.0000 mg | ORAL_TABLET | Freq: Once | ORAL | Status: AC
  Administered 2016-10-22: 8 mg via ORAL

## 2016-10-22 MED ORDER — SODIUM CHLORIDE 0.9 % IV SOLN: Freq: Once | INTRAVENOUS | Status: DC

## 2016-10-22 MED ORDER — SODIUM CHLORIDE 0.9 % IV SOLN
6.2000 mg/kg | Freq: Once | INTRAVENOUS | Status: AC
  Administered 2016-10-22: 400 mg via INTRAVENOUS
  Filled 2016-10-22: qty 20

## 2016-10-22 MED ORDER — SODIUM CHLORIDE 0.9 % IV SOLN
Freq: Once | INTRAVENOUS | Status: AC
  Administered 2016-10-22: 14:00:00 via INTRAVENOUS

## 2016-10-22 MED ORDER — ATROPINE SULFATE 1 MG/ML IJ SOLN
INTRAMUSCULAR | Status: AC
  Filled 2016-10-22: qty 1

## 2016-10-22 MED ORDER — SODIUM CHLORIDE 0.9 % IV SOLN
1800.0000 mg/m2 | INTRAVENOUS | Status: DC
  Administered 2016-10-22: 3050 mg via INTRAVENOUS
  Filled 2016-10-22: qty 61

## 2016-10-22 MED ORDER — ONDANSETRON HCL 8 MG PO TABS
ORAL_TABLET | ORAL | Status: AC
  Filled 2016-10-22: qty 1

## 2016-10-22 MED ORDER — ATROPINE SULFATE 1 MG/ML IJ SOLN
0.5000 mg | Freq: Once | INTRAMUSCULAR | Status: AC | PRN
  Administered 2016-10-22: 0.5 mg via INTRAVENOUS

## 2016-10-22 MED ORDER — DEXAMETHASONE SODIUM PHOSPHATE 10 MG/ML IJ SOLN
INTRAMUSCULAR | Status: AC
  Filled 2016-10-22: qty 1

## 2016-10-22 MED ORDER — DEXAMETHASONE SODIUM PHOSPHATE 10 MG/ML IJ SOLN
10.0000 mg | Freq: Once | INTRAMUSCULAR | Status: AC
  Administered 2016-10-22: 10 mg via INTRAVENOUS

## 2016-10-22 NOTE — Telephone Encounter (Signed)
Per LOS I have scheduled appts and notified the scheduler 

## 2016-10-22 NOTE — Progress Notes (Signed)
[place]  [phone] [phone]) [phone]  Clinic follow up Note   Patient Care Team: [name], MD as PCP - General (Specialist) [name], CRNP as Nurse Practitioner (Nurse Practitioner) Truitt Merle, MD as Consulting Physician (Hematology) Ladene Artist, MD as Consulting Physician (Gastroenterology) 10/22/2016  CHIEF COMPLAINTS Follow up metastatic sigmoid colon cancer  Oncology History   He is a Metastatic colon cancer to liver   Staging form: Colon and Rectum, AJCC 7th Edition     Clinical: Stage Unknown ([place], NX, M1) - Unsigned      Cancer of sigmoid colon ([place])   10/28/2014 Tumor Marker    AFP 3.2 CEA > 10,000 CA 19-9 12,929.6. tumor (-) KRAS and NRAS mutation.       11/10/2014 Imaging    PET scan showed hypermetabolic mass in the sigmoid colon was noted metastasis in the retroperitoneum. Probable left adrenal and pulmonary metastasis, and diffuse liver metastasis.      11/21/2014 Initial Diagnosis    Metastatic colon cancer to liver, lung, abd nodes and left adrenal gland. Diagnosis was made by liver biopsy.       11/30/2014 - 05/23/2015 Chemotherapy    First line chemo [id number], Panitumumab added from second cycle, chemo held due to recurrent GI bleeding        12/28/2014 - 12/31/2014 Hospital Admission    Was admitted for dehydration, neutropenia fever with UTI, and severe skin rash.      02/08/2015 - 02/12/2015 Hospital Admission    He was admitted for upper GI bleeding, e.g. showed a gastric ulcer with clots, status post appendectomy injection. He also received a blood transfusion.      03/01/2015 Tumor Marker    CEA 694, CA19.9 462      03/13/2015 Imaging    CT CAP showed partial response, no new lesions.       05/31/2015 - 06/02/2015 Hospital Admission    He was admitted to [facility] in [place] due to upper GI bleeding, EGD showed gastric and dudenal ulcers       06/27/2015 - 06/29/2015 Hospital Admission    he was  admitted for dairrhea and pancolitis, c-diff and stool cultures were negative, treated with antibiotics      07/12/2015 -  Chemotherapy    panitumumab 30m/kg, every 2 weeks      09/19/2015 -  Chemotherapy    Irinotecan 1868mm2 every 2 weeks, dose reduction due to diarrhea. 5-Fu added on 06/10/2016 due to slight disease progression       11/08/2015 - 11/10/2015 Hospital Admission    Pt was admitted for sepsis and hypotension, was found to have (+) influenza A and treated.       11/30/2015 Imaging     CT scans reviewed continued improvement in the liver metastasis, no other new lesions.      05/06/2016 Tumor Marker    CEA has gradually increased from 110 in March 2017 to 566 in July 2070      06/04/2016 Imaging    Restaging CT scan showed similar to mild increase in hepatic metastasis, mild enlargement of right adrenal nodule, primary similar pulmonary nodules. A left upper lobe nodule has resolved.        HISTORY OF PRESENTING ILLNESS:  Pa[place]urg[place]dup [age].o. male is here because of abnormal CT findings, which is very suspicious for malignancy. [place] He came in with his son  and an interpreter.  He has been feeling fatigued since two month ago. He is still able to do all ADLs. He otherwise denies any pain, bloating or nausea.  He lost about 20lbs in 3 month. His appetite is lower than before, eats less, no change of his bowl habits.  She denied any hematochezia or melana. Per his son, he has had some personality changes daily, irritable, slightly confused some time.  He was evaluated by his primary care physician. Lab test reviewed hepatitis B infection, which he did not know before, and elevated alkaline phosphatase, his liver function and the rest of the liver function was not remarkable. [place] of abdomen was obtained on 07/22/2014, which showed diffusely abnormal liver with multiple echogenic lesions. CT of abdomen with and without  contrast was done on 08/26/2014, which reviewed here at a medically with multiple large partially calcified hepatic masses consistent with metastatic disease. Mild retroperitoneal adenopathy with the largest node measuring 1.6 cm. And nonspecific 1.4 cm left adrenal nodule was also noticed. His tumor marker showed CEA greater than 10,000, CA 19-9 12,929, AFP 3.2 (normal). He was referred to [facility] and was evaluated by nurse practitioner [name]. Treatment for hepatitis B was not recommended based on his virus load.  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 He was evaluated by a cardiologist in 2014. He denies any significant dyspnea on exertion. No leg swollen.  CURRENT THERAPY:  1. panitumumab 73m/kg, every 2 weeks, started on 07/12/2015 2. Irinotecan 1887mm2 every 2 weeks added on 09/19/2015, dose decreased to 16044m2 from cycle 2, and further decreased to 125m73m from cycle 10 due to diarrhea and anorexia, changed to 140mg6mfrom cycle 13. 3. 5-FU 1800mg/70mver 46 hours added back from 06/10/2016 due to mild disease progression 4. He also receives IV magnesium 6g weekly   INTERIM HISTORY: [place] rBrittainns for follow-up and chemotherapy. He is accompanied by an interpreter to the clinic today. He is doing well overall, denies any significant pain, fever, chills, or other new symptoms. His appetite and energy level remains to be well overall, he is able to function well at home.  MEDICAL HISTORY:  Past Medical History:  Diagnosis Date  . Abnormal EKG   . Diabetes mellitus without complication (HCC)    metformin  . Gastric ulcer   . H. pylori infection   . Hepatitis B   . Hypertension   . Metastatic colon cancer to liver (HCC)  AlianzaNon-ischemic cardiomyopathy (HCC)  [place]     SURGICAL HISTORY: Past Surgical History:  Procedure Laterality Date  . ESOPHAGOGASTRODUODENOSCOPY N/A 02/08/2015    Procedure: ESOPHAGOGASTRODUODENOSCOPY (EGD);  Surgeon: MalcolLadene Artist [facility];  Service: Endoscopy;  Laterality: N/A;  . ESOPHAGOGASTRODUODENOSCOPY (EGD) WITH PROPOFOL N/A 08/15/2015   Procedure: ESOPHAGOGASTRODUODENOSCOPY (EGD) WITH PROPOFOL;  Surgeon: MalcolLadene Artist Location: [facility];  Service: Endoscopy;  Laterality: N/A;  . LEFT AND RIGHT HEART CATHETERIZATION WITH CORONARY ANGIOGRAM N/A 03/29/2013   Procedure: LEFT AND RIGHT HEART CATHETERIZATION WITH CORONARY ANGIOGRAM;  Surgeon: [place]ie Casino [facility];  Service: Cardiovascular;  Laterality: N/A;  . Nuclear Stress Test  03/03/2013   High risk - consistent with nonischemic cardiomyopathy    SOCIAL HISTORY: Social History   Social History  . Marital status: Married    Spouse name: N/A  . Number of children: N/A  . Years of education: N/A  Occupational History  . Not on file.   Social History Main Topics  . Smoking status: Former Smoker    Types: Cigarettes    Quit date: 11/07/2008  . Smokeless tobacco: Never Used  . Alcohol use Yes     Comment: occasional  . Drug use: No  . Sexual activity: No   Other Topics Concern  . Not on file   Social History Narrative   Married   Enjoys walking   Has lived in [place] > 16 years    FAMILY HISTORY: No family history of liver disease or malignancy.  ALLERGIES:  has No Known Allergies.  MEDICATIONS:  Current Outpatient Prescriptions on File Prior to Visit  Medication Sig Dispense Refill  . clindamycin (CLINDAGEL) 1 % gel Apply topically 2 (two) times daily. 30 g 2  . diphenoxylate-atropine (LOMOTIL) 2.5-0.025 MG tablet Take 1-2 tablets by mouth 4 (four) times daily as needed for diarrhea or loose stools. 30 tablet 0  . folic acid (FOLVITE) 1 MG tablet Take 1 tablet (1 mg total) by mouth daily. 30 tablet 3  . hydrocortisone 1 % ointment Apply 1 application topically 2 (two) times daily. 56 g 0  . lidocaine-prilocaine (EMLA) cream  Apply 1 application topically as needed. Apply to [facility] cath at least one hour before needle stick. 30 g 2  . lisinopril-hydrochlorothiazide (PRINZIDE,ZESTORETIC) 10-12.5 MG tablet     . loperamide (IMODIUM) 2 MG capsule Take 2 capsules (4 mg total) by mouth 4 (four) times daily as needed for diarrhea or loose stools (NO MORE THAN 8 TABLETS PER DAY.). 60 capsule 3  . magic mouthwash SOLN Take 5 mLs by mouth 4 (four) times daily. 120 mL 1  . magnesium oxide (MAG-OX) 400 (241.3 Mg) MG tablet Take 1 tablet (400 mg total) by mouth 3 (three) times daily. 90 tablet 3  . metFORMIN (GLUCOPHAGE) 500 MG tablet Take 1 tablet (500 mg total) by mouth 2 (two) times daily with a meal. 60 tablet 0  . nadolol (CORGARD) 20 MG tablet Take 1 tablet (20 mg total) by mouth 2 (two) times daily. THIS IS A ONE TIME ORDER.  FUTURE REFILLS NEED TO BE DONE BY PRIMARY MD. 60 tablet 1  . ondansetron (ZOFRAN) 8 MG tablet Take 1 tablet (8 mg total) by mouth every 8 (eight) hours as needed for nausea or vomiting. 45 tablet 2  . pantoprazole (PROTONIX) 40 MG tablet Take 1 tablet (40 mg total) by mouth daily. 30 tablet 2  . prochlorperazine (COMPAZINE) 10 MG tablet Take 1 tablet (10 mg total) by mouth every 6 (six) hours as needed for nausea or vomiting. 30 tablet 2   Current Facility-Administered Medications on File Prior to Visit  Medication Dose Route Frequency Provider Last Rate Last Dose  . 0.9 %  sodium chloride infusion   Intravenous Once Truitt Merle, MD      . 0.9 %  sodium chloride infusion   Intravenous Once Truitt Merle, MD      . ferumoxytol [facility]) 510 mg in sodium chloride 0.9 % 100 mL IVPB  510 mg Intravenous Once Truitt Merle, MD      . fluorouracil (ADRUCIL) 3,050 mg in sodium chloride 0.9 % 89 mL chemo infusion  1,800 mg/m2 (Treatment Plan Recorded) Intravenous Q14 Days Truitt Merle, MD   3,050 mg at 10/22/16 1710  . sodium chloride 0.9 % injection 10 mL  10 mL Intracatheter PRN Truitt Merle, MD   10 mL at 10/03/15 1705  . sodium  chloride 0.9 % injection 10 mL  10 mL Intravenous PRN Truitt Merle, MD   10 mL at 05/14/16 1127  ;   REVIEW OF SYSTEMS:   Constitutional: Denies fevers, chills or abnormal night sweats, (+) fatigue  Eyes: Denies blurriness of vision, double vision or watery eyes Ears, nose, mouth, throat, and face: Denies mucositis or sore throat Respiratory: Denies cough, dyspnea or wheezes Cardiovascular: Denies palpitation, chest discomfort or lower extremity swelling Gastrointestinal: Denies nausea, heartburn or change in bowel habits Skin: Denies abnormal skin rashes Lymphatics: Denies new lymphadenopathy or easy bruising Neurological:Denies numbness, tingling or new weaknesses Behavioral/Psych: Mood is stable, no new changes, (+) insomnia All other systems were reviewed with the patient and are negative.  PHYSICAL EXAMINATION: BP (!) 141/62 (BP Location: Left Arm, Patient Position: Sitting)   Pulse 74   Temp 98.4 F (36.9 C) (Oral)   Resp 18   Ht _0  (1.651 m)   Wt 143 lb 1.6 oz (64.9 kg)   SpO2 92%   BMI 23.81 kg/m   ECOG PERFORMANCE STATUS: 1 GENERAL:alert, no distress and comfortable SKIN: (+) Dry skin,  no skin ulcer, (+) scatter skin rashes on his neck and upper chest, some are resolving with skin pigmentation. No discharge. EYES: normal, conjunctiva are pink and non-injected, sclera clear OROPHARYNX:no exudate, no erythema and lips, buccal mucosa, and tongue with mild discoloration at the tip. The low front teeth are missing  NECK: supple, thyroid normal size, non-tender, without nodularity LYMPH:  no palpable lymphadenopathy in the cervical, axillary or inguinal LUNGS: clear to auscultation and percussion with normal breathing effort, no rales   HEART: regular rate & rhythm and no murmurs and no lower extremity edema ABDOMEN:abdomen soft, non-tender, no hepatomegaly, no splenomegaly and normal bowel sounds Musculoskeletal:no cyanosis of digits and no clubbing  PSYCH: alert & oriented  x 3 with fluent speech NEURO: no focal motor/sensory deficits   LABORATORY DATA:  I have reviewed the data as listed CBC Latest Ref Rng & Units 10/22/2016 10/07/2016 09/23/2016  WBC 4.0 - 10.3 10e3/uL 4.8 5.8 4.7  Hemoglobin 13.0 - 17.1 g/dL 9.2(L) 9.5(L) 9.5(L)  Hematocrit 38.4 - 49.9 % 29.0(L) 30.2(L) 29.3(L)  Platelets 140 - 400 10e3/uL 193 211 163    CMP Latest Ref Rng & Units 10/22/2016 10/07/2016 09/23/2016  Glucose 70 - 140 mg/dl 173(H) 106 152(H)  BUN 7.0 - 26.0 mg/dL 13.4 11.3 10.2  Creatinine 0.7 - 1.3 mg/dL 0.8 0.8 0.8  Sodium 136 - 145 mEq/L 141 141 139  Potassium 3.5 - 5.1 mEq/L 4.8 4.4 4.4  Chloride 101 - 111 mmol/L - - -  CO2 22 - 29 mEq/L _1 Calcium 8.4 - 10.4 mg/dL 8.5 8.7 9.1  Total Protein 6.4 - 8.3 g/dL 7.7 7.7 7.6  Total Bilirubin 0.20 - 1.20 mg/dL 0.46 0.59 0.51  Alkaline Phos 40 - 150 U/L 222(H) 220(H) 220(H)  AST 5 - 34 U/L _2 ALT 0 - 55 U/L _3 INITIAL tumor markers AFP 3.2 CEA > 10,000 CA 19-9 12,929.6  CEA 01/11/2015: 1809 07/05/2015: 386 10/03/2015: 66.4 02/19/2016: 155.8 05/06/2016: 566 07/08/2016: 834 08/05/2016: 1102 10/07/2016: [place]  Pathology report  Liver, needle/core biopsy - METASTATIC ADENOCARCINOMA, SEE COMMENT. Microscopic Comment The adenocarcinoma demonstrates the following immunophenotype: Cytokeratin 7 - negative expression. Cytokeratin 20 - strong diffuse expression. CD2 - strong diffuse expression. Overall the morphology and immunophenotype are that of metastatic adenocarcinoma primary to colorectum. The recent nuclear  medicine scan demonstrating sigmoid mass with associated liver masses is noted.  [facility] test result:    RADIOGRAPHIC STUDIES: I have personally reviewed the outside CT scan image with patient and his son.   CT chest, abdomen and pelvis with IV contrast on  08/19/2016  IMPRESSION: 1. Respiratory motion degrades image quality in the lungs. Favor slight enlargement of a right  lower lobe pulmonary nodule as well as development of a new pulmonary nodule in the left upper lobe. 2. Hepatic metastatic disease is grossly stable. Associated marginal irregularity of the liver is likely due to hepatic fibrosis from treated metastatic disease, with evidence of portal hypertension. 3. Right adrenal nodule is stable to minimally enlarged. 4. Aortic atherosclerosis (ICD10-170.0). Coronary artery calcification.   ASSESSMENT & PLAN:  70 y.o. [place] male, with past history of hypertension and dilated nonischemic gammopathy with EF 25%, no clinical signs of heart failure, who was found to have hepatitis B infection lately, and multiple liver lesions on the CT scan. He has extremely high CEA and CA 19-9 levels. PET scan reviewed a hypermetabolic sigmoid colon mass, diffuse liver metastasis, probable lung and adrenal gland metastasis.  1. Metastatic sigmoid colon cancer, with diffuse liver, lungs, node and left adrenal gland metastases. KRAS/NRAS wild type, MSI-stable -Liver biopsy showed metastatic adenocarcinoma. His tumor were strongly positive for CK20 and CD2, consistent with primary colorectal primary. KRAS and NRAS mutations were not detected.  -Pt understands that this is incurable cancer, and he has very high disease burden and overall prognosis is poor. The treatment goal is palliative -I previously discussed his restaging CT from 08/19/2016 which showed overall stable disease in liver, a few small lung nodules, slightly bigger, will continue monitoring. -I think his scan showed overall some improvement since we restarted on 5-FU in August 2017. -His tumor marker CEA has been trending up, concerning for slow disease progression, we'll continue monitoring -He is clinically doing well, tolerating concurrent chemotherapy FOLFIRI and panitumumab very well, we'll continue for now. -Lab reviewed, adequate for treatment, we'll proceed to chemotherapy today -plan to repeat a  staging CT scan in 3-4 weeks    2. Grade 1-2 skin rashes, stable  -Secondary to panitumumab, stable overall  -continue hydrocortisone 2.5%, and clindamycin gel 1%  twice daily as needed  -He knows to avoid sun exposure, and call me if it gets worse.  3. Gastric ulcer with significant GI bleeding in April and Aug 2016 -He is on PPI, continue once daily  -Repeat his EGD on 08/15/2015 showed near complete healing of his gastric ulcer -continue Nadolol '20mg'$  bid, per Dr. Fuller Plan - I encourage him to follow-up with Dr. Fuller Plan [facility] monitor him closely since he has restarted 5-fu   4. Type 2 DM  -His glucose level has increased lately, I encouraged him to watch his diet, avoid any sweets, and monitor closely at home -I encourage him to follow-up with his primary care physician Dr. Jimmye Norman.  5. HTN, Dilated nonischemic ischemia cardiomyopathy with EF 25% -He is clinically doing well without symptoms of CHF. However this is probably going to impact his chemotherapy.Will try to avoid cardiotoxic chemotherapy agent and avoid fluid overload during chemotherapy. -Continue follow-up with cardiology. -will hold on his lisinopril-HCTZ for now, his BP has been normal lately   6 Hepatitis B carrier, with mild portal hypertension  -Per liver clinic, no need for treatment. Follow-up with Dr. Silvio Pate   7. Malnutrition -I encouraged him to eat more, and take supplements as needed. -improved,  gained weight back after I reduce his chemotherapy dose  -follow up with Dietitian   8. Anemia secondary to GI bleeding, iron deficiency and chemo  -Repeat lab on 06/06/2015 showed ferritin 117, serum iron 24, saturation 8%, which supports iron deficiency -He received IV Feraheme again in Aug 2016 after GI bleeding  -Repeat a ferritin was 273 on 10/03/2015, much improved  -he received iv feraheme again on 11/27/2015 and 12/04/2015, but anemia did not improve much. -His anemia has been slightly worse lately, probably  related to chemotherapy, we'll close monitor any signs of GI bleeding. -Blood transfusion if hemoglobin less than 8 or symptomatic anemia with hemoglobin 8-9  9. hypomagnesemia  -secondary to panitumumab -he receives IV mag 6 g every week  -He is taking magnesium pill 2 tablets 3 times a day -continue monitoring    Plan -Lab reviewed, adequate for treatment, we'll proceed FOLFIRI and panitumumab today and continue every 2 weeks.  -magnesium infusion weekly   -I'll see him back in 4 weeks with restaging CT CAP with contrast a few days before   All questions were answered. The patient knows to call the clinic with any problems, questions or concerns.  I spent 20 minutes counseling the patient face to face. The total time spent in the appointment was 25 minutes and more than 50% was on counseling.     Truitt Merle, MD 10/22/16

## 2016-10-22 NOTE — Telephone Encounter (Signed)
Message sent to chemo scheduler to be added per 10/22/16 los. Lab, flush and follow up scheduled, per 10/22/16 los. A copy of the AVS report and appointment schedule was given to the patient, per 10/22/16 los.

## 2016-10-22 NOTE — Patient Instructions (Signed)
[place] Discharge Instructions for Patients Receiving Chemotherapy  Today you received the following chemotherapy agents:  Vectibix, Irinotecan  To help prevent nausea and vomiting after your treatment, we encourage you to take your nausea medication as prescribed.   If you develop nausea and vomiting that is not controlled by your nausea medication, call the clinic.   BELOW ARE SYMPTOMS THAT SHOULD BE REPORTED IMMEDIATELY:  *FEVER GREATER THAN 100.5 F  *CHILLS WITH OR WITHOUT FEVER  NAUSEA AND VOMITING THAT IS NOT CONTROLLED WITH YOUR NAUSEA MEDICATION  *UNUSUAL SHORTNESS OF BREATH  *UNUSUAL BRUISING OR BLEEDING  TENDERNESS IN MOUTH AND THROAT WITH OR WITHOUT PRESENCE OF ULCERS  *URINARY PROBLEMS  *BOWEL PROBLEMS  UNUSUAL RASH Items with * indicate a potential emergency and should be followed up as soon as possible.  Feel free to call the clinic you have any questions or concerns. The clinic phone number is [phone].  Please show the [place] at check-in to the Emergency Department and triage nurse.

## 2016-10-23 ENCOUNTER — Ambulatory Visit (HOSPITAL_BASED_OUTPATIENT_CLINIC_OR_DEPARTMENT_OTHER): Payer: Medicare Other

## 2016-10-23 VITALS — BP 129/67 | HR 63 | Temp 97.9°F | Resp 16

## 2016-10-23 DIAGNOSIS — D509 Iron deficiency anemia, unspecified: Secondary | ICD-10-CM

## 2016-10-23 MED ORDER — SODIUM CHLORIDE 0.9 % IV SOLN
Freq: Once | INTRAVENOUS | Status: AC
  Administered 2016-10-23: 14:00:00 via INTRAVENOUS
  Filled 2016-10-23: qty 250

## 2016-10-23 NOTE — Patient Instructions (Signed)
Hypomagnesemia Introduction Hypomagnesemia is a condition in which the level of magnesium in the blood is low. Magnesium is a mineral that is found in many foods. It is used in many different processes in the body. Hypomagnesemia can affect every organ in the body. It can cause life-threatening problems. What are the causes? Causes of hypomagnesemia include:  Not getting enough magnesium in your diet.  Malnutrition.  Problems with absorbing magnesium from the intestines.  Dehydration.  Alcohol abuse.  Vomiting.  Severe diarrhea.  Some medicines, including medicines that make you urinate more.  Certain diseases, such as kidney disease, diabetes, and overactive thyroid. What are the signs or symptoms?  Involuntary shaking or trembling of a body part (tremor).  Confusion.  Muscle weakness.  Sensitivity to light, sound, and touch.  Psychiatric issues, such as depression, irritability, or psychosis.  Sudden tightening of muscles (muscle spasms).  Tingling in the arms and legs.  A feeling of fluttering of the heart. These symptoms are more severe if magnesium levels drop suddenly. How is this diagnosed? To make a diagnosis, your health care provider will do a physical exam and order blood and urine tests. How is this treated? Treatment will depend on the cause and the severity of your condition. It may involve:  A magnesium supplement. This can be taken in pill form. It can also be given through an IV tube. This is usually done if the condition is severe.  Changes to your diet. You may be directed to eat foods that have a lot of magnesium, such as green leafy vegetables, peas, beans, and nuts.  Eliminating alcohol from your diet. Follow these instructions at home:  Include foods with magnesium in your diet. Foods that are rich in magnesium include green vegetables, beans, nuts and seeds, and whole grains.  Take medicines only as directed by your health care  provider.  Take magnesium supplements if your health care provider instructs you to do that. Take them as directed.  Have your magnesium levels monitored as directed by your health care provider.  When you are active, drink fluids that contain electrolytes.  Keep all follow-up visits as directed by your health care provider. This is important. Contact a health care provider if:  You get worse instead of better.  Your symptoms return. Get help right away if:  Your symptoms are severe. This information is not intended to replace advice given to you by your health care provider. Make sure you discuss any questions you have with your health care provider. Document Released: 07/10/2005 Document Revised: 03/21/2016 Document Reviewed: 05/30/2014  2017 Elsevier

## 2016-10-24 ENCOUNTER — Other Ambulatory Visit: Payer: Self-pay | Admitting: *Deleted

## 2016-10-24 ENCOUNTER — Ambulatory Visit (HOSPITAL_BASED_OUTPATIENT_CLINIC_OR_DEPARTMENT_OTHER): Payer: Medicare Other

## 2016-10-24 VITALS — BP 142/64 | HR 60 | Temp 98.4°F | Resp 16

## 2016-10-24 DIAGNOSIS — C187 Malignant neoplasm of sigmoid colon: Secondary | ICD-10-CM | POA: Diagnosis not present

## 2016-10-24 DIAGNOSIS — Z452 Encounter for adjustment and management of vascular access device: Secondary | ICD-10-CM

## 2016-10-24 MED ORDER — HEPARIN SOD (PORK) LOCK FLUSH 100 UNIT/ML IV SOLN
500.0000 [IU] | Freq: Once | INTRAVENOUS | Status: AC | PRN
  Administered 2016-10-24: 500 [IU]
  Filled 2016-10-24: qty 5

## 2016-10-24 MED ORDER — SODIUM CHLORIDE 0.9 % IJ SOLN
10.0000 mL | INTRAMUSCULAR | Status: DC | PRN
  Administered 2016-10-24: 10 mL
  Filled 2016-10-24: qty 10

## 2016-10-24 MED ORDER — PANTOPRAZOLE SODIUM 40 MG PO TBEC: 40.0000 mg | DELAYED_RELEASE_TABLET | Freq: Every day | ORAL | 2 refills | Status: DC

## 2016-10-25 ENCOUNTER — Other Ambulatory Visit: Payer: Self-pay | Admitting: *Deleted

## 2016-10-25 DIAGNOSIS — C187 Malignant neoplasm of sigmoid colon: Secondary | ICD-10-CM

## 2016-10-25 MED ORDER — DIPHENOXYLATE-ATROPINE 2.5-0.025 MG PO TABS: 1.0000 | ORAL_TABLET | Freq: Four times a day (QID) | ORAL | 0 refills | Status: AC | PRN

## 2016-10-30 ENCOUNTER — Ambulatory Visit (HOSPITAL_BASED_OUTPATIENT_CLINIC_OR_DEPARTMENT_OTHER): Payer: Medicare Other

## 2016-10-30 VITALS — BP 114/67 | HR 71 | Temp 97.5°F | Resp 18

## 2016-10-30 DIAGNOSIS — Z95828 Presence of other vascular implants and grafts: Secondary | ICD-10-CM

## 2016-10-30 DIAGNOSIS — D509 Iron deficiency anemia, unspecified: Secondary | ICD-10-CM

## 2016-10-30 MED ORDER — MAGNESIUM SULFATE 50 % IJ SOLN
Freq: Once | INTRAMUSCULAR | Status: AC
  Administered 2016-10-30: 13:00:00 via INTRAVENOUS
  Filled 2016-10-30: qty 250

## 2016-10-30 MED ORDER — SODIUM CHLORIDE 0.9 % IJ SOLN
10.0000 mL | INTRAMUSCULAR | Status: DC | PRN
  Administered 2016-10-30: 10 mL via INTRAVENOUS
  Filled 2016-10-30: qty 10

## 2016-10-30 MED ORDER — HEPARIN SOD (PORK) LOCK FLUSH 100 UNIT/ML IV SOLN
500.0000 [IU] | Freq: Once | INTRAVENOUS | Status: AC | PRN
  Administered 2016-10-30: 500 [IU] via INTRAVENOUS
  Filled 2016-10-30: qty 5

## 2016-10-30 NOTE — Patient Instructions (Signed)
Hypomagnesemia Introduction Hypomagnesemia is a condition in which the level of magnesium in the blood is low. Magnesium is a mineral that is found in many foods. It is used in many different processes in the body. Hypomagnesemia can affect every organ in the body. It can cause life-threatening problems. What are the causes? Causes of hypomagnesemia include:  Not getting enough magnesium in your diet.  Malnutrition.  Problems with absorbing magnesium from the intestines.  Dehydration.  Alcohol abuse.  Vomiting.  Severe diarrhea.  Some medicines, including medicines that make you urinate more.  Certain diseases, such as kidney disease, diabetes, and overactive thyroid. What are the signs or symptoms?  Involuntary shaking or trembling of a body part (tremor).  Confusion.  Muscle weakness.  Sensitivity to light, sound, and touch.  Psychiatric issues, such as depression, irritability, or psychosis.  Sudden tightening of muscles (muscle spasms).  Tingling in the arms and legs.  A feeling of fluttering of the heart. These symptoms are more severe if magnesium levels drop suddenly. How is this diagnosed? To make a diagnosis, your health care provider will do a physical exam and order blood and urine tests. How is this treated? Treatment will depend on the cause and the severity of your condition. It may involve:  A magnesium supplement. This can be taken in pill form. It can also be given through an IV tube. This is usually done if the condition is severe.  Changes to your diet. You may be directed to eat foods that have a lot of magnesium, such as green leafy vegetables, peas, beans, and nuts.  Eliminating alcohol from your diet. Follow these instructions at home:  Include foods with magnesium in your diet. Foods that are rich in magnesium include green vegetables, beans, nuts and seeds, and whole grains.  Take medicines only as directed by your health care  provider.  Take magnesium supplements if your health care provider instructs you to do that. Take them as directed.  Have your magnesium levels monitored as directed by your health care provider.  When you are active, drink fluids that contain electrolytes.  Keep all follow-up visits as directed by your health care provider. This is important. Contact a health care provider if:  You get worse instead of better.  Your symptoms return. Get help right away if:  Your symptoms are severe. This information is not intended to replace advice given to you by your health care provider. Make sure you discuss any questions you have with your health care provider. Document Released: 07/10/2005 Document Revised: 03/21/2016 Document Reviewed: 05/30/2014  2017 Elsevier

## 2016-11-06 ENCOUNTER — Ambulatory Visit (HOSPITAL_COMMUNITY)
Admission: RE | Admit: 2016-11-06 | Discharge: 2016-11-06 | Disposition: A | Discharge status: Home / Self Care | Payer: Medicare Other | Source: Ambulatory Visit | Attending: Hematology | Admitting: Hematology

## 2016-11-06 ENCOUNTER — Ambulatory Visit (HOSPITAL_BASED_OUTPATIENT_CLINIC_OR_DEPARTMENT_OTHER): Payer: Medicare Other

## 2016-11-06 ENCOUNTER — Other Ambulatory Visit: Payer: Self-pay | Admitting: Hematology

## 2016-11-06 ENCOUNTER — Other Ambulatory Visit (HOSPITAL_BASED_OUTPATIENT_CLINIC_OR_DEPARTMENT_OTHER): Payer: Medicare Other

## 2016-11-06 VITALS — BP 131/69 | HR 79 | Temp 98.9°F | Resp 18

## 2016-11-06 DIAGNOSIS — Z452 Encounter for adjustment and management of vascular access device: Secondary | ICD-10-CM

## 2016-11-06 DIAGNOSIS — Z5111 Encounter for antineoplastic chemotherapy: Secondary | ICD-10-CM | POA: Diagnosis not present

## 2016-11-06 DIAGNOSIS — C787 Secondary malignant neoplasm of liver and intrahepatic bile duct: Secondary | ICD-10-CM | POA: Diagnosis not present

## 2016-11-06 DIAGNOSIS — C187 Malignant neoplasm of sigmoid colon: Secondary | ICD-10-CM | POA: Diagnosis not present

## 2016-11-06 DIAGNOSIS — Z95828 Presence of other vascular implants and grafts: Secondary | ICD-10-CM

## 2016-11-06 DIAGNOSIS — Z5112 Encounter for antineoplastic immunotherapy: Secondary | ICD-10-CM

## 2016-11-06 DIAGNOSIS — D649 Anemia, unspecified: Secondary | ICD-10-CM | POA: Insufficient documentation

## 2016-11-06 LAB — CBC WITH DIFFERENTIAL/PLATELET
BASO%: 0.6 % (ref 0.0–2.0)
BASOS ABS: 0 10*3/uL (ref 0.0–0.1)
EOS%: 4.7 % (ref 0.0–7.0)
Eosinophils Absolute: 0.2 10*3/uL (ref 0.0–0.5)
HCT: 23.7 % — ABNORMAL LOW (ref 38.4–49.9)
HGB: 7.6 g/dL — ABNORMAL LOW (ref 13.0–17.1)
LYMPH%: 16.8 % (ref 14.0–49.0)
MCH: 26.2 pg — AB (ref 27.2–33.4)
MCHC: 32.1 g/dL (ref 32.0–36.0)
MCV: 81.7 fL (ref 79.3–98.0)
MONO#: 0.4 10*3/uL (ref 0.1–0.9)
MONO%: 13 % (ref 0.0–14.0)
NEUT#: 2.1 10*3/uL (ref 1.5–6.5)
NEUT%: 64.9 % (ref 39.0–75.0)
PLATELETS: 135 10*3/uL — AB (ref 140–400)
RBC: 2.9 10*6/uL — AB (ref 4.20–5.82)
RDW: 17.4 % — ABNORMAL HIGH (ref 11.0–14.6)
WBC: 3.2 10*3/uL — ABNORMAL LOW (ref 4.0–10.3)
lymph#: 0.5 10*3/uL — ABNORMAL LOW (ref 0.9–3.3)
nRBC: 0 % (ref 0–0)

## 2016-11-06 LAB — COMPREHENSIVE METABOLIC PANEL
ALT: 20 U/L (ref 0–55)
ANION GAP: 9 meq/L (ref 3–11)
AST: 24 U/L (ref 5–34)
Albumin: 2.9 g/dL — ABNORMAL LOW (ref 3.5–5.0)
Alkaline Phosphatase: 198 U/L — ABNORMAL HIGH (ref 40–150)
BUN: 8.5 mg/dL (ref 7.0–26.0)
CALCIUM: 7.6 mg/dL — AB (ref 8.4–10.4)
CHLORIDE: 109 meq/L (ref 98–109)
CO2: 24 mEq/L (ref 22–29)
CREATININE: 0.7 mg/dL (ref 0.7–1.3)
EGFR: >90 mL/min/{1.73_m2} (ref >90)
Glucose: 170 mg/dl — ABNORMAL HIGH (ref 70–140)
POTASSIUM: 3.5 meq/L (ref 3.5–5.1)
Sodium: 142 mEq/L (ref 136–145)
Total Bilirubin: 0.47 mg/dL (ref 0.20–1.20)
Total Protein: 6.7 g/dL (ref 6.4–8.3)

## 2016-11-06 LAB — MAGNESIUM: Magnesium: 0.6 mg/dl — CL (ref 1.5–2.5)

## 2016-11-06 LAB — CEA (IN HOUSE-CHCC): CEA (CHCC-In House): 2214.95 ng/mL — ABNORMAL HIGH (ref 0.00–5.00)

## 2016-11-06 MED ORDER — SODIUM CHLORIDE 0.9 % IV SOLN
1800.0000 mg/m2 | INTRAVENOUS | Status: DC
  Administered 2016-11-06: 3050 mg via INTRAVENOUS
  Filled 2016-11-06: qty 61

## 2016-11-06 MED ORDER — DEXAMETHASONE SODIUM PHOSPHATE 10 MG/ML IJ SOLN
10.0000 mg | Freq: Once | INTRAMUSCULAR | Status: AC
  Administered 2016-11-06: 10 mg via INTRAVENOUS

## 2016-11-06 MED ORDER — SODIUM CHLORIDE 0.9 % IV SOLN
6.1000 mg/kg | Freq: Once | INTRAVENOUS | Status: AC
  Administered 2016-11-06: 400 mg via INTRAVENOUS
  Filled 2016-11-06: qty 20

## 2016-11-06 MED ORDER — ATROPINE SULFATE 1 MG/ML IJ SOLN
0.5000 mg | Freq: Once | INTRAMUSCULAR | Status: AC | PRN
  Administered 2016-11-06: 0.5 mg via INTRAVENOUS

## 2016-11-06 MED ORDER — SODIUM CHLORIDE 0.9 % IV SOLN
Freq: Once | INTRAVENOUS | Status: AC
  Administered 2016-11-06: 11:00:00 via INTRAVENOUS

## 2016-11-06 MED ORDER — ONDANSETRON HCL 8 MG PO TABS
8.0000 mg | ORAL_TABLET | Freq: Once | ORAL | Status: AC
  Administered 2016-11-06: 8 mg via ORAL

## 2016-11-06 MED ORDER — ONDANSETRON HCL 8 MG PO TABS
ORAL_TABLET | ORAL | Status: AC
  Filled 2016-11-06: qty 1

## 2016-11-06 MED ORDER — SODIUM CHLORIDE 0.9 % IJ SOLN
10.0000 mL | INTRAMUSCULAR | Status: DC | PRN
  Filled 2016-11-06: qty 10

## 2016-11-06 MED ORDER — IRINOTECAN HCL CHEMO INJECTION 100 MG/5ML: 140.0000 mg/m2 | Freq: Once | INTRAVENOUS | Status: DC

## 2016-11-06 MED ORDER — SODIUM CHLORIDE 0.9 % IJ SOLN
10.0000 mL | INTRAMUSCULAR | Status: DC | PRN
  Administered 2016-11-06: 10 mL via INTRAVENOUS
  Filled 2016-11-06: qty 10

## 2016-11-06 MED ORDER — HEPARIN SOD (PORK) LOCK FLUSH 100 UNIT/ML IV SOLN
500.0000 [IU] | Freq: Once | INTRAVENOUS | Status: DC | PRN
  Filled 2016-11-06: qty 5

## 2016-11-06 MED ORDER — DEXAMETHASONE SODIUM PHOSPHATE 10 MG/ML IJ SOLN
INTRAMUSCULAR | Status: AC
  Filled 2016-11-06: qty 1

## 2016-11-06 MED ORDER — IRINOTECAN HCL CHEMO INJECTION 100 MG/5ML
140.0000 mg/m2 | Freq: Once | INTRAVENOUS | Status: AC
  Administered 2016-11-06: 240 mg via INTRAVENOUS
  Filled 2016-11-06: qty 10

## 2016-11-06 MED ORDER — ATROPINE SULFATE 1 MG/ML IJ SOLN
INTRAMUSCULAR | Status: AC
  Filled 2016-11-06: qty 1

## 2016-11-06 MED ORDER — IRINOTECAN HCL CHEMO INJECTION 100 MG/5ML
140.0000 mg/m2 | Freq: Once | INTRAVENOUS | Status: DC
Start: 2016-11-06 — End: 2016-11-06

## 2016-11-06 NOTE — Patient Instructions (Signed)
[place] Discharge Instructions for Patients Receiving Chemotherapy  Today you received the following chemotherapy agents:  Vectibix, Irinotecan, and Adrucil.  To help prevent nausea and vomiting after your treatment, we encourage you to take your nausea medication as prescribed.   If you develop nausea and vomiting that is not controlled by your nausea medication, call the clinic.   BELOW ARE SYMPTOMS THAT SHOULD BE REPORTED IMMEDIATELY:  *FEVER GREATER THAN 100.5 F  *CHILLS WITH OR WITHOUT FEVER  NAUSEA AND VOMITING THAT IS NOT CONTROLLED WITH YOUR NAUSEA MEDICATION  *UNUSUAL SHORTNESS OF BREATH  *UNUSUAL BRUISING OR BLEEDING  TENDERNESS IN MOUTH AND THROAT WITH OR WITHOUT PRESENCE OF ULCERS  *URINARY PROBLEMS  *BOWEL PROBLEMS  UNUSUAL RASH Items with * indicate a potential emergency and should be followed up as soon as possible.  Feel free to call the clinic you have any questions or concerns. The clinic phone number is [phone].  Please show the [place] at check-in to the Emergency Department and triage nurse.

## 2016-11-06 NOTE — Progress Notes (Signed)
Per [name], ok to treat patient today with Hbg of 7.6. He will be scheduled to receive blood on Saturday.   Also, treat with vectibix today regardless of Magnesium.

## 2016-11-06 NOTE — Patient Instructions (Signed)

## 2016-11-08 ENCOUNTER — Encounter (HOSPITAL_COMMUNITY): Payer: Medicare Other

## 2016-11-08 ENCOUNTER — Telehealth: Payer: Self-pay | Admitting: Hematology

## 2016-11-08 ENCOUNTER — Ambulatory Visit (HOSPITAL_BASED_OUTPATIENT_CLINIC_OR_DEPARTMENT_OTHER): Payer: Medicare Other

## 2016-11-08 ENCOUNTER — Other Ambulatory Visit: Payer: Self-pay | Admitting: *Deleted

## 2016-11-08 ENCOUNTER — Telehealth: Payer: Self-pay | Admitting: *Deleted

## 2016-11-08 VITALS — BP 124/56 | HR 62 | Temp 98.1°F | Resp 17

## 2016-11-08 DIAGNOSIS — C187 Malignant neoplasm of sigmoid colon: Secondary | ICD-10-CM

## 2016-11-08 DIAGNOSIS — Z95828 Presence of other vascular implants and grafts: Secondary | ICD-10-CM

## 2016-11-08 DIAGNOSIS — D509 Iron deficiency anemia, unspecified: Secondary | ICD-10-CM

## 2016-11-08 MED ORDER — HEPARIN SOD (PORK) LOCK FLUSH 100 UNIT/ML IV SOLN
500.0000 [IU] | Freq: Once | INTRAVENOUS | Status: AC | PRN
  Administered 2016-11-08: 500 [IU] via INTRAVENOUS
  Filled 2016-11-08: qty 5

## 2016-11-08 MED ORDER — SODIUM CHLORIDE 0.9 % IJ SOLN
10.0000 mL | INTRAMUSCULAR | Status: DC | PRN
  Administered 2016-11-08: 10 mL via INTRAVENOUS
  Filled 2016-11-08: qty 10

## 2016-11-08 MED ORDER — SODIUM CHLORIDE 0.9 % IV SOLN
Freq: Once | INTRAVENOUS | Status: AC
  Administered 2016-11-08: 11:00:00 via INTRAVENOUS
  Filled 2016-11-08: qty 250

## 2016-11-08 MED ORDER — SODIUM CHLORIDE 0.9 % IV SOLN: 6.0000 g | Freq: Once | INTRAVENOUS | Status: DC

## 2016-11-08 NOTE — Patient Instructions (Signed)
Hypomagnesemia Introduction Hypomagnesemia is a condition in which the level of magnesium in the blood is low. Magnesium is a mineral that is found in many foods. It is used in many different processes in the body. Hypomagnesemia can affect every organ in the body. It can cause life-threatening problems. What are the causes? Causes of hypomagnesemia include:  Not getting enough magnesium in your diet.  Malnutrition.  Problems with absorbing magnesium from the intestines.  Dehydration.  Alcohol abuse.  Vomiting.  Severe diarrhea.  Some medicines, including medicines that make you urinate more.  Certain diseases, such as kidney disease, diabetes, and overactive thyroid. What are the signs or symptoms?  Involuntary shaking or trembling of a body part (tremor).  Confusion.  Muscle weakness.  Sensitivity to light, sound, and touch.  Psychiatric issues, such as depression, irritability, or psychosis.  Sudden tightening of muscles (muscle spasms).  Tingling in the arms and legs.  A feeling of fluttering of the heart. These symptoms are more severe if magnesium levels drop suddenly. How is this diagnosed? To make a diagnosis, your health care provider will do a physical exam and order blood and urine tests. How is this treated? Treatment will depend on the cause and the severity of your condition. It may involve:  A magnesium supplement. This can be taken in pill form. It can also be given through an IV tube. This is usually done if the condition is severe.  Changes to your diet. You may be directed to eat foods that have a lot of magnesium, such as green leafy vegetables, peas, beans, and nuts.  Eliminating alcohol from your diet. Follow these instructions at home:  Include foods with magnesium in your diet. Foods that are rich in magnesium include green vegetables, beans, nuts and seeds, and whole grains.  Take medicines only as directed by your health care  provider.  Take magnesium supplements if your health care provider instructs you to do that. Take them as directed.  Have your magnesium levels monitored as directed by your health care provider.  When you are active, drink fluids that contain electrolytes.  Keep all follow-up visits as directed by your health care provider. This is important. Contact a health care provider if:  You get worse instead of better.  Your symptoms return. Get help right away if:  Your symptoms are severe. This information is not intended to replace advice given to you by your health care provider. Make sure you discuss any questions you have with your health care provider. Document Released: 07/10/2005 Document Revised: 03/21/2016 Document Reviewed: 05/30/2014  2017 Elsevier

## 2016-11-08 NOTE — Telephone Encounter (Signed)
Per 1/12 los - cx transfusion for tomorrow and schedule patient for lab/PRBC's early next week.  Patient already set up for lab at [facility] and PRBC's at [facility] 1/15. Not other orders per 1/12 schedule message.

## 2016-11-08 NOTE — Telephone Encounter (Signed)
Spoke with son and instructed him to bring pt in on Mon 11/11/16 at 0900 for STAT XXM for blood transfusion at [facility] at 1000 am.   Son voiced understanding.

## 2016-11-11 ENCOUNTER — Other Ambulatory Visit (HOSPITAL_BASED_OUTPATIENT_CLINIC_OR_DEPARTMENT_OTHER): Payer: Medicare Other

## 2016-11-11 ENCOUNTER — Ambulatory Visit (HOSPITAL_COMMUNITY)
Admission: RE | Admit: 2016-11-11 | Discharge: 2016-11-11 | Disposition: A | Discharge status: Home / Self Care | Payer: Medicare Other | Source: Ambulatory Visit | Attending: Hematology | Admitting: Hematology

## 2016-11-11 DIAGNOSIS — C187 Malignant neoplasm of sigmoid colon: Secondary | ICD-10-CM

## 2016-11-11 DIAGNOSIS — D649 Anemia, unspecified: Secondary | ICD-10-CM | POA: Diagnosis not present

## 2016-11-11 LAB — CBC WITH DIFFERENTIAL/PLATELET
BASO%: 0.5 % (ref 0.0–2.0)
BASOS ABS: 0 10*3/uL (ref 0.0–0.1)
EOS%: 5.6 % (ref 0.0–7.0)
Eosinophils Absolute: 0.3 10*3/uL (ref 0.0–0.5)
HEMATOCRIT: 30.8 % — AB (ref 38.4–49.9)
HEMOGLOBIN: 10 g/dL — AB (ref 13.0–17.1)
LYMPH#: 0.9 10*3/uL (ref 0.9–3.3)
LYMPH%: 19.5 % (ref 14.0–49.0)
MCH: 26.9 pg — AB (ref 27.2–33.4)
MCHC: 32.6 g/dL (ref 32.0–36.0)
MCV: 82.6 fL (ref 79.3–98.0)
MONO#: 0.4 10*3/uL (ref 0.1–0.9)
MONO%: 9 % (ref 0.0–14.0)
NEUT#: 3 10*3/uL (ref 1.5–6.5)
NEUT%: 65.4 % (ref 39.0–75.0)
PLATELETS: 269 10*3/uL (ref 140–400)
RBC: 3.73 10*6/uL — ABNORMAL LOW (ref 4.20–5.82)
RDW: 19.2 % — AB (ref 11.0–14.6)
WBC: 4.6 10*3/uL (ref 4.0–10.3)

## 2016-11-11 LAB — PREPARE RBC (CROSSMATCH)

## 2016-11-11 MED ORDER — ACETAMINOPHEN 325 MG PO TABS
650.0000 mg | ORAL_TABLET | Freq: Once | ORAL | Status: AC
  Administered 2016-11-11: 650 mg via ORAL
  Filled 2016-11-11: qty 2

## 2016-11-11 MED ORDER — SODIUM CHLORIDE 0.9 % IV SOLN
250.0000 mL | Freq: Once | INTRAVENOUS | Status: AC
  Administered 2016-11-11: 250 mL via INTRAVENOUS

## 2016-11-11 MED ORDER — SODIUM CHLORIDE 0.9% FLUSH
10.0000 mL | Freq: Once | INTRAVENOUS | Status: AC
  Administered 2016-11-11: 10 mL

## 2016-11-11 MED ORDER — HEPARIN SOD (PORK) LOCK FLUSH 100 UNIT/ML IV SOLN
500.0000 [IU] | Freq: Once | INTRAVENOUS | Status: AC
  Administered 2016-11-11: 500 [IU]
  Filled 2016-11-11: qty 5

## 2016-11-11 NOTE — Progress Notes (Signed)
Diagnosis: Cancer of sigmoid colon ([place]) (C18.7) MD: Dr. Truitt Merle MRN: [id number]  Procedure: Pt's PAC accessed per sterile technique.  Pre-med of APAP given as ordered.  Pt received 2 units PRBCs without difficulty.  PAC de-accessed and flushed with heparin per order.  Disposition:  Pt tolerated transfusion well.  Pt taken to radiology for instructions when to consume contrast for his CT scan tomorrow.  Daughter to take pt home.  Harika Laidlaw, Jaynie Bream

## 2016-11-11 NOTE — Discharge Instructions (Signed)

## 2016-11-12 ENCOUNTER — Ambulatory Visit (HOSPITAL_COMMUNITY)
Admission: RE | Admit: 2016-11-12 | Discharge: 2016-11-12 | Disposition: A | Discharge status: Home / Self Care | Payer: Medicare Other | Source: Ambulatory Visit | Attending: Hematology | Admitting: Hematology

## 2016-11-12 DIAGNOSIS — I700 Atherosclerosis of aorta: Secondary | ICD-10-CM | POA: Insufficient documentation

## 2016-11-12 DIAGNOSIS — C187 Malignant neoplasm of sigmoid colon: Secondary | ICD-10-CM

## 2016-11-12 DIAGNOSIS — C189 Malignant neoplasm of colon, unspecified: Secondary | ICD-10-CM | POA: Insufficient documentation

## 2016-11-12 LAB — TYPE AND SCREEN
ABO/RH(D): O POS
Antibody Screen: NEGATIVE
Unit division: 0
Unit division: 0

## 2016-11-12 MED ORDER — IOPAMIDOL (ISOVUE-300) INJECTION 61%
INTRAVENOUS | Status: AC
  Filled 2016-11-12: qty 100

## 2016-11-12 MED ORDER — IOPAMIDOL (ISOVUE-300) INJECTION 61%
100.0000 mL | Freq: Once | INTRAVENOUS | Status: AC | PRN
  Administered 2016-11-12: 100 mL via INTRAVENOUS

## 2016-11-15 ENCOUNTER — Ambulatory Visit (HOSPITAL_BASED_OUTPATIENT_CLINIC_OR_DEPARTMENT_OTHER): Payer: Medicare Other

## 2016-11-15 ENCOUNTER — Ambulatory Visit (HOSPITAL_BASED_OUTPATIENT_CLINIC_OR_DEPARTMENT_OTHER): Payer: Medicare Other | Admitting: Nurse Practitioner

## 2016-11-15 VITALS — BP 114/73 | HR 95 | Temp 99.1°F | Resp 18

## 2016-11-15 DIAGNOSIS — E860 Dehydration: Secondary | ICD-10-CM

## 2016-11-15 DIAGNOSIS — C786 Secondary malignant neoplasm of retroperitoneum and peritoneum: Secondary | ICD-10-CM

## 2016-11-15 DIAGNOSIS — T829XXA Unspecified complication of cardiac and vascular prosthetic device, implant and graft, initial encounter: Secondary | ICD-10-CM

## 2016-11-15 DIAGNOSIS — C187 Malignant neoplasm of sigmoid colon: Secondary | ICD-10-CM

## 2016-11-15 DIAGNOSIS — Z95828 Presence of other vascular implants and grafts: Secondary | ICD-10-CM

## 2016-11-15 DIAGNOSIS — D509 Iron deficiency anemia, unspecified: Secondary | ICD-10-CM

## 2016-11-15 MED ORDER — MAGNESIUM SULFATE 50 % IJ SOLN: 6.0000 g | Freq: Once | INTRAMUSCULAR | Status: DC

## 2016-11-15 MED ORDER — SODIUM CHLORIDE 0.9 % IV SOLN
Freq: Once | INTRAVENOUS | Status: AC
  Administered 2016-11-15: 09:00:00 via INTRAVENOUS
  Filled 2016-11-15: qty 250

## 2016-11-15 MED ORDER — SODIUM CHLORIDE 0.9 % IJ SOLN
10.0000 mL | INTRAMUSCULAR | Status: DC | PRN
  Administered 2016-11-15: 10 mL via INTRAVENOUS
  Filled 2016-11-15: qty 10

## 2016-11-15 MED ORDER — HEPARIN SOD (PORK) LOCK FLUSH 100 UNIT/ML IV SOLN
500.0000 [IU] | Freq: Once | INTRAVENOUS | Status: AC | PRN
Start: 2016-11-15 — End: 2016-11-15
  Administered 2016-11-15: 500 [IU] via INTRAVENOUS
  Filled 2016-11-15: qty 5

## 2016-11-15 MED ORDER — ALTEPLASE 2 MG IJ SOLR
2.0000 mg | Freq: Once | INTRAMUSCULAR | Status: DC | PRN
  Filled 2016-11-15: qty 2

## 2016-11-15 NOTE — Patient Instructions (Signed)
Dehydration, Adult Dehydration is a condition in which there is not enough fluid or water in the body. This happens when you lose more fluids than you take in. Important organs, such as the kidneys, brain, and heart, cannot function without a proper amount of fluids. Any loss of fluids from the body can lead to dehydration. Dehydration can range from mild to severe. This condition should be treated right away to prevent it from becoming severe. What are the causes? This condition may be caused by:  Vomiting.  Diarrhea.  Excessive sweating, such as from heat exposure or exercise.  Not drinking enough fluid, especially:  When ill.  While doing activity that requires a lot of energy.  Excessive urination.  Fever.  Infection.  Certain medicines, such as medicines that cause the body to lose excess fluid (diuretics).  Inability to access safe drinking water.  Reduced physical ability to get adequate water and food. What increases the risk? This condition is more likely to develop in people:  Who have a poorly controlled long-term (chronic) illness, such as diabetes, heart disease, or kidney disease.  Who are age 65 or older.  Who are disabled.  Who live in a place with high altitude.  Who play endurance sports. What are the signs or symptoms? Symptoms of mild dehydration may include:   Thirst.  Dry lips.  Slightly dry mouth.  Dry, warm skin.  Dizziness. Symptoms of moderate dehydration may include:   Very dry mouth.  Muscle cramps.  Dark urine. Urine may be the color of tea.  Decreased urine production.  Decreased tear production.  Heartbeat that is irregular or faster than normal (palpitations).  Headache.  Light-headedness, especially when you stand up from a sitting position.  Fainting (syncope). Symptoms of severe dehydration may include:   Changes in skin, such as:  Cold and clammy skin.  Blotchy (mottled) or pale skin.  Skin that does  not quickly return to normal after being lightly pinched and released (poor skin turgor).  Changes in body fluids, such as:  Extreme thirst.  No tear production.  Inability to sweat when body temperature is high, such as in hot weather.  Very little urine production.  Changes in vital signs, such as:  Weak pulse.  Pulse that is more than 100 beats a minute when sitting still.  Rapid breathing.  Low blood pressure.  Other changes, such as:  Sunken eyes.  Cold hands and feet.  Confusion.  Lack of energy (lethargy).  Difficulty waking up from sleep.  Short-term weight loss.  Unconsciousness. How is this diagnosed? This condition is diagnosed based on your symptoms and a physical exam. Blood and urine tests may be done to help confirm the diagnosis. How is this treated? Treatment for this condition depends on the severity. Mild or moderate dehydration can often be treated at home. Treatment should be started right away. Do not wait until dehydration becomes severe. Severe dehydration is an emergency and it needs to be treated in a hospital. Treatment for mild dehydration may include:   Drinking more fluids.  Replacing salts and minerals in your blood (electrolytes) that you may have lost. Treatment for moderate dehydration may include:   Drinking an oral rehydration solution (ORS). This is a drink that helps you replace fluids and electrolytes (rehydrate). It can be found at pharmacies and retail stores. Treatment for severe dehydration may include:   Receiving fluids through an IV tube.  Receiving an electrolyte solution through a feeding tube that is   passed through your nose and into your stomach (nasogastric tube, or NG tube).  Correcting any abnormalities in electrolytes.  Treating the underlying cause of dehydration. Follow these instructions at home:  If directed by your health care provider, drink an ORS:  Make an ORS by following instructions on the  package.  Start by drinking small amounts, about  cup (120 mL) every 5-10 minutes.  Slowly increase how much you drink until you have taken the amount recommended by your health care provider.  Drink enough clear fluid to keep your urine clear or pale yellow. If you were told to drink an ORS, finish the ORS first, then start slowly drinking other clear fluids. Drink fluids such as:  Water. Do not drink only water. Doing that can lead to having too little salt (sodium) in the body (hyponatremia).  Ice chips.  Fruit juice that you have added water to (diluted fruit juice).  Low-calorie sports drinks.  Avoid:  Alcohol.  Drinks that contain a lot of sugar. These include high-calorie sports drinks, fruit juice that is not diluted, and soda.  Caffeine.  Foods that are greasy or contain a lot of fat or sugar.  Take over-the-counter and prescription medicines only as told by your health care provider.  Do not take sodium tablets. This can lead to having too much sodium in the body (hypernatremia).  Eat foods that contain a healthy balance of electrolytes, such as bananas, oranges, potatoes, tomatoes, and spinach.  Keep all follow-up visits as told by your health care provider. This is important. Contact a health care provider if:  You have abdominal pain that:  Gets worse.  Stays in one area (localizes).  You have a rash.  You have a stiff neck.  You are more irritable than usual.  You are sleepier or more difficult to wake up than usual.  You feel weak or dizzy.  You feel very thirsty.  You have urinated only a small amount of very dark urine over 6-8 hours. Get help right away if:  You have symptoms of severe dehydration.  You cannot drink fluids without vomiting.  Your symptoms get worse with treatment.  You have a fever.  You have a severe headache.  You have vomiting or diarrhea that:  Gets worse.  Does not go away.  You have blood or green matter  (bile) in your vomit.  You have blood in your stool. This may cause stool to look black and tarry.  You have not urinated in 6-8 hours.  You faint.  Your heart rate while sitting still is over 100 beats a minute.  You have trouble breathing. This information is not intended to replace advice given to you by your health care provider. Make sure you discuss any questions you have with your health care provider. Document Released: 10/14/2005 Document Revised: 05/10/2016 Document Reviewed: 12/08/2015 Elsevier Interactive Patient Education  2017 [employer]  

## 2016-11-18 ENCOUNTER — Encounter: Payer: Self-pay | Admitting: Nurse Practitioner

## 2016-11-18 DIAGNOSIS — T829XXA Unspecified complication of cardiac and vascular prosthetic device, implant and graft, initial encounter: Secondary | ICD-10-CM | POA: Insufficient documentation

## 2016-11-18 NOTE — Assessment & Plan Note (Signed)
Patient presented to the [facility] today to receive IV fluid rehydration.  The infusion room nurse call this provider over 2 check on patient's right upper chest Port-A-Cath site.  It appears that patient's right upper chest Port-A-Cath site was previously irritated and  now has a thin scab covering the port site.  No evidence of erythema, edema, red streaks, or tenderness to the site.  This provider approved accessing the Port-A-Cath site for IV fluid rehydration today.  Patient was advised to call/return if he develops any worsening issues with the site.

## 2016-11-18 NOTE — Assessment & Plan Note (Signed)
Patient presented to the [facility] today to receive IV fluid rehydration.

## 2016-11-18 NOTE — Assessment & Plan Note (Signed)
Patient last received his chemotherapy on 11/06/2016.  He is scheduled to return on 11/19/2016 for labs and flush.  He is scheduled for a visit and his next chemotherapy on 11/20/2016.

## 2016-11-18 NOTE — Progress Notes (Signed)
SYMPTOM MANAGEMENT CLINIC    Chief Complaint: Port-A-Cath site complications  HPI:  Jason Reilly 71 y.o. male diagnosed with colon cancer.  Currently undergoing irinotecan/vectibix chemotherapy regimen.   Oncology History   He is a Metastatic colon cancer to liver   Staging form: Colon and Rectum, AJCC 7th Edition     Clinical: Stage Unknown (TX, NX, M1) - Unsigned      Cancer of sigmoid colon ([place])   10/28/2014 Tumor Marker    AFP 3.2 CEA > 10,000 CA 19-9 12,929.6. tumor (-) KRAS and NRAS mutation.       11/10/2014 Imaging    PET scan showed hypermetabolic mass in the sigmoid colon was noted metastasis in the retroperitoneum. Probable left adrenal and pulmonary metastasis, and diffuse liver metastasis.      11/21/2014 Initial Diagnosis    Metastatic colon cancer to liver, lung, abd nodes and left adrenal gland. Diagnosis was made by liver biopsy.       11/30/2014 - 05/23/2015 Chemotherapy    First line chemo [id number], Panitumumab added from second cycle, chemo held due to recurrent GI bleeding        12/28/2014 - 12/31/2014 Hospital Admission    Was admitted for dehydration, neutropenia fever with UTI, and severe skin rash.      02/08/2015 - 02/12/2015 Hospital Admission    He was admitted for upper GI bleeding, e.g. showed a gastric ulcer with clots, status post appendectomy injection. He also received a blood transfusion.      03/01/2015 Tumor Marker    CEA 694, CA19.9 462      03/13/2015 Imaging    CT CAP showed partial response, no new lesions.       05/31/2015 - 06/02/2015 Hospital Admission    He was admitted to [facility] in [place] due to upper GI bleeding, EGD showed gastric and dudenal ulcers       06/27/2015 - 06/29/2015 Hospital Admission    he was admitted for dairrhea and pancolitis, c-diff and stool cultures were negative, treated with antibiotics      07/12/2015 -  Chemotherapy    panitumumab 22m/kg, every 2 weeks      09/19/2015 -  Chemotherapy   Irinotecan 1860mm2 every 2 weeks, dose reduction due to diarrhea. 5-Fu added on 06/10/2016 due to slight disease progression       11/08/2015 - 11/10/2015 Hospital Admission    Pt was admitted for sepsis and hypotension, was found to have (+) influenza A and treated.       11/30/2015 Imaging     CT scans reviewed continued improvement in the liver metastasis, no other new lesions.      05/06/2016 Tumor Marker    CEA has gradually increased from 110 in March 2017 to 566 in July 2070      06/04/2016 Imaging    Restaging CT scan showed similar to mild increase in hepatic metastasis, mild enlargement of right adrenal nodule, primary similar pulmonary nodules. A left upper lobe nodule has resolved.       Review of Systems  Skin:       Port-A-Cath site scabbing.  All other systems reviewed and are negative.   Past Medical History:  Diagnosis Date  . Abnormal EKG   . Diabetes mellitus without complication (HCC)    metformin  . Gastric ulcer   . H. pylori infection   . Hepatitis B   . Hypertension   . Metastatic colon cancer to liver ([place]  .  Non-ischemic cardiomyopathy ([place])   . Tachycardia     Past Surgical History:  Procedure Laterality Date  . ESOPHAGOGASTRODUODENOSCOPY N/A 02/08/2015   Procedure: ESOPHAGOGASTRODUODENOSCOPY (EGD);  Surgeon: Ladene Artist, MD;  Location: [facility];  Service: Endoscopy;  Laterality: N/A;  . ESOPHAGOGASTRODUODENOSCOPY (EGD) WITH PROPOFOL N/A 08/15/2015   Procedure: ESOPHAGOGASTRODUODENOSCOPY (EGD) WITH PROPOFOL;  Surgeon: Ladene Artist, MD;  Location: [facility];  Service: Endoscopy;  Laterality: N/A;  . LEFT AND RIGHT HEART CATHETERIZATION WITH CORONARY ANGIOGRAM N/A 03/29/2013   Procedure: LEFT AND RIGHT HEART CATHETERIZATION WITH CORONARY ANGIOGRAM;  Surgeon: Pixie Casino, MD;  Location: [facility];  Service: Cardiovascular;  Laterality: N/A;  . Nuclear Stress Test  03/03/2013   High risk - consistent with nonischemic cardiomyopathy      has Hypertension; Abnormal nuclear cardiac imaging test; Acute systolic heart failure ([place]); Non-ischemic cardiomyopathy ([place]); Cancer of sigmoid colon ([place]); Acute renal failure ([place]); Hyponatremia; Weakness; Acute blood loss anemia; Leukocytosis; Chronic combined systolic and diastolic heart failure ([place]); Arterial hypotension; Gastric ulcer with hemorrhage but without obstruction; Upper GI bleed; Absolute anemia; Nausea with vomiting; Dehydration; Hyperphosphatemia; Rash; Anemia, iron deficiency; Acute colitis; Duodenal ulcer without hemorrhage, perforation, or obstruction; Esophageal varices ([place]); Gastric ulcer with hemorrhage; Hypotension; Sepsis ([place]); Acute on chronic systolic (congestive) heart failure; Leukopenia due to antineoplastic chemotherapy ([place]); Influenza A; Diabetes mellitus, type 2 ([place]); Hypomagnesemia; Hypoalbuminemia due to protein-calorie malnutrition ([place]); Neoplastic malignant related fatigue; Diarrhea; Port catheter in place; Abdominal pain; and Central line complication on his problem list.    has No Known Allergies.  Allergies as of 11/15/2016   No Known Allergies     Medication List       Accurate as of 11/15/16 11:59 PM. Always use your most recent med list.          clindamycin 1 % gel Commonly known as:  CLINDAGEL Apply topically 2 (two) times daily.   diphenoxylate-atropine 2.5-0.025 MG tablet Commonly known as:  LOMOTIL Take 1-2 tablets by mouth 4 (four) times daily as needed for diarrhea or loose stools.   folic acid 1 MG tablet Commonly known as:  FOLVITE Take 1 tablet (1 mg total) by mouth daily.   hydrocortisone 1 % ointment Apply 1 application topically 2 (two) times daily.   lidocaine-prilocaine cream Commonly known as:  EMLA Apply 1 application topically as needed. Apply to [facility] cath at least one hour before needle stick.   lisinopril-hydrochlorothiazide 10-12.5 MG tablet Commonly known as:  PRINZIDE,ZESTORETIC   loperamide 2 MG  capsule Commonly known as:  IMODIUM Take 2 capsules (4 mg total) by mouth 4 (four) times daily as needed for diarrhea or loose stools (NO MORE THAN 8 TABLETS PER DAY.).   magic mouthwash Soln Take 5 mLs by mouth 4 (four) times daily.   magnesium oxide 400 (241.3 Mg) MG tablet Commonly known as:  MAG-OX Take 1 tablet (400 mg total) by mouth 3 (three) times daily.   metFORMIN 500 MG tablet Commonly known as:  GLUCOPHAGE Take 1 tablet (500 mg total) by mouth 2 (two) times daily with a meal.   nadolol 20 MG tablet Commonly known as:  CORGARD Take 1 tablet (20 mg total) by mouth 2 (two) times daily. THIS IS A ONE TIME ORDER.  FUTURE REFILLS NEED TO BE DONE BY PRIMARY MD.   ondansetron 8 MG tablet Commonly known as:  ZOFRAN Take 1 tablet (8 mg total) by mouth every 8 (eight) hours as needed for nausea or vomiting.  pantoprazole 40 MG tablet Commonly known as:  PROTONIX Take 1 tablet (40 mg total) by mouth daily.   prochlorperazine 10 MG tablet Commonly known as:  COMPAZINE Take 1 tablet (10 mg total) by mouth every 6 (six) hours as needed for nausea or vomiting.        PHYSICAL EXAMINATION  Oncology Vitals 11/15/2016 11/11/2016  Height - -  Weight - -  Weight (lbs) - -  BMI (kg/m2) - -  Temp 99.1 98.6  Pulse 95 61  Resp 18 18  SpO2 99 100  BSA (m2) - -   BP Readings from Last 2 Encounters:  11/15/16 114/73  11/11/16 137/65    Physical Exam  Constitutional: He is oriented to person, place, and time and well-developed, well-nourished, and in no distress.  HENT:  Head: Normocephalic and atraumatic.  Eyes: Conjunctivae and EOM are normal. Pupils are equal, round, and reactive to light.  Neck: Normal range of motion.  Pulmonary/Chest: Effort normal. No respiratory distress.  Musculoskeletal: Normal range of motion. He exhibits no edema or tenderness.  Neurological: He is alert and oriented to person, place, and time.  Skin: Skin is warm and dry.  Thin, resolving  scabbing to right chest port site. No infection noted.   Psychiatric: Affect normal.  Nursing note and vitals reviewed.   LABORATORY DATA:. No visits with results within 3 Day(s) from this visit.  Latest known visit with results is:  Appointment on 11/11/2016  Component Date Value Ref Range Status  . WBC 11/11/2016 4.6  4.0 - 10.3 10e3/uL Final  . NEUT# 11/11/2016 3.0  1.5 - 6.5 10e3/uL Final  . HGB 11/11/2016 10.0* 13.0 - 17.1 g/dL Final  . HCT 11/11/2016 30.8* 38.4 - 49.9 % Final  . Platelets 11/11/2016 269  140 - 400 10e3/uL Final  . MCV 11/11/2016 82.6  79.3 - 98.0 fL Final  . MCH 11/11/2016 26.9* 27.2 - 33.4 pg Final  . MCHC 11/11/2016 32.6  32.0 - 36.0 g/dL Final  . RBC 11/11/2016 3.73* 4.20 - 5.82 10e6/uL Final  . RDW 11/11/2016 19.2* 11.0 - 14.6 % Final  . lymph# 11/11/2016 0.9  0.9 - 3.3 10e3/uL Final  . MONO# 11/11/2016 0.4  0.1 - 0.9 10e3/uL Final  . Eosinophils Absolute 11/11/2016 0.3  0.0 - 0.5 10e3/uL Final  . Basophils Absolute 11/11/2016 0.0  0.0 - 0.1 10e3/uL Final  . NEUT% 11/11/2016 65.4  39.0 - 75.0 % Final  . LYMPH% 11/11/2016 19.5  14.0 - 49.0 % Final  . MONO% 11/11/2016 9.0  0.0 - 14.0 % Final  . EOS% 11/11/2016 5.6  0.0 - 7.0 % Final  . BASO% 11/11/2016 0.5  0.0 - 2.0 % Final    RADIOGRAPHIC STUDIES: No results found.  ASSESSMENT/PLAN:    Dehydration Patient presented to the [facility] today to receive IV fluid rehydration.   Patient stated understanding of all instructions; and was in agreement with this plan of care. The patient knows to call the clinic with any problems, questions or concerns.   Total time spent with patient was 10 minutes;  with greater than 75 percent of that time spent in face to face counseling regarding patient's symptoms,  and coordination of care and follow up.  Disclaimer:This dictation was prepared with Dragon/digital dictation along with [employer]. Any transcriptional errors that result from this process  are unintentional.  Drue Second, NP 11/18/2016

## 2016-11-19 ENCOUNTER — Other Ambulatory Visit: Payer: Self-pay | Admitting: Hematology

## 2016-11-19 ENCOUNTER — Telehealth: Payer: Self-pay | Admitting: Hematology

## 2016-11-19 ENCOUNTER — Ambulatory Visit (HOSPITAL_BASED_OUTPATIENT_CLINIC_OR_DEPARTMENT_OTHER): Payer: Medicare Other | Admitting: Hematology

## 2016-11-19 ENCOUNTER — Encounter: Payer: Self-pay | Admitting: Hematology

## 2016-11-19 ENCOUNTER — Ambulatory Visit (HOSPITAL_BASED_OUTPATIENT_CLINIC_OR_DEPARTMENT_OTHER): Payer: Medicare Other

## 2016-11-19 ENCOUNTER — Ambulatory Visit: Payer: Medicare Other

## 2016-11-19 ENCOUNTER — Ambulatory Visit (HOSPITAL_COMMUNITY): Payer: Medicare Other

## 2016-11-19 ENCOUNTER — Other Ambulatory Visit (HOSPITAL_BASED_OUTPATIENT_CLINIC_OR_DEPARTMENT_OTHER): Payer: Medicare Other

## 2016-11-19 DIAGNOSIS — D6481 Anemia due to antineoplastic chemotherapy: Secondary | ICD-10-CM

## 2016-11-19 DIAGNOSIS — C7972 Secondary malignant neoplasm of left adrenal gland: Secondary | ICD-10-CM

## 2016-11-19 DIAGNOSIS — C187 Malignant neoplasm of sigmoid colon: Secondary | ICD-10-CM | POA: Diagnosis not present

## 2016-11-19 DIAGNOSIS — E119 Type 2 diabetes mellitus without complications: Secondary | ICD-10-CM

## 2016-11-19 DIAGNOSIS — I5042 Chronic combined systolic (congestive) and diastolic (congestive) heart failure: Secondary | ICD-10-CM

## 2016-11-19 DIAGNOSIS — D509 Iron deficiency anemia, unspecified: Secondary | ICD-10-CM

## 2016-11-19 DIAGNOSIS — C786 Secondary malignant neoplasm of retroperitoneum and peritoneum: Secondary | ICD-10-CM

## 2016-11-19 DIAGNOSIS — C7800 Secondary malignant neoplasm of unspecified lung: Secondary | ICD-10-CM

## 2016-11-19 DIAGNOSIS — Z5111 Encounter for antineoplastic chemotherapy: Secondary | ICD-10-CM | POA: Diagnosis not present

## 2016-11-19 DIAGNOSIS — C787 Secondary malignant neoplasm of liver and intrahepatic bile duct: Secondary | ICD-10-CM

## 2016-11-19 DIAGNOSIS — Z5112 Encounter for antineoplastic immunotherapy: Secondary | ICD-10-CM | POA: Diagnosis not present

## 2016-11-19 DIAGNOSIS — I10 Essential (primary) hypertension: Secondary | ICD-10-CM

## 2016-11-19 DIAGNOSIS — E46 Unspecified protein-calorie malnutrition: Secondary | ICD-10-CM

## 2016-11-19 DIAGNOSIS — Z95828 Presence of other vascular implants and grafts: Secondary | ICD-10-CM

## 2016-11-19 LAB — COMPREHENSIVE METABOLIC PANEL
ALT: 22 U/L (ref 0–55)
ANION GAP: 10 meq/L (ref 3–11)
AST: 33 U/L (ref 5–34)
Albumin: 3 g/dL — ABNORMAL LOW (ref 3.5–5.0)
Alkaline Phosphatase: 200 U/L — ABNORMAL HIGH (ref 40–150)
BUN: 9 mg/dL (ref 7.0–26.0)
CALCIUM: 7.8 mg/dL — AB (ref 8.4–10.4)
CHLORIDE: 106 meq/L (ref 98–109)
CO2: 23 meq/L (ref 22–29)
CREATININE: 0.8 mg/dL (ref 0.7–1.3)
Glucose: 291 mg/dl — ABNORMAL HIGH (ref 70–140)
POTASSIUM: 3.9 meq/L (ref 3.5–5.1)
Sodium: 138 mEq/L (ref 136–145)
Total Bilirubin: 0.87 mg/dL (ref 0.20–1.20)
Total Protein: 6.8 g/dL (ref 6.4–8.3)

## 2016-11-19 LAB — CBC WITH DIFFERENTIAL/PLATELET
BASO%: 0.5 % (ref 0.0–2.0)
BASOS ABS: 0 10*3/uL (ref 0.0–0.1)
EOS%: 3.9 % (ref 0.0–7.0)
Eosinophils Absolute: 0.3 10*3/uL (ref 0.0–0.5)
HEMATOCRIT: 32.5 % — AB (ref 38.4–49.9)
HGB: 10.6 g/dL — ABNORMAL LOW (ref 13.0–17.1)
LYMPH%: 11.5 % — AB (ref 14.0–49.0)
MCH: 27.4 pg (ref 27.2–33.4)
MCHC: 32.6 g/dL (ref 32.0–36.0)
MCV: 84 fL (ref 79.3–98.0)
MONO#: 0.7 10*3/uL (ref 0.1–0.9)
MONO%: 11.1 % (ref 0.0–14.0)
NEUT#: 4.6 10*3/uL (ref 1.5–6.5)
NEUT%: 73 % (ref 39.0–75.0)
PLATELETS: 174 10*3/uL (ref 140–400)
RBC: 3.87 10*6/uL — AB (ref 4.20–5.82)
RDW: 17 % — ABNORMAL HIGH (ref 11.0–14.6)
WBC: 6.3 10*3/uL (ref 4.0–10.3)
lymph#: 0.7 10*3/uL — ABNORMAL LOW (ref 0.9–3.3)
nRBC: 0 % (ref 0–0)

## 2016-11-19 LAB — MAGNESIUM: Magnesium: 0.6 mg/dl — CL (ref 1.5–2.5)

## 2016-11-19 MED ORDER — ATROPINE SULFATE 1 MG/ML IJ SOLN
INTRAMUSCULAR | Status: AC
  Filled 2016-11-19: qty 1

## 2016-11-19 MED ORDER — ONDANSETRON HCL 8 MG PO TABS
8.0000 mg | ORAL_TABLET | Freq: Once | ORAL | Status: AC
  Administered 2016-11-19: 8 mg via ORAL

## 2016-11-19 MED ORDER — SODIUM CHLORIDE 0.9 % IV SOLN
Freq: Once | INTRAVENOUS | Status: AC
  Administered 2016-11-19: 10:00:00 via INTRAVENOUS

## 2016-11-19 MED ORDER — ONDANSETRON HCL 8 MG PO TABS
ORAL_TABLET | ORAL | Status: AC
  Filled 2016-11-19: qty 1

## 2016-11-19 MED ORDER — ATROPINE SULFATE 1 MG/ML IJ SOLN
0.5000 mg | Freq: Once | INTRAMUSCULAR | Status: AC | PRN
  Administered 2016-11-19: 0.5 mg via INTRAVENOUS

## 2016-11-19 MED ORDER — DEXAMETHASONE SODIUM PHOSPHATE 10 MG/ML IJ SOLN
INTRAMUSCULAR | Status: AC
  Filled 2016-11-19: qty 1

## 2016-11-19 MED ORDER — DEXAMETHASONE SODIUM PHOSPHATE 10 MG/ML IJ SOLN
10.0000 mg | Freq: Once | INTRAMUSCULAR | Status: AC
  Administered 2016-11-19: 10 mg via INTRAVENOUS

## 2016-11-19 MED ORDER — SODIUM CHLORIDE 0.9 % IV SOLN
140.0000 mg/m2 | Freq: Once | INTRAVENOUS | Status: AC
  Administered 2016-11-19: 240 mg via INTRAVENOUS
  Filled 2016-11-19: qty 10

## 2016-11-19 MED ORDER — SODIUM CHLORIDE 0.9 % IV SOLN
1800.0000 mg/m2 | INTRAVENOUS | Status: DC
  Administered 2016-11-19: 3050 mg via INTRAVENOUS
  Filled 2016-11-19: qty 61

## 2016-11-19 MED ORDER — PANITUMUMAB CHEMO INJECTION 100 MG/5ML
6.1000 mg/kg | Freq: Once | INTRAVENOUS | Status: AC
  Administered 2016-11-19: 400 mg via INTRAVENOUS
  Filled 2016-11-19: qty 20

## 2016-11-19 NOTE — Telephone Encounter (Signed)
R/s pt MD appt to 1/23 and pump disconnect to 1/24 per urgent sch msg 1/23

## 2016-11-19 NOTE — Patient Instructions (Signed)
[facility] Discharge Instructions for Patients Receiving Chemotherapy  Today you received the following chemotherapy agents:  Vectibix, Irinotecan, and Adrucil.  To help prevent nausea and vomiting after your treatment, we encourage you to take your nausea medication as prescribed.   If you develop nausea and vomiting that is not controlled by your nausea medication, call the clinic.   BELOW ARE SYMPTOMS THAT SHOULD BE REPORTED IMMEDIATELY:  *FEVER GREATER THAN 100.5 F  *CHILLS WITH OR WITHOUT FEVER  NAUSEA AND VOMITING THAT IS NOT CONTROLLED WITH YOUR NAUSEA MEDICATION  *UNUSUAL SHORTNESS OF BREATH  *UNUSUAL BRUISING OR BLEEDING  TENDERNESS IN MOUTH AND THROAT WITH OR WITHOUT PRESENCE OF ULCERS  *URINARY PROBLEMS  *BOWEL PROBLEMS  UNUSUAL RASH Items with * indicate a potential emergency and should be followed up as soon as possible.  Feel free to call the clinic you have any questions or concerns. The clinic phone number is [phone].  Please show the [facility] at check-in to the Emergency Department and triage nurse.

## 2016-11-19 NOTE — Progress Notes (Signed)
Labs reviewed with [name]. OK to tx with Mg of 0.61

## 2016-11-19 NOTE — Telephone Encounter (Signed)
Patient came to scheduling. No LOS available to schedule further appointments during time of scheduling.

## 2016-11-19 NOTE — Progress Notes (Signed)
[place]  [phone] [phone]  Clinic follow up Note   Patient Care Team: [name], MD as PCP - General (Specialist) [name], CRNP as Nurse Practitioner (Nurse Practitioner) Truitt Merle, MD as Consulting Physician (Hematology) Ladene Artist, MD as Consulting Physician (Gastroenterology) 11/19/2016  CHIEF COMPLAINTS Follow up metastatic sigmoid colon cancer  Oncology History   He is a Metastatic colon cancer to liver   Staging form: Colon and Rectum, AJCC 7th Edition     Clinical: Stage Unknown ([place], NX, M1) - Unsigned      Cancer of sigmoid colon ([place])   10/28/2014 Tumor Marker    AFP 3.2 CEA > 10,000 CA 19-9 12,929.6. tumor (-) KRAS and NRAS mutation.       11/10/2014 Imaging    PET scan showed hypermetabolic mass in the sigmoid colon was noted metastasis in the retroperitoneum. Probable left adrenal and pulmonary metastasis, and diffuse liver metastasis.      11/21/2014 Initial Diagnosis    Metastatic colon cancer to liver, lung, abd nodes and left adrenal gland. Diagnosis was made by liver biopsy.       11/30/2014 - 05/23/2015 Chemotherapy    First line chemo [id number], Panitumumab added from second cycle, chemo held due to recurrent GI bleeding        12/28/2014 - 12/31/2014 Hospital Admission    Was admitted for dehydration, neutropenia fever with UTI, and severe skin rash.      02/08/2015 - 02/12/2015 Hospital Admission    He was admitted for upper GI bleeding, e.g. showed a gastric ulcer with clots, status post appendectomy injection. He also received a blood transfusion.      03/01/2015 Tumor Marker    CEA 694, CA19.9 462      03/13/2015 Imaging    CT CAP showed partial response, no new lesions.       05/31/2015 - 06/02/2015 Hospital Admission    He was admitted to [facility] in [place] due to upper GI bleeding, EGD showed gastric and dudenal ulcers       06/27/2015 - 06/29/2015 Hospital Admission    he was admitted  for dairrhea and pancolitis, c-diff and stool cultures were negative, treated with antibiotics      07/12/2015 -  Chemotherapy    panitumumab 54m/kg, every 2 weeks      09/19/2015 -  Chemotherapy    Irinotecan 1859mm2 every 2 weeks, dose reduction due to diarrhea. 5-Fu added on 06/10/2016 due to slight disease progression       11/08/2015 - 11/10/2015 Hospital Admission    Pt was admitted for sepsis and hypotension, was found to have (+) influenza A and treated.       11/30/2015 Imaging     CT scans reviewed continued improvement in the liver metastasis, no other new lesions.      05/06/2016 Tumor Marker    CEA has gradually increased from 110 in March 2017 to 566 in July 2070      06/04/2016 Imaging    Restaging CT scan showed similar to mild increase in hepatic metastasis, mild enlargement of right adrenal nodule, primary similar pulmonary nodules. A left upper lobe nodule has resolved.      11/12/2016 Imaging    CT CAP - Restaging IMPRESSION: 1. No significant change in the appearance of multifocal pulmonary nodules. 2. Previously referenced partially calcified liver metastases are stable from previous exam. Within the posterior right lobe of liver there is a metastasis which appears  slightly increased in size from previous exam. 3. Mild decrease in size of right adrenal nodule. 4. There is mild wall thickening and inflammation involving the descending colon and proximal sigmoid colon worrisome for colitis. A small intramural fluid collection is identified which is unchanged from 08/19/2016, significance unknown. 5. Aortic atherosclerosis.        HISTORY OF PRESENTING ILLNESS:  Jason Reilly 71 y.o. male is here because of abnormal CT findings, which is very suspicious for malignancy. He is on ranitidine from [place], has been on in the [place] for 16 years. He came in with his son and an interpreter.  He has been feeling fatigued since two month ago. He is still able to do all  ADLs. He otherwise denies any pain, bloating or nausea.  He lost about 20lbs in 3 month. His appetite is lower than before, eats less, no change of his bowl habits.  She denied any hematochezia or melana. Per his son, he has had some personality changes daily, irritable, slightly confused some time.  He was evaluated by his primary care physician. Lab test reviewed hepatitis B infection, which he did not know before, and elevated alkaline phosphatase, his liver function and the rest of the liver function was not remarkable. [place] of abdomen was obtained on 07/22/2014, which showed diffusely abnormal liver with multiple echogenic lesions. CT of abdomen with and without contrast was done on 08/26/2014, which reviewed here at a medically with multiple large partially calcified hepatic masses consistent with metastatic disease. Mild retroperitoneal adenopathy with the largest node measuring 1.6 cm. And nonspecific 1.4 cm left adrenal nodule was also noticed. His tumor marker showed CEA greater than 10,000, CA 19-9 12,929, AFP 3.2 (normal). He was referred to [facility] [place] [facility] and was evaluated by nurse practitioner [name]. Treatment for hepatitis B was not recommended based on his virus load.  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 He was evaluated by a cardiologist in 2014. He denies any significant dyspnea on exertion. No leg swollen.  CURRENT THERAPY:  1. panitumumab 82m/kg, every 2 weeks, started on 07/12/2015 2. Irinotecan 1886mm2 every 2 weeks added on 09/19/2015, dose decreased to 16045m2 from cycle 2, and further decreased to 125m60m from cycle 10 due to diarrhea and anorexia, changed to 140mg77mfrom cycle 13. 3. 5-FU 1800mg/43mver 46 hours added back from 06/10/2016 due to mild disease progression 4. He also receives IV magnesium 6g weekly   INTERIM HISTORY: Irving rBrandins for follow-up and chemotherapy. He is accompanied by  his son to the clinic today. He is doing moderately well overall, his son reports he has been more fatigued lately. He denies any significant pain, nausea, diarrhea, or other symptoms. He has good appetite and eats well. His weight is stable. He functions pretty well at home. The  MEDICAL HISTORY:  Past Medical History:  Diagnosis Date  . Abnormal EKG   . Diabetes mellitus without complication (HCC)    metformin  . Gastric ulcer   . H. pylori infection   . Hepatitis B   . Hypertension   . Metastatic colon cancer to liver (HCC)  [place]-ischemic cardiomyopathy (HCC)  [place]     SURGICAL HISTORY: Past Surgical History:  Procedure Laterality Date  . ESOPHAGOGASTRODUODENOSCOPY N/A 02/08/2015   Procedure: ESOPHAGOGASTRODUODENOSCOPY (EGD);  Surgeon: MalcolLadene Artist [facility];  Service: Endoscopy;  Laterality: N/A;  . ESOPHAGOGASTRODUODENOSCOPY (EGD) WITH PROPOFOL N/A 08/15/2015  Procedure: ESOPHAGOGASTRODUODENOSCOPY (EGD) WITH PROPOFOL;  Surgeon: Ladene Artist, MD;  Location: [facility];  Service: Endoscopy;  Laterality: N/A;  . LEFT AND RIGHT HEART CATHETERIZATION WITH CORONARY ANGIOGRAM N/A 03/29/2013   Procedure: LEFT AND RIGHT HEART CATHETERIZATION WITH CORONARY ANGIOGRAM;  Surgeon: Pixie Casino, MD;  Location: [facility] CATH LAB;  Service: Cardiovascular;  Laterality: N/A;  . Nuclear Stress Test  03/03/2013   High risk - consistent with nonischemic cardiomyopathy    SOCIAL HISTORY: Social History   Social History  . Marital status: Married    Spouse name: N/A  . Number of children: N/A  . Years of education: N/A   Occupational History  . Not on file.   Social History Main Topics  . Smoking status: Former Smoker    Types: Cigarettes    Quit date: 11/07/2008  . Smokeless tobacco: Never Used  . Alcohol use Yes     Comment: occasional  . Drug use: No  . Sexual activity: No   Other Topics Concern  . Not on file   Social History Narrative    Married   Enjoys walking   Has lived in [place] > 16 years    FAMILY HISTORY: No family history of liver disease or malignancy.  ALLERGIES:  has No Known Allergies.  MEDICATIONS:  Current Outpatient Prescriptions on File Prior to Visit  Medication Sig Dispense Refill  . clindamycin (CLINDAGEL) 1 % gel Apply topically 2 (two) times daily. 30 g 2  . diphenoxylate-atropine (LOMOTIL) 2.5-0.025 MG tablet Take 1-2 tablets by mouth 4 (four) times daily as needed for diarrhea or loose stools. 30 tablet 0  . folic acid (FOLVITE) 1 MG tablet Take 1 tablet (1 mg total) by mouth daily. 30 tablet 3  . hydrocortisone 1 % ointment Apply 1 application topically 2 (two) times daily. 56 g 0  . lidocaine-prilocaine (EMLA) cream Apply 1 application topically as needed. Apply to [facility] cath at least one hour before needle stick. 30 g 2  . lisinopril-hydrochlorothiazide (PRINZIDE,ZESTORETIC) 10-12.5 MG tablet     . loperamide (IMODIUM) 2 MG capsule Take 2 capsules (4 mg total) by mouth 4 (four) times daily as needed for diarrhea or loose stools (NO MORE THAN 8 TABLETS PER DAY.). 60 capsule 3  . magic mouthwash SOLN Take 5 mLs by mouth 4 (four) times daily. 120 mL 1  . magnesium oxide (MAG-OX) 400 (241.3 Mg) MG tablet Take 1 tablet (400 mg total) by mouth 3 (three) times daily. 90 tablet 3  . metFORMIN (GLUCOPHAGE) 500 MG tablet Take 1 tablet (500 mg total) by mouth 2 (two) times daily with a meal. 60 tablet 0  . nadolol (CORGARD) 20 MG tablet Take 1 tablet (20 mg total) by mouth 2 (two) times daily. THIS IS A ONE TIME ORDER.  FUTURE REFILLS NEED TO BE DONE BY PRIMARY MD. 60 tablet 1  . ondansetron (ZOFRAN) 8 MG tablet Take 1 tablet (8 mg total) by mouth every 8 (eight) hours as needed for nausea or vomiting. 45 tablet 2  . pantoprazole (PROTONIX) 40 MG tablet Take 1 tablet (40 mg total) by mouth daily. 30 tablet 2  . prochlorperazine (COMPAZINE) 10 MG tablet Take 1 tablet (10 mg total) by mouth every 6 (six) hours as  needed for nausea or vomiting. 30 tablet 2   Current Facility-Administered Medications on File Prior to Visit  Medication Dose Route Frequency Provider Last Rate Last Dose  . 0.9 %  sodium chloride infusion   Intravenous  Once Truitt Merle, MD      . ferumoxytol [facility]) 510 mg in sodium chloride 0.9 % 100 mL IVPB  510 mg Intravenous Once Truitt Merle, MD      . fluorouracil (ADRUCIL) 3,050 mg in sodium chloride 0.9 % 89 mL chemo infusion  1,800 mg/m2 (Treatment Plan Recorded) Intravenous Q14 Days Truitt Merle, MD   3,050 mg at 11/19/16 1440  . sodium chloride 0.9 % injection 10 mL  10 mL Intracatheter PRN Truitt Merle, MD   10 mL at 10/03/15 1705  . sodium chloride 0.9 % injection 10 mL  10 mL Intravenous PRN Truitt Merle, MD   10 mL at 05/14/16 1127  ;   REVIEW OF SYSTEMS:   Constitutional: Denies fevers, chills or abnormal night sweats, (+) fatigue  Eyes: Denies blurriness of vision, double vision or watery eyes Ears, nose, mouth, throat, and face: Denies mucositis or sore throat Respiratory: Denies cough, dyspnea or wheezes Cardiovascular: Denies palpitation, chest discomfort or lower extremity swelling Gastrointestinal: Denies nausea, heartburn or change in bowel habits Skin: Denies abnormal skin rashes Lymphatics: Denies new lymphadenopathy or easy bruising Neurological:Denies numbness, tingling or new weaknesses Behavioral/Psych: Mood is stable, no new changes, (+) insomnia All other systems were reviewed with the patient and are negative.  PHYSICAL EXAMINATION: Blood pressure 109/63, heart rate 86, respiratory rate 18, temperature 99.3, pulse ox 100% on room air ECOG PERFORMANCE STATUS: 1 GENERAL:alert, no distress and comfortable SKIN: (+) Dry skin,  no skin ulcer, (+) scatter skin rashes on his neck and upper chest, some are resolving with skin pigmentation. No discharge. EYES: normal, conjunctiva are pink and non-injected, sclera clear OROPHARYNX:no exudate, no erythema and lips, buccal  mucosa, and tongue with mild discoloration at the tip. The low front teeth are missing  NECK: supple, thyroid normal size, non-tender, without nodularity LYMPH:  no palpable lymphadenopathy in the cervical, axillary or inguinal LUNGS: clear to auscultation and percussion with normal breathing effort, no rales   HEART: regular rate & rhythm and no murmurs and no lower extremity edema ABDOMEN:abdomen soft, non-tender, no hepatomegaly, no splenomegaly and normal bowel sounds Musculoskeletal:no cyanosis of digits and no clubbing  PSYCH: alert & oriented x 3 with fluent speech NEURO: no focal motor/sensory deficits   LABORATORY DATA:  I have reviewed the data as listed CBC Latest Ref Rng & Units 11/19/2016 11/11/2016 11/06/2016  WBC 4.0 - 10.3 10e3/uL 6.3 4.6 3.2(L)  Hemoglobin 13.0 - 17.1 g/dL 10.6(L) 10.0(L) 7.6(L)  Hematocrit 38.4 - 49.9 % 32.5(L) 30.8(L) 23.7(L)  Platelets 140 - 400 10e3/uL 174 269 135(L)    CMP Latest Ref Rng & Units 11/19/2016 11/06/2016 10/22/2016  Glucose 70 - 140 mg/dl 291(H) 170(H) 173(H)  BUN 7.0 - 26.0 mg/dL 9.0 8.5 13.4  Creatinine 0.7 - 1.3 mg/dL 0.8 0.7 0.8  Sodium 136 - 145 mEq/L 138 142 141  Potassium 3.5 - 5.1 mEq/L 3.9 3.5 4.8  Chloride 101 - 111 mmol/L - - -  CO2 22 - 29 mEq/L '23 24 25  ' Calcium 8.4 - 10.4 mg/dL 7.8(L) 7.6(L) 8.5  Total Protein 6.4 - 8.3 g/dL 6.8 6.7 7.7  Total Bilirubin 0.20 - 1.20 mg/dL 0.87 0.47 0.46  Alkaline Phos 40 - 150 U/L 200(H) 198(H) 222(H)  AST 5 - 34 U/L 33 24 30  ALT 0 - 55 U/L '22 20 23    ' INITIAL tumor markers AFP 3.2 CEA > 10,000 CA 19-9 12,929.6  CEA 01/11/2015: 1809 07/05/2015: 386 10/03/2015: 66.4 02/19/2016: 155.8 05/06/2016:  566 07/08/2016: 834 08/05/2016: 1102 10/07/2016: 1733 11/06/2016: 2,214.95  Pathology report  Liver, needle/core biopsy - METASTATIC ADENOCARCINOMA, SEE COMMENT. Microscopic Comment The adenocarcinoma demonstrates the following immunophenotype: Cytokeratin 7 - negative  expression. Cytokeratin 20 - strong diffuse expression. CD2 - strong diffuse expression. Overall the morphology and immunophenotype are that of metastatic adenocarcinoma primary to colorectum. The recent nuclear medicine scan demonstrating sigmoid mass with associated liver masses is noted.  [facility] test result:    RADIOGRAPHIC STUDIES: I have personally reviewed the outside CT scan image with patient and his son.   CT chest, abdomen and pelvis with IV contrast on  08/19/2016  IMPRESSION: 1. Respiratory motion degrades image quality in the lungs. Favor slight enlargement of a right lower lobe pulmonary nodule as well as development of a new pulmonary nodule in the left upper lobe. 2. Hepatic metastatic disease is grossly stable. Associated marginal irregularity of the liver is likely due to hepatic fibrosis from treated metastatic disease, with evidence of portal hypertension. 3. Right adrenal nodule is stable to minimally enlarged. 4. Aortic atherosclerosis (ICD10-170.0). Coronary artery Calcification.  CT CAP 11/12/2016 IMPRESSION: 1. No significant change in the appearance of multifocal pulmonary nodules. 2. Previously referenced partially calcified liver metastases are stable from previous exam. Within the posterior right lobe of liver there is a metastasis which appears slightly increased in size from previous exam. 3. Mild decrease in size of right adrenal nodule. 4. There is mild wall thickening and inflammation involving the descending colon and proximal sigmoid colon worrisome for colitis. A small intramural fluid collection is identified which is unchanged from 08/19/2016, significance unknown. 5. Aortic atherosclerosis.  ASSESSMENT & PLAN:  71 y.o. [place] male, with past history of hypertension and dilated nonischemic gammopathy with EF 25%, no clinical signs of heart failure, who was found to have hepatitis B infection lately, and multiple liver lesions on  the CT scan. He has extremely high CEA and CA 19-9 levels. PET scan reviewed a hypermetabolic sigmoid colon mass, diffuse liver metastasis, probable lung and adrenal gland metastasis.  1. Metastatic sigmoid colon cancer, with diffuse liver, lungs, node and left adrenal gland metastases. KRAS/NRAS wild type, MSI-stable -Liver biopsy showed metastatic adenocarcinoma. His tumor were strongly positive for CK20 and CD2, consistent with primary colorectal primary. KRAS and NRAS mutations were not detected.  -Pt understands that this is incurable cancer, and he has very high disease burden and overall prognosis is poor. The treatment goal is palliative -I previously discussed his restaging CT from 08/19/2016 which showed overall stable disease in liver, a few small lung nodules, slightly bigger, will continue monitoring. - his previous scan showed overall some improvement since we restarted on 5-FU in August 2017. -His tumor marker CEA has been trending up, concerning for slow disease progression, we'll continue monitoring -I reviewed his restaging CT scan from last week, which showed overall stable disease, except one lesion in the liver got slightly bigger. He is clinically doing well, I'll continue his current chemotherapy -I'll repeat his staging scan in 2 months, for close follow-up. He likely needs change is chemotherapy treatment in the near future. -Lab reviewed, adequate for treatment, we'll proceed to chemotherapy today   2. Grade 1-2 skin rashes, stable  -Secondary to panitumumab, stable overall  -continue hydrocortisone 2.5%, and clindamycin gel 1%  twice daily as needed  -He knows to avoid sun exposure, and call me if it gets worse.  3. Gastric ulcer with significant GI bleeding in April and Aug 2016 -He  is on PPI, continue once daily  -Repeat his EGD on 08/15/2015 showed near complete healing of his gastric ulcer -continue Nadolol 22m bid, per Dr. SFuller Plan- I encourage him to follow-up  [facility] has restarted 5-fu   4. Type 2 DM  -His glucose level has increased lately, I encouraged him to watch his diet, avoid any sweets, and monitor closely at home -I encourage him to follow-up with his primary care physician Dr. WJimmye [place]  5. HTN, Dilated nonischemic ischemia cardiomyopathy with EF 25% -He is clinically doing well without symptoms of CHF. However this is probably going to impact his chemotherapy.Will try to avoid cardiotoxic chemotherapy agent and avoid fluid overload during chemotherapy. -Continue follow-up with cardiology. -will hold on his lisinopril-HCTZ for now, his BP has been normal lately   6 Hepatitis B carrier, with mild portal hypertension  -Per liver clinic, no need for treatment. Follow-up with Dr. SSilvio Pate  7. Malnutrition -I encouraged him to eat more, and take supplements as needed. -improved, gained weight back after I reduce his chemotherapy dose  -follow up with Dietitian   8. Anemia secondary to GI bleeding, iron deficiency and chemo  -Repeat lab on 06/06/2015 showed ferritin 117, serum iron 24, saturation 8%, which supports iron deficiency -He received IV Feraheme again in Aug 2016 after GI bleeding  -Repeat a ferritin was 273 on 10/03/2015, much improved  -he received iv feraheme again on 11/27/2015 and 12/04/2015, but anemia did not improve much. -His anemia has been slightly worse lately, probably related to chemotherapy, we'll close monitor any signs of GI bleeding. -Blood transfusion if hemoglobin less than 8 or symptomatic anemia with hemoglobin 8-9  9. hypomagnesemia  -secondary to panitumumab -he receives IV mag 6 g every week  -He is taking magnesium pill 2 tablets 3 times a day -continue monitoring    Plan -Lab reviewed, adequate for treatment, we'll proceed FOLFIRI and panitumumab today and continue every 2 weeks.  -magnesium infusion weekly   -I'll see him back in 2 weeks.   All  questions were answered. The patient knows to call the clinic with any problems, questions or concerns.  I spent 25 minutes counseling the patient face to face. The total time spent in the appointment was 30 minutes and more than 50% was on counseling.  I have reviewed the above documentation for accuracy and completeness, and I agree with the above information.     FTruitt Merle MD 11/19/16

## 2016-11-19 NOTE — Progress Notes (Signed)
Patient arrived to writer's section for Labs and [employer]. Chemo scheduled for 11/20/16. Son said that Chemo was to be administered today. Writer spoke with [name] and if Labs stable Tx was to be administered today. Labs OK. Pharmacy consulted. Patient stated in same chair for tx.

## 2016-11-20 ENCOUNTER — Ambulatory Visit: Payer: Medicare Other | Admitting: Hematology

## 2016-11-20 ENCOUNTER — Other Ambulatory Visit: Payer: Medicare Other

## 2016-11-20 ENCOUNTER — Ambulatory Visit: Payer: Medicare Other

## 2016-11-21 ENCOUNTER — Ambulatory Visit (HOSPITAL_BASED_OUTPATIENT_CLINIC_OR_DEPARTMENT_OTHER): Payer: Medicare Other

## 2016-11-21 ENCOUNTER — Telehealth: Payer: Self-pay | Admitting: Hematology

## 2016-11-21 ENCOUNTER — Encounter (INDEPENDENT_AMBULATORY_CARE_PROVIDER_SITE_OTHER): Payer: Self-pay

## 2016-11-21 VITALS — BP 134/69 | HR 61 | Temp 98.1°F | Resp 18

## 2016-11-21 DIAGNOSIS — C187 Malignant neoplasm of sigmoid colon: Secondary | ICD-10-CM

## 2016-11-21 DIAGNOSIS — D509 Iron deficiency anemia, unspecified: Secondary | ICD-10-CM

## 2016-11-21 MED ORDER — SODIUM CHLORIDE 0.9 % IJ SOLN
10.0000 mL | INTRAMUSCULAR | Status: DC | PRN
  Administered 2016-11-21: 10 mL
  Filled 2016-11-21: qty 10

## 2016-11-21 MED ORDER — SODIUM CHLORIDE 0.9 % IV SOLN
Freq: Once | INTRAVENOUS | Status: AC
  Administered 2016-11-21: 13:00:00 via INTRAVENOUS
  Filled 2016-11-21: qty 250

## 2016-11-21 MED ORDER — HEPARIN SOD (PORK) LOCK FLUSH 100 UNIT/ML IV SOLN
500.0000 [IU] | Freq: Once | INTRAVENOUS | Status: AC | PRN
  Administered 2016-11-21: 500 [IU]
  Filled 2016-11-21: qty 5

## 2016-11-21 NOTE — Telephone Encounter (Signed)
Gave patient son appointment schedule for January and February. Appointments scheduled per 1/23 los.

## 2016-11-21 NOTE — Progress Notes (Signed)
11/21/2016 Patient portacath site red and irritated.  Family says he complained of itching.  Advised to continue to watch and if redness persists and if he develops a fever or has any drainage from portacath they need to seek medical attention immediately.  Daughter Jason Reilly (who speaks [place]) confirms understanding.

## 2016-11-21 NOTE — Patient Instructions (Signed)
Hypomagnesemia Introduction Hypomagnesemia is a condition in which the level of magnesium in the blood is low. Magnesium is a mineral that is found in many foods. It is used in many different processes in the body. Hypomagnesemia can affect every organ in the body. It can cause life-threatening problems. What are the causes? Causes of hypomagnesemia include:  Not getting enough magnesium in your diet.  Malnutrition.  Problems with absorbing magnesium from the intestines.  Dehydration.  Alcohol abuse.  Vomiting.  Severe diarrhea.  Some medicines, including medicines that make you urinate more.  Certain diseases, such as kidney disease, diabetes, and overactive thyroid. What are the signs or symptoms?  Involuntary shaking or trembling of a body part (tremor).  Confusion.  Muscle weakness.  Sensitivity to light, sound, and touch.  Psychiatric issues, such as depression, irritability, or psychosis.  Sudden tightening of muscles (muscle spasms).  Tingling in the arms and legs.  A feeling of fluttering of the heart. These symptoms are more severe if magnesium levels drop suddenly. How is this diagnosed? To make a diagnosis, your health care provider will do a physical exam and order blood and urine tests. How is this treated? Treatment will depend on the cause and the severity of your condition. It may involve:  A magnesium supplement. This can be taken in pill form. It can also be given through an IV tube. This is usually done if the condition is severe.  Changes to your diet. You may be directed to eat foods that have a lot of magnesium, such as green leafy vegetables, peas, beans, and nuts.  Eliminating alcohol from your diet. Follow these instructions at home:  Include foods with magnesium in your diet. Foods that are rich in magnesium include green vegetables, beans, nuts and seeds, and whole grains.  Take medicines only as directed by your health care  provider.  Take magnesium supplements if your health care provider instructs you to do that. Take them as directed.  Have your magnesium levels monitored as directed by your health care provider.  When you are active, drink fluids that contain electrolytes.  Keep all follow-up visits as directed by your health care provider. This is important. Contact a health care provider if:  You get worse instead of better.  Your symptoms return. Get help right away if:  Your symptoms are severe. This information is not intended to replace advice given to you by your health care provider. Make sure you discuss any questions you have with your health care provider. Document Released: 07/10/2005 Document Revised: 03/21/2016 Document Reviewed: 05/30/2014  2017 Elsevier

## 2016-11-22 ENCOUNTER — Ambulatory Visit: Payer: Medicare Other

## 2016-11-27 ENCOUNTER — Ambulatory Visit (HOSPITAL_BASED_OUTPATIENT_CLINIC_OR_DEPARTMENT_OTHER): Payer: Medicare Other

## 2016-11-27 VITALS — BP 119/73 | HR 66 | Temp 98.2°F | Resp 18

## 2016-11-27 DIAGNOSIS — D509 Iron deficiency anemia, unspecified: Secondary | ICD-10-CM

## 2016-11-27 DIAGNOSIS — Z95828 Presence of other vascular implants and grafts: Secondary | ICD-10-CM

## 2016-11-27 MED ORDER — SODIUM CHLORIDE 0.9 % IV SOLN
6.0000 g | Freq: Once | INTRAVENOUS | Status: DC
Start: 2016-11-27 — End: 2016-11-27

## 2016-11-27 MED ORDER — SODIUM CHLORIDE 0.9 % IV SOLN
Freq: Once | INTRAVENOUS | Status: AC
  Administered 2016-11-27: 14:00:00 via INTRAVENOUS
  Filled 2016-11-27: qty 250

## 2016-11-27 MED ORDER — HEPARIN SOD (PORK) LOCK FLUSH 100 UNIT/ML IV SOLN
500.0000 [IU] | Freq: Once | INTRAVENOUS | Status: AC | PRN
  Administered 2016-11-27: 500 [IU] via INTRAVENOUS
  Filled 2016-11-27: qty 5

## 2016-11-27 MED ORDER — SODIUM CHLORIDE 0.9 % IJ SOLN
10.0000 mL | INTRAMUSCULAR | Status: DC | PRN
  Administered 2016-11-27: 10 mL via INTRAVENOUS
  Filled 2016-11-27: qty 10

## 2016-11-27 NOTE — Patient Instructions (Signed)
Hypomagnesemia Introduction Hypomagnesemia is a condition in which the level of magnesium in the blood is low. Magnesium is a mineral that is found in many foods. It is used in many different processes in the body. Hypomagnesemia can affect every organ in the body. It can cause life-threatening problems. What are the causes? Causes of hypomagnesemia include:  Not getting enough magnesium in your diet.  Malnutrition.  Problems with absorbing magnesium from the intestines.  Dehydration.  Alcohol abuse.  Vomiting.  Severe diarrhea.  Some medicines, including medicines that make you urinate more.  Certain diseases, such as kidney disease, diabetes, and overactive thyroid. What are the signs or symptoms?  Involuntary shaking or trembling of a body part (tremor).  Confusion.  Muscle weakness.  Sensitivity to light, sound, and touch.  Psychiatric issues, such as depression, irritability, or psychosis.  Sudden tightening of muscles (muscle spasms).  Tingling in the arms and legs.  A feeling of fluttering of the heart. These symptoms are more severe if magnesium levels drop suddenly. How is this diagnosed? To make a diagnosis, your health care provider will do a physical exam and order blood and urine tests. How is this treated? Treatment will depend on the cause and the severity of your condition. It may involve:  A magnesium supplement. This can be taken in pill form. It can also be given through an IV tube. This is usually done if the condition is severe.  Changes to your diet. You may be directed to eat foods that have a lot of magnesium, such as green leafy vegetables, peas, beans, and nuts.  Eliminating alcohol from your diet. Follow these instructions at home:  Include foods with magnesium in your diet. Foods that are rich in magnesium include green vegetables, beans, nuts and seeds, and whole grains.  Take medicines only as directed by your health care  provider.  Take magnesium supplements if your health care provider instructs you to do that. Take them as directed.  Have your magnesium levels monitored as directed by your health care provider.  When you are active, drink fluids that contain electrolytes.  Keep all follow-up visits as directed by your health care provider. This is important. Contact a health care provider if:  You get worse instead of better.  Your symptoms return. Get help right away if:  Your symptoms are severe. This information is not intended to replace advice given to you by your health care provider. Make sure you discuss any questions you have with your health care provider. Document Released: 07/10/2005 Document Revised: 03/21/2016 Document Reviewed: 05/30/2014  2017 Elsevier

## 2016-11-29 NOTE — Progress Notes (Signed)
[place]  [phone] [phone]  Clinic follow up Note   Patient Care Team: Jason Junior, MD as PCP - General (Specialist) Jason Reilly, CRNP as Nurse Practitioner (Nurse Practitioner) Jason Merle, MD as Consulting Physician (Hematology) Jason Artist, MD as Consulting Physician (Gastroenterology) 12/02/2016  CHIEF COMPLAINTS Follow up metastatic sigmoid colon cancer  Oncology History   He is a Metastatic colon cancer to liver   Staging form: Colon and Rectum, AJCC 7th Edition     Clinical: Stage Unknown ([place], NX, M1) - Unsigned      Cancer of sigmoid colon ([place])   10/28/2014 Tumor Marker    AFP 3.2 CEA > 10,000 CA 19-9 12,929.6. tumor (-) KRAS and NRAS mutation.       11/10/2014 Imaging    PET scan showed hypermetabolic mass in the sigmoid colon was noted metastasis in the retroperitoneum. Probable left adrenal and pulmonary metastasis, and diffuse liver metastasis.      11/21/2014 Initial Diagnosis    Metastatic colon cancer to liver, lung, abd nodes and left adrenal gland. Diagnosis was made by liver biopsy.       11/30/2014 - 05/23/2015 Chemotherapy    First line chemo [id number], Panitumumab added from second cycle, chemo held due to recurrent GI bleeding        12/28/2014 - 12/31/2014 Hospital Admission    Was admitted for dehydration, neutropenia fever with UTI, and severe skin rash.      02/08/2015 - 02/12/2015 Hospital Admission    He was admitted for upper GI bleeding, e.g. showed a gastric ulcer with clots, status post appendectomy injection. He also received a blood transfusion.      03/01/2015 Tumor Marker    CEA 694, CA19.9 462      03/13/2015 Imaging    CT CAP showed partial response, no new lesions.       05/31/2015 - 06/02/2015 Hospital Admission    He was admitted to [facility] in [place] due to upper GI bleeding, EGD showed gastric and dudenal ulcers       06/27/2015 - 06/29/2015 Hospital Admission    he was admitted  for dairrhea and pancolitis, c-diff and stool cultures were negative, treated with antibiotics      07/12/2015 -  Chemotherapy    panitumumab 3m/kg, every 2 weeks      09/19/2015 -  Chemotherapy    Irinotecan 181mm2 every 2 weeks, dose reduction due to diarrhea. 5-Fu added on 06/10/2016 due to slight disease progression       11/08/2015 - 11/10/2015 Hospital Admission    Pt was admitted for sepsis and hypotension, was found to have (+) influenza A and treated.       11/30/2015 Imaging     CT scans reviewed continued improvement in the liver metastasis, no other new lesions.      05/06/2016 Tumor Marker    CEA has gradually increased from 110 in March 2017 to 566 in July 2070      06/04/2016 Imaging    Restaging CT scan showed similar to mild increase in hepatic metastasis, mild enlargement of right adrenal nodule, primary similar pulmonary nodules. A left upper lobe nodule has resolved.      11/12/2016 Imaging    CT CAP - Restaging IMPRESSION: 1. No significant change in the appearance of multifocal pulmonary nodules. 2. Previously referenced partially calcified liver metastases are stable from previous exam. Within the posterior right lobe of liver there is a metastasis which appears  slightly increased in size from previous exam. 3. Mild decrease in size of right adrenal nodule. 4. There is mild wall thickening and inflammation involving the descending colon and proximal sigmoid colon worrisome for colitis. A small intramural fluid collection is identified which is unchanged from 08/19/2016, significance unknown. 5. Aortic atherosclerosis.        HISTORY OF PRESENTING ILLNESS:  Jason Reilly 71 y.o. male is here because of abnormal CT findings, which is very suspicious for malignancy. He is on ranitidine from [place], has been on in the [place] for 16 years. He came in with his son and an interpreter.  He has been feeling fatigued since two month ago. He is still able to do all  ADLs. He otherwise denies any pain, bloating or nausea.  He lost about 20lbs in 3 month. His appetite is lower than before, eats less, no change of his bowl habits.  She denied any hematochezia or melana. Per his son, he has had some personality changes daily, irritable, slightly confused some time.  He was evaluated by his primary care physician. Lab test reviewed hepatitis B infection, which he did not know before, and elevated alkaline phosphatase, his liver function and the rest of the liver function was not remarkable. [place] of abdomen was obtained on 07/22/2014, which showed diffusely abnormal liver with multiple echogenic lesions. CT of abdomen with and without contrast was done on 08/26/2014, which reviewed here at a medically with multiple large partially calcified hepatic masses consistent with metastatic disease. Mild retroperitoneal adenopathy with the largest node measuring 1.6 cm. And nonspecific 1.4 cm left adrenal nodule was also noticed. His tumor marker showed CEA greater than 10,000, CA 19-9 12,929, AFP 3.2 (normal). He was referred to [facility] and was evaluated by nurse practitioner Jason Reilly. Treatment for hepatitis B was not recommended based on his virus load.  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 He was evaluated by a cardiologist in 2014. He denies any significant dyspnea on exertion. No leg swollen.  CURRENT THERAPY:  1. panitumumab 30m/kg, every 2 weeks, started on 07/12/2015 2. Irinotecan 1811mm2 every 2 weeks added on 09/19/2015, dose decreased to 16082m2 from cycle 2, and further decreased to 125m85m from cycle 10 due to diarrhea and anorexia, changed to 140mg5mfrom cycle 13. 3. 5-FU 1800mg/40mver 46 hours added back from 06/10/2016 due to mild disease progression 4. He also receives IV magnesium 6g weekly   INTERIM HISTORY: [name] for follow-up and chemotherapy. He is accompanied by  his son and a translOptometrist. He is doing well overall. He is not having any cold symptoms but is wearing a mask to avoid exposure. His appetite and energy level are good. His bowel movements are good. He has lost a little weight. He has been taking his diabetes medication. His wife watches his diet. He ate rice and soup this morning for breakfast. The patient and his family had planned a flight and cruise in the [place] which was already paid for. His son is requesting a doctor's note so they can receive a refund for these expenses.  MEDICAL HISTORY:  Past Medical History:  Diagnosis Date  . Abnormal EKG   . Diabetes mellitus without complication (HCC)    metformin  . Gastric ulcer   . H. pylori infection   . Hepatitis B   . Hypertension   . Metastatic colon cancer to liver (HCC)  [place]  [place]  Tachycardia     SURGICAL HISTORY: Past Surgical History:  Procedure Laterality Date  . ESOPHAGOGASTRODUODENOSCOPY N/A 02/08/2015   Procedure: ESOPHAGOGASTRODUODENOSCOPY (EGD);  Surgeon: Jason Artist, MD;  Location: [facility];  Service: Endoscopy;  Laterality: N/A;  . ESOPHAGOGASTRODUODENOSCOPY (EGD) WITH PROPOFOL N/A 08/15/2015   Procedure: ESOPHAGOGASTRODUODENOSCOPY (EGD) WITH PROPOFOL;  Surgeon: Jason Artist, MD;  Location: [facility];  Service: Endoscopy;  Laterality: N/A;  . LEFT AND RIGHT HEART CATHETERIZATION WITH CORONARY ANGIOGRAM N/A 03/29/2013   Procedure: LEFT AND RIGHT HEART CATHETERIZATION WITH CORONARY ANGIOGRAM;  Surgeon: Pixie Casino, MD;  Location: [facility] CATH LAB;  Service: Cardiovascular;  Laterality: N/A;  . Nuclear Stress Test  03/03/2013   High risk - consistent with nonischemic cardiomyopathy    SOCIAL HISTORY: Social History   Social History  . Marital status: Married    Spouse name: N/A  . Number of children: N/A  . Years of education: N/A   Occupational History  . Not on file.   Social History Main Topics  . Smoking status:  Former Smoker    Types: Cigarettes    Quit date: 11/07/2008  . Smokeless tobacco: Never Used  . Alcohol use Yes     Comment: occasional  . Drug use: No  . Sexual activity: No   Other Topics Concern  . Not on file   Social History Narrative   Married   Enjoys walking   Has lived in [place] > 16 years    FAMILY HISTORY: No family history of liver disease or malignancy.  ALLERGIES:  has No Known Allergies.  MEDICATIONS:  Current Outpatient Prescriptions on File Prior to Visit  Medication Sig Dispense Refill  . clindamycin (CLINDAGEL) 1 % gel Apply topically 2 (two) times daily. 30 g 2  . diphenoxylate-atropine (LOMOTIL) 2.5-0.025 MG tablet Take 1-2 tablets by mouth 4 (four) times daily as needed for diarrhea or loose stools. 30 tablet 0  . folic acid (FOLVITE) 1 MG tablet Take 1 tablet (1 mg total) by mouth daily. 30 tablet 3  . hydrocortisone 1 % ointment Apply 1 application topically 2 (two) times daily. 56 g 0  . lisinopril-hydrochlorothiazide (PRINZIDE,ZESTORETIC) 10-12.5 MG tablet     . loperamide (IMODIUM) 2 MG capsule Take 2 capsules (4 mg total) by mouth 4 (four) times daily as needed for diarrhea or loose stools (NO MORE THAN 8 TABLETS PER DAY.). 60 capsule 3  . magic mouthwash SOLN Take 5 mLs by mouth 4 (four) times daily. 120 mL 1  . magnesium oxide (MAG-OX) 400 (241.3 Mg) MG tablet Take 1 tablet (400 mg total) by mouth 3 (three) times daily. 90 tablet 3  . metFORMIN (GLUCOPHAGE) 500 MG tablet Take 1 tablet (500 mg total) by mouth 2 (two) times daily with a meal. 60 tablet 0  . nadolol (CORGARD) 20 MG tablet Take 1 tablet (20 mg total) by mouth 2 (two) times daily. THIS IS A ONE TIME ORDER.  FUTURE REFILLS NEED TO BE DONE BY PRIMARY MD. 60 tablet 1  . ondansetron (ZOFRAN) 8 MG tablet Take 1 tablet (8 mg total) by mouth every 8 (eight) hours as needed for nausea or vomiting. 45 tablet 2  . pantoprazole (PROTONIX) 40 MG tablet Take 1 tablet (40 mg total) by mouth daily. 30 tablet  2  . prochlorperazine (COMPAZINE) 10 MG tablet Take 1 tablet (10 mg total) by mouth every 6 (six) hours as needed for nausea or vomiting. 30 tablet 2   Current Facility-Administered Medications on  File Prior to Visit  Medication Dose Route Frequency Provider Last Rate Last Dose  . 0.9 %  sodium chloride infusion   Intravenous Once Jason Merle, MD      . ferumoxytol [facility]) 510 mg in sodium chloride 0.9 % 100 mL IVPB  510 mg Intravenous Once Jason Merle, MD      . sodium chloride 0.9 % injection 10 mL  10 mL Intracatheter PRN Jason Merle, MD   10 mL at 10/03/15 1705  . sodium chloride 0.9 % injection 10 mL  10 mL Intravenous PRN Jason Merle, MD   10 mL at 05/14/16 1127  ;   REVIEW OF SYSTEMS:   Constitutional: Denies fevers, chills or abnormal night sweats, (+) weight loss Eyes: Denies blurriness of vision, double vision or watery eyes Ears, nose, mouth, throat, and face: Denies mucositis or sore throat Respiratory: Denies cough, dyspnea or wheezes Cardiovascular: Denies palpitation, chest discomfort or lower extremity swelling Gastrointestinal: Denies nausea, heartburn or change in bowel habits Skin: Denies abnormal skin rashes Lymphatics: Denies new lymphadenopathy or easy bruising Neurological:Denies numbness, tingling or new weaknesses Behavioral/Psych: Mood is stable, no new changes, (+) insomnia All other systems were reviewed with the patient and are negative.  PHYSICAL EXAMINATION: BP 107/63 (BP Location: Left Arm, Patient Position: Sitting)   Pulse 69   Temp 98.2 F (36.8 C) (Oral)   Resp 18   Ht _0  (1.651 m)   Wt 140 lb (63.5 kg)   SpO2 100%   BMI 23.30 kg/m   ECOG PERFORMANCE STATUS: 1 GENERAL:alert, no distress and comfortable SKIN: (+) Dry skin,  no skin ulcer, (+) scatter skin rashes on his neck and upper chest, some are resolving with skin pigmentation. No discharge. EYES: normal, conjunctiva are pink and non-injected, sclera clear OROPHARYNX:no exudate, no erythema  and lips, buccal mucosa, and tongue with mild discoloration at the tip. The low front teeth are missing  NECK: supple, thyroid normal size, non-tender, without nodularity LYMPH:  no palpable lymphadenopathy in the cervical, axillary or inguinal LUNGS: clear to auscultation and percussion with normal breathing effort, no rales   HEART: regular rate & rhythm and no murmurs and no lower extremity edema ABDOMEN:abdomen soft, non-tender, no hepatomegaly, no splenomegaly and normal bowel sounds Musculoskeletal:no cyanosis of digits and no clubbing  PSYCH: alert & oriented x 3 with fluent speech NEURO: no focal motor/sensory deficits   LABORATORY DATA:  I have reviewed the data as listed CBC Latest Ref Rng & Units 12/02/2016 11/19/2016 11/11/2016  WBC 4.0 - 10.3 10e3/uL 5.4 6.3 4.6  Hemoglobin 13.0 - 17.1 g/dL 10.0(L) 10.6(L) 10.0(L)  Hematocrit 38.4 - 49.9 % 30.8(L) 32.5(L) 30.8(L)  Platelets 140 - 400 10e3/uL 183 174 269    CMP Latest Ref Rng & Units 12/02/2016 11/19/2016 11/06/2016  Glucose 70 - 140 mg/dl 267(H) 291(H) 170(H)  BUN 7.0 - 26.0 mg/dL 9.7 9.0 8.5  Creatinine 0.7 - 1.3 mg/dL 0.8 0.8 0.7  Sodium 136 - 145 mEq/L 139 138 142  Potassium 3.5 - 5.1 mEq/L 3.9 3.9 3.5  Chloride 101 - 111 mmol/L - - -  CO2 22 - 29 mEq/L _1 Calcium 8.4 - 10.4 mg/dL 7.9(L) 7.8(L) 7.6(L)  Total Protein 6.4 - 8.3 g/dL 6.9 6.8 6.7  Total Bilirubin 0.20 - 1.20 mg/dL 0.65 0.87 0.47  Alkaline Phos 40 - 150 U/L 200(H) 200(H) 198(H)  AST 5 - 34 U/L 30 33 24  ALT 0 - 55 U/L 32 22 20  INITIAL tumor markers AFP 3.2 CEA > 10,000 CA 19-9 12,929.6  CEA 01/11/2015: 1809 07/05/2015: 386 10/03/2015: 66.4 02/19/2016: 155.8 05/06/2016: 566 07/08/2016: 834 08/05/2016: 1102 10/07/2016: 1733 11/06/2016: 2,214.95   Pathology report  Liver, needle/core biopsy - METASTATIC ADENOCARCINOMA, SEE COMMENT. Microscopic Comment The adenocarcinoma demonstrates the following immunophenotype: Cytokeratin 7 - negative  expression. Cytokeratin 20 - strong diffuse expression. CD2 - strong diffuse expression. Overall the morphology and immunophenotype are that of metastatic adenocarcinoma primary to colorectum. The recent nuclear medicine scan demonstrating sigmoid mass with associated liver masses is noted.  [facility] test result:    RADIOGRAPHIC STUDIES: I have personally reviewed the outside CT scan image with patient and his son.   CT chest, abdomen and pelvis with IV contrast on  08/19/2016  IMPRESSION: 1. Respiratory motion degrades image quality in the lungs. Favor slight enlargement of a right lower lobe pulmonary nodule as well as development of a new pulmonary nodule in the left upper lobe. 2. Hepatic metastatic disease is grossly stable. Associated marginal irregularity of the liver is likely due to hepatic fibrosis from treated metastatic disease, with evidence of portal hypertension. 3. Right adrenal nodule is stable to minimally enlarged. 4. Aortic atherosclerosis (ICD10-170.0). Coronary artery Calcification.  CT CAP 11/12/2016 IMPRESSION: 1. No significant change in the appearance of multifocal pulmonary nodules. 2. Previously referenced partially calcified liver metastases are stable from previous exam. Within the posterior right lobe of liver there is a metastasis which appears slightly increased in size from previous exam. 3. Mild decrease in size of right adrenal nodule. 4. There is mild wall thickening and inflammation involving the descending colon and proximal sigmoid colon worrisome for colitis. A small intramural fluid collection is identified which is unchanged from 08/19/2016, significance unknown. 5. Aortic atherosclerosis.  ASSESSMENT & PLAN:  71 y.o. [place] male, with past history of hypertension and dilated nonischemic gammopathy with EF 25%, no clinical signs of heart failure, who was found to have hepatitis B infection lately, and multiple liver lesions on  the CT scan. He has extremely high CEA and CA 19-9 levels. PET scan reviewed a hypermetabolic sigmoid colon mass, diffuse liver metastasis, probable lung and adrenal gland metastasis.  1. Metastatic sigmoid colon cancer, with diffuse liver, lungs, node and left adrenal gland metastases. KRAS/NRAS wild type, MSI-stable -Liver biopsy showed metastatic adenocarcinoma. His tumor were strongly positive for CK20 and CD2, consistent with primary colorectal primary. KRAS and NRAS mutations were not detected.  -Pt understands that this is incurable cancer, and he has very high disease burden and overall prognosis is poor. The treatment goal is palliative -I previously discussed his restaging CT from 08/19/2016 which showed overall stable disease in liver, a few small lung nodules, slightly bigger, will continue monitoring. - his previous scan showed overall some improvement since we restarted on 5-FU in August 2017. -His tumor marker CEA has been trending up, concerning for slow disease progression, we'll continue monitoring -I previously reviewed his restaging CT scan from 11/12/2016, which showed overall stable disease, except one lesion in the liver got slightly bigger. He is clinically doing well, I'll continue his current chemotherapy -I'll repeat his staging scan in March 2018, for close follow-up. He likely needs change in chemotherapy treatment in the near future. -Lab reviewed, adequate for treatment, we'll proceed to chemotherapy today   2. Grade 1-2 skin rashes, stable  -Secondary to panitumumab, stable overall  -continue hydrocortisone 2.5%, and clindamycin gel 1%  twice daily as needed  -He knows to avoid  sun exposure, and call me if it gets worse.  3. Gastric ulcer with significant GI bleeding in April and Aug 2016 -He is on PPI, continue once daily  -Repeat his EGD on 08/15/2015 showed near complete healing of his gastric ulcer -continue Nadolol 73m bid, per Dr. SFuller Plan- I encourage him to  follow-up with Dr. SFuller [facility] since he has restarted 5-fu   4. Type 2 DM  -His glucose level has increased lately, it is 267 today. I encouraged him to watch his diet, avoid any sweets, and monitor closely at home -I encouraged him to follow-up with his primary care physician Dr. WJimmye [place] He is in the process to change his primary care physician. -I advised him to increase metformin from one to two tablets twice daily.  5. HTN, Dilated nonischemic ischemia cardiomyopathy with EF 25% -He is clinically doing well without symptoms of CHF. However this is probably going to impact his chemotherapy.Will try to avoid cardiotoxic chemotherapy agent and avoid fluid overload during chemotherapy. -Continue follow-up with cardiology. -will hold on his lisinopril-HCTZ for now, his BP has been normal lately   6 Hepatitis B carrier, with mild portal hypertension  -Per liver clinic, no need for treatment. Follow-up with Dr. SSilvio Pate  7. Malnutrition -I encouraged him to eat more, and take supplements as needed. -improved, gained weight back after I reduce his chemotherapy dose  -follow up with Dietitian   8. Anemia secondary to GI bleeding, iron deficiency and chemo  -Repeat lab on 06/06/2015 showed ferritin 117, serum iron 24, saturation 8%, which supports iron deficiency -He received IV Feraheme again in Aug 2016 after GI bleeding  -Repeat a ferritin was 273 on 10/03/2015, much improved  -he received iv feraheme again on 11/27/2015 and 12/04/2015, but anemia did not improve much. -His anemia has been slightly worse lately, probably related to chemotherapy, we'll close monitor any signs of GI bleeding. -Blood transfusion if hemoglobin less than 8 or symptomatic anemia with hemoglobin 8-9  9. hypomagnesemia  -secondary to panitumumab -he receives IV mag 6 g every week  -He is taking magnesium pill 2 tablets 3 times a day -continue monitoring    Plan -Lab reviewed, adequate for  treatment, we'll proceed FOLFIRI and panitumumab today and continue every 2 weeks.  -magnesium infusion weekly  -I have refilled EMLA cream today -I'll repeat his staging scan in March 2018, for close follow-up. I will order this at his next visit. -I recommend him to increase metformin to 1000 mg twice daily, to better control his hyperglycemia. -I'll see him back in 4 weeks.   All questions were answered. The patient knows to call the clinic with any problems, questions or concerns.  I spent 25 minutes counseling the patient face to face. The total time spent in the appointment was 30 minutes and more than 50% was on counseling.  I have reviewed the above documentation for accuracy and completeness, and I agree with the above information.    This document serves as a record of services personally performed by YTruitt Merle MD. It was created on her behalf by EArlyce Harman a trained medical scribe. The creation of this record is based on the scribe's personal observations and the provider's statements to them. This document has been checked and approved by the attending provider.   FTruitt Merle MD 12/02/16

## 2016-12-02 ENCOUNTER — Ambulatory Visit (HOSPITAL_BASED_OUTPATIENT_CLINIC_OR_DEPARTMENT_OTHER): Payer: Medicare Other

## 2016-12-02 ENCOUNTER — Encounter: Payer: Self-pay | Admitting: Hematology

## 2016-12-02 ENCOUNTER — Ambulatory Visit (HOSPITAL_BASED_OUTPATIENT_CLINIC_OR_DEPARTMENT_OTHER): Payer: Medicare Other | Admitting: Hematology

## 2016-12-02 ENCOUNTER — Other Ambulatory Visit (HOSPITAL_BASED_OUTPATIENT_CLINIC_OR_DEPARTMENT_OTHER): Payer: Medicare Other

## 2016-12-02 ENCOUNTER — Other Ambulatory Visit: Payer: Self-pay | Admitting: *Deleted

## 2016-12-02 VITALS — BP 107/63 | HR 69 | Temp 98.2°F | Resp 18 | Ht 65.0 in | Wt 140.0 lb

## 2016-12-02 DIAGNOSIS — K766 Portal hypertension: Secondary | ICD-10-CM

## 2016-12-02 DIAGNOSIS — L271 Localized skin eruption due to drugs and medicaments taken internally: Secondary | ICD-10-CM

## 2016-12-02 DIAGNOSIS — C787 Secondary malignant neoplasm of liver and intrahepatic bile duct: Secondary | ICD-10-CM | POA: Diagnosis not present

## 2016-12-02 DIAGNOSIS — Z5111 Encounter for antineoplastic chemotherapy: Secondary | ICD-10-CM

## 2016-12-02 DIAGNOSIS — Z452 Encounter for adjustment and management of vascular access device: Secondary | ICD-10-CM

## 2016-12-02 DIAGNOSIS — C187 Malignant neoplasm of sigmoid colon: Secondary | ICD-10-CM

## 2016-12-02 DIAGNOSIS — Z95828 Presence of other vascular implants and grafts: Secondary | ICD-10-CM

## 2016-12-02 DIAGNOSIS — C7800 Secondary malignant neoplasm of unspecified lung: Secondary | ICD-10-CM

## 2016-12-02 DIAGNOSIS — C7972 Secondary malignant neoplasm of left adrenal gland: Secondary | ICD-10-CM

## 2016-12-02 DIAGNOSIS — I10 Essential (primary) hypertension: Secondary | ICD-10-CM

## 2016-12-02 DIAGNOSIS — Z5112 Encounter for antineoplastic immunotherapy: Secondary | ICD-10-CM | POA: Diagnosis not present

## 2016-12-02 DIAGNOSIS — D6481 Anemia due to antineoplastic chemotherapy: Secondary | ICD-10-CM

## 2016-12-02 DIAGNOSIS — C772 Secondary and unspecified malignant neoplasm of intra-abdominal lymph nodes: Secondary | ICD-10-CM | POA: Diagnosis not present

## 2016-12-02 DIAGNOSIS — I5042 Chronic combined systolic (congestive) and diastolic (congestive) heart failure: Secondary | ICD-10-CM

## 2016-12-02 DIAGNOSIS — D509 Iron deficiency anemia, unspecified: Secondary | ICD-10-CM

## 2016-12-02 DIAGNOSIS — E46 Unspecified protein-calorie malnutrition: Secondary | ICD-10-CM

## 2016-12-02 DIAGNOSIS — E119 Type 2 diabetes mellitus without complications: Secondary | ICD-10-CM

## 2016-12-02 LAB — CBC WITH DIFFERENTIAL/PLATELET
BASO%: 0.5 % (ref 0.0–2.0)
BASOS ABS: 0 10*3/uL (ref 0.0–0.1)
EOS%: 3.6 % (ref 0.0–7.0)
Eosinophils Absolute: 0.2 10*3/uL (ref 0.0–0.5)
HCT: 30.8 % — ABNORMAL LOW (ref 38.4–49.9)
HGB: 10 g/dL — ABNORMAL LOW (ref 13.0–17.1)
LYMPH%: 12.4 % — AB (ref 14.0–49.0)
MCH: 27.3 pg (ref 27.2–33.4)
MCHC: 32.6 g/dL (ref 32.0–36.0)
MCV: 83.6 fL (ref 79.3–98.0)
MONO#: 0.6 10*3/uL (ref 0.1–0.9)
MONO%: 10.8 % (ref 0.0–14.0)
NEUT#: 3.9 10*3/uL (ref 1.5–6.5)
NEUT%: 72.7 % (ref 39.0–75.0)
PLATELETS: 183 10*3/uL (ref 140–400)
RBC: 3.68 10*6/uL — AB (ref 4.20–5.82)
RDW: 18.5 % — ABNORMAL HIGH (ref 11.0–14.6)
WBC: 5.4 10*3/uL (ref 4.0–10.3)
lymph#: 0.7 10*3/uL — ABNORMAL LOW (ref 0.9–3.3)

## 2016-12-02 LAB — MAGNESIUM: Magnesium: 0.8 mg/dl — CL (ref 1.5–2.5)

## 2016-12-02 LAB — COMPREHENSIVE METABOLIC PANEL
ALT: 32 U/L (ref 0–55)
AST: 30 U/L (ref 5–34)
Albumin: 3.1 g/dL — ABNORMAL LOW (ref 3.5–5.0)
Alkaline Phosphatase: 200 U/L — ABNORMAL HIGH (ref 40–150)
Anion Gap: 9 mEq/L (ref 3–11)
BUN: 9.7 mg/dL (ref 7.0–26.0)
CALCIUM: 7.9 mg/dL — AB (ref 8.4–10.4)
CHLORIDE: 105 meq/L (ref 98–109)
CO2: 25 mEq/L (ref 22–29)
Creatinine: 0.8 mg/dL (ref 0.7–1.3)
Glucose: 267 mg/dl — ABNORMAL HIGH (ref 70–140)
POTASSIUM: 3.9 meq/L (ref 3.5–5.1)
Sodium: 139 mEq/L (ref 136–145)
Total Bilirubin: 0.65 mg/dL (ref 0.20–1.20)
Total Protein: 6.9 g/dL (ref 6.4–8.3)

## 2016-12-02 MED ORDER — ONDANSETRON HCL 8 MG PO TABS
ORAL_TABLET | ORAL | Status: AC
  Filled 2016-12-02: qty 1

## 2016-12-02 MED ORDER — LIDOCAINE-PRILOCAINE 2.5-2.5 % EX CREA: 1.0000 "application " | TOPICAL_CREAM | CUTANEOUS | 2 refills | Status: DC | PRN

## 2016-12-02 MED ORDER — LIDOCAINE-PRILOCAINE 2.5-2.5 % EX CREA: 1.0000 "application " | TOPICAL_CREAM | CUTANEOUS | 2 refills | Status: AC | PRN

## 2016-12-02 MED ORDER — SODIUM CHLORIDE 0.9 % IJ SOLN
10.0000 mL | INTRAMUSCULAR | Status: DC | PRN
  Administered 2016-12-02: 10 mL via INTRAVENOUS
  Filled 2016-12-02: qty 10

## 2016-12-02 MED ORDER — ATROPINE SULFATE 1 MG/ML IJ SOLN
0.5000 mg | Freq: Once | INTRAMUSCULAR | Status: AC | PRN
Start: 2016-12-02 — End: 2016-12-02
  Administered 2016-12-02: 0.5 mg via INTRAVENOUS

## 2016-12-02 MED ORDER — SODIUM CHLORIDE 0.9 % IV SOLN
1800.0000 mg/m2 | INTRAVENOUS | Status: DC
  Administered 2016-12-02: 3050 mg via INTRAVENOUS
  Filled 2016-12-02: qty 61

## 2016-12-02 MED ORDER — DEXAMETHASONE SODIUM PHOSPHATE 10 MG/ML IJ SOLN
INTRAMUSCULAR | Status: AC
  Filled 2016-12-02: qty 1

## 2016-12-02 MED ORDER — SODIUM CHLORIDE 0.9 % IV SOLN
140.0000 mg/m2 | Freq: Once | INTRAVENOUS | Status: AC
  Administered 2016-12-02: 240 mg via INTRAVENOUS
  Filled 2016-12-02: qty 10

## 2016-12-02 MED ORDER — ATROPINE SULFATE 1 MG/ML IJ SOLN
INTRAMUSCULAR | Status: AC
  Filled 2016-12-02: qty 1

## 2016-12-02 MED ORDER — DEXAMETHASONE SODIUM PHOSPHATE 10 MG/ML IJ SOLN
10.0000 mg | Freq: Once | INTRAMUSCULAR | Status: AC
  Administered 2016-12-02: 10 mg via INTRAVENOUS

## 2016-12-02 MED ORDER — SODIUM CHLORIDE 0.9 % IJ SOLN
10.0000 mL | INTRAMUSCULAR | Status: DC | PRN
  Filled 2016-12-02: qty 10

## 2016-12-02 MED ORDER — ONDANSETRON HCL 8 MG PO TABS
8.0000 mg | ORAL_TABLET | Freq: Once | ORAL | Status: AC
  Administered 2016-12-02: 8 mg via ORAL

## 2016-12-02 MED ORDER — HEPARIN SOD (PORK) LOCK FLUSH 100 UNIT/ML IV SOLN
500.0000 [IU] | Freq: Once | INTRAVENOUS | Status: DC | PRN
  Filled 2016-12-02: qty 5

## 2016-12-02 MED ORDER — SODIUM CHLORIDE 0.9 % IV SOLN
Freq: Once | INTRAVENOUS | Status: AC
  Administered 2016-12-02: 12:00:00 via INTRAVENOUS

## 2016-12-02 MED ORDER — SODIUM CHLORIDE 0.9 % IV SOLN
6.2000 mg/kg | Freq: Once | INTRAVENOUS | Status: AC
  Administered 2016-12-02: 400 mg via INTRAVENOUS
  Filled 2016-12-02: qty 20

## 2016-12-02 NOTE — Progress Notes (Signed)
Ok to treat with Magnesium today per Meredeth Ide, RN per MD Bur[facility] Medico. To receive magnesium infusion with pump d/c

## 2016-12-02 NOTE — Patient Instructions (Signed)

## 2016-12-02 NOTE — Patient Instructions (Signed)
[facility] Discharge Instructions for Patients Receiving Chemotherapy  Today you received the following chemotherapy agents: Irinotecan, Vectibix, Adrucil   To help prevent nausea and vomiting after your treatment, we encourage you to take your nausea medication as directed.   If you develop nausea and vomiting that is not controlled by your nausea medication, call the clinic.   BELOW ARE SYMPTOMS THAT SHOULD BE REPORTED IMMEDIATELY:  *FEVER GREATER THAN 100.5 F  *CHILLS WITH OR WITHOUT FEVER  NAUSEA AND VOMITING THAT IS NOT CONTROLLED WITH YOUR NAUSEA MEDICATION  *UNUSUAL SHORTNESS OF BREATH  *UNUSUAL BRUISING OR BLEEDING  TENDERNESS IN MOUTH AND THROAT WITH OR WITHOUT PRESENCE OF ULCERS  *URINARY PROBLEMS  *BOWEL PROBLEMS  UNUSUAL RASH Items with * indicate a potential emergency and should be followed up as soon as possible.  Feel free to call the clinic you have any questions or concerns. The clinic phone number is [phone].  Please show the CHEMO ALERT CARD at check-in to the Emergency Department and triage nurse.   

## 2016-12-04 ENCOUNTER — Ambulatory Visit (HOSPITAL_BASED_OUTPATIENT_CLINIC_OR_DEPARTMENT_OTHER): Payer: Medicare Other

## 2016-12-04 VITALS — BP 131/55 | HR 61 | Temp 97.3°F | Resp 16

## 2016-12-04 DIAGNOSIS — D509 Iron deficiency anemia, unspecified: Secondary | ICD-10-CM

## 2016-12-04 DIAGNOSIS — C187 Malignant neoplasm of sigmoid colon: Secondary | ICD-10-CM

## 2016-12-04 MED ORDER — SODIUM CHLORIDE 0.9 % IV SOLN: 6.0000 g | Freq: Once | INTRAVENOUS | Status: DC

## 2016-12-04 MED ORDER — SODIUM CHLORIDE 0.9 % IJ SOLN
10.0000 mL | INTRAMUSCULAR | Status: DC | PRN
  Administered 2016-12-04: 10 mL
  Filled 2016-12-04: qty 10

## 2016-12-04 MED ORDER — SODIUM CHLORIDE 0.9 % IV SOLN
Freq: Once | INTRAVENOUS | Status: AC
  Administered 2016-12-04: 12:00:00 via INTRAVENOUS
  Filled 2016-12-04: qty 250

## 2016-12-04 MED ORDER — HEPARIN SOD (PORK) LOCK FLUSH 100 UNIT/ML IV SOLN
500.0000 [IU] | Freq: Once | INTRAVENOUS | Status: AC | PRN
  Administered 2016-12-04: 500 [IU]
  Filled 2016-12-04: qty 5

## 2016-12-04 MED ORDER — SODIUM CHLORIDE 0.9 % IV SOLN
Freq: Once | INTRAVENOUS | Status: AC
  Administered 2016-12-04: 12:00:00 via INTRAVENOUS

## 2016-12-04 NOTE — Patient Instructions (Signed)
Hypomagnesemia Introduction Hypomagnesemia is a condition in which the level of magnesium in the blood is low. Magnesium is a mineral that is found in many foods. It is used in many different processes in the body. Hypomagnesemia can affect every organ in the body. It can cause life-threatening problems. What are the causes? Causes of hypomagnesemia include:  Not getting enough magnesium in your diet.  Malnutrition.  Problems with absorbing magnesium from the intestines.  Dehydration.  Alcohol abuse.  Vomiting.  Severe diarrhea.  Some medicines, including medicines that make you urinate more.  Certain diseases, such as kidney disease, diabetes, and overactive thyroid. What are the signs or symptoms?  Involuntary shaking or trembling of a body part (tremor).  Confusion.  Muscle weakness.  Sensitivity to light, sound, and touch.  Psychiatric issues, such as depression, irritability, or psychosis.  Sudden tightening of muscles (muscle spasms).  Tingling in the arms and legs.  A feeling of fluttering of the heart. These symptoms are more severe if magnesium levels drop suddenly. How is this diagnosed? To make a diagnosis, your health care provider will do a physical exam and order blood and urine tests. How is this treated? Treatment will depend on the cause and the severity of your condition. It may involve:  A magnesium supplement. This can be taken in pill form. It can also be given through an IV tube. This is usually done if the condition is severe.  Changes to your diet. You may be directed to eat foods that have a lot of magnesium, such as green leafy vegetables, peas, beans, and nuts.  Eliminating alcohol from your diet. Follow these instructions at home:  Include foods with magnesium in your diet. Foods that are rich in magnesium include green vegetables, beans, nuts and seeds, and whole grains.  Take medicines only as directed by your health care  provider.  Take magnesium supplements if your health care provider instructs you to do that. Take them as directed.  Have your magnesium levels monitored as directed by your health care provider.  When you are active, drink fluids that contain electrolytes.  Keep all follow-up visits as directed by your health care provider. This is important. Contact a health care provider if:  You get worse instead of better.  Your symptoms return. Get help right away if:  Your symptoms are severe. This information is not intended to replace advice given to you by your health care provider. Make sure you discuss any questions you have with your health care provider. Document Released: 07/10/2005 Document Revised: 03/21/2016 Document Reviewed: 05/30/2014  2017 Elsevier

## 2016-12-07 ENCOUNTER — Telehealth: Payer: Self-pay | Admitting: Hematology

## 2016-12-07 NOTE — Telephone Encounter (Signed)
Message sent to chemo scheduler to be added per 12/02/16 los. Appointments scheduled per 12/02/16 los. Patient will pick up schedule at next visit.

## 2016-12-10 ENCOUNTER — Telehealth: Payer: Self-pay | Admitting: *Deleted

## 2016-12-10 NOTE — Telephone Encounter (Addendum)
Per 2/5 LOS and staff message I have scheduled appts. Notified the scheduler first available given

## 2016-12-11 ENCOUNTER — Ambulatory Visit (HOSPITAL_BASED_OUTPATIENT_CLINIC_OR_DEPARTMENT_OTHER): Payer: Medicare Other

## 2016-12-11 VITALS — BP 112/79 | HR 68 | Temp 98.2°F | Resp 18

## 2016-12-11 DIAGNOSIS — Z95828 Presence of other vascular implants and grafts: Secondary | ICD-10-CM

## 2016-12-11 DIAGNOSIS — D509 Iron deficiency anemia, unspecified: Secondary | ICD-10-CM

## 2016-12-11 MED ORDER — HEPARIN SOD (PORK) LOCK FLUSH 100 UNIT/ML IV SOLN
500.0000 [IU] | Freq: Once | INTRAVENOUS | Status: DC | PRN
  Filled 2016-12-11: qty 5

## 2016-12-11 MED ORDER — SODIUM CHLORIDE 0.9 % IJ SOLN
10.0000 mL | INTRAMUSCULAR | Status: DC | PRN
  Filled 2016-12-11: qty 10

## 2016-12-11 MED ORDER — SODIUM CHLORIDE 0.9 % IV SOLN
Freq: Once | INTRAVENOUS | Status: AC
  Administered 2016-12-11: 12:00:00 via INTRAVENOUS
  Filled 2016-12-11: qty 250

## 2016-12-11 NOTE — Patient Instructions (Signed)
Hypomagnesemia Introduction Hypomagnesemia is a condition in which the level of magnesium in the blood is low. Magnesium is a mineral that is found in many foods. It is used in many different processes in the body. Hypomagnesemia can affect every organ in the body. It can cause life-threatening problems. What are the causes? Causes of hypomagnesemia include:  Not getting enough magnesium in your diet.  Malnutrition.  Problems with absorbing magnesium from the intestines.  Dehydration.  Alcohol abuse.  Vomiting.  Severe diarrhea.  Some medicines, including medicines that make you urinate more.  Certain diseases, such as kidney disease, diabetes, and overactive thyroid. What are the signs or symptoms?  Involuntary shaking or trembling of a body part (tremor).  Confusion.  Muscle weakness.  Sensitivity to light, sound, and touch.  Psychiatric issues, such as depression, irritability, or psychosis.  Sudden tightening of muscles (muscle spasms).  Tingling in the arms and legs.  A feeling of fluttering of the heart. These symptoms are more severe if magnesium levels drop suddenly. How is this diagnosed? To make a diagnosis, your health care provider will do a physical exam and order blood and urine tests. How is this treated? Treatment will depend on the cause and the severity of your condition. It may involve:  A magnesium supplement. This can be taken in pill form. It can also be given through an IV tube. This is usually done if the condition is severe.  Changes to your diet. You may be directed to eat foods that have a lot of magnesium, such as green leafy vegetables, peas, beans, and nuts.  Eliminating alcohol from your diet. Follow these instructions at home:  Include foods with magnesium in your diet. Foods that are rich in magnesium include green vegetables, beans, nuts and seeds, and whole grains.  Take medicines only as directed by your health care  provider.  Take magnesium supplements if your health care provider instructs you to do that. Take them as directed.  Have your magnesium levels monitored as directed by your health care provider.  When you are active, drink fluids that contain electrolytes.  Keep all follow-up visits as directed by your health care provider. This is important. Contact a health care provider if:  You get worse instead of better.  Your symptoms return. Get help right away if:  Your symptoms are severe. This information is not intended to replace advice given to you by your health care provider. Make sure you discuss any questions you have with your health care provider. Document Released: 07/10/2005 Document Revised: 03/21/2016 Document Reviewed: 05/30/2014  2017 Elsevier

## 2016-12-12 DIAGNOSIS — Z0000 Encounter for general adult medical examination without abnormal findings: Secondary | ICD-10-CM | POA: Diagnosis not present

## 2016-12-12 DIAGNOSIS — E11610 Type 2 diabetes mellitus with diabetic neuropathic arthropathy: Secondary | ICD-10-CM | POA: Diagnosis not present

## 2016-12-12 DIAGNOSIS — Z8719 Personal history of other diseases of the digestive system: Secondary | ICD-10-CM | POA: Diagnosis not present

## 2016-12-12 DIAGNOSIS — I10 Essential (primary) hypertension: Secondary | ICD-10-CM | POA: Diagnosis not present

## 2016-12-12 DIAGNOSIS — Z8619 Personal history of other infectious and parasitic diseases: Secondary | ICD-10-CM | POA: Diagnosis not present

## 2016-12-16 ENCOUNTER — Telehealth: Payer: Self-pay | Admitting: Hematology

## 2016-12-16 ENCOUNTER — Ambulatory Visit (HOSPITAL_BASED_OUTPATIENT_CLINIC_OR_DEPARTMENT_OTHER): Payer: Medicare Other

## 2016-12-16 ENCOUNTER — Other Ambulatory Visit (HOSPITAL_BASED_OUTPATIENT_CLINIC_OR_DEPARTMENT_OTHER): Payer: Medicare Other

## 2016-12-16 VITALS — BP 123/69 | HR 64 | Temp 98.4°F | Resp 18

## 2016-12-16 DIAGNOSIS — C187 Malignant neoplasm of sigmoid colon: Secondary | ICD-10-CM | POA: Diagnosis not present

## 2016-12-16 DIAGNOSIS — Z5111 Encounter for antineoplastic chemotherapy: Secondary | ICD-10-CM

## 2016-12-16 DIAGNOSIS — Z452 Encounter for adjustment and management of vascular access device: Secondary | ICD-10-CM

## 2016-12-16 DIAGNOSIS — Z95828 Presence of other vascular implants and grafts: Secondary | ICD-10-CM

## 2016-12-16 DIAGNOSIS — Z5112 Encounter for antineoplastic immunotherapy: Secondary | ICD-10-CM

## 2016-12-16 LAB — CBC WITH DIFFERENTIAL/PLATELET
BASO%: 0.5 % (ref 0.0–2.0)
Basophils Absolute: 0 10*3/uL (ref 0.0–0.1)
EOS%: 2.9 % (ref 0.0–7.0)
Eosinophils Absolute: 0.1 10*3/uL (ref 0.0–0.5)
HCT: 27.6 % — ABNORMAL LOW (ref 38.4–49.9)
HGB: 9 g/dL — ABNORMAL LOW (ref 13.0–17.1)
LYMPH%: 12.8 % — AB (ref 14.0–49.0)
MCH: 26.9 pg — ABNORMAL LOW (ref 27.2–33.4)
MCHC: 32.6 g/dL (ref 32.0–36.0)
MCV: 82.6 fL (ref 79.3–98.0)
MONO#: 0.6 10*3/uL (ref 0.1–0.9)
MONO%: 13.7 % (ref 0.0–14.0)
NEUT%: 70.1 % (ref 39.0–75.0)
NEUTROS ABS: 2.9 10*3/uL (ref 1.5–6.5)
Platelets: 117 10*3/uL — ABNORMAL LOW (ref 140–400)
RBC: 3.34 10*6/uL — AB (ref 4.20–5.82)
RDW: 17.5 % — ABNORMAL HIGH (ref 11.0–14.6)
WBC: 4.2 10*3/uL (ref 4.0–10.3)
lymph#: 0.5 10*3/uL — ABNORMAL LOW (ref 0.9–3.3)

## 2016-12-16 LAB — COMPREHENSIVE METABOLIC PANEL
ALT: 23 U/L (ref 0–55)
AST: 33 U/L (ref 5–34)
Albumin: 3.2 g/dL — ABNORMAL LOW (ref 3.5–5.0)
Alkaline Phosphatase: 179 U/L — ABNORMAL HIGH (ref 40–150)
Anion Gap: 9 mEq/L (ref 3–11)
BILIRUBIN TOTAL: 0.46 mg/dL (ref 0.20–1.20)
BUN: 6.9 mg/dL — ABNORMAL LOW (ref 7.0–26.0)
CO2: 23 mEq/L (ref 22–29)
Calcium: 7.7 mg/dL — ABNORMAL LOW (ref 8.4–10.4)
Chloride: 109 mEq/L (ref 98–109)
Creatinine: 0.7 mg/dL (ref 0.7–1.3)
Glucose: 98 mg/dl (ref 70–140)
Potassium: 3.4 mEq/L — ABNORMAL LOW (ref 3.5–5.1)
Sodium: 142 mEq/L (ref 136–145)
TOTAL PROTEIN: 6.7 g/dL (ref 6.4–8.3)

## 2016-12-16 LAB — CEA (IN HOUSE-CHCC)

## 2016-12-16 LAB — MAGNESIUM: MAGNESIUM: 0.8 mg/dL — AB (ref 1.5–2.5)

## 2016-12-16 MED ORDER — SODIUM CHLORIDE 0.9 % IV SOLN
Freq: Once | INTRAVENOUS | Status: AC
Start: 2016-12-16 — End: 2016-12-16
  Administered 2016-12-16: 13:00:00 via INTRAVENOUS

## 2016-12-16 MED ORDER — POTASSIUM CHLORIDE CRYS ER 20 MEQ PO TBCR: 20.0000 meq | EXTENDED_RELEASE_TABLET | Freq: Every day | ORAL | 0 refills | Status: AC

## 2016-12-16 MED ORDER — POTASSIUM CHLORIDE CRYS ER 20 MEQ PO TBCR: 20.0000 meq | EXTENDED_RELEASE_TABLET | Freq: Two times a day (BID) | ORAL | 0 refills | Status: DC

## 2016-12-16 MED ORDER — SODIUM CHLORIDE 0.9 % IJ SOLN
10.0000 mL | INTRAMUSCULAR | Status: DC | PRN
  Administered 2016-12-16: 10 mL via INTRAVENOUS
  Filled 2016-12-16: qty 10

## 2016-12-16 MED ORDER — DEXAMETHASONE SODIUM PHOSPHATE 10 MG/ML IJ SOLN
INTRAMUSCULAR | Status: AC
  Filled 2016-12-16: qty 1

## 2016-12-16 MED ORDER — SODIUM CHLORIDE 0.9 % IV SOLN
6.1000 mg/kg | Freq: Once | INTRAVENOUS | Status: AC
  Administered 2016-12-16: 400 mg via INTRAVENOUS
  Filled 2016-12-16: qty 20

## 2016-12-16 MED ORDER — ONDANSETRON HCL 8 MG PO TABS
8.0000 mg | ORAL_TABLET | Freq: Once | ORAL | Status: AC
  Administered 2016-12-16: 8 mg via ORAL

## 2016-12-16 MED ORDER — ONDANSETRON HCL 8 MG PO TABS
ORAL_TABLET | ORAL | Status: AC
  Filled 2016-12-16: qty 1

## 2016-12-16 MED ORDER — SODIUM CHLORIDE 0.9 % IV SOLN
140.0000 mg/m2 | Freq: Once | INTRAVENOUS | Status: AC
  Administered 2016-12-16: 240 mg via INTRAVENOUS
  Filled 2016-12-16: qty 10

## 2016-12-16 MED ORDER — ATROPINE SULFATE 1 MG/ML IJ SOLN
0.5000 mg | Freq: Once | INTRAMUSCULAR | Status: AC | PRN
  Administered 2016-12-16: 0.5 mg via INTRAVENOUS

## 2016-12-16 MED ORDER — ATROPINE SULFATE 1 MG/ML IJ SOLN
INTRAMUSCULAR | Status: AC
  Filled 2016-12-16: qty 1

## 2016-12-16 MED ORDER — SODIUM CHLORIDE 0.9 % IV SOLN
1800.0000 mg/m2 | INTRAVENOUS | Status: DC
  Administered 2016-12-16: 3050 mg via INTRAVENOUS
  Filled 2016-12-16: qty 61

## 2016-12-16 MED ORDER — DEXAMETHASONE SODIUM PHOSPHATE 10 MG/ML IJ SOLN
10.0000 mg | Freq: Once | INTRAMUSCULAR | Status: AC
  Administered 2016-12-16: 10 mg via INTRAVENOUS

## 2016-12-16 NOTE — Patient Instructions (Signed)
[facility] Discharge Instructions for Patients Receiving Chemotherapy  Today you received the following chemotherapy agents: Irinotecan, Vectibix, Adrucil   To help prevent nausea and vomiting after your treatment, we encourage you to take your nausea medication as directed.   If you develop nausea and vomiting that is not controlled by your nausea medication, call the clinic.   BELOW ARE SYMPTOMS THAT SHOULD BE REPORTED IMMEDIATELY:  *FEVER GREATER THAN 100.5 F  *CHILLS WITH OR WITHOUT FEVER  NAUSEA AND VOMITING THAT IS NOT CONTROLLED WITH YOUR NAUSEA MEDICATION  *UNUSUAL SHORTNESS OF BREATH  *UNUSUAL BRUISING OR BLEEDING  TENDERNESS IN MOUTH AND THROAT WITH OR WITHOUT PRESENCE OF ULCERS  *URINARY PROBLEMS  *BOWEL PROBLEMS  UNUSUAL RASH Items with * indicate a potential emergency and should be followed up as soon as possible.  Feel free to call the clinic you have any questions or concerns. The clinic phone number is [phone].  Please show the CHEMO ALERT CARD at check-in to the Emergency Department and triage nurse.   

## 2016-12-16 NOTE — Telephone Encounter (Signed)
Patient came to scheduling to pick up print outs of future scheduled appointments.

## 2016-12-16 NOTE — Progress Notes (Signed)
Per Lavella Lemons RN per Dr. Benay Spice pt to start taking potassium 20 MEQs PO daily until follow up visit with [name]. Pt and Patient's son aware and verbalizes understanding.

## 2016-12-16 NOTE — Progress Notes (Signed)
Magnesium level 0.8. Pt will receive IV magnesium Day 3 and continues oral supplementation TID. OK to treat today, per Dr. Benay Spice. Maudie Mercury, Infusion RN made aware. Dr. Benay Spice recommends KCL 80meq daily. Script sent to pharmacy. Infusion RN will make pt aware.

## 2016-12-17 ENCOUNTER — Ambulatory Visit (HOSPITAL_BASED_OUTPATIENT_CLINIC_OR_DEPARTMENT_OTHER): Payer: Medicare Other

## 2016-12-17 DIAGNOSIS — Z5111 Encounter for antineoplastic chemotherapy: Secondary | ICD-10-CM

## 2016-12-17 DIAGNOSIS — Z452 Encounter for adjustment and management of vascular access device: Secondary | ICD-10-CM | POA: Diagnosis not present

## 2016-12-17 NOTE — Progress Notes (Signed)
Patient received a chemotherapy ambulatory infusion pump yesterday afternoon as part of his chemotherapy infusion treatment. Patient and daughter arrive this morning to infusion room stating his needle came out through the night when he was sleeping, patient states he noticed the needle had came out of his port at approximately 0630 today. Safety lock engaged on needle, pump actively infusing chemotherapy noticed in patient's fanny pack where chemotherapy and pump located. Patient's daughter stated chemotherapy cleaned up at home with chemotherapy clean up bucket provided by the cancer center. Bag discarded per chemotherapy handling protocol, ambulatory pump cleaned, pharmacy to put remainder of chemotherapy 99 mL in new chemotherapy bag per [name]. New fanny pack and chemotherapy cleanup bucket given to patient. Patient and daughter verbalized understanding.

## 2016-12-18 ENCOUNTER — Ambulatory Visit (HOSPITAL_BASED_OUTPATIENT_CLINIC_OR_DEPARTMENT_OTHER): Payer: Medicare Other

## 2016-12-18 VITALS — BP 129/69 | HR 62 | Temp 98.0°F | Resp 20

## 2016-12-18 DIAGNOSIS — D509 Iron deficiency anemia, unspecified: Secondary | ICD-10-CM

## 2016-12-18 MED ORDER — HEPARIN SOD (PORK) LOCK FLUSH 100 UNIT/ML IV SOLN
500.0000 [IU] | Freq: Once | INTRAVENOUS | Status: AC
  Administered 2016-12-18: 500 [IU] via INTRAVENOUS
  Filled 2016-12-18: qty 5

## 2016-12-18 MED ORDER — SODIUM CHLORIDE 0.9 % IV SOLN
Freq: Once | INTRAVENOUS | Status: AC
  Administered 2016-12-18: 14:00:00 via INTRAVENOUS
  Filled 2016-12-18: qty 250

## 2016-12-18 MED ORDER — SODIUM CHLORIDE 0.9% FLUSH
10.0000 mL | INTRAVENOUS | Status: DC | PRN
  Administered 2016-12-18: 10 mL via INTRAVENOUS
  Filled 2016-12-18: qty 10

## 2016-12-18 NOTE — Patient Instructions (Signed)
Magnesium Sulfate injection What is this medicine? MAGNESIUM SULFATE (mag NEE zee um SUL fate) is an electrolyte injection commonly used to treat low magnesium levels in your blood and to prevent or control certain seizures. What should I tell my health care provider before I take this medicine? They need to know if you have any of these conditions: -heart disease -history of irregular heart beat -kidney disease -an unusual or allergic reaction to magnesium sulfate, medicines, foods, dyes, or preservatives -pregnant or trying to get pregnant -breast-feeding How should I use this medicine? This medicine is for infusion into a vein. It is given by a health care professional in a hospital or clinic setting. Talk to your pediatrician regarding the use of this medicine in children. While this drug may be prescribed for selected conditions, precautions do apply. What if I miss a dose? This does not apply. What may interact with this medicine? This medicine may interact with the following medications: -certain medicines for anxiety or sleep -certain medicines for seizures like phenobarbital -digoxin -medicines that relax muscles for surgery -narcotic medicines for pain What should I watch for while using this medicine? Your condition will be monitored carefully while you are receiving this medicine. You may need blood work done while you are receiving this medicine. What side effects may I notice from receiving this medicine? Side effects that you should report to your doctor or health care professional as soon as possible: -allergic reactions like skin rash, itching or hives, swelling of the face, lips, or tongue -facial flushing -muscle weakness -signs and symptoms of low blood pressure like dizziness; feeling faint or lightheaded, falls; unusually weak or tired -signs and symptoms of a dangerous change in heartbeat or heart rhythm like chest pain; dizziness; fast or irregular heartbeat;  palpitations; breathing problems -sweating Where should I keep my medicine? This drug is given in a hospital or clinic and will not be stored at home.  2017 Elsevier/Gold Standard (2015-11-16 08:24:50)

## 2016-12-25 ENCOUNTER — Ambulatory Visit (HOSPITAL_BASED_OUTPATIENT_CLINIC_OR_DEPARTMENT_OTHER): Payer: Medicare Other

## 2016-12-25 VITALS — BP 93/80 | HR 71 | Temp 98.6°F | Resp 18 | Ht 65.0 in | Wt 140.6 lb

## 2016-12-25 DIAGNOSIS — D509 Iron deficiency anemia, unspecified: Secondary | ICD-10-CM

## 2016-12-25 DIAGNOSIS — Z95828 Presence of other vascular implants and grafts: Secondary | ICD-10-CM

## 2016-12-25 MED ORDER — SODIUM CHLORIDE 0.9 % IV SOLN
Freq: Once | INTRAVENOUS | Status: AC
  Administered 2016-12-25: 14:00:00 via INTRAVENOUS
  Filled 2016-12-25: qty 250

## 2016-12-25 MED ORDER — ALTEPLASE 2 MG IJ SOLR
2.0000 mg | Freq: Once | INTRAMUSCULAR | Status: DC | PRN
  Filled 2016-12-25: qty 2

## 2016-12-25 MED ORDER — SODIUM CHLORIDE 0.9 % IV SOLN: 6.0000 g | Freq: Once | INTRAVENOUS | Status: DC

## 2016-12-25 MED ORDER — HEPARIN SOD (PORK) LOCK FLUSH 100 UNIT/ML IV SOLN
500.0000 [IU] | Freq: Once | INTRAVENOUS | Status: AC | PRN
Start: 2016-12-25 — End: 2016-12-25
  Administered 2016-12-25: 500 [IU] via INTRAVENOUS
  Filled 2016-12-25: qty 5

## 2016-12-25 MED ORDER — SODIUM CHLORIDE 0.9 % IJ SOLN
10.0000 mL | INTRAMUSCULAR | Status: DC | PRN
  Administered 2016-12-25: 10 mL via INTRAVENOUS
  Filled 2016-12-25: qty 10

## 2016-12-25 MED ORDER — SODIUM CHLORIDE 0.9 % IV SOLN
Freq: Once | INTRAVENOUS | Status: AC
  Administered 2016-12-25: 14:00:00 via INTRAVENOUS

## 2016-12-25 NOTE — Patient Instructions (Signed)
Hypomagnesemia Hypomagnesemia is a condition in which the level of magnesium in the blood is low. Magnesium is a mineral that is found in many foods. It is used in many different processes in the body. Hypomagnesemia can affect every organ in the body. It can cause life-threatening problems. What are the causes? Causes of hypomagnesemia include:  Not getting enough magnesium in your diet.  Malnutrition.  Problems with absorbing magnesium from the intestines.  Dehydration.  Alcohol abuse.  Vomiting.  Severe diarrhea.  Some medicines, including medicines that make you urinate more.  Certain diseases, such as kidney disease, diabetes, and overactive thyroid. What are the signs or symptoms?  Involuntary shaking or trembling of a body part (tremor).  Confusion.  Muscle weakness.  Sensitivity to light, sound, and touch.  Psychiatric issues, such as depression, irritability, or psychosis.  Sudden tightening of muscles (muscle spasms).  Tingling in the arms and legs.  A feeling of fluttering of the heart. These symptoms are more severe if magnesium levels drop suddenly. How is this diagnosed? To make a diagnosis, your health care provider will do a physical exam and order blood and urine tests. How is this treated? Treatment will depend on the cause and the severity of your condition. It may involve:  A magnesium supplement. This can be taken in pill form. It can also be given through an IV tube. This is usually done if the condition is severe.  Changes to your diet. You may be directed to eat foods that have a lot of magnesium, such as green leafy vegetables, peas, beans, and nuts.  Eliminating alcohol from your diet. Follow these instructions at home:  Include foods with magnesium in your diet. Foods that are rich in magnesium include green vegetables, beans, nuts and seeds, and whole grains.  Take medicines only as directed by your health care provider.  Take  magnesium supplements if your health care provider instructs you to do that. Take them as directed.  Have your magnesium levels monitored as directed by your health care provider.  When you are active, drink fluids that contain electrolytes.  Keep all follow-up visits as directed by your health care provider. This is important. Contact a health care provider if:  You get worse instead of better.  Your symptoms return. Get help right away if:  Your symptoms are severe. This information is not intended to replace advice given to you by your health care provider. Make sure you discuss any questions you have with your health care provider. Document Released: 07/10/2005 Document Revised: 03/21/2016 Document Reviewed: 05/30/2014 Elsevier Interactive Patient Education  2017 [employer]  

## 2016-12-26 NOTE — Progress Notes (Signed)
[facility]  [phone] [phone]  Clinic follow up Note   Patient Care Team: [name], MD as PCP - General (Specialist) [name], CRNP as Nurse Practitioner (Nurse Practitioner) Truitt Merle, MD as Consulting Physician (Hematology) Ladene Artist, MD as Consulting Physician (Gastroenterology) 12/30/2016  CHIEF COMPLAINTS Follow up metastatic sigmoid colon cancer  Oncology History   He is a Metastatic colon cancer to liver   Staging form: Colon and Rectum, AJCC 7th Edition     Clinical: Stage Unknown ([place], NX, M1) - Unsigned      Cancer of sigmoid colon ([place])   10/28/2014 Tumor Marker    AFP 3.2 CEA > 10,000 CA 19-9 12,929.6. tumor (-) KRAS and NRAS mutation.       11/10/2014 Imaging    PET scan showed hypermetabolic mass in the sigmoid colon was noted metastasis in the retroperitoneum. Probable left adrenal and pulmonary metastasis, and diffuse liver metastasis.      11/21/2014 Initial Diagnosis    Metastatic colon cancer to liver, lung, abd nodes and left adrenal gland. Diagnosis was made by liver biopsy.       11/30/2014 - 05/23/2015 Chemotherapy    First line chemo [id number], Panitumumab added from second cycle, chemo held due to recurrent GI bleeding        12/28/2014 - 12/31/2014 Hospital Admission    Was admitted for dehydration, neutropenia fever with UTI, and severe skin rash.      02/08/2015 - 02/12/2015 Hospital Admission    He was admitted for upper GI bleeding, e.g. showed a gastric ulcer with clots, status post appendectomy injection. He also received a blood transfusion.      03/01/2015 Tumor Marker    CEA 694, CA19.9 462      03/13/2015 Imaging    CT CAP showed partial response, no new lesions.       05/31/2015 - 06/02/2015 Hospital Admission    He was admitted to [facility] in [place] due to upper GI bleeding, EGD showed gastric and dudenal ulcers       06/27/2015 - 06/29/2015 Hospital Admission    he was admitted  for dairrhea and pancolitis, c-diff and stool cultures were negative, treated with antibiotics      07/12/2015 -  Chemotherapy    panitumumab 76m/kg, every 2 weeks      09/19/2015 -  Chemotherapy    Irinotecan 181mm2 every 2 weeks, dose reduction due to diarrhea. 5-Fu added on 06/10/2016 due to slight disease progression       11/08/2015 - 11/10/2015 Hospital Admission    Pt was admitted for sepsis and hypotension, was found to have (+) influenza A and treated.       11/30/2015 Imaging     CT scans reviewed continued improvement in the liver metastasis, no other new lesions.      05/06/2016 Tumor Marker    CEA has gradually increased from 110 in March 2017 to 566 in July 2017      06/04/2016 Imaging    Restaging CT scan showed similar to mild increase in hepatic metastasis, mild enlargement of right adrenal nodule, primary similar pulmonary nodules. A left upper lobe nodule has resolved.      11/12/2016 Imaging    CT CAP - Restaging IMPRESSION: 1. No significant change in the appearance of multifocal pulmonary nodules. 2. Previously referenced partially calcified liver metastases are stable from previous exam. Within the posterior right lobe of liver there is a metastasis which appears  slightly increased in size from previous exam. 3. Mild decrease in size of right adrenal nodule. 4. There is mild wall thickening and inflammation involving the descending colon and proximal sigmoid colon worrisome for colitis. A small intramural fluid collection is identified which is unchanged from 08/19/2016, significance unknown. 5. Aortic atherosclerosis.        HISTORY OF PRESENTING ILLNESS:  Jason Reilly 71 y.o. male is here because of abnormal CT findings, which is very suspicious for malignancy. He is on ranitidine from [place], has been on in the [place] for 16 years. He came in with his son and an interpreter.  He has been feeling fatigued since two month ago. He is still able to do all  ADLs. He otherwise denies any pain, bloating or nausea.  He lost about 20lbs in 3 month. His appetite is lower than before, eats less, no change of his bowl habits.  She denied any hematochezia or melana. Per his son, he has had some personality changes daily, irritable, slightly confused some time.  He was evaluated by his primary care physician. Lab test reviewed hepatitis B infection, which he did not know before, and elevated alkaline phosphatase, his liver function and the rest of the liver function was not remarkable. [place] of abdomen was obtained on 07/22/2014, which showed diffusely abnormal liver with multiple echogenic lesions. CT of abdomen with and without contrast was done on 08/26/2014, which reviewed here at a medically with multiple large partially calcified hepatic masses consistent with metastatic disease. Mild retroperitoneal adenopathy with the largest node measuring 1.6 cm. And nonspecific 1.4 cm left adrenal nodule was also noticed. His tumor marker showed CEA greater than 10,000, CA 19-9 12,929, AFP 3.2 (normal). He was referred to [facility] [place] [facility] liver clinic and was evaluated by nurse practitioner [name]. Treatment for hepatitis B was not recommended based on his virus load.  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 He was evaluated by a cardiologist in 2014. He denies any significant dyspnea on exertion. No leg swollen.  CURRENT THERAPY:  1. panitumumab 52m/kg, every 2 weeks, started on 07/12/2015 2. Irinotecan 1858mm2 every 2 weeks added on 09/19/2015, dose decreased to 16081m2 from cycle 2, and further decreased to 125m31m from cycle 10 due to diarrhea and anorexia, changed to 140mg50mfrom cycle 13. 3. 5-FU 1800mg/45mver 46 hours added back from 06/10/2016 due to mild disease progression 4. He also receives IV magnesium 6g weekly   INTERIM HISTORY: Jason Reilly for follow-up and chemotherapy. He is accompanied by  his son and a translOptometrist. He had questions pertaining to not having a refill on Folic Acid. The patient used to drink alcohol, but not anymore. The patient reports he is good, but has been fatigued for the past 2-3 weeks after chemotherapy. He wants to delay treatment today. Reports walking for exercising. He and his [place] go [place]. Denies nausea and diarrhea after chemotherapy. The patient has lost some weight. He denies bleeding.  MEDICAL HISTORY:  Past Medical History:  Diagnosis Date  . Abnormal EKG   . Diabetes mellitus without complication (HCC)    metformin  . Gastric ulcer   . H. pylori infection   . Hepatitis B   . Hypertension   . Metastatic colon cancer to liver (HCC)  [place]H[facility]  [place]     SURGICAL HISTORY: Past Surgical History:  Procedure Laterality Date  .  ESOPHAGOGASTRODUODENOSCOPY N/A 02/08/2015   Procedure: ESOPHAGOGASTRODUODENOSCOPY (EGD);  Surgeon: Ladene Artist, MD;  Location: [facility];  Service: Endoscopy;  Laterality: N/A;  . ESOPHAGOGASTRODUODENOSCOPY (EGD) WITH PROPOFOL N/A 08/15/2015   Procedure: ESOPHAGOGASTRODUODENOSCOPY (EGD) WITH PROPOFOL;  Surgeon: Ladene Artist, MD;  Location: [facility];  Service: Endoscopy;  Laterality: N/A;  . LEFT AND RIGHT HEART CATHETERIZATION WITH CORONARY ANGIOGRAM N/A 03/29/2013   Procedure: LEFT AND RIGHT HEART CATHETERIZATION WITH CORONARY ANGIOGRAM;  Surgeon: Pixie Casino, MD;  Location: [facility] CATH LAB;  Service: Cardiovascular;  Laterality: N/A;  . Nuclear Stress Test  03/03/2013   High risk - consistent with nonischemic cardiomyopathy    SOCIAL HISTORY: Social History   Social History  . Marital status: Married    Spouse name: N/A  . Number of children: N/A  . Years of education: N/A   Occupational History  . Not on file.   Social History Main Topics  . Smoking status: Former Smoker    Types: Cigarettes    Quit date:  11/07/2008  . Smokeless tobacco: Never Used  . Alcohol use Yes     Comment: occasional  . Drug use: No  . Sexual activity: No   Other Topics Concern  . Not on file   Social History Narrative   Married   Enjoys walking   Has lived in [place] > 16 years    FAMILY HISTORY: No family history of liver disease or malignancy.  ALLERGIES:  has No Known Allergies.  MEDICATIONS:  Current Outpatient Prescriptions on File Prior to Visit  Medication Sig Dispense Refill  . clindamycin (CLINDAGEL) 1 % gel Apply topically 2 (two) times daily. 30 g 2  . diphenoxylate-atropine (LOMOTIL) 2.5-0.025 MG tablet Take 1-2 tablets by mouth 4 (four) times daily as needed for diarrhea or loose stools. 30 tablet 0  . hydrocortisone 1 % ointment Apply 1 application topically 2 (two) times daily. 56 g 0  . lidocaine-prilocaine (EMLA) cream Apply 1 application topically as needed. Apply to [facility] cath at least one and a half hour to two hours  before needle stick as needed. 30 g 2  . lisinopril-hydrochlorothiazide (PRINZIDE,ZESTORETIC) 10-12.5 MG tablet     . loperamide (IMODIUM) 2 MG capsule Take 2 capsules (4 mg total) by mouth 4 (four) times daily as needed for diarrhea or loose stools (NO MORE THAN 8 TABLETS PER DAY.). 60 capsule 3  . magic mouthwash SOLN Take 5 mLs by mouth 4 (four) times daily. 120 mL 1  . magnesium oxide (MAG-OX) 400 (241.3 Mg) MG tablet Take 1 tablet (400 mg total) by mouth 3 (three) times daily. 90 tablet 3  . metFORMIN (GLUCOPHAGE) 500 MG tablet Take 1 tablet (500 mg total) by mouth 2 (two) times daily with a meal. 60 tablet 0  . nadolol (CORGARD) 20 MG tablet Take 1 tablet (20 mg total) by mouth 2 (two) times daily. THIS IS A ONE TIME ORDER.  FUTURE REFILLS NEED TO BE DONE BY PRIMARY MD. 60 tablet 1  . ondansetron (ZOFRAN) 8 MG tablet Take 1 tablet (8 mg total) by mouth every 8 (eight) hours as needed for nausea or vomiting. 45 tablet 2  . pantoprazole (PROTONIX) 40 MG tablet Take 1 tablet  (40 mg total) by mouth daily. 30 tablet 2  . potassium chloride SA (K-DUR,KLOR-CON) 20 MEQ tablet Take 1 tablet (20 mEq total) by mouth daily. 14 tablet 0  . folic acid (FOLVITE) 1 MG tablet Take 1 tablet (1 mg total) by  mouth daily. (Patient not taking: Reported on 12/30/2016) 30 tablet 3   Current Facility-Administered Medications on File Prior to Visit  Medication Dose Route Frequency Provider Last Rate Last Dose  . 0.9 %  sodium chloride infusion   Intravenous Once Truitt Merle, MD      . ferumoxytol [facility]) 510 mg in sodium chloride 0.9 % 100 mL IVPB  510 mg Intravenous Once Truitt Merle, MD      . sodium chloride 0.9 % injection 10 mL  10 mL Intracatheter PRN Truitt Merle, MD   10 mL at 10/03/15 1705  . sodium chloride 0.9 % injection 10 mL  10 mL Intravenous PRN Truitt Merle, MD   10 mL at 05/14/16 1127  ;   REVIEW OF SYSTEMS:   Constitutional: Denies fevers, chills or abnormal night sweats, (+) weight loss (+) fatigue Eyes: Denies blurriness of vision, double vision or watery eyes Ears, nose, mouth, throat, and face: Denies mucositis or sore throat Respiratory: Denies cough, dyspnea or wheezes Cardiovascular: Denies palpitation, chest discomfort or lower extremity swelling Gastrointestinal: Denies nausea, heartburn or change in bowel habits Skin: (+) Skin rash Lymphatics: Denies new lymphadenopathy or easy bruising Neurological:Denies numbness, tingling or new weaknesses Behavioral/Psych: Mood is stable, no new changes, (+) insomnia All other systems were reviewed with the patient and are negative.  PHYSICAL EXAMINATION: BP 105/64 (BP Location: Left Arm, Patient Position: Sitting)   Pulse 70   Temp 97.9 F (36.6 C) (Oral)   Resp 18   Ht '5\' 5"'  (1.651 m)   Wt 138 lb 8 oz (62.8 kg)   SpO2 96%   BMI 23.05 kg/m   ECOG PERFORMANCE STATUS: 1 GENERAL:alert, no distress and comfortable SKIN: (+) Dry skin,  no skin ulcer, (+) scatter skin rashes on his neck and upper chest, some are resolving  with skin pigmentation. No discharge. EYES: normal, conjunctiva are pink and non-injected, sclera clear OROPHARYNX:no exudate, no erythema and lips, buccal mucosa, and tongue with mild discoloration at the tip. The low front teeth are missing  NECK: supple, thyroid normal size, non-tender, without nodularity LYMPH:  no palpable lymphadenopathy in the cervical, axillary or inguinal LUNGS: clear to auscultation and percussion with normal breathing effort, no rales   HEART: regular rate & rhythm and no murmurs and no lower extremity edema ABDOMEN:abdomen soft, non-tender, no hepatomegaly, no splenomegaly and normal bowel sounds Musculoskeletal:no cyanosis of digits and no clubbing  PSYCH: alert & oriented x 3 with fluent speech NEURO: no focal motor/sensory deficits   LABORATORY DATA:  I have reviewed the data as listed CBC Latest Ref Rng & Units 12/30/2016 12/16/2016 12/02/2016  WBC 4.0 - 10.3 10e3/uL 5.1 4.2 5.4  Hemoglobin 13.0 - 17.1 g/dL 8.9(L) 9.0(L) 10.0(L)  Hematocrit 38.4 - 49.9 % 27.6(L) 27.6(L) 30.8(L)  Platelets 140 - 400 10e3/uL 183 117(L) 183    CMP Latest Ref Rng & Units 12/30/2016 12/16/2016 12/02/2016  Glucose 70 - 140 mg/dl 112 98 267(H)  BUN 7.0 - 26.0 mg/dL 18.6 6.9(L) 9.7  Creatinine 0.7 - 1.3 mg/dL 0.8 0.7 0.8  Sodium 136 - 145 mEq/L 141 142 139  Potassium 3.5 - 5.1 mEq/L 4.3 3.4(L) 3.9  Chloride 101 - 111 mmol/L - - -  CO2 22 - 29 mEq/L '24 23 25  ' Calcium 8.4 - 10.4 mg/dL 8.7 7.7(L) 7.9(L)  Total Protein 6.4 - 8.3 g/dL 7.3 6.7 6.9  Total Bilirubin 0.20 - 1.20 mg/dL 0.46 0.46 0.65  Alkaline Phos 40 - 150 U/L 152(H) 179(H)  200(H)  AST 5 - 34 U/L 26 33 30  ALT 0 - 55 U/L 22 23 32    INITIAL tumor markers AFP 3.2 CEA > 10,000 CA 19-9 12,929.6  CEA 01/11/2015: 1809 07/05/2015: 386 10/03/2015: 66.4 02/19/2016: 155.8 05/06/2016: 566 07/08/2016: 834 08/05/2016: 1102 10/07/2016: 1733 11/06/2016: 2214.95 12/16/2016: 2858.90  PATHOLOGY Liver, needle/core biopsy 11/21/2014 -  METASTATIC ADENOCARCINOMA, SEE COMMENT. Microscopic Comment The adenocarcinoma demonstrates the following immunophenotype: Cytokeratin 7 - negative expression. Cytokeratin 20 - strong diffuse expression. CD2 - strong diffuse expression. Overall the morphology and immunophenotype are that of metastatic adenocarcinoma primary to colorectum. The recent nuclear medicine scan demonstrating sigmoid mass with associated liver masses is noted.  [facility] test result:    RADIOGRAPHIC STUDIES: I have personally reviewed the outside CT scan image with patient and his son.   CT CAP W CONTRAST 11/12/2016 IMPRESSION: 1. No significant change in the appearance of multifocal pulmonary nodules. 2. Previously referenced partially calcified liver metastases are stable from previous exam. Within the posterior right lobe of liver there is a metastasis which appears slightly increased in size from previous exam. 3. Mild decrease in size of right adrenal nodule. 4. There is mild wall thickening and inflammation involving the descending colon and proximal sigmoid colon worrisome for colitis. A small intramural fluid collection is identified which is unchanged from 08/19/2016, significance unknown. 5. Aortic atherosclerosis.  CT chest, abdomen and pelvis with IV contrast on  08/19/2016  IMPRESSION: 1. Respiratory motion degrades image quality in the lungs. Favor slight enlargement of a right lower lobe pulmonary nodule as well as development of a new pulmonary nodule in the left upper lobe. 2. Hepatic metastatic disease is grossly stable. Associated marginal irregularity of the liver is likely due to hepatic fibrosis from treated metastatic disease, with evidence of portal hypertension. 3. Right adrenal nodule is stable to minimally enlarged. 4. Aortic atherosclerosis (ICD10-170.0). Coronary artery Calcification.  ASSESSMENT & PLAN:  71 y.o. [place] male, with past history of hypertension and  dilated nonischemic gammopathy with EF 25%, no clinical signs of heart failure, who was found to have hepatitis B infection lately, and multiple liver lesions on the CT scan. He has extremely high CEA and CA 19-9 levels. PET scan reviewed a hypermetabolic sigmoid colon mass, diffuse liver metastasis, probable lung and adrenal gland metastasis.  1. Metastatic sigmoid colon cancer, with diffuse liver, lungs, node and left adrenal gland metastases. KRAS/NRAS wild type, MSI-stable -Liver biopsy showed metastatic adenocarcinoma. His tumor were strongly positive for CK20 and CD2, consistent with primary colorectal primary. KRAS and NRAS mutations were not detected.  -Pt understands that this is incurable cancer, and he has very high disease burden and overall prognosis is poor. The treatment goal is palliative -I previously discussed his restaging CT from 08/19/2016 which showed overall stable disease in liver, a few small lung nodules, slightly bigger, will continue monitoring. - his previous scan showed overall some improvement since we restarted on 5-FU in August 2017. -His tumor marker CEA has been trending up, concerning for slow disease progression, we'll continue monitoring -I previously reviewed his restaging CT scan from 11/12/2016, which showed overall stable disease, except one lesion in the liver got slightly bigger. He is clinically doing well, I'll continue his current chemotherapy -I'll repeat his staging scan on April 9 or 10, for close follow-up. He may need change in chemotherapy treatment in the near future if he has disease progression. His CEA has been trending up, concerning for disease progression  -  Lab reviewed, but the patient reports feeling fatigued to the point he wants to postpone treatment today. Magnesium today, chemotherapy next week.   2. Grade 1-2 skin rashes, stable  -Secondary to panitumumab, stable overall  -continue hydrocortisone 2.5%, and clindamycin gel 1%  twice daily  as needed  -He knows to avoid sun exposure, and call me if it gets worse.  3. Gastric ulcer with significant GI bleeding in April and Aug 2016 -He is on PPI, continue once daily  -Repeat his EGD on 08/15/2015 showed near complete healing of his gastric ulcer -continue Nadolol 90m bid, per Dr. SFuller Plan- I encourage him to follow-up [facility] has restarted 5-fu   4. Type 2 DM  -His glucose level has increased lately, it was 267 on 12/02/16. I encouraged him to watch his diet, avoid any sweets, and monitor closely at home -I encouraged him to follow-up with his primary care physician Dr. WJimmye [place] He is in the process to change his primary care physician. -I previously recommend him to increase metformin to 1000 mg twice daily, to better control his hyperglycemia.  5. HTN, Dilated nonischemic ischemia cardiomyopathy with EF 25% -He is clinically doing well without symptoms of CHF. However this is probably going to impact his chemotherapy.Will try to avoid cardiotoxic chemotherapy agent and avoid fluid overload during chemotherapy. -Continue follow-up with cardiology. -will hold on his lisinopril-HCTZ for now, his BP has been normal lately   6 Hepatitis B carrier, with mild portal hypertension  -Per liver clinic, no need for treatment. Follow-up with Dr. SSilvio Pate  7. Malnutrition -I encouraged him to eat more, and take supplements as needed. -improved, gained weight back after I reduce his chemotherapy dose  -follow up with Dietitian   8. Anemia secondary to GI bleeding, iron deficiency and chemo  -Repeat lab on 06/06/2015 showed ferritin 117, serum iron 24, saturation 8%, which supports iron deficiency -He received IV Feraheme again in Aug 2016 after GI bleeding  -Repeat a ferritin was 273 on 10/03/2015, much improved  -he received iv feraheme again on 11/27/2015 and 12/04/2015, but anemia did not improve much. -His anemia has been slightly worse  lately, probably related to chemotherapy, we'll close monitor any signs of GI bleeding. -Blood transfusion if hemoglobin less than 8 or symptomatic anemia with hemoglobin 8-9 -We discussed that the patient does not need a refill on his Folic Acid since he is not drinking alcohol anymore.  9. hypomagnesemia  -secondary to panitumumab -he receives IV mag 6 g every week  -He is taking magnesium pill 2 tablets 3 times a day -continue monitoring    Plan -Lab reviewed. The patient is fatigued we'll postpone chemo FOLFIRI and panitumumab to next week  -Due to his vacation schedule, we'll postpone his next cycle chemotherapy to April 10 -I'll repeat his staging scan in April 9 or 10 for close follow-up. -f[place] [facility] -We discussed that the patient does not need a refill of Folic Acid since he is not drinking alcohol anymore.  All questions were answered. The patient knows to call the clinic with any problems, questions or concerns.  I spent 25 minutes counseling the patient face to face. The total time spent in the appointment was 30 minutes and more than 50% was on counseling.   FTruitt Merle MD 12/30/16     This document serves as a record of services personally performed by YTruitt Merle MD.  It was created on her behalf by Darcus Austin, a trained medical scribe. The creation of this record is based on the scribe's personal observations and the provider's statements to them. This document has been checked and approved by the attending provider.

## 2016-12-30 ENCOUNTER — Other Ambulatory Visit: Payer: Self-pay | Admitting: *Deleted

## 2016-12-30 ENCOUNTER — Telehealth: Payer: Self-pay | Admitting: Hematology

## 2016-12-30 ENCOUNTER — Ambulatory Visit: Payer: Medicare Other

## 2016-12-30 ENCOUNTER — Encounter: Payer: Self-pay | Admitting: Hematology

## 2016-12-30 ENCOUNTER — Other Ambulatory Visit (HOSPITAL_BASED_OUTPATIENT_CLINIC_OR_DEPARTMENT_OTHER): Payer: Medicare Other

## 2016-12-30 ENCOUNTER — Ambulatory Visit (HOSPITAL_BASED_OUTPATIENT_CLINIC_OR_DEPARTMENT_OTHER): Payer: Medicare Other | Admitting: Hematology

## 2016-12-30 ENCOUNTER — Ambulatory Visit (HOSPITAL_BASED_OUTPATIENT_CLINIC_OR_DEPARTMENT_OTHER): Payer: Medicare Other

## 2016-12-30 VITALS — BP 105/64 | HR 70 | Temp 97.9°F | Resp 18 | Ht 65.0 in | Wt 138.5 lb

## 2016-12-30 DIAGNOSIS — D6481 Anemia due to antineoplastic chemotherapy: Secondary | ICD-10-CM | POA: Diagnosis not present

## 2016-12-30 DIAGNOSIS — C7972 Secondary malignant neoplasm of left adrenal gland: Secondary | ICD-10-CM | POA: Diagnosis not present

## 2016-12-30 DIAGNOSIS — C187 Malignant neoplasm of sigmoid colon: Secondary | ICD-10-CM

## 2016-12-30 DIAGNOSIS — C772 Secondary and unspecified malignant neoplasm of intra-abdominal lymph nodes: Secondary | ICD-10-CM

## 2016-12-30 DIAGNOSIS — C787 Secondary malignant neoplasm of liver and intrahepatic bile duct: Principal | ICD-10-CM

## 2016-12-30 DIAGNOSIS — I10 Essential (primary) hypertension: Secondary | ICD-10-CM | POA: Diagnosis not present

## 2016-12-30 DIAGNOSIS — E46 Unspecified protein-calorie malnutrition: Secondary | ICD-10-CM | POA: Diagnosis not present

## 2016-12-30 DIAGNOSIS — C189 Malignant neoplasm of colon, unspecified: Secondary | ICD-10-CM

## 2016-12-30 DIAGNOSIS — Z95828 Presence of other vascular implants and grafts: Secondary | ICD-10-CM

## 2016-12-30 DIAGNOSIS — C7800 Secondary malignant neoplasm of unspecified lung: Secondary | ICD-10-CM

## 2016-12-30 DIAGNOSIS — E119 Type 2 diabetes mellitus without complications: Secondary | ICD-10-CM

## 2016-12-30 DIAGNOSIS — R21 Rash and other nonspecific skin eruption: Secondary | ICD-10-CM

## 2016-12-30 DIAGNOSIS — D509 Iron deficiency anemia, unspecified: Secondary | ICD-10-CM

## 2016-12-30 DIAGNOSIS — I5042 Chronic combined systolic (congestive) and diastolic (congestive) heart failure: Secondary | ICD-10-CM

## 2016-12-30 LAB — COMPREHENSIVE METABOLIC PANEL
ALBUMIN: 3.5 g/dL (ref 3.5–5.0)
ALK PHOS: 152 U/L — AB (ref 40–150)
ALT: 22 U/L (ref 0–55)
ANION GAP: 10 meq/L (ref 3–11)
AST: 26 U/L (ref 5–34)
BILIRUBIN TOTAL: 0.46 mg/dL (ref 0.20–1.20)
BUN: 18.6 mg/dL (ref 7.0–26.0)
CALCIUM: 8.7 mg/dL (ref 8.4–10.4)
CO2: 24 mEq/L (ref 22–29)
Chloride: 107 mEq/L (ref 98–109)
Creatinine: 0.8 mg/dL (ref 0.7–1.3)
EGFR: >90 mL/min/{1.73_m2} (ref >90)
Glucose: 112 mg/dl (ref 70–140)
Potassium: 4.3 mEq/L (ref 3.5–5.1)
Sodium: 141 mEq/L (ref 136–145)
TOTAL PROTEIN: 7.3 g/dL (ref 6.4–8.3)

## 2016-12-30 LAB — CBC WITH DIFFERENTIAL/PLATELET
BASO%: 0.6 % (ref 0.0–2.0)
BASOS ABS: 0 10*3/uL (ref 0.0–0.1)
EOS ABS: 0.2 10*3/uL (ref 0.0–0.5)
EOS%: 3.4 % (ref 0.0–7.0)
HEMATOCRIT: 27.6 % — AB (ref 38.4–49.9)
HGB: 8.9 g/dL — ABNORMAL LOW (ref 13.0–17.1)
LYMPH%: 15 % (ref 14.0–49.0)
MCH: 27.2 pg (ref 27.2–33.4)
MCHC: 32.2 g/dL (ref 32.0–36.0)
MCV: 84.4 fL (ref 79.3–98.0)
MONO#: 0.6 10*3/uL (ref 0.1–0.9)
MONO%: 10.8 % (ref 0.0–14.0)
NEUT%: 70.2 % (ref 39.0–75.0)
NEUTROS ABS: 3.6 10*3/uL (ref 1.5–6.5)
NRBC: 0 % (ref 0–0)
PLATELETS: 183 10*3/uL (ref 140–400)
RBC: 3.27 10*6/uL — ABNORMAL LOW (ref 4.20–5.82)
RDW: 18.5 % — AB (ref 11.0–14.6)
WBC: 5.1 10*3/uL (ref 4.0–10.3)
lymph#: 0.8 10*3/uL — ABNORMAL LOW (ref 0.9–3.3)

## 2016-12-30 LAB — MAGNESIUM: Magnesium: 0.8 mg/dl — CL (ref 1.5–2.5)

## 2016-12-30 MED ORDER — HEPARIN SOD (PORK) LOCK FLUSH 100 UNIT/ML IV SOLN
500.0000 [IU] | Freq: Once | INTRAVENOUS | Status: DC | PRN
  Filled 2016-12-30: qty 5

## 2016-12-30 MED ORDER — SODIUM CHLORIDE 0.9 % IJ SOLN
10.0000 mL | INTRAMUSCULAR | Status: DC | PRN
  Administered 2016-12-30: 10 mL via INTRAVENOUS
  Filled 2016-12-30: qty 10

## 2016-12-30 MED ORDER — PROCHLORPERAZINE MALEATE 10 MG PO TABS: 10.0000 mg | ORAL_TABLET | Freq: Four times a day (QID) | ORAL | 3 refills | Status: DC | PRN

## 2016-12-30 MED ORDER — MAGNESIUM SULFATE 50 % IJ SOLN
Freq: Once | INTRAMUSCULAR | Status: AC
  Administered 2016-12-30: 14:00:00 via INTRAVENOUS
  Filled 2016-12-30: qty 250

## 2016-12-30 NOTE — Telephone Encounter (Signed)
Patient came to scheduling to pick up AVS report and calendars with future scheduled appointments.

## 2016-12-30 NOTE — Patient Instructions (Signed)
Hypomagnesemia Hypomagnesemia is a condition in which the level of magnesium in the blood is low. Magnesium is a mineral that is found in many foods. It is used in many different processes in the body. Hypomagnesemia can affect every organ in the body. It can cause life-threatening problems. What are the causes? Causes of hypomagnesemia include:  Not getting enough magnesium in your diet.  Malnutrition.  Problems with absorbing magnesium from the intestines.  Dehydration.  Alcohol abuse.  Vomiting.  Severe diarrhea.  Some medicines, including medicines that make you urinate more.  Certain diseases, such as kidney disease, diabetes, and overactive thyroid. What are the signs or symptoms?  Involuntary shaking or trembling of a body part (tremor).  Confusion.  Muscle weakness.  Sensitivity to light, sound, and touch.  Psychiatric issues, such as depression, irritability, or psychosis.  Sudden tightening of muscles (muscle spasms).  Tingling in the arms and legs.  A feeling of fluttering of the heart. These symptoms are more severe if magnesium levels drop suddenly. How is this diagnosed? To make a diagnosis, your health care provider will do a physical exam and order blood and urine tests. How is this treated? Treatment will depend on the cause and the severity of your condition. It may involve:  A magnesium supplement. This can be taken in pill form. It can also be given through an IV tube. This is usually done if the condition is severe.  Changes to your diet. You may be directed to eat foods that have a lot of magnesium, such as green leafy vegetables, peas, beans, and nuts.  Eliminating alcohol from your diet. Follow these instructions at home:  Include foods with magnesium in your diet. Foods that are rich in magnesium include green vegetables, beans, nuts and seeds, and whole grains.  Take medicines only as directed by your health care provider.  Take  magnesium supplements if your health care provider instructs you to do that. Take them as directed.  Have your magnesium levels monitored as directed by your health care provider.  When you are active, drink fluids that contain electrolytes.  Keep all follow-up visits as directed by your health care provider. This is important. Contact a health care provider if:  You get worse instead of better.  Your symptoms return. Get help right away if:  Your symptoms are severe. This information is not intended to replace advice given to you by your health care provider. Make sure you discuss any questions you have with your health care provider. Document Released: 07/10/2005 Document Revised: 03/21/2016 Document Reviewed: 05/30/2014 Elsevier Interactive Patient Education  2017 [employer]  

## 2016-12-30 NOTE — Telephone Encounter (Signed)
Appointments scheduled per 3/5 LOS. Patient will pick up print outs in infusion room.

## 2017-01-01 ENCOUNTER — Ambulatory Visit: Payer: Medicare Other

## 2017-01-06 ENCOUNTER — Ambulatory Visit (HOSPITAL_BASED_OUTPATIENT_CLINIC_OR_DEPARTMENT_OTHER): Payer: Medicare Other

## 2017-01-06 ENCOUNTER — Other Ambulatory Visit (HOSPITAL_BASED_OUTPATIENT_CLINIC_OR_DEPARTMENT_OTHER): Payer: Medicare Other

## 2017-01-06 VITALS — BP 101/66 | HR 73 | Temp 98.9°F | Resp 17

## 2017-01-06 DIAGNOSIS — Z5111 Encounter for antineoplastic chemotherapy: Secondary | ICD-10-CM | POA: Diagnosis not present

## 2017-01-06 DIAGNOSIS — C187 Malignant neoplasm of sigmoid colon: Secondary | ICD-10-CM | POA: Diagnosis not present

## 2017-01-06 DIAGNOSIS — Z5112 Encounter for antineoplastic immunotherapy: Secondary | ICD-10-CM

## 2017-01-06 DIAGNOSIS — Z452 Encounter for adjustment and management of vascular access device: Secondary | ICD-10-CM | POA: Diagnosis not present

## 2017-01-06 DIAGNOSIS — Z95828 Presence of other vascular implants and grafts: Secondary | ICD-10-CM

## 2017-01-06 LAB — CBC WITH DIFFERENTIAL/PLATELET
BASO%: 0.5 % (ref 0.0–2.0)
Basophils Absolute: 0 10*3/uL (ref 0.0–0.1)
EOS%: 3.7 % (ref 0.0–7.0)
Eosinophils Absolute: 0.2 10*3/uL (ref 0.0–0.5)
HCT: 27.4 % — ABNORMAL LOW (ref 38.4–49.9)
HEMOGLOBIN: 8.9 g/dL — AB (ref 13.0–17.1)
LYMPH%: 15.8 % (ref 14.0–49.0)
MCH: 27.8 pg (ref 27.2–33.4)
MCHC: 32.5 g/dL (ref 32.0–36.0)
MCV: 85.4 fL (ref 79.3–98.0)
MONO#: 0.8 10*3/uL (ref 0.1–0.9)
MONO%: 16.3 % — AB (ref 0.0–14.0)
NEUT%: 63.7 % (ref 39.0–75.0)
NEUTROS ABS: 3.3 10*3/uL (ref 1.5–6.5)
Platelets: 161 10*3/uL (ref 140–400)
RBC: 3.2 10*6/uL — ABNORMAL LOW (ref 4.20–5.82)
RDW: 20.1 % — ABNORMAL HIGH (ref 11.0–14.6)
WBC: 5.2 10*3/uL (ref 4.0–10.3)
lymph#: 0.8 10*3/uL — ABNORMAL LOW (ref 0.9–3.3)

## 2017-01-06 LAB — MAGNESIUM: MAGNESIUM: 0.8 mg/dL — AB (ref 1.5–2.5)

## 2017-01-06 LAB — COMPREHENSIVE METABOLIC PANEL
ALT: 20 U/L (ref 0–55)
AST: 32 U/L (ref 5–34)
Albumin: 3.4 g/dL — ABNORMAL LOW (ref 3.5–5.0)
Alkaline Phosphatase: 166 U/L — ABNORMAL HIGH (ref 40–150)
Anion Gap: 10 mEq/L (ref 3–11)
BILIRUBIN TOTAL: 0.63 mg/dL (ref 0.20–1.20)
BUN: 22.6 mg/dL (ref 7.0–26.0)
CO2: 25 meq/L (ref 22–29)
Calcium: 9.1 mg/dL (ref 8.4–10.4)
Chloride: 104 mEq/L (ref 98–109)
Creatinine: 1 mg/dL (ref 0.7–1.3)
EGFR: 79 mL/min/{1.73_m2} — ABNORMAL LOW (ref >90)
GLUCOSE: 192 mg/dL — AB (ref 70–140)
Potassium: 4.3 mEq/L (ref 3.5–5.1)
SODIUM: 140 meq/L (ref 136–145)
TOTAL PROTEIN: 7.3 g/dL (ref 6.4–8.3)

## 2017-01-06 LAB — CEA (IN HOUSE-CHCC)

## 2017-01-06 MED ORDER — ATROPINE SULFATE 1 MG/ML IJ SOLN
0.5000 mg | Freq: Once | INTRAMUSCULAR | Status: AC | PRN
  Administered 2017-01-06: 0.5 mg via INTRAVENOUS

## 2017-01-06 MED ORDER — IRINOTECAN HCL CHEMO INJECTION 100 MG/5ML
140.0000 mg/m2 | Freq: Once | INTRAVENOUS | Status: AC
  Administered 2017-01-06: 240 mg via INTRAVENOUS
  Filled 2017-01-06: qty 10

## 2017-01-06 MED ORDER — SODIUM CHLORIDE 0.9 % IV SOLN
Freq: Once | INTRAVENOUS | Status: AC
  Administered 2017-01-06: 13:00:00 via INTRAVENOUS

## 2017-01-06 MED ORDER — ONDANSETRON HCL 8 MG PO TABS
ORAL_TABLET | ORAL | Status: AC
  Filled 2017-01-06: qty 1

## 2017-01-06 MED ORDER — SODIUM CHLORIDE 0.9 % IJ SOLN
10.0000 mL | INTRAMUSCULAR | Status: DC | PRN
  Administered 2017-01-06: 10 mL via INTRAVENOUS
  Filled 2017-01-06: qty 10

## 2017-01-06 MED ORDER — DEXAMETHASONE SODIUM PHOSPHATE 10 MG/ML IJ SOLN
INTRAMUSCULAR | Status: AC
  Filled 2017-01-06: qty 1

## 2017-01-06 MED ORDER — ATROPINE SULFATE 1 MG/ML IJ SOLN
INTRAMUSCULAR | Status: AC
Start: 2017-01-06 — End: 2017-01-06
  Filled 2017-01-06: qty 1

## 2017-01-06 MED ORDER — ONDANSETRON HCL 8 MG PO TABS
8.0000 mg | ORAL_TABLET | Freq: Once | ORAL | Status: AC
  Administered 2017-01-06: 8 mg via ORAL

## 2017-01-06 MED ORDER — PANITUMUMAB CHEMO INJECTION 100 MG/5ML
6.3000 mg/kg | Freq: Once | INTRAVENOUS | Status: AC
  Administered 2017-01-06: 400 mg via INTRAVENOUS
  Filled 2017-01-06: qty 20

## 2017-01-06 MED ORDER — DEXAMETHASONE SODIUM PHOSPHATE 10 MG/ML IJ SOLN
10.0000 mg | Freq: Once | INTRAMUSCULAR | Status: AC
  Administered 2017-01-06: 10 mg via INTRAVENOUS

## 2017-01-06 MED ORDER — SODIUM CHLORIDE 0.9 % IV SOLN: Freq: Once | INTRAVENOUS | Status: DC

## 2017-01-06 MED ORDER — SODIUM CHLORIDE 0.9 % IV SOLN
1800.0000 mg/m2 | INTRAVENOUS | Status: DC
  Administered 2017-01-06: 3050 mg via INTRAVENOUS
  Filled 2017-01-06: qty 61

## 2017-01-06 NOTE — Patient Instructions (Signed)
Implanted Port Home Guide An implanted port is a type of central line that is placed under the skin. Central lines are used to provide IV access when treatment or nutrition needs to be given through a person's veins. Implanted ports are used for long-term IV access. An implanted port may be placed because:  You need IV medicine that would be irritating to the small veins in your hands or arms.  You need long-term IV medicines, such as antibiotics.  You need IV nutrition for a long period.  You need frequent blood draws for lab tests.  You need dialysis.  Implanted ports are usually placed in the chest area, but they can also be placed in the upper arm, the abdomen, or the leg. An implanted port has two main parts:  Reservoir. The reservoir is round and will appear as a small, raised area under your skin. The reservoir is the part where a needle is inserted to give medicines or draw blood.  Catheter. The catheter is a thin, flexible tube that extends from the reservoir. The catheter is placed into a large vein. Medicine that is inserted into the reservoir goes into the catheter and then into the vein.  How will I care for my incision site? Do not get the incision site wet. Bathe or shower as directed by your health care provider. How is my port accessed? Special steps must be taken to access the port:  Before the port is accessed, a numbing cream can be placed on the skin. This helps numb the skin over the port site.  Your health care provider uses a sterile technique to access the port. ? Your health care provider must put on a mask and sterile gloves. ? The skin over your port is cleaned carefully with an antiseptic and allowed to dry. ? The port is gently pinched between sterile gloves, and a needle is inserted into the port.  Only "non-coring" port needles should be used to access the port. Once the port is accessed, a blood return should be checked. This helps ensure that the port  is in the vein and is not clogged.  If your port needs to remain accessed for a constant infusion, a clear (transparent) bandage will be placed over the needle site. The bandage and needle will need to be changed every week, or as directed by your health care provider.  Keep the bandage covering the needle clean and dry. Do not get it wet. Follow your health care provider's instructions on how to take a shower or bath while the port is accessed.  If your port does not need to stay accessed, no bandage is needed over the port.  What is flushing? Flushing helps keep the port from getting clogged. Follow your health care provider's instructions on how and when to flush the port. Ports are usually flushed with saline solution or a medicine called heparin. The need for flushing will depend on how the port is used.  If the port is used for intermittent medicines or blood draws, the port will need to be flushed: ? After medicines have been given. ? After blood has been drawn. ? As part of routine maintenance.  If a constant infusion is running, the port may not need to be flushed.  How long will my port stay implanted? The port can stay in for as long as your health care provider thinks it is needed. When it is time for the port to come out, surgery will be   done to remove it. The procedure is similar to the one performed when the port was put in. When should I seek immediate medical care? When you have an implanted port, you should seek immediate medical care if:  You notice a bad smell coming from the incision site.  You have swelling, redness, or drainage at the incision site.  You have more swelling or pain at the port site or the surrounding area.  You have a fever that is not controlled with medicine.  This information is not intended to replace advice given to you by your health care provider. Make sure you discuss any questions you have with your health care provider. Document  Released: 10/14/2005 Document Revised: 03/21/2016 Document Reviewed: 06/21/2013 Elsevier Interactive Patient Education  2017 [employer]  

## 2017-01-06 NOTE — Progress Notes (Signed)
Per [name], [place] to treat with magnesium 0.8.   Wylene Simmer, BSN, RN 01/06/2017 1:18 PM

## 2017-01-06 NOTE — Patient Instructions (Signed)
[facility] Discharge Instructions for Patients Receiving Chemotherapy  Today you received the following chemotherapy agents: Irinotecan, Vectibix, Adrucil   To help prevent nausea and vomiting after your treatment, we encourage you to take your nausea medication as directed.   If you develop nausea and vomiting that is not controlled by your nausea medication, call the clinic.   BELOW ARE SYMPTOMS THAT SHOULD BE REPORTED IMMEDIATELY:  *FEVER GREATER THAN 100.5 F  *CHILLS WITH OR WITHOUT FEVER  NAUSEA AND VOMITING THAT IS NOT CONTROLLED WITH YOUR NAUSEA MEDICATION  *UNUSUAL SHORTNESS OF BREATH  *UNUSUAL BRUISING OR BLEEDING  TENDERNESS IN MOUTH AND THROAT WITH OR WITHOUT PRESENCE OF ULCERS  *URINARY PROBLEMS  *BOWEL PROBLEMS  UNUSUAL RASH Items with * indicate a potential emergency and should be followed up as soon as possible.  Feel free to call the clinic you have any questions or concerns. The clinic phone number is [phone].  Please show the [facility] at check-in to the Emergency Department and triage nurse.

## 2017-01-08 ENCOUNTER — Encounter (HOSPITAL_BASED_OUTPATIENT_CLINIC_OR_DEPARTMENT_OTHER): Payer: Medicare Other

## 2017-01-08 ENCOUNTER — Ambulatory Visit (HOSPITAL_BASED_OUTPATIENT_CLINIC_OR_DEPARTMENT_OTHER): Payer: Medicare Other

## 2017-01-08 VITALS — BP 126/64 | HR 57 | Temp 97.7°F

## 2017-01-08 DIAGNOSIS — Z5111 Encounter for antineoplastic chemotherapy: Secondary | ICD-10-CM | POA: Diagnosis not present

## 2017-01-08 DIAGNOSIS — C187 Malignant neoplasm of sigmoid colon: Secondary | ICD-10-CM

## 2017-01-08 DIAGNOSIS — D509 Iron deficiency anemia, unspecified: Secondary | ICD-10-CM

## 2017-01-08 DIAGNOSIS — Z95828 Presence of other vascular implants and grafts: Secondary | ICD-10-CM

## 2017-01-08 MED ORDER — HEPARIN SOD (PORK) LOCK FLUSH 100 UNIT/ML IV SOLN
500.0000 [IU] | Freq: Once | INTRAVENOUS | Status: AC | PRN
  Administered 2017-01-08: 500 [IU] via INTRAVENOUS
  Filled 2017-01-08: qty 5

## 2017-01-08 MED ORDER — SODIUM CHLORIDE 0.9 % IV SOLN: 660.0000 mg | INTRAVENOUS | Status: DC

## 2017-01-08 MED ORDER — SODIUM CHLORIDE 0.9 % IV SOLN
660.0000 mg | INTRAVENOUS | Status: AC
  Administered 2017-01-08: 650 mg via INTRAVENOUS
  Filled 2017-01-08: qty 13

## 2017-01-08 MED ORDER — SODIUM CHLORIDE 0.9 % IV SOLN
Freq: Once | INTRAVENOUS | Status: AC
  Administered 2017-01-08: 15:00:00 via INTRAVENOUS
  Filled 2017-01-08: qty 250

## 2017-01-08 MED ORDER — SODIUM CHLORIDE 0.9 % IJ SOLN
10.0000 mL | INTRAMUSCULAR | Status: DC | PRN
  Administered 2017-01-08: 10 mL via INTRAVENOUS
  Filled 2017-01-08: qty 10

## 2017-01-08 MED ORDER — SODIUM CHLORIDE 0.9 % IV SOLN
Freq: Once | INTRAVENOUS | Status: AC
  Administered 2017-01-08: 15:00:00 via INTRAVENOUS

## 2017-01-08 NOTE — Addendum Note (Signed)
Addended by: Margaret Pyle on: 01/08/2017 08:39 AM   Modules accepted: Orders

## 2017-01-08 NOTE — Progress Notes (Unsigned)
Pt arrived to clinic at 0800 with dislodged needle from CADD pump.  Dtr reports that their Mother informed her at 0700 that Jason Reilly's needle had come out "sometime while he was sleeping".  Pt and dtr have CADD in a ziploc back with pump still running.  Education provided on how to turn pump off in the event this happens again.  Dr Burr Medico notified of situation.  Orders received to have pharmacy calculate a dose to reconnect pt until his original scheduled time for disconnect at 1430. Pt and dtr informed that we will reconnect a new chemo bag that will run until 1430 and he is to return at that time for disconnect.

## 2017-01-08 NOTE — Patient Instructions (Signed)
Hypomagnesemia Hypomagnesemia is a condition in which the level of magnesium in the blood is low. Magnesium is a mineral that is found in many foods. It is used in many different processes in the body. Hypomagnesemia can affect every organ in the body. It can cause life-threatening problems. What are the causes? Causes of hypomagnesemia include:  Not getting enough magnesium in your diet.  Malnutrition.  Problems with absorbing magnesium from the intestines.  Dehydration.  Alcohol abuse.  Vomiting.  Severe diarrhea.  Some medicines, including medicines that make you urinate more.  Certain diseases, such as kidney disease, diabetes, and overactive thyroid. What are the signs or symptoms?  Involuntary shaking or trembling of a body part (tremor).  Confusion.  Muscle weakness.  Sensitivity to light, sound, and touch.  Psychiatric issues, such as depression, irritability, or psychosis.  Sudden tightening of muscles (muscle spasms).  Tingling in the arms and legs.  A feeling of fluttering of the heart. These symptoms are more severe if magnesium levels drop suddenly. How is this diagnosed? To make a diagnosis, your health care provider will do a physical exam and order blood and urine tests. How is this treated? Treatment will depend on the cause and the severity of your condition. It may involve:  A magnesium supplement. This can be taken in pill form. It can also be given through an IV tube. This is usually done if the condition is severe.  Changes to your diet. You may be directed to eat foods that have a lot of magnesium, such as green leafy vegetables, peas, beans, and nuts.  Eliminating alcohol from your diet. Follow these instructions at home:  Include foods with magnesium in your diet. Foods that are rich in magnesium include green vegetables, beans, nuts and seeds, and whole grains.  Take medicines only as directed by your health care provider.  Take  magnesium supplements if your health care provider instructs you to do that. Take them as directed.  Have your magnesium levels monitored as directed by your health care provider.  When you are active, drink fluids that contain electrolytes.  Keep all follow-up visits as directed by your health care provider. This is important. Contact a health care provider if:  You get worse instead of better.  Your symptoms return. Get help right away if:  Your symptoms are severe. This information is not intended to replace advice given to you by your health care provider. Make sure you discuss any questions you have with your health care provider. Document Released: 07/10/2005 Document Revised: 03/21/2016 Document Reviewed: 05/30/2014 Elsevier Interactive Patient Education  2017 [employer].

## 2017-01-08 NOTE — Progress Notes (Signed)
1445: per Elmyra Ricks RN per [name], pt to remain on Adrucil pump until either Magnesium complete of pump complete.   Patient's son reports that patient c/o "trouble sleeping the past two nights" and wanted to know if there was anything he could have to help him sleep at night. [name] is currently out of office, message sent to [name] and Meredeth Ide RN aware. Pt and patients son aware that Dr. Ernestina Penna nurse will follow up with them once [name] returns to office, and verbalizes understanding.

## 2017-01-09 ENCOUNTER — Telehealth: Payer: Self-pay | Admitting: *Deleted

## 2017-01-09 NOTE — Telephone Encounter (Signed)
Called pt at home and left message on voice mail re:  Per Dr. Ernestina Penna suggestions, pt can take Melatonin or Benadryl OTC for insomnia.   MD will discuss further about sleeping problem at next office visit.

## 2017-01-20 NOTE — Progress Notes (Signed)
Jason Reilly  [phone] [phone]  Clinic follow up Note   Patient Care Team: [name], MD as PCP - General (Specialist) [name], CRNP as Nurse Practitioner (Nurse Practitioner) Truitt Merle, MD as Consulting Physician (Hematology) Ladene Artist, MD as Consulting Physician (Gastroenterology) 02/03/2017  CHIEF COMPLAINTS Follow up metastatic sigmoid colon cancer  Oncology History   He is a Metastatic colon cancer to liver   Staging form: Colon and Rectum, AJCC 7th Edition     Clinical: Stage Unknown ([place], NX, M1) - Unsigned      Cancer of sigmoid colon ([place])   10/28/2014 Tumor Marker    AFP 3.2 CEA > 10,000 CA 19-9 12,929.6. tumor (-) KRAS and NRAS mutation.       11/10/2014 Imaging    PET scan showed hypermetabolic mass in the sigmoid colon was noted metastasis in the retroperitoneum. Probable left adrenal and pulmonary metastasis, and diffuse liver metastasis.      11/21/2014 Initial Diagnosis    Metastatic colon cancer to liver, lung, abd nodes and left adrenal gland. Diagnosis was made by liver biopsy.       11/30/2014 - 05/23/2015 Chemotherapy    First line chemo [id number], Panitumumab added from second cycle, chemo held due to recurrent GI bleeding        12/28/2014 - 12/31/2014 Hospital Admission    Was admitted for dehydration, neutropenia fever with UTI, and severe skin rash.      02/08/2015 - 02/12/2015 Hospital Admission    He was admitted for upper GI bleeding, e.g. showed a gastric ulcer with clots, status post appendectomy injection. He also received a blood transfusion.      03/01/2015 Tumor Marker    CEA 694, CA19.9 462      03/13/2015 Imaging    CT CAP showed partial response, no new lesions.       05/31/2015 - 06/02/2015 Hospital Admission    He was admitted to [facility] in [place] due to upper GI bleeding, EGD showed gastric and dudenal ulcers       06/27/2015 - 06/29/2015 Hospital Admission    he was admitted  for dairrhea and pancolitis, c-diff and stool cultures were negative, treated with antibiotics      07/12/2015 -  Chemotherapy    panitumumab 35m/kg, every 2 weeks      09/19/2015 -  Chemotherapy    Irinotecan 1820mm2 every 2 weeks, dose reduction due to diarrhea. 5-Fu added on 06/10/2016 due to slight disease progression       11/08/2015 - 11/10/2015 Hospital Admission    Pt was admitted for sepsis and hypotension, was found to have (+) influenza A and treated.       11/30/2015 Imaging     CT scans reviewed continued improvement in the liver metastasis, no other new lesions.      05/06/2016 Tumor Marker    CEA has gradually increased from 110 in March 2017 to 566 in July 2017      06/04/2016 Imaging    Restaging CT scan showed similar to mild increase in hepatic metastasis, mild enlargement of right adrenal nodule, primary similar pulmonary nodules. A left upper lobe nodule has resolved.      11/12/2016 Imaging    CT CAP - Restaging IMPRESSION: 1. No significant change in the appearance of multifocal pulmonary nodules. 2. Previously referenced partially calcified liver metastases are stable from previous exam. Within the posterior right lobe of liver there is a metastasis which appears  slightly increased in size from previous exam. 3. Mild decrease in size of right adrenal nodule. 4. There is mild wall thickening and inflammation involving the descending colon and proximal sigmoid colon worrisome for colitis. A small intramural fluid collection is identified which is unchanged from 08/19/2016, significance unknown. 5. Aortic atherosclerosis.      02/03/2017 Imaging    CT CAP w Contrast Chest Impression: 1. Interval increase in size of pulmonary nodules as well as several new pulmonary nodules consistent with progression of pulmonary metastasis.  Abdomen / Pelvis Impression: 1. Interval increase in size of hepatic metastasis within LEFT and RIGHT hepatic lobe. 2. 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 Enhanced Submucosal collections in the proximal sigmoid colon are concerning for colorectal carcinoma recurrence. Resolution of descending colitis.       HISTORY OF PRESENTING ILLNESS:  Jason Reilly 71 y.o. male is here because of abnormal CT findings, which is very suspicious for malignancy. He is on ranitidine from [place], has been on in the [place] for 16 years. He came in with his son and an interpreter.  He has been feeling fatigued since two month ago. He is still able to do all ADLs. He otherwise denies any pain, bloating or nausea.  He lost about 20lbs in 3 month. His appetite is lower than before, eats less, no change of his bowl habits.  She denied any hematochezia or melana. Per his son, he has had some personality changes daily, irritable, slightly confused some time.  He was evaluated by his primary care physician. Lab test reviewed hepatitis B infection, which he did not know before, and elevated alkaline phosphatase, his liver function and the rest of the liver function was not remarkable. [place] of abdomen was obtained on 07/22/2014, which showed diffusely abnormal liver with multiple echogenic lesions. CT of abdomen with and without contrast was done on 08/26/2014, which reviewed here at a medically with multiple large partially calcified hepatic masses consistent with metastatic disease. Mild retroperitoneal adenopathy with the largest node measuring 1.6 cm. And nonspecific 1.4 cm left adrenal nodule was also noticed. His tumor marker showed CEA greater than 10,000, CA 19-9 12,929, AFP 3.2 (normal). He was referred to [place] [facility] liver clinic and was evaluated by nurse practitioner [name]. Treatment for hepatitis B was not recommended based on his virus load.  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 He was evaluated by a cardiologist in 2014. He denies any significant dyspnea on exertion.  No leg swollen.  CURRENT THERAPY:  1. panitumumab 97m/kg, every 2 weeks, started on 07/12/2015 2. Pending FOLFOX, and will change to CAPOX from cycle 2  3. He also receives IV magnesium 6g weekly   INTERIM HISTORY: POdisreturns for follow-up and chemotherapy. He is doing okay today. His daughter is translating for him. They went on vacation last week but forgot his medications. His son says he is not very active and that his memory has been getting worse. He has a lot of itching at his port site after treatment. Denies pain, fever, loss of appetite, fatigue, numbness, tingling, bowel problems, or any other concerns.   MEDICAL HISTORY:  Past Medical History:  Diagnosis Date  . Abnormal EKG   . Diabetes mellitus without complication (HCC)    metformin  . Gastric ulcer   . H. pylori infection   . Hepatitis B   . Hypertension   . Metastatic [place] cancer to liver ([place]   .  Non-ischemic cardiomyopathy ([place])   . Tachycardia     SURGICAL HISTORY: Past Surgical History:  Procedure Laterality Date  . ESOPHAGOGASTRODUODENOSCOPY N/A 02/08/2015   Procedure: ESOPHAGOGASTRODUODENOSCOPY (EGD);  Surgeon: Ladene Artist, MD;  Location: [facility];  Service: Endoscopy;  Laterality: N/A;  . ESOPHAGOGASTRODUODENOSCOPY (EGD) WITH PROPOFOL N/A 08/15/2015   Procedure: ESOPHAGOGASTRODUODENOSCOPY (EGD) WITH PROPOFOL;  Surgeon: Ladene Artist, MD;  Location: [facility];  Service: Endoscopy;  Laterality: N/A;  . LEFT AND RIGHT HEART CATHETERIZATION WITH CORONARY ANGIOGRAM N/A 03/29/2013   Procedure: LEFT AND RIGHT HEART CATHETERIZATION WITH CORONARY ANGIOGRAM;  Surgeon: Pixie Casino, MD;  Location: [facility] CATH LAB;  Service: Cardiovascular;  Laterality: N/A;  . Nuclear Stress Test  03/03/2013   High risk - consistent with nonischemic cardiomyopathy    SOCIAL HISTORY: Social History   Social History  . Marital status: Married    Spouse name: N/A  . Number of children: N/A  . Years of education: N/A     Occupational History  . Not on file.   Social History Main Topics  . Smoking status: Former Smoker    Types: Cigarettes    Quit date: 11/07/2008  . Smokeless tobacco: Never Used  . Alcohol use Yes     Comment: occasional  . Drug use: No  . Sexual activity: No   Other Topics Concern  . Not on file   Social History Narrative   Married   Enjoys walking   Has lived in [place] > 16 years    FAMILY HISTORY: No family history of liver disease or malignancy.  ALLERGIES:  has No Known Allergies.  MEDICATIONS:  Current Outpatient Prescriptions on File Prior to Visit  Medication Sig Dispense Refill  . clindamycin (CLINDAGEL) 1 % gel Apply topically 2 (two) times daily. 30 g 2  . diphenoxylate-atropine (LOMOTIL) 2.5-0.025 MG tablet Take 1-2 tablets by mouth 4 (four) times daily as needed for diarrhea or loose stools. 30 tablet 0  . folic acid (FOLVITE) 1 MG tablet Take 1 tablet (1 mg total) by mouth daily. 30 tablet 3  . hydrocortisone 1 % ointment Apply 1 application topically 2 (two) times daily. 56 g 0  . lidocaine-prilocaine (EMLA) cream Apply 1 application topically as needed. Apply to [facility] cath at least one and a half hour to two hours  before needle stick as needed. 30 g 2  . lisinopril-hydrochlorothiazide (PRINZIDE,ZESTORETIC) 10-12.5 MG tablet     . loperamide (IMODIUM) 2 MG capsule Take 2 capsules (4 mg total) by mouth 4 (four) times daily as needed for diarrhea or loose stools (NO MORE THAN 8 TABLETS PER DAY.). 60 capsule 3  . magic mouthwash SOLN Take 5 mLs by mouth 4 (four) times daily. 120 mL 1  . magnesium oxide (MAG-OX) 400 (241.3 Mg) MG tablet Take 1 tablet (400 mg total) by mouth 3 (three) times daily. 90 tablet 3  . metFORMIN (GLUCOPHAGE) 500 MG tablet Take 1 tablet (500 mg total) by mouth 2 (two) times daily with a meal. 60 tablet 0  . nadolol (CORGARD) 20 MG tablet Take 1 tablet (20 mg total) by mouth 2 (two) times daily. THIS IS A ONE TIME ORDER.  FUTURE REFILLS NEED  TO BE DONE BY PRIMARY MD. 60 tablet 1  . ondansetron (ZOFRAN) 8 MG tablet Take 1 tablet (8 mg total) by mouth every 8 (eight) hours as needed for nausea or vomiting. 45 tablet 2  . potassium chloride SA (K-DUR,KLOR-CON) 20 MEQ tablet Take 1 tablet (  20 mEq total) by mouth daily. 14 tablet 0  . prochlorperazine (COMPAZINE) 10 MG tablet Take 1 tablet (10 mg total) by mouth every 6 (six) hours as needed for nausea or vomiting. 30 tablet 3   Current Facility-Administered Medications on File Prior to Visit  Medication Dose Route Frequency Provider Last Rate Last Dose  . 0.9 %  sodium chloride infusion   Intravenous Once Truitt Merle, MD      . ferumoxytol [facility]) 510 mg in sodium chloride 0.9 % 100 mL IVPB  510 mg Intravenous Once Truitt Merle, MD      . sodium chloride 0.9 % injection 10 mL  10 mL Intracatheter PRN Truitt Merle, MD   10 mL at 10/03/15 1705  . sodium chloride 0.9 % injection 10 mL  10 mL Intravenous PRN Truitt Merle, MD   10 mL at 05/14/16 1127   REVIEW OF SYSTEMS:   Constitutional: Denies fevers, chills or abnormal night sweats  Eyes: Denies blurriness of vision, double vision or watery eyes Ears, nose, mouth, throat, and face: Denies mucositis or sore throat Respiratory: Denies cough, dyspnea or wheezes Cardiovascular: Denies palpitation, chest discomfort or lower extremity swelling Gastrointestinal: Denies nausea, heartburn or change in bowel habits Skin: (+) itching at port site  Lymphatics: Denies new lymphadenopathy or easy bruising Neurological:Denies numbness, tingling or new weaknesses Behavioral/Psych: Mood is stable, no new changes,  All other systems were reviewed with the patient and are negative.  PHYSICAL EXAMINATION:  BP 119/76 (BP Location: Left Arm, Patient Position: Sitting)   Pulse 74   Temp 97.7 F (36.5 C) (Oral)   Resp 18   Ht '5\' 5"'  (1.651 m)   Wt 144 lb 6.4 oz (65.5 kg)   SpO2 100%   BMI 24.03 kg/m  ECOG PERFORMANCE STATUS: 1 GENERAL:alert, no distress and  comfortable SKIN: (+) Dry skin,  no skin ulcer, (+) scatter skin rashes on his neck and upper chest, some are resolving with skin pigmentation. No discharge. EYES: normal, conjunctiva are pink and non-injected, sclera clear OROPHARYNX:no exudate, no erythema and lips, buccal mucosa, and tongue with mild discoloration at the tip. The low front teeth are missing  NECK: supple, thyroid normal size, non-tender, without nodularity LYMPH:  no palpable lymphadenopathy in the cervical, axillary or inguinal LUNGS: clear to auscultation and percussion with normal breathing effort, no rales  HEART: regular rate & rhythm and no murmurs and no lower extremity edema ABDOMEN:abdomen soft, non-tender, no hepatomegaly, no splenomegaly and normal bowel sounds Musculoskeletal:no cyanosis of digits and no clubbing  PSYCH: alert & oriented x 3 with fluent speech NEURO: no focal motor/sensory deficits   LABORATORY DATA:  I have reviewed the data as listed CBC Latest Ref Rng & Units 02/03/2017 01/06/2017 12/30/2016  WBC 4.0 - 10.3 10e3/uL 8.2 5.2 5.1  Hemoglobin 13.0 - 17.1 g/dL 8.3(L) 8.9(L) 8.9(L)  Hematocrit 38.4 - 49.9 % 25.9(L) 27.4(L) 27.6(L)  Platelets 140 - 400 10e3/uL 150 161 183   CMP Latest Ref Rng & Units 02/03/2017 01/06/2017 12/30/2016  Glucose 70 - 140 mg/dl 149(H) 192(H) 112  BUN 7.0 - 26.0 mg/dL 19.6 22.6 18.6  Creatinine 0.7 - 1.3 mg/dL 0.9 1.0 0.8  Sodium 136 - 145 mEq/L 137 140 141  Potassium 3.5 - 5.1 mEq/L 4.6 4.3 4.3  Chloride 101 - 111 mmol/L - - -  CO2 22 - 29 mEq/L 21(L) 25 24  Calcium 8.4 - 10.4 mg/dL 8.9 9.1 8.7  Total Protein 6.4 - 8.3 g/dL 7.4 7.3  7.3  Total Bilirubin 0.20 - 1.20 mg/dL 1.04 0.63 0.46  Alkaline Phos 40 - 150 U/L 272(H) 166(H) 152(H)  AST 5 - 34 U/L 45(H) 32 26  ALT 0 - 55 U/L 40 20 22   INITIAL tumor markers AFP 3.2 CEA > 10,000 CA 19-9 12,929.6  CEA 01/11/2015: 1809 07/05/2015: 386 10/03/2015: 66.4 02/19/2016: 155.8 05/06/2016: 566 07/08/2016: 834 08/05/2016:  1102 10/07/2016: 1733 11/06/2016: 2214.95 12/16/2016: 2858.90 01/03/2017: PENDING   PATHOLOGY Liver, needle/core biopsy 11/21/2014 - METASTATIC ADENOCARCINOMA, SEE COMMENT. Microscopic Comment The adenocarcinoma demonstrates the following immunophenotype: Cytokeratin 7 - negative expression. Cytokeratin 20 - strong diffuse expression. CD2 - strong diffuse expression. Overall the morphology and immunophenotype are that of metastatic adenocarcinoma primary to colorectum. The recent nuclear medicine scan demonstrating sigmoid mass with associated liver masses is noted.  [facility] test result:    RADIOGRAPHIC STUDIES: I have personally reviewed the outside CT scan image with patient and his son.   CT CAP w Contrast 02/03/2017 IMPRESSION: Chest Impression:  1. Interval increase in size of pulmonary nodules as well as several new pulmonary nodules consistent with progression of pulmonary metastasis.  Abdomen / Pelvis Impression:  1. Interval increase in size of hepatic metastasis within LEFT and RIGHT hepatic lobe. 2. 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 Enhanced Submucosal collections in the proximal sigmoid colon are concerning for colorectal carcinoma recurrence. Resolution of descending colitis.   ASSESSMENT & PLAN:  71 y.o. [place] male, with past history of hypertension and dilated nonischemic gammopathy with EF 25%, no clinical signs of heart failure, who was found to have hepatitis B infection lately, and multiple liver lesions on the CT scan. He has extremely high CEA and CA 19-9 levels. PET scan reviewed a hypermetabolic sigmoid colon mass, diffuse liver metastasis, probable lung and adrenal gland metastasis.  1. Metastatic sigmoid colon cancer, with diffuse liver, lungs, node and left adrenal gland metastases. KRAS/NRAS wild type, MSI-stable -Previous Liver biopsy showed metastatic adenocarcinoma. His tumor were strongly positive for CK20 and CD2,  consistent with primary colorectal primary. KRAS and NRAS mutations were not detected.  -Pt understands that this is incurable cancer, and he has very high disease burden and overall prognosis is poor. The treatment goal is palliative -I previously discussed his restaging CT from 08/19/2016 which showed overall stable disease in liver, a few small lung nodules, slightly bigger, will continue monitoring. - his previous scan showed overall some improvement since we restarted on 5-FU in August 2017. -His tumor marker CEA has been trending up, concerning for slow disease progression, we'll continue monitoring -I previously reviewed his restaging CT scan from 11/12/2016, which showed overall stable disease, except one lesion in the liver got slightly bigger. He is clinically doing well, I'll continue his current chemotherapy -I reviewed the CT scan from 02/03/2017 with the patient in detail, images reviewed in person. Unfortunately he has had significant disease progression in the liver -I recommend her to stop FOLFIRI due to disease progression  -He previously had a good response and tolerance to FOLFOX, oxaliplatin was held after he is recurrent GI bleeding. He does not have significant residual neuropathy. I recommended him to try FOLFOX again. -he has been on 5-fu for 2 years. If he does not have significant copay, will change 5-fu to capecitabine from second cycle -Due to his prolonged chemotherapy, I'll reduce his chemotherapy dose.   -Will follow up closely, repeat scan in 2 months to evaluate his response    2. Grade 1-2  skin rashes, stable  -Secondary to panitumumab, stable overall  -continue hydrocortisone 2.5%, and clindamycin gel 1%  twice daily as needed  -He knows to avoid sun exposure, and call me if it gets worse.  3. Gastric ulcer with significant GI bleeding in April and Aug 2016 -He is on PPI, continue once daily  -Repeat his EGD on 08/15/2015 showed near complete healing of his  gastric ulcer -continue Nadolol 13m bid, per Dr. SFuller Plan- I encourage him to follow[facility] has restarted 5-fu   4. Type 2 DM  -His glucose level has increased lately, it was 267 on 12/02/16. I encouraged him to watch his diet, avoid any sweets, and monitor closely at home -I encouraged him to follow-up with his primary care physician Dr. WJimmye [place] He is in the process to change his primary care physician. -I previously recommend him to increase metformin to 1000 mg twice daily, to better control his hyperglycemia.  5. HTN, Dilated nonischemic ischemia cardiomyopathy with EF 25% -He is clinically doing well without symptoms of CHF. However this is probably going to impact his chemotherapy.Will try to avoid cardiotoxic chemotherapy agent and avoid fluid overload during chemotherapy. -Continue follow-up with cardiology. -will hold on his lisinopril-HCTZ for now, his BP has been normal lately   6 Hepatitis B carrier, with mild portal hypertension  -Per liver clinic, no need for treatment. Follow-up with Dr. SSilvio Pate  7. Malnutrition -I encouraged him to eat more, and take supplements as needed. -improved, gained weight back after I reduce his chemotherapy dose  -follow up with Dietitian   8. Anemia secondary to GI bleeding, iron deficiency and chemo  -Repeat lab on 06/06/2015 showed ferritin 117, serum iron 24, saturation 8%, which supports iron deficiency -He received IV Feraheme again in Aug 2016 after GI bleeding  -Repeat a ferritin was 273 on 10/03/2015, much improved  -he received iv feraheme again on 11/27/2015 and 12/04/2015, but anemia did not improve much. -His anemia has been slightly worse lately, probably related to chemotherapy, we'll close monitor any signs of GI bleeding. -Blood transfusion if hemoglobin less than 8 or symptomatic anemia with hemoglobin 8-9 -We previously discussed that the patient does not need a refill on his Folic  Acid since he is not drinking alcohol anymore.  9. hypomagnesemia  -secondary to panitumumab -he receives IV mag 6 g every week  -He is taking magnesium pill 2 tablets 3 times a day -continue monitoring   10. Goal of care discussion  -We again discussed the incurable nature of his cancer, and the overall poor prognosis, especially if he does not have good response to chemotherapy or progress on chemo -The patient understands the goal of care is palliative. -I recommend DNR/DNI, he wishes to be full code for now.   Plan -Change treatment to FOLFOX today with dose reduction, continue panitumumab every 2 weeks  -will change FOLFOX to capecitabine from next cycle, if he dose not have high copay  -repeat scan in 2 months -Refill pantoprazole.  -Mag infusion every Wednesday -Return for f/u in 2 weeks.    All questions were answered. The patient knows to call the clinic with any problems, questions or concerns.  I spent 25 minutes counseling the patient face to face. The total time spent in the appointment was 30 minutes and more than 50% was on counseling.  This document serves as a record of services personally performed by YTruitt Merle MD. It was  created on her behalf by Martinique Casey, a trained medical scribe. The creation of this record is based on the scribe's personal observations and the provider's statements to them. This document has been checked and approved by the attending provider.  I have reviewed the above documentation for accuracy and completeness and I agree with the above.   Truitt Merle, MD 02/03/2017

## 2017-02-03 ENCOUNTER — Ambulatory Visit (HOSPITAL_BASED_OUTPATIENT_CLINIC_OR_DEPARTMENT_OTHER): Payer: Medicare Other | Admitting: Hematology

## 2017-02-03 ENCOUNTER — Encounter (HOSPITAL_COMMUNITY): Payer: Self-pay

## 2017-02-03 ENCOUNTER — Ambulatory Visit (HOSPITAL_COMMUNITY)
Admission: RE | Admit: 2017-02-03 | Discharge: 2017-02-03 | Disposition: A | Discharge status: Home / Self Care | Payer: Medicare Other | Source: Ambulatory Visit | Attending: Hematology | Admitting: Hematology

## 2017-02-03 ENCOUNTER — Other Ambulatory Visit (HOSPITAL_BASED_OUTPATIENT_CLINIC_OR_DEPARTMENT_OTHER): Payer: Medicare Other

## 2017-02-03 ENCOUNTER — Ambulatory Visit (HOSPITAL_BASED_OUTPATIENT_CLINIC_OR_DEPARTMENT_OTHER): Payer: Medicare Other

## 2017-02-03 ENCOUNTER — Encounter: Payer: Self-pay | Admitting: Hematology

## 2017-02-03 VITALS — BP 119/76 | HR 74 | Temp 97.7°F | Resp 18 | Ht 65.0 in | Wt 144.4 lb

## 2017-02-03 DIAGNOSIS — C7800 Secondary malignant neoplasm of unspecified lung: Secondary | ICD-10-CM

## 2017-02-03 DIAGNOSIS — Z5112 Encounter for antineoplastic immunotherapy: Secondary | ICD-10-CM

## 2017-02-03 DIAGNOSIS — Z95828 Presence of other vascular implants and grafts: Secondary | ICD-10-CM

## 2017-02-03 DIAGNOSIS — C786 Secondary malignant neoplasm of retroperitoneum and peritoneum: Secondary | ICD-10-CM | POA: Diagnosis not present

## 2017-02-03 DIAGNOSIS — E119 Type 2 diabetes mellitus without complications: Secondary | ICD-10-CM

## 2017-02-03 DIAGNOSIS — Z5111 Encounter for antineoplastic chemotherapy: Secondary | ICD-10-CM | POA: Diagnosis not present

## 2017-02-03 DIAGNOSIS — Z452 Encounter for adjustment and management of vascular access device: Secondary | ICD-10-CM | POA: Diagnosis not present

## 2017-02-03 DIAGNOSIS — D509 Iron deficiency anemia, unspecified: Secondary | ICD-10-CM

## 2017-02-03 DIAGNOSIS — R21 Rash and other nonspecific skin eruption: Secondary | ICD-10-CM

## 2017-02-03 DIAGNOSIS — C787 Secondary malignant neoplasm of liver and intrahepatic bile duct: Secondary | ICD-10-CM | POA: Diagnosis not present

## 2017-02-03 DIAGNOSIS — K7460 Unspecified cirrhosis of liver: Secondary | ICD-10-CM | POA: Diagnosis not present

## 2017-02-03 DIAGNOSIS — D6481 Anemia due to antineoplastic chemotherapy: Secondary | ICD-10-CM

## 2017-02-03 DIAGNOSIS — I5042 Chronic combined systolic (congestive) and diastolic (congestive) heart failure: Secondary | ICD-10-CM

## 2017-02-03 DIAGNOSIS — C187 Malignant neoplasm of sigmoid colon: Secondary | ICD-10-CM

## 2017-02-03 DIAGNOSIS — R918 Other nonspecific abnormal finding of lung field: Secondary | ICD-10-CM | POA: Insufficient documentation

## 2017-02-03 DIAGNOSIS — E46 Unspecified protein-calorie malnutrition: Secondary | ICD-10-CM | POA: Diagnosis not present

## 2017-02-03 DIAGNOSIS — D500 Iron deficiency anemia secondary to blood loss (chronic): Secondary | ICD-10-CM

## 2017-02-03 DIAGNOSIS — C7972 Secondary malignant neoplasm of left adrenal gland: Secondary | ICD-10-CM | POA: Diagnosis not present

## 2017-02-03 DIAGNOSIS — I10 Essential (primary) hypertension: Secondary | ICD-10-CM

## 2017-02-03 LAB — CBC WITH DIFFERENTIAL/PLATELET
BASO%: 0.2 % (ref 0.0–2.0)
Basophils Absolute: 0 10*3/uL (ref 0.0–0.1)
EOS%: 2.4 % (ref 0.0–7.0)
Eosinophils Absolute: 0.2 10*3/uL (ref 0.0–0.5)
HEMATOCRIT: 25.9 % — AB (ref 38.4–49.9)
HGB: 8.3 g/dL — ABNORMAL LOW (ref 13.0–17.1)
LYMPH#: 1 10*3/uL (ref 0.9–3.3)
LYMPH%: 11.6 % — ABNORMAL LOW (ref 14.0–49.0)
MCH: 27.5 pg (ref 27.2–33.4)
MCHC: 32 g/dL (ref 32.0–36.0)
MCV: 85.8 fL (ref 79.3–98.0)
MONO#: 0.9 10*3/uL (ref 0.1–0.9)
MONO%: 11.4 % (ref 0.0–14.0)
NEUT%: 74.4 % (ref 39.0–75.0)
NEUTROS ABS: 6.1 10*3/uL (ref 1.5–6.5)
Platelets: 150 10*3/uL (ref 140–400)
RBC: 3.02 10*6/uL — AB (ref 4.20–5.82)
RDW: 16.8 % — ABNORMAL HIGH (ref 11.0–14.6)
WBC: 8.2 10*3/uL (ref 4.0–10.3)

## 2017-02-03 LAB — COMPREHENSIVE METABOLIC PANEL
ALT: 40 U/L (ref 0–55)
AST: 45 U/L — AB (ref 5–34)
Albumin: 3.1 g/dL — ABNORMAL LOW (ref 3.5–5.0)
Alkaline Phosphatase: 272 U/L — ABNORMAL HIGH (ref 40–150)
Anion Gap: 11 mEq/L (ref 3–11)
BUN: 19.6 mg/dL (ref 7.0–26.0)
CHLORIDE: 105 meq/L (ref 98–109)
CO2: 21 mEq/L — ABNORMAL LOW (ref 22–29)
Calcium: 8.9 mg/dL (ref 8.4–10.4)
Creatinine: 0.9 mg/dL (ref 0.7–1.3)
EGFR: 89 mL/min/{1.73_m2} — AB (ref >90)
GLUCOSE: 149 mg/dL — AB (ref 70–140)
Potassium: 4.6 mEq/L (ref 3.5–5.1)
SODIUM: 137 meq/L (ref 136–145)
Total Bilirubin: 1.04 mg/dL (ref 0.20–1.20)
Total Protein: 7.4 g/dL (ref 6.4–8.3)

## 2017-02-03 LAB — CEA (IN HOUSE-CHCC)

## 2017-02-03 LAB — MAGNESIUM: MAGNESIUM: 1.1 mg/dL — AB (ref 1.5–2.5)

## 2017-02-03 MED ORDER — SODIUM CHLORIDE 0.9 % IV SOLN
Freq: Once | INTRAVENOUS | Status: AC
  Administered 2017-02-03: 13:00:00 via INTRAVENOUS

## 2017-02-03 MED ORDER — PALONOSETRON HCL INJECTION 0.25 MG/5ML
INTRAVENOUS | Status: AC
  Filled 2017-02-03: qty 5

## 2017-02-03 MED ORDER — LEUCOVORIN CALCIUM INJECTION 350 MG
400.0000 mg/m2 | Freq: Once | INTRAVENOUS | Status: AC
  Administered 2017-02-03: 692 mg via INTRAVENOUS
  Filled 2017-02-03: qty 34.6

## 2017-02-03 MED ORDER — PALONOSETRON HCL INJECTION 0.25 MG/5ML
0.2500 mg | Freq: Once | INTRAVENOUS | Status: AC
  Administered 2017-02-03: 0.25 mg via INTRAVENOUS

## 2017-02-03 MED ORDER — IOPAMIDOL (ISOVUE-300) INJECTION 61%
INTRAVENOUS | Status: AC
  Filled 2017-02-03: qty 100

## 2017-02-03 MED ORDER — SODIUM CHLORIDE 0.9 % IV SOLN: 10.0000 mg | Freq: Once | INTRAVENOUS | Status: DC

## 2017-02-03 MED ORDER — SODIUM CHLORIDE 0.9 % IV SOLN
2000.0000 mg/m2 | INTRAVENOUS | Status: DC
  Administered 2017-02-03: 3450 mg via INTRAVENOUS
  Filled 2017-02-03: qty 69

## 2017-02-03 MED ORDER — OXALIPLATIN CHEMO INJECTION 100 MG/20ML
85.0000 mg/m2 | Freq: Once | INTRAVENOUS | Status: AC
  Administered 2017-02-03: 145 mg via INTRAVENOUS
  Filled 2017-02-03: qty 19

## 2017-02-03 MED ORDER — SODIUM CHLORIDE 0.9 % IJ SOLN
10.0000 mL | INTRAMUSCULAR | Status: DC | PRN
  Administered 2017-02-03: 10 mL via INTRAVENOUS
  Filled 2017-02-03: qty 10

## 2017-02-03 MED ORDER — DEXAMETHASONE SODIUM PHOSPHATE 10 MG/ML IJ SOLN
INTRAMUSCULAR | Status: AC
  Filled 2017-02-03: qty 1

## 2017-02-03 MED ORDER — HEPARIN SOD (PORK) LOCK FLUSH 100 UNIT/ML IV SOLN
INTRAVENOUS | Status: AC
  Administered 2017-02-03: 500 [IU] via INTRAVENOUS
  Filled 2017-02-03: qty 5

## 2017-02-03 MED ORDER — IOPAMIDOL (ISOVUE-300) INJECTION 61%
100.0000 mL | Freq: Once | INTRAVENOUS | Status: AC | PRN
  Administered 2017-02-03: 100 mL via INTRAVENOUS

## 2017-02-03 MED ORDER — PANTOPRAZOLE SODIUM 40 MG PO TBEC: 40.0000 mg | DELAYED_RELEASE_TABLET | Freq: Every day | ORAL | 2 refills | Status: DC

## 2017-02-03 MED ORDER — SODIUM CHLORIDE 0.9 % IV SOLN
400.0000 mg | Freq: Once | INTRAVENOUS | Status: AC
  Administered 2017-02-03: 400 mg via INTRAVENOUS
  Filled 2017-02-03: qty 20

## 2017-02-03 MED ORDER — DEXAMETHASONE SODIUM PHOSPHATE 10 MG/ML IJ SOLN
10.0000 mg | Freq: Once | INTRAMUSCULAR | Status: AC
  Administered 2017-02-03: 10 mg via INTRAVENOUS

## 2017-02-03 MED ORDER — HEPARIN SOD (PORK) LOCK FLUSH 100 UNIT/ML IV SOLN
500.0000 [IU] | Freq: Once | INTRAVENOUS | Status: AC
  Administered 2017-02-03: 500 [IU] via INTRAVENOUS

## 2017-02-03 MED ORDER — DEXTROSE 5 % IV SOLN
Freq: Once | INTRAVENOUS | Status: AC
  Administered 2017-02-03: 12:00:00 via INTRAVENOUS

## 2017-02-03 NOTE — Patient Instructions (Signed)
[facility] Discharge Instructions for Patients Receiving Chemotherapy  Today you received the following chemotherapy agents: Oxaliplatin, Leucovorin, Vectibix   To help prevent nausea and vomiting after your treatment, we encourage you to take your nausea medication as prescribed.   If you develop nausea and vomiting that is not controlled by your nausea medication, call the clinic.   BELOW ARE SYMPTOMS THAT SHOULD BE REPORTED IMMEDIATELY:  *FEVER GREATER THAN 100.5 F  *CHILLS WITH OR WITHOUT FEVER  NAUSEA AND VOMITING THAT IS NOT CONTROLLED WITH YOUR NAUSEA MEDICATION  *UNUSUAL SHORTNESS OF BREATH  *UNUSUAL BRUISING OR BLEEDING  TENDERNESS IN MOUTH AND THROAT WITH OR WITHOUT PRESENCE OF ULCERS  *URINARY PROBLEMS  *BOWEL PROBLEMS  UNUSUAL RASH Items with * indicate a potential emergency and should be followed up as soon as possible.  Feel free to call the clinic you have any questions or concerns. The clinic phone number is [phone].  Please show the [facility] at check-in to the Emergency Department and triage nurse.  Oxaliplatin Injection What is this medicine? OXALIPLATIN (ox AL i PLA tin) is a chemotherapy drug. It targets fast dividing cells, like cancer cells, and causes these cells to die. This medicine is used to treat cancers of the colon and rectum, and many other cancers. This medicine may be used for other purposes; ask your health care provider or pharmacist if you have questions. COMMON BRAND NAME(S): Eloxatin What should I tell my health care provider before I take this medicine? They need to know if you have any of these conditions: -kidney disease -an unusual or allergic reaction to oxaliplatin, other chemotherapy, other medicines, foods, dyes, or preservatives -pregnant or trying to get pregnant -breast-feeding How should I use this medicine? This drug is given as an infusion into a vein. It is administered in a hospital or  clinic by a specially trained health care professional. Talk to your pediatrician regarding the use of this medicine in children. Special care may be needed. Overdosage: If you think you have taken too much of this medicine contact a poison control center or emergency room at once. NOTE: This medicine is only for you. Do not share this medicine with others. What if I miss a dose? It is important not to miss a dose. Call your doctor or health care professional if you are unable to keep an appointment. What may interact with this medicine? -medicines to increase blood counts like filgrastim, pegfilgrastim, sargramostim -probenecid -some antibiotics like amikacin, gentamicin, neomycin, polymyxin B, streptomycin, tobramycin -zalcitabine Talk to your doctor or health care professional before taking any of these medicines: -acetaminophen -aspirin -ibuprofen -ketoprofen -naproxen This list may not describe all possible interactions. Give your health care provider a list of all the medicines, herbs, non-prescription drugs, or dietary supplements you use. Also tell them if you smoke, drink alcohol, or use illegal drugs. Some items may interact with your medicine. What should I watch for while using this medicine? Your condition will be monitored carefully while you are receiving this medicine. You will need important blood work done while you are taking this medicine. This medicine can make you more sensitive to cold. Do not drink cold drinks or use ice. Cover exposed skin before coming in contact with cold temperatures or cold objects. When out in cold weather wear warm clothing and cover your mouth and nose to warm the air that goes into your lungs. Tell your doctor if you get sensitive to the cold. This  drug may make you feel generally unwell. This is not uncommon, as chemotherapy can affect healthy cells as well as cancer cells. Report any side effects. Continue your course of treatment even though  you feel ill unless your doctor tells you to stop. In some cases, you may be given additional medicines to help with side effects. Follow all directions for their use. Call your doctor or health care professional for advice if you get a fever, chills or sore throat, or other symptoms of a cold or flu. Do not treat yourself. This drug decreases your body's ability to fight infections. Try to avoid being around people who are sick. This medicine may increase your risk to bruise or bleed. Call your doctor or health care professional if you notice any unusual bleeding. Be careful brushing and flossing your teeth or using a toothpick because you may get an infection or bleed more easily. If you have any dental work done, tell your dentist you are receiving this medicine. Avoid taking products that contain aspirin, acetaminophen, ibuprofen, naproxen, or ketoprofen unless instructed by your doctor. These medicines may hide a fever. Do not become pregnant while taking this medicine. Women should inform their doctor if they wish to become pregnant or think they might be pregnant. There is a potential for serious side effects to an unborn child. Talk to your health care professional or pharmacist for more information. Do not breast-feed an infant while taking this medicine. Call your doctor or health care professional if you get diarrhea. Do not treat yourself. What side effects may I notice from receiving this medicine? Side effects that you should report to your doctor or health care professional as soon as possible: -allergic reactions like skin rash, itching or hives, swelling of the face, lips, or tongue -low blood counts - This drug may decrease the number of white blood cells, red blood cells and platelets. You may be at increased risk for infections and bleeding. -signs of infection - fever or chills, cough, sore throat, pain or difficulty passing urine -signs of decreased platelets or bleeding -  bruising, pinpoint red spots on the skin, black, tarry stools, nosebleeds -signs of decreased red blood cells - unusually weak or tired, fainting spells, lightheadedness -breathing problems -chest pain, pressure -cough -diarrhea -jaw tightness -mouth sores -nausea and vomiting -pain, swelling, redness or irritation at the injection site -pain, tingling, numbness in the hands or feet -problems with balance, talking, walking -redness, blistering, peeling or loosening of the skin, including inside the mouth -trouble passing urine or change in the amount of urine Side effects that usually do not require medical attention (report to your doctor or health care professional if they continue or are bothersome): -changes in vision -constipation -hair loss -loss of appetite -metallic taste in the mouth or changes in taste -stomach pain This list may not describe all possible side effects. Call your doctor for medical advice about side effects. You may report side effects to FDA at 1-800-FDA-1088. Where should I keep my medicine? This drug is given in a hospital or clinic and will not be stored at home. NOTE: This sheet is a summary. It may not cover all possible information. If you have questions about this medicine, talk to your doctor, pharmacist, or health care provider.  2018 Elsevier/Gold Standard (2008-05-10 17:22:47)  Leucovorin injection What is this medicine? LEUCOVORIN (loo koe VOR in) is used to prevent or treat the harmful effects of some medicines. This medicine is used to treat anemia  caused by a low amount of folic acid in the body. It is also used with 5-fluorouracil (5-FU) to treat colon cancer. This medicine may be used for other purposes; ask your health care provider or pharmacist if you have questions. What should I tell my health care provider before I take this medicine? They need to know if you have any of these conditions: -anemia from low levels of vitamin B-12 in  the blood -an unusual or allergic reaction to leucovorin, folic acid, other medicines, foods, dyes, or preservatives -pregnant or trying to get pregnant -breast-feeding How should I use this medicine? This medicine is for injection into a muscle or into a vein. It is given by a health care professional in a hospital or clinic setting. Talk to your pediatrician regarding the use of this medicine in children. Special care may be needed. Overdosage: If you think you have taken too much of this medicine contact a poison control center or emergency room at once. NOTE: This medicine is only for you. Do not share this medicine with others. What if I miss a dose? This does not apply. What may interact with this medicine? -capecitabine -fluorouracil -phenobarbital -phenytoin -primidone -trimethoprim-sulfamethoxazole This list may not describe all possible interactions. Give your health care provider a list of all the medicines, herbs, non-prescription drugs, or dietary supplements you use. Also tell them if you smoke, drink alcohol, or use illegal drugs. Some items may interact with your medicine. What should I watch for while using this medicine? Your condition will be monitored carefully while you are receiving this medicine. This medicine may increase the side effects of 5-fluorouracil, 5-FU. Tell your doctor or health care professional if you have diarrhea or mouth sores that do not get better or that get worse. What side effects may I notice from receiving this medicine? Side effects that you should report to your doctor or health care professional as soon as possible: -allergic reactions like skin rash, itching or hives, swelling of the face, lips, or tongue -breathing problems -fever, infection -mouth sores -unusual bleeding or bruising -unusually weak or tired Side effects that usually do not require medical attention (report to your doctor or health care professional if they continue or  are bothersome): -constipation or diarrhea -loss of appetite -nausea, vomiting This list may not describe all possible side effects. Call your doctor for medical advice about side effects. You may report side effects to FDA at 1-800-FDA-1088. Where should I keep my medicine? This drug is given in a hospital or clinic and will not be stored at home. NOTE: This sheet is a summary. It may not cover all possible information. If you have questions about this medicine, talk to your doctor, pharmacist, or health care provider.  2018 Elsevier/Gold Standard (2008-04-19 16:50:29)

## 2017-02-04 MED ORDER — CAPECITABINE 500 MG PO TABS
850.0000 mg/m2 | ORAL_TABLET | Freq: Two times a day (BID) | ORAL | 2 refills | Status: DC
Start: 2017-02-04 — End: 2017-02-05

## 2017-02-04 NOTE — Addendum Note (Signed)
Addended by: Truitt Merle on: 02/04/2017 03:30 PM   Modules accepted: Orders

## 2017-02-05 ENCOUNTER — Ambulatory Visit (HOSPITAL_BASED_OUTPATIENT_CLINIC_OR_DEPARTMENT_OTHER): Payer: Medicare Other

## 2017-02-05 VITALS — BP 133/59 | HR 63 | Temp 98.0°F | Resp 18

## 2017-02-05 DIAGNOSIS — C187 Malignant neoplasm of sigmoid colon: Secondary | ICD-10-CM

## 2017-02-05 DIAGNOSIS — Z5111 Encounter for antineoplastic chemotherapy: Secondary | ICD-10-CM

## 2017-02-05 DIAGNOSIS — D509 Iron deficiency anemia, unspecified: Secondary | ICD-10-CM

## 2017-02-05 MED ORDER — CAPECITABINE 500 MG PO TABS: 850.0000 mg/m2 | ORAL_TABLET | Freq: Two times a day (BID) | ORAL | 2 refills | Status: DC

## 2017-02-05 MED ORDER — SODIUM CHLORIDE 0.9 % IV SOLN
700.0000 mg | Freq: Once | INTRAVENOUS | Status: AC
  Administered 2017-02-05: 700 mg via INTRAVENOUS
  Filled 2017-02-05: qty 14

## 2017-02-05 MED ORDER — SODIUM CHLORIDE 0.9 % IV SOLN
Freq: Once | INTRAVENOUS | Status: AC
  Administered 2017-02-05: 11:00:00 via INTRAVENOUS

## 2017-02-05 MED ORDER — HEPARIN SOD (PORK) LOCK FLUSH 100 UNIT/ML IV SOLN
500.0000 [IU] | Freq: Once | INTRAVENOUS | Status: AC | PRN
  Administered 2017-02-05: 500 [IU]
  Filled 2017-02-05: qty 5

## 2017-02-05 MED ORDER — SODIUM CHLORIDE 0.9 % IV SOLN
Freq: Once | INTRAVENOUS | Status: AC
  Administered 2017-02-05: 11:00:00 via INTRAVENOUS
  Filled 2017-02-05: qty 250

## 2017-02-05 MED ORDER — SODIUM CHLORIDE 0.9% FLUSH
10.0000 mL | INTRAVENOUS | Status: DC | PRN
  Administered 2017-02-05: 10 mL
  Filled 2017-02-05: qty 10

## 2017-02-05 MED ORDER — FLUOROURACIL CHEMO INJECTION 2.5 GM/50ML
700.0000 mg | Freq: Once | INTRAVENOUS | Status: AC
  Administered 2017-02-05: 700 mg via INTRAVENOUS
  Filled 2017-02-05: qty 14

## 2017-02-05 NOTE — Patient Instructions (Signed)
[facility] Discharge Instructions for Patients Receiving Chemotherapy  Today you received the following chemotherapy agents: Adrucil   To help prevent nausea and vomiting after your treatment, we encourage you to take your nausea medication as directed.    If you develop nausea and vomiting that is not controlled by your nausea medication, call the clinic.   BELOW ARE SYMPTOMS THAT SHOULD BE REPORTED IMMEDIATELY:  *FEVER GREATER THAN 100.5 F  *CHILLS WITH OR WITHOUT FEVER  NAUSEA AND VOMITING THAT IS NOT CONTROLLED WITH YOUR NAUSEA MEDICATION  *UNUSUAL SHORTNESS OF BREATH  *UNUSUAL BRUISING OR BLEEDING  TENDERNESS IN MOUTH AND THROAT WITH OR WITHOUT PRESENCE OF ULCERS  *URINARY PROBLEMS  *BOWEL PROBLEMS  UNUSUAL RASH Items with * indicate a potential emergency and should be followed up as soon as possible.  Feel free to call the clinic you have any questions or concerns. The clinic phone number is [phone].  Please show the [facility] at check-in to the Emergency Department and triage nurse.   Hypomagnesemia Hypomagnesemia is a condition in which the level of magnesium in the blood is low. Magnesium is a mineral that is found in many foods. It is used in many different processes in the body. Hypomagnesemia can affect every organ in the body. It can cause life-threatening problems. What are the causes? Causes of hypomagnesemia include:  Not getting enough magnesium in your diet.  Malnutrition.  Problems with absorbing magnesium from the intestines.  Dehydration.  Alcohol abuse.  Vomiting.  Severe diarrhea.  Some medicines, including medicines that make you urinate more.  Certain diseases, such as kidney disease, diabetes, and overactive thyroid. What are the signs or symptoms?  Involuntary shaking or trembling of a body part (tremor).  Confusion.  Muscle weakness.  Sensitivity to light, sound, and touch.  Psychiatric issues,  such as depression, irritability, or psychosis.  Sudden tightening of muscles (muscle spasms).  Tingling in the arms and legs.  A feeling of fluttering of the heart. These symptoms are more severe if magnesium levels drop suddenly. How is this diagnosed? To make a diagnosis, your health care provider will do a physical exam and order blood and urine tests. How is this treated? Treatment will depend on the cause and the severity of your condition. It may involve:  A magnesium supplement. This can be taken in pill form. It can also be given through an IV tube. This is usually done if the condition is severe.  Changes to your diet. You may be directed to eat foods that have a lot of magnesium, such as green leafy vegetables, peas, beans, and nuts.  Eliminating alcohol from your diet. Follow these instructions at home:  Include foods with magnesium in your diet. Foods that are rich in magnesium include green vegetables, beans, nuts and seeds, and whole grains.  Take medicines only as directed by your health care provider.  Take magnesium supplements if your health care provider instructs you to do that. Take them as directed.  Have your magnesium levels monitored as directed by your health care provider.  When you are active, drink fluids that contain electrolytes.  Keep all follow-up visits as directed by your health care provider. This is important. Contact a health care provider if:  You get worse instead of better.  Your symptoms return. Get help right away if:  Your symptoms are severe. This information is not intended to replace advice given to you by your health care provider. Make sure  you discuss any questions you have with your health care provider. Document Released: 07/10/2005 Document Revised: 03/21/2016 Document Reviewed: 05/30/2014 Elsevier Interactive Patient Education  2017 [employer].

## 2017-02-05 NOTE — Addendum Note (Signed)
Addended by: Neysa Hotter on: 02/05/2017 07:32 PM   Modules accepted: Orders

## 2017-02-05 NOTE — Progress Notes (Signed)
1030: Pt reports to clinic with patient's son. Patient's son reports that Pt removed port a cath needle and pump at approx 8pm 02/04/17, and stopped pump at that time. 60.9 cc remaining in pump. Pharmacy and [name] aware. Per [name] and Pharmacy okay to give 400mg / m2 bolus over 5 minutes and remaining over a 3 hour infusion along side Magnesium infusion. Pt and patient's son aware.  1533: Adrucil infusion stopped 1545: blood return noted before, during and after Adrucil push.  1550: pt stable at discharge.

## 2017-02-06 ENCOUNTER — Ambulatory Visit: Payer: Medicare Other

## 2017-02-06 ENCOUNTER — Telehealth: Payer: Self-pay | Admitting: Hematology

## 2017-02-06 NOTE — Telephone Encounter (Signed)
sw pt son to confirm next weeks appt date/times per LOS

## 2017-02-10 ENCOUNTER — Telehealth: Payer: Self-pay | Admitting: Pharmacist

## 2017-02-10 DIAGNOSIS — C187 Malignant neoplasm of sigmoid colon: Secondary | ICD-10-CM

## 2017-02-10 MED ORDER — CAPECITABINE 500 MG PO TABS: 850.0000 mg/m2 | ORAL_TABLET | Freq: Two times a day (BID) | ORAL | 2 refills | Status: DC

## 2017-02-10 NOTE — Telephone Encounter (Signed)
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 Possible decrease in absorption of Xeloda with the concurrent use of a PPI,. Conflicting retrospective data on length pf progression free survival in lower stage CRC patients.  Prescription will be e-scribed to the [facility] for benifits analysis.  Oral Oncology Clinic will continue to follow.  Johny Drilling, PharmD, BCPS, BCOP 02/10/2017  3:06 PM Oral Oncology Clinic [phone]

## 2017-02-12 ENCOUNTER — Ambulatory Visit (HOSPITAL_BASED_OUTPATIENT_CLINIC_OR_DEPARTMENT_OTHER): Payer: Medicare Other

## 2017-02-12 VITALS — BP 165/66 | HR 65 | Temp 97.8°F | Resp 16

## 2017-02-12 DIAGNOSIS — D509 Iron deficiency anemia, unspecified: Secondary | ICD-10-CM

## 2017-02-12 DIAGNOSIS — Z95828 Presence of other vascular implants and grafts: Secondary | ICD-10-CM

## 2017-02-12 DIAGNOSIS — C187 Malignant neoplasm of sigmoid colon: Secondary | ICD-10-CM | POA: Diagnosis not present

## 2017-02-12 MED ORDER — SODIUM CHLORIDE 0.9 % IJ SOLN
10.0000 mL | INTRAMUSCULAR | Status: DC | PRN
  Administered 2017-02-12: 10 mL via INTRAVENOUS
  Filled 2017-02-12: qty 10

## 2017-02-12 MED ORDER — SODIUM CHLORIDE 0.9 % IV SOLN
Freq: Once | INTRAVENOUS | Status: AC
  Administered 2017-02-12: 12:00:00 via INTRAVENOUS
  Filled 2017-02-12: qty 250

## 2017-02-12 MED ORDER — HEPARIN SOD (PORK) LOCK FLUSH 100 UNIT/ML IV SOLN
500.0000 [IU] | Freq: Once | INTRAVENOUS | Status: AC | PRN
  Administered 2017-02-12: 500 [IU] via INTRAVENOUS
  Filled 2017-02-12: qty 5

## 2017-02-12 MED FILL — CAPECITABINE 500 MG TABLET: 500 | 14 days supply | Qty: 84 | Fill #0

## 2017-02-12 NOTE — Patient Instructions (Signed)
Magnesium Sulfate injection ?y l thu?c g? MAGNESIUM SULFATE l ch?t ?i?n gi?i d?ng tim th??ng ???c dng ?? ?i?u tr? ch?ng magnesium th?p ? trong mu. Thu?c c?ng ???c dng ?? phng ng?a hay ki?m sot cc c?n co gi?t ? ph? n? b? ch?ng ti?n kinh gi?t/s?n gi?t hay b? ch?ng kinh gi?t/s?n gi?t. Thu?c ny c th? ???c dng cho nh?ng m?c ?ch khc; hy h?i ng??i cung c?p d?ch v? y t? ho?c d??c s? c?a mnh, n?u qu v? c th?c m?c. Ti c?n ph?i bo cho ng??i cung c?p d?ch v? y t? c?a mnh ?i?u g tr??c khi dng thu?c ny? H? c?n bi?t li?u qu v? c b?t k? tnh tr?ng no sau ?y khng: -b?nh tim -ti?n s? tim ??p khng ??u -b?nh th?n -pha?n ??ng b?t th???ng ho??c di? ??ng v??i magnesium sulfate ho?c cc lo?i thu?c -pha?n ??ng b?t th???ng ho??c di? ??ng v??i th??c ph?m, thu?c nhu?m, ho??c ch?t ba?o qua?n -?ang c thai ho??c ??nh co? thai -?ang cho con bu? Ti nn s? d?ng thu?c ny nh? th? no? Thu?c ny ?? truy?n vo t?nh m?ch. Thu?c ny ???c s? d?ng b?i chuyn vin y t? ? b?nh vi?n ho?c ? phng m?ch. Hy bn v?i bc s? nhi khoa c?a qu v? v? vi?c dng thu?c ny ? tr? em. Thu?c ny c th? ???c k toa trong nh?ng tr??ng h?p ch?n l?c, nh?ng c?n ph?i th?n tr?ng. Qu li?u: N?u qu v? cho r?ng mnh ? dng qu nhi?u thu?c ny, th hy lin l?c v?i trung tm ki?m sot ch?t ??c ho?c phng c?p c?u ngay l?p t?c. L?U : Thu?c ny ch? dnh ring cho qu v?. Khng chia s? thu?c ny v?i nh?ng ng??i khc. N?u ti l? qun m?t li?u th sao? ?i?u ny khng p d?ng. Nh?ng g c th? t??ng tc v?i thu?c ny? Thu?c ny c th? t??ng tc v?i cc thu?c sau ?y: -m?t s? thu?c dng cho ch?ng lo u ho?c cc v?n ?? v? gi?c ng? -m?t s? thu?c dng cho cc ch?ng co gi?t, ch?ng h?n nh? phenobarbital -digoxin -cc thu?c lm gin c? ?? gi?i ph?u -cc thu?c gi?m ?au gy ng? (narcotic) Dan[facility] sch ny c th? khng m t? ?? h?t cc t??ng tc c th? x?y ra. Hy ??a cho ng??i cung c?p d?ch v? y t? c?a mnh danh sch t?t c? cc  thu?c, th?o d??c, cc thu?c khng c?n toa, ho?c cc ch? ph?m b? sung m qu v? dng. C?ng nn bo cho h? bi?t r?ng qu v? c ht thu?c, u?ng r??u, ho?c c s? d?ng ma ty tri php hay khng. Vi th? c th? t??ng tc v?i thu?c c?a qu v?. Ti c?n ph?i theo di ?i?u g trong khi dng thu?c ny? Qu v? s? ???c theo di ch?t ch? trong khi dng thu?c ny. Qu v? c th? c?n ph?i ???c lm xt nghi?m mu trong th?i gian dng thu?c ny. Ti c th? nh?n th?y nh?ng tc d?ng ph? no khi dng thu?c ny? Nh?ng tc d?ng ph? qu v? c?n ph?i bo cho bc s? ho?c chuyn vin y t? cng s?m cng t?t: -cc ph?n ?ng d? ?ng, ch?ng h?n nh? da b? m?n ??, ng?a, n?i my ?ay, s?ng ? m?t, mi, ho?c l??i -ph?ng m?t -y?u c? b?p -cc d?u hi?u v tri?u ch?ng huy?t p th?p, ch?ng h?n nh? chng m?t; c?m th?y chong vng ho?c ng?t x?u, b? ng; c?m th?y y?u ?t ho?c m?t m?i khc th??ng -cc d?u hi?u v tri?u  ch?ng c?a s? thay ??i nguy hi?m c?a nh?p tim, ch?ng h?n nh? ?au ng?c, chng m?t, nh?p tim nhanh ho?c khng ??u, ?nh tr?ng ng?c; cc v?n ?? v? h h?p -v m? hi [facility] sch ny c th? khng m t? ?? h?t cc tc d?ng ph? c th? x?y ra. Xin g?i t?i bc s? c?a mnh ?? ???c c? v?n chuyn mn v? cc tc d?ng ph?Sander Nephew v? c th? t??ng trnh cc tc d?ng ph? cho FDA theo s? [phone]. Ti nn c?t gi? thu?c c?a mnh ? ?u? Thu?c ny ???c s? d?ng b?i chuyn vin y t? ? b?nh vi?n ho?c ? phng m?ch. Qu v? s? khng ???c c?p thu?c ny ?? c?t gi? t?i nh. L?U : ?y l b?n tm t?t. N c th? khng bao hm t?t c? thng tin c th? c. N?u qu v? th?c m?c v? thu?c ny, xin trao ??i v?i bc s?, d??c s?, ho?c ng??i cung c?p d?ch v? y t? c?a mnh.  2018 Elsevier/Gold Standard (2016-11-14 00:00:00)

## 2017-02-13 NOTE — Telephone Encounter (Signed)
Oral Chemotherapy Pharmacist Encounter  Received notification from [facility] that patient's Xeloda copayment is $331.15 I requested the pharmacy contact the patient as there is copayment support for Xeloda or the patient's diagnosis. MD will be updated.  Oral Oncology Clinic will continue to follow.  Johny Drilling, PharmD, BCPS, BCOP 02/13/2017  10:19 AM Oral Oncology Clinic [phone]

## 2017-02-13 NOTE — Progress Notes (Signed)
[place]  [phone] [phone]  Clinic follow up Note   Patient Care Team: Jason Junior, MD as PCP - General (Specialist) Jason Reilly, CRNP as Nurse Practitioner (Nurse Practitioner) Jason Merle, MD as Consulting Physician (Hematology) Jason Artist, MD as Consulting Physician (Gastroenterology) 02/17/2017  CHIEF COMPLAINTS Follow up metastatic sigmoid colon cancer  Oncology History   He is a Metastatic colon cancer to liver   Staging form: Colon and Rectum, AJCC 7th Edition     Clinical: Stage Unknown ([place], NX, M1) - Unsigned      Cancer of sigmoid colon ([place])   10/28/2014 Tumor Marker    AFP 3.2 CEA > 10,000 CA 19-9 12,929.6. tumor (-) KRAS and NRAS mutation.       11/10/2014 Imaging    PET scan showed hypermetabolic mass in the sigmoid colon was noted metastasis in the retroperitoneum. Probable left adrenal and pulmonary metastasis, and diffuse liver metastasis.      11/21/2014 Initial Diagnosis    Metastatic colon cancer to liver, lung, abd nodes and left adrenal gland. Diagnosis was made by liver biopsy.       11/30/2014 - 05/23/2015 Chemotherapy    First line chemo [id number], Panitumumab added from second cycle, chemo held due to recurrent GI bleeding        12/28/2014 - 12/31/2014 Hospital Admission    Was admitted for dehydration, neutropenia fever with UTI, and severe skin rash.      02/08/2015 - 02/12/2015 Hospital Admission    He was admitted for upper GI bleeding, e.g. showed a gastric ulcer with clots, status post appendectomy injection. He also received a blood transfusion.      03/01/2015 Tumor Marker    CEA 694, CA19.9 462      03/13/2015 Imaging    CT CAP showed partial response, no new lesions.       05/31/2015 - 06/02/2015 Hospital Admission    He was admitted to [facility] in [place] due to upper GI bleeding, EGD showed gastric and dudenal ulcers       06/27/2015 - 06/29/2015 Hospital Admission    he was admitted  for dairrhea and pancolitis, c-diff and stool cultures were negative, treated with antibiotics      07/12/2015 -  Chemotherapy    panitumumab 57m/kg, every 2 weeks      09/19/2015 -  Chemotherapy    Irinotecan 1869mm2 every 2 weeks, dose reduction due to diarrhea. 5-Fu added on 06/10/2016 due to slight disease progression       11/08/2015 - 11/10/2015 Hospital Admission    Pt was admitted for sepsis and hypotension, was found to have (+) influenza A and treated.       11/30/2015 Imaging     CT scans reviewed continued improvement in the liver metastasis, no other new lesions.      05/06/2016 Tumor Marker    CEA has gradually increased from 110 in March 2017 to 566 in July 2017      06/04/2016 Imaging    Restaging CT scan showed similar to mild increase in hepatic metastasis, mild enlargement of right adrenal nodule, primary similar pulmonary nodules. A left upper lobe nodule has resolved.      11/12/2016 Imaging    CT CAP - Restaging IMPRESSION: 1. No significant change in the appearance of multifocal pulmonary nodules. 2. Previously referenced partially calcified liver metastases are stable from previous exam. Within the posterior right lobe of liver there is a metastasis which appears  slightly increased in size from previous exam. 3. Mild decrease in size of right adrenal nodule. 4. There is mild wall thickening and inflammation involving the descending colon and proximal sigmoid colon worrisome for colitis. A small intramural fluid collection is identified which is unchanged from 08/19/2016, significance unknown. 5. Aortic atherosclerosis.      02/03/2017 Imaging    CT CAP w Contrast Chest Impression: 1. Interval increase in size of pulmonary nodules as well as several new pulmonary nodules consistent with progression of pulmonary metastasis.  Abdomen / Pelvis Impression: 1. Interval increase in size of hepatic metastasis within LEFT and RIGHT hepatic lobe. 2. 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 Enhanced Submucosal collections in the proximal sigmoid colon are concerning for colorectal carcinoma recurrence. Resolution of descending colitis.       HISTORY OF PRESENTING ILLNESS:  Jason Reilly 71 y.o. male is here because of abnormal CT findings, which is very suspicious for malignancy. He is on ranitidine from [place], has been on in the [place] for 16 years. He came in with his son and an interpreter.  He has been feeling fatigued since two month ago. He is still able to do all ADLs. He otherwise denies any pain, bloating or nausea.  He lost about 20lbs in 3 month. His appetite is lower than before, eats less, no change of his bowl habits.  She denied any hematochezia or melana. Per his son, he has had some personality changes daily, irritable, slightly confused some time.  He was evaluated by his primary care physician. Lab test reviewed hepatitis B infection, which he did not know before, and elevated alkaline phosphatase, his liver function and the rest of the liver function was not remarkable. [place] of abdomen was obtained on 07/22/2014, which showed diffusely abnormal liver with multiple echogenic lesions. CT of abdomen with and without contrast was done on 08/26/2014, which reviewed here at a medically with multiple large partially calcified hepatic masses consistent with metastatic disease. Mild retroperitoneal adenopathy with the largest node measuring 1.6 cm. And nonspecific 1.4 cm left adrenal nodule was also noticed. His tumor marker showed CEA greater than 10,000, CA 19-9 12,929, AFP 3.2 (normal). He was referred to [facility] liver clinic and was evaluated by nurse practitioner Jason Reilly. Treatment for hepatitis B was not recommended based on his virus load.  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 He was evaluated by a cardiologist in 2014. He denies any significant dyspnea on exertion.  No leg swollen.  CURRENT THERAPY:  1. panitumumab 50m/kg, every 2 weeks, started on 07/12/2015 2. CAPOX with dose reduction every 3 weeks started on 02/17/2017 3. He also receives IV magnesium 6g weekly   INTERIM HISTORY: [place] follow[place] and chemotherapy. Last treatment on 4/9. The patient is accompanied by his son today, who helps to translate for the patient. The patient reports he is doing well today. He denies any issues with neuropathy. He denies any bowel or bladder concerns. He denies sore throat or dry mouth.   MEDICAL HISTORY:  Past Medical History:  Diagnosis Date  . Abnormal EKG   . Diabetes mellitus without complication (HCC)    metformin  . Gastric ulcer   . H. pylori infection   . Hepatitis B   . Hypertension   . Metastatic colon cancer to liver ([facility]still   . Non-ischemic cardiomyopathy ([facility]   . Tachycardia     SURGICAL HISTORY: Past Surgical History:  Procedure Laterality Date  . ESOPHAGOGASTRODUODENOSCOPY N/A 02/08/2015   Procedure: ESOPHAGOGASTRODUODENOSCOPY (EGD);  Surgeon: Jason Artist, MD;  Location: [facility];  Service: Endoscopy;  Laterality: N/A;  . ESOPHAGOGASTRODUODENOSCOPY (EGD) WITH PROPOFOL N/A 08/15/2015   Procedure: ESOPHAGOGASTRODUODENOSCOPY (EGD) WITH PROPOFOL;  Surgeon: Jason Artist, MD;  Location: [facility];  Service: Endoscopy;  Laterality: N/A;  . LEFT AND RIGHT HEART CATHETERIZATION WITH CORONARY ANGIOGRAM N/A 03/29/2013   Procedure: LEFT AND RIGHT HEART CATHETERIZATION WITH CORONARY ANGIOGRAM;  Surgeon: Pixie Casino, MD;  Location: [facility] CATH LAB;  Service: Cardiovascular;  Laterality: N/A;  . Nuclear Stress Test  03/03/2013   High risk - consistent with nonischemic cardiomyopathy    SOCIAL HISTORY: Social History   Social History  . Marital status: Married    Spouse name: N/A  . Number of children: N/A  . Years of education: N/A   Occupational History  . Not on file.   Social History Main Topics  . Smoking  status: Former Smoker    Types: Cigarettes    Quit date: 11/07/2008  . Smokeless tobacco: Never Used  . Alcohol use Yes     Comment: occasional  . Drug use: No  . Sexual activity: No   Other Topics Concern  . Not on file   Social History Narrative   Married   Enjoys walking   Has lived in [place] > 16 years    FAMILY HISTORY: No family history of liver disease or malignancy.  ALLERGIES:  has No Known Allergies.  MEDICATIONS:  Current Outpatient Prescriptions on File Prior to Visit  Medication Sig Dispense Refill  . clindamycin (CLINDAGEL) 1 % gel Apply topically 2 (two) times daily. 30 g 2  . folic acid (FOLVITE) 1 MG tablet Take 1 tablet (1 mg total) by mouth daily. 30 tablet 3  . hydrocortisone 1 % ointment Apply 1 application topically 2 (two) times daily. 56 g 0  . lidocaine-prilocaine (EMLA) cream Apply 1 application topically as needed. Apply to [facility] cath at least one and a half hour to two hours  before needle stick as needed. 30 g 2  . lisinopril-hydrochlorothiazide (PRINZIDE,ZESTORETIC) 10-12.5 MG tablet     . loperamide (IMODIUM) 2 MG capsule Take 2 capsules (4 mg total) by mouth 4 (four) times daily as needed for diarrhea or loose stools (NO MORE THAN 8 TABLETS PER DAY.). 60 capsule 3  . magic mouthwash SOLN Take 5 mLs by mouth 4 (four) times daily. 120 mL 1  . magnesium oxide (MAG-OX) 400 (241.3 Mg) MG tablet Take 1 tablet (400 mg total) by mouth 3 (three) times daily. 90 tablet 3  . metFORMIN (GLUCOPHAGE) 500 MG tablet Take 1 tablet (500 mg total) by mouth 2 (two) times daily with a meal. 60 tablet 0  . nadolol (CORGARD) 20 MG tablet Take 1 tablet (20 mg total) by mouth 2 (two) times daily. THIS IS A ONE TIME ORDER.  FUTURE REFILLS NEED TO BE DONE BY PRIMARY MD. 60 tablet 1  . ondansetron (ZOFRAN) 8 MG tablet Take 1 tablet (8 mg total) by mouth every 8 (eight) hours as needed for nausea or vomiting. 45 tablet 2  . pantoprazole (PROTONIX) 40 MG tablet Take 1 tablet (40 mg  total) by mouth daily. 30 tablet 2  . potassium chloride SA (K-DUR,KLOR-CON) 20 MEQ tablet Take 1 tablet (20 mEq total) by mouth daily. 14 tablet 0  . prochlorperazine (COMPAZINE) 10 MG tablet Take 1 tablet (10 mg total) by mouth every  6 (six) hours as needed for nausea or vomiting. 30 tablet 3  . capecitabine (XELODA) 500 MG tablet Take 3 tablets (1,500 mg total) by mouth 2 (two) times daily after a meal. Take for 14 days on, 7 days off, repeat every 21 days (Patient not taking: Reported on 02/17/2017) 84 tablet 2  . diphenoxylate-atropine (LOMOTIL) 2.5-0.025 MG tablet Take 1-2 tablets by mouth 4 (four) times daily as needed for diarrhea or loose stools. (Patient not taking: Reported on 02/17/2017) 30 tablet 0   Current Facility-Administered Medications on File Prior to Visit  Medication Dose Route Frequency Provider Last Rate Last Dose  . 0.9 %  sodium chloride infusion   Intravenous Once Malachy Mood, MD      . ferumoxytol [facility]) 510 mg in sodium chloride 0.9 % 100 mL IVPB  510 mg Intravenous Once Malachy Mood, MD      . sodium chloride 0.9 % injection 10 mL  10 mL Intracatheter PRN Malachy Mood, MD   10 mL at 10/03/15 1705  . sodium chloride 0.9 % injection 10 mL  10 mL Intravenous PRN Malachy Mood, MD   10 mL at 05/14/16 1127   REVIEW OF SYSTEMS:   Constitutional: Denies fevers, chills or abnormal night sweats  Eyes: Denies blurriness of vision, double vision or watery eyes Ears, nose, mouth, throat, and face: Denies mucositis, sore throat, or dry mouth Respiratory: Denies cough, dyspnea or wheezes Cardiovascular: Denies palpitation, chest discomfort or lower extremity swelling Gastrointestinal: Denies nausea, heartburn or change in bladder/ bowel habits Skin: (+) itching at port site  Lymphatics: Denies new lymphadenopathy or easy bruising Neurological:Denies numbness, tingling or new weaknesses Behavioral/Psych: Mood is stable, no new changes,  All other systems were reviewed with the patient and  are negative.  PHYSICAL EXAMINATION: BP 136/69 (BP Location: Left Arm, Patient Position: Sitting)   Pulse 80   Temp 98.1 F (36.7 C) (Oral)   Resp 18   Ht 5\' 5"  (1.651 m)   Wt 144 lb 12.8 oz (65.7 kg)   SpO2 100%   BMI 24.10 kg/m  ECOG PERFORMANCE STATUS: 1 GENERAL:alert, no distress and comfortable SKIN: (+) Dry skin,  no skin ulcer, (+) scatter skin rashes on his neck and upper chest, some are resolving with skin pigmentation. No discharge. EYES: normal, conjunctiva are pink and non-injected, sclera clear OROPHARYNX:no exudate, no erythema and lips, buccal mucosa, and tongue with mild discoloration at the tip. The low front teeth are missing  NECK: supple, thyroid normal size, non-tender, without nodularity LYMPH:  no palpable lymphadenopathy in the cervical, axillary or inguinal LUNGS: clear to auscultation and percussion with normal breathing effort, no rales  HEART: regular rate & rhythm and no murmurs and no lower extremity edema ABDOMEN:abdomen soft, non-tender, no hepatomegaly, no splenomegaly and normal bowel sounds Musculoskeletal:no cyanosis of digits and no clubbing  PSYCH: alert & oriented x 3 with fluent speech NEURO: no focal motor/sensory deficits   LABORATORY DATA:  I have reviewed the data as listed CBC Latest Ref Rng & Units 02/17/2017 02/03/2017 01/06/2017  WBC 4.0 - 10.3 10e3/uL 5.2 8.2 5.2  Hemoglobin 13.0 - 17.1 g/dL 03/08/2017) 8.3(L) 8.9(L)  Hematocrit 38.4 - 49.9 % 27.1(L) 25.9(L) 27.4(L)  Platelets 140 - 400 10e3/uL 152 150 161   CMP Latest Ref Rng & Units 02/17/2017 02/03/2017 01/06/2017  Glucose 70 - 140 mg/dl 03/08/2017 153) 794(F)  BUN 7.0 - 26.0 mg/dL 276(D 47.0 92.9  Creatinine 0.7 - 1.3 mg/dL 0.9 0.9 1.0  Sodium  136 - 145 mEq/L 140 137 140  Potassium 3.5 - 5.1 mEq/L 5.0 4.6 4.3  Chloride 101 - 111 mmol/L - - -  CO2 22 - 29 mEq/L 24 21(L) 25  Calcium 8.4 - 10.4 mg/dL 8.6 8.9 9.1  Total Protein 6.4 - 8.3 g/dL 7.9 7.4 7.3  Total Bilirubin 0.20 - 1.20 mg/dL  0.53 1.04 0.63  Alkaline Phos 40 - 150 U/L 186(H) 272(H) 166(H)  AST 5 - 34 U/L 32 45(H) 32  ALT 0 - 55 U/L 23 40 20   INITIAL tumor markers AFP 3.2 CEA > 10,000 CA 19-9 12,929.6  CEA 01/11/2015: 1809 07/05/2015: 386 10/03/2015: 66.4 02/19/2016: 155.8 05/06/2016: 566 07/08/2016: 834 08/05/2016: 1102 10/07/2016: 1733 11/06/2016: 2214.95 12/16/2016: 2858.90 01/06/2017: 3495 02/03/17: 7247  PATHOLOGY Liver, needle/core biopsy 11/21/2014 - METASTATIC ADENOCARCINOMA, SEE COMMENT. Microscopic Comment The adenocarcinoma demonstrates the following immunophenotype: Cytokeratin 7 - negative expression. Cytokeratin 20 - strong diffuse expression. CD2 - strong diffuse expression. Overall the morphology and immunophenotype are that of metastatic adenocarcinoma primary to colorectum. The recent nuclear medicine scan demonstrating sigmoid mass with associated liver masses is noted.  [facility] test result:    RADIOGRAPHIC STUDIES: I have personally reviewed the outside CT scan image with patient and his son.   CT CAP w Contrast 02/03/2017 IMPRESSION: Chest Impression:  1. Interval increase in size of pulmonary nodules as well as several new pulmonary nodules consistent with progression of pulmonary metastasis.  Abdomen / Pelvis Impression:  1. Interval increase in size of hepatic metastasis within LEFT and RIGHT hepatic lobe. 2. 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 Enhanced Submucosal collections in the proximal sigmoid colon are concerning for colorectal carcinoma recurrence. Resolution of descending colitis.   ASSESSMENT & PLAN:  71 y.o. [place] male, with past history of hypertension and dilated nonischemic gammopathy with EF 25%, no clinical signs of heart failure, who was found to have hepatitis B infection lately, and multiple liver lesions on the CT scan. He has extremely high CEA and CA 19-9 levels. PET scan reviewed a hypermetabolic sigmoid colon mass,  diffuse liver metastasis, probable lung and adrenal gland metastasis.  1. Metastatic sigmoid colon cancer, with diffuse liver, lungs, node and left adrenal gland metastases. KRAS/NRAS wild type, MSI-stable -Previous Liver biopsy showed metastatic adenocarcinoma. His tumor were strongly positive for CK20 and CD2, consistent with primary colorectal primary. KRAS and NRAS mutations were not detected.  -Pt understands that this is incurable cancer, and he has very high disease burden and overall prognosis is poor. The treatment goal is palliative -I previously discussed his restaging CT from 08/19/2016 which showed overall stable disease in liver, a few small lung nodules, slightly bigger, will continue monitoring. - his previous scan showed overall some improvement since we restarted on 5-FU in August 2017. -His tumor marker CEA has been trending up, concerning for slow disease progression, we'll continue monitoring -I previously reviewed his restaging CT scan from 11/12/2016, which showed overall stable disease, except one lesion in the liver got slightly bigger. He is clinically doing well, I'll continue his current chemotherapy -I reviewed the CT scan from 02/03/2017 with the patient in detail, images reviewed in person. Unfortunately he has had significant disease progression in the liver -I recommend him to stop FOLFIRI due to disease progression. -He previously had a good response and tolerance to FOLFOX, oxaliplatin was held after he is recurrent GI bleeding. He does not have significant residual neuropathy. I recommended him to try FOLFOX again. -He has  had several incidents of pulling the needle from his port at evening, 5-fu pump infusion is not feasible anymore  -I will start him on CAPOX today -Due to his prolonged chemotherapy, I'll reduce his chemotherapy dose.  -He will take Xeloda '1500mg'$  twice a day for 2 weeks, with the third week off. The patient will continue with oxali infusions every 3  weeks; Dose reduction to '85mg'$ /m2 for this cycle, may slightly increase in future, due to age and comorbidities. The patient understands and is in agreement. - I advised the patient to use lotion while on Xeloda to decrease severity of skin dryness and peeling. We discussed other possible side effects of this chemo regimen again, he voiced good understanding    -Will follow up closely, repeat scan in 2 months to evaluate his response   2. Grade 1-2 skin rashes, stable  -Secondary to panitumumab, stable overall  -continue hydrocortisone 2.5%, and clindamycin gel 1%  twice daily as needed  -He knows to avoid sun exposure, and call me if it gets worse.  3. Gastric ulcer with significant GI bleeding in April and Aug 2016 -He is on PPI, continue once daily  -Repeat his EGD on 08/15/2015 showed near complete healing of his gastric ulcer -continue Nadolol '20mg'$  bid, per Dr. Fuller Plan - I encourage him to follow-up with Dr. Fuller Plan [facility] monitor him closely since he has restarted 5-fu   4. Type 2 DM  -His glucose level has increased lately, it was 267 on 12/02/16. I encouraged him to watch his diet, avoid any sweets, and monitor closely at home -I encouraged him to follow-up with his primary care physician Dr. Jimmye [place]. He is in the process to change his primary care physician. -I previously recommended him to increase metformin to 1000 mg twice daily, to better control his hyperglycemia.  5. HTN, Dilated nonischemic ischemia cardiomyopathy with EF 25% -He is clinically doing well without symptoms of CHF. However this is probably going to impact his chemotherapy.Will try to avoid cardiotoxic chemotherapy agent and avoid fluid overload during chemotherapy. -Continue follow-up with cardiology. -will hold on his lisinopril-HCTZ for now, his BP has been normal lately (02/03/17)  6 Hepatitis B carrier, with mild portal hypertension  -Per liver clinic, no need for treatment. Follow-up with Dr. Silvio Pate   7.  Malnutrition -I encouraged him to eat more, and take supplements as needed. -improved, gained weight back after I reduce his chemotherapy dose  -follow up with Dietitian   8. Anemia secondary to GI bleeding, iron deficiency and chemo  -Repeat lab on 06/06/2015 showed ferritin 117, serum iron 24, saturation 8%, which supports iron deficiency -He received IV Feraheme again in Aug 2016 after GI bleeding  -Repeat a ferritin was 273 on 10/03/2015, much improved  -he received iv feraheme again on 11/27/2015 and 12/04/2015, but anemia did not improve much. -His anemia has been slightly worse lately, probably related to chemotherapy, we'll close monitor any signs of GI bleeding. -Blood transfusion if hemoglobin less than 8 or symptomatic anemia with hemoglobin 8-9 -We previously discussed that the patient does not need a refill on his Folic Acid since he is not drinking alcohol anymore.  9. hypomagnesemia  -secondary to panitumumab -he receives IV mag 6 g every week  -He is taking magnesium pill 2 tablets 3 times a day -continue monitoring   10. Goal of care discussion  -We again discussed the incurable nature of his cancer, and the overall poor prognosis, especially if he does not have good  response to chemotherapy or progress on chemo -The patient understands the goal of care is palliative. -I recommend DNR/DNI, he wishes to be full code for now.   Plan -lab reviewed  - Change treatment to Xeloda and oxaliplatin every 3 weeks, and continue panitumumab in 2 and 4 weeks. Dose reduction due to age and comorbidities. - The patient will fill his Xeloda prescription today. - No refills needed today. -will schedule his chemo and weekly magnesium infusion  -I will see him back in 3 weeks   All questions were answered. The patient knows to call the clinic with any problems, questions or concerns.  I spent 25 minutes counseling the patient face to face. The total time spent in the appointment was 30  minutes and more than 50% was on counseling.  This document serves as a record of services personally performed by Jason Merle, MD. It was created on her behalf by Maryla Morrow, a trained medical scribe. The creation of this record is based on the scribe's personal observations and the provider's statements to them. This document has been checked and approved by the attending provider.  I have reviewed the above documentation for accuracy and completeness and I agree with the above.   Jason Merle, MD 02/17/2017

## 2017-02-14 NOTE — Telephone Encounter (Signed)
Oral Chemotherapy Pharmacist Encounter  Received notification from [facility] that patient's Xeloda copayment $0 as they had not yet run his claim through his Medicaid. They will call patient's son to pick up Xeloda.  Johny Drilling, PharmD, BCPS, BCOP 02/14/2017  9:19 AM Oral Oncology Clinic [phone]

## 2017-02-17 ENCOUNTER — Ambulatory Visit (HOSPITAL_BASED_OUTPATIENT_CLINIC_OR_DEPARTMENT_OTHER): Payer: Medicare Other

## 2017-02-17 ENCOUNTER — Encounter: Payer: Self-pay | Admitting: Hematology

## 2017-02-17 ENCOUNTER — Ambulatory Visit (HOSPITAL_BASED_OUTPATIENT_CLINIC_OR_DEPARTMENT_OTHER): Payer: Medicare Other | Admitting: Hematology

## 2017-02-17 ENCOUNTER — Other Ambulatory Visit (HOSPITAL_BASED_OUTPATIENT_CLINIC_OR_DEPARTMENT_OTHER): Payer: Medicare Other

## 2017-02-17 VITALS — BP 136/69 | HR 80 | Temp 98.1°F | Resp 18 | Ht 65.0 in | Wt 144.8 lb

## 2017-02-17 DIAGNOSIS — C7972 Secondary malignant neoplasm of left adrenal gland: Secondary | ICD-10-CM | POA: Diagnosis not present

## 2017-02-17 DIAGNOSIS — D509 Iron deficiency anemia, unspecified: Secondary | ICD-10-CM

## 2017-02-17 DIAGNOSIS — C787 Secondary malignant neoplasm of liver and intrahepatic bile duct: Secondary | ICD-10-CM

## 2017-02-17 DIAGNOSIS — C187 Malignant neoplasm of sigmoid colon: Secondary | ICD-10-CM

## 2017-02-17 DIAGNOSIS — I10 Essential (primary) hypertension: Secondary | ICD-10-CM

## 2017-02-17 DIAGNOSIS — E119 Type 2 diabetes mellitus without complications: Secondary | ICD-10-CM

## 2017-02-17 DIAGNOSIS — R21 Rash and other nonspecific skin eruption: Secondary | ICD-10-CM | POA: Diagnosis not present

## 2017-02-17 DIAGNOSIS — D6481 Anemia due to antineoplastic chemotherapy: Secondary | ICD-10-CM

## 2017-02-17 DIAGNOSIS — I5042 Chronic combined systolic (congestive) and diastolic (congestive) heart failure: Secondary | ICD-10-CM

## 2017-02-17 DIAGNOSIS — E46 Unspecified protein-calorie malnutrition: Secondary | ICD-10-CM | POA: Diagnosis not present

## 2017-02-17 DIAGNOSIS — C7800 Secondary malignant neoplasm of unspecified lung: Secondary | ICD-10-CM | POA: Diagnosis not present

## 2017-02-17 DIAGNOSIS — Z5111 Encounter for antineoplastic chemotherapy: Secondary | ICD-10-CM

## 2017-02-17 DIAGNOSIS — Z5112 Encounter for antineoplastic immunotherapy: Secondary | ICD-10-CM

## 2017-02-17 LAB — CBC WITH DIFFERENTIAL/PLATELET
BASO%: 0.5 % (ref 0.0–2.0)
Basophils Absolute: 0 10e3/uL (ref 0.0–0.1)
EOS%: 3.1 % (ref 0.0–7.0)
Eosinophils Absolute: 0.2 10e3/uL (ref 0.0–0.5)
HCT: 27.1 % — ABNORMAL LOW (ref 38.4–49.9)
HGB: 8.9 g/dL — ABNORMAL LOW (ref 13.0–17.1)
LYMPH%: 11 % — ABNORMAL LOW (ref 14.0–49.0)
MCH: 28.1 pg (ref 27.2–33.4)
MCHC: 32.8 g/dL (ref 32.0–36.0)
MCV: 85.6 fL (ref 79.3–98.0)
MONO#: 0.7 10e3/uL (ref 0.1–0.9)
MONO%: 12.6 % (ref 0.0–14.0)
NEUT#: 3.8 10e3/uL (ref 1.5–6.5)
NEUT%: 72.8 % (ref 39.0–75.0)
Platelets: 152 10e3/uL (ref 140–400)
RBC: 3.16 10e6/uL — ABNORMAL LOW (ref 4.20–5.82)
RDW: 18.1 % — ABNORMAL HIGH (ref 11.0–14.6)
WBC: 5.2 10e3/uL (ref 4.0–10.3)
lymph#: 0.6 10e3/uL — ABNORMAL LOW (ref 0.9–3.3)

## 2017-02-17 LAB — COMPREHENSIVE METABOLIC PANEL
ALT: 23 U/L (ref 0–55)
AST: 32 U/L (ref 5–34)
Albumin: 3.2 g/dL — ABNORMAL LOW (ref 3.5–5.0)
Alkaline Phosphatase: 186 U/L — ABNORMAL HIGH (ref 40–150)
Anion Gap: 10 mEq/L (ref 3–11)
BILIRUBIN TOTAL: 0.53 mg/dL (ref 0.20–1.20)
BUN: 10.3 mg/dL (ref 7.0–26.0)
CHLORIDE: 107 meq/L (ref 98–109)
CO2: 24 meq/L (ref 22–29)
Calcium: 8.6 mg/dL (ref 8.4–10.4)
Creatinine: 0.9 mg/dL (ref 0.7–1.3)
EGFR: 89 mL/min/{1.73_m2} — AB (ref >90)
GLUCOSE: 135 mg/dL (ref 70–140)
Potassium: 5 mEq/L (ref 3.5–5.1)
SODIUM: 140 meq/L (ref 136–145)
TOTAL PROTEIN: 7.9 g/dL (ref 6.4–8.3)

## 2017-02-17 LAB — MAGNESIUM: MAGNESIUM: 0.9 mg/dL — AB (ref 1.5–2.5)

## 2017-02-17 MED ORDER — PALONOSETRON HCL INJECTION 0.25 MG/5ML
INTRAVENOUS | Status: AC
  Filled 2017-02-17: qty 5

## 2017-02-17 MED ORDER — SODIUM CHLORIDE 0.9 % IV SOLN
6.2000 mg/kg | Freq: Once | INTRAVENOUS | Status: AC
  Administered 2017-02-17: 400 mg via INTRAVENOUS
  Filled 2017-02-17: qty 20

## 2017-02-17 MED ORDER — PALONOSETRON HCL INJECTION 0.25 MG/5ML
0.2500 mg | Freq: Once | INTRAVENOUS | Status: AC
  Administered 2017-02-17: 0.25 mg via INTRAVENOUS

## 2017-02-17 MED ORDER — DEXTROSE 5 % IV SOLN
85.0000 mg/m2 | Freq: Once | INTRAVENOUS | Status: AC
  Administered 2017-02-17: 150 mg via INTRAVENOUS
  Filled 2017-02-17: qty 20

## 2017-02-17 MED ORDER — SODIUM CHLORIDE 0.9 % IV SOLN: 10.0000 mg | Freq: Once | INTRAVENOUS | Status: DC

## 2017-02-17 MED ORDER — DEXAMETHASONE SODIUM PHOSPHATE 10 MG/ML IJ SOLN
10.0000 mg | Freq: Once | INTRAMUSCULAR | Status: AC
Start: 2017-02-17 — End: 2017-02-17
  Administered 2017-02-17: 10 mg via INTRAVENOUS

## 2017-02-17 MED ORDER — DEXAMETHASONE SODIUM PHOSPHATE 10 MG/ML IJ SOLN
INTRAMUSCULAR | Status: AC
  Filled 2017-02-17: qty 1

## 2017-02-17 MED ORDER — HEPARIN SOD (PORK) LOCK FLUSH 100 UNIT/ML IV SOLN
500.0000 [IU] | Freq: Once | INTRAVENOUS | Status: AC | PRN
Start: 2017-02-17 — End: 2017-02-17
  Administered 2017-02-17: 500 [IU]
  Filled 2017-02-17: qty 5

## 2017-02-17 MED ORDER — SODIUM CHLORIDE 0.9% FLUSH
10.0000 mL | INTRAVENOUS | Status: DC | PRN
  Administered 2017-02-17: 10 mL
  Filled 2017-02-17: qty 10

## 2017-02-17 MED ORDER — DEXTROSE 5 % IV SOLN
Freq: Once | INTRAVENOUS | Status: AC
  Administered 2017-02-17: 13:00:00 via INTRAVENOUS

## 2017-02-17 MED ORDER — SODIUM CHLORIDE 0.9 % IV SOLN
Freq: Once | INTRAVENOUS | Status: AC
  Administered 2017-02-17: 10:00:00 via INTRAVENOUS

## 2017-02-17 NOTE — Patient Instructions (Signed)
[place] Discharge Instructions for Patients Receiving Chemotherapy  Today you received the following chemotherapy agents: Vectibix and Oxaliplatin   To help prevent nausea and vomiting after your treatment, we encourage you to take your nausea medication as directed.    If you develop nausea and vomiting that is not controlled by your nausea medication, call the clinic.   BELOW ARE SYMPTOMS THAT SHOULD BE REPORTED IMMEDIATELY:  *FEVER GREATER THAN 100.5 F  *CHILLS WITH OR WITHOUT FEVER  NAUSEA AND VOMITING THAT IS NOT CONTROLLED WITH YOUR NAUSEA MEDICATION  *UNUSUAL SHORTNESS OF BREATH  *UNUSUAL BRUISING OR BLEEDING  TENDERNESS IN MOUTH AND THROAT WITH OR WITHOUT PRESENCE OF ULCERS  *URINARY PROBLEMS  *BOWEL PROBLEMS  UNUSUAL RASH Items with * indicate a potential emergency and should be followed up as soon as possible.  Feel free to call the clinic you have any questions or concerns. The clinic phone number is [phone].  Please show the [place] at check-in to the Emergency Department and triage nurse.

## 2017-02-17 NOTE — Progress Notes (Signed)
Per [name] okay to treat with Magnesium of 0.9.

## 2017-02-18 ENCOUNTER — Telehealth: Payer: Self-pay | Admitting: Hematology

## 2017-02-18 NOTE — Telephone Encounter (Signed)
Patient bypassed scheduling area. Will pick up at next visit.

## 2017-02-19 ENCOUNTER — Ambulatory Visit (HOSPITAL_BASED_OUTPATIENT_CLINIC_OR_DEPARTMENT_OTHER): Payer: Medicare Other

## 2017-02-19 VITALS — BP 139/64 | HR 82 | Temp 98.5°F

## 2017-02-19 DIAGNOSIS — D509 Iron deficiency anemia, unspecified: Secondary | ICD-10-CM

## 2017-02-19 DIAGNOSIS — Z95828 Presence of other vascular implants and grafts: Secondary | ICD-10-CM

## 2017-02-19 MED ORDER — HEPARIN SOD (PORK) LOCK FLUSH 100 UNIT/ML IV SOLN
500.0000 [IU] | Freq: Once | INTRAVENOUS | Status: AC | PRN
  Administered 2017-02-19: 500 [IU] via INTRAVENOUS
  Filled 2017-02-19: qty 5

## 2017-02-19 MED ORDER — SODIUM CHLORIDE 0.9 % IV SOLN
Freq: Once | INTRAVENOUS | Status: AC
  Administered 2017-02-19: 11:00:00 via INTRAVENOUS
  Filled 2017-02-19: qty 250

## 2017-02-19 MED ORDER — SODIUM CHLORIDE 0.9 % IJ SOLN
10.0000 mL | INTRAMUSCULAR | Status: DC | PRN
  Administered 2017-02-19: 10 mL via INTRAVENOUS
  Filled 2017-02-19: qty 10

## 2017-02-19 NOTE — Patient Instructions (Signed)
Magnesium Sulfate injection ?y l thu?c g? MAGNESIUM SULFATE l ch?t ?i?n gi?i d?ng tim th??ng ???c dng ?? ?i?u tr? ch?ng magnesium th?p ? trong mu. Thu?c c?ng ???c dng ?? phng ng?a hay ki?m sot cc c?n co gi?t ? ph? n? b? ch?ng ti?n kinh gi?t/s?n gi?t hay b? ch?ng kinh gi?t/s?n gi?t. Thu?c ny c th? ???c dng cho nh?ng m?c ?ch khc; hy h?i ng??i cung c?p d?ch v? y t? ho?c d??c s? c?a mnh, n?u qu v? c th?c m?c. Ti c?n ph?i bo cho ng??i cung c?p d?ch v? y t? c?a mnh ?i?u g tr??c khi dng thu?c ny? H? c?n bi?t li?u qu v? c b?t k? tnh tr?ng no sau ?y khng: -b?nh tim -ti?n s? tim ??p khng ??u -b?nh th?n -pha?n ??ng b?t th???ng ho??c di? ??ng v??i magnesium sulfate ho?c cc lo?i thu?c -pha?n ??ng b?t th???ng ho??c di? ??ng v??i th??c ph?m, thu?c nhu?m, ho??c ch?t ba?o qua?n -?ang c thai ho??c ??nh co? thai -?ang cho con bu? Ti nn s? d?ng thu?c ny nh? th? no? Thu?c ny ?? truy?n vo t?nh m?ch. Thu?c ny ???c s? d?ng b?i chuyn vin y t? ? b?nh vi?n ho?c ? phng m?ch. Hy bn v?i bc s? nhi khoa c?a qu v? v? vi?c dng thu?c ny ? tr? em. Thu?c ny c th? ???c k toa trong nh?ng tr??ng h?p ch?n l?c, nh?ng c?n ph?i th?n tr?ng. Qu li?u: N?u qu v? cho r?ng mnh ? dng qu nhi?u thu?c ny, th hy lin l?c v?i trung tm ki?m sot ch?t ??c ho?c phng c?p c?u ngay l?p t?c. L?U : Thu?c ny ch? dnh ring cho qu v?. Khng chia s? thu?c ny v?i nh?ng ng??i khc. N?u ti l? qun m?t li?u th sao? ?i?u ny khng p d?ng. Nh?ng g c th? t??ng tc v?i thu?c ny? Thu?c ny c th? t??ng tc v?i cc thu?c sau ?y: -m?t s? thu?c dng cho ch?ng lo u ho?c cc v?n ?? v? gi?c ng? -m?t s? thu?c dng cho cc ch?ng co gi?t, ch?ng h?n nh? phenobarbital -digoxin -cc thu?c lm gin c? ?? gi?i ph?u -cc thu?c gi?m ?au gy ng? (narcotic) Danh sch ny c th? khng m t? ?? h?t cc t??ng tc c th? x?y ra. Hy ??a cho ng??i cung c?p d?ch v? y t? c?a mnh danh sch t?t c? cc  thu?c, th?o d??c, cc thu?c khng c?n toa, ho?c cc ch? ph?m b? sung m qu v? dng. C?ng nn bo cho h? bi?t r?ng qu v? c ht thu?c, u?ng r??u, ho?c c s? d?ng ma ty tri php hay khng. Vi th? c th? t??ng tc v?i thu?c c?a qu v?. Ti c?n ph?i theo di ?i?u g trong khi dng thu?c ny? Qu v? s? ???c theo di ch?t ch? trong khi dng thu?c ny. Qu v? c th? c?n ph?i ???c lm xt nghi?m mu trong th?i gian dng thu?c ny. Ti c th? nh?n th?y nh?ng tc d?ng ph? no khi dng thu?c ny? Nh?ng tc d?ng ph? qu v? c?n ph?i bo cho bc s? ho?c chuyn vin y t? cng s?m cng t?t: -cc ph?n ?ng d? ?ng, ch?ng h?n nh? da b? m?n ??, ng?a, n?i my ?ay, s?ng ? m?t, mi, ho?c l??i -ph?ng m?t -y?u c? b?p -cc d?u hi?u v tri?u ch?ng huy?t p th?p, ch?ng h?n nh? chng m?t; c?m th?y chong vng ho?c ng?t x?u, b? ng; c?m th?y y?u ?t ho?c m?t m?i khc th??ng -cc d?u hi?u v tri?u  ch?ng c?a s? thay ??i nguy hi?m c?a nh?p tim, ch?ng h?n nh? ?au ng?c, chng m?t, nh?p tim nhanh ho?c khng ??u, ?nh tr?ng ng?c; cc v?n ?? v? h h?p -v m? hi Danh sch ny c th? khng m t? ?? h?t cc tc d?ng ph? c th? x?y ra. Xin g?i t?i bc s? c?a mnh ?? ???c c? v?n chuyn mn v? cc tc d?ng ph?Sander Nephew v? c th? t??ng trnh cc tc d?ng ph? cho FDA theo s? [phone]. Ti nn c?t gi? thu?c c?a mnh ? ?u? Thu?c ny ???c s? d?ng b?i chuyn vin y t? ? b?nh vi?n ho?c ? phng m?ch. Qu v? s? khng ???c c?p thu?c ny ?? c?t gi? t?i nh. L?U : ?y l b?n tm t?t. N c th? khng bao hm t?t c? thng tin c th? c. N?u qu v? th?c m?c v? thu?c ny, xin trao ??i v?i bc s?, d??c s?, ho?c ng??i cung c?p d?ch v? y t? c?a mnh.  2018 [employer] Standard (2016-11-14 00:00:00)

## 2017-02-26 ENCOUNTER — Ambulatory Visit (HOSPITAL_BASED_OUTPATIENT_CLINIC_OR_DEPARTMENT_OTHER): Payer: Medicare Other

## 2017-02-26 VITALS — BP 101/65 | HR 66 | Temp 98.5°F | Resp 16

## 2017-02-26 DIAGNOSIS — D509 Iron deficiency anemia, unspecified: Secondary | ICD-10-CM

## 2017-02-26 DIAGNOSIS — Z95828 Presence of other vascular implants and grafts: Secondary | ICD-10-CM

## 2017-02-26 MED ORDER — HEPARIN SOD (PORK) LOCK FLUSH 100 UNIT/ML IV SOLN
500.0000 [IU] | Freq: Once | INTRAVENOUS | Status: AC | PRN
  Administered 2017-02-26: 500 [IU] via INTRAVENOUS
  Filled 2017-02-26: qty 5

## 2017-02-26 MED ORDER — SODIUM CHLORIDE 0.9 % IV SOLN
Freq: Once | INTRAVENOUS | Status: AC
  Administered 2017-02-26: 11:00:00 via INTRAVENOUS
  Filled 2017-02-26: qty 250

## 2017-02-26 MED ORDER — SODIUM CHLORIDE 0.9 % IJ SOLN
10.0000 mL | INTRAMUSCULAR | Status: DC | PRN
  Administered 2017-02-26: 10 mL via INTRAVENOUS
  Filled 2017-02-26: qty 10

## 2017-02-26 NOTE — Patient Instructions (Signed)
Hypomagnesemia Hypomagnesemia is a condition in which the level of magnesium in the blood is low. Magnesium is a mineral that is found in many foods. It is used in many different processes in the body. Hypomagnesemia can affect every organ in the body. It can cause life-threatening problems. What are the causes? Causes of hypomagnesemia include:  Not getting enough magnesium in your diet.  Malnutrition.  Problems with absorbing magnesium from the intestines.  Dehydration.  Alcohol abuse.  Vomiting.  Severe diarrhea.  Some medicines, including medicines that make you urinate more.  Certain diseases, such as kidney disease, diabetes, and overactive thyroid. What are the signs or symptoms?  Involuntary shaking or trembling of a body part (tremor).  Confusion.  Muscle weakness.  Sensitivity to light, sound, and touch.  Psychiatric issues, such as depression, irritability, or psychosis.  Sudden tightening of muscles (muscle spasms).  Tingling in the arms and legs.  A feeling of fluttering of the heart. These symptoms are more severe if magnesium levels drop suddenly. How is this diagnosed? To make a diagnosis, your health care provider will do a physical exam and order blood and urine tests. How is this treated? Treatment will depend on the cause and the severity of your condition. It may involve:  A magnesium supplement. This can be taken in pill form. It can also be given through an IV tube. This is usually done if the condition is severe.  Changes to your diet. You may be directed to eat foods that have a lot of magnesium, such as green leafy vegetables, peas, beans, and nuts.  Eliminating alcohol from your diet. Follow these instructions at home:  Include foods with magnesium in your diet. Foods that are rich in magnesium include green vegetables, beans, nuts and seeds, and whole grains.  Take medicines only as directed by your health care provider.  Take  magnesium supplements if your health care provider instructs you to do that. Take them as directed.  Have your magnesium levels monitored as directed by your health care provider.  When you are active, drink fluids that contain electrolytes.  Keep all follow-up visits as directed by your health care provider. This is important. Contact a health care provider if:  You get worse instead of better.  Your symptoms return. Get help right away if:  Your symptoms are severe. This information is not intended to replace advice given to you by your health care provider. Make sure you discuss any questions you have with your health care provider. Document Released: 07/10/2005 Document Revised: 03/21/2016 Document Reviewed: 05/30/2014 Elsevier Interactive Patient Education  2017 [employer].

## 2017-02-27 ENCOUNTER — Encounter: Payer: Self-pay | Admitting: Hematology

## 2017-02-27 DIAGNOSIS — Z7189 Other specified counseling: Secondary | ICD-10-CM | POA: Insufficient documentation

## 2017-03-03 ENCOUNTER — Ambulatory Visit (HOSPITAL_BASED_OUTPATIENT_CLINIC_OR_DEPARTMENT_OTHER): Payer: Medicare Other

## 2017-03-03 VITALS — BP 126/69 | HR 69 | Temp 99.1°F | Resp 16

## 2017-03-03 DIAGNOSIS — C187 Malignant neoplasm of sigmoid colon: Secondary | ICD-10-CM

## 2017-03-03 DIAGNOSIS — C787 Secondary malignant neoplasm of liver and intrahepatic bile duct: Secondary | ICD-10-CM

## 2017-03-03 DIAGNOSIS — Z5112 Encounter for antineoplastic immunotherapy: Secondary | ICD-10-CM | POA: Diagnosis not present

## 2017-03-03 MED ORDER — HEPARIN SOD (PORK) LOCK FLUSH 100 UNIT/ML IV SOLN
500.0000 [IU] | Freq: Once | INTRAVENOUS | Status: DC | PRN
  Filled 2017-03-03: qty 5

## 2017-03-03 MED ORDER — SODIUM CHLORIDE 0.9 % IJ SOLN
10.0000 mL | INTRAMUSCULAR | Status: DC | PRN
  Filled 2017-03-03: qty 10

## 2017-03-03 MED ORDER — SODIUM CHLORIDE 0.9 % IV SOLN
Freq: Once | INTRAVENOUS | Status: AC
  Administered 2017-03-03: 16:00:00 via INTRAVENOUS

## 2017-03-03 MED ORDER — PANITUMUMAB CHEMO INJECTION 100 MG/5ML
6.3000 mg/kg | Freq: Once | INTRAVENOUS | Status: AC
  Administered 2017-03-03: 400 mg via INTRAVENOUS
  Filled 2017-03-03: qty 20

## 2017-03-03 MED FILL — CAPECITABINE 500 MG TABLET: 500 | 14 days supply | Qty: 84 | Fill #1

## 2017-03-03 NOTE — Progress Notes (Signed)
[name] said ok to treat without current serum magnesium level.

## 2017-03-05 ENCOUNTER — Ambulatory Visit (HOSPITAL_BASED_OUTPATIENT_CLINIC_OR_DEPARTMENT_OTHER): Payer: Medicare Other

## 2017-03-05 DIAGNOSIS — Z95828 Presence of other vascular implants and grafts: Secondary | ICD-10-CM

## 2017-03-05 DIAGNOSIS — D509 Iron deficiency anemia, unspecified: Secondary | ICD-10-CM

## 2017-03-05 MED ORDER — SODIUM CHLORIDE 0.9 % IV SOLN
Freq: Once | INTRAVENOUS | Status: AC
  Administered 2017-03-05: 10:00:00 via INTRAVENOUS
  Filled 2017-03-05: qty 250

## 2017-03-05 MED ORDER — ALTEPLASE 2 MG IJ SOLR
2.0000 mg | Freq: Once | INTRAMUSCULAR | Status: DC | PRN
  Filled 2017-03-05: qty 2

## 2017-03-11 ENCOUNTER — Encounter: Payer: Self-pay | Admitting: Pharmacist

## 2017-03-12 ENCOUNTER — Other Ambulatory Visit (HOSPITAL_BASED_OUTPATIENT_CLINIC_OR_DEPARTMENT_OTHER): Payer: Medicare Other

## 2017-03-12 ENCOUNTER — Ambulatory Visit (HOSPITAL_BASED_OUTPATIENT_CLINIC_OR_DEPARTMENT_OTHER): Payer: Medicare Other | Admitting: Hematology

## 2017-03-12 ENCOUNTER — Ambulatory Visit (HOSPITAL_BASED_OUTPATIENT_CLINIC_OR_DEPARTMENT_OTHER): Payer: Medicare Other

## 2017-03-12 ENCOUNTER — Ambulatory Visit: Payer: Medicare Other

## 2017-03-12 ENCOUNTER — Telehealth: Payer: Self-pay | Admitting: Hematology

## 2017-03-12 ENCOUNTER — Encounter: Payer: Self-pay | Admitting: Hematology

## 2017-03-12 VITALS — BP 113/65 | HR 87 | Temp 98.6°F | Resp 17 | Ht 65.0 in | Wt 140.7 lb

## 2017-03-12 DIAGNOSIS — D509 Iron deficiency anemia, unspecified: Secondary | ICD-10-CM

## 2017-03-12 DIAGNOSIS — E119 Type 2 diabetes mellitus without complications: Secondary | ICD-10-CM

## 2017-03-12 DIAGNOSIS — E46 Unspecified protein-calorie malnutrition: Secondary | ICD-10-CM | POA: Diagnosis not present

## 2017-03-12 DIAGNOSIS — C187 Malignant neoplasm of sigmoid colon: Secondary | ICD-10-CM

## 2017-03-12 DIAGNOSIS — C787 Secondary malignant neoplasm of liver and intrahepatic bile duct: Secondary | ICD-10-CM | POA: Diagnosis not present

## 2017-03-12 DIAGNOSIS — I5042 Chronic combined systolic (congestive) and diastolic (congestive) heart failure: Secondary | ICD-10-CM

## 2017-03-12 DIAGNOSIS — C7972 Secondary malignant neoplasm of left adrenal gland: Secondary | ICD-10-CM | POA: Diagnosis not present

## 2017-03-12 DIAGNOSIS — C7800 Secondary malignant neoplasm of unspecified lung: Secondary | ICD-10-CM

## 2017-03-12 DIAGNOSIS — D6481 Anemia due to antineoplastic chemotherapy: Secondary | ICD-10-CM | POA: Diagnosis not present

## 2017-03-12 DIAGNOSIS — Z95828 Presence of other vascular implants and grafts: Secondary | ICD-10-CM

## 2017-03-12 DIAGNOSIS — I10 Essential (primary) hypertension: Secondary | ICD-10-CM

## 2017-03-12 DIAGNOSIS — Z5111 Encounter for antineoplastic chemotherapy: Secondary | ICD-10-CM | POA: Diagnosis not present

## 2017-03-12 LAB — COMPREHENSIVE METABOLIC PANEL
ALT: 19 U/L (ref 0–55)
AST: 36 U/L — AB (ref 5–34)
Albumin: 3.2 g/dL — ABNORMAL LOW (ref 3.5–5.0)
Alkaline Phosphatase: 123 U/L (ref 40–150)
Anion Gap: 8 mEq/L (ref 3–11)
BUN: 11.8 mg/dL (ref 7.0–26.0)
CHLORIDE: 106 meq/L (ref 98–109)
CO2: 23 meq/L (ref 22–29)
CREATININE: 0.8 mg/dL (ref 0.7–1.3)
Calcium: 8.8 mg/dL (ref 8.4–10.4)
EGFR: >90 mL/min/{1.73_m2} (ref >90)
Glucose: 136 mg/dl (ref 70–140)
Potassium: 4.2 mEq/L (ref 3.5–5.1)
SODIUM: 137 meq/L (ref 136–145)
Total Bilirubin: 1.64 mg/dL — ABNORMAL HIGH (ref 0.20–1.20)
Total Protein: 8.5 g/dL — ABNORMAL HIGH (ref 6.4–8.3)

## 2017-03-12 LAB — CBC WITH DIFFERENTIAL/PLATELET
BASO%: 0.4 % (ref 0.0–2.0)
Basophils Absolute: 0 10*3/uL (ref 0.0–0.1)
EOS%: 1.1 % (ref 0.0–7.0)
Eosinophils Absolute: 0.1 10*3/uL (ref 0.0–0.5)
HCT: 28.8 % — ABNORMAL LOW (ref 38.4–49.9)
HGB: 9.5 g/dL — ABNORMAL LOW (ref 13.0–17.1)
LYMPH%: 12 % — AB (ref 14.0–49.0)
MCH: 29.5 pg (ref 27.2–33.4)
MCHC: 33.1 g/dL (ref 32.0–36.0)
MCV: 89 fL (ref 79.3–98.0)
MONO#: 1.7 10*3/uL — ABNORMAL HIGH (ref 0.1–0.9)
MONO%: 21.8 % — AB (ref 0.0–14.0)
NEUT%: 64.7 % (ref 39.0–75.0)
NEUTROS ABS: 5.1 10*3/uL (ref 1.5–6.5)
Platelets: 142 10*3/uL (ref 140–400)
RBC: 3.24 10*6/uL — ABNORMAL LOW (ref 4.20–5.82)
RDW: 25 % — ABNORMAL HIGH (ref 11.0–14.6)
WBC: 7.8 10*3/uL (ref 4.0–10.3)
lymph#: 0.9 10*3/uL (ref 0.9–3.3)

## 2017-03-12 LAB — CEA (IN HOUSE-CHCC)

## 2017-03-12 LAB — MAGNESIUM: MAGNESIUM: 1.1 mg/dL — AB (ref 1.5–2.5)

## 2017-03-12 MED ORDER — SODIUM CHLORIDE 0.9 % IV SOLN
Freq: Once | INTRAVENOUS | Status: AC
  Administered 2017-03-12: 14:00:00 via INTRAVENOUS

## 2017-03-12 MED ORDER — SODIUM CHLORIDE 0.9 % IV SOLN
Freq: Once | INTRAVENOUS | Status: AC
  Administered 2017-03-12: 14:00:00 via INTRAVENOUS
  Filled 2017-03-12: qty 250

## 2017-03-12 MED ORDER — DEXAMETHASONE SODIUM PHOSPHATE 10 MG/ML IJ SOLN
INTRAMUSCULAR | Status: AC
  Filled 2017-03-12: qty 1

## 2017-03-12 MED ORDER — PALONOSETRON HCL INJECTION 0.25 MG/5ML
0.2500 mg | Freq: Once | INTRAVENOUS | Status: AC
  Administered 2017-03-12: 0.25 mg via INTRAVENOUS

## 2017-03-12 MED ORDER — PALONOSETRON HCL INJECTION 0.25 MG/5ML
INTRAVENOUS | Status: AC
  Filled 2017-03-12: qty 5

## 2017-03-12 MED ORDER — OXALIPLATIN CHEMO INJECTION 100 MG/20ML
85.0000 mg/m2 | Freq: Once | INTRAVENOUS | Status: AC
  Administered 2017-03-12: 150 mg via INTRAVENOUS
  Filled 2017-03-12: qty 10

## 2017-03-12 MED ORDER — HEPARIN SOD (PORK) LOCK FLUSH 100 UNIT/ML IV SOLN
500.0000 [IU] | Freq: Once | INTRAVENOUS | Status: AC | PRN
  Administered 2017-03-12: 500 [IU]
  Filled 2017-03-12: qty 5

## 2017-03-12 MED ORDER — DEXTROSE 5 % IV SOLN
Freq: Once | INTRAVENOUS | Status: AC
  Administered 2017-03-12: 11:00:00 via INTRAVENOUS

## 2017-03-12 MED ORDER — NADOLOL 20 MG PO TABS: 20.0000 mg | ORAL_TABLET | Freq: Two times a day (BID) | ORAL | 1 refills | Status: AC

## 2017-03-12 MED ORDER — DEXAMETHASONE SODIUM PHOSPHATE 10 MG/ML IJ SOLN
10.0000 mg | Freq: Once | INTRAMUSCULAR | Status: AC
  Administered 2017-03-12: 10 mg via INTRAVENOUS

## 2017-03-12 MED ORDER — SODIUM CHLORIDE 0.9 % IJ SOLN
10.0000 mL | INTRAMUSCULAR | Status: DC | PRN
  Administered 2017-03-12: 10 mL via INTRAVENOUS
  Filled 2017-03-12: qty 10

## 2017-03-12 MED ORDER — SODIUM CHLORIDE 0.9% FLUSH
10.0000 mL | INTRAVENOUS | Status: DC | PRN
  Administered 2017-03-12: 10 mL
  Filled 2017-03-12: qty 10

## 2017-03-12 NOTE — Telephone Encounter (Signed)
Lab,Flush, f/u and Chemo ( Oxaliplatin/Magnesium) in 3 weeks. Magnesium in 3, 4 and 5 weeks. Panitumumab on 03/31/17. Appointments scheduled per 03/12/17 los. Patient was given a copy of the AVS report and appointment schedule per 03/12/17 los.

## 2017-03-12 NOTE — Progress Notes (Signed)
Jason Reilly  [phone] [phone]  Clinic follow up Note   Patient Care Team: Jason Junior, MD as PCP - General (Specialist) Jason Reilly, CRNP as Nurse Practitioner (Nurse Practitioner) Jason Merle, MD as Consulting Physician (Hematology) Jason Artist, MD as Consulting Physician (Gastroenterology) 03/12/2017  CHIEF COMPLAINTS Follow up metastatic sigmoid colon cancer  Oncology History   He is a Metastatic colon cancer to liver   Staging form: Colon and Rectum, AJCC 7th Edition     Clinical: Stage Unknown ([place], NX, M1) - Unsigned      Cancer of sigmoid colon ([place])   10/28/2014 Tumor Marker    AFP 3.2 CEA > 10,000 CA 19-9 12,929.6. tumor (-) KRAS and NRAS mutation.       11/10/2014 Imaging    PET scan showed hypermetabolic mass in the sigmoid colon was noted metastasis in the retroperitoneum. Probable left adrenal and pulmonary metastasis, and diffuse liver metastasis.      11/21/2014 Initial Diagnosis    Metastatic colon cancer to liver, lung, abd nodes and left adrenal gland. Diagnosis was made by liver biopsy.       11/30/2014 - 05/23/2015 Chemotherapy    First line chemo [id number], Panitumumab added from second cycle, chemo held due to recurrent GI bleeding        12/28/2014 - 12/31/2014 Hospital Admission    Was admitted for dehydration, neutropenia fever with UTI, and severe skin rash.      02/08/2015 - 02/12/2015 Hospital Admission    He was admitted for upper GI bleeding, e.g. showed a gastric ulcer with clots, status post appendectomy injection. He also received a blood transfusion.      03/01/2015 Tumor Marker    CEA 694, CA19.9 462      03/13/2015 Imaging    CT CAP showed partial response, no new lesions.       05/31/2015 - 06/02/2015 Hospital Admission    He was admitted to [facility] in [place] due to upper GI bleeding, EGD showed gastric and dudenal ulcers       06/27/2015 - 06/29/2015 Hospital Admission    he was  admitted for dairrhea and pancolitis, c-diff and stool cultures were negative, treated with antibiotics      07/12/2015 -  Chemotherapy    panitumumab 78m/kg, every 2 weeks      09/19/2015 -  Chemotherapy    Irinotecan 1887mm2 every 2 weeks, dose reduction due to diarrhea. 5-Fu added on 06/10/2016 due to slight disease progression.   Due to his prolonged chemotherapy, I'll reduce his chemotherapy dose.  He will take Xeloda 150028mwice a day for 2 weeks, with the third week off. The patient will continue with oxali infusions every 3 weeks; Dose reduction to 41m35m for this cycles. Started 02/17/17.      11/08/2015 - 11/10/2015 Hospital Admission    Pt was admitted for sepsis and hypotension, was found to have (+) influenza A and treated.       11/30/2015 Imaging     CT scans reviewed continued improvement in the liver metastasis, no other new lesions.      05/06/2016 Tumor Marker    CEA has gradually increased from 110 in March 2017 to 566 in July 2017      06/04/2016 Imaging    Restaging CT scan showed similar to mild increase in hepatic metastasis, mild enlargement of right adrenal nodule, primary similar pulmonary nodules. A left upper lobe nodule has resolved.  11/12/2016 Imaging    CT CAP - Restaging IMPRESSION: 1. No significant change in the appearance of multifocal pulmonary nodules. 2. Previously referenced partially calcified liver metastases are stable from previous exam. Within the posterior right lobe of liver there is a metastasis which appears slightly increased in size from previous exam. 3. Mild decrease in size of right adrenal nodule. 4. There is mild wall thickening and inflammation involving the descending colon and proximal sigmoid colon worrisome for colitis. A small intramural fluid collection is identified which is unchanged from 08/19/2016, significance unknown. 5. Aortic atherosclerosis.      02/03/2017 Imaging    CT CAP w Contrast Chest  Impression: 1. Interval increase in size of pulmonary nodules as well as several new pulmonary nodules consistent with progression of pulmonary metastasis.  Abdomen / Pelvis Impression: 1. Interval increase in size of hepatic metastasis within LEFT and RIGHT hepatic lobe. 2. 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 Enhanced Submucosal collections in the proximal sigmoid colon are concerning for colorectal carcinoma recurrence. Resolution of descending colitis.       HISTORY OF PRESENTING ILLNESS:  Jason Reilly 71 y.o. male is here because of abnormal CT findings, which is very suspicious for malignancy. He is on ranitidine from [place], has been on in the [place] for 16 years. He came in with his son and an interpreter.  He has been feeling fatigued since two month ago. He is still able to do all ADLs. He otherwise denies any pain, bloating or nausea.  He lost about 20lbs in 3 month. His appetite is lower than before, eats less, no change of his bowl habits.  She denied any hematochezia or melana. Per his son, he has had some personality changes daily, irritable, slightly confused some time.  He was evaluated by his primary care physician. Lab test reviewed hepatitis B infection, which he did not know before, and elevated alkaline phosphatase, his liver function and the rest of the liver function was not remarkable. [place] of abdomen was obtained on 07/22/2014, which showed diffusely abnormal liver with multiple echogenic lesions. CT of abdomen with and without contrast was done on 08/26/2014, which reviewed here at a medically with multiple large partially calcified hepatic masses consistent with metastatic disease. Mild retroperitoneal adenopathy with the largest node measuring 1.6 cm. And nonspecific 1.4 cm left adrenal nodule was also noticed. His tumor marker showed CEA greater than 10,000, CA 19-9 12,929, AFP 3.2 (normal). He was referred to [facility] and was  evaluated by nurse practitioner Jason Reilly. Treatment for hepatitis B was not recommended based on his virus load.  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 He was evaluated by a cardiologist in 2014. He denies any significant dyspnea on exertion. No leg swollen.  CURRENT THERAPY:  1. panitumumab 66m/kg, every 2 weeks, started on 07/12/2015 2. CAPOX with dose reduction every 3 weeks started on 02/17/2017 3. He also receives IV magnesium 4-6g weekly   INTERIM HISTORY: [place] for [place] and chemotherapy cycle 2. He presents to the clinic today with has son and translator: He needs a refill for BP medication. [place] He has been very tired and he has been normal. He has lost 4 pounds since last visit. He lost his appetite and has been resting a lot. This is after the IV Chemo. This lasts for more than one week. He has not been speaking much in  clinic and his son reports he has been forgetting things. Last weekend his stomach hurt for 2 hours. and BM was normal. His son says he did not have this level of confusion or memory loss before starting chemo. Denies nausea and diarrhea or pain. He wants to conintue with same treatment.    MEDICAL HISTORY:  Past Medical History:  Diagnosis Date  . Abnormal EKG   . Diabetes mellitus without complication (HCC)    metformin  . Gastric ulcer   . H. pylori infection   . Hepatitis B   . Hypertension   . Metastatic colon cancer to liver ([place])   . Non-ischemic cardiomyopathy ([place])   . Tachycardia     SURGICAL HISTORY: Past Surgical History:  Procedure Laterality Date  . ESOPHAGOGASTRODUODENOSCOPY N/A 02/08/2015   Procedure: ESOPHAGOGASTRODUODENOSCOPY (EGD);  Surgeon: Jason Artist, MD;  Location: [facility];  Service: Endoscopy;  Laterality: N/A;  . ESOPHAGOGASTRODUODENOSCOPY (EGD) WITH PROPOFOL N/A 08/15/2015   Procedure: ESOPHAGOGASTRODUODENOSCOPY (EGD) WITH PROPOFOL;   Surgeon: Jason Artist, MD;  Location: [facility];  Service: Endoscopy;  Laterality: N/A;  . LEFT AND RIGHT HEART CATHETERIZATION WITH CORONARY ANGIOGRAM N/A 03/29/2013   Procedure: LEFT AND RIGHT HEART CATHETERIZATION WITH CORONARY ANGIOGRAM;  Surgeon: Pixie Casino, MD;  Location[facility] C[facility] LAB;  Service: Cardiovascular;  Laterality: N/A;  . Nuclear Stress Test  03/03/2013   High risk - consistent with nonischemic cardiomyopathy    SOCIAL HISTORY: Social History   Social History  . Marital status: Married    Spouse name: N/A  . Number of children: N/A  . Years of education: N/A   Occupational History  . Not on file.   Social History Main Topics  . Smoking status: Former Smoker    Types: Cigarettes    Quit date: 11/07/2008  . Smokeless tobacco: Never Used  . Alcohol use Yes     Comment: occasional  . Drug use: No  . Sexual activity: No   Other Topics Concern  . Not on file   Social History Narrative   Married   Enjoys walking   Has lived in [place] > 16 years    FAMILY HISTORY: No family history of liver disease or malignancy.  ALLERGIES:  has No Known Allergies.  MEDICATIONS:  Current Outpatient Prescriptions on File Prior to Visit  Medication Sig Dispense Refill  . clindamycin (CLINDAGEL) 1 % gel Apply topically 2 (two) times daily. 30 g 2  . folic acid (FOLVITE) 1 MG tablet Take 1 tablet (1 mg total) by mouth daily. 30 tablet 3  . hydrocortisone 1 % ointment Apply 1 application topically 2 (two) times daily. 56 g 0  . lidocaine-prilocaine (EMLA) cream Apply 1 application topically as needed. Apply to [facility] cath at least one and a half hour to two hours  before needle stick as needed. 30 g 2  . lisinopril-hydrochlorothiazide (PRINZIDE,ZESTORETIC) 10-12.5 MG tablet     . magnesium oxide (MAG-OX) 400 (241.3 Mg) MG tablet Take 1 tablet (400 mg total) by mouth 3 (three) times daily. 90 tablet 3  . metFORMIN (GLUCOPHAGE) 500 MG tablet Take 1 tablet (500 mg total) by mouth  2 (two) times daily with a meal. 60 tablet 0  . pantoprazole (PROTONIX) 40 MG tablet Take 1 tablet (40 mg total) by mouth daily. 30 tablet 2  . potassium chloride SA (K-DUR,KLOR-CON) 20 MEQ tablet Take 1 tablet (20 mEq total) by mouth daily. 14 tablet 0  . capecitabine (XELODA) 500 MG tablet  Take 3 tablets (1,500 mg total) by mouth 2 (two) times daily after a meal. Take for 14 days on, 7 days off, repeat every 21 days (Patient not taking: Reported on 02/17/2017) 84 tablet 2  . diphenoxylate-atropine (LOMOTIL) 2.5-0.025 MG tablet Take 1-2 tablets by mouth 4 (four) times daily as needed for diarrhea or loose stools. (Patient not taking: Reported on 02/17/2017) 30 tablet 0  . loperamide (IMODIUM) 2 MG capsule Take 2 capsules (4 mg total) by mouth 4 (four) times daily as needed for diarrhea or loose stools (NO MORE THAN 8 TABLETS PER DAY.). (Patient not taking: Reported on 03/12/2017) 60 capsule 3  . magic mouthwash SOLN Take 5 mLs by mouth 4 (four) times daily. (Patient not taking: Reported on 03/12/2017) 120 mL 1  . ondansetron (ZOFRAN) 8 MG tablet Take 1 tablet (8 mg total) by mouth every 8 (eight) hours as needed for nausea or vomiting. (Patient not taking: Reported on 03/12/2017) 45 tablet 2  . prochlorperazine (COMPAZINE) 10 MG tablet Take 1 tablet (10 mg total) by mouth every 6 (six) hours as needed for nausea or vomiting. (Patient not taking: Reported on 03/12/2017) 30 tablet 3   Current Facility-Administered Medications on File Prior to Visit  Medication Dose Route Frequency Provider Last Rate Last Dose  . 0.9 %  sodium chloride infusion   Intravenous Once Jason Merle, MD      . ferumoxytol [facility]) 510 mg in sodium chloride 0.9 % 100 mL IVPB  510 mg Intravenous Once Jason Merle, MD      . heparin lock flush 100 unit/mL  500 Units Intracatheter Once PRN Jason Merle, MD      . oxaliplatin (ELOXATIN) 150 mg in dextrose 5 % 500 mL chemo infusion  85 mg/m2 (Treatment Plan Recorded) Intravenous Once Jason Merle,  MD      . sodium chloride 0.9 % 250 mL with magnesium sulfate 6 g infusion   Intravenous Once Drue Second R, NP      . sodium chloride 0.9 % injection 10 mL  10 mL Intracatheter PRN Jason Merle, MD   10 mL at 10/03/15 1705  . sodium chloride 0.9 % injection 10 mL  10 mL Intravenous PRN Jason Merle, MD   10 mL at 05/14/16 1127  . sodium chloride flush (NS) 0.9 % injection 10 mL  10 mL Intracatheter PRN Jason Merle, MD       REVIEW OF SYSTEMS:   Constitutional: Denies fevers, chills or abnormal night sweats (+) weight loss (+) fatigue (+) loss of appetite (+) memory loss Eyes: Denies blurriness of vision, double vision or watery eyes Ears, nose, mouth, throat, and face: Denies mucositis, sore throat, or dry mouth Respiratory: Denies cough, dyspnea or wheezes Cardiovascular: Denies palpitation, chest discomfort or lower extremity swelling Gastrointestinal: Denies nausea, heartburn or change in bladder/ bowel habits Skin: (+) itching at port site  Lymphatics: Denies new lymphadenopathy or easy bruising Neurological:Denies numbness, tingling or new weaknesses Behavioral/Psych: Mood is stable, no new changes  All other systems were reviewed with the patient and are negative.  PHYSICAL EXAMINATION: BP 113/65 (BP Location: Left Arm, Patient Position: Sitting)   Pulse 87   Temp 98.6 F (37 C) (Oral)   Resp 17   Ht '5\' 5"'  (1.651 m)   Wt 140 lb 11.2 oz (63.8 kg)   SpO2 99%   BMI 23.41 kg/m    ECOG PERFORMANCE STATUS: 2 GENERAL:alert, no distress and comfortable SKIN: (+) Dry skin,  no skin ulcer, (+)  scatter skin rashes on his neck and upper chest, some are resolving with skin pigmentation. No discharge. EYES: normal, conjunctiva are pink and non-injected, sclera clear OROPHARYNX:no exudate, no erythema and lips, buccal mucosa, and tongue with mild discoloration at the tip. The low front teeth are missing  NECK: supple, thyroid normal size, non-tender, without nodularity LYMPH:  no palpable  lymphadenopathy in the cervical, axillary or inguinal LUNGS: clear to auscultation and percussion with normal breathing effort, no rales  HEART: regular rate & rhythm and no murmurs and no lower extremity edema ABDOMEN:abdomen soft, non-tender, no hepatomegaly, no splenomegaly and normal bowel sounds Musculoskeletal:no cyanosis of digits and no clubbing  PSYCH: alert & oriented x 3 with fluent speech NEURO: no focal motor/sensory deficits   LABORATORY DATA:  I have reviewed the data as listed CBC Latest Ref Rng & Units 03/12/2017 02/17/2017 02/03/2017  WBC 4.0 - 10.3 10e3/uL 7.8 5.2 8.2  Hemoglobin 13.0 - 17.1 g/dL 9.5(L) 8.9(L) 8.3(L)  Hematocrit 38.4 - 49.9 % 28.8(L) 27.1(L) 25.9(L)  Platelets 140 - 400 10e3/uL 142 152 150   CMP Latest Ref Rng & Units 03/12/2017 02/17/2017 02/03/2017  Glucose 70 - 140 mg/dl 136 135 149(H)  BUN 7.0 - 26.0 mg/dL 11.8 10.3 19.6  Creatinine 0.7 - 1.3 mg/dL 0.8 0.9 0.9  Sodium 136 - 145 mEq/L 137 140 137  Potassium 3.5 - 5.1 mEq/L 4.2 5.0 4.6  Chloride 101 - 111 mmol/L - - -  CO2 22 - 29 mEq/L 23 24 21(L)  Calcium 8.4 - 10.4 mg/dL 8.8 8.6 8.9  Total Protein 6.4 - 8.3 g/dL 8.5(H) 7.9 7.4  Total Bilirubin 0.20 - 1.20 mg/dL 1.64(H) 0.53 1.04  Alkaline Phos 40 - 150 U/L 123 186(H) 272(H)  AST 5 - 34 U/L 36(H) 32 45(H)  ALT 0 - 55 U/L 19 23 40   INITIAL tumor markers AFP 3.2 CEA > 10,000 CA 19-9 12,929.6  CEA 01/11/2015: 1809 07/05/2015: 386 10/03/2015: 66.4 02/19/2016: 155.8 05/06/2016: 566 07/08/2016: 834 08/05/2016: 1102 10/07/2016: 1733 11/06/2016: 2214.95 12/16/2016: 2858.90 01/06/2017: 3495 02/03/17: 7247  PATHOLOGY Liver, needle/core biopsy 11/21/2014 - METASTATIC ADENOCARCINOMA, SEE COMMENT. Microscopic Comment The adenocarcinoma demonstrates the following immunophenotype: Cytokeratin 7 - negative expression. Cytokeratin 20 - strong diffuse expression. CD2 - strong diffuse expression. Overall the morphology and immunophenotype are that of  metastatic adenocarcinoma primary to colorectum. The recent nuclear medicine scan demonstrating sigmoid mass with associated liver masses is noted.  [facility] test result:    RADIOGRAPHIC STUDIES: I have personally reviewed the outside CT scan image with patient and his son.   CT CAP w Contrast 02/03/2017 IMPRESSION: Chest Impression:  1. Interval increase in size of pulmonary nodules as well as several new pulmonary nodules consistent with progression of pulmonary metastasis.  Abdomen / Pelvis Impression:  1. Interval increase in size of hepatic metastasis within LEFT and RIGHT hepatic lobe. 2. 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 Enhanced Submucosal collections in the proximal sigmoid colon are concerning for colorectal carcinoma recurrence. Resolution of descending colitis.   ASSESSMENT & PLAN:  71 y.o. [place] male, with past history of hypertension and dilated nonischemic gammopathy with EF 25%, no clinical signs of heart failure, who was found to have hepatitis B infection lately, and multiple liver lesions on the CT scan. He has extremely high CEA and CA 19-9 levels. PET scan reviewed a hypermetabolic sigmoid colon mass, diffuse liver metastasis, probable lung and adrenal gland metastasis.  1. Metastatic sigmoid colon cancer, with diffuse liver,  lungs, node and left adrenal gland metastases. KRAS/NRAS wild type, MSI-stable -Previous Liver biopsy showed metastatic adenocarcinoma. His tumor were strongly positive for CK20 and CD2, consistent with primary colorectal primary. KRAS and NRAS mutations were not detected.  -Pt understands that this is incurable cancer, and he has very high disease burden and overall prognosis is poor. The treatment goal is palliative -I previously discussed his restaging CT from 08/19/2016 which showed overall stable disease in liver, a few small lung nodules, slightly bigger, will continue monitoring. - his previous scan showed  overall some improvement since we restarted on 5-FU in August 2017. -His tumor marker CEA has been trending up, concerning for slow disease progression, we'll continue monitoring -I previously reviewed the CT scan from 02/03/2017 with the patient in detail, images reviewed in person. Unfortunately he has had significant disease progression in the liver -I recommend him to stop FOLFIRI due to disease progression. -He previously had a good response and tolerance to FOLFOX, oxaliplatin was held after he is recurrent GI bleeding. He does not have significant residual neuropathy. I recommended him to try FOLFOX again. -He has had several incidents of pulling the needle from his port at evening, 5-fu pump infusion is not feasible anymore  -He has started K proximal 3 weeks ago, tolerating moderately well, seems to be more fatigue, we'll continue low-dose oxaliplatin and Xeloda, and panitumumab  -Will follow up closely, repeat scan in 2 months to evaluate his response  -I suggested reducing his Xeloda to 1028m in the am and pm, but patient says he is fine to continue same dose of Xeloda (15051mbid) -Labs reviewed and adequate for cycle 2  2. Grade 1-2 skin rashes, stable  -Secondary to panitumumab, stable overall  -continue hydrocortisone 2.5%, and clindamycin gel 1%  twice daily as needed  -He knows to avoid sun exposure, and call me if it gets worse.  3. Gastric ulcer with significant GI bleeding in April and Aug 2016 -He is on PPI, continue once daily  -Repeat his EGD on 08/15/2015 showed near complete healing of his gastric ulcer -continue Nadolol 2076mid, per Dr. StaFuller PlanI previously encourage him to follow-up [facility] since he has restarted 5-fu   4. Type 2 DM  -His glucose level has increased lately, it was 267 on 12/02/16. I encouraged him to watch his diet, avoid any sweets, and monitor closely at home -I previously encouraged him to follow-up with his  primary care physician Dr. WilJimmye Normane is in the process to change his primary care physician. -I previously recommended him to increase metformin to 1000 mg twice daily, to better control his hyperglycemia.  5. HTN, Dilated nonischemic ischemia cardiomyopathy with EF 25% -He is clinically doing well without symptoms of CHF. However this is probably going to impact his chemotherapy.Will try to avoid cardiotoxic chemotherapy agent and avoid fluid overload during chemotherapy. -Continue follow-up with cardiology. -will hold on his lisinopril-HCTZ for now, his BP has been normal lately (02/03/17)  6 Hepatitis B carrier, with mild portal hypertension  -Per liver clinic, no need for treatment. Follow-up with Dr. StaSilvio Pate7. Malnutrition -I previously encouraged him to eat more, and take supplements as needed. -improved, gained weight back after I reduce his chemotherapy dose  -follow up with Dietitian   8. Anemia secondary to GI bleeding, iron deficiency and chemo  -Repeat lab on 06/06/2015 showed ferritin 117, serum iron 24, saturation 8%, which supports iron deficiency -He  received IV Feraheme again in Aug 2016 after GI bleeding  -Repeat a ferritin was 273 on 10/03/2015, much improved  -he received iv feraheme again on 11/27/2015 and 12/04/2015, but anemia did not improve much. -His anemia has been slightly worse lately, probably related to chemotherapy, we'll close monitor any signs of GI bleeding. -Blood transfusion if hemoglobin less than 8 or symptomatic anemia with hemoglobin 8-9 -We previously discussed that the patient does not need a refill on his Folic Acid since he is not drinking alcohol anymore.  9. hypomagnesemia  -secondary to panitumumab -he receives IV mag 6 g every week  -He is taking magnesium pill 2 tablets 3 times a day -continue monitoring   10. Goal of care discussion  -We again discussed the incurable nature of his cancer, and the overall poor prognosis, especially if  he does not have good response to chemotherapy or progress on chemo -The patient understands the goal of care is palliative. -I recommend DNR/DNI, he wishes to be full code for now.   11. Confusion/memory loss -He presented today with slight confusion, not much speaking and some memory loss and fatigue -? Chemo brain versus underlying dementia  -We will closely monitor   Plan -Lab reviewed, we'll proceed oxaliplatin today, and he will start Xeloda 1500 mg twice daily, 2 weeks on, one-week off  -He will continue Panitumumab every 2 weeks -will schedule the following: Lab, flush, f/u and chemo oxaliplatin in 3 weeks -Magnesium infuion 3 hrs in 3, 4 and 5 weeks  -PANITUMUMAB in 6/4   All questions were answered. The patient knows to call the clinic with any problems, questions or concerns.  I spent 25 minutes counseling the patient face to face. The total time spent in the appointment was 30 minutes and more than 50% was on counseling.  This document serves as a record of services personally performed by Jason Merle, MD. It was created on her behalf by Joslyn Devon, a trained medical scribe. The creation of this record is based on the scribe's personal observations and the provider's statements to them. This document has been checked and approved by the attending provider.   I have reviewed the above documentation for accuracy and completeness and I agree with the above.   Jason Merle, MD 03/12/2017

## 2017-03-12 NOTE — Progress Notes (Signed)
Per [name] patient is to receive 4mg  or 2 hours worth of Magnesium today with his magnesium of 1.1.

## 2017-03-12 NOTE — Patient Instructions (Signed)
Implanted Port Home Guide An implanted port is a type of central line that is placed under the skin. Central lines are used to provide IV access when treatment or nutrition needs to be given through a person's veins. Implanted ports are used for long-term IV access. An implanted port may be placed because:  You need IV medicine that would be irritating to the small veins in your hands or arms.  You need long-term IV medicines, such as antibiotics.  You need IV nutrition for a long period.  You need frequent blood draws for lab tests.  You need dialysis.  Implanted ports are usually placed in the chest area, but they can also be placed in the upper arm, the abdomen, or the leg. An implanted port has two main parts:  Reservoir. The reservoir is round and will appear as a small, raised area under your skin. The reservoir is the part where a needle is inserted to give medicines or draw blood.  Catheter. The catheter is a thin, flexible tube that extends from the reservoir. The catheter is placed into a large vein. Medicine that is inserted into the reservoir goes into the catheter and then into the vein.  How will I care for my incision site? Do not get the incision site wet. Bathe or shower as directed by your health care provider. How is my port accessed? Special steps must be taken to access the port:  Before the port is accessed, a numbing cream can be placed on the skin. This helps numb the skin over the port site.  Your health care provider uses a sterile technique to access the port. ? Your health care provider must put on a mask and sterile gloves. ? The skin over your port is cleaned carefully with an antiseptic and allowed to dry. ? The port is gently pinched between sterile gloves, and a needle is inserted into the port.  Only "non-coring" port needles should be used to access the port. Once the port is accessed, a blood return should be checked. This helps ensure that the port  is in the vein and is not clogged.  If your port needs to remain accessed for a constant infusion, a clear (transparent) bandage will be placed over the needle site. The bandage and needle will need to be changed every week, or as directed by your health care provider.  Keep the bandage covering the needle clean and dry. Do not get it wet. Follow your health care provider's instructions on how to take a shower or bath while the port is accessed.  If your port does not need to stay accessed, no bandage is needed over the port.  What is flushing? Flushing helps keep the port from getting clogged. Follow your health care provider's instructions on how and when to flush the port. Ports are usually flushed with saline solution or a medicine called heparin. The need for flushing will depend on how the port is used.  If the port is used for intermittent medicines or blood draws, the port will need to be flushed: ? After medicines have been given. ? After blood has been drawn. ? As part of routine maintenance.  If a constant infusion is running, the port may not need to be flushed.  How long will my port stay implanted? The port can stay in for as long as your health care provider thinks it is needed. When it is time for the port to come out, surgery will be   done to remove it. The procedure is similar to the one performed when the port was put in. When should I seek immediate medical care? When you have an implanted port, you should seek immediate medical care if:  You notice a bad smell coming from the incision site.  You have swelling, redness, or drainage at the incision site.  You have more swelling or pain at the port site or the surrounding area.  You have a fever that is not controlled with medicine.  This information is not intended to replace advice given to you by your health care provider. Make sure you discuss any questions you have with your health care provider. Document  Released: 10/14/2005 Document Revised: 03/21/2016 Document Reviewed: 06/21/2013 Elsevier Interactive Patient Education  2017 [employer]  

## 2017-03-12 NOTE — Patient Instructions (Signed)
[facility] Discharge Instructions for Patients Receiving Chemotherapy  Today you received the following chemotherapy agents: Oxaliplatin   To help prevent nausea and vomiting after your treatment, we encourage you to take your nausea medication as directed.    If you develop nausea and vomiting that is not controlled by your nausea medication, call the clinic.   BELOW ARE SYMPTOMS THAT SHOULD BE REPORTED IMMEDIATELY:  *FEVER GREATER THAN 100.5 F  *CHILLS WITH OR WITHOUT FEVER  NAUSEA AND VOMITING THAT IS NOT CONTROLLED WITH YOUR NAUSEA MEDICATION  *UNUSUAL SHORTNESS OF BREATH  *UNUSUAL BRUISING OR BLEEDING  TENDERNESS IN MOUTH AND THROAT WITH OR WITHOUT PRESENCE OF ULCERS  *URINARY PROBLEMS  *BOWEL PROBLEMS  UNUSUAL RASH Items with * indicate a potential emergency and should be followed up as soon as possible.  Feel free to call the clinic you have any questions or concerns. The clinic phone number is [phone].  Please show the [place] at check-in to the Emergency Department and triage nurse.  Hypomagnesemia Hypomagnesemia is a condition in which the level of magnesium in the blood is low. Magnesium is a mineral that is found in many foods. It is used in many different processes in the body. Hypomagnesemia can affect every organ in the body. It can cause life-threatening problems. What are the causes? Causes of hypomagnesemia include:  Not getting enough magnesium in your diet.  Malnutrition.  Problems with absorbing magnesium from the intestines.  Dehydration.  Alcohol abuse.  Vomiting.  Severe diarrhea.  Some medicines, including medicines that make you urinate more.  Certain diseases, such as kidney disease, diabetes, and overactive thyroid. What are the signs or symptoms?  Involuntary shaking or trembling of a body part (tremor).  Confusion.  Muscle weakness.  Sensitivity to light, sound, and touch.  Psychiatric  issues, such as depression, irritability, or psychosis.  Sudden tightening of muscles (muscle spasms).  Tingling in the arms and legs.  A feeling of fluttering of the heart. These symptoms are more severe if magnesium levels drop suddenly. How is this diagnosed? To make a diagnosis, your health care provider will do a physical exam and order blood and urine tests. How is this treated? Treatment will depend on the cause and the severity of your condition. It may involve:  A magnesium supplement. This can be taken in pill form. It can also be given through an IV tube. This is usually done if the condition is severe.  Changes to your diet. You may be directed to eat foods that have a lot of magnesium, such as green leafy vegetables, peas, beans, and nuts.  Eliminating alcohol from your diet. Follow these instructions at home:  Include foods with magnesium in your diet. Foods that are rich in magnesium include green vegetables, beans, nuts and seeds, and whole grains.  Take medicines only as directed by your health care provider.  Take magnesium supplements if your health care provider instructs you to do that. Take them as directed.  Have your magnesium levels monitored as directed by your health care provider.  When you are active, drink fluids that contain electrolytes.  Keep all follow-up visits as directed by your health care provider. This is important. Contact a health care provider if:  You get worse instead of better.  Your symptoms return. Get help right away if:  Your symptoms are severe. This information is not intended to replace advice given to you by your health care provider. Make sure you  discuss any questions you have with your health care provider. Document Released: 07/10/2005 Document Revised: 03/21/2016 Document Reviewed: 05/30/2014 Elsevier Interactive Patient Education  2017 [employer].

## 2017-03-17 ENCOUNTER — Ambulatory Visit (HOSPITAL_BASED_OUTPATIENT_CLINIC_OR_DEPARTMENT_OTHER): Payer: Medicare Other

## 2017-03-17 VITALS — BP 102/64 | HR 102 | Temp 99.2°F | Resp 16

## 2017-03-17 DIAGNOSIS — C787 Secondary malignant neoplasm of liver and intrahepatic bile duct: Secondary | ICD-10-CM

## 2017-03-17 DIAGNOSIS — Z5112 Encounter for antineoplastic immunotherapy: Secondary | ICD-10-CM | POA: Diagnosis not present

## 2017-03-17 DIAGNOSIS — C187 Malignant neoplasm of sigmoid colon: Secondary | ICD-10-CM

## 2017-03-17 MED ORDER — SODIUM CHLORIDE 0.9 % IV SOLN
6.3000 mg/kg | Freq: Once | INTRAVENOUS | Status: AC
  Administered 2017-03-17: 400 mg via INTRAVENOUS
  Filled 2017-03-17: qty 20

## 2017-03-17 MED ORDER — SODIUM CHLORIDE 0.9 % IV SOLN
Freq: Once | INTRAVENOUS | Status: AC
  Administered 2017-03-17: 09:00:00 via INTRAVENOUS

## 2017-03-17 MED ORDER — SODIUM CHLORIDE 0.9 % IJ SOLN
10.0000 mL | INTRAMUSCULAR | Status: DC | PRN
  Administered 2017-03-17: 10 mL
  Filled 2017-03-17: qty 10

## 2017-03-17 MED ORDER — HEPARIN SOD (PORK) LOCK FLUSH 100 UNIT/ML IV SOLN
500.0000 [IU] | Freq: Once | INTRAVENOUS | Status: AC | PRN
  Administered 2017-03-17: 500 [IU]
  Filled 2017-03-17: qty 5

## 2017-03-17 MED FILL — CAPECITABINE 500 MG TABLET: 500 | 14 days supply | Qty: 84 | Fill #2

## 2017-03-17 NOTE — Patient Instructions (Signed)
[facility] Discharge Instructions for Patients Receiving Chemotherapy  Today you received the following chemotherapy agents Vectibix  To help prevent nausea and vomiting after your treatment, we encourage you to take your nausea medication as directed.   If you develop nausea and vomiting that is not controlled by your nausea medication, call the clinic.   BELOW ARE SYMPTOMS THAT SHOULD BE REPORTED IMMEDIATELY:  *FEVER GREATER THAN 100.5 F  *CHILLS WITH OR WITHOUT FEVER  NAUSEA AND VOMITING THAT IS NOT CONTROLLED WITH YOUR NAUSEA MEDICATION  *UNUSUAL SHORTNESS OF BREATH  *UNUSUAL BRUISING OR BLEEDING  TENDERNESS IN MOUTH AND THROAT WITH OR WITHOUT PRESENCE OF ULCERS  *URINARY PROBLEMS  *BOWEL PROBLEMS  UNUSUAL RASH Items with * indicate a potential emergency and should be followed up as soon as possible.  Feel free to call the clinic you have any questions or concerns. The clinic phone number is [phone].  Please show the CHEMO ALERT CARD at check-in to the Emergency Department and triage nurse.   

## 2017-03-17 NOTE — Progress Notes (Signed)
OK to treat with HR 102, Temp 99.2, and last magnesium result per MD Burr Medico.

## 2017-03-19 ENCOUNTER — Ambulatory Visit (HOSPITAL_BASED_OUTPATIENT_CLINIC_OR_DEPARTMENT_OTHER): Payer: Medicare Other

## 2017-03-19 VITALS — BP 96/64 | HR 95 | Temp 98.7°F | Resp 16

## 2017-03-19 DIAGNOSIS — Z95828 Presence of other vascular implants and grafts: Secondary | ICD-10-CM

## 2017-03-19 DIAGNOSIS — D509 Iron deficiency anemia, unspecified: Secondary | ICD-10-CM

## 2017-03-19 MED ORDER — SODIUM CHLORIDE 0.9 % IV SOLN
Freq: Once | INTRAVENOUS | Status: AC
  Administered 2017-03-19: 09:00:00 via INTRAVENOUS
  Filled 2017-03-19: qty 250

## 2017-03-19 MED ORDER — SODIUM CHLORIDE 0.9 % IJ SOLN
10.0000 mL | INTRAMUSCULAR | Status: DC | PRN
  Administered 2017-03-19: 10 mL via INTRAVENOUS
  Filled 2017-03-19: qty 10

## 2017-03-19 MED ORDER — HEPARIN SOD (PORK) LOCK FLUSH 100 UNIT/ML IV SOLN
500.0000 [IU] | Freq: Once | INTRAVENOUS | Status: AC | PRN
  Administered 2017-03-19: 500 [IU] via INTRAVENOUS
  Filled 2017-03-19: qty 5

## 2017-03-19 NOTE — Patient Instructions (Signed)
Magnesium Sulfate injection ?y l thu?c g? MAGNESIUM SULFATE l ch?t ?i?n gi?i d?ng tim th??ng ???c dng ?? ?i?u tr? ch?ng magnesium th?p ? trong mu. Thu?c c?ng ???c dng ?? phng ng?a hay ki?m sot cc c?n co gi?t ? ph? n? b? ch?ng ti?n kinh gi?t/s?n gi?t hay b? ch?ng kinh gi?t/s?n gi?t. Thu?c ny c th? ???c dng cho nh?ng m?c ?ch khc; hy h?i ng??i cung c?p d?ch v? y t? ho?c d??c s? c?a mnh, n?u qu v? c th?c m?c. Ti c?n ph?i bo cho ng??i cung c?p d?ch v? y t? c?a mnh ?i?u g tr??c khi dng thu?c ny? H? c?n bi?t li?u qu v? c b?t k? tnh tr?ng no sau ?y khng: -b?nh tim -ti?n s? tim ??p khng ??u -b?nh th?n -pha?n ??ng b?t th???ng ho??c di? ??ng v??i magnesium sulfate ho?c cc lo?i thu?c -pha?n ??ng b?t th???ng ho??c di? ??ng v??i th??c ph?m, thu?c nhu?m, ho??c ch?t ba?o qua?n -?ang c thai ho??c ??nh co? thai -?ang cho con bu? Ti nn s? d?ng thu?c ny nh? th? no? Thu?c ny ?? truy?n vo t?nh m?ch. Thu?c ny ???c s? d?ng b?i chuyn vin y t? ? b?nh vi?n ho?c ? phng m?ch. Hy bn v?i bc s? nhi khoa c?a qu v? v? vi?c dng thu?c ny ? tr? em. Thu?c ny c th? ???c k toa trong nh?ng tr??ng h?p ch?n l?c, nh?ng c?n ph?i th?n tr?ng. Qu li?u: N?u qu v? cho r?ng mnh ? dng qu nhi?u thu?c ny, th hy lin l?c v?i trung tm ki?m sot ch?t ??c ho?c phng c?p c?u ngay l?p t?c. L?U : Thu?c ny ch? dnh ring cho qu v?. Khng chia s? thu?c ny v?i nh?ng ng??i khc. N?u ti l? qun m?t li?u th sao? ?i?u ny khng p d?ng. Nh?ng g c th? t??ng tc v?i thu?c ny? Thu?c ny c th? t??ng tc v?i cc thu?c sau ?y: -m?t s? thu?c dng cho ch?ng lo u ho?c cc v?n ?? v? gi?c ng? -m?t s? thu?c dng cho cc ch?ng co gi?t, ch?ng h?n nh? phenobarbital -digoxin -cc thu?c lm gin c? ?? gi?i ph?u -cc thu?c gi?m ?au gy ng? (narcotic) Danh sch ny c th? khng m t? ?? h?t cc t??ng tc c th? x?y ra. Hy ??a cho ng??i cung c?p d?ch v? y t? c?a mnh danh sch t?t c? cc  thu?c, th?o d??c, cc thu?c khng c?n toa, ho?c cc ch? ph?m b? sung m qu v? dng. C?ng nn bo cho h? bi?t r?ng qu v? c ht thu?c, u?ng r??u, ho?c c s? d?ng ma ty tri php hay khng. Vi th? c th? t??ng tc v?i thu?c c?a qu v?. Ti c?n ph?i theo di ?i?u g trong khi dng thu?c ny? Qu v? s? ???c theo di ch?t ch? trong khi dng thu?c ny. Qu v? c th? c?n ph?i ???c lm xt nghi?m mu trong th?i gian dng thu?c ny. Ti c th? nh?n th?y nh?ng tc d?ng ph? no khi dng thu?c ny? Nh?ng tc d?ng ph? qu v? c?n ph?i bo cho bc s? ho?c chuyn vin y t? cng s?m cng t?t: -cc ph?n ?ng d? ?ng, ch?ng h?n nh? da b? m?n ??, ng?a, n?i my ?ay, s?ng ? m?t, mi, ho?c l??i -ph?ng m?t -y?u c? b?p -cc d?u hi?u v tri?u ch?ng huy?t p th?p, ch?ng h?n nh? chng m?t; c?m th?y chong vng ho?c ng?t x?u, b? ng; c?m th?y y?u ?t ho?c m?t m?i khc th??ng -cc d?u hi?u v tri?u  ch?ng c?a s? thay ??i nguy hi?m c?a nh?p tim, ch?ng h?n nh? ?au ng?c, chng m?t, nh?p tim nhanh ho?c khng ??u, ?nh tr?ng ng?c; cc v?n ?? v? h h?p -v m? hi Dan[facility] sch ny c th? khng m t? ?? h?t cc tc d?ng ph? c th? x?y ra. Xin g?i t?i bc s? c?a mnh ?? ???c c? v?n chuyn mn v? cc tc d?ng ph?Sander Nephew v? c th? t??ng trnh cc tc d?ng ph? cho FDA theo s? [phone]. Ti nn c?t gi? thu?c c?a mnh ? ?u? Thu?c ny ???c s? d?ng b?i chuyn vin y t? ? b?nh vi?n ho?c ? phng m?ch. Qu v? s? khng ???c c?p thu?c ny ?? c?t gi? t?i nh. L?U : ?y l b?n tm t?t. N c th? khng bao hm t?t c? thng tin c th? c. N?u qu v? th?c m?c v? thu?c ny, xin trao ??i v?i bc s?, d??c s?, ho?c ng??i cung c?p d?ch v? y t? c?a mnh.  2018 Elsevier/Gold Standard (2016-11-14 00:00:00)

## 2017-03-26 ENCOUNTER — Ambulatory Visit (HOSPITAL_BASED_OUTPATIENT_CLINIC_OR_DEPARTMENT_OTHER): Payer: Medicare Other

## 2017-03-26 VITALS — BP 133/73 | HR 80 | Temp 98.4°F | Resp 19

## 2017-03-26 DIAGNOSIS — Z95828 Presence of other vascular implants and grafts: Secondary | ICD-10-CM

## 2017-03-26 DIAGNOSIS — D509 Iron deficiency anemia, unspecified: Secondary | ICD-10-CM

## 2017-03-26 MED ORDER — MAGNESIUM SULFATE 2 GM/50ML IV SOLN
6.0000 g | Freq: Once | INTRAVENOUS | Status: AC
  Administered 2017-03-26: 6 g via INTRAVENOUS
  Filled 2017-03-26: qty 150

## 2017-03-26 MED ORDER — HEPARIN SOD (PORK) LOCK FLUSH 100 UNIT/ML IV SOLN
500.0000 [IU] | Freq: Once | INTRAVENOUS | Status: AC | PRN
  Administered 2017-03-26: 500 [IU] via INTRAVENOUS
  Filled 2017-03-26: qty 5

## 2017-03-26 MED ORDER — SODIUM CHLORIDE 0.9 % IV SOLN: Freq: Once | INTRAVENOUS | Status: DC

## 2017-03-26 MED ORDER — SODIUM CHLORIDE 0.9 % IJ SOLN
10.0000 mL | INTRAMUSCULAR | Status: DC | PRN
  Administered 2017-03-26: 10 mL via INTRAVENOUS
  Filled 2017-03-26: qty 10

## 2017-03-26 NOTE — Patient Instructions (Signed)
Magnesium Sulfate injection What is this medicine? MAGNESIUM SULFATE (mag NEE zee um SUL fate) is an electrolyte injection commonly used to treat low magnesium levels in your blood. It is also used to prevent or control seizures in women with preeclampsia or eclampsia. This medicine may be used for other purposes; ask your health care provider or pharmacist if you have questions. What should I tell my health care provider before I take this medicine? They need to know if you have any of these conditions: -heart disease -history of irregular heart beat -kidney disease -an unusual or allergic reaction to magnesium sulfate, medicines, foods, dyes, or preservatives -pregnant or trying to get pregnant -breast-feeding How should I use this medicine? This medicine is for infusion into a vein. It is given by a health care professional in a hospital or clinic setting. Talk to your pediatrician regarding the use of this medicine in children. While this drug may be prescribed for selected conditions, precautions do apply. Overdosage: If you think you have taken too much of this medicine contact a poison control center or emergency room at once. NOTE: This medicine is only for you. Do not share this medicine with others. What if I miss a dose? This does not apply. What may interact with this medicine? This medicine may interact with the following medications: -certain medicines for anxiety or sleep -certain medicines for seizures like phenobarbital -digoxin -medicines that relax muscles for surgery -narcotic medicines for pain This list may not describe all possible interactions. Give your health care provider a list of all the medicines, herbs, non-prescription drugs, or dietary supplements you use. Also tell them if you smoke, drink alcohol, or use illegal drugs. Some items may interact with your medicine. What should I watch for while using this medicine? Your condition will be monitored carefully  while you are receiving this medicine. You may need blood work done while you are receiving this medicine. What side effects may I notice from receiving this medicine? Side effects that you should report to your doctor or health care professional as soon as possible: -allergic reactions like skin rash, itching or hives, swelling of the face, lips, or tongue -facial flushing -muscle weakness -signs and symptoms of low blood pressure like dizziness; feeling faint or lightheaded, falls; unusually weak or tired -signs and symptoms of a dangerous change in heartbeat or heart rhythm like chest pain; dizziness; fast or irregular heartbeat; palpitations; breathing problems -sweating This list may not describe all possible side effects. Call your doctor for medical advice about side effects. You may report side effects to FDA at 1-800-FDA-1088. Where should I keep my medicine? This drug is given in a hospital or clinic and will not be stored at home. NOTE: This sheet is a summary. It may not cover all possible information. If you have questions about this medicine, talk to your doctor, pharmacist, or health care provider.  2018 Elsevier/Gold Standard (2016-05-01 12:31:42)  

## 2017-03-27 NOTE — Progress Notes (Signed)
Jason Reilly  [phone] [phone]  Clinic follow up Note   Patient Care Team: [name], MD as PCP - General (Specialist) [name], CRNP as Nurse Practitioner (Nurse Practitioner) Truitt Merle, MD as Consulting Physician (Hematology) Ladene Artist, MD as Consulting Physician (Gastroenterology) 04/02/2017  CHIEF COMPLAINTS Follow up metastatic sigmoid colon cancer  Oncology History   He is a Metastatic colon cancer to liver   Staging form: Colon and Rectum, AJCC 7th Edition     Clinical: Stage Unknown (May, NX, M1) - Unsigned      Cancer of sigmoid colon ([place])   10/28/2014 Tumor Marker    AFP 3.2 CEA > 10,000 CA 19-9 12,929.6. tumor (-) KRAS and NRAS mutation.       11/10/2014 Imaging    PET scan showed hypermetabolic mass in the sigmoid colon was noted metastasis in the retroperitoneum. Probable left adrenal and pulmonary metastasis, and diffuse liver metastasis.      11/21/2014 Initial Diagnosis    Metastatic colon cancer to liver, lung, abd nodes and left adrenal gland. Diagnosis was made by liver biopsy.       11/30/2014 - 05/23/2015 Chemotherapy    First line chemo [id number], Panitumumab added from second cycle, chemo held due to recurrent GI bleeding        12/28/2014 - 12/31/2014 Hospital Admission    Was admitted for dehydration, neutropenia fever with UTI, and severe skin rash.      02/08/2015 - 02/12/2015 Hospital Admission    He was admitted for upper GI bleeding, e.g. showed a gastric ulcer with clots, status post appendectomy injection. He also received a blood transfusion.      03/01/2015 Tumor Marker    CEA 694, CA19.9 462      03/13/2015 Imaging    CT CAP showed partial response, no new lesions.       05/31/2015 - 06/02/2015 Hospital Admission    He was admitted to [facility] in [place] due to upper GI bleeding, EGD showed gastric and dudenal ulcers       06/27/2015 - 06/29/2015 Hospital Admission    he was  admitted for dairrhea and pancolitis, c-diff and stool cultures were negative, treated with antibiotics      07/12/2015 -  Chemotherapy    panitumumab 78m/kg, every 2 weeks      09/19/2015 -  Chemotherapy    Irinotecan 1865mm2 every 2 weeks, dose reduction due to diarrhea. 5-Fu added on 06/10/2016 due to slight disease progression.   Due to his prolonged chemotherapy, I'll reduce his chemotherapy dose.  He will take Xeloda 150054mwice a day for 2 weeks, with the third week off. The patient will continue with oxali infusions every 3 weeks; Dose reduction to 80m65m for this cycles. Started 02/17/17.      11/08/2015 - 11/10/2015 Hospital Admission    Pt was admitted for sepsis and hypotension, was found to have (+) influenza A and treated.       11/30/2015 Imaging     CT scans reviewed continued improvement in the liver metastasis, no other new lesions.      05/06/2016 Tumor Marker    CEA has gradually increased from 110 in March 2017 to 566 in July 2017      06/04/2016 Imaging    Restaging CT scan showed similar to mild increase in hepatic metastasis, mild enlargement of right adrenal nodule, primary similar pulmonary nodules. A left upper lobe nodule has resolved.  11/12/2016 Imaging    CT CAP - Restaging IMPRESSION: 1. No significant change in the appearance of multifocal pulmonary nodules. 2. Previously referenced partially calcified liver metastases are stable from previous exam. Within the posterior right lobe of liver there is a metastasis which appears slightly increased in size from previous exam. 3. Mild decrease in size of right adrenal nodule. 4. There is mild wall thickening and inflammation involving the descending colon and proximal sigmoid colon worrisome for colitis. A small intramural fluid collection is identified which is unchanged from 08/19/2016, significance unknown. 5. Aortic atherosclerosis.      02/03/2017 Imaging    CT CAP w Contrast Chest  Impression: 1. Interval increase in size of pulmonary nodules as well as several new pulmonary nodules consistent with progression of pulmonary metastasis.  Abdomen / Pelvis Impression: 1. Interval increase in size of hepatic metastasis within LEFT and RIGHT hepatic lobe. 2. 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 Enhanced Submucosal collections in the proximal sigmoid colon are concerning for colorectal carcinoma recurrence. Resolution of descending colitis.       HISTORY OF PRESENTING ILLNESS:  Jason Reilly 71 y.o. male is here because of abnormal CT findings, which is very suspicious for malignancy. He is on ranitidine from [place], has been on in the [place] for 16 years. He came in with his son and an interpreter.  He has been feeling fatigued since two month ago. He is still able to do all ADLs. He otherwise denies any pain, bloating or nausea.  He lost about 20lbs in 3 month. His appetite is lower than before, eats less, no change of his bowl habits.  She denied any hematochezia or melana. Per his son, he has had some personality changes daily, irritable, slightly confused some time.  He was evaluated by his primary care physician. Lab test reviewed hepatitis B infection, which he did not know before, and elevated alkaline phosphatase, his liver function and the rest of the liver function was not remarkable. [place] of abdomen was obtained on 07/22/2014, which showed diffusely abnormal liver with multiple echogenic lesions. CT of abdomen with and without contrast was done on 08/26/2014, which reviewed here at a medically with multiple large partially calcified hepatic masses consistent with metastatic disease. Mild retroperitoneal adenopathy with the largest node measuring 1.6 cm. And nonspecific 1.4 cm left adrenal nodule was also noticed. His tumor marker showed CEA greater than 10,000, CA 19-9 12,929, AFP 3.2 (normal). He was referred to [facility] and was  evaluated by nurse practitioner [name]. Treatment for hepatitis B was not recommended based on his virus load.  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 He was evaluated by a cardiologist in 2014. He denies any significant dyspnea on exertion. No leg swollen.  CURRENT THERAPY:  1. panitumumab 39m/kg, every 2 weeks, started on 07/12/2015 2. CAPOX with dose reduction every 3 weeks started on 02/17/2017 3. He also receives IV magnesium 4-6g weekly 4. Xeloda 1500 mg twice daily for two weeks on and 1 week off starting 02/17/17   INTERIM HISTORY:  PMavricreturns for follow-up and chemotherapy cycle 3 of Oxali. He presents to the clinic today with translator and son. He reports to doing OK. His son and wife monitor his medication. He reports this is his 2nd week. He does not eat a lot. He is losing weight slightly. His wife helps in the bath. He does nothing at home mostly. Chemo did not  make him sick last time. His son reports his father is getting weaker and will walk around the house and goes to church. He can tell his father is losing weight because he does not want to eat at all. He drink ensure once a morning. He also takes vitamins. He reports everything is good overall.      MEDICAL HISTORY:  Past Medical History:  Diagnosis Date  . Abnormal EKG   . Diabetes mellitus without complication (HCC)    metformin  . Gastric ulcer   . H. pylori infection   . Hepatitis B   . Hypertension   . Metastatic colon cancer to liver ([place])   . Non-ischemic cardiomyopathy ([place])   . Tachycardia     SURGICAL HISTORY: Past Surgical History:  Procedure Laterality Date  . ESOPHAGOGASTRODUODENOSCOPY N/A 02/08/2015   Procedure: ESOPHAGOGASTRODUODENOSCOPY (EGD);  Surgeon: Ladene Artist, MD;  Location: [facility];  Service: Endoscopy;  Laterality: N/A;  . ESOPHAGOGASTRODUODENOSCOPY (EGD) WITH PROPOFOL N/A 08/15/2015   Procedure: ESOPHAGOGASTRODUODENOSCOPY  (EGD) WITH PROPOFOL;  Surgeon: Ladene Artist, MD;  Location: [facility];  Service: Endoscopy;  Laterality: N/A;  . LEFT AND RIGHT HEART CATHETERIZATION WITH CORONARY ANGIOGRAM N/A 03/29/2013   Procedure: LEFT AND RIGHT HEART CATHETERIZATION WITH CORONARY ANGIOGRAM;  Surgeon: Pixie Casino, MD;  Location: [facility] CATH LAB;  Service: Cardiovascular;  Laterality: N/A;  . Nuclear Stress Test  03/03/2013   High risk - consistent with nonischemic cardiomyopathy    SOCIAL HISTORY: Social History   Social History  . Marital status: Married    Spouse name: N/A  . Number of children: N/A  . Years of education: N/A   Occupational History  . Not on file.   Social History Main Topics  . Smoking status: Former Smoker    Types: Cigarettes    Quit date: 11/07/2008  . Smokeless tobacco: Never Used  . Alcohol use Yes     Comment: occasional  . Drug use: No  . Sexual activity: No   Other Topics Concern  . Not on file   Social History Narrative   Married   Enjoys walking   Has lived in [place] > 16 years    FAMILY HISTORY: No family history of liver disease or malignancy.  ALLERGIES:  has No Known Allergies.  MEDICATIONS:  Current Outpatient Prescriptions on File Prior to Visit  Medication Sig Dispense Refill  . clindamycin (CLINDAGEL) 1 % gel Apply topically 2 (two) times daily. 30 g 2  . folic acid (FOLVITE) 1 MG tablet Take 1 tablet (1 mg total) by mouth daily. 30 tablet 3  . hydrocortisone 1 % ointment Apply 1 application topically 2 (two) times daily. 56 g 0  . lidocaine-prilocaine (EMLA) cream Apply 1 application topically as needed. Apply to [facility] cath at least one and a half hour to two hours  before needle stick as needed. 30 g 2  . lisinopril-hydrochlorothiazide (PRINZIDE,ZESTORETIC) 10-12.5 MG tablet     . magnesium oxide (MAG-OX) 400 (241.3 Mg) MG tablet Take 1 tablet (400 mg total) by mouth 3 (three) times daily. 90 tablet 3  . metFORMIN (GLUCOPHAGE) 500 MG tablet Take 1 tablet  (500 mg total) by mouth 2 (two) times daily with a meal. 60 tablet 0  . nadolol (CORGARD) 20 MG tablet Take 1 tablet (20 mg total) by mouth 2 (two) times daily. THIS IS A ONE TIME ORDER.  FUTURE REFILLS NEED TO BE DONE BY PRIMARY MD. 60 tablet 1  . potassium  chloride SA (K-DUR,KLOR-CON) 20 MEQ tablet Take 1 tablet (20 mEq total) by mouth daily. 14 tablet 0  . diphenoxylate-atropine (LOMOTIL) 2.5-0.025 MG tablet Take 1-2 tablets by mouth 4 (four) times daily as needed for diarrhea or loose stools. (Patient not taking: Reported on 02/17/2017) 30 tablet 0  . loperamide (IMODIUM) 2 MG capsule Take 2 capsules (4 mg total) by mouth 4 (four) times daily as needed for diarrhea or loose stools (NO MORE THAN 8 TABLETS PER DAY.). (Patient not taking: Reported on 03/12/2017) 60 capsule 3  . magic mouthwash SOLN Take 5 mLs by mouth 4 (four) times daily. (Patient not taking: Reported on 03/12/2017) 120 mL 1  . ondansetron (ZOFRAN) 8 MG tablet Take 1 tablet (8 mg total) by mouth every 8 (eight) hours as needed for nausea or vomiting. (Patient not taking: Reported on 03/12/2017) 45 tablet 2  . prochlorperazine (COMPAZINE) 10 MG tablet Take 1 tablet (10 mg total) by mouth every 6 (six) hours as needed for nausea or vomiting. (Patient not taking: Reported on 03/12/2017) 30 tablet 3   Current Facility-Administered Medications on File Prior to Visit  Medication Dose Route Frequency Provider Last Rate Last Dose  . 0.9 %  sodium chloride infusion   Intravenous Once Truitt Merle, MD      . ferumoxytol [facility]) 510 mg in sodium chloride 0.9 % 100 mL IVPB  510 mg Intravenous Once Truitt Merle, MD      . sodium chloride 0.9 % injection 10 mL  10 mL Intracatheter PRN Truitt Merle, MD   10 mL at 10/03/15 1705  . sodium chloride 0.9 % injection 10 mL  10 mL Intravenous PRN Truitt Merle, MD   10 mL at 05/14/16 1127   REVIEW OF SYSTEMS:   Constitutional: Denies fevers, chills or abnormal night sweats (+) increased weight loss (+) increased  fatigue (+) loss of appetite (+) memory loss Eyes: Denies blurriness of vision, double vision or watery eyes Ears, nose, mouth, throat, and face: Denies mucositis, sore throat, or dry mouth Respiratory: Denies cough, dyspnea or wheezes Cardiovascular: Denies palpitation, chest discomfort or lower extremity swelling Gastrointestinal: Denies nausea, heartburn or change in bladder/ bowel habits Skin: (+) itching at port site  Lymphatics: Denies new lymphadenopathy or easy bruising Neurological:Denies numbness, tingling or new weaknesses Behavioral/Psych: Mood is stable, no new changes  All other systems were reviewed with the patient and are negative.  PHYSICAL EXAMINATION: BP 117/66 (BP Location: Left Arm, Patient Position: Sitting)   Pulse (!) 101   Temp 98.5 F (36.9 C) (Oral)   Resp 18   Ht _0  (1.651 m)   Wt 138 lb 4.8 oz (62.7 kg)   SpO2 100%   BMI 23.01 kg/m    ECOG PERFORMANCE STATUS: 2 GENERAL:alert, no distress and comfortable SKIN: (+) Dry skin,  no skin ulcer, (+) scatter skin rashes on his neck and upper chest, some are resolving with skin pigmentation. No discharge. EYES: normal, conjunctiva are pink and non-injected, sclera clear OROPHARYNX:no exudate, no erythema and lips, buccal mucosa, and tongue with mild discoloration at the tip. The low front teeth are missing  NECK: supple, thyroid normal size, non-tender, without nodularity LYMPH:  no palpable lymphadenopathy in the cervical, axillary or inguinal LUNGS: clear to auscultation and percussion with normal breathing effort, no rales  HEART: regular rate & rhythm and no murmurs and no lower extremity edema ABDOMEN:abdomen soft, non-tender, no hepatomegaly, no splenomegaly and normal bowel sounds Musculoskeletal:no cyanosis of digits and no clubbing  PSYCH: alert & oriented x 3 with fluent speech NEURO: no focal motor/sensory deficits   LABORATORY DATA:  I have reviewed the data as listed CBC Latest Ref Rng &  Units 04/02/2017 03/12/2017 02/17/2017  WBC 4.0 - 10.3 10e3/uL 3.4(L) 7.8 5.2  Hemoglobin 13.0 - 17.1 g/dL 9.0(L) 9.5(L) 8.9(L)  Hematocrit 38.4 - 49.9 % 27.1(L) 28.8(L) 27.1(L)  Platelets 140 - 400 10e3/uL 115(L) 142 152   CMP Latest Ref Rng & Units 04/02/2017 03/12/2017 02/17/2017  Glucose 70 - 140 mg/dl 198(H) 136 135  BUN 7.0 - 26.0 mg/dL 13.5 11.8 10.3  Creatinine 0.7 - 1.3 mg/dL 0.8 0.8 0.9  Sodium 136 - 145 mEq/L 142 137 140  Potassium 3.5 - 5.1 mEq/L 4.0 4.2 5.0  Chloride 101 - 111 mmol/L - - -  CO2 22 - 29 mEq/L _0 Calcium 8.4 - 10.4 mg/dL 9.0 8.8 8.6  Total Protein 6.4 - 8.3 g/dL 8.2 8.5(H) 7.9  Total Bilirubin 0.20 - 1.20 mg/dL 0.92 1.64(H) 0.53  Alkaline Phos 40 - 150 U/L 107 123 186(H)  AST 5 - 34 U/L 32 36(H) 32  ALT 0 - 55 U/L _1 INITIAL tumor markers AFP 3.2 CEA > 10,000 CA 19-9 12,929.6  CEA 01/11/2015: 1809 07/05/2015: 386 10/03/2015: 66.4 02/19/2016: 155.8 05/06/2016: 566 07/08/2016: 834 08/05/2016: 1102 10/07/2016: 1733 11/06/2016: 2214.95 12/16/2016: 2858.90 01/06/2017: 3495 02/03/17: 7247 03/12/17: 5424.61 04/02/17: PENDING    PATHOLOGY Liver, needle/core biopsy 11/21/2014 - METASTATIC ADENOCARCINOMA, SEE COMMENT. Microscopic Comment The adenocarcinoma demonstrates the following immunophenotype: Cytokeratin 7 - negative expression. Cytokeratin 20 - strong diffuse expression. CD2 - strong diffuse expression. Overall the morphology and immunophenotype are that of metastatic adenocarcinoma primary to colorectum. The recent nuclear medicine scan demonstrating sigmoid mass with associated liver masses is noted.  [facility] test result:    RADIOGRAPHIC STUDIES: I have personally reviewed the outside CT scan image with patient and his son.   CT CAP w Contrast 02/03/2017 IMPRESSION: Chest Impression: 1. Interval increase in size of pulmonary nodules as well as several new pulmonary nodules consistent with progression of  pulmonary metastasis. Abdomen / Pelvis Impression: 1. Interval increase in size of hepatic metastasis within LEFT and RIGHT hepatic lobe. 2. 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 Enhanced Submucosal collections in the proximal sigmoid colon are concerning for colorectal carcinoma recurrence. Resolution of descending colitis.   ASSESSMENT & PLAN:  71 y.o. [place] male, with past history of hypertension and dilated nonischemic gammopathy with EF 25%, no clinical signs of heart failure, who was found to have hepatitis B infection lately, and multiple liver lesions on the CT scan. He has extremely high CEA and CA 19-9 levels. PET scan reviewed a hypermetabolic sigmoid colon mass, diffuse liver metastasis, probable lung and adrenal gland metastasis.  1. Metastatic sigmoid colon cancer, with diffuse liver, lungs, node and left adrenal gland metastases. KRAS/NRAS wild type, MSI-stable -Previous Liver biopsy showed metastatic adenocarcinoma. His tumor were strongly positive for CK20 and CD2, consistent with primary colorectal primary. KRAS and NRAS mutations were not detected.  -Pt understands that this is incurable cancer, and he has very high disease burden and overall prognosis is poor. The treatment goal is palliative -I previously discussed his restaging CT from 08/19/2016 which showed overall stable disease in liver, a few small lung nodules, slightly bigger, will continue monitoring. - his previous scan showed overall some improvement since we restarted on 5-FU in August 2017. -His tumor marker CEA has been  trending up, concerning for slow disease progression, we'll continue monitoring -I previously reviewed the CT scan from 02/03/2017 with the patient in detail, images reviewed in person. Unfortunately he has had significant disease progression in the liver -I recommend him to stop FOLFIRI due to disease progression. -He previously had a good response and tolerance to FOLFOX,  oxaliplatin was held after he is recurrent GI bleeding. He does not have significant residual neuropathy. I recommended him to try FOLFOX again. -He has had several incidents of pulling the needle from his port at evening, 5-fu pump infusion is not feasible anymore  -He has started CAPOX, tolerating moderately well, seems to be more fatigue, we'll continue low-dose oxaliplatin and Xeloda, and panitumumab  -Will follow up closely, repeat scan in 2 months to evaluate his response  -Labs previously reviewed and adequate for treatment, we'll proceed chemotherapy today. -To make his oral chemo on schedule with IV chemo, he will finish this cycle of Xeloda and will restart with his next visit  -Will continue with chemo and will move all treatments to Wednesdays -Will schedule next CT scan at next visit with me   2. Grade 1-2 skin rashes, stable  -Secondary to panitumumab, stable overall  -continue hydrocortisone 2.5%, and clindamycin gel 1%  twice daily as needed  -He knows to avoid sun exposure, and call me if it gets worse.  3. Gastric ulcer with significant GI bleeding in April and Aug 2016 -He is on PPI, continue once daily  -Repeat his EGD on 08/15/2015 showed near complete healing of his gastric ulcer -continue Nadolol 67m bid, per Dr. SFuller Plan- I previously encourage him to follow-up with [facility] has [facility]ed [facility]   4. Type 2 DM  -His glucose level has increased lately, it was 267 on 12/02/16. I encouraged him to watch his diet, avoid any sweets, and monitor closely at home -I previously encouraged him to follow-up with his primary care physician Dr. WJimmye Norman He is in the process to change his primary care physician. -I previously recommended him to increase metformin to 1000 mg twice daily, to better control his hyperglycemia.  5. HTN, Dilated nonischemic ischemia cardiomyopathy with EF 25% -He is clinically doing well without symptoms of CHF.  However this is probably going to impact his chemotherapy.Will try to avoid cardiotoxic chemotherapy agent and avoid fluid overload during chemotherapy. -Continue follow-up with cardiology. -will hold on his lisinopril-HCTZ for now, his BP has been normal lately (02/03/17)  6 Hepatitis B carrier, with mild portal hypertension  -Per liver clinic, no need for treatment. Follow-up with Dr. SSilvio Pate  7. Malnutrition -I previously encouraged him to eat more, and take supplements as needed. -improved, gained weight back after I reduce his chemotherapy dose  -follow up with Dietitian  -He does not want to eat. I discussed with his son to increase ensure to 2 a day and trying glucerna.  -I encouraged him to eat what he would like liberally as long as his side effects can tolerate it.   8. Anemia secondary to GI bleeding, iron deficiency and chemo  -Repeat lab on 06/06/2015 showed ferritin 117, serum iron 24, saturation 8%, which supports iron deficiency -He received IV Feraheme again in Aug 2016 after GI bleeding  -Repeat a ferritin was 273 on 10/03/2015, much improved  -he received iv feraheme again on 11/27/2015 and 12/04/2015, but anemia did not improve much. -His anemia has been slightly worse lately, probably related to chemotherapy,  we'll close monitor any signs of GI bleeding. -Blood transfusion if hemoglobin less than 8 or symptomatic anemia with hemoglobin 8-9 -We previously discussed that the patient does not need a refill on his Folic Acid since he is not drinking alcohol anymore.  9. hypomagnesemia  -secondary to panitumumab -he receives IV mag 6 g every week  -He is taking magnesium pill 2 tablets 3 times a day -continue monitoring   10. Goal of care discussion  -We again discussed the incurable nature of his cancer, and the overall poor prognosis, especially if he does not have good response to chemotherapy or progress on chemo -The patient understands the goal of care is  palliative. -I recommend DNR/DNI, he wishes to be full code for now.   11. memory loss -He has had more memory loss lately, his wife manage his medications at home. -He denies headaches or other neurological symptoms. -? Chemo brain versus underlying dementia  -We will closely monitor   Plan -Lab reviewed, we'll proceed oxaliplatin today, he started this cycle Xeloda 2 days ago, will continue for 1 days, then hold until he returns for his next visit with app -panitumumab in 2,4 and 6 weeks  -Lab, flush, f/u (app in 3 weeks with APP and me in 6 weeks) and oxaliplatin in 3 weeks and 6 weeks  -Magnesium infusion every Wednesday X4, starting in 3 weeks  -CT scan in 5 weeks  -Refill Xeloda and Protonix today   All questions were answered. The patient knows to call the clinic with any problems, questions or concerns.  I spent 25 minutes counseling the patient face to face. The total time spent in the appointment was 30 minutes and more than 50% was on counseling.  This document serves as a record of services personally performed by Truitt Merle, MD. It was created on her behalf by Joslyn Devon, a trained medical scribe. The creation of this record is based on the scribe's personal observations and the provider's statements to them. This document has been checked and approved by the attending provider.   I have reviewed the above documentation for accuracy and completeness and I agree with the above.   Truitt Merle, MD 04/02/2017

## 2017-03-31 ENCOUNTER — Ambulatory Visit (HOSPITAL_BASED_OUTPATIENT_CLINIC_OR_DEPARTMENT_OTHER): Payer: Medicare Other

## 2017-03-31 VITALS — BP 102/69 | HR 99 | Temp 98.7°F | Resp 18

## 2017-03-31 DIAGNOSIS — C787 Secondary malignant neoplasm of liver and intrahepatic bile duct: Secondary | ICD-10-CM | POA: Diagnosis not present

## 2017-03-31 DIAGNOSIS — C187 Malignant neoplasm of sigmoid colon: Secondary | ICD-10-CM | POA: Diagnosis not present

## 2017-03-31 DIAGNOSIS — Z5112 Encounter for antineoplastic immunotherapy: Secondary | ICD-10-CM | POA: Diagnosis not present

## 2017-03-31 MED ORDER — PANITUMUMAB CHEMO INJECTION 100 MG/5ML
6.3000 mg/kg | Freq: Once | INTRAVENOUS | Status: AC
  Administered 2017-03-31: 400 mg via INTRAVENOUS
  Filled 2017-03-31: qty 20

## 2017-03-31 MED ORDER — SODIUM CHLORIDE 0.9 % IV SOLN
Freq: Once | INTRAVENOUS | Status: AC
  Administered 2017-03-31: 09:00:00 via INTRAVENOUS

## 2017-03-31 MED ORDER — HEPARIN SOD (PORK) LOCK FLUSH 100 UNIT/ML IV SOLN
500.0000 [IU] | Freq: Once | INTRAVENOUS | Status: AC | PRN
  Administered 2017-03-31: 500 [IU]
  Filled 2017-03-31: qty 5

## 2017-03-31 MED ORDER — SODIUM CHLORIDE 0.9 % IJ SOLN
10.0000 mL | INTRAMUSCULAR | Status: DC | PRN
  Administered 2017-03-31: 10 mL
  Filled 2017-03-31: qty 10

## 2017-03-31 NOTE — Progress Notes (Signed)
Per [name] okay to proceed with today's (03/31/17) treatment with no new labs. To have labs on 04/02/17.

## 2017-03-31 NOTE — Patient Instructions (Signed)
[facility] Discharge Instructions for Patients Receiving Chemotherapy  Today you received the following chemotherapy agents: Panitumumab   To help prevent nausea and vomiting after your treatment, we encourage you to take your nausea medication as directed.    If you develop nausea and vomiting that is not controlled by your nausea medication, call the clinic.   BELOW ARE SYMPTOMS THAT SHOULD BE REPORTED IMMEDIATELY:  *FEVER GREATER THAN 100.5 F  *CHILLS WITH OR WITHOUT FEVER  NAUSEA AND VOMITING THAT IS NOT CONTROLLED WITH YOUR NAUSEA MEDICATION  *UNUSUAL SHORTNESS OF BREATH  *UNUSUAL BRUISING OR BLEEDING  TENDERNESS IN MOUTH AND THROAT WITH OR WITHOUT PRESENCE OF ULCERS  *URINARY PROBLEMS  *BOWEL PROBLEMS  UNUSUAL RASH Items with * indicate a potential emergency and should be followed up as soon as possible.  Feel free to call the clinic you have any questions or concerns. The clinic phone number is [phone].  Please show the CHEMO ALERT CARD at check-in to the Emergency Department and triage nurse.   

## 2017-04-02 ENCOUNTER — Ambulatory Visit (HOSPITAL_BASED_OUTPATIENT_CLINIC_OR_DEPARTMENT_OTHER): Payer: Medicare Other

## 2017-04-02 ENCOUNTER — Ambulatory Visit (HOSPITAL_BASED_OUTPATIENT_CLINIC_OR_DEPARTMENT_OTHER): Payer: Medicare Other | Admitting: Hematology

## 2017-04-02 ENCOUNTER — Other Ambulatory Visit (HOSPITAL_BASED_OUTPATIENT_CLINIC_OR_DEPARTMENT_OTHER): Payer: Medicare Other

## 2017-04-02 ENCOUNTER — Encounter: Payer: Self-pay | Admitting: Hematology

## 2017-04-02 VITALS — BP 117/66 | HR 101 | Temp 98.5°F | Resp 18 | Ht 65.0 in | Wt 138.3 lb

## 2017-04-02 DIAGNOSIS — C187 Malignant neoplasm of sigmoid colon: Secondary | ICD-10-CM | POA: Diagnosis not present

## 2017-04-02 DIAGNOSIS — D509 Iron deficiency anemia, unspecified: Secondary | ICD-10-CM

## 2017-04-02 DIAGNOSIS — Z5111 Encounter for antineoplastic chemotherapy: Secondary | ICD-10-CM | POA: Diagnosis not present

## 2017-04-02 DIAGNOSIS — I10 Essential (primary) hypertension: Secondary | ICD-10-CM

## 2017-04-02 DIAGNOSIS — C787 Secondary malignant neoplasm of liver and intrahepatic bile duct: Secondary | ICD-10-CM | POA: Diagnosis not present

## 2017-04-02 DIAGNOSIS — C7800 Secondary malignant neoplasm of unspecified lung: Secondary | ICD-10-CM | POA: Diagnosis not present

## 2017-04-02 DIAGNOSIS — Z95828 Presence of other vascular implants and grafts: Secondary | ICD-10-CM

## 2017-04-02 DIAGNOSIS — C7972 Secondary malignant neoplasm of left adrenal gland: Secondary | ICD-10-CM | POA: Diagnosis not present

## 2017-04-02 DIAGNOSIS — D6481 Anemia due to antineoplastic chemotherapy: Secondary | ICD-10-CM

## 2017-04-02 DIAGNOSIS — I5042 Chronic combined systolic (congestive) and diastolic (congestive) heart failure: Secondary | ICD-10-CM

## 2017-04-02 DIAGNOSIS — E119 Type 2 diabetes mellitus without complications: Secondary | ICD-10-CM

## 2017-04-02 LAB — COMPREHENSIVE METABOLIC PANEL
ALT: 16 U/L (ref 0–55)
AST: 32 U/L (ref 5–34)
Albumin: 3.3 g/dL — ABNORMAL LOW (ref 3.5–5.0)
Alkaline Phosphatase: 107 U/L (ref 40–150)
Anion Gap: 10 mEq/L (ref 3–11)
BILIRUBIN TOTAL: 0.92 mg/dL (ref 0.20–1.20)
BUN: 13.5 mg/dL (ref 7.0–26.0)
CO2: 23 meq/L (ref 22–29)
CREATININE: 0.8 mg/dL (ref 0.7–1.3)
Calcium: 9 mg/dL (ref 8.4–10.4)
Chloride: 108 mEq/L (ref 98–109)
EGFR: >90 mL/min/{1.73_m2} (ref >90)
Glucose: 198 mg/dl — ABNORMAL HIGH (ref 70–140)
Potassium: 4 mEq/L (ref 3.5–5.1)
SODIUM: 142 meq/L (ref 136–145)
TOTAL PROTEIN: 8.2 g/dL (ref 6.4–8.3)

## 2017-04-02 LAB — CBC WITH DIFFERENTIAL/PLATELET
BASO%: 0.6 % (ref 0.0–2.0)
Basophils Absolute: 0 10*3/uL (ref 0.0–0.1)
EOS ABS: 0.1 10*3/uL (ref 0.0–0.5)
EOS%: 2.9 % (ref 0.0–7.0)
HCT: 27.1 % — ABNORMAL LOW (ref 38.4–49.9)
HEMOGLOBIN: 9 g/dL — AB (ref 13.0–17.1)
LYMPH%: 15 % (ref 14.0–49.0)
MCH: 30.3 pg (ref 27.2–33.4)
MCHC: 33.2 g/dL (ref 32.0–36.0)
MCV: 91.5 fL (ref 79.3–98.0)
MONO#: 0.4 10*3/uL (ref 0.1–0.9)
MONO%: 12.1 % (ref 0.0–14.0)
NEUT%: 69.4 % (ref 39.0–75.0)
NEUTROS ABS: 2.3 10*3/uL (ref 1.5–6.5)
Platelets: 115 10*3/uL — ABNORMAL LOW (ref 140–400)
RBC: 2.96 10*6/uL — ABNORMAL LOW (ref 4.20–5.82)
RDW: 29.2 % — AB (ref 11.0–14.6)
WBC: 3.4 10*3/uL — AB (ref 4.0–10.3)
lymph#: 0.5 10*3/uL — ABNORMAL LOW (ref 0.9–3.3)

## 2017-04-02 LAB — CEA (IN HOUSE-CHCC)

## 2017-04-02 LAB — MAGNESIUM: MAGNESIUM: 0.8 mg/dL — AB (ref 1.5–2.5)

## 2017-04-02 MED ORDER — SODIUM CHLORIDE 0.9 % IV SOLN: Freq: Once | INTRAVENOUS | Status: DC

## 2017-04-02 MED ORDER — SODIUM CHLORIDE 0.9 % IJ SOLN
10.0000 mL | INTRAMUSCULAR | Status: DC | PRN
  Filled 2017-04-02: qty 10

## 2017-04-02 MED ORDER — CAPECITABINE 500 MG PO TABS: 850.0000 mg/m2 | ORAL_TABLET | Freq: Two times a day (BID) | ORAL | 2 refills | Status: DC

## 2017-04-02 MED ORDER — PANTOPRAZOLE SODIUM 40 MG PO TBEC: 40.0000 mg | DELAYED_RELEASE_TABLET | Freq: Every day | ORAL | 3 refills | Status: AC

## 2017-04-02 MED ORDER — DEXTROSE 5 % IV SOLN
Freq: Once | INTRAVENOUS | Status: AC
  Administered 2017-04-02: 14:00:00 via INTRAVENOUS

## 2017-04-02 MED ORDER — PALONOSETRON HCL INJECTION 0.25 MG/5ML
0.2500 mg | Freq: Once | INTRAVENOUS | Status: AC
  Administered 2017-04-02: 0.25 mg via INTRAVENOUS

## 2017-04-02 MED ORDER — OXALIPLATIN CHEMO INJECTION 100 MG/20ML
85.0000 mg/m2 | Freq: Once | INTRAVENOUS | Status: AC
  Administered 2017-04-02: 150 mg via INTRAVENOUS
  Filled 2017-04-02: qty 10

## 2017-04-02 MED ORDER — DEXAMETHASONE SODIUM PHOSPHATE 10 MG/ML IJ SOLN
INTRAMUSCULAR | Status: AC
  Filled 2017-04-02: qty 1

## 2017-04-02 MED ORDER — HEPARIN SOD (PORK) LOCK FLUSH 100 UNIT/ML IV SOLN
500.0000 [IU] | Freq: Once | INTRAVENOUS | Status: AC | PRN
  Administered 2017-04-02: 500 [IU]
  Filled 2017-04-02: qty 5

## 2017-04-02 MED ORDER — PALONOSETRON HCL INJECTION 0.25 MG/5ML
INTRAVENOUS | Status: AC
Start: 2017-04-02 — End: 2017-04-02
  Filled 2017-04-02: qty 5

## 2017-04-02 MED ORDER — DEXAMETHASONE SODIUM PHOSPHATE 10 MG/ML IJ SOLN
10.0000 mg | Freq: Once | INTRAMUSCULAR | Status: AC
  Administered 2017-04-02: 10 mg via INTRAVENOUS

## 2017-04-02 MED ORDER — MAGNESIUM SULFATE 4 GM/100ML IV SOLN
6.0000 g | Freq: Once | INTRAVENOUS | Status: DC
Start: 2017-04-02 — End: 2017-04-02
  Administered 2017-04-02: 6 g via INTRAVENOUS
  Filled 2017-04-02: qty 50

## 2017-04-02 MED ORDER — SODIUM CHLORIDE 0.9% FLUSH
10.0000 mL | INTRAVENOUS | Status: DC | PRN
  Administered 2017-04-02: 10 mL
  Filled 2017-04-02: qty 10

## 2017-04-02 NOTE — Progress Notes (Signed)
Per [name] okay to treat with magnesium of 0.8 and heart rate of 101.

## 2017-04-02 NOTE — Patient Instructions (Signed)
[facility] Discharge Instructions for Patients Receiving Chemotherapy  Today you received the following chemotherapy agents: Oxaliplatin  To help prevent nausea and vomiting after your treatment, we encourage you to take your nausea medication as directed.    If you develop nausea and vomiting that is not controlled by your nausea medication, call the clinic.   BELOW ARE SYMPTOMS THAT SHOULD BE REPORTED IMMEDIATELY:  *FEVER GREATER THAN 100.5 F  *CHILLS WITH OR WITHOUT FEVER  NAUSEA AND VOMITING THAT IS NOT CONTROLLED WITH YOUR NAUSEA MEDICATION  *UNUSUAL SHORTNESS OF BREATH  *UNUSUAL BRUISING OR BLEEDING  TENDERNESS IN MOUTH AND THROAT WITH OR WITHOUT PRESENCE OF ULCERS  *URINARY PROBLEMS  *BOWEL PROBLEMS  UNUSUAL RASH Items with * indicate a potential emergency and should be followed up as soon as possible.  Feel free to call the clinic you have any questions or concerns. The clinic phone number is [phone].  Please show the [facility] at check-in to the Emergency Department and triage nurse.  Hypomagnesemia Hypomagnesemia is a condition in which the level of magnesium in the blood is low. Magnesium is a mineral that is found in many foods. It is used in many different processes in the body. Hypomagnesemia can affect every organ in the body. It can cause life-threatening problems. What are the causes? Causes of hypomagnesemia include:  Not getting enough magnesium in your diet.  Malnutrition.  Problems with absorbing magnesium from the intestines.  Dehydration.  Alcohol abuse.  Vomiting.  Severe diarrhea.  Some medicines, including medicines that make you urinate more.  Certain diseases, such as kidney disease, diabetes, and overactive thyroid.  What are the signs or symptoms?  Involuntary shaking or trembling of a body part (tremor).  Confusion.  Muscle weakness.  Sensitivity to light, sound, and touch.  Psychiatric  issues, such as depression, irritability, or psychosis.  Sudden tightening of muscles (muscle spasms).  Tingling in the arms and legs.  A feeling of fluttering of the heart. These symptoms are more severe if magnesium levels drop suddenly. How is this diagnosed? To make a diagnosis, your health care provider will do a physical exam and order blood and urine tests. How is this treated? Treatment will depend on the cause and the severity of your condition. It may involve:  A magnesium supplement. This can be taken in pill form. It can also be given through an IV tube. This is usually done if the condition is severe.  Changes to your diet. You may be directed to eat foods that have a lot of magnesium, such as green leafy vegetables, peas, beans, and nuts.  Eliminating alcohol from your diet.  Follow these instructions at home:  Include foods with magnesium in your diet. Foods that are rich in magnesium include green vegetables, beans, nuts and seeds, and whole grains.  Take medicines only as directed by your health care provider.  Take magnesium supplements if your health care provider instructs you to do that. Take them as directed.  Have your magnesium levels monitored as directed by your health care provider.  When you are active, drink fluids that contain electrolytes.  Keep all follow-up visits as directed by your health care provider. This is important. Contact a health care provider if:  You get worse instead of better.  Your symptoms return. Get help right away if:  Your symptoms are severe. This information is not intended to replace advice given to you by your health care provider. Make sure  you discuss any questions you have with your health care provider. Document Released: 07/10/2005 Document Revised: 03/21/2016 Document Reviewed: 05/30/2014 Elsevier Interactive Patient Education  2018 [employer].

## 2017-04-02 NOTE — Patient Instructions (Signed)

## 2017-04-03 ENCOUNTER — Encounter: Payer: Self-pay | Admitting: Hematology

## 2017-04-09 ENCOUNTER — Ambulatory Visit (HOSPITAL_BASED_OUTPATIENT_CLINIC_OR_DEPARTMENT_OTHER): Payer: Medicare Other

## 2017-04-09 ENCOUNTER — Other Ambulatory Visit: Payer: Self-pay | Admitting: *Deleted

## 2017-04-09 VITALS — BP 104/73 | HR 98 | Temp 98.0°F | Resp 16

## 2017-04-09 DIAGNOSIS — D509 Iron deficiency anemia, unspecified: Secondary | ICD-10-CM

## 2017-04-09 DIAGNOSIS — Z95828 Presence of other vascular implants and grafts: Secondary | ICD-10-CM

## 2017-04-09 LAB — MAGNESIUM: Magnesium: 0.9 mg/dl — CL (ref 1.5–2.5)

## 2017-04-09 MED ORDER — SODIUM CHLORIDE 0.9 % IV SOLN
Freq: Once | INTRAVENOUS | Status: AC
  Administered 2017-04-09: 09:00:00 via INTRAVENOUS
  Filled 2017-04-09: qty 250

## 2017-04-09 MED ORDER — SODIUM CHLORIDE 0.9 % IJ SOLN
10.0000 mL | INTRAMUSCULAR | Status: DC | PRN
  Administered 2017-04-09: 10 mL via INTRAVENOUS
  Filled 2017-04-09: qty 10

## 2017-04-09 MED ORDER — HEPARIN SOD (PORK) LOCK FLUSH 100 UNIT/ML IV SOLN
500.0000 [IU] | Freq: Once | INTRAVENOUS | Status: AC | PRN
  Administered 2017-04-09: 500 [IU] via INTRAVENOUS
  Filled 2017-04-09: qty 5

## 2017-04-09 NOTE — Progress Notes (Signed)
Per [name], Magnesium should be drawn today, but do not have to wait prior to Mag infusion.

## 2017-04-09 NOTE — Patient Instructions (Signed)
Hypomagnesemia  Hypomagnesemia is a condition in which the level of magnesium in the blood is low. Magnesium is a mineral that is found in many foods. It is used in many different processes in the body. Hypomagnesemia can affect every organ in the body. It can cause life-threatening problems.  What are the causes?  Causes of hypomagnesemia include:   Not getting enough magnesium in your diet.   Malnutrition.   Problems with absorbing magnesium from the intestines.   Dehydration.   Alcohol abuse.   Vomiting.   Severe diarrhea.   Some medicines, including medicines that make you urinate more.   Certain diseases, such as kidney disease, diabetes, and overactive thyroid.    What are the signs or symptoms?   Involuntary shaking or trembling of a body part (tremor).   Confusion.   Muscle weakness.   Sensitivity to light, sound, and touch.   Psychiatric issues, such as depression, irritability, or psychosis.   Sudden tightening of muscles (muscle spasms).   Tingling in the arms and legs.   A feeling of fluttering of the heart.  These symptoms are more severe if magnesium levels drop suddenly.  How is this diagnosed?  To make a diagnosis, your health care provider will do a physical exam and order blood and urine tests.  How is this treated?  Treatment will depend on the cause and the severity of your condition. It may involve:   A magnesium supplement. This can be taken in pill form. It can also be given through an IV tube. This is usually done if the condition is severe.   Changes to your diet. You may be directed to eat foods that have a lot of magnesium, such as green leafy vegetables, peas, beans, and nuts.   Eliminating alcohol from your diet.    Follow these instructions at home:   Include foods with magnesium in your diet. Foods that are rich in magnesium include green vegetables, beans, nuts and seeds, and whole grains.   Take medicines only as directed by your health care provider.   Take  magnesium supplements if your health care provider instructs you to do that. Take them as directed.   Have your magnesium levels monitored as directed by your health care provider.   When you are active, drink fluids that contain electrolytes.   Keep all follow-up visits as directed by your health care provider. This is important.  Contact a health care provider if:   You get worse instead of better.   Your symptoms return.  Get help right away if:   Your symptoms are severe.  This information is not intended to replace advice given to you by your health care provider. Make sure you discuss any questions you have with your health care provider.  Document Released: 07/10/2005 Document Revised: 03/21/2016 Document Reviewed: 05/30/2014  Elsevier Interactive Patient Education  2018 [employer]

## 2017-04-10 ENCOUNTER — Telehealth: Payer: Self-pay | Admitting: Hematology

## 2017-04-10 NOTE — Telephone Encounter (Signed)
Left message for patient that additional appts have been added for July - patient to get new schedule next visit - 6/20.

## 2017-04-15 ENCOUNTER — Telehealth: Payer: Self-pay | Admitting: Hematology

## 2017-04-15 NOTE — Telephone Encounter (Signed)
Called to confirm appts added - message left for patient to pick up new schedule when he comes in 6/20

## 2017-04-16 ENCOUNTER — Ambulatory Visit (HOSPITAL_BASED_OUTPATIENT_CLINIC_OR_DEPARTMENT_OTHER): Payer: Medicare Other

## 2017-04-16 VITALS — BP 106/66 | HR 92 | Temp 98.3°F | Resp 16

## 2017-04-16 DIAGNOSIS — Z5112 Encounter for antineoplastic immunotherapy: Secondary | ICD-10-CM | POA: Diagnosis not present

## 2017-04-16 DIAGNOSIS — C187 Malignant neoplasm of sigmoid colon: Secondary | ICD-10-CM | POA: Diagnosis not present

## 2017-04-16 DIAGNOSIS — D509 Iron deficiency anemia, unspecified: Secondary | ICD-10-CM

## 2017-04-16 LAB — CBC WITH DIFFERENTIAL/PLATELET
BASO%: 0.6 % (ref 0.0–2.0)
Basophils Absolute: 0 10*3/uL (ref 0.0–0.1)
EOS%: 2.1 % (ref 0.0–7.0)
Eosinophils Absolute: 0.1 10*3/uL (ref 0.0–0.5)
HEMATOCRIT: 25 % — AB (ref 38.4–49.9)
HGB: 8.3 g/dL — ABNORMAL LOW (ref 13.0–17.1)
LYMPH#: 0.5 10*3/uL — AB (ref 0.9–3.3)
LYMPH%: 10 % — AB (ref 14.0–49.0)
MCH: 31.3 pg (ref 27.2–33.4)
MCHC: 33.1 g/dL (ref 32.0–36.0)
MCV: 94.5 fL (ref 79.3–98.0)
MONO#: 0.8 10*3/uL (ref 0.1–0.9)
MONO%: 17.4 % — ABNORMAL HIGH (ref 0.0–14.0)
NEUT#: 3.2 10*3/uL (ref 1.5–6.5)
NEUT%: 69.9 % (ref 39.0–75.0)
PLATELETS: 139 10*3/uL — AB (ref 140–400)
RBC: 2.64 10*6/uL — ABNORMAL LOW (ref 4.20–5.82)
RDW: 30.5 % — ABNORMAL HIGH (ref 11.0–14.6)
WBC: 4.5 10*3/uL (ref 4.0–10.3)

## 2017-04-16 LAB — COMPREHENSIVE METABOLIC PANEL
ALT: 18 U/L (ref 0–55)
ANION GAP: 11 meq/L (ref 3–11)
AST: 30 U/L (ref 5–34)
Albumin: 3 g/dL — ABNORMAL LOW (ref 3.5–5.0)
Alkaline Phosphatase: 123 U/L (ref 40–150)
BUN: 13.3 mg/dL (ref 7.0–26.0)
CALCIUM: 9 mg/dL (ref 8.4–10.4)
CHLORIDE: 104 meq/L (ref 98–109)
CO2: 22 meq/L (ref 22–29)
Creatinine: 0.9 mg/dL (ref 0.7–1.3)
EGFR: 88 mL/min/{1.73_m2} — AB (ref >90)
Glucose: 219 mg/dl — ABNORMAL HIGH (ref 70–140)
Potassium: 4.1 mEq/L (ref 3.5–5.1)
Sodium: 138 mEq/L (ref 136–145)
Total Bilirubin: 0.95 mg/dL (ref 0.20–1.20)
Total Protein: 7.5 g/dL (ref 6.4–8.3)

## 2017-04-16 MED ORDER — SODIUM CHLORIDE 0.9 % IV SOLN
6.0000 g | Freq: Once | INTRAVENOUS | Status: AC
  Administered 2017-04-16: 6 g via INTRAVENOUS
  Filled 2017-04-16: qty 250

## 2017-04-16 MED ORDER — HEPARIN SOD (PORK) LOCK FLUSH 100 UNIT/ML IV SOLN
500.0000 [IU] | Freq: Once | INTRAVENOUS | Status: AC | PRN
Start: 2017-04-16 — End: 2017-04-16
  Administered 2017-04-16: 500 [IU]
  Filled 2017-04-16: qty 5

## 2017-04-16 MED ORDER — SODIUM CHLORIDE 0.9 % IV SOLN
6.3000 mg/kg | Freq: Once | INTRAVENOUS | Status: AC
  Administered 2017-04-16: 400 mg via INTRAVENOUS
  Filled 2017-04-16: qty 20

## 2017-04-16 MED ORDER — SODIUM CHLORIDE 0.9 % IJ SOLN
10.0000 mL | INTRAMUSCULAR | Status: DC | PRN
  Administered 2017-04-16: 10 mL
  Filled 2017-04-16: qty 10

## 2017-04-16 MED ORDER — SODIUM CHLORIDE 0.9 % IV SOLN
Freq: Once | INTRAVENOUS | Status: AC
  Administered 2017-04-16: 12:00:00 via INTRAVENOUS

## 2017-04-22 DIAGNOSIS — I10 Essential (primary) hypertension: Secondary | ICD-10-CM | POA: Diagnosis not present

## 2017-04-22 DIAGNOSIS — Z8719 Personal history of other diseases of the digestive system: Secondary | ICD-10-CM | POA: Diagnosis not present

## 2017-04-22 DIAGNOSIS — E11610 Type 2 diabetes mellitus with diabetic neuropathic arthropathy: Secondary | ICD-10-CM | POA: Diagnosis not present

## 2017-04-22 DIAGNOSIS — I429 Cardiomyopathy, unspecified: Secondary | ICD-10-CM | POA: Diagnosis not present

## 2017-04-23 ENCOUNTER — Ambulatory Visit (HOSPITAL_BASED_OUTPATIENT_CLINIC_OR_DEPARTMENT_OTHER): Payer: Medicare Other

## 2017-04-23 ENCOUNTER — Telehealth: Payer: Self-pay | Admitting: *Deleted

## 2017-04-23 ENCOUNTER — Other Ambulatory Visit: Payer: Self-pay | Admitting: Nurse Practitioner

## 2017-04-23 ENCOUNTER — Ambulatory Visit (HOSPITAL_COMMUNITY)
Admission: RE | Admit: 2017-04-23 | Discharge: 2017-04-23 | Disposition: A | Discharge status: Home / Self Care | Payer: Medicare Other | Source: Ambulatory Visit | Attending: Hematology | Admitting: Hematology

## 2017-04-23 ENCOUNTER — Ambulatory Visit: Payer: Medicare Other

## 2017-04-23 ENCOUNTER — Other Ambulatory Visit (HOSPITAL_BASED_OUTPATIENT_CLINIC_OR_DEPARTMENT_OTHER): Payer: Medicare Other

## 2017-04-23 VITALS — BP 114/59 | HR 89 | Temp 99.0°F | Resp 18

## 2017-04-23 DIAGNOSIS — D649 Anemia, unspecified: Secondary | ICD-10-CM

## 2017-04-23 DIAGNOSIS — C187 Malignant neoplasm of sigmoid colon: Secondary | ICD-10-CM

## 2017-04-23 DIAGNOSIS — D509 Iron deficiency anemia, unspecified: Secondary | ICD-10-CM

## 2017-04-23 LAB — CBC WITH DIFFERENTIAL/PLATELET
BASO%: 1 % (ref 0.0–2.0)
BASOS ABS: 0 10*3/uL (ref 0.0–0.1)
EOS ABS: 0.1 10*3/uL (ref 0.0–0.5)
EOS%: 3 % (ref 0.0–7.0)
HCT: 23.9 % — ABNORMAL LOW (ref 38.4–49.9)
HEMOGLOBIN: 7.8 g/dL — AB (ref 13.0–17.1)
LYMPH%: 20.8 % (ref 14.0–49.0)
MCH: 30.7 pg (ref 27.2–33.4)
MCHC: 32.6 g/dL (ref 32.0–36.0)
MCV: 94.1 fL (ref 79.3–98.0)
MONO#: 0.5 10*3/uL (ref 0.1–0.9)
MONO%: 13.1 % (ref 0.0–14.0)
NEUT%: 62.1 % (ref 39.0–75.0)
NEUTROS ABS: 2.5 10*3/uL (ref 1.5–6.5)
PLATELETS: 105 10*3/uL — AB (ref 140–400)
RBC: 2.54 10*6/uL — ABNORMAL LOW (ref 4.20–5.82)
RDW: 25.5 % — AB (ref 11.0–14.6)
WBC: 4 10*3/uL (ref 4.0–10.3)
lymph#: 0.8 10*3/uL — ABNORMAL LOW (ref 0.9–3.3)

## 2017-04-23 LAB — COMPREHENSIVE METABOLIC PANEL
ALK PHOS: 114 U/L (ref 40–150)
ALT: 17 U/L (ref 0–55)
AST: 39 U/L — ABNORMAL HIGH (ref 5–34)
Albumin: 2.9 g/dL — ABNORMAL LOW (ref 3.5–5.0)
Anion Gap: 11 mEq/L (ref 3–11)
BUN: 16.2 mg/dL (ref 7.0–26.0)
CHLORIDE: 107 meq/L (ref 98–109)
CO2: 23 meq/L (ref 22–29)
Calcium: 8.7 mg/dL (ref 8.4–10.4)
Creatinine: 1 mg/dL (ref 0.7–1.3)
EGFR: 78 mL/min/{1.73_m2} — AB (ref >90)
GLUCOSE: 124 mg/dL (ref 70–140)
POTASSIUM: 4.1 meq/L (ref 3.5–5.1)
SODIUM: 140 meq/L (ref 136–145)
Total Bilirubin: 0.72 mg/dL (ref 0.20–1.20)
Total Protein: 7.4 g/dL (ref 6.4–8.3)

## 2017-04-23 LAB — MAGNESIUM: Magnesium: 1 mg/dl — CL (ref 1.5–2.5)

## 2017-04-23 MED ORDER — SODIUM CHLORIDE 0.9 % IV SOLN
6.0000 g | Freq: Once | INTRAVENOUS | Status: AC
  Administered 2017-04-23: 6 g via INTRAVENOUS
  Filled 2017-04-23: qty 250

## 2017-04-23 MED ORDER — SODIUM CHLORIDE 0.9 % IV SOLN
6.0000 g | Freq: Once | INTRAVENOUS | Status: DC
  Filled 2017-04-23: qty 250

## 2017-04-23 MED ORDER — SODIUM CHLORIDE 0.9 % IV SOLN
INTRAVENOUS | Status: DC
  Administered 2017-04-23: 14:00:00 via INTRAVENOUS

## 2017-04-23 NOTE — Telephone Encounter (Signed)
Received copy of today's CMET, Mg results from [facility].  [facility] infusion room pharmacist notified Mg = 1.0.  Patient receiving magnesium and Oxaliplatin today.

## 2017-04-23 NOTE — Progress Notes (Signed)
Patient arrives for infusion today with hypotension and Hbg of 7.8. He is accompanied by his son. He complains of increased fatigue and dizziness and some diarrhea. He also reports decreased appetite and not having enough fluids.  Per Dr. Benay Spice, hold chemo today and instruct patient to hold his xeloda at home. He will get an IVF bolus today and he is scheduled to receive blood at [facility] tomorrow.Ned Card, NP saw the patient in infusion.

## 2017-04-23 NOTE — Telephone Encounter (Signed)
Patient is scheduled for 2 units blood [facility] tomorrow at 9am. Infusion nurse will draw type and hold today and advise patient of appointment for tomorrow.

## 2017-04-24 ENCOUNTER — Ambulatory Visit (HOSPITAL_COMMUNITY)
Admission: RE | Admit: 2017-04-24 | Discharge: 2017-04-24 | Disposition: A | Discharge status: Home / Self Care | Payer: Medicare Other | Source: Ambulatory Visit | Attending: Hematology | Admitting: Hematology

## 2017-04-24 DIAGNOSIS — D649 Anemia, unspecified: Secondary | ICD-10-CM | POA: Diagnosis not present

## 2017-04-24 LAB — PREPARE RBC (CROSSMATCH)

## 2017-04-24 MED ORDER — HEPARIN SOD (PORK) LOCK FLUSH 100 UNIT/ML IV SOLN: 250.0000 [IU] | INTRAVENOUS | Status: DC | PRN

## 2017-04-24 MED ORDER — SODIUM CHLORIDE 0.9% FLUSH
10.0000 mL | INTRAVENOUS | Status: AC | PRN
  Administered 2017-04-24: 10 mL

## 2017-04-24 MED ORDER — SODIUM CHLORIDE 0.9 % IV SOLN
250.0000 mL | Freq: Once | INTRAVENOUS | Status: AC
  Administered 2017-04-24: 250 mL via INTRAVENOUS

## 2017-04-24 MED ORDER — HEPARIN SOD (PORK) LOCK FLUSH 100 UNIT/ML IV SOLN
500.0000 [IU] | Freq: Every day | INTRAVENOUS | Status: AC | PRN
  Administered 2017-04-24: 500 [IU]
  Filled 2017-04-24: qty 5

## 2017-04-24 MED ORDER — SODIUM CHLORIDE 0.9% FLUSH: 3.0000 mL | INTRAVENOUS | Status: DC | PRN

## 2017-04-24 NOTE — Discharge Instructions (Signed)
Transfusión de sangre, cuidados posteriores °(Blood Transfusion, Care After) °Estas indicaciones le proporcionan información acerca de cómo deberá cuidarse después del procedimiento. El médico también podrá darle instrucciones específicas. Comuníquese con el médico si tiene algún problema o tiene preguntas después del procedimiento. °CUIDADOS EN EL HOGAR °· Tome los medicamentos solamente como se lo haya indicado el médico. Pregúntele al médico si puede tomar un analgésico de venta libre si tiene fiebre o dolor de cabeza uno o dos días después del procedimiento. °· Retome sus actividades habituales como se lo haya indicado el médico. ° °SOLICITE AYUDA SI: °· Presenta enrojecimiento o irritación en el lugar donde se colocó la vía intravenosa. °· Tiene fiebre, escalofríos o dolor de cabeza que no se alivia. °· La orina es más oscura que lo normal. °· La orina se vuelve de color: °? Rosa. °? Rojo. °? Marrón. °· La parte blanca de los ojos se vuelve amarilla (ictericia). °· Se siente débil después de realizar sus actividades habituales. ° °SOLICITE AYUDA DE INMEDIATO SI: °· Tiene dificultad para respirar. °· Tiene fiebre y escalofríos, y también siente los siguientes síntomas: °? Ansiedad. °? Dolor de pecho o de espalda. °? Piel rosada o irritada. °? Piel pegajosa o sudorosa. °? Latidos cardíacos acelerados. °? Ganas de vomitar (náuseas). ° °Esta información no tiene como fin reemplazar el consejo del médico. Asegúrese de hacerle al médico cualquier pregunta que tenga. °Document Released: 11/04/2014 Document Revised: 11/04/2014 Document Reviewed: 08/24/2014 °Elsevier Interactive Patient Education © 2017 Elsevier Inc. ° °

## 2017-04-24 NOTE — Progress Notes (Signed)
Diagnosis Association: Anemia, unspecified type (D64.9)  Provider: [name]  Procedure: Pt received 2 units of PRBCs.  Pt tolerated procedure well.  Post procedure: Pt alert, oriented and ambulatory at discharge. D/C instructions given to patient with verbal understanding.

## 2017-04-24 NOTE — Progress Notes (Signed)
Provider: Ky Barban  Diagnosis Association: Anemia, unspecified type (D64.9)  Treatment: 2 units PRBC's via port a cath  Patient received 2 units of PRBC's via port a chat. Patient tolerated procedure well with no transfusion reaction. Discharge instructions given to patient and patient states an understanding. Patient alert, oriented and ambulatory at time of discharge.

## 2017-04-25 LAB — TYPE AND SCREEN
ABO/RH(D): O POS
Antibody Screen: NEGATIVE
Unit division: 0
Unit division: 0

## 2017-04-25 LAB — BPAM RBC
BLOOD PRODUCT EXPIRATION DATE: 201807252359
Blood Product Expiration Date: 201807252359
ISSUE DATE / TIME: 201806280944
ISSUE DATE / TIME: 201806280944
UNIT TYPE AND RH: 5100
Unit Type and Rh: 5100

## 2017-04-29 NOTE — Progress Notes (Signed)
[place]  [phone] [phone]  Clinic follow up Note   Patient Care Team: [name], MD as PCP - General (Specialist) [name], CRNP as Nurse Practitioner (Nurse Practitioner) Truitt Merle, MD as Consulting Physician (Hematology) Ladene Artist, MD as Consulting Physician (Gastroenterology) 05/02/2017  CHIEF COMPLAINTS Follow up metastatic sigmoid colon cancer  Oncology History   He is a Metastatic colon cancer to liver   Staging form: Colon and Rectum, AJCC 7th Edition     Clinical: Stage Unknown ([place], NX, M1) - Unsigned      Cancer of sigmoid colon ([place])   10/28/2014 Tumor Marker    AFP 3.2 CEA > 10,000 CA 19-9 12,929.6. tumor (-) KRAS and NRAS mutation.       11/10/2014 Imaging    PET scan showed hypermetabolic mass in the sigmoid colon was noted metastasis in the retroperitoneum. Probable left adrenal and pulmonary metastasis, and diffuse liver metastasis.      11/21/2014 Initial Diagnosis    Metastatic colon cancer to liver, lung, abd nodes and left adrenal gland. Diagnosis was made by liver biopsy.       11/30/2014 - 05/23/2015 Chemotherapy    First line chemo [id number], Panitumumab added from second cycle, chemo held due to recurrent GI bleeding        12/28/2014 - 12/31/2014 Hospital Admission    Was admitted for dehydration, neutropenia fever with UTI, and severe skin rash.      02/08/2015 - 02/12/2015 Hospital Admission    He was admitted for upper GI bleeding, e.g. showed a gastric ulcer with clots, status post appendectomy injection. He also received a blood transfusion.      03/01/2015 Tumor Marker    CEA 694, CA19.9 462      03/13/2015 Imaging    CT CAP showed partial response, no new lesions.       05/31/2015 - 06/02/2015 Hospital Admission    He was admitted to [facility] in [place] due to upper GI bleeding, EGD showed gastric and dudenal ulcers       06/27/2015 - 06/29/2015 Hospital Admission    he was  admitted for dairrhea and pancolitis, c-diff and stool cultures were negative, treated with antibiotics      07/12/2015 -  Chemotherapy    panitumumab 46m/kg, every 2 weeks      09/19/2015 -  Chemotherapy    Irinotecan 1862mm2 every 2 weeks, dose reduction due to diarrhea. 5-Fu added on 06/10/2016 due to slight disease progression.   Due to his prolonged chemotherapy, I'll reduce his chemotherapy dose.  He will take Xeloda 150019mwice a day for 2 weeks, with the third week off. The patient will continue with oxali infusions every 3 weeks; Dose reduction to 94m51m for this cycles. Started 02/17/17.      11/08/2015 - 11/10/2015 Hospital Admission    Pt was admitted for sepsis and hypotension, was found to have (+) influenza A and treated.       11/30/2015 Imaging     CT scans reviewed continued improvement in the liver metastasis, no other new lesions.      05/06/2016 Tumor Marker    CEA has gradually increased from 110 in March 2017 to 566 in July 2017      06/04/2016 Imaging    Restaging CT scan showed similar to mild increase in hepatic metastasis, mild enlargement of right adrenal nodule, primary similar pulmonary nodules. A left upper lobe nodule has resolved.  11/12/2016 Imaging    CT CAP - Restaging IMPRESSION: 1. No significant change in the appearance of multifocal pulmonary nodules. 2. Previously referenced partially calcified liver metastases are stable from previous exam. Within the posterior right lobe of liver there is a metastasis which appears slightly increased in size from previous exam. 3. Mild decrease in size of right adrenal nodule. 4. There is mild wall thickening and inflammation involving the descending colon and proximal sigmoid colon worrisome for colitis. A small intramural fluid collection is identified which is unchanged from 08/19/2016, significance unknown. 5. Aortic atherosclerosis.      02/03/2017 Imaging    CT CAP w Contrast Chest  Impression: 1. Interval increase in size of pulmonary nodules as well as several new pulmonary nodules consistent with progression of pulmonary metastasis.  Abdomen / Pelvis Impression: 1. Interval increase in size of hepatic metastasis within LEFT and RIGHT hepatic lobe. 2. 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 Enhanced Submucosal collections in the proximal sigmoid colon are concerning for colorectal carcinoma recurrence. Resolution of descending colitis.       HISTORY OF PRESENTING ILLNESS:  Jason Reilly 71 y.o. male is here because of abnormal CT findings, which is very suspicious for malignancy. He is on ranitidine from [place], has been on in the [place] for 16 years. He came in with his son and an interpreter.  He has been feeling fatigued since two month ago. He is still able to do all ADLs. He otherwise denies any pain, bloating or nausea.  He lost about 20lbs in 3 month. His appetite is lower than before, eats less, no change of his bowl habits.  She denied any hematochezia or melana. Per his son, he has had some personality changes daily, irritable, slightly confused some time.  He was evaluated by his primary care physician. Lab test reviewed hepatitis B infection, which he did not know before, and elevated alkaline phosphatase, his liver function and the rest of the liver function was not remarkable. [place] of abdomen was obtained on 07/22/2014, which showed diffusely abnormal liver with multiple echogenic lesions. CT of abdomen with and without contrast was done on 08/26/2014, which reviewed here at a medically with multiple large partially calcified hepatic masses consistent with metastatic disease. Mild retroperitoneal adenopathy with the largest node measuring 1.6 cm. And nonspecific 1.4 cm left adrenal nodule was also noticed. His tumor marker showed CEA greater than 10,000, CA 19-9 12,929, AFP 3.2 (normal). He was referred to [facility] liver clinic and was  evaluated by nurse practitioner [name]. Treatment for hepatitis B was not recommended based on his virus load.  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 He was evaluated by a cardiologist in 2014. He denies any significant dyspnea on exertion. No leg swollen.  CURRENT THERAPY:  1. panitumumab 29m/kg, every 2 weeks, started on 07/12/2015 2. CAPOX with dose reduction every 3 weeks started on 02/17/2017 3. He also receives IV magnesium 4-6g weekly starting 04/23/17 4. Xeloda 1500 mg twice daily for two weeks on and 1 week off starting 02/17/17 --Will hold chemo for break starting from cycle 3   INTERIM HISTORY:  PHassanireturns for follow-up. He presents in the infusion room with his son and tOptometrist They report he would lose balance when go out, no fall. Both his nurse and son reports that his speech and response has been slow lately, some memory loss, and occasional confusion. He says he will want to drink  coffee or caffeine often.  He denies headaches.  His son says he has not taken Xeloda since last visit  He was responding slow overall.    MEDICAL HISTORY:  Past Medical History:  Diagnosis Date  . Abnormal EKG   . Diabetes mellitus without complication (HCC)    metformin  . Gastric ulcer   . H. pylori infection   . Hepatitis B   . Hypertension   . Metastatic colon cancer to liver ([place])   . Non-ischemic cardiomyopathy ([place])   . Tachycardia     SURGICAL HISTORY: Past Surgical History:  Procedure Laterality Date  . ESOPHAGOGASTRODUODENOSCOPY N/A 02/08/2015   Procedure: ESOPHAGOGASTRODUODENOSCOPY (EGD);  Surgeon: Ladene Artist, MD;  Location: [facility];  Service: Endoscopy;  Laterality: N/A;  . ESOPHAGOGASTRODUODENOSCOPY (EGD) WITH PROPOFOL N/A 08/15/2015   Procedure: ESOPHAGOGASTRODUODENOSCOPY (EGD) WITH PROPOFOL;  Surgeon: Ladene Artist, MD;  Location: [facility] END[facility]COPY;  Service: Endoscopy;  Laterality: N/A;  . LEFT AND RIGHT  HEART CATHETERIZATION WITH CORONARY ANGIOGRAM N/A 03/29/2013   Procedure: LEFT AND RIGHT HEART CATHETERIZATION WITH CORONARY ANGIOGRAM;  Surgeon: Pixie Casino, MD;  Location: [facility] CATH LAB;  Service: Cardiovascular;  Laterality: N/A;  . Nuclear Stress Test  03/03/2013   High risk - consistent with nonischemic cardiomyopathy    SOCIAL HISTORY: Social History   Social History  . Marital status: Married    Spouse name: N/A  . Number of children: N/A  . Years of education: N/A   Occupational History  . Not on file.   Social History Main Topics  . Smoking status: Former Smoker    Types: Cigarettes    Quit date: 11/07/2008  . Smokeless tobacco: Never Used  . Alcohol use Yes     Comment: occasional  . Drug use: No  . Sexual activity: No   Other Topics Concern  . Not on file   Social History Narrative   Married   Enjoys walking   Has lived in [place] > 16 years    FAMILY HISTORY: No family history of liver disease or malignancy.  ALLERGIES:  has No Known Allergies.  MEDICATIONS:  Current Outpatient Prescriptions on File Prior to Visit  Medication Sig Dispense Refill  . capecitabine (XELODA) 500 MG tablet Take 3 tablets (1,500 mg total) by mouth 2 (two) times daily after a meal. Take for 14 days on, 7 days off, repeat every 21 days 84 tablet 2  . clindamycin (CLINDAGEL) 1 % gel Apply topically 2 (two) times daily. 30 g 2  . diphenoxylate-atropine (LOMOTIL) 2.5-0.025 MG tablet Take 1-2 tablets by mouth 4 (four) times daily as needed for diarrhea or loose stools. (Patient not taking: Reported on 02/17/2017) 30 tablet 0  . folic acid (FOLVITE) 1 MG tablet Take 1 tablet (1 mg total) by mouth daily. 30 tablet 3  . hydrocortisone 1 % ointment Apply 1 application topically 2 (two) times daily. 56 g 0  . lidocaine-prilocaine (EMLA) cream Apply 1 application topically as needed. Apply to [facility] cath at least one and a half hour to two hours  before needle stick as needed. 30 g 2  .  lisinopril-hydrochlorothiazide (PRINZIDE,ZESTORETIC) 10-12.5 MG tablet     . loperamide (IMODIUM) 2 MG capsule Take 2 capsules (4 mg total) by mouth 4 (four) times daily as needed for diarrhea or loose stools (NO MORE THAN 8 TABLETS PER DAY.). (Patient not taking: Reported on 03/12/2017) 60 capsule 3  . magic mouthwash SOLN Take 5 mLs by mouth 4 (four)  times daily. (Patient not taking: Reported on 03/12/2017) 120 mL 1  . magnesium oxide (MAG-OX) 400 (241.3 Mg) MG tablet Take 1 tablet (400 mg total) by mouth 3 (three) times daily. 90 tablet 3  . metFORMIN (GLUCOPHAGE) 500 MG tablet Take 1 tablet (500 mg total) by mouth 2 (two) times daily with a meal. 60 tablet 0  . nadolol (CORGARD) 20 MG tablet Take 1 tablet (20 mg total) by mouth 2 (two) times daily. THIS IS A ONE TIME ORDER.  FUTURE REFILLS NEED TO BE DONE BY PRIMARY MD. 60 tablet 1  . ondansetron (ZOFRAN) 8 MG tablet Take 1 tablet (8 mg total) by mouth every 8 (eight) hours as needed for nausea or vomiting. (Patient not taking: Reported on 03/12/2017) 45 tablet 2  . pantoprazole (PROTONIX) 40 MG tablet Take 1 tablet (40 mg total) by mouth daily. 30 tablet 3  . potassium chloride SA (K-DUR,KLOR-CON) 20 MEQ tablet Take 1 tablet (20 mEq total) by mouth daily. 14 tablet 0  . prochlorperazine (COMPAZINE) 10 MG tablet Take 1 tablet (10 mg total) by mouth every 6 (six) hours as needed for nausea or vomiting. (Patient not taking: Reported on 03/12/2017) 30 tablet 3   Current Facility-Administered Medications on File Prior to Visit  Medication Dose Route Frequency Provider Last Rate Last Dose  . 0.9 %  sodium chloride infusion   Intravenous Once Truitt Merle, MD      . ferumoxytol [facility]) 510 mg in sodium chloride 0.9 % 100 mL IVPB  510 mg Intravenous Once Truitt Merle, MD      . sodium chloride 0.9 % injection 10 mL  10 mL Intracatheter PRN Truitt Merle, MD   10 mL at 10/03/15 1705  . sodium chloride 0.9 % injection 10 mL  10 mL Intravenous PRN Truitt Merle, MD   10  mL at 05/14/16 1127   REVIEW OF SYSTEMS:   Constitutional: Denies fevers, chills or abnormal night sweats (+) increased weight loss (+) increased fatigue (+) loss of appetite (+) memory loss Eyes: Denies blurriness of vision, double vision or watery eyes Ears, nose, mouth, throat, and face: Denies mucositis, sore throat, or dry mouth Respiratory: Denies cough, dyspnea or wheezes Cardiovascular: Denies palpitation, chest discomfort or lower extremity swelling Gastrointestinal: Denies nausea, heartburn or change in bladder/ bowel habits Skin: (+) itching at port site  Lymphatics: Denies new lymphadenopathy or easy bruising Neurological:Denies numbness, tingling or new weaknesses Behavioral/Psych: Mood is stable, no new changes  All other systems were reviewed with the patient and are negative.  PHYSICAL EXAMINATION: ECOG PERFORMANCE STATUS: 3 BP: 116/66 P:71 R:16 W:133lbs.  GENERAL:alert, no distress and comfortable (+) appears slightly lost, (+) saliva drooping on his mouth SKIN: (+) Dry skin,  no skin ulcer, (+) scatter skin rashes on his neck and upper chest, some are resolving with skin pigmentation. No discharge. EYES: normal, conjunctiva are pink and non-injected, sclera clear OROPHARYNX:no exudate, no erythema and lips, buccal mucosa, and tongue with mild discoloration at the tip. The low front teeth are missing  NECK: supple, thyroid normal size, non-tender, without nodularity LYMPH:  no palpable lymphadenopathy in the cervical, axillary or inguinal LUNGS: clear to auscultation and percussion with normal breathing effort, no rales  HEART: regular rate & rhythm and no murmurs and no lower extremity edema ABDOMEN:abdomen soft, non-tender, no hepatomegaly, no splenomegaly and normal bowel sounds Musculoskeletal:no cyanosis of digits and no clubbing  PSYCH: alert & oriented x 3 with fluent speech NEURO: no focal motor/sensory  deficits   LABORATORY DATA:  I have reviewed the data as  listed CBC Latest Ref Rng & Units 05/02/2017 04/23/2017 04/16/2017  WBC 4.0 - 10.3 10e3/uL 4.3 4.0 4.5  Hemoglobin 13.0 - 17.1 g/dL 10.5(L) 7.8(L) 8.3(L)  Hematocrit 38.4 - 49.9 % 32.4(L) 23.9(L) 25.0(L)  Platelets 140 - 400 10e3/uL 94(L) 105(L) 139(L)   CMP Latest Ref Rng & Units 05/02/2017 04/23/2017 04/16/2017  Glucose 70 - 140 mg/dl 121 124 219(H)  BUN 7.0 - 26.0 mg/dL 12.4 16.2 13.3  Creatinine 0.7 - 1.3 mg/dL 0.8 1.0 0.9  Sodium 136 - 145 mEq/L 138 140 138  Potassium 3.5 - 5.1 mEq/L 3.9 4.1 4.1  Chloride 101 - 111 mmol/L - - -  CO2 22 - 29 mEq/L '26 23 22  ' Calcium 8.4 - 10.4 mg/dL 8.6 8.7 9.0  Total Protein 6.4 - 8.3 g/dL 8.3 7.4 7.5  Total Bilirubin 0.20 - 1.20 mg/dL 0.85 0.72 0.95  Alkaline Phos 40 - 150 U/L 129 114 123  AST 5 - 34 U/L 50(H) 39(H) 30  ALT 0 - 55 U/L '19 17 18   ' INITIAL tumor markers AFP 3.2 CEA > 10,000 CA 19-9 12,929.6  CEA  01/11/2015: 1809 07/05/2015: 386 10/03/2015: 66.4 02/19/2016: 155.8 05/06/2016: 566 07/08/2016: 834 08/05/2016: 1102 10/07/2016: 1733 11/06/2016: 2214.95 12/16/2016: 2858.90 01/06/2017: 3495 02/03/17: 7247 03/12/17: 5424.61 04/02/17: 3592.19 05/02/17: PENDING    PATHOLOGY Liver, needle/core biopsy 11/21/2014 - METASTATIC ADENOCARCINOMA, SEE COMMENT. Microscopic Comment The adenocarcinoma demonstrates the following immunophenotype: Cytokeratin 7 - negative expression. Cytokeratin 20 - strong diffuse expression. CD2 - strong diffuse expression. Overall the morphology and immunophenotype are that of metastatic adenocarcinoma primary to colorectum. The recent nuclear medicine scan demonstrating sigmoid mass with associated liver masses is noted.  [facility] test result:    RADIOGRAPHIC STUDIES: I have personally reviewed the outside CT scan image with patient and his son.   CT CAP w Contrast 02/03/2017 IMPRESSION: Chest Impression: 1. Interval increase in size of pulmonary nodules as well as several new pulmonary nodules consistent  with progression of pulmonary metastasis. Abdomen / Pelvis Impression: 1. Interval increase in size of hepatic metastasis within LEFT and RIGHT hepatic lobe. 2. 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 Enhanced Submucosal collections in the proximal sigmoid colon are concerning for colorectal carcinoma recurrence. Resolution of descending colitis.   ASSESSMENT & PLAN:  71 y.o. [place] male, with past history of hypertension and dilated nonischemic gammopathy with EF 25%, no clinical signs of heart failure, who was found to have hepatitis B infection lately, and multiple liver lesions on the CT scan. He has extremely high CEA and CA 19-9 levels. PET scan reviewed a hypermetabolic sigmoid colon mass, diffuse liver metastasis, probable lung and adrenal gland metastasis.  1. Metastatic sigmoid colon cancer, with diffuse liver, lungs, node and left adrenal gland metastases. KRAS/NRAS wild type, MSI-stable -Previous Liver biopsy showed metastatic adenocarcinoma. His tumor were strongly positive for CK20 and CD2, consistent with primary colorectal primary. KRAS and NRAS mutations were not detected.  -Pt understands that this is incurable cancer, and he has very high disease burden and overall prognosis is poor. The treatment goal is palliative -I previously discussed his restaging CT from 08/19/2016 which showed overall stable disease in liver, a few small lung nodules, slightly bigger, will continue monitoring. - his previous scan showed overall some improvement since we restarted on 5-FU in August 2017. -His tumor marker CEA has been trending up, concerning for slow disease progression, we'll continue monitoring -  I previously reviewed the CT scan from 02/03/2017 with the patient in detail, images reviewed in person. Unfortunately he has had significant disease progression in the liver -I recommend him to stop FOLFIRI due to disease progression. -He previously had a good response and  tolerance to FOLFOX, oxaliplatin was held after he is recurrent GI bleeding. He does not have significant residual neuropathy. I recommended him to try FOLFOX again. -He has had several incidents of pulling the needle from his port at evening, 5-fu pump infusion is not feasible anymore  -He has started CAPOX, tolerating moderately well, seems to be more fatigue, due to his overall performance status deterioration, memory loss and some mental status change, I'll hold CAPOX for now  -I will order Brain MRI today to be done early next week  -He is scheduled for restaging CT scan next week. -We'll proceed magnesium infusion and panitumumab today   2. Grade 1-2 skin rashes, stable  -Secondary to panitumumab, stable overall  -continue hydrocortisone 2.5%, and clindamycin gel 1%  twice daily as needed  -He knows to avoid sun exposure, and call me if it gets worse.  3. Gastric ulcer with significant GI bleeding in April and Aug 2016 -He is on PPI, continue once daily  -Repeat his EGD on 08/15/2015 showed near complete healing of his gastric ulcer -continue Nadolol 51m bid, per Dr. SFuller Plan- I previously encourage him to follow-up with Dr. SFuller [facility] since he has restarted 5-fu   4. Type 2 DM  -His glucose level has increased lately, it was 267 on 12/02/16. I encouraged him to watch his diet, avoid any sweets, and monitor closely at home -I previously encouraged him to follow-up with his primary care physician Dr. WJimmye [place] He is in the process to change his primary care physician. -I previously recommended him to increase metformin to 1000 mg twice daily, to better control his hyperglycemia.  5. HTN, Dilated nonischemic ischemia cardiomyopathy with EF 25% -He is clinically doing well without symptoms of CHF. However this is probably going to impact his chemotherapy.Will try to avoid cardiotoxic chemotherapy agent and avoid fluid overload during chemotherapy. -Continue follow-up  with cardiology. -will hold on his lisinopril-HCTZ for now, his BP has been normal lately (02/03/17)  6 Hepatitis B carrier, with mild portal hypertension  -Per liver clinic, no need for treatment. Follow-up with Dr. SSilvio Pate  7. Malnutrition -I previously encouraged him to eat more, and take supplements as needed. -improved, gained weight back after I reduce his chemotherapy dose  -follow up with Dietitian  -He does not want to eat. I discussed with his son to increase ensure to 2 a day and trying glucerna.  -I previously encouraged him to eat what he would like liberally as long as his side effects can tolerate it.   8. Anemia secondary to GI bleeding, iron deficiency and chemo  -Repeat lab on 06/06/2015 showed ferritin 117, serum iron 24, saturation 8%, which supports iron deficiency -He received IV Feraheme again in Aug 2016 after GI bleeding  -Repeat a ferritin was 273 on 10/03/2015, much improved  -he received iv feraheme again on 11/27/2015 and 12/04/2015, but anemia did not improve much. -His anemia has been slightly worse lately, probably related to chemotherapy, we'll close monitor any signs of GI bleeding. -Blood transfusion if hemoglobin less than 8 or symptomatic anemia with hemoglobin 8-9 -We previously discussed that the patient does not need a refill on his Folic Acid since he is not  drinking alcohol anymore.  9. hypomagnesemia  -secondary to panitumumab -he receives IV mag 6 g every week  -He is taking magnesium pill 2 tablets 3 times a day -continue monitoring   10. Goal of care discussion  -We again discussed the incurable nature of his cancer, and the overall poor prognosis, especially if he does not have good response to chemotherapy or progress on chemo -The patient understands the goal of care is palliative. -I recommend DNR/DNI, he wishes to be full code for now.   11. memory loss and confusion  -He has had more memory loss lately, his wife manage his medications  at home. -He denied headaches or other neurological symptoms. -? Chemo brain versus underlying dementia, rule out brain mets  -I'll obtain a brain MRI early next week to ruled out brain metastasis, given his worsening memory loss and confusion  Plan -Due to his overall deterioration, I'll hold chemotherapy CAPOX for now  -Order brain scan today, to be done early next week  -Has CT scan scheduled 05/07/17 -labs and f/u in 2 weeks   -Continue with Magnesium infusion every Wednesday -will continue Vectibix today and every 2 weeks    All questions were answered. The patient knows to call the clinic with any problems, questions or concerns.  I spent 25 minutes counseling the patient face to face. The total time spent in the appointment was 30 minutes and more than 50% was on counseling.  This document serves as a record of services personally performed by Truitt Merle, MD. It was created on her behalf by Joslyn Devon, a trained medical scribe. The creation of this record is based on the scribe's personal observations and the provider's statements to them. This document has been checked and approved by the attending provider.   I have reviewed the above documentation for accuracy and completeness and I agree with the above.   Truitt Merle, MD 05/02/2017

## 2017-05-02 ENCOUNTER — Other Ambulatory Visit (HOSPITAL_BASED_OUTPATIENT_CLINIC_OR_DEPARTMENT_OTHER): Payer: Medicare Other

## 2017-05-02 ENCOUNTER — Ambulatory Visit (HOSPITAL_BASED_OUTPATIENT_CLINIC_OR_DEPARTMENT_OTHER): Payer: Medicare Other | Admitting: Hematology

## 2017-05-02 ENCOUNTER — Ambulatory Visit (HOSPITAL_BASED_OUTPATIENT_CLINIC_OR_DEPARTMENT_OTHER): Payer: Medicare Other

## 2017-05-02 VITALS — BP 116/66 | HR 71 | Temp 98.5°F | Resp 16 | Wt 133.0 lb

## 2017-05-02 DIAGNOSIS — D509 Iron deficiency anemia, unspecified: Secondary | ICD-10-CM | POA: Diagnosis not present

## 2017-05-02 DIAGNOSIS — E119 Type 2 diabetes mellitus without complications: Secondary | ICD-10-CM

## 2017-05-02 DIAGNOSIS — C187 Malignant neoplasm of sigmoid colon: Secondary | ICD-10-CM | POA: Diagnosis not present

## 2017-05-02 DIAGNOSIS — C7972 Secondary malignant neoplasm of left adrenal gland: Secondary | ICD-10-CM

## 2017-05-02 DIAGNOSIS — C787 Secondary malignant neoplasm of liver and intrahepatic bile duct: Secondary | ICD-10-CM | POA: Diagnosis not present

## 2017-05-02 DIAGNOSIS — I5042 Chronic combined systolic (congestive) and diastolic (congestive) heart failure: Secondary | ICD-10-CM

## 2017-05-02 DIAGNOSIS — I10 Essential (primary) hypertension: Secondary | ICD-10-CM

## 2017-05-02 DIAGNOSIS — Z95828 Presence of other vascular implants and grafts: Secondary | ICD-10-CM

## 2017-05-02 DIAGNOSIS — Z5112 Encounter for antineoplastic immunotherapy: Secondary | ICD-10-CM | POA: Diagnosis not present

## 2017-05-02 LAB — CBC WITH DIFFERENTIAL/PLATELET
BASO%: 0.5 % (ref 0.0–2.0)
BASOS ABS: 0 10*3/uL (ref 0.0–0.1)
EOS%: 3 % (ref 0.0–7.0)
Eosinophils Absolute: 0.1 10*3/uL (ref 0.0–0.5)
HEMATOCRIT: 32.4 % — AB (ref 38.4–49.9)
HGB: 10.5 g/dL — ABNORMAL LOW (ref 13.0–17.1)
LYMPH#: 0.6 10*3/uL — AB (ref 0.9–3.3)
LYMPH%: 13.3 % — ABNORMAL LOW (ref 14.0–49.0)
MCH: 30.2 pg (ref 27.2–33.4)
MCHC: 32.4 g/dL (ref 32.0–36.0)
MCV: 93.1 fL (ref 79.3–98.0)
MONO#: 0.5 10*3/uL (ref 0.1–0.9)
MONO%: 11.7 % (ref 0.0–14.0)
NEUT%: 71.5 % (ref 39.0–75.0)
NEUTROS ABS: 3.1 10*3/uL (ref 1.5–6.5)
Platelets: 94 10*3/uL — ABNORMAL LOW (ref 140–400)
RBC: 3.48 10*6/uL — ABNORMAL LOW (ref 4.20–5.82)
RDW: 19.8 % — AB (ref 11.0–14.6)
WBC: 4.3 10*3/uL (ref 4.0–10.3)

## 2017-05-02 LAB — COMPREHENSIVE METABOLIC PANEL
ALBUMIN: 3 g/dL — AB (ref 3.5–5.0)
ALK PHOS: 129 U/L (ref 40–150)
ALT: 19 U/L (ref 0–55)
AST: 50 U/L — AB (ref 5–34)
Anion Gap: 6 mEq/L (ref 3–11)
BUN: 12.4 mg/dL (ref 7.0–26.0)
CALCIUM: 8.6 mg/dL (ref 8.4–10.4)
CO2: 26 mEq/L (ref 22–29)
CREATININE: 0.8 mg/dL (ref 0.7–1.3)
Chloride: 107 mEq/L (ref 98–109)
EGFR: >90 mL/min/{1.73_m2} (ref >90)
Glucose: 121 mg/dl (ref 70–140)
Potassium: 3.9 mEq/L (ref 3.5–5.1)
Sodium: 138 mEq/L (ref 136–145)
TOTAL PROTEIN: 8.3 g/dL (ref 6.4–8.3)
Total Bilirubin: 0.85 mg/dL (ref 0.20–1.20)

## 2017-05-02 LAB — MAGNESIUM: MAGNESIUM: 0.7 mg/dL — AB (ref 1.5–2.5)

## 2017-05-02 LAB — CEA (IN HOUSE-CHCC): CEA (CHCC-In House): 4197.89 ng/mL — ABNORMAL HIGH (ref 0.00–5.00)

## 2017-05-02 MED ORDER — HEPARIN SOD (PORK) LOCK FLUSH 100 UNIT/ML IV SOLN
500.0000 [IU] | Freq: Once | INTRAVENOUS | Status: AC | PRN
  Administered 2017-05-02: 500 [IU] via INTRAVENOUS
  Filled 2017-05-02: qty 5

## 2017-05-02 MED ORDER — SODIUM CHLORIDE 0.9 % IJ SOLN
10.0000 mL | INTRAMUSCULAR | Status: DC | PRN
  Administered 2017-05-02: 10 mL via INTRAVENOUS
  Filled 2017-05-02: qty 10

## 2017-05-02 MED ORDER — PANITUMUMAB CHEMO INJECTION 100 MG/5ML
6.2000 mg/kg | Freq: Once | INTRAVENOUS | Status: AC
  Administered 2017-05-02: 400 mg via INTRAVENOUS
  Filled 2017-05-02: qty 20

## 2017-05-02 MED ORDER — SODIUM CHLORIDE 0.9 % IV SOLN
Freq: Once | INTRAVENOUS | Status: AC
  Administered 2017-05-02: 14:00:00 via INTRAVENOUS

## 2017-05-02 MED ORDER — MAGNESIUM SULFATE 50 % IJ SOLN
Freq: Once | INTRAMUSCULAR | Status: AC
  Administered 2017-05-02: 10:00:00 via INTRAVENOUS
  Filled 2017-05-02: qty 250

## 2017-05-03 ENCOUNTER — Encounter: Payer: Self-pay | Admitting: Hematology

## 2017-05-07 ENCOUNTER — Ambulatory Visit (HOSPITAL_BASED_OUTPATIENT_CLINIC_OR_DEPARTMENT_OTHER): Payer: Medicare Other

## 2017-05-07 ENCOUNTER — Other Ambulatory Visit: Payer: Self-pay | Admitting: *Deleted

## 2017-05-07 ENCOUNTER — Ambulatory Visit (HOSPITAL_COMMUNITY)
Admission: RE | Admit: 2017-05-07 | Discharge: 2017-05-07 | Disposition: A | Discharge status: Home / Self Care | Payer: Medicare Other | Source: Ambulatory Visit | Attending: Hematology | Admitting: Hematology

## 2017-05-07 VITALS — BP 101/57 | HR 78 | Temp 97.9°F | Resp 16

## 2017-05-07 DIAGNOSIS — D7389 Other diseases of spleen: Secondary | ICD-10-CM | POA: Insufficient documentation

## 2017-05-07 DIAGNOSIS — C187 Malignant neoplasm of sigmoid colon: Secondary | ICD-10-CM | POA: Diagnosis not present

## 2017-05-07 DIAGNOSIS — K7689 Other specified diseases of liver: Secondary | ICD-10-CM | POA: Insufficient documentation

## 2017-05-07 DIAGNOSIS — Z95828 Presence of other vascular implants and grafts: Secondary | ICD-10-CM

## 2017-05-07 DIAGNOSIS — C7971 Secondary malignant neoplasm of right adrenal gland: Secondary | ICD-10-CM | POA: Diagnosis not present

## 2017-05-07 DIAGNOSIS — D509 Iron deficiency anemia, unspecified: Secondary | ICD-10-CM

## 2017-05-07 DIAGNOSIS — C801 Malignant (primary) neoplasm, unspecified: Secondary | ICD-10-CM | POA: Diagnosis not present

## 2017-05-07 LAB — CBC WITH DIFFERENTIAL/PLATELET
BASO%: 0.8 % (ref 0.0–2.0)
BASOS ABS: 0 10*3/uL (ref 0.0–0.1)
EOS%: 3.1 % (ref 0.0–7.0)
Eosinophils Absolute: 0.1 10*3/uL (ref 0.0–0.5)
HEMATOCRIT: 33.5 % — AB (ref 38.4–49.9)
HGB: 11 g/dL — ABNORMAL LOW (ref 13.0–17.1)
LYMPH#: 0.7 10*3/uL — AB (ref 0.9–3.3)
LYMPH%: 14.8 % (ref 14.0–49.0)
MCH: 30.4 pg (ref 27.2–33.4)
MCHC: 32.7 g/dL (ref 32.0–36.0)
MCV: 93 fL (ref 79.3–98.0)
MONO#: 0.4 10*3/uL (ref 0.1–0.9)
MONO%: 9 % (ref 0.0–14.0)
NEUT#: 3.4 10*3/uL (ref 1.5–6.5)
NEUT%: 72.3 % (ref 39.0–75.0)
Platelets: 112 10*3/uL — ABNORMAL LOW (ref 140–400)
RBC: 3.6 10*6/uL — AB (ref 4.20–5.82)
RDW: 20.7 % — ABNORMAL HIGH (ref 11.0–14.6)
WBC: 4.7 10*3/uL (ref 4.0–10.3)

## 2017-05-07 LAB — MAGNESIUM: Magnesium: 0.8 mg/dl — CL (ref 1.5–2.5)

## 2017-05-07 MED ORDER — IOPAMIDOL (ISOVUE-300) INJECTION 61%
100.0000 mL | Freq: Once | INTRAVENOUS | Status: AC | PRN
  Administered 2017-05-07: 100 mL via INTRAVENOUS

## 2017-05-07 MED ORDER — SODIUM CHLORIDE 0.9 % IJ SOLN
10.0000 mL | INTRAMUSCULAR | Status: DC | PRN
  Administered 2017-05-07: 10 mL via INTRAVENOUS
  Filled 2017-05-07: qty 10

## 2017-05-07 MED ORDER — HEPARIN SOD (PORK) LOCK FLUSH 100 UNIT/ML IV SOLN
INTRAVENOUS | Status: AC
  Filled 2017-05-07: qty 5

## 2017-05-07 MED ORDER — IOPAMIDOL (ISOVUE-300) INJECTION 61%
INTRAVENOUS | Status: AC
  Filled 2017-05-07: qty 100

## 2017-05-07 MED ORDER — HEPARIN SOD (PORK) LOCK FLUSH 100 UNIT/ML IV SOLN
500.0000 [IU] | Freq: Once | INTRAVENOUS | Status: AC | PRN
  Administered 2017-05-07: 500 [IU] via INTRAVENOUS
  Filled 2017-05-07: qty 5

## 2017-05-07 MED ORDER — SODIUM CHLORIDE 0.9 % IV SOLN
Freq: Once | INTRAVENOUS | Status: AC
  Administered 2017-05-07: 09:00:00 via INTRAVENOUS
  Filled 2017-05-07: qty 250

## 2017-05-07 NOTE — Progress Notes (Signed)
Magnesium level drawn, no need to wait on results prior to mag infusion per [name].

## 2017-05-07 NOTE — Patient Instructions (Signed)
Hypomagnesemia  Hypomagnesemia is a condition in which the level of magnesium in the blood is low. Magnesium is a mineral that is found in many foods. It is used in many different processes in the body. Hypomagnesemia can affect every organ in the body. It can cause life-threatening problems.  What are the causes?  Causes of hypomagnesemia include:   Not getting enough magnesium in your diet.   Malnutrition.   Problems with absorbing magnesium from the intestines.   Dehydration.   Alcohol abuse.   Vomiting.   Severe diarrhea.   Some medicines, including medicines that make you urinate more.   Certain diseases, such as kidney disease, diabetes, and overactive thyroid.    What are the signs or symptoms?   Involuntary shaking or trembling of a body part (tremor).   Confusion.   Muscle weakness.   Sensitivity to light, sound, and touch.   Psychiatric issues, such as depression, irritability, or psychosis.   Sudden tightening of muscles (muscle spasms).   Tingling in the arms and legs.   A feeling of fluttering of the heart.  These symptoms are more severe if magnesium levels drop suddenly.  How is this diagnosed?  To make a diagnosis, your health care provider will do a physical exam and order blood and urine tests.  How is this treated?  Treatment will depend on the cause and the severity of your condition. It may involve:   A magnesium supplement. This can be taken in pill form. It can also be given through an IV tube. This is usually done if the condition is severe.   Changes to your diet. You may be directed to eat foods that have a lot of magnesium, such as green leafy vegetables, peas, beans, and nuts.   Eliminating alcohol from your diet.    Follow these instructions at home:   Include foods with magnesium in your diet. Foods that are rich in magnesium include green vegetables, beans, nuts and seeds, and whole grains.   Take medicines only as directed by your health care provider.   Take  magnesium supplements if your health care provider instructs you to do that. Take them as directed.   Have your magnesium levels monitored as directed by your health care provider.   When you are active, drink fluids that contain electrolytes.   Keep all follow-up visits as directed by your health care provider. This is important.  Contact a health care provider if:   You get worse instead of better.   Your symptoms return.  Get help right away if:   Your symptoms are severe.  This information is not intended to replace advice given to you by your health care provider. Make sure you discuss any questions you have with your health care provider.  Document Released: 07/10/2005 Document Revised: 03/21/2016 Document Reviewed: 05/30/2014  Elsevier Interactive Patient Education  2018 [employer]

## 2017-05-08 ENCOUNTER — Ambulatory Visit (HOSPITAL_COMMUNITY)
Admission: RE | Admit: 2017-05-08 | Discharge: 2017-05-08 | Disposition: A | Discharge status: Home / Self Care | Payer: Medicare Other | Source: Ambulatory Visit | Attending: Hematology | Admitting: Hematology

## 2017-05-08 DIAGNOSIS — C187 Malignant neoplasm of sigmoid colon: Secondary | ICD-10-CM | POA: Diagnosis not present

## 2017-05-08 DIAGNOSIS — R410 Disorientation, unspecified: Secondary | ICD-10-CM | POA: Diagnosis not present

## 2017-05-08 DIAGNOSIS — C801 Malignant (primary) neoplasm, unspecified: Secondary | ICD-10-CM | POA: Diagnosis not present

## 2017-05-08 DIAGNOSIS — R413 Other amnesia: Secondary | ICD-10-CM | POA: Diagnosis not present

## 2017-05-08 DIAGNOSIS — C785 Secondary malignant neoplasm of large intestine and rectum: Secondary | ICD-10-CM | POA: Diagnosis not present

## 2017-05-08 MED ORDER — SODIUM CHLORIDE 0.9 % IV SOLN
Freq: Once | INTRAVENOUS | Status: DC
  Filled 2017-05-08: qty 0.5

## 2017-05-08 MED ORDER — GADOBENATE DIMEGLUMINE 529 MG/ML IV SOLN
15.0000 mL | Freq: Once | INTRAVENOUS | Status: AC | PRN
  Administered 2017-05-08: 12 mL via INTRAVENOUS

## 2017-05-09 DIAGNOSIS — I10 Essential (primary) hypertension: Secondary | ICD-10-CM | POA: Diagnosis not present

## 2017-05-09 DIAGNOSIS — D500 Iron deficiency anemia secondary to blood loss (chronic): Secondary | ICD-10-CM | POA: Diagnosis not present

## 2017-05-09 DIAGNOSIS — I429 Cardiomyopathy, unspecified: Secondary | ICD-10-CM | POA: Diagnosis not present

## 2017-05-09 DIAGNOSIS — E11610 Type 2 diabetes mellitus with diabetic neuropathic arthropathy: Secondary | ICD-10-CM | POA: Diagnosis not present

## 2017-05-09 DIAGNOSIS — C787 Secondary malignant neoplasm of liver and intrahepatic bile duct: Secondary | ICD-10-CM | POA: Diagnosis not present

## 2017-05-09 NOTE — Progress Notes (Signed)
[facility]  [phone] [phone]  Clinic follow up Note   Patient Care Team: [name], MD as PCP - General (Specialist) [name], CRNP as Nurse Practitioner (Nurse Practitioner) Truitt Merle, MD as Consulting Physician (Hematology) Ladene Artist, MD as Consulting Physician (Gastroenterology) 05/14/2017  CHIEF COMPLAINTS Follow up metastatic sigmoid colon cancer  Oncology History   He is a Metastatic colon cancer to liver   Staging form: Colon and Rectum, AJCC 7th Edition     Clinical: Stage Unknown ([place], NX, M1) - Unsigned      Cancer of sigmoid colon ([place])   10/28/2014 Tumor Marker    AFP 3.2 CEA > 10,000 CA 19-9 12,929.6. tumor (-) KRAS and NRAS mutation.       11/10/2014 Imaging    PET scan showed hypermetabolic mass in the sigmoid colon was noted metastasis in the retroperitoneum. Probable left adrenal and pulmonary metastasis, and diffuse liver metastasis.      11/21/2014 Initial Diagnosis    Metastatic colon cancer to liver, lung, abd nodes and left adrenal gland. Diagnosis was made by liver biopsy.       11/30/2014 - 05/23/2015 Chemotherapy    First line chemo [id number], Panitumumab added from second cycle, chemo held due to recurrent GI bleeding        12/28/2014 - 12/31/2014 Hospital Admission    Was admitted for dehydration, neutropenia fever with UTI, and severe skin rash.      02/08/2015 - 02/12/2015 Hospital Admission    He was admitted for upper GI bleeding, e.g. showed a gastric ulcer with clots, status post appendectomy injection. He also received a blood transfusion.      03/01/2015 Tumor Marker    CEA 694, CA19.9 462      03/13/2015 Imaging    CT CAP showed partial response, no new lesions.       05/31/2015 - 06/02/2015 Hospital Admission    He was admitted to [facility] in [place] due to upper GI bleeding, EGD showed gastric and dudenal ulcers       06/27/2015 - 06/29/2015 Hospital Admission    he was  admitted for dairrhea and pancolitis, c-diff and stool cultures were negative, treated with antibiotics      07/12/2015 - 05/14/2017 Chemotherapy    panitumumab 49m/kg, every 2 weeks      09/19/2015 - 05/14/2017 Chemotherapy    Irinotecan 1874mm2 every 2 weeks, dose reduction due to diarrhea. 5-Fu added on 06/10/2016 due to slight disease progression.   Due to his prolonged chemotherapy, I'll reduce his chemotherapy dose.  He will take Xeloda 150031mwice a day for 2 weeks, with the third week off. The patient will continue with oxali infusions every 3 weeks; Dose reduction to 33m21m for this cycles. Started 02/17/17.  Due to deterioration, hold CAPOX for now; continue vectibix today and every 2 weeks. -05/02/17      11/08/2015 - 11/10/2015 Hospital Admission    Pt was admitted for sepsis and hypotension, was found to have (+) influenza A and treated.       11/30/2015 Imaging     CT scans reviewed continued improvement in the liver metastasis, no other new lesions.      05/06/2016 Tumor Marker    CEA has gradually increased from 110 in March 2017 to 566 in July 2017      06/04/2016 Imaging    Restaging CT scan showed similar to mild increase in hepatic metastasis, mild enlargement of right adrenal  nodule, primary similar pulmonary nodules. A left upper lobe nodule has resolved.      11/12/2016 Imaging    CT CAP - Restaging IMPRESSION: 1. No significant change in the appearance of multifocal pulmonary nodules. 2. Previously referenced partially calcified liver metastases are stable from previous exam. Within the posterior right lobe of liver there is a metastasis which appears slightly increased in size from previous exam. 3. Mild decrease in size of right adrenal nodule. 4. There is mild wall thickening and inflammation involving the descending colon and proximal sigmoid colon worrisome for colitis. A small intramural fluid collection is identified which is unchanged from 08/19/2016,  significance unknown. 5. Aortic atherosclerosis.      02/03/2017 Imaging    CT CAP w Contrast Chest Impression: 1. Interval increase in size of pulmonary nodules as well as several new pulmonary nodules consistent with progression of pulmonary metastasis.  Abdomen / Pelvis Impression: 1. Interval increase in size of hepatic metastasis within LEFT and RIGHT hepatic lobe. 2. 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 Enhanced Submucosal collections in the proximal sigmoid colon are concerning for colorectal carcinoma recurrence. Resolution of descending colitis.      05/07/2017 Imaging    CT CAP W Contrast 05/07/17 IMPRESSION: Chest Impression: 1. Bilateral small pulmonary are increased in size by 2 mm. Findings consistent with tumor progression. 2. No lymphadenopathy. Abdomen / Pelvis Impression: 1. Interval increase in size of enhancing lesion in the anterior RIGHT hepatic lobe. Other hepatic lesions appear stable. 2. Decrease in size of splenic lesion. 3. Stable RIGHT adrenal metastasis. 4. Submucosal Cystic lesion the descending colon is stable. 5. No new metastatic disease in the abdomen pelvis. 6. Recanalization umbilical vein.      05/08/2017 Imaging    MRI Brain 05/08/17 IMPRESSION: No evidence of metastatic disease. Background pattern of extensive chronic small-vessel ischemic changes affecting the cerebral hemispheres. 1 cm acute infarction in the radiating white matter tracts on the right, presumably incidentally detected.       Chemotherapy    Due to growth in cancer, we changed his chemotherapy to lonsurf pill for 5 days every 2 weeks to start 05/28/17        HISTORY OF PRESENTING ILLNESS:  Jason Reilly 71 y.o. male is here because of abnormal CT findings, which is very suspicious for malignancy. He is on ranitidine from [place], has been on in the [place] for 16 years. He came in with his son and an interpreter.  He has been feeling fatigued since  two month ago. He is still able to do all ADLs. He otherwise denies any pain, bloating or nausea.  He lost about 20lbs in 3 month. His appetite is lower than before, eats less, no change of his bowl habits.  She denied any hematochezia or melana. Per his son, he has had some personality changes daily, irritable, slightly confused some time.  He was evaluated by his primary care physician. Lab test reviewed hepatitis B infection, which he did not know before, and elevated alkaline phosphatase, his liver function and the rest of the liver function was not remarkable. [place] of abdomen was obtained on 07/22/2014, which showed diffusely abnormal liver with multiple echogenic lesions. CT of abdomen with and without contrast was done on 08/26/2014, which reviewed here at a medically with multiple large partially calcified hepatic masses consistent with metastatic disease. Mild retroperitoneal adenopathy with the largest node measuring 1.6 cm. And nonspecific 1.4 cm left adrenal nodule was also noticed. His tumor  marker showed CEA greater than 10,000, CA 19-9 12,929, AFP 3.2 (normal). He was referred to [facility] and was evaluated by nurse practitioner [name]. Treatment for hepatitis B was not recommended based on his virus load.  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 He was evaluated by a cardiologist in 2014. He denies any significant dyspnea on exertion. No leg swollen.  CURRENT THERAPY:  1. lonsurf for 5 days every 2 weeks starting 05/28/17 2. He also receives IV magnesium 4-6g weekly, may stop soon    INTERIM HISTORY:  Kastiel returns for follow-up. He presents to the clinic today his son and Optometrist. He feels better since he did not get his treatment last time. He claims to have more energy, but his son says he has been the same as before. The patient says he mostly stays at home, and he will walk around occasionally. He reports  to eating fine and has normal BM, he denies chest pain.  His son reports his dad is adamant about receiving treatment.  His son says he has recently been drooling more, not aware that it is occurring.  As far his DNR his son does not think he is able to make his own decisions. They do not have a POA for him currently.    MEDICAL HISTORY:  Past Medical History:  Diagnosis Date  . Abnormal EKG   . Diabetes mellitus without complication (HCC)    metformin  . Gastric ulcer   . H. pylori infection   . Hepatitis B   . Hypertension   . Metastatic colon cancer to liver ([place])   . Non-ischemic cardiomyopathy ([place])   . Tachycardia     SURGICAL HISTORY: Past Surgical History:  Procedure Laterality Date  . ESOPHAGOGASTRODUODENOSCOPY N/A 02/08/2015   Procedure: ESOPHAGOGASTRODUODENOSCOPY (EGD);  Surgeon: Ladene Artist, MD;  Location: [facility];  Service: Endoscopy;  Laterality: N/A;  . ESOPHAGOGASTRODUODENOSCOPY (EGD) WITH PROPOFOL N/A 08/15/2015   Procedure: ESOPHAGOGASTRODUODENOSCOPY (EGD) WITH PROPOFOL;  Surgeon: Ladene Artist, MD;  Location: [facility];  Service: Endoscopy;  Laterality: N/A;  . LEFT AND RIGHT HEART CATHETERIZATION WITH CORONARY ANGIOGRAM N/A 03/29/2013   Procedure: LEFT AND RIGHT HEART CATHETERIZATION WITH CORONARY ANGIOGRAM;  Surgeon: Pixie Casino, MD;  Location: [facility] C[facility] LAB;  Service: Cardiovascular;  Laterality: N/A;  . Nuclear Stress Test  03/03/2013   High risk - consistent with nonischemic cardiomyopathy    SOCIAL HISTORY: Social History   Social History  . Marital status: Married    Spouse name: N/A  . Number of children: N/A  . Years of education: N/A   Occupational History  . Not on file.   Social History Main Topics  . Smoking status: Former Smoker    Types: Cigarettes    Quit date: 11/07/2008  . Smokeless tobacco: Never Used  . Alcohol use Yes     Comment: occasional  . Drug use: No  . Sexual activity: No   Other Topics Concern  . Not  on file   Social History Narrative   Married   Enjoys walking   Has lived in [place] > 16 years    FAMILY HISTORY: No family history of liver disease or malignancy.  ALLERGIES:  has No Known Allergies.  MEDICATIONS:  Current Outpatient Prescriptions on File Prior to Visit  Medication Sig Dispense Refill  . clindamycin (CLINDAGEL) 1 % gel Apply topically 2 (two) times daily. 30 g 2  . diphenoxylate-atropine (  LOMOTIL) 2.5-0.025 MG tablet Take 1-2 tablets by mouth 4 (four) times daily as needed for diarrhea or loose stools. 30 tablet 0  . folic acid (FOLVITE) 1 MG tablet Take 1 tablet (1 mg total) by mouth daily. 30 tablet 3  . hydrocortisone 1 % ointment Apply 1 application topically 2 (two) times daily. 56 g 0  . lidocaine-prilocaine (EMLA) cream Apply 1 application topically as needed. Apply to [facility] cath at least one and a half hour to two hours  before needle stick as needed. 30 g 2  . lisinopril-hydrochlorothiazide (PRINZIDE,ZESTORETIC) 10-12.5 MG tablet     . loperamide (IMODIUM) 2 MG capsule Take 2 capsules (4 mg total) by mouth 4 (four) times daily as needed for diarrhea or loose stools (NO MORE THAN 8 TABLETS PER DAY.). 60 capsule 3  . magic mouthwash SOLN Take 5 mLs by mouth 4 (four) times daily. 120 mL 1  . magnesium oxide (MAG-OX) 400 (241.3 Mg) MG tablet Take 1 tablet (400 mg total) by mouth 3 (three) times daily. 90 tablet 3  . metFORMIN (GLUCOPHAGE) 500 MG tablet Take 1 tablet (500 mg total) by mouth 2 (two) times daily with a meal. 60 tablet 0  . nadolol (CORGARD) 20 MG tablet Take 1 tablet (20 mg total) by mouth 2 (two) times daily. THIS IS A ONE TIME ORDER.  FUTURE REFILLS NEED TO BE DONE BY PRIMARY MD. 60 tablet 1  . pantoprazole (PROTONIX) 40 MG tablet Take 1 tablet (40 mg total) by mouth daily. 30 tablet 3  . potassium chloride SA (K-DUR,KLOR-CON) 20 MEQ tablet Take 1 tablet (20 mEq total) by mouth daily. 14 tablet 0  . prochlorperazine (COMPAZINE) 10 MG tablet Take 1  tablet (10 mg total) by mouth every 6 (six) hours as needed for nausea or vomiting. 30 tablet 3  . capecitabine (XELODA) 500 MG tablet Take 3 tablets (1,500 mg total) by mouth 2 (two) times daily after a meal. Take for 14 days on, 7 days off, repeat every 21 days (Patient not taking: Reported on 05/14/2017) 84 tablet 2  . ondansetron (ZOFRAN) 8 MG tablet Take 1 tablet (8 mg total) by mouth every 8 (eight) hours as needed for nausea or vomiting. (Patient not taking: Reported on 03/12/2017) 45 tablet 2   Current Facility-Administered Medications on File Prior to Visit  Medication Dose Route Frequency Provider Last Rate Last Dose  . 0.9 %  sodium chloride infusion   Intravenous Once Truitt Merle, MD      . ferumoxytol [facility]) 510 mg in sodium chloride 0.9 % 100 mL IVPB  510 mg Intravenous Once Truitt Merle, MD      . sodium chloride 0.9 % injection 10 mL  10 mL Intracatheter PRN Truitt Merle, MD   10 mL at 10/03/15 1705  . sodium chloride 0.9 % injection 10 mL  10 mL Intravenous PRN Truitt Merle, MD   10 mL at 05/14/16 1127  . sodium chloride 0.9 % injection 10 mL  10 mL Intravenous PRN Truitt Merle, MD   10 mL at 05/14/17 1321   REVIEW OF SYSTEMS:   Constitutional: Denies fevers, chills or abnormal night sweats (+) increased weight loss (+) increased fatigue (+) loss of appetite (+) memory loss Eyes: Denies blurriness of vision, double vision or watery eyes Ears, nose, mouth, throat, and face: Denies mucositis, sore throat, or dry mouth (+) drools unknowingly Respiratory: Denies cough, dyspnea or wheezes Cardiovascular: Denies palpitation, chest discomfort or lower extremity swelling Gastrointestinal: Denies  nausea, heartburn or change in bladder/ bowel habits Skin: (+) itching at port site  Lymphatics: Denies new lymphadenopathy or easy bruising Neurological:Denies numbness, tingling or new weaknesses Behavioral/Psych: Mood is stable, no new changes  All other systems were reviewed with the patient and are  negative.  PHYSICAL EXAMINATION: ECOG PERFORMANCE STATUS: 3  Vitals:   05/14/17 0854  BP: (!) 97/56  Pulse: 89  Resp: 18  Temp: 98.3 F (36.8 C)  TempSrc: Oral  SpO2: 99%  Weight: 132 lb 14.4 oz (60.3 kg)  Height: _0  (1.651 m)    GENERAL:alert, no distress and comfortable (+) appears slightly lost, (+) saliva drooping on his mouth SKIN: (+) Dry skin,  no skin ulcer, (+) scatter skin rashes on his neck and upper chest, some are resolving with skin pigmentation. No discharge. EYES: normal, conjunctiva are pink and non-injected, sclera clear OROPHARYNX:no exudate, no erythema and lips, buccal mucosa, and tongue with mild discoloration at the tip. The low front teeth are missing  NECK: supple, thyroid normal size, non-tender, without nodularity LYMPH:  no palpable lymphadenopathy in the cervical, axillary or inguinal LUNGS: clear to auscultation and percussion with normal breathing effort, no rales  HEART: regular rate & rhythm and no murmurs and no lower extremity edema ABDOMEN:abdomen soft, non-tender, no hepatomegaly, no splenomegaly and normal bowel sounds Musculoskeletal:no cyanosis of digits and no clubbing  PSYCH: alert & oriented x 3 with fluent speech NEURO: no focal motor/sensory deficits, but his speech and response is slow.  LABORATORY DATA:  I have reviewed the data as listed CBC Latest Ref Rng & Units 05/14/2017 05/07/2017 05/02/2017  WBC 4.0 - 10.3 10e3/uL 5.6 4.7 4.3  Hemoglobin 13.0 - 17.1 g/dL 10.5(L) 11.0(L) 10.5(L)  Hematocrit 38.4 - 49.9 % 31.8(L) 33.5(L) 32.4(L)  Platelets 140 - 400 10e3/uL 121(L) 112(L) 94(L)   CMP Latest Ref Rng & Units 05/14/2017 05/02/2017 04/23/2017  Glucose 70 - 140 mg/dl 122 121 124  BUN 7.0 - 26.0 mg/dL 14.3 12.4 16.2  Creatinine 0.7 - 1.3 mg/dL 0.9 0.8 1.0  Sodium 136 - 145 mEq/L 141 138 140  Potassium 3.5 - 5.1 mEq/L 4.4 3.9 4.1  Chloride 101 - 111 mmol/L - - -  CO2 22 - 29 mEq/L _1 Calcium 8.4 - 10.4 mg/dL 8.8 8.6 8.7    Total Protein 6.4 - 8.3 g/dL 8.4(H) 8.3 7.4  Total Bilirubin 0.20 - 1.20 mg/dL 0.77 0.85 0.72  Alkaline Phos 40 - 150 U/L 177(H) 129 114  AST 5 - 34 U/L 52(H) 50(H) 39(H)  ALT 0 - 55 U/L _2 INITIAL tumor markers AFP 3.2 CEA > 10,000 CA 19-9 12,929.6  CEA  01/11/2015: 1809 07/05/2015: 386 10/03/2015: 66.4 02/19/2016: 155.8 05/06/2016: 566 07/08/2016: 834 08/05/2016: 1102 10/07/2016: 1733 11/06/2016: 9244.62 12/16/2016: 2858.90 01/06/2017: 3495 02/03/17: 7247 03/12/17: 5424.61 04/02/17: 3592.19 05/02/17: 8638.17   PATHOLOGY Liver, needle/core biopsy 11/21/2014 - METASTATIC ADENOCARCINOMA, SEE COMMENT. Microscopic Comment The adenocarcinoma demonstrates the following immunophenotype: Cytokeratin 7 - negative expression. Cytokeratin 20 - strong diffuse expression. CD2 - strong diffuse expression. Overall the morphology and immunophenotype are that of metastatic adenocarcinoma primary to colorectum. The recent nuclear medicine scan demonstrating sigmoid mass with associated liver masses is noted.  [facility] test result:    RADIOGRAPHIC STUDIES: I have personally reviewed the outside CT scan image with patient and his son.   MRI Brain 05/08/17 IMPRESSION: No evidence of metastatic disease. Background pattern of extensive chronic small-vessel ischemic changes affecting the  cerebral hemispheres. 1 cm acute infarction in the radiating white matter tracts on the right, presumably incidentally detected.   CT CAP W Contrast 05/07/17 IMPRESSION: Chest Impression: 1. Bilateral small pulmonary are increased in size by 2 mm. Findings consistent with tumor progression. 2. No lymphadenopathy. Abdomen / Pelvis Impression: 1. Interval increase in size of enhancing lesion in the anterior RIGHT hepatic lobe. Other hepatic lesions appear stable. 2. Decrease in size of splenic lesion. 3. Stable RIGHT adrenal metastasis. 4. Submucosal Cystic lesion the descending colon is  stable. 5. No new metastatic disease in the abdomen pelvis. 6. Recanalization umbilical vein.   CT CAP w Contrast 02/03/2017 IMPRESSION: Chest Impression: 1. Interval increase in size of pulmonary nodules as well as several new pulmonary nodules consistent with progression of pulmonary metastasis. Abdomen / Pelvis Impression: 1. Interval increase in size of hepatic metastasis within LEFT and RIGHT hepatic lobe. 2. 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 Enhanced Submucosal collections in the proximal sigmoid colon are concerning for colorectal carcinoma recurrence. Resolution of descending colitis.   ASSESSMENT & PLAN:  71 y.o. [place] male, with past history of hypertension and dilated nonischemic gammopathy with EF 25%, no clinical signs of heart failure, who was found to have hepatitis B infection lately, and multiple liver lesions on the CT scan. He has extremely high CEA and CA 19-9 levels. PET scan reviewed a hypermetabolic sigmoid colon mass, diffuse liver metastasis, probable lung and adrenal gland metastasis.  1. Metastatic sigmoid colon cancer, with diffuse liver, lungs, node and left adrenal gland metastases. KRAS/NRAS wild type, MSI-stable -Previous Liver biopsy showed metastatic adenocarcinoma. His tumor were strongly positive for CK20 and CD2, consistent with primary colorectal primary. KRAS and NRAS mutations were not detected.  -Pt understands that this is incurable cancer, and he has very high disease burden and overall prognosis is poor. The treatment goal is palliative -I previously discussed his restaging CT from 08/19/2016 which showed overall stable disease in liver, a few small lung nodules, slightly bigger, will continue monitoring. - his previous scan showed overall some improvement since we restarted on 5-FU in August 2017. -His tumor marker CEA has been trending up, concerning for slow disease progression, we'll continue monitoring -I previously  reviewed the CT scan from 02/03/2017 with the patient in detail, images reviewed in person. Unfortunately he has had significant disease progression in the liver -He has had FOLFOX and FOLFIRI, and CAPOX, plus panitumumab  -We reviewed his recent MRI brian scan that shows no evidence of metastasis. I believe his confusion is related to chemo brain or possible underline dementia.  -we also reviewed his restating CT scan from 05/07/2017 which showed evidence of cancer progression in the liver, and multiple small lung nodules slightly increased in size. Due to the cancer progression, and overall deteriorated performance status recently, I recommend him to stop CAPOX and panitumumab  -We discussed the options for third and fourth line treatment, which is limited, and response rate in the benefit are likely to be small.  -Due to his deteriorating of his performance status, palliative care and hospice is also reasonable at this stage, I encouraged patient and his family to consider. -After lengthy discussion, patient and his son decided to try second line treatment. -I suggest lonsurf for 5 days every 2 weeks for treatment if he wishes to proceed. I discussed the side effects, which includes but not limited to, fatigue, pancytopenia, liver dysfunction, patient agrees to proceed. I'll send a prescription today. -The patient  would like to proceed with treatment and will start in approximately 2 weeks   -We will stop Vectibix  And IV chemo therapy today -Will f/u in 3 weeks    2. Grade 1-2 skin rashes, stable  -Secondary to panitumumab, stable overall  -continue hydrocortisone 2.5%, and clindamycin gel 1%  twice daily as needed  -He knows to avoid sun exposure, and call me if it gets worse. -Stopped panitumumab and will monitor rash recovery  3. Gastric ulcer with significant GI bleeding in April and Aug 2016 -He is on PPI, continue once daily  -Repeat his EGD on 08/15/2015 showed near complete healing of  his gastric ulcer -continue Nadolol 12m bid, per Dr. SFuller Plan- I previously encourage him to follow-up with Dr. [facility]uller [facility] since he has restarted 5-fu   4. Type 2 DM  -His glucose level has increased lately, it was 267 on 12/02/16. I encouraged him to watch his diet, avoid any sweets, and monitor closely at home -I previously encouraged him to follow-up with his primary care physician Dr. WJimmye [place] He is in the process to change his primary care physician. -I previously recommended him to increase metformin to 1000 mg twice daily, to better control his hyperglycemia.  5. HTN, Dilated nonischemic ischemia cardiomyopathy with EF 25% -He is clinically doing well without symptoms of CHF. However this is probably going to impact his chemotherapy.Will try to avoid cardiotoxic chemotherapy agent and avoid fluid overload during chemotherapy. -Continue follow-up with cardiology. -will hold on his lisinopril-HCTZ for now, his BP has been normal lately (02/03/17)  6 Hepatitis B carrier, with mild portal hypertension  -Per liver clinic, no need for treatment. Follow-up with Dr. SSilvio Pate  7. Malnutrition -I previously encouraged him to eat more, and take supplements as needed. -improved, gained weight back after I reduce his chemotherapy dose  -follow up with Dietitian  -He does not want to eat. I discussed with his son to increase ensure to 2 a day and trying glucerna.  -I previously encouraged him to eat what he would like liberally as long as his side effects can tolerate it.   8. Anemia secondary to GI bleeding, iron deficiency and chemo  -Repeat lab on 06/06/2015 showed ferritin 117, serum iron 24, saturation 8%, which supports iron deficiency -He received IV Feraheme again in Aug 2016 after GI bleeding  -Repeat a ferritin was 273 on 10/03/2015, much improved  -he received iv feraheme again on 11/27/2015 and 12/04/2015, but anemia did not improve much. -His anemia has been slightly  worse lately, probably related to chemotherapy, we'll close monitor any signs of GI bleeding. -Blood transfusion if hemoglobin less than 8 or symptomatic anemia with hemoglobin 8-9 -We previously discussed that the patient does not need a refill on his Folic Acid since he is not drinking alcohol anymore.  9. hypomagnesemia  -secondary to panitumumab -he receives IV mag 6 g every week  -He is taking magnesium pill 2 tablets 3 times a day -continue monitoring  -Stopped panitumumab and will monitor recovery   10. Goal of care discussion, DNR/DNI -We again discussed the incurable nature of his cancer, and the overall poor prognosis, especially if he does not have good response to chemotherapy or progress on chemo -The patient understands the goal of care is palliative. -I again recommended DNR/DNI, he finally agreed today. -I suggest that if he is not able to make the decision himself he can appoint a POA to make the decision.  -  I will refer to social worker for help with paperwork.   11. memory loss and confusion  -He has had more memory loss lately, his wife manage his medications at home. -He denied headaches or other neurological symptoms. -? Chemo brain versus underlying dementia, rule out brain mets  -I ordered a brain MRI to ruled out brain metastasis, given his worsening memory loss and confusion -With 05/08/17 Brain MRI, Brain mets have been ruled out.    Plan -Contnue with Magnesium infusion today and stop Vectibix and CAPOX -I ordered lonsurf today, he will start as soon as he receives the medication. -Lab, flush and Magnesium 3 hrs in 2 and 3 weeks  -Lab flush and f/u in 3 weeks  -SW referral for POA paperwork    All questions were answered. The patient knows to call the clinic with any problems, questions or concerns.  I spent 25 minutes counseling the patient face to face. The total time spent in the appointment was 30 minutes and more than 50% was on counseling.  This  document serves as a record of services personally performed by Truitt Merle, MD. It was created on her behalf by Joslyn Devon, a trained medical scribe. The creation of this record is based on the scribe's personal observations and the provider's statements to them. This document has been checked and approved by the attending provider.   I have reviewed the above documentation for accuracy and completeness and I agree with the above.   Truitt Merle, MD 05/14/2017

## 2017-05-14 ENCOUNTER — Ambulatory Visit (HOSPITAL_BASED_OUTPATIENT_CLINIC_OR_DEPARTMENT_OTHER): Payer: Medicare Other

## 2017-05-14 ENCOUNTER — Other Ambulatory Visit (HOSPITAL_BASED_OUTPATIENT_CLINIC_OR_DEPARTMENT_OTHER): Payer: Medicare Other

## 2017-05-14 ENCOUNTER — Other Ambulatory Visit: Payer: Self-pay | Admitting: Hematology

## 2017-05-14 ENCOUNTER — Ambulatory Visit (HOSPITAL_BASED_OUTPATIENT_CLINIC_OR_DEPARTMENT_OTHER): Payer: Medicare Other | Admitting: Hematology

## 2017-05-14 ENCOUNTER — Telehealth: Payer: Self-pay | Admitting: Hematology

## 2017-05-14 ENCOUNTER — Ambulatory Visit: Payer: Medicare Other

## 2017-05-14 ENCOUNTER — Encounter: Payer: Self-pay | Admitting: Hematology

## 2017-05-14 VITALS — BP 97/56 | HR 89 | Temp 98.3°F | Resp 18 | Ht 65.0 in | Wt 132.9 lb

## 2017-05-14 DIAGNOSIS — C187 Malignant neoplasm of sigmoid colon: Secondary | ICD-10-CM | POA: Diagnosis not present

## 2017-05-14 DIAGNOSIS — I5042 Chronic combined systolic (congestive) and diastolic (congestive) heart failure: Secondary | ICD-10-CM

## 2017-05-14 DIAGNOSIS — D509 Iron deficiency anemia, unspecified: Secondary | ICD-10-CM

## 2017-05-14 DIAGNOSIS — C787 Secondary malignant neoplasm of liver and intrahepatic bile duct: Secondary | ICD-10-CM

## 2017-05-14 DIAGNOSIS — C189 Malignant neoplasm of colon, unspecified: Secondary | ICD-10-CM

## 2017-05-14 DIAGNOSIS — I10 Essential (primary) hypertension: Secondary | ICD-10-CM

## 2017-05-14 DIAGNOSIS — Z95828 Presence of other vascular implants and grafts: Secondary | ICD-10-CM

## 2017-05-14 DIAGNOSIS — D6481 Anemia due to antineoplastic chemotherapy: Secondary | ICD-10-CM | POA: Diagnosis not present

## 2017-05-14 DIAGNOSIS — E46 Unspecified protein-calorie malnutrition: Secondary | ICD-10-CM | POA: Diagnosis not present

## 2017-05-14 DIAGNOSIS — C786 Secondary malignant neoplasm of retroperitoneum and peritoneum: Secondary | ICD-10-CM | POA: Diagnosis not present

## 2017-05-14 DIAGNOSIS — C7972 Secondary malignant neoplasm of left adrenal gland: Secondary | ICD-10-CM

## 2017-05-14 DIAGNOSIS — E119 Type 2 diabetes mellitus without complications: Secondary | ICD-10-CM

## 2017-05-14 LAB — CBC WITH DIFFERENTIAL/PLATELET
BASO%: 0.6 % (ref 0.0–2.0)
Basophils Absolute: 0 10*3/uL (ref 0.0–0.1)
EOS%: 3 % (ref 0.0–7.0)
Eosinophils Absolute: 0.2 10*3/uL (ref 0.0–0.5)
HCT: 31.8 % — ABNORMAL LOW (ref 38.4–49.9)
HEMOGLOBIN: 10.5 g/dL — AB (ref 13.0–17.1)
LYMPH%: 13.4 % — ABNORMAL LOW (ref 14.0–49.0)
MCH: 30.5 pg (ref 27.2–33.4)
MCHC: 33 g/dL (ref 32.0–36.0)
MCV: 92.5 fL (ref 79.3–98.0)
MONO#: 0.6 10*3/uL (ref 0.1–0.9)
MONO%: 10.7 % (ref 0.0–14.0)
NEUT%: 72.3 % (ref 39.0–75.0)
NEUTROS ABS: 4 10*3/uL (ref 1.5–6.5)
Platelets: 121 10*3/uL — ABNORMAL LOW (ref 140–400)
RBC: 3.44 10*6/uL — ABNORMAL LOW (ref 4.20–5.82)
RDW: 20.2 % — AB (ref 11.0–14.6)
WBC: 5.6 10*3/uL (ref 4.0–10.3)
lymph#: 0.8 10*3/uL — ABNORMAL LOW (ref 0.9–3.3)

## 2017-05-14 LAB — COMPREHENSIVE METABOLIC PANEL
ALBUMIN: 3.1 g/dL — AB (ref 3.5–5.0)
ALK PHOS: 177 U/L — AB (ref 40–150)
ALT: 23 U/L (ref 0–55)
AST: 52 U/L — AB (ref 5–34)
Anion Gap: 10 mEq/L (ref 3–11)
BILIRUBIN TOTAL: 0.77 mg/dL (ref 0.20–1.20)
BUN: 14.3 mg/dL (ref 7.0–26.0)
CO2: 25 mEq/L (ref 22–29)
Calcium: 8.8 mg/dL (ref 8.4–10.4)
Chloride: 106 mEq/L (ref 98–109)
Creatinine: 0.9 mg/dL (ref 0.7–1.3)
EGFR: 83 mL/min/{1.73_m2} — ABNORMAL LOW (ref >90)
Glucose: 122 mg/dl (ref 70–140)
POTASSIUM: 4.4 meq/L (ref 3.5–5.1)
SODIUM: 141 meq/L (ref 136–145)
TOTAL PROTEIN: 8.4 g/dL — AB (ref 6.4–8.3)

## 2017-05-14 LAB — MAGNESIUM: Magnesium: 0.8 mg/dl — CL (ref 1.5–2.5)

## 2017-05-14 MED ORDER — SODIUM CHLORIDE 0.9 % IJ SOLN
10.0000 mL | INTRAMUSCULAR | Status: DC | PRN
  Administered 2017-05-14: 10 mL via INTRAVENOUS
  Filled 2017-05-14: qty 10

## 2017-05-14 MED ORDER — TRIFLURIDINE-TIPIRACIL 20-8.19 MG PO TABS: 35.0000 mg/m2 | ORAL_TABLET | Freq: Two times a day (BID) | ORAL | 2 refills | Status: DC

## 2017-05-14 MED ORDER — HEPARIN SOD (PORK) LOCK FLUSH 100 UNIT/ML IV SOLN
500.0000 [IU] | Freq: Once | INTRAVENOUS | Status: AC | PRN
  Administered 2017-05-14: 500 [IU] via INTRAVENOUS
  Filled 2017-05-14: qty 5

## 2017-05-14 MED ORDER — SODIUM CHLORIDE 0.9 % IV SOLN
Freq: Once | INTRAVENOUS | Status: AC
  Administered 2017-05-14: 10:00:00 via INTRAVENOUS
  Filled 2017-05-14: qty 250

## 2017-05-14 NOTE — Patient Instructions (Signed)
Hypomagnesemia  Hypomagnesemia is a condition in which the level of magnesium in the blood is low. Magnesium is a mineral that is found in many foods. It is used in many different processes in the body. Hypomagnesemia can affect every organ in the body. It can cause life-threatening problems.  What are the causes?  Causes of hypomagnesemia include:   Not getting enough magnesium in your diet.   Malnutrition.   Problems with absorbing magnesium from the intestines.   Dehydration.   Alcohol abuse.   Vomiting.   Severe diarrhea.   Some medicines, including medicines that make you urinate more.   Certain diseases, such as kidney disease, diabetes, and overactive thyroid.    What are the signs or symptoms?   Involuntary shaking or trembling of a body part (tremor).   Confusion.   Muscle weakness.   Sensitivity to light, sound, and touch.   Psychiatric issues, such as depression, irritability, or psychosis.   Sudden tightening of muscles (muscle spasms).   Tingling in the arms and legs.   A feeling of fluttering of the heart.  These symptoms are more severe if magnesium levels drop suddenly.  How is this diagnosed?  To make a diagnosis, your health care provider will do a physical exam and order blood and urine tests.  How is this treated?  Treatment will depend on the cause and the severity of your condition. It may involve:   A magnesium supplement. This can be taken in pill form. It can also be given through an IV tube. This is usually done if the condition is severe.   Changes to your diet. You may be directed to eat foods that have a lot of magnesium, such as green leafy vegetables, peas, beans, and nuts.   Eliminating alcohol from your diet.    Follow these instructions at home:   Include foods with magnesium in your diet. Foods that are rich in magnesium include green vegetables, beans, nuts and seeds, and whole grains.   Take medicines only as directed by your health care provider.   Take  magnesium supplements if your health care provider instructs you to do that. Take them as directed.   Have your magnesium levels monitored as directed by your health care provider.   When you are active, drink fluids that contain electrolytes.   Keep all follow-up visits as directed by your health care provider. This is important.  Contact a health care provider if:   You get worse instead of better.   Your symptoms return.  Get help right away if:   Your symptoms are severe.  This information is not intended to replace advice given to you by your health care provider. Make sure you discuss any questions you have with your health care provider.  Document Released: 07/10/2005 Document Revised: 03/21/2016 Document Reviewed: 05/30/2014  Elsevier Interactive Patient Education  2018 [employer]

## 2017-05-14 NOTE — Telephone Encounter (Signed)
Scheduled appt epr 7/18 los - Gave patient AVS and calender per los.

## 2017-05-14 NOTE — Patient Instructions (Signed)
Implanted Port Home Guide An implanted port is a type of central line that is placed under the skin. Central lines are used to provide IV access when treatment or nutrition needs to be given through a person's veins. Implanted ports are used for long-term IV access. An implanted port may be placed because:  You need IV medicine that would be irritating to the small veins in your hands or arms.  You need long-term IV medicines, such as antibiotics.  You need IV nutrition for a long period.  You need frequent blood draws for lab tests.  You need dialysis.  Implanted ports are usually placed in the chest area, but they can also be placed in the upper arm, the abdomen, or the leg. An implanted port has two main parts:  Reservoir. The reservoir is round and will appear as a small, raised area under your skin. The reservoir is the part where a needle is inserted to give medicines or draw blood.  Catheter. The catheter is a thin, flexible tube that extends from the reservoir. The catheter is placed into a large vein. Medicine that is inserted into the reservoir goes into the catheter and then into the vein.  How will I care for my incision site? Do not get the incision site wet. Bathe or shower as directed by your health care provider. How is my port accessed? Special steps must be taken to access the port:  Before the port is accessed, a numbing cream can be placed on the skin. This helps numb the skin over the port site.  Your health care provider uses a sterile technique to access the port. ? Your health care provider must put on a mask and sterile gloves. ? The skin over your port is cleaned carefully with an antiseptic and allowed to dry. ? The port is gently pinched between sterile gloves, and a needle is inserted into the port.  Only "non-coring" port needles should be used to access the port. Once the port is accessed, a blood return should be checked. This helps ensure that the port  is in the vein and is not clogged.  If your port needs to remain accessed for a constant infusion, a clear (transparent) bandage will be placed over the needle site. The bandage and needle will need to be changed every week, or as directed by your health care provider.  Keep the bandage covering the needle clean and dry. Do not get it wet. Follow your health care provider's instructions on how to take a shower or bath while the port is accessed.  If your port does not need to stay accessed, no bandage is needed over the port.  What is flushing? Flushing helps keep the port from getting clogged. Follow your health care provider's instructions on how and when to flush the port. Ports are usually flushed with saline solution or a medicine called heparin. The need for flushing will depend on how the port is used.  If the port is used for intermittent medicines or blood draws, the port will need to be flushed: ? After medicines have been given. ? After blood has been drawn. ? As part of routine maintenance.  If a constant infusion is running, the port may not need to be flushed.  How long will my port stay implanted? The port can stay in for as long as your health care provider thinks it is needed. When it is time for the port to come out, surgery will be   done to remove it. The procedure is similar to the one performed when the port was put in. When should I seek immediate medical care? When you have an implanted port, you should seek immediate medical care if:  You notice a bad smell coming from the incision site.  You have swelling, redness, or drainage at the incision site.  You have more swelling or pain at the port site or the surrounding area.  You have a fever that is not controlled with medicine.  This information is not intended to replace advice given to you by your health care provider. Make sure you discuss any questions you have with your health care provider. Document  Released: 10/14/2005 Document Revised: 03/21/2016 Document Reviewed: 06/21/2013 Elsevier Interactive Patient Education  2017 [employer]  

## 2017-05-20 ENCOUNTER — Telehealth: Payer: Self-pay | Admitting: Pharmacist

## 2017-05-20 DIAGNOSIS — C187 Malignant neoplasm of sigmoid colon: Secondary | ICD-10-CM

## 2017-05-20 MED ORDER — TRIFLURIDINE-TIPIRACIL 20-8.19 MG PO TABS: ORAL_TABLET | ORAL | 2 refills | Status: DC

## 2017-05-20 NOTE — Telephone Encounter (Signed)
Oral Chemotherapy Pharmacist Encounter Attempted to reach patient's son to provide oral medication education:No answer. Left VM for patient's son to call back.    Thank you,  Nuala Alpha, PharmD, [facility] [phone]

## 2017-05-20 NOTE — Telephone Encounter (Signed)
Oral Oncology Pharmacist Encounter  Received new prescription for Lonsurf (trifluridine-tipiracil) for the treatment of metastatic colorectal cancer, planned duration until disease progression on unacceptable toxicity.  Labs from 05/14/17 assessed, plts 121 but improved from previous CBC and AST slightly elevated. Labs okay to start therapy, patient is currently scheduled for repeat labs with next MD visit.   Current medication list in Epic reviewed, no DDIs with Lonsurf identified  Prescription has been e-scribed to the [facility] for benefits analysis and approval.  Prescription required PA, PA was approved #[id number] until 10/27/2017. Test claim run for Rx showed copay of $3.36 for 30 day supply.   Oral Oncology Clinic will continue to follow for copayment issues, initial counseling and start date.  Nuala Alpha, PharmD, BCPS 05/20/2017 2:12 PM Oral Oncology Clinic [phone]

## 2017-05-22 NOTE — Telephone Encounter (Signed)
Oral Chemotherapy Pharmacist Encounter Attempted to reach patient's son to provide oral medication education:No answer. Left VM for patient's son to call back.    Thank you,  Nuala Alpha, PharmD, BCPS 05/22/2017 9:33 AM Oral Oncology Clinic [phone]

## 2017-05-28 ENCOUNTER — Other Ambulatory Visit (HOSPITAL_BASED_OUTPATIENT_CLINIC_OR_DEPARTMENT_OTHER): Payer: Medicare Other

## 2017-05-28 ENCOUNTER — Encounter: Payer: Self-pay | Admitting: *Deleted

## 2017-05-28 ENCOUNTER — Ambulatory Visit (HOSPITAL_BASED_OUTPATIENT_CLINIC_OR_DEPARTMENT_OTHER): Payer: Medicare Other

## 2017-05-28 VITALS — BP 101/62 | HR 89 | Temp 98.3°F | Resp 16

## 2017-05-28 DIAGNOSIS — Z8719 Personal history of other diseases of the digestive system: Secondary | ICD-10-CM | POA: Diagnosis not present

## 2017-05-28 DIAGNOSIS — D509 Iron deficiency anemia, unspecified: Secondary | ICD-10-CM

## 2017-05-28 DIAGNOSIS — I429 Cardiomyopathy, unspecified: Secondary | ICD-10-CM | POA: Diagnosis not present

## 2017-05-28 DIAGNOSIS — C187 Malignant neoplasm of sigmoid colon: Secondary | ICD-10-CM

## 2017-05-28 DIAGNOSIS — I10 Essential (primary) hypertension: Secondary | ICD-10-CM | POA: Diagnosis not present

## 2017-05-28 DIAGNOSIS — Z95828 Presence of other vascular implants and grafts: Secondary | ICD-10-CM

## 2017-05-28 DIAGNOSIS — E11610 Type 2 diabetes mellitus with diabetic neuropathic arthropathy: Secondary | ICD-10-CM | POA: Diagnosis not present

## 2017-05-28 LAB — MAGNESIUM: Magnesium: 1 mg/dl — CL (ref 1.5–2.5)

## 2017-05-28 LAB — COMPREHENSIVE METABOLIC PANEL
ALT: 17 U/L (ref 0–55)
AST: 43 U/L — AB (ref 5–34)
Albumin: 3.1 g/dL — ABNORMAL LOW (ref 3.5–5.0)
Alkaline Phosphatase: 184 U/L — ABNORMAL HIGH (ref 40–150)
Anion Gap: 9 mEq/L (ref 3–11)
BILIRUBIN TOTAL: 1.06 mg/dL (ref 0.20–1.20)
BUN: 15.2 mg/dL (ref 7.0–26.0)
CHLORIDE: 105 meq/L (ref 98–109)
CO2: 23 meq/L (ref 22–29)
CREATININE: 1 mg/dL (ref 0.7–1.3)
Calcium: 9.1 mg/dL (ref 8.4–10.4)
EGFR: 77 mL/min/{1.73_m2} — ABNORMAL LOW (ref >90)
GLUCOSE: 124 mg/dL (ref 70–140)
Potassium: 4.1 mEq/L (ref 3.5–5.1)
Sodium: 137 mEq/L (ref 136–145)
TOTAL PROTEIN: 8.2 g/dL (ref 6.4–8.3)

## 2017-05-28 LAB — CBC WITH DIFFERENTIAL/PLATELET
BASO%: 0.2 % (ref 0.0–2.0)
Basophils Absolute: 0 10*3/uL (ref 0.0–0.1)
EOS%: 2.8 % (ref 0.0–7.0)
Eosinophils Absolute: 0.1 10*3/uL (ref 0.0–0.5)
HCT: 26.2 % — ABNORMAL LOW (ref 38.4–49.9)
HGB: 8.7 g/dL — ABNORMAL LOW (ref 13.0–17.1)
LYMPH#: 0.6 10*3/uL — AB (ref 0.9–3.3)
LYMPH%: 11.7 % — AB (ref 14.0–49.0)
MCH: 30.1 pg (ref 27.2–33.4)
MCHC: 33.2 g/dL (ref 32.0–36.0)
MCV: 90.7 fL (ref 79.3–98.0)
MONO#: 0.5 10*3/uL (ref 0.1–0.9)
MONO%: 10.9 % (ref 0.0–14.0)
NEUT%: 74.4 % (ref 39.0–75.0)
NEUTROS ABS: 3.7 10*3/uL (ref 1.5–6.5)
PLATELETS: 92 10*3/uL — AB (ref 140–400)
RBC: 2.89 10*6/uL — AB (ref 4.20–5.82)
RDW: 16.6 % — ABNORMAL HIGH (ref 11.0–14.6)
WBC: 5 10*3/uL (ref 4.0–10.3)

## 2017-05-28 MED ORDER — SODIUM CHLORIDE 0.9 % IJ SOLN
10.0000 mL | INTRAMUSCULAR | Status: DC | PRN
  Administered 2017-05-28: 10 mL via INTRAVENOUS
  Filled 2017-05-28: qty 10

## 2017-05-28 MED ORDER — ALTEPLASE 2 MG IJ SOLR
2.0000 mg | Freq: Once | INTRAMUSCULAR | Status: DC | PRN
  Filled 2017-05-28: qty 2

## 2017-05-28 MED ORDER — SODIUM CHLORIDE 0.9 % IV SOLN
6.0000 g | Freq: Once | INTRAVENOUS | Status: AC
  Administered 2017-05-28: 6 g via INTRAVENOUS
  Filled 2017-05-28: qty 250

## 2017-05-28 MED ORDER — HEPARIN SOD (PORK) LOCK FLUSH 100 UNIT/ML IV SOLN
500.0000 [IU] | Freq: Once | INTRAVENOUS | Status: AC | PRN
  Administered 2017-05-28: 500 [IU] via INTRAVENOUS
  Filled 2017-05-28: qty 5

## 2017-05-28 NOTE — Telephone Encounter (Signed)
Oral Chemotherapy Pharmacist Encounter  I met with patient and son in [facility] lobby for overview of new oral chemotherapy medication Lonsurf for the treatment of metastatic colorectal cancer that had progressed on multiple lines of previous therapy, planned duration until disease progression or unacceptable toxicity.  Patient's son stated that patient and family were ready to take a treatment break. They are in clinic today for magnesium infusion but do not with to pursue further chemotherapeutic treatment at this time. I told son I would notify MD of our conversation.  Phone number to oral oncology clinic provided to son for future questions.  Patient knows to call the office with questions or concerns.   No further needs from [facility] identified at this time. Oral Oncology Clinic will sign off. Please let us know if we can be of assistance in the future.  Thank you,  Johny Drilling, PharmD, BCPS, BCOP 05/28/2017  8:52 AM [facility] Oncology Clinic [phone]

## 2017-05-28 NOTE — Progress Notes (Signed)
[place] [facility]  Clinical Social Work was referred by Futures trader for assessment of psychosocial needs and to complete ADRS/HCPOA paperwork.  Clinical Social Worker reviewed chart and patient completed HCPOA paperwork on 01/15/16 and there is a copy in the EMR under the media tab. It was unclear that this had been previously completed.   Loren Racer, LCSW, OSW-C Clinical Social Worker [place]  [place] Phone: [phone] Fax: [phone]

## 2017-05-28 NOTE — Patient Instructions (Signed)
Magnesium Sulfate injection What is this medicine? MAGNESIUM SULFATE (mag NEE zee um SUL fate) is an electrolyte injection commonly used to treat low magnesium levels in your blood. It is also used to prevent or control seizures in women with preeclampsia or eclampsia. This medicine may be used for other purposes; ask your health care provider or pharmacist if you have questions. What should I tell my health care provider before I take this medicine? They need to know if you have any of these conditions: -heart disease -history of irregular heart beat -kidney disease -an unusual or allergic reaction to magnesium sulfate, medicines, foods, dyes, or preservatives -pregnant or trying to get pregnant -breast-feeding How should I use this medicine? This medicine is for infusion into a vein. It is given by a health care professional in a hospital or clinic setting. Talk to your pediatrician regarding the use of this medicine in children. While this drug may be prescribed for selected conditions, precautions do apply. Overdosage: If you think you have taken too much of this medicine contact a poison control center or emergency room at once. NOTE: This medicine is only for you. Do not share this medicine with others. What if I miss a dose? This does not apply. What may interact with this medicine? This medicine may interact with the following medications: -certain medicines for anxiety or sleep -certain medicines for seizures like phenobarbital -digoxin -medicines that relax muscles for surgery -narcotic medicines for pain This list may not describe all possible interactions. Give your health care provider a list of all the medicines, herbs, non-prescription drugs, or dietary supplements you use. Also tell them if you smoke, drink alcohol, or use illegal drugs. Some items may interact with your medicine. What should I watch for while using this medicine? Your condition will be monitored carefully  while you are receiving this medicine. You may need blood work done while you are receiving this medicine. What side effects may I notice from receiving this medicine? Side effects that you should report to your doctor or health care professional as soon as possible: -allergic reactions like skin rash, itching or hives, swelling of the face, lips, or tongue -facial flushing -muscle weakness -signs and symptoms of low blood pressure like dizziness; feeling faint or lightheaded, falls; unusually weak or tired -signs and symptoms of a dangerous change in heartbeat or heart rhythm like chest pain; dizziness; fast or irregular heartbeat; palpitations; breathing problems -sweating This list may not describe all possible side effects. Call your doctor for medical advice about side effects. You may report side effects to FDA at 1-800-FDA-1088. Where should I keep my medicine? This drug is given in a hospital or clinic and will not be stored at home. NOTE: This sheet is a summary. It may not cover all possible information. If you have questions about this medicine, talk to your doctor, pharmacist, or health care provider.  2018 Elsevier/Gold Standard (2016-05-01 12:31:42)    Hypomagnesemia Hypomagnesemia is a condition in which the level of magnesium in the blood is low. Magnesium is a mineral that is found in many foods. It is used in many different processes in the body. Hypomagnesemia can affect every organ in the body. It can cause life-threatening problems. What are the causes? Causes of hypomagnesemia include:  Not getting enough magnesium in your diet.  Malnutrition.  Problems with absorbing magnesium from the intestines.  Dehydration.  Alcohol abuse.  Vomiting.  Severe diarrhea.  Some medicines, including medicines that make you urinate  more.  Certain diseases, such as kidney disease, diabetes, and overactive thyroid.  What are the signs or symptoms?  Involuntary shaking or  trembling of a body part (tremor).  Confusion.  Muscle weakness.  Sensitivity to light, sound, and touch.  Psychiatric issues, such as depression, irritability, or psychosis.  Sudden tightening of muscles (muscle spasms).  Tingling in the arms and legs.  A feeling of fluttering of the heart. These symptoms are more severe if magnesium levels drop suddenly. How is this diagnosed? To make a diagnosis, your health care provider will do a physical exam and order blood and urine tests. How is this treated? Treatment will depend on the cause and the severity of your condition. It may involve:  A magnesium supplement. This can be taken in pill form. It can also be given through an IV tube. This is usually done if the condition is severe.  Changes to your diet. You may be directed to eat foods that have a lot of magnesium, such as green leafy vegetables, peas, beans, and nuts.  Eliminating alcohol from your diet.  Follow these instructions at home:  Include foods with magnesium in your diet. Foods that are rich in magnesium include green vegetables, beans, nuts and seeds, and whole grains.  Take medicines only as directed by your health care provider.  Take magnesium supplements if your health care provider instructs you to do that. Take them as directed.  Have your magnesium levels monitored as directed by your health care provider.  When you are active, drink fluids that contain electrolytes.  Keep all follow-up visits as directed by your health care provider. This is important. Contact a health care provider if:  You get worse instead of better.  Your symptoms return. Get help right away if:  Your symptoms are severe. This information is not intended to replace advice given to you by your health care provider. Make sure you discuss any questions you have with your health care provider. Document Released: 07/10/2005 Document Revised: 03/21/2016 Document Reviewed:  05/30/2014 Elsevier Interactive Patient Education  2018 [employer].

## 2017-05-29 NOTE — Addendum Note (Signed)
Addended by: Neysa Hotter on: 05/29/2017 04:58 PM   Modules accepted: Orders

## 2017-06-04 ENCOUNTER — Ambulatory Visit: Payer: Medicare Other

## 2017-06-04 ENCOUNTER — Ambulatory Visit (HOSPITAL_BASED_OUTPATIENT_CLINIC_OR_DEPARTMENT_OTHER): Payer: Medicare Other

## 2017-06-04 ENCOUNTER — Other Ambulatory Visit (HOSPITAL_BASED_OUTPATIENT_CLINIC_OR_DEPARTMENT_OTHER): Payer: Medicare Other

## 2017-06-04 ENCOUNTER — Encounter: Payer: Self-pay | Admitting: Hematology

## 2017-06-04 ENCOUNTER — Other Ambulatory Visit: Payer: Self-pay | Admitting: *Deleted

## 2017-06-04 ENCOUNTER — Ambulatory Visit (HOSPITAL_COMMUNITY)
Admission: RE | Admit: 2017-06-04 | Discharge: 2017-06-04 | Disposition: A | Discharge status: Home / Self Care | Payer: Medicare Other | Source: Ambulatory Visit | Attending: Hematology | Admitting: Hematology

## 2017-06-04 ENCOUNTER — Ambulatory Visit (HOSPITAL_BASED_OUTPATIENT_CLINIC_OR_DEPARTMENT_OTHER): Payer: Medicare Other | Admitting: Hematology

## 2017-06-04 VITALS — BP 125/74 | HR 83 | Temp 98.8°F | Resp 18 | Ht 65.0 in | Wt 133.6 lb

## 2017-06-04 DIAGNOSIS — D509 Iron deficiency anemia, unspecified: Secondary | ICD-10-CM

## 2017-06-04 DIAGNOSIS — C7800 Secondary malignant neoplasm of unspecified lung: Secondary | ICD-10-CM | POA: Diagnosis not present

## 2017-06-04 DIAGNOSIS — C787 Secondary malignant neoplasm of liver and intrahepatic bile duct: Secondary | ICD-10-CM

## 2017-06-04 DIAGNOSIS — D500 Iron deficiency anemia secondary to blood loss (chronic): Secondary | ICD-10-CM | POA: Diagnosis not present

## 2017-06-04 DIAGNOSIS — Z95828 Presence of other vascular implants and grafts: Secondary | ICD-10-CM

## 2017-06-04 DIAGNOSIS — Z452 Encounter for adjustment and management of vascular access device: Secondary | ICD-10-CM

## 2017-06-04 DIAGNOSIS — E119 Type 2 diabetes mellitus without complications: Secondary | ICD-10-CM | POA: Diagnosis not present

## 2017-06-04 DIAGNOSIS — C187 Malignant neoplasm of sigmoid colon: Secondary | ICD-10-CM

## 2017-06-04 DIAGNOSIS — D6481 Anemia due to antineoplastic chemotherapy: Secondary | ICD-10-CM | POA: Diagnosis not present

## 2017-06-04 DIAGNOSIS — I5042 Chronic combined systolic (congestive) and diastolic (congestive) heart failure: Secondary | ICD-10-CM

## 2017-06-04 DIAGNOSIS — I10 Essential (primary) hypertension: Secondary | ICD-10-CM

## 2017-06-04 DIAGNOSIS — C7972 Secondary malignant neoplasm of left adrenal gland: Secondary | ICD-10-CM

## 2017-06-04 LAB — CBC WITH DIFFERENTIAL/PLATELET
BASO%: 0.4 % (ref 0.0–2.0)
Basophils Absolute: 0 10*3/uL (ref 0.0–0.1)
EOS ABS: 0.1 10*3/uL (ref 0.0–0.5)
EOS%: 2.6 % (ref 0.0–7.0)
HCT: 23.8 % — ABNORMAL LOW (ref 38.4–49.9)
HGB: 7.9 g/dL — ABNORMAL LOW (ref 13.0–17.1)
LYMPH%: 11.3 % — ABNORMAL LOW (ref 14.0–49.0)
MCH: 30.3 pg (ref 27.2–33.4)
MCHC: 33.2 g/dL (ref 32.0–36.0)
MCV: 91.4 fL (ref 79.3–98.0)
MONO#: 0.5 10*3/uL (ref 0.1–0.9)
MONO%: 11.7 % (ref 0.0–14.0)
NEUT#: 3.1 10*3/uL (ref 1.5–6.5)
NEUT%: 74 % (ref 39.0–75.0)
PLATELETS: 117 10*3/uL — AB (ref 140–400)
RBC: 2.61 10*6/uL — ABNORMAL LOW (ref 4.20–5.82)
RDW: 17 % — ABNORMAL HIGH (ref 11.0–14.6)
WBC: 4.2 10*3/uL (ref 4.0–10.3)
lymph#: 0.5 10*3/uL — ABNORMAL LOW (ref 0.9–3.3)

## 2017-06-04 LAB — MAGNESIUM: MAGNESIUM: 1.4 mg/dL — AB (ref 1.5–2.5)

## 2017-06-04 LAB — PREPARE RBC (CROSSMATCH)

## 2017-06-04 LAB — CEA (IN HOUSE-CHCC): CEA (CHCC-In House): 9032.69 ng/mL — ABNORMAL HIGH (ref 0.00–5.00)

## 2017-06-04 MED ORDER — DIPHENHYDRAMINE HCL 25 MG PO CAPS: 25.0000 mg | ORAL_CAPSULE | Freq: Once | ORAL | Status: AC

## 2017-06-04 MED ORDER — SODIUM CHLORIDE 0.9 % IV SOLN
Freq: Once | INTRAVENOUS | Status: AC
  Administered 2017-06-04: 12:00:00 via INTRAVENOUS
  Filled 2017-06-04: qty 250

## 2017-06-04 MED ORDER — ACETAMINOPHEN 325 MG PO TABS: 650.0000 mg | ORAL_TABLET | Freq: Once | ORAL | Status: AC

## 2017-06-04 MED ORDER — SODIUM CHLORIDE 0.9 % IJ SOLN
10.0000 mL | INTRAMUSCULAR | Status: DC | PRN
  Administered 2017-06-04: 10 mL via INTRAVENOUS
  Filled 2017-06-04: qty 10

## 2017-06-04 MED ORDER — HEPARIN SOD (PORK) LOCK FLUSH 100 UNIT/ML IV SOLN
500.0000 [IU] | Freq: Once | INTRAVENOUS | Status: DC | PRN
  Filled 2017-06-04: qty 5

## 2017-06-04 MED ORDER — SODIUM CHLORIDE 0.9 % IJ SOLN
10.0000 mL | INTRAMUSCULAR | Status: DC | PRN
  Filled 2017-06-04: qty 10

## 2017-06-04 MED ORDER — SODIUM CHLORIDE 0.9 % IV SOLN: 250.0000 mL | Freq: Once | INTRAVENOUS | Status: AC

## 2017-06-04 NOTE — Patient Instructions (Signed)
Magnesium Sulfate injection What is this medicine? MAGNESIUM SULFATE (mag NEE zee um SUL fate) is an electrolyte injection commonly used to treat low magnesium levels in your blood. It is also used to prevent or control seizures in women with preeclampsia or eclampsia. This medicine may be used for other purposes; ask your health care provider or pharmacist if you have questions. What should I tell my health care provider before I take this medicine? They need to know if you have any of these conditions: -heart disease -history of irregular heart beat -kidney disease -an unusual or allergic reaction to magnesium sulfate, medicines, foods, dyes, or preservatives -pregnant or trying to get pregnant -breast-feeding How should I use this medicine? This medicine is for infusion into a vein. It is given by a health care professional in a hospital or clinic setting. Talk to your pediatrician regarding the use of this medicine in children. While this drug may be prescribed for selected conditions, precautions do apply. Overdosage: If you think you have taken too much of this medicine contact a poison control center or emergency room at once. NOTE: This medicine is only for you. Do not share this medicine with others. What if I miss a dose? This does not apply. What may interact with this medicine? This medicine may interact with the following medications: -certain medicines for anxiety or sleep -certain medicines for seizures like phenobarbital -digoxin -medicines that relax muscles for surgery -narcotic medicines for pain This list may not describe all possible interactions. Give your health care provider a list of all the medicines, herbs, non-prescription drugs, or dietary supplements you use. Also tell them if you smoke, drink alcohol, or use illegal drugs. Some items may interact with your medicine. What should I watch for while using this medicine? Your condition will be monitored carefully  while you are receiving this medicine. You may need blood work done while you are receiving this medicine. What side effects may I notice from receiving this medicine? Side effects that you should report to your doctor or health care professional as soon as possible: -allergic reactions like skin rash, itching or hives, swelling of the face, lips, or tongue -facial flushing -muscle weakness -signs and symptoms of low blood pressure like dizziness; feeling faint or lightheaded, falls; unusually weak or tired -signs and symptoms of a dangerous change in heartbeat or heart rhythm like chest pain; dizziness; fast or irregular heartbeat; palpitations; breathing problems -sweating This list may not describe all possible side effects. Call your doctor for medical advice about side effects. You may report side effects to FDA at 1-800-FDA-1088. Where should I keep my medicine? This drug is given in a hospital or clinic and will not be stored at home. NOTE: This sheet is a summary. It may not cover all possible information. If you have questions about this medicine, talk to your doctor, pharmacist, or health care provider.  2018 Elsevier/Gold Standard (2016-05-01 12:31:42)    Hypomagnesemia Hypomagnesemia is a condition in which the level of magnesium in the blood is low. Magnesium is a mineral that is found in many foods. It is used in many different processes in the body. Hypomagnesemia can affect every organ in the body. It can cause life-threatening problems. What are the causes? Causes of hypomagnesemia include:  Not getting enough magnesium in your diet.  Malnutrition.  Problems with absorbing magnesium from the intestines.  Dehydration.  Alcohol abuse.  Vomiting.  Severe diarrhea.  Some medicines, including medicines that make you urinate  more.  Certain diseases, such as kidney disease, diabetes, and overactive thyroid.  What are the signs or symptoms?  Involuntary shaking or  trembling of a body part (tremor).  Confusion.  Muscle weakness.  Sensitivity to light, sound, and touch.  Psychiatric issues, such as depression, irritability, or psychosis.  Sudden tightening of muscles (muscle spasms).  Tingling in the arms and legs.  A feeling of fluttering of the heart. These symptoms are more severe if magnesium levels drop suddenly. How is this diagnosed? To make a diagnosis, your health care provider will do a physical exam and order blood and urine tests. How is this treated? Treatment will depend on the cause and the severity of your condition. It may involve:  A magnesium supplement. This can be taken in pill form. It can also be given through an IV tube. This is usually done if the condition is severe.  Changes to your diet. You may be directed to eat foods that have a lot of magnesium, such as green leafy vegetables, peas, beans, and nuts.  Eliminating alcohol from your diet.  Follow these instructions at home:  Include foods with magnesium in your diet. Foods that are rich in magnesium include green vegetables, beans, nuts and seeds, and whole grains.  Take medicines only as directed by your health care provider.  Take magnesium supplements if your health care provider instructs you to do that. Take them as directed.  Have your magnesium levels monitored as directed by your health care provider.  When you are active, drink fluids that contain electrolytes.  Keep all follow-up visits as directed by your health care provider. This is important. Contact a health care provider if:  You get worse instead of better.  Your symptoms return. Get help right away if:  Your symptoms are severe. This information is not intended to replace advice given to you by your health care provider. Make sure you discuss any questions you have with your health care provider. Document Released: 07/10/2005 Document Revised: 03/21/2016 Document Reviewed:  05/30/2014 Elsevier Interactive Patient Education  2018 [employer].

## 2017-06-04 NOTE — Progress Notes (Signed)
[place]  [phone]) [phone] [phone]) [phone]  Clinic follow up Note   Patient Care Team: [name], MD as PCP - General (Specialist) [name], CRNP as Nurse Practitioner (Nurse Practitioner) Truitt Merle, MD as Consulting Physician (Hematology) Ladene Artist, MD as Consulting Physician (Gastroenterology) 06/04/2017  CHIEF COMPLAINTS Follow up metastatic sigmoid colon cancer  Oncology History   He is a Metastatic colon cancer to liver   Staging form: Colon and Rectum, AJCC 7th Edition     Clinical: Stage Unknown ([place], NX, M1) - Unsigned      Cancer of sigmoid colon ([place])   10/28/2014 Tumor Marker    AFP 3.2 CEA > 10,000 CA 19-9 12,929.6. tumor (-) KRAS and NRAS mutation.       11/10/2014 Imaging    PET scan showed hypermetabolic mass in the sigmoid colon was noted metastasis in the retroperitoneum. Probable left adrenal and pulmonary metastasis, and diffuse liver metastasis.      11/21/2014 Initial Diagnosis    Metastatic colon cancer to liver, lung, abd nodes and left adrenal gland. Diagnosis was made by liver biopsy.       11/30/2014 - 05/23/2015 Chemotherapy    First line chemo [id number], Panitumumab added from second cycle, chemo held due to recurrent GI bleeding        12/28/2014 - 12/31/2014 Hospital Admission    Was admitted for dehydration, neutropenia fever with UTI, and severe skin rash.      02/08/2015 - 02/12/2015 Hospital Admission    He was admitted for upper GI bleeding, e.g. showed a gastric ulcer with clots, status post appendectomy injection. He also received a blood transfusion.      03/01/2015 Tumor Marker    CEA 694, CA19.9 462      03/13/2015 Imaging    CT CAP showed partial response, no new lesions.       05/31/2015 - 06/02/2015 Hospital Admission    He was admitted to [facility] in [place] due to upper GI bleeding, EGD showed gastric and dudenal ulcers       06/27/2015 - 06/29/2015 Hospital Admission    he was  admitted for dairrhea and pancolitis, c-diff and stool cultures were negative, treated with antibiotics      07/12/2015 - 05/14/2017 Chemotherapy    panitumumab 37m/kg, every 2 weeks      09/19/2015 - 05/14/2017 Chemotherapy    Irinotecan 1861mm2 every 2 weeks, dose reduction due to diarrhea. 5-Fu added on 06/10/2016 due to slight disease progression.   Due to his prolonged chemotherapy, I'll reduce his chemotherapy dose.  He will take Xeloda 15005mwice a day for 2 weeks, with the third week off. The patient will continue with oxali infusions every 3 weeks; Dose reduction to 39m76m for this cycles. Started 02/17/17.  Due to deterioration, hold CAPOX for now; continue vectibix today and every 2 weeks. -05/02/17      11/08/2015 - 11/10/2015 Hospital Admission    Pt was admitted for sepsis and hypotension, was found to have (+) influenza A and treated.       11/30/2015 Imaging     CT scans reviewed continued improvement in the liver metastasis, no other new lesions.      05/06/2016 Tumor Marker    CEA has gradually increased from 110 in March 2017 to 566 in July 2017      06/04/2016 Imaging    Restaging CT scan showed similar to mild increase in hepatic metastasis, mild enlargement of right adrenal  nodule, primary similar pulmonary nodules. A left upper lobe nodule has resolved.      11/12/2016 Imaging    CT CAP - Restaging IMPRESSION: 1. No significant change in the appearance of multifocal pulmonary nodules. 2. Previously referenced partially calcified liver metastases are stable from previous exam. Within the posterior right lobe of liver there is a metastasis which appears slightly increased in size from previous exam. 3. Mild decrease in size of right adrenal nodule. 4. There is mild wall thickening and inflammation involving the descending colon and proximal sigmoid colon worrisome for colitis. A small intramural fluid collection is identified which is unchanged from 08/19/2016,  significance unknown. 5. Aortic atherosclerosis.      02/03/2017 Imaging    CT CAP w Contrast Chest Impression: 1. Interval increase in size of pulmonary nodules as well as several new pulmonary nodules consistent with progression of pulmonary metastasis.  Abdomen / Pelvis Impression: 1. Interval increase in size of hepatic metastasis within LEFT and RIGHT hepatic lobe. 2. 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 Enhanced Submucosal collections in the proximal sigmoid colon are concerning for colorectal carcinoma recurrence. Resolution of descending colitis.      05/07/2017 Imaging    CT CAP W Contrast 05/07/17 IMPRESSION: Chest Impression: 1. Bilateral small pulmonary are increased in size by 2 mm. Findings consistent with tumor progression. 2. No lymphadenopathy. Abdomen / Pelvis Impression: 1. Interval increase in size of enhancing lesion in the anterior RIGHT hepatic lobe. Other hepatic lesions appear stable. 2. Decrease in size of splenic lesion. 3. Stable RIGHT adrenal metastasis. 4. Submucosal Cystic lesion the descending colon is stable. 5. No new metastatic disease in the abdomen pelvis. 6. Recanalization umbilical vein.      05/08/2017 Imaging    MRI Brain 05/08/17 IMPRESSION: No evidence of metastatic disease. Background pattern of extensive chronic small-vessel ischemic changes affecting the cerebral hemispheres. 1 cm acute infarction in the radiating white matter tracts on the right, presumably incidentally detected.       HISTORY OF PRESENTING ILLNESS:  Jason Reilly 71 y.o. male is here because of abnormal CT findings, which is very suspicious for malignancy. He is on ranitidine from [place], has been on in the [place] for 16 years. He came in with his son and an interpreter.  He has been feeling fatigued since two month ago. He is still able to do all ADLs. He otherwise denies any pain, bloating or nausea.  He lost about 20lbs in 3 month. His  appetite is lower than before, eats less, no change of his bowl habits.  She denied any hematochezia or melana. Per his son, he has had some personality changes daily, irritable, slightly confused some time.  He was evaluated by his primary care physician. Lab test reviewed hepatitis B infection, which he did not know before, and elevated alkaline phosphatase, his liver function and the rest of the liver function was not remarkable. [place] of abdomen was obtained on 07/22/2014, which showed diffusely abnormal liver with multiple echogenic lesions. CT of abdomen with and without contrast was done on 08/26/2014, which reviewed here at a medically with multiple large partially calcified hepatic masses consistent with metastatic disease. Mild retroperitoneal adenopathy with the largest node measuring 1.6 cm. And nonspecific 1.4 cm left adrenal nodule was also noticed. His tumor marker showed CEA greater than 10,000, CA 19-9 12,929, AFP 3.2 (normal). He was referred to [place] [facility] liver clinic and was evaluated by nurse practitioner [name]. Treatment for hepatitis  B was not recommended based on his virus load.  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 He was evaluated by a cardiologist in 2014. He denies any significant dyspnea on exertion. No leg swollen.  CURRENT THERAPY:  Supportive Care   INTERIM HISTORY:  Raylen returns for follow-up. He presents to the clinic today his son and Optometrist. He reports to eating better and being good overall. His stool is good. He has not noticed any bleeding.  He denies feelgin tired. His son said the rash is now gone.     MEDICAL HISTORY:  Past Medical History:  Diagnosis Date  . Abnormal EKG   . Diabetes mellitus without complication (HCC)    metformin  . Gastric ulcer   . H. pylori infection   . Hepatitis B   . Hypertension   . Metastatic colon cancer to liver ([place])   . Non-ischemic cardiomyopathy  ([place])   . Tachycardia     SURGICAL HISTORY: Past Surgical History:  Procedure Laterality Date  . ESOPHAGOGASTRODUODENOSCOPY N/A 02/08/2015   Procedure: ESOPHAGOGASTRODUODENOSCOPY (EGD);  Surgeon: Ladene Artist, MD;  Location: [facility];  Service: Endoscopy;  Laterality: N/A;  . ESOPHAGOGASTRODUODENOSCOPY (EGD) WITH PROPOFOL N/A 08/15/2015   Procedure: ESOPHAGOGASTRODUODENOSCOPY (EGD) WITH PROPOFOL;  Surgeon: Ladene Artist, MD;  Location: [facility];  Service: Endoscopy;  Laterality: N/A;  . LEFT AND RIGHT HEART CATHETERIZATION WITH CORONARY ANGIOGRAM N/A 03/29/2013   Procedure: LEFT AND RIGHT HEART CATHETERIZATION WITH CORONARY ANGIOGRAM;  Surgeon: Pixie Casino, MD;  Location: [facility] CATH LAB;  Service: Cardiovascular;  Laterality: N/A;  . Nuclear Stress Test  03/03/2013   High risk - consistent with nonischemic cardiomyopathy    SOCIAL HISTORY: Social History   Social History  . Marital status: Married    Spouse name: N/A  . Number of children: N/A  . Years of education: N/A   Occupational History  . Not on file.   Social History Main Topics  . Smoking status: Former Smoker    Types: Cigarettes    Quit date: 11/07/2008  . Smokeless tobacco: Never Used  . Alcohol use Yes     Comment: occasional  . Drug use: No  . Sexual activity: No   Other Topics Concern  . Not on file   Social History Narrative   Married   Enjoys walking   Has lived in [place] > 16 years    FAMILY HISTORY: No family history of liver disease or malignancy.  ALLERGIES:  has No Known Allergies.  MEDICATIONS:  Current Outpatient Prescriptions on File Prior to Visit  Medication Sig Dispense Refill  . clindamycin (CLINDAGEL) 1 % gel Apply topically 2 (two) times daily. 30 g 2  . diphenoxylate-atropine (LOMOTIL) 2.5-0.025 MG tablet Take 1-2 tablets by mouth 4 (four) times daily as needed for diarrhea or loose stools. 30 tablet 0  . folic acid (FOLVITE) 1 MG tablet Take 1 tablet (1 mg total) by mouth  daily. 30 tablet 3  . hydrocortisone 1 % ointment Apply 1 application topically 2 (two) times daily. 56 g 0  . lidocaine-prilocaine (EMLA) cream Apply 1 application topically as needed. Apply to [facility] cath at least one and a half hour to two hours  before needle stick as needed. 30 g 2  . lisinopril-hydrochlorothiazide (PRINZIDE,ZESTORETIC) 10-12.5 MG tablet     . loperamide (IMODIUM) 2 MG capsule Take 2 capsules (4 mg total) by mouth 4 (four) times daily as needed for diarrhea or loose  stools (NO MORE THAN 8 TABLETS PER DAY.). 60 capsule 3  . magic mouthwash SOLN Take 5 mLs by mouth 4 (four) times daily. 120 mL 1  . magnesium oxide (MAG-OX) 400 (241.3 Mg) MG tablet Take 1 tablet (400 mg total) by mouth 3 (three) times daily. 90 tablet 3  . metFORMIN (GLUCOPHAGE) 500 MG tablet Take 1 tablet (500 mg total) by mouth 2 (two) times daily with a meal. 60 tablet 0  . nadolol (CORGARD) 20 MG tablet Take 1 tablet (20 mg total) by mouth 2 (two) times daily. THIS IS A ONE TIME ORDER.  FUTURE REFILLS NEED TO BE DONE BY PRIMARY MD. 60 tablet 1  . ondansetron (ZOFRAN) 8 MG tablet Take 1 tablet (8 mg total) by mouth every 8 (eight) hours as needed for nausea or vomiting. 45 tablet 2  . pantoprazole (PROTONIX) 40 MG tablet Take 1 tablet (40 mg total) by mouth daily. 30 tablet 3  . potassium chloride SA (K-DUR,KLOR-CON) 20 MEQ tablet Take 1 tablet (20 mEq total) by mouth daily. 14 tablet 0  . prochlorperazine (COMPAZINE) 10 MG tablet TAKE 1 TABLET BY MOUTH ONCE DAILY EVERY 6 HOURS AS NEEDED FOR NAUSEA AND VOMITING 30 tablet 3   Current Facility-Administered Medications on File Prior to Visit  Medication Dose Route Frequency Provider Last Rate Last Dose  . 0.9 %  sodium chloride infusion   Intravenous Once Truitt Merle, MD      . ferumoxytol [facility]) 510 mg in sodium chloride 0.9 % 100 mL IVPB  510 mg Intravenous Once Truitt Merle, MD      . heparin lock flush 100 unit/mL  500 Units Intravenous Once PRN Truitt Merle, MD       . sodium chloride 0.9 % injection 10 mL  10 mL Intracatheter PRN Truitt Merle, MD   10 mL at 10/03/15 1705  . sodium chloride 0.9 % injection 10 mL  10 mL Intravenous PRN Truitt Merle, MD   10 mL at 05/14/16 1127  . sodium chloride 0.9 % injection 10 mL  10 mL Intravenous PRN Truitt Merle, MD       REVIEW OF SYSTEMS:   Constitutional: Denies fevers, chills or abnormal night sweats (+) increased weight loss (+) increased fatigue (+) loss of appetite (+) memory loss Eyes: Denies blurriness of vision, double vision or watery eyes Ears, nose, mouth, throat, and face: Denies mucositis, sore throat, or dry mouth (+) drools unknowingly Respiratory: Denies cough, dyspnea or wheezes Cardiovascular: Denies palpitation, chest discomfort or lower extremity swelling Gastrointestinal: Denies nausea, heartburn or change in bladder/ bowel habits Skin: (+) itching at port site  Lymphatics: Denies new lymphadenopathy or easy bruising Neurological:Denies numbness, tingling or new weaknesses Behavioral/Psych: Mood is stable, no new changes  All other systems were reviewed with the patient and are negative.  PHYSICAL EXAMINATION: ECOG PERFORMANCE STATUS: 3  Vitals:   06/04/17 1056  BP: 125/74  Pulse: 83  Resp: 18  Temp: 98.8 F (37.1 C)  TempSrc: Oral  SpO2: 100%  Weight: 133 lb 9.6 oz (60.6 kg)  Height: '5\' 5"'  (1.651 m)    GENERAL:alert, no distress and comfortable (+) appears slightly lost, (+) saliva drooping on his mouth SKIN: (+) Dry skin,  no skin ulcer, (+) previous scatter skin rashes on his neck and upper chest are most resolved  EYES: normal, conjunctiva are pink and non-injected, sclera clear OROPHARYNX:no exudate, no erythema and lips, buccal mucosa, and tongue with mild discoloration at the tip. The low  front teeth are missing  NECK: supple, thyroid normal size, non-tender, without nodularity LYMPH:  no palpable lymphadenopathy in the cervical, axillary or inguinal LUNGS: clear to  auscultation and percussion with normal breathing effort, no rales  HEART: regular rate & rhythm and no murmurs and no lower extremity edema ABDOMEN:abdomen soft, non-tender, no hepatomegaly, no splenomegaly and normal bowel sounds Musculoskeletal:no cyanosis of digits and no clubbing  PSYCH: alert & oriented x 3 with fluent speech NEURO: no focal motor/sensory deficits, but his speech and response is slow.  LABORATORY DATA:  I have reviewed the data as listed CBC Latest Ref Rng & Units 06/04/2017 05/28/2017 05/14/2017  WBC 4.0 - 10.3 10e3/uL 4.2 5.0 5.6  Hemoglobin 13.0 - 17.1 g/dL 7.9(L) 8.7(L) 10.5(L)  Hematocrit 38.4 - 49.9 % 23.8(L) 26.2(L) 31.8(L)  Platelets 140 - 400 10e3/uL 117(L) 92(L) 121(L)   CMP Latest Ref Rng & Units 05/28/2017 05/14/2017 05/02/2017  Glucose 70 - 140 mg/dl 124 122 121  BUN 7.0 - 26.0 mg/dL 15.2 14.3 12.4  Creatinine 0.7 - 1.3 mg/dL 1.0 0.9 0.8  Sodium 136 - 145 mEq/L 137 141 138  Potassium 3.5 - 5.1 mEq/L 4.1 4.4 3.9  Chloride 101 - 111 mmol/L - - -  CO2 22 - 29 mEq/L '23 25 26  ' Calcium 8.4 - 10.4 mg/dL 9.1 8.8 8.6  Total Protein 6.4 - 8.3 g/dL 8.2 8.4(H) 8.3  Total Bilirubin 0.20 - 1.20 mg/dL 1.06 0.77 0.85  Alkaline Phos 40 - 150 U/L 184(H) 177(H) 129  AST 5 - 34 U/L 43(H) 52(H) 50(H)  ALT 0 - 55 U/L '17 23 19   ' INITIAL tumor markers AFP 3.2 CEA > 10,000 CA 19-9 12,929.6   CEA  01/11/2015: 1809 07/05/2015: 386 10/03/2015: 66.4 02/19/2016: 155.8 05/06/2016: 566 07/08/2016: 834 08/05/2016: 1102 10/07/2016: 1733 11/06/2016: 2214.95 12/16/2016: 2858.90 01/06/2017: 3495 02/03/17: 7247 03/12/17: 5424.61 04/02/17: 3592.19 05/02/17: 4197.89 06/04/17: PENDING    PATHOLOGY Liver, needle/core biopsy 11/21/2014 - METASTATIC ADENOCARCINOMA, SEE COMMENT. Microscopic Comment The adenocarcinoma demonstrates the following immunophenotype: Cytokeratin 7 - negative expression. Cytokeratin 20 - strong diffuse expression. CD2 - strong diffuse expression. Overall the  morphology and immunophenotype are that of metastatic adenocarcinoma primary to colorectum. The recent nuclear medicine scan demonstrating sigmoid mass with associated liver masses is noted.  [facility] test result:    RADIOGRAPHIC STUDIES: I have personally reviewed the outside CT scan image with patient and his son.   MRI Brain 05/08/17 IMPRESSION: No evidence of metastatic disease. Background pattern of extensive chronic small-vessel ischemic changes affecting the cerebral hemispheres. 1 cm acute infarction in the radiating white matter tracts on the right, presumably incidentally detected.   CT CAP W Contrast 05/07/17 IMPRESSION: Chest Impression: 1. Bilateral small pulmonary are increased in size by 2 mm. Findings consistent with tumor progression. 2. No lymphadenopathy. Abdomen / Pelvis Impression: 1. Interval increase in size of enhancing lesion in the anterior RIGHT hepatic lobe. Other hepatic lesions appear stable. 2. Decrease in size of splenic lesion. 3. Stable RIGHT adrenal metastasis. 4. Submucosal Cystic lesion the descending colon is stable. 5. No new metastatic disease in the abdomen pelvis. 6. Recanalization umbilical vein.   CT CAP w Contrast 02/03/2017 IMPRESSION: Chest Impression: 1. Interval increase in size of pulmonary nodules as well as several new pulmonary nodules consistent with progression of pulmonary metastasis. Abdomen / Pelvis Impression: 1. Interval increase in size of hepatic metastasis within LEFT and RIGHT hepatic lobe. 2. 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 Enhanced Submucosal  collections in the proximal sigmoid colon are concerning for colorectal carcinoma recurrence. Resolution of descending colitis.   ASSESSMENT & PLAN:  71 y.o. [place] male, with past history of hypertension and dilated nonischemic gammopathy with EF 25%, no clinical signs of heart failure, who was found to have hepatitis B infection  lately, and multiple liver lesions on the CT scan. He has extremely high CEA and CA 19-9 levels. PET scan reviewed a hypermetabolic sigmoid colon mass, diffuse liver metastasis, probable lung and adrenal gland metastasis.  1. Metastatic sigmoid colon cancer, with diffuse liver, lungs, node and left adrenal gland metastases. KRAS/NRAS wild type, MSI-stable -Previous Liver biopsy showed metastatic adenocarcinoma. His tumor were strongly positive for CK20 and CD2, consistent with primary colorectal primary. KRAS and NRAS mutations were not detected.  -Pt understands that this is incurable cancer, and he has very high disease burden and overall prognosis is poor. The treatment goal is palliative -I previously discussed his restaging CT from 08/19/2016 which showed overall stable disease in liver, a few small lung nodules, slightly bigger, will continue monitoring. - his previous scan showed overall some improvement since we restarted on 5-FU in August 2017. -His tumor marker CEA has been trending up, concerning for slow disease progression, we'll continue monitoring -I previously reviewed the CT scan from 02/03/2017 with the patient in detail, images reviewed in person. Unfortunately he has had significant disease progression in the liver -He has had FOLFOX and FOLFIRI, and CAPOX, plus panitumumab  -We reviewed his recent MRI brian scan that shows no evidence of metastasis. I believe his confusion is related to chemo brain or possible underline dementia.  -we also reviewed his restating CT scan from 05/07/2017 which showed evidence of cancer progression in the liver, and multiple small lung nodules slightly increased in size. Due to the cancer progression, and overall deteriorated performance status recently, I recommend him to stop CAPOX and panitumumab  -We discussed the options for third and fourth line treatment, which is limited, and response rate in the benefit are likely to be small.  -Due to his  deteriorating of his performance status, palliative care and hospice is also reasonable at this stage, I encouraged patient and his family to consider. -After lengthy discussion, patient and his son decided take chemo break for now, and see how things go. He decided not to start [place] for now.  -continue supportive care \.  -Labs reviewed and his Mg has improved so I will give last Mg infusion today. - His Hg is 7.9 so I will schedule Blood transfusion for this week or next week. -Will f/u in 3 weeks  2. Grade 1-2 skin rashes  -Secondary to panitumumab, stable overall  -Rash now resolved after he stopped panitumumab    3. Gastric ulcer with significant GI bleeding in April and Aug 2016 -He is on PPI, continue once daily  -Repeat his EGD on 08/15/2015 showed near complete healing of his gastric ulcer -continue Nadolol 69m bid, per Dr. SFuller Plan- I previously encourage him to follow-up with Dr. SFuller [facility]   4. Type 2 DM  -His glucose level has increased lately, it was 267 on 12/02/16. I encouraged him to watch his diet, avoid any sweets, and monitor closely at home -I previously encouraged him to follow-up with his primary care physician Dr. WJimmye Norman He is in the process to change his primary care physician. -I previously recommended him to increase metformin to 1000 mg twice daily, to better control his  hyperglycemia.  5. HTN, Dilated nonischemic ischemia cardiomyopathy with EF 25% -He is clinically doing well without symptoms of CHF. However this is probably going to impact his chemotherapy.Will try to avoid cardiotoxic chemotherapy agent and avoid fluid overload during chemotherapy. -Continue follow-up with cardiology. -will hold on his lisinopril-HCTZ for now, his BP has been normal previously   6 Hepatitis B carrier, with mild portal hypertension  -Per liver clinic, no need for treatment. Follow-up with Dr. Silvio Pate   7. Malnutrition -I previously encouraged him  to eat more, and take supplements as needed. -improved, gained weight back after I reduce his chemotherapy dose  -follow up with Dietitian  -He does not want to eat. I discussed with his son to increase ensure to 2 a day and trying glucerna.  -I previously encouraged him to eat what he would like liberally as long as his side effects can tolerate it.   8. Anemia secondary to GI bleeding, iron deficiency and chemo  -Repeat lab on 06/06/2015 showed ferritin 117, serum iron 24, saturation 8%, which supports iron deficiency -He received IV Feraheme again in Aug 2016 after GI bleeding  -Repeat a ferritin was 273 on 10/03/2015, much improved  -he received iv feraheme again on 11/27/2015 and 12/04/2015, but anemia did not improve much. -His anemia has been slightly worse lately, probably related to chemotherapy, we'll close monitor any signs of GI bleeding. -Blood transfusion if hemoglobin less than 8 or symptomatic anemia with hemoglobin 8-9 -We previously discussed that the patient does not need a refill on his Folic Acid since he is not drinking alcohol anymore.  9. hypomagnesemia  -secondary to panitumumab -he receives IV mag 6 g every week  -He is taking magnesium pill 2 tablets 3 times a day -continue monitoring  -Stopped panitumumab and will monitor recovery  -Last Magnesium infusion today (06/04/17) since his counts are much improved.   10. Goal of care discussion, DNR/DNI -We again discussed the incurable nature of his cancer, and the overall poor prognosis, especially if he does not have good response to chemotherapy or progress on chemo -The patient understands the goal of care is palliative. -I again recommended DNR/DNI, he finally agreed today. -I suggest that if he is not able to make the decision himself he can appoint a POA to make the decision.  -I will refer to social worker for help with paperwork.   11. memory loss and confusion  -He has had more memory loss lately, his wife  manage his medications at home. -He denied headaches or other neurological symptoms. -Chemo brain versus underlying dementia, rule out brain mets  -I ordered a brain MRI to ruled out brain metastasis, given his worsening memory loss and confusion -With 05/08/17 Brain MRI, Brain mets have been ruled out.    Plan -Blood transfusion 4hrs this week or next week with lab one day before -Contnue with last Magnesium infusion today  -Lab flush and f/u in 3 weeks  -continue supportive care alone    All questions were answered. The patient knows to call the clinic with any problems, questions or concerns.  I spent 25 minutes counseling the patient face to face. The total time spent in the appointment was 30 minutes and more than 50% was on counseling.  This document serves as a record of services personally performed by Truitt Merle, MD. It was created on her behalf by Joslyn Devon, a trained medical scribe. The creation of this record is based on the scribe's personal observations  and the provider's statements to them. This document has been checked and approved by the attending provider.   I have reviewed the above documentation for accuracy and completeness and I agree with the above.   Truitt Merle, MD 06/04/2017

## 2017-06-04 NOTE — Addendum Note (Signed)
Addended by: Truitt Merle on: 06/04/2017 07:02 PM   Modules accepted: Orders

## 2017-06-05 ENCOUNTER — Other Ambulatory Visit: Payer: Self-pay | Admitting: *Deleted

## 2017-06-05 DIAGNOSIS — C187 Malignant neoplasm of sigmoid colon: Secondary | ICD-10-CM

## 2017-06-05 NOTE — Progress Notes (Signed)
Patient flushed with saline and heparin. Port a cath [employer]. Discharge instructions provided. Patient voiced understanding.

## 2017-06-07 ENCOUNTER — Ambulatory Visit: Payer: Medicare Other

## 2017-06-07 DIAGNOSIS — C187 Malignant neoplasm of sigmoid colon: Secondary | ICD-10-CM

## 2017-06-07 DIAGNOSIS — D500 Iron deficiency anemia secondary to blood loss (chronic): Secondary | ICD-10-CM | POA: Diagnosis not present

## 2017-06-07 LAB — PREPARE RBC (CROSSMATCH)

## 2017-06-07 MED ORDER — ACETAMINOPHEN 325 MG PO TABS
650.0000 mg | ORAL_TABLET | Freq: Once | ORAL | Status: AC
  Administered 2017-06-07: 650 mg via ORAL

## 2017-06-07 MED ORDER — DIPHENHYDRAMINE HCL 25 MG PO CAPS
ORAL_CAPSULE | ORAL | Status: AC
  Filled 2017-06-07: qty 1

## 2017-06-07 MED ORDER — ACETAMINOPHEN 325 MG PO TABS
ORAL_TABLET | ORAL | Status: AC
  Filled 2017-06-07: qty 2

## 2017-06-07 MED ORDER — HEPARIN SOD (PORK) LOCK FLUSH 100 UNIT/ML IV SOLN
500.0000 [IU] | Freq: Once | INTRAVENOUS | Status: AC
  Administered 2017-06-07: 500 [IU]
  Filled 2017-06-07: qty 5

## 2017-06-07 MED ORDER — SODIUM CHLORIDE 0.9% FLUSH
10.0000 mL | Freq: Once | INTRAVENOUS | Status: AC
  Administered 2017-06-07: 10 mL
  Filled 2017-06-07: qty 10

## 2017-06-07 MED ORDER — SODIUM CHLORIDE 0.9 % IV SOLN
250.0000 mL | Freq: Once | INTRAVENOUS | Status: AC
  Administered 2017-06-07: 250 mL via INTRAVENOUS

## 2017-06-07 MED ORDER — DIPHENHYDRAMINE HCL 25 MG PO CAPS
25.0000 mg | ORAL_CAPSULE | Freq: Once | ORAL | Status: AC
  Administered 2017-06-07: 25 mg via ORAL

## 2017-06-07 NOTE — Patient Instructions (Signed)

## 2017-06-09 LAB — BPAM RBC
BLOOD PRODUCT EXPIRATION DATE: 201808312359
Blood Product Expiration Date: 201808312359
Blood Product Expiration Date: 201809122359
Blood Product Expiration Date: 201809122359
ISSUE DATE / TIME: 201808110943
ISSUE DATE / TIME: 201808112345
ISSUE DATE / TIME: 201808120217
ISSUE DATE / TIME: 201808110943
UNIT TYPE AND RH: 5100
Unit Type and Rh: 5100
Unit Type and Rh: 5100
Unit Type and Rh: 5100

## 2017-06-09 LAB — TYPE AND SCREEN
ABO/RH(D): O POS
ANTIBODY SCREEN: NEGATIVE
UNIT DIVISION: 0
UNIT DIVISION: 0
Unit division: 0
Unit division: 0

## 2017-06-26 ENCOUNTER — Encounter: Payer: Self-pay | Admitting: Hematology

## 2017-06-26 ENCOUNTER — Telehealth: Payer: Self-pay | Admitting: Hematology

## 2017-06-26 ENCOUNTER — Ambulatory Visit (HOSPITAL_BASED_OUTPATIENT_CLINIC_OR_DEPARTMENT_OTHER): Payer: Medicare Other | Admitting: Hematology

## 2017-06-26 ENCOUNTER — Other Ambulatory Visit (HOSPITAL_BASED_OUTPATIENT_CLINIC_OR_DEPARTMENT_OTHER): Payer: Medicare Other

## 2017-06-26 VITALS — BP 137/55 | HR 66 | Temp 98.8°F | Resp 18 | Ht 65.0 in | Wt 135.3 lb

## 2017-06-26 DIAGNOSIS — C7972 Secondary malignant neoplasm of left adrenal gland: Secondary | ICD-10-CM

## 2017-06-26 DIAGNOSIS — C787 Secondary malignant neoplasm of liver and intrahepatic bile duct: Secondary | ICD-10-CM | POA: Diagnosis not present

## 2017-06-26 DIAGNOSIS — C187 Malignant neoplasm of sigmoid colon: Secondary | ICD-10-CM

## 2017-06-26 DIAGNOSIS — E119 Type 2 diabetes mellitus without complications: Secondary | ICD-10-CM

## 2017-06-26 DIAGNOSIS — D509 Iron deficiency anemia, unspecified: Secondary | ICD-10-CM

## 2017-06-26 DIAGNOSIS — I10 Essential (primary) hypertension: Secondary | ICD-10-CM

## 2017-06-26 DIAGNOSIS — C7800 Secondary malignant neoplasm of unspecified lung: Secondary | ICD-10-CM | POA: Diagnosis not present

## 2017-06-26 DIAGNOSIS — I5042 Chronic combined systolic (congestive) and diastolic (congestive) heart failure: Secondary | ICD-10-CM

## 2017-06-26 DIAGNOSIS — D6481 Anemia due to antineoplastic chemotherapy: Secondary | ICD-10-CM | POA: Diagnosis not present

## 2017-06-26 LAB — COMPREHENSIVE METABOLIC PANEL
ALBUMIN: 3.1 g/dL — AB (ref 3.5–5.0)
ALT: 30 U/L (ref 0–55)
ANION GAP: 5 meq/L (ref 3–11)
AST: 61 U/L — AB (ref 5–34)
Alkaline Phosphatase: 366 U/L — ABNORMAL HIGH (ref 40–150)
BUN: 15.2 mg/dL (ref 7.0–26.0)
CHLORIDE: 104 meq/L (ref 98–109)
CO2: 25 meq/L (ref 22–29)
CREATININE: 1.1 mg/dL (ref 0.7–1.3)
Calcium: 9.7 mg/dL (ref 8.4–10.4)
EGFR: 65 mL/min/{1.73_m2} — ABNORMAL LOW (ref >90)
Glucose: 206 mg/dl — ABNORMAL HIGH (ref 70–140)
POTASSIUM: 4.7 meq/L (ref 3.5–5.1)
Sodium: 135 mEq/L — ABNORMAL LOW (ref 136–145)
Total Bilirubin: 1.31 mg/dL — ABNORMAL HIGH (ref 0.20–1.20)
Total Protein: 8.8 g/dL — ABNORMAL HIGH (ref 6.4–8.3)

## 2017-06-26 LAB — CBC WITH DIFFERENTIAL/PLATELET
BASO%: 0.2 % (ref 0.0–2.0)
Basophils Absolute: 0 10*3/uL (ref 0.0–0.1)
EOS%: 1.7 % (ref 0.0–7.0)
Eosinophils Absolute: 0.1 10*3/uL (ref 0.0–0.5)
HEMATOCRIT: 29.8 % — AB (ref 38.4–49.9)
HEMOGLOBIN: 9.8 g/dL — AB (ref 13.0–17.1)
LYMPH#: 0.9 10*3/uL (ref 0.9–3.3)
LYMPH%: 11.5 % — ABNORMAL LOW (ref 14.0–49.0)
MCH: 29.3 pg (ref 27.2–33.4)
MCHC: 32.9 g/dL (ref 32.0–36.0)
MCV: 89 fL (ref 79.3–98.0)
MONO#: 0.9 10*3/uL (ref 0.1–0.9)
MONO%: 11.1 % (ref 0.0–14.0)
NEUT#: 6.1 10*3/uL (ref 1.5–6.5)
NEUT%: 75.5 % — ABNORMAL HIGH (ref 39.0–75.0)
Platelets: 102 10*3/uL — ABNORMAL LOW (ref 140–400)
RBC: 3.35 10*6/uL — ABNORMAL LOW (ref 4.20–5.82)
RDW: 15.2 % — AB (ref 11.0–14.6)
WBC: 8.1 10*3/uL (ref 4.0–10.3)

## 2017-06-26 LAB — MAGNESIUM: MAGNESIUM: 1.7 mg/dL (ref 1.5–2.5)

## 2017-06-26 NOTE — Telephone Encounter (Signed)
Gave patients daughter AVS report and calendar of upcoming September appointments.

## 2017-06-26 NOTE — Progress Notes (Signed)
[place]  Telephone:(336) [phone] Fax:(336) [phone]  Clinic follow up Note   Patient Care Team: [name], MD as PCP - General (Specialist) [name], CRNP as Nurse Practitioner (Nurse Practitioner) Truitt Merle, MD as Consulting Physician (Hematology) Ladene Artist, MD as Consulting Physician (Gastroenterology) 06/26/2017  CHIEF COMPLAINTS Follow up metastatic sigmoid colon cancer  Oncology History   He is a Metastatic colon cancer to liver   Staging form: Colon and Rectum, AJCC 7th Edition     Clinical: Stage Unknown (Joseph, NX, M1) - Unsigned      Cancer of sigmoid colon ([place])   10/28/2014 Tumor Marker    AFP 3.2 CEA > 10,000 CA 19-9 12,929.6. tumor (-) KRAS and NRAS mutation.       11/10/2014 Imaging    PET scan showed hypermetabolic mass in the sigmoid colon was noted metastasis in the retroperitoneum. Probable left adrenal and pulmonary metastasis, and diffuse liver metastasis.      11/21/2014 Initial Diagnosis    Metastatic colon cancer to liver, lung, abd nodes and left adrenal gland. Diagnosis was made by liver biopsy.       11/30/2014 - 05/23/2015 Chemotherapy    First line chemo [id number], Panitumumab added from second cycle, chemo held due to recurrent GI bleeding        12/28/2014 - 12/31/2014 Hospital Admission    Was admitted for dehydration, neutropenia fever with UTI, and severe skin rash.      02/08/2015 - 02/12/2015 Hospital Admission    He was admitted for upper GI bleeding, e.g. showed a gastric ulcer with clots, status post appendectomy injection. He also received a blood transfusion.      03/01/2015 Tumor Marker    CEA 694, CA19.9 462      03/13/2015 Imaging    CT CAP showed partial response, no new lesions.       05/31/2015 - 06/02/2015 Hospital Admission    He was admitted to [facility] in [place] due to upper GI bleeding, EGD showed gastric and dudenal ulcers       06/27/2015 - 06/29/2015 Hospital Admission    he was  admitted for dairrhea and pancolitis, c-diff and stool cultures were negative, treated with antibiotics      07/12/2015 - 05/14/2017 Chemotherapy    panitumumab 5m/kg, every 2 weeks      09/19/2015 - 05/14/2017 Chemotherapy    Irinotecan 1853mm2 every 2 weeks, dose reduction due to diarrhea. 5-Fu added on 06/10/2016 due to slight disease progression.   Due to his prolonged chemotherapy, I'll reduce his chemotherapy dose.  He will take Xeloda 15006mwice a day for 2 weeks, with the third week off. The patient will continue with oxali infusions every 3 weeks; Dose reduction to 70m67m for this cycles. Started 02/17/17.  Due to deterioration, hold CAPOX for now; continue vectibix today and every 2 weeks. -05/02/17      11/08/2015 - 11/10/2015 Hospital Admission    Pt was admitted for sepsis and hypotension, was found to have (+) influenza A and treated.       11/30/2015 Imaging     CT scans reviewed continued improvement in the liver metastasis, no other new lesions.      05/06/2016 Tumor Marker    CEA has gradually increased from 110 in March 2017 to 566 in July 2017      06/04/2016 Imaging    Restaging CT scan showed similar to mild increase in hepatic metastasis, mild enlargement of right adrenal  nodule, primary similar pulmonary nodules. A left upper lobe nodule has resolved.      11/12/2016 Imaging    CT CAP - Restaging IMPRESSION: 1. No significant change in the appearance of multifocal pulmonary nodules. 2. Previously referenced partially calcified liver metastases are stable from previous exam. Within the posterior right lobe of liver there is a metastasis which appears slightly increased in size from previous exam. 3. Mild decrease in size of right adrenal nodule. 4. There is mild wall thickening and inflammation involving the descending colon and proximal sigmoid colon worrisome for colitis. A small intramural fluid collection is identified which is unchanged from 08/19/2016,  significance unknown. 5. Aortic atherosclerosis.      02/03/2017 Imaging    CT CAP w Contrast Chest Impression: 1. Interval increase in size of pulmonary nodules as well as several new pulmonary nodules consistent with progression of pulmonary metastasis.  Abdomen / Pelvis Impression: 1. Interval increase in size of hepatic metastasis within LEFT and RIGHT hepatic lobe. 2. 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 Enhanced Submucosal collections in the proximal sigmoid colon are concerning for colorectal carcinoma recurrence. Resolution of descending colitis.      05/07/2017 Imaging    CT CAP W Contrast 05/07/17 IMPRESSION: Chest Impression: 1. Bilateral small pulmonary are increased in size by 2 mm. Findings consistent with tumor progression. 2. No lymphadenopathy. Abdomen / Pelvis Impression: 1. Interval increase in size of enhancing lesion in the anterior RIGHT hepatic lobe. Other hepatic lesions appear stable. 2. Decrease in size of splenic lesion. 3. Stable RIGHT adrenal metastasis. 4. Submucosal Cystic lesion the descending colon is stable. 5. No new metastatic disease in the abdomen pelvis. 6. Recanalization umbilical vein.      05/08/2017 Imaging    MRI Brain 05/08/17 IMPRESSION: No evidence of metastatic disease. Background pattern of extensive chronic small-vessel ischemic changes affecting the cerebral hemispheres. 1 cm acute infarction in the radiating white matter tracts on the right, presumably incidentally detected.       HISTORY OF PRESENTING ILLNESS:  Jason Reilly 71 y.o. male is here because of abnormal CT findings, which is very suspicious for malignancy. He is on ranitidine from [place], has been on in the [place] for 16 years. He came in with his son and an interpreter.  He has been feeling fatigued since two month ago. He is still able to do all ADLs. He otherwise denies any pain, bloating or nausea.  He lost about 20lbs in 3 month. His  appetite is lower than before, eats less, no change of his bowl habits.  She denied any hematochezia or melana. Per his son, he has had some personality changes daily, irritable, slightly confused some time.  He was evaluated by his primary care physician. Lab test reviewed hepatitis B infection, which he did not know before, and elevated alkaline phosphatase, his liver function and the rest of the liver function was not remarkable. [place] of abdomen was obtained on 07/22/2014, which showed diffusely abnormal liver with multiple echogenic lesions. CT of abdomen with and without contrast was done on 08/26/2014, which reviewed here at a medically with multiple large partially calcified hepatic masses consistent with metastatic disease. Mild retroperitoneal adenopathy with the largest node measuring 1.6 cm. And nonspecific 1.4 cm left adrenal nodule was also noticed. His tumor marker showed CEA greater than 10,000, CA 19-9 12,929, AFP 3.2 (normal). He was referred to [place] [facility] liver clinic and was evaluated by nurse practitioner [name]. Treatment for hepatitis  B was not recommended based on his virus load.  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 He was evaluated by a cardiologist in 2014. He denies any significant dyspnea on exertion. No leg swollen.  CURRENT THERAPY:  Supportive Care   INTERIM HISTORY:  Jason Reilly returns for follow-up. He presents to the clinic today his daughter and Optometrist. He wears mask due to saliva dripping.  His daughter reports to him slumping, he will not talk much and he will lose his mind and they will not let them go outside alone much. He will sneak out by himself. He does not have cell phone because he will not use it.  She reports he does not want to go to sleep.  She reports his BM and urine is good and he denies any pain.     MEDICAL HISTORY:  Past Medical History:  Diagnosis Date  . Abnormal EKG   .  Diabetes mellitus without complication (HCC)    metformin  . Gastric ulcer   . H. pylori infection   . Hepatitis B   . Hypertension   . Metastatic colon cancer to liver ([place])   . Non-ischemic cardiomyopathy ([place])   . Tachycardia     SURGICAL HISTORY: Past Surgical History:  Procedure Laterality Date  . ESOPHAGOGASTRODUODENOSCOPY N/A 02/08/2015   Procedure: ESOPHAGOGASTRODUODENOSCOPY (EGD);  Surgeon: Ladene Artist, MD;  Location: [facility];  Service: Endoscopy;  Laterality: N/A;  . ESOPHAGOGASTRODUODENOSCOPY (EGD) WITH PROPOFOL N/A 08/15/2015   Procedure: ESOPHAGOGASTRODUODENOSCOPY (EGD) WITH PROPOFOL;  Surgeon: Ladene Artist, MD;  Location: [facility] ENDOSCOPY;  Service: Endoscopy;  Laterality: N/A;  . LEFT AND RIGHT HEART CATHETERIZATION WITH CORONARY ANGIOGRAM N/A 03/29/2013   Procedure: LEFT AND RIGHT HEART CATHETERIZATION WITH CORONARY ANGIOGRAM;  Surgeon: Pixie Casino, MD;  Location: [facility] CATH LAB;  Service: Cardiovascular;  Laterality: N/A;  . Nuclear Stress Test  03/03/2013   High risk - consistent with nonischemic cardiomyopathy    SOCIAL HISTORY: Social History   Social History  . Marital status: Married    Spouse name: N/A  . Number of children: N/A  . Years of education: N/A   Occupational History  . Not on file.   Social History Main Topics  . Smoking status: Former Smoker    Types: Cigarettes    Quit date: 11/07/2008  . Smokeless tobacco: Never Used  . Alcohol use Yes     Comment: occasional  . Drug use: No  . Sexual activity: No   Other Topics Concern  . Not on file   Social History Narrative   Married   Enjoys walking   Has lived in [place] > 16 years    FAMILY HISTORY: No family history of liver disease or malignancy.  ALLERGIES:  has No Known Allergies.  MEDICATIONS:  Current Outpatient Prescriptions on File Prior to Visit  Medication Sig Dispense Refill  . clindamycin (CLINDAGEL) 1 % gel Apply topically 2 (two) times daily. 30 g 2  .  diphenoxylate-atropine (LOMOTIL) 2.5-0.025 MG tablet Take 1-2 tablets by mouth 4 (four) times daily as needed for diarrhea or loose stools. 30 tablet 0  . folic acid (FOLVITE) 1 MG tablet Take 1 tablet (1 mg total) by mouth daily. 30 tablet 3  . hydrocortisone 1 % ointment Apply 1 application topically 2 (two) times daily. 56 g 0  . lidocaine-prilocaine (EMLA) cream Apply 1 application topically as needed. Apply to [facility] cath at least one and a half hour to  two hours  before needle stick as needed. 30 g 2  . lisinopril-hydrochlorothiazide (PRINZIDE,ZESTORETIC) 10-12.5 MG tablet     . loperamide (IMODIUM) 2 MG capsule Take 2 capsules (4 mg total) by mouth 4 (four) times daily as needed for diarrhea or loose stools (NO MORE THAN 8 TABLETS PER DAY.). 60 capsule 3  . magic mouthwash SOLN Take 5 mLs by mouth 4 (four) times daily. 120 mL 1  . magnesium oxide (MAG-OX) 400 (241.3 Mg) MG tablet Take 1 tablet (400 mg total) by mouth 3 (three) times daily. 90 tablet 3  . metFORMIN (GLUCOPHAGE) 500 MG tablet Take 1 tablet (500 mg total) by mouth 2 (two) times daily with a meal. 60 tablet 0  . nadolol (CORGARD) 20 MG tablet Take 1 tablet (20 mg total) by mouth 2 (two) times daily. THIS IS A ONE TIME ORDER.  FUTURE REFILLS NEED TO BE DONE BY PRIMARY MD. 60 tablet 1  . ondansetron (ZOFRAN) 8 MG tablet Take 1 tablet (8 mg total) by mouth every 8 (eight) hours as needed for nausea or vomiting. 45 tablet 2  . pantoprazole (PROTONIX) 40 MG tablet Take 1 tablet (40 mg total) by mouth daily. 30 tablet 3  . potassium chloride SA (K-DUR,KLOR-CON) 20 MEQ tablet Take 1 tablet (20 mEq total) by mouth daily. 14 tablet 0  . prochlorperazine (COMPAZINE) 10 MG tablet TAKE 1 TABLET BY MOUTH ONCE DAILY EVERY 6 HOURS AS NEEDED FOR NAUSEA AND VOMITING 30 tablet 3   Current Facility-Administered Medications on File Prior to Visit  Medication Dose Route Frequency Provider Last Rate Last Dose  . 0.9 %  sodium chloride infusion    Intravenous Once Truitt Merle, MD      . 0.9 %  sodium chloride infusion  250 mL Intravenous Once Truitt Merle, MD      . acetaminophen (TYLENOL) tablet 650 mg  650 mg Oral Once Truitt Merle, MD      . diphenhydrAMINE (BENADRYL) capsule 25 mg  25 mg Oral Once Truitt Merle, MD      . ferumoxytol [facility]) 510 mg in sodium chloride 0.9 % 100 mL IVPB  510 mg Intravenous Once Truitt Merle, MD      . sodium chloride 0.9 % injection 10 mL  10 mL Intracatheter PRN Truitt Merle, MD   10 mL at 10/03/15 1705  . sodium chloride 0.9 % injection 10 mL  10 mL Intravenous PRN Truitt Merle, MD   10 mL at 05/14/16 1127   REVIEW OF SYSTEMS:   Constitutional: Denies fevers, chills or abnormal night sweats  (+) increased fatigue  (+) memory loss (+) speech slurring is worsening (+) trouble sleeping Eyes: Denies blurriness of vision, double vision or watery eyes Ears, nose, mouth, throat, and face: Denies mucositis, sore throat, or dry mouth (+) drools unknowingly Respiratory: Denies cough, dyspnea or wheezes Cardiovascular: Denies palpitation, chest discomfort or lower extremity swelling Gastrointestinal: Denies nausea, heartburn or change in bladder/ bowel habits Skin: (+) itching at port site  Lymphatics: Denies new lymphadenopathy or easy bruising Neurological:Denies numbness, tingling or new weaknesses Behavioral/Psych: Mood is stable, no new changes  All other systems were reviewed with the patient and are negative.  PHYSICAL EXAMINATION: ECOG PERFORMANCE STATUS: 3  Vitals:   06/26/17 1435  BP: (!) 137/55  Pulse: 66  Resp: 18  Temp: 98.8 F (37.1 C)  TempSrc: Oral  SpO2: 100%  Weight: 135 lb 4.8 oz (61.4 kg)  Height: '5\' 5"'  (1.651 m)  GENERAL:alert, no distress and comfortable (+) appears slightly lost, (+) saliva drooping on his mouth (+) speech slurring is worsening SKIN: (+) Dry skin,  no skin ulcer, (+) previous scatter skin rashes on his neck and upper chest are most resolved  EYES: normal, conjunctiva are  pink and non-injected, sclera clear OROPHARYNX:no exudate, no erythema and lips, buccal mucosa, and tongue with mild discoloration at the tip. The low front teeth are missing  NECK: supple, thyroid normal size, non-tender, without nodularity LYMPH:  no palpable lymphadenopathy in the cervical, axillary or inguinal LUNGS: clear to auscultation and percussion with normal breathing effort, no rales  HEART: regular rate & rhythm and no murmurs and no lower extremity edema ABDOMEN:abdomen soft, non-tender, no hepatomegaly, no splenomegaly and normal bowel sounds Musculoskeletal:no cyanosis of digits and no clubbing  PSYCH: alert & oriented x 3 with fluent speech NEURO: no focal motor/sensory deficits, but his speech and response is slow.  LABORATORY DATA:  I have reviewed the data as listed CBC Latest Ref Rng & Units 06/26/2017 06/04/2017 05/28/2017  WBC 4.0 - 10.3 10e3/uL 8.1 4.2 5.0  Hemoglobin 13.0 - 17.1 g/dL 9.8(L) 7.9(L) 8.7(L)  Hematocrit 38.4 - 49.9 % 29.8(L) 23.8(L) 26.2(L)  Platelets 140 - 400 10e3/uL 102(L) 117(L) 92(L)   CMP Latest Ref Rng & Units 06/26/2017 05/28/2017 05/14/2017  Glucose 70 - 140 mg/dl 206(H) 124 122  BUN 7.0 - 26.0 mg/dL 15.2 15.2 14.3  Creatinine 0.7 - 1.3 mg/dL 1.1 1.0 0.9  Sodium 136 - 145 mEq/L 135(L) 137 141  Potassium 3.5 - 5.1 mEq/L 4.7 4.1 4.4  Chloride 101 - 111 mmol/L - - -  CO2 22 - 29 mEq/L '25 23 25  ' Calcium 8.4 - 10.4 mg/dL 9.7 9.1 8.8  Total Protein 6.4 - 8.3 g/dL 8.8(H) 8.2 8.4(H)  Total Bilirubin 0.20 - 1.20 mg/dL 1.31(H) 1.06 0.77  Alkaline Phos 40 - 150 U/L 366(H) 184(H) 177(H)  AST 5 - 34 U/L 61(H) 43(H) 52(H)  ALT 0 - 55 U/L '30 17 23   ' INITIAL tumor markers AFP 3.2 CEA > 10,000 CA 19-9 12,929.6   CEA  01/11/2015: 1809 07/05/2015: 386 10/03/2015: 66.4 02/19/2016: 155.8 05/06/2016: 566 07/08/2016: 834 08/05/2016: 1102 10/07/2016: 1733 11/06/2016: 2214.95 12/16/2016: 2858.90 01/06/2017: 3495 02/03/17: 7247 03/12/17: 5424.61 04/02/17:  3592.19 05/02/17: 4197.89 06/04/17: 9032.69   PATHOLOGY Liver, needle/core biopsy 11/21/2014 - METASTATIC ADENOCARCINOMA, SEE COMMENT. Microscopic Comment The adenocarcinoma demonstrates the following immunophenotype: Cytokeratin 7 - negative expression. Cytokeratin 20 - strong diffuse expression. CD2 - strong diffuse expression. Overall the morphology and immunophenotype are that of metastatic adenocarcinoma primary to colorectum. The recent nuclear medicine scan demonstrating sigmoid mass with associated liver masses is noted.  [facility] test result:    RADIOGRAPHIC STUDIES: I have personally reviewed the outside CT scan image with patient and his son.   MRI Brain 05/08/17 IMPRESSION: No evidence of metastatic disease. Background pattern of extensive chronic small-vessel ischemic changes affecting the cerebral hemispheres. 1 cm acute infarction in the radiating white matter tracts on the right, presumably incidentally detected.   CT CAP W Contrast 05/07/17 IMPRESSION: Chest Impression: 1. Bilateral small pulmonary are increased in size by 2 mm. Findings consistent with tumor progression. 2. No lymphadenopathy. Abdomen / Pelvis Impression: 1. Interval increase in size of enhancing lesion in the anterior RIGHT hepatic lobe. Other hepatic lesions appear stable. 2. Decrease in size of splenic lesion. 3. Stable RIGHT adrenal metastasis. 4. Submucosal Cystic lesion the descending colon is stable. 5. No new metastatic  disease in the abdomen pelvis. 6. Recanalization umbilical vein.   CT CAP w Contrast 02/03/2017 IMPRESSION: Chest Impression: 1. Interval increase in size of pulmonary nodules as well as several new pulmonary nodules consistent with progression of pulmonary metastasis. Abdomen / Pelvis Impression: 1. Interval increase in size of hepatic metastasis within LEFT and RIGHT hepatic lobe. 2. 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 Enhanced  Submucosal collections in the proximal sigmoid colon are concerning for colorectal carcinoma recurrence. Resolution of descending colitis.   ASSESSMENT & PLAN:  71 y.o. [place] male, with past history of hypertension and dilated nonischemic gammopathy with EF 25%, no clinical signs of heart failure, who was found to have hepatitis B infection lately, and multiple liver lesions on the CT scan. He has extremely high CEA and CA 19-9 levels. PET scan reviewed a hypermetabolic sigmoid colon mass, diffuse liver metastasis, probable lung and adrenal gland metastasis.  1. Metastatic sigmoid colon cancer, with diffuse liver, lungs, node and left adrenal gland metastases. KRAS/NRAS wild type, MSI-stable -Previous Liver biopsy showed metastatic adenocarcinoma. His tumor were strongly positive for CK20 and CD2, consistent with primary colorectal primary. KRAS and NRAS mutations were not detected.  -Pt understands that this is incurable cancer, and he has very high disease burden and overall prognosis is poor. The treatment goal is palliative -I previously discussed his restaging CT from 08/19/2016 which showed overall stable disease in liver, a few small lung nodules, slightly bigger, will continue monitoring. - his previous scan showed overall some improvement since we restarted on 5-FU in August 2017. -His tumor marker CEA has been trending up, concerning for slow disease progression, we'll continue monitoring -I previously reviewed the CT scan from 02/03/2017 with the patient in detail, images reviewed in person. Unfortunately he has had significant disease progression in the liver -He has had FOLFOX and FOLFIRI, and CAPOX, plus panitumumab  -We reviewed his recent MRI brian scan that shows no evidence of metastasis. I believe his confusion is related to chemo brain or possible underline dementia.  -we also reviewed his restating CT scan from 05/07/2017 which showed evidence of cancer progression in the liver,  and multiple small lung nodules slightly increased in size. Due to the cancer progression, and overall deteriorated performance status recently, I recommend him to stop CAPOX and panitumumab  -We discussed the options for third and fourth line treatment, which is limited, and response rate in the benefit are likely to be small.  -Due to his deteriorating of his performance status, palliative care and hospice is also reasonable at this stage, I encouraged patient and his family to consider. -After lengthy previous discussion, patient and his son decided take chemo break for now, and see how things go. He decided not to start [place] for now.  -continue supportive care.  -Labs reviewed. His liver function has slightly worsened. His sugar is 206.  I strongly encourage him to more water. - His Hg is 9.8 and he does not need blood transfution  -We will continue cancer observation -Will f/u in 4 weeks -recommend hospice if he deteriorates   2. memory loss and confusion/Dementia -He has had more memory loss lately, his wife manage his medications at home. -He denied headaches or other neurological symptoms. -Chemo brain versus underlying dementia, rule out brain mets  -I ordered a brain MRI to ruled out brain metastasis, given his worsening memory loss and confusion -With 05/08/17 Brain MRI, Brain mets have been ruled out.  -I discussed the likelihood  of him having dementia with increase in memory loss, confusion, and slowed speech.  -will check B12 level on next visit  -Doing his underline metastatic colon cancer, his life expectancy is less than one year, I do not see much benefit of treat his dementia with medication.   3. Gastric ulcer with significant GI bleeding in April and Aug 2016 -He is on PPI, continue once daily  -Repeat his EGD on 08/15/2015 showed near complete healing of his gastric ulcer -continue Nadolol 60m bid, per Dr. SFuller Plan- I previously encourage him to follow-up with Dr.  [facility]uller [facility]   4. Type 2 DM  -His glucose level has increased lately, it was 267 on 12/02/16. I encouraged him to watch his diet, avoid any sweets, and monitor closely at home -I previously encouraged him to follow-up with his primary care physician Dr. WJimmye Norman He is in the process to change his primary care physician. -I previously recommended him to increase metformin to 1000 mg twice daily, to better control his hyperglycemia.  5. HTN, Dilated nonischemic ischemia cardiomyopathy with EF 25% -He is clinically doing well without symptoms of CHF. However this is probably going to impact his chemotherapy.Will try to avoid cardiotoxic chemotherapy agent and avoid fluid overload during chemotherapy. -Continue follow-up with cardiology. -will hold on his lisinopril-HCTZ for now, his BP has been normal previously   6 Hepatitis B carrier, with mild portal hypertension  -Per liver clinic, no need for treatment. Follow-up with Dr. SSilvio Pate  7. Malnutrition -I previously encouraged him to eat more, and take supplements as needed. -improved, gained weight back after I reduce his chemotherapy dose  -follow up with Dietitian  -He does not want to eat. I discussed with his son to increase ensure to 2 a day and trying glucerna.  -I previously encouraged him to eat what he would like liberally as long as his side effects can tolerate it.   8. Anemia secondary to GI bleeding, iron deficiency and chemo  -Repeat lab on 06/06/2015 showed ferritin 117, serum iron 24, saturation 8%, which supports iron deficiency -He received IV Feraheme again in Aug 2016 after GI bleeding  -Repeat a ferritin was 273 on 10/03/2015, much improved  -he received iv feraheme again on 11/27/2015 and 12/04/2015, but anemia did not improve much. -His anemia has been slightly worse previously, probably related to chemotherapy, we'll close monitor any signs of GI bleeding. -Blood transfusion if hemoglobin less than 8  or symptomatic anemia with hemoglobin 8-9 -We previously discussed that the patient does not need a refill on his Folic Acid since he is not drinking alcohol anymore. -Hg improved to 9.8 today.   9. hypomagnesemia  -secondary to panitumumab -he receives IV mag 6 g every week  -He is taking magnesium pill 2 tablets 3 times a day -continue monitoring  -Stopped panitumumab and will monitor recovery  -Last Magnesium infusion (06/04/17) since his counts are much improved.   10. Goal of care discussion, DNR/DNI -We again discussed the incurable nature of his cancer, and the overall poor prognosis, especially if he does not have good response to chemotherapy or progress on chemo -The patient understands the goal of care is palliative. -I again recommended DNR/DNI, he finally agreed. -I suggest that if he is not able to make the decision himself he can appoint a POA to make the decision.  -I will refer to social worker for help with paperwork.   11. Insomnia -to help him sleep I  recommend over-the-counter benadryl and melatonin.  -if not enough, I will call in low dose trazodone   Plan -Lab, flush and f/u in 4 weeks  -continue supportive care at home  -His family knows to call me if he develops worsening symptoms   All questions were answered. The patient knows to call the clinic with any problems, questions or concerns.  I spent 25 minutes counseling the patient face to face. The total time spent in the appointment was 30 minutes and more than 50% was on counseling.  This document serves as a record of services personally performed by Truitt Merle, MD. It was created on her behalf by Joslyn Devon, a trained medical scribe. The creation of this record is based on the scribe's personal observations and the provider's statements to them. This document has been checked and approved by the attending provider.   I have reviewed the above documentation for accuracy and completeness and I agree with  the above.   Truitt Merle, MD 06/26/2017

## 2017-07-22 NOTE — Progress Notes (Signed)
[place]  Telephone:([phone]) [phone] Fax:(336) [phone]  Clinic follow up Note   Patient Care Team: [name], MD as PCP - General (Specialist) [name], CRNP as Nurse Practitioner (Nurse Practitioner) Truitt Merle, MD as Consulting Physician (Hematology) Ladene Artist, MD as Consulting Physician (Gastroenterology) 07/24/2017  CHIEF COMPLAINTS Follow up metastatic sigmoid colon cancer  Oncology History   He is a Metastatic colon cancer to liver   Staging form: Colon and Rectum, AJCC 7th Edition     Clinical: Stage Unknown ([place], NX, M1) - Unsigned      Cancer of sigmoid colon ([place])   10/28/2014 Tumor Marker    AFP 3.2 CEA > 10,000 CA 19-9 12,929.6. tumor (-) KRAS and NRAS mutation.       11/10/2014 Imaging    PET scan showed hypermetabolic mass in the sigmoid colon was noted metastasis in the retroperitoneum. Probable left adrenal and pulmonary metastasis, and diffuse liver metastasis.      11/21/2014 Initial Diagnosis    Metastatic colon cancer to liver, lung, abd nodes and left adrenal gland. Diagnosis was made by liver biopsy.       11/30/2014 - 05/23/2015 Chemotherapy    First line chemo [id number], Panitumumab added from second cycle, chemo held due to recurrent GI bleeding        12/28/2014 - 12/31/2014 Hospital Admission    Was admitted for dehydration, neutropenia fever with UTI, and severe skin rash.      02/08/2015 - 02/12/2015 Hospital Admission    He was admitted for upper GI bleeding, e.g. showed a gastric ulcer with clots, status post appendectomy injection. He also received a blood transfusion.      03/01/2015 Tumor Marker    CEA 694, CA19.9 462      03/13/2015 Imaging    CT CAP showed partial response, no new lesions.       05/31/2015 - 06/02/2015 Hospital Admission    He was admitted to [facility] in [place] due to upper GI bleeding, EGD showed gastric and dudenal ulcers       06/27/2015 - 06/29/2015 Hospital Admission    he was  admitted for dairrhea and pancolitis, c-diff and stool cultures were negative, treated with antibiotics      07/12/2015 - 05/14/2017 Chemotherapy    panitumumab 40m/kg, every 2 weeks      09/19/2015 - 05/14/2017 Chemotherapy    Irinotecan 1872mm2 every 2 weeks, dose reduction due to diarrhea. 5-Fu added on 06/10/2016 due to slight disease progression.   Due to his prolonged chemotherapy, I'll reduce his chemotherapy dose.  He will take Xeloda 150034mwice a day for 2 weeks, with the third week off. The patient will continue with oxali infusions every 3 weeks; Dose reduction to 48m73m for this cycles. Started 02/17/17.  Due to deterioration, hold CAPOX for now; continue vectibix today and every 2 weeks. -05/02/17      11/08/2015 - 11/10/2015 Hospital Admission    Pt was admitted for sepsis and hypotension, was found to have (+) influenza A and treated.       11/30/2015 Imaging     CT scans reviewed continued improvement in the liver metastasis, no other new lesions.      05/06/2016 Tumor Marker    CEA has gradually increased from 110 in March 2017 to 566 in July 2017      06/04/2016 Imaging    Restaging CT scan showed similar to mild increase in hepatic metastasis, mild enlargement of right adrenal  nodule, primary similar pulmonary nodules. A left upper lobe nodule has resolved.      11/12/2016 Imaging    CT CAP - Restaging IMPRESSION: 1. No significant change in the appearance of multifocal pulmonary nodules. 2. Previously referenced partially calcified liver metastases are stable from previous exam. Within the posterior right lobe of liver there is a metastasis which appears slightly increased in size from previous exam. 3. Mild decrease in size of right adrenal nodule. 4. There is mild wall thickening and inflammation involving the descending colon and proximal sigmoid colon worrisome for colitis. A small intramural fluid collection is identified which is unchanged from 08/19/2016,  significance unknown. 5. Aortic atherosclerosis.      02/03/2017 Imaging    CT CAP w Contrast Chest Impression: 1. Interval increase in size of pulmonary nodules as well as several new pulmonary nodules consistent with progression of pulmonary metastasis.  Abdomen / Pelvis Impression: 1. Interval increase in size of hepatic metastasis within LEFT and RIGHT hepatic lobe. 2. 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 Enhanced Submucosal collections in the proximal sigmoid colon are concerning for colorectal carcinoma recurrence. Resolution of descending colitis.      05/07/2017 Imaging    CT CAP W Contrast 05/07/17 IMPRESSION: Chest Impression: 1. Bilateral small pulmonary are increased in size by 2 mm. Findings consistent with tumor progression. 2. No lymphadenopathy. Abdomen / Pelvis Impression: 1. Interval increase in size of enhancing lesion in the anterior RIGHT hepatic lobe. Other hepatic lesions appear stable. 2. Decrease in size of splenic lesion. 3. Stable RIGHT adrenal metastasis. 4. Submucosal Cystic lesion the descending colon is stable. 5. No new metastatic disease in the abdomen pelvis. 6. Recanalization umbilical vein.      05/08/2017 Imaging    MRI Brain 05/08/17 IMPRESSION: No evidence of metastatic disease. Background pattern of extensive chronic small-vessel ischemic changes affecting the cerebral hemispheres. 1 cm acute infarction in the radiating white matter tracts on the right, presumably incidentally detected.       HISTORY OF PRESENTING ILLNESS:  Jason Reilly 71 y.o. male is here because of abnormal CT findings, which is very suspicious for malignancy. He is on ranitidine from [place], has been on in the [place] for 16 years. He came in with his son and an interpreter.  He has been feeling fatigued since two month ago. He is still able to do all ADLs. He otherwise denies any pain, bloating or nausea.  He lost about 20lbs in 3 month. His  appetite is lower than before, eats less, no change of his bowl habits.  She denied any hematochezia or melana. Per his son, he has had some personality changes daily, irritable, slightly confused some time.  He was evaluated by his primary care physician. Lab test reviewed hepatitis B infection, which he did not know before, and elevated alkaline phosphatase, his liver function and the rest of the liver function was not remarkable. [place] of abdomen was obtained on 07/22/2014, which showed diffusely abnormal liver with multiple echogenic lesions. CT of abdomen with and without contrast was done on 08/26/2014, which reviewed here at a medically with multiple large partially calcified hepatic masses consistent with metastatic disease. Mild retroperitoneal adenopathy with the largest node measuring 1.6 cm. And nonspecific 1.4 cm left adrenal nodule was also noticed. His tumor marker showed CEA greater than 10,000, CA 19-9 12,929, AFP 3.2 (normal). He was referred to [facility] and was evaluated by nurse practitioner [name]. Treatment for hepatitis  B was not recommended based on his virus load.  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 He was evaluated by a cardiologist in 2014. He denies any significant dyspnea on exertion. No leg swollen.  CURRENT THERAPY:  Supportive Care   INTERIM HISTORY:  Siddhanth returns for follow-up. He presents to the clinic today his son and Optometrist.  He wears mask due to saliva dripping.  Per translator, he has been doing OK but does not eat much. Denies nausea.  Per his son, he does not do much at home or go out. He says patient complains of some back pains at time but seems to have resolved now. He eats very small meal portions. He does not talk much at home. Overall, he is much less active and eats less lately.     MEDICAL HISTORY:  Past Medical History:  Diagnosis Date  . Abnormal EKG   .  Diabetes mellitus without complication (HCC)    metformin  . Gastric ulcer   . H. pylori infection   . Hepatitis B   . Hypertension   . Metastatic colon cancer to liver ([place])   . Non-ischemic cardiomyopathy ([place])   . Tachycardia     SURGICAL HISTORY: Past Surgical History:  Procedure Laterality Date  . ESOPHAGOGASTRODUODENOSCOPY N/A 02/08/2015   Procedure: ESOPHAGOGASTRODUODENOSCOPY (EGD);  Surgeon: Ladene Artist, MD;  Location: [facility];  Service: Endoscopy;  Laterality: N/A;  . ESOPHAGOGASTRODUODENOSCOPY (EGD) WITH PROPOFOL N/A 08/15/2015   Procedure: ESOPHAGOGASTRODUODENOSCOPY (EGD) WITH PROPOFOL;  Surgeon: Ladene Artist, MD;  Location: [facility]COPY;  Service: Endoscopy;  Laterality: N/A;  . LEFT AND RIGHT HEART CATHETERIZATION WITH CORONARY ANGIOGRAM N/A 03/29/2013   Procedure: LEFT AND RIGHT HEART CATHETERIZATION WITH CORONARY ANGIOGRAM;  Surgeon: Pixie Casino, MD;  Location: [facility] CATH LAB;  Service: Cardiovascular;  Laterality: N/A;  . Nuclear Stress Test  03/03/2013   High risk - consistent with nonischemic cardiomyopathy    SOCIAL HISTORY: Social History   Social History  . Marital status: Married    Spouse name: N/A  . Number of children: N/A  . Years of education: N/A   Occupational History  . Not on file.   Social History Main Topics  . Smoking status: Former Smoker    Types: Cigarettes    Quit date: 11/07/2008  . Smokeless tobacco: Never Used  . Alcohol use Yes     Comment: occasional  . Drug use: No  . Sexual activity: No   Other Topics Concern  . Not on file   Social History Narrative   Married   Enjoys walking   Has lived in [place] > 16 years    FAMILY HISTORY: No family history of liver disease or malignancy.  ALLERGIES:  has No Known Allergies.  MEDICATIONS:  Current Outpatient Prescriptions on File Prior to Visit  Medication Sig Dispense Refill  . clindamycin (CLINDAGEL) 1 % gel Apply topically 2 (two) times daily. 30 g 2  .  diphenoxylate-atropine (LOMOTIL) 2.5-0.025 MG tablet Take 1-2 tablets by mouth 4 (four) times daily as needed for diarrhea or loose stools. 30 tablet 0  . folic acid (FOLVITE) 1 MG tablet Take 1 tablet (1 mg total) by mouth daily. 30 tablet 3  . hydrocortisone 1 % ointment Apply 1 application topically 2 (two) times daily. 56 g 0  . lidocaine-prilocaine (EMLA) cream Apply 1 application topically as needed. Apply to [facility] cath at least one and a half hour to two hours  before needle stick as needed. 30 g 2  . lisinopril-hydrochlorothiazide (PRINZIDE,ZESTORETIC) 10-12.5 MG tablet     . magic mouthwash SOLN Take 5 mLs by mouth 4 (four) times daily. 120 mL 1  . magnesium oxide (MAG-OX) 400 (241.3 Mg) MG tablet Take 1 tablet (400 mg total) by mouth 3 (three) times daily. 90 tablet 3  . metFORMIN (GLUCOPHAGE) 500 MG tablet Take 1 tablet (500 mg total) by mouth 2 (two) times daily with a meal. 60 tablet 0  . nadolol (CORGARD) 20 MG tablet Take 1 tablet (20 mg total) by mouth 2 (two) times daily. THIS IS A ONE TIME ORDER.  FUTURE REFILLS NEED TO BE DONE BY PRIMARY MD. 60 tablet 1  . ondansetron (ZOFRAN) 8 MG tablet Take 1 tablet (8 mg total) by mouth every 8 (eight) hours as needed for nausea or vomiting. 45 tablet 2  . pantoprazole (PROTONIX) 40 MG tablet Take 1 tablet (40 mg total) by mouth daily. 30 tablet 3  . potassium chloride SA (K-DUR,KLOR-CON) 20 MEQ tablet Take 1 tablet (20 mEq total) by mouth daily. 14 tablet 0  . prochlorperazine (COMPAZINE) 10 MG tablet TAKE 1 TABLET BY MOUTH ONCE DAILY EVERY 6 HOURS AS NEEDED FOR NAUSEA AND VOMITING 30 tablet 3  . loperamide (IMODIUM) 2 MG capsule Take 2 capsules (4 mg total) by mouth 4 (four) times daily as needed for diarrhea or loose stools (NO MORE THAN 8 TABLETS PER DAY.). (Patient not taking: Reported on 07/24/2017) 60 capsule 3   Current Facility-Administered Medications on File Prior to Visit  Medication Dose Route Frequency Provider Last Rate Last Dose    . 0.9 %  sodium chloride infusion   Intravenous Once Truitt Merle, MD      . 0.9 %  sodium chloride infusion  250 mL Intravenous Once Truitt Merle, MD      . acetaminophen (TYLENOL) tablet 650 mg  650 mg Oral Once Truitt Merle, MD      . diphenhydrAMINE (BENADRYL) capsule 25 mg  25 mg Oral Once Truitt Merle, MD      . ferumoxytol [facility]) 510 mg in sodium chloride 0.9 % 100 mL IVPB  510 mg Intravenous Once Truitt Merle, MD      . sodium chloride 0.9 % injection 10 mL  10 mL Intracatheter PRN Truitt Merle, MD   10 mL at 10/03/15 1705  . sodium chloride 0.9 % injection 10 mL  10 mL Intravenous PRN Truitt Merle, MD   10 mL at 05/14/16 1127   REVIEW OF SYSTEMS:   Constitutional: Denies fevers, chills or abnormal night sweats   (+) loss 7 pounds (+) speech slurring is worsening  Eyes: Denies blurriness of vision, double vision or watery eyes Ears, nose, mouth, throat, and face: Denies mucositis, sore throat, or dry mouth (+) drools unknowingly Respiratory: Denies cough, dyspnea or wheezes Cardiovascular: Denies palpitation, chest discomfort or lower extremity swelling Gastrointestinal: Denies nausea, heartburn or change in bladder/ bowel habits Skin: normal Lymphatics: Denies new lymphadenopathy or easy bruising Neurological:Denies numbness, tingling or new weaknesses Behavioral/Psych: Mood is stable, no new changes  All other systems were reviewed with the patient and are negative.  PHYSICAL EXAMINATION:  ECOG PERFORMANCE STATUS: 3  Vitals:   07/24/17 0842  BP: 118/61  Pulse: 76  Resp: 18  Temp: 97.9 F (36.6 C)  TempSrc: Oral  SpO2: 100%  Weight: 128 lb 4.8 oz (58.2 kg)  Height: '5\' 5"'  (1.651 m)    GENERAL:alert, no distress and  comfortable (+) appears slightly lost, (+) saliva drooping on his mouth (+) speech slurring is worsening SKIN:  (+) previous scatter skin rashes on his neck and upper chest are most resolved  EYES: normal, conjunctiva are pink and non-injected, sclera clear OROPHARYNX:no  exudate, no erythema and lips, buccal mucosa, and tongue with mild discoloration at the tip. The low front teeth are missing  NECK: supple, thyroid normal size, non-tender, without nodularity LYMPH:  no palpable lymphadenopathy in the cervical, axillary or inguinal LUNGS: clear to auscultation and percussion with normal breathing effort, no rales  HEART: regular rate & rhythm and no murmurs and no lower extremity edema ABDOMEN:abdomen soft, non-tender, no hepatomegaly, no splenomegaly and normal bowel sounds Musculoskeletal:no cyanosis of digits and no clubbing  PSYCH: alert & oriented x 3 with fluent speech NEURO: no focal motor/sensory deficits, but his speech and response is slow.  LABORATORY DATA:  I have reviewed the data as listed CBC Latest Ref Rng & Units 07/24/2017 06/26/2017 06/04/2017  WBC 4.0 - 10.3 10e3/uL 6.9 8.1 4.2  Hemoglobin 13.0 - 17.1 g/dL 8.9(L) 9.8(L) 7.9(L)  Hematocrit 38.4 - 49.9 % 27.3(L) 29.8(L) 23.8(L)  Platelets 140 - 400 10e3/uL 133(L) 102(L) 117(L)   CMP Latest Ref Rng & Units 07/24/2017 06/26/2017 05/28/2017  Glucose 70 - 140 mg/dl 87 206(H) 124  BUN 7.0 - 26.0 mg/dL 15.6 15.2 15.2  Creatinine 0.7 - 1.3 mg/dL 1.0 1.1 1.0  Sodium 136 - 145 mEq/L 138 135(L) 137  Potassium 3.5 - 5.1 mEq/L 4.4 4.7 4.1  Chloride 101 - 111 mmol/L - - -  CO2 22 - 29 mEq/L '25 25 23  ' Calcium 8.4 - 10.4 mg/dL 9.8 9.7 9.1  Total Protein 6.4 - 8.3 g/dL 9.2(H) 8.8(H) 8.2  Total Bilirubin 0.20 - 1.20 mg/dL 1.07 1.31(H) 1.06  Alkaline Phos 40 - 150 U/L 453(H) 366(H) 184(H)  AST 5 - 34 U/L 60(H) 61(H) 43(H)  ALT 0 - 55 U/L '27 30 17   ' INITIAL tumor markers AFP 3.2 CEA > 10,000 CA 19-9 12,929.6   CEA  01/11/2015: 1809 07/05/2015: 386 10/03/2015: 66.4 02/19/2016: 155.8 05/06/2016: 566 07/08/2016: 834 08/05/2016: 1102 10/07/2016: 1733 11/06/2016: 2214.95 12/16/2016: 2858.90 01/06/2017: 3495 02/03/17: 7247 03/12/17: 5424.61 04/02/17: 3592.19 05/02/17: 4197.89 06/04/17:  9032.69   PATHOLOGY Liver, needle/core biopsy 11/21/2014 - METASTATIC ADENOCARCINOMA, SEE COMMENT. Microscopic Comment The adenocarcinoma demonstrates the following immunophenotype: Cytokeratin 7 - negative expression. Cytokeratin 20 - strong diffuse expression. CD2 - strong diffuse expression. Overall the morphology and immunophenotype are that of metastatic adenocarcinoma primary to colorectum. The recent nuclear medicine scan demonstrating sigmoid mass with associated liver masses is noted.  [facility] test result:    RADIOGRAPHIC STUDIES: I have personally reviewed the outside CT scan image with patient and his son.   MRI Brain 05/08/17 IMPRESSION: No evidence of metastatic disease. Background pattern of extensive chronic small-vessel ischemic changes affecting the cerebral hemispheres. 1 cm acute infarction in the radiating white matter tracts on the right, presumably incidentally detected.   CT CAP W Contrast 05/07/17 IMPRESSION: Chest Impression: 1. Bilateral small pulmonary are increased in size by 2 mm. Findings consistent with tumor progression. 2. No lymphadenopathy. Abdomen / Pelvis Impression: 1. Interval increase in size of enhancing lesion in the anterior RIGHT hepatic lobe. Other hepatic lesions appear stable. 2. Decrease in size of splenic lesion. 3. Stable RIGHT adrenal metastasis. 4. Submucosal Cystic lesion the descending colon is stable. 5. No new metastatic disease in the abdomen pelvis. 6. Recanalization umbilical vein.  CT CAP w Contrast 02/03/2017 IMPRESSION: Chest Impression: 1. Interval increase in size of pulmonary nodules as well as several new pulmonary nodules consistent with progression of pulmonary metastasis. Abdomen / Pelvis Impression: 1. Interval increase in size of hepatic metastasis within LEFT and RIGHT hepatic lobe. 2. 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 Enhanced Submucosal collections in the  proximal sigmoid colon are concerning for colorectal carcinoma recurrence. Resolution of descending colitis.   ASSESSMENT & PLAN:  71 y.o. [place] male, with past history of hypertension and dilated nonischemic gammopathy with EF 25%, no clinical signs of heart failure, who was found to have hepatitis B infection lately, and multiple liver lesions on the CT scan. He has extremely high CEA and CA 19-9 levels. PET scan reviewed a hypermetabolic sigmoid colon mass, diffuse liver metastasis, probable lung and adrenal gland metastasis.  1. Metastatic sigmoid colon cancer, with diffuse liver, lungs, node and left adrenal gland metastases. KRAS/NRAS wild type, MSI-stable -Previous Liver biopsy showed metastatic adenocarcinoma. His tumor were strongly positive for CK20 and CD2, consistent with primary colorectal primary. KRAS and NRAS mutations were not detected.  -Pt understands that this is incurable cancer, and he has very high disease burden and overall prognosis is poor. The treatment goal is palliative -I previously discussed his restaging CT from 08/19/2016 which showed overall stable disease in liver, a few small lung nodules, slightly bigger, will continue monitoring. - his previous scan showed overall some improvement since we restarted on 5-FU in August 2017. -His tumor marker CEA has been trending up, concerning for slow disease progression, we'll continue monitoring -I previously reviewed the CT scan from 02/03/2017 with the patient in detail, images reviewed in person. Unfortunately he has had significant disease progression in the liver -He has had FOLFOX and FOLFIRI, and CAPOX, plus panitumumab  -We reviewed his recent MRI brian scan that shows no evidence of metastasis. I believe his confusion is related to chemo brain or possible underline dementia.  -we also reviewed his restating CT scan from 05/07/2017 which showed evidence of cancer progression in the liver, and multiple small lung  nodules slightly increased in size. Due to the cancer progression, and overall deteriorated performance status recently, I recommend him to stop CAPOX and panitumumab  -We discussed the options for third and fourth line treatment, which is limited, and response rate in the benefit are likely to be small.  -Due to his deteriorating of his performance status, I recommended palliative care and hospice, pt declined hospice previously -Due to patient's worsening overall condition, I strongly recommended palliative care and Hospice today, both patient and his son agreed today. I also discussed patient's symptoms may worsen.  - Patient is not eating much at home. I encouraged him to try to drink ensure or boost supplements  2. memory loss and confusion/Dementia -He has had more memory loss lately, his wife manage his medications at home. -He denied headaches or other neurological symptoms. -Chemo brain versus underlying dementia, rule out brain mets  -I ordered a brain MRI to ruled out brain metastasis, given his worsening memory loss and confusion -With 05/08/17 Brain MRI, Brain mets have been ruled out.  -I discussed the likelihood of him having dementia with increase in memory loss, confusion, and slowed speech.  -will check B12 level on next visit  -Doing his underline metastatic colon cancer, his life expectancy is less than one year, I do not see much benefit of treat his dementia with medication.   3. Gastric  ulcer with significant GI bleeding in April and Aug 2016 -He is on PPI, continue once daily  -Repeat his EGD on 08/15/2015 showed near complete healing of his gastric ulcer -continue Nadolol 59m bid, per Dr. SFuller Plan- I previously encourage him to follow-up with Dr[facility]   4. Type 2 DM  -His glucose level has increased lately, it was 267 on 12/02/16. I encouraged him to watch his diet, avoid any sweets, and monitor closely at home -I previously encouraged him to  follow-up with his primary care physician Dr. WJimmye [place] He is in the process to change his primary care physician. -I previously recommended him to increase metformin to 1000 mg twice daily, to better control his hyperglycemia.  5. HTN, Dilated nonischemic ischemia cardiomyopathy with EF 25% -He is clinically doing well without symptoms of CHF. However this is probably going to impact his chemotherapy.Will try to avoid cardiotoxic chemotherapy agent and avoid fluid overload during chemotherapy. -Continue follow-up with cardiology. -will hold on his lisinopril-HCTZ for now, his BP has been normal previously   6 Hepatitis B carrier, with mild portal hypertension  -Per liver clinic, no need for treatment. Follow-up with Dr. SSilvio Pate  7. Malnutrition -I previously encouraged him to eat more, and take supplements as needed. -improved, gained weight back after I reduce his chemotherapy dose  -follow up with Dietitian  -He does not want to eat. I discussed with his son to increase ensure to 2 a day and trying glucerna.  -I previously encouraged him to eat what he would like liberally as long as his side effects can tolerate it.   8. Anemia secondary to GI bleeding, iron deficiency and chemo  -Repeat lab on 06/06/2015 showed ferritin 117, serum iron 24, saturation 8%, which supports iron deficiency -He received IV Feraheme again in Aug 2016 after GI bleeding  -Repeat a ferritin was 273 on 10/03/2015, much improved  -he received iv feraheme again on 11/27/2015 and 12/04/2015, but anemia did not improve much. -His anemia has been slightly worse previously, probably related to chemotherapy, we'll close monitor any signs of GI bleeding. -Blood transfusion if hemoglobin less than 8 or symptomatic anemia with hemoglobin 8-9 -We previously discussed that the patient does not need a refill on his Folic Acid since he is not drinking alcohol anymore. - Hgb 8.9 today  9. Goal of care discussion, DNR/DNI -We  again discussed the incurable nature of his cancer, and the overall poor prognosis, especially if he does not have good response to chemotherapy or progress on chemo -The patient understands the goal of care is palliative. -I again recommended DNR/DNI, he finally agreed. -I will refer him to hospice today    Plan - set up palliative hospice care referral  - f/u appointments only as needed - flu shot today   All questions were answered. The patient knows to call the clinic with any problems, questions or concerns.  I spent 25 minutes counseling the patient face to face. The total time spent in the appointment was 30 minutes and more than 50% was on counseling.  This document serves as a record of services personally performed by YTruitt Merle MD. It was created on her behalf by TBrandt Loosen a trained medical scribe. The creation of this record is based on the scribe's personal observations and the provider's statements to them. This document has been checked and approved by the attending provider.  I have reviewed the above documentation for accuracy and completeness  and I agree with the above.   Truitt Merle, MD 07/24/2017

## 2017-07-24 ENCOUNTER — Ambulatory Visit (HOSPITAL_BASED_OUTPATIENT_CLINIC_OR_DEPARTMENT_OTHER): Payer: Medicare Other | Admitting: Hematology

## 2017-07-24 ENCOUNTER — Encounter: Payer: Self-pay | Admitting: Hematology

## 2017-07-24 ENCOUNTER — Other Ambulatory Visit (HOSPITAL_BASED_OUTPATIENT_CLINIC_OR_DEPARTMENT_OTHER): Payer: Medicare Other

## 2017-07-24 ENCOUNTER — Ambulatory Visit: Payer: Medicare Other

## 2017-07-24 VITALS — BP 118/61 | HR 76 | Temp 97.9°F | Resp 18 | Ht 65.0 in | Wt 128.3 lb

## 2017-07-24 DIAGNOSIS — Z452 Encounter for adjustment and management of vascular access device: Secondary | ICD-10-CM | POA: Diagnosis not present

## 2017-07-24 DIAGNOSIS — E119 Type 2 diabetes mellitus without complications: Secondary | ICD-10-CM

## 2017-07-24 DIAGNOSIS — C787 Secondary malignant neoplasm of liver and intrahepatic bile duct: Secondary | ICD-10-CM

## 2017-07-24 DIAGNOSIS — D6481 Anemia due to antineoplastic chemotherapy: Secondary | ICD-10-CM | POA: Diagnosis not present

## 2017-07-24 DIAGNOSIS — I10 Essential (primary) hypertension: Secondary | ICD-10-CM | POA: Diagnosis not present

## 2017-07-24 DIAGNOSIS — C187 Malignant neoplasm of sigmoid colon: Secondary | ICD-10-CM | POA: Diagnosis not present

## 2017-07-24 DIAGNOSIS — C7972 Secondary malignant neoplasm of left adrenal gland: Secondary | ICD-10-CM | POA: Diagnosis not present

## 2017-07-24 DIAGNOSIS — Z95828 Presence of other vascular implants and grafts: Secondary | ICD-10-CM

## 2017-07-24 DIAGNOSIS — C7800 Secondary malignant neoplasm of unspecified lung: Secondary | ICD-10-CM

## 2017-07-24 DIAGNOSIS — E46 Unspecified protein-calorie malnutrition: Secondary | ICD-10-CM

## 2017-07-24 DIAGNOSIS — I5042 Chronic combined systolic (congestive) and diastolic (congestive) heart failure: Secondary | ICD-10-CM

## 2017-07-24 DIAGNOSIS — D509 Iron deficiency anemia, unspecified: Secondary | ICD-10-CM

## 2017-07-24 DIAGNOSIS — Z23 Encounter for immunization: Secondary | ICD-10-CM

## 2017-07-24 LAB — CBC WITH DIFFERENTIAL/PLATELET
BASO%: 0.4 % (ref 0.0–2.0)
Basophils Absolute: 0 10*3/uL (ref 0.0–0.1)
EOS%: 1.9 % (ref 0.0–7.0)
Eosinophils Absolute: 0.1 10*3/uL (ref 0.0–0.5)
HEMATOCRIT: 27.3 % — AB (ref 38.4–49.9)
HEMOGLOBIN: 8.9 g/dL — AB (ref 13.0–17.1)
LYMPH#: 0.8 10*3/uL — AB (ref 0.9–3.3)
LYMPH%: 11.2 % — ABNORMAL LOW (ref 14.0–49.0)
MCH: 28.6 pg (ref 27.2–33.4)
MCHC: 32.6 g/dL (ref 32.0–36.0)
MCV: 87.8 fL (ref 79.3–98.0)
MONO#: 0.6 10*3/uL (ref 0.1–0.9)
MONO%: 8.6 % (ref 0.0–14.0)
NEUT%: 77.9 % — ABNORMAL HIGH (ref 39.0–75.0)
NEUTROS ABS: 5.4 10*3/uL (ref 1.5–6.5)
NRBC: 0 % (ref 0–0)
Platelets: 133 10*3/uL — ABNORMAL LOW (ref 140–400)
RBC: 3.11 10*6/uL — ABNORMAL LOW (ref 4.20–5.82)
RDW: 16.1 % — AB (ref 11.0–14.6)
WBC: 6.9 10*3/uL (ref 4.0–10.3)

## 2017-07-24 LAB — COMPREHENSIVE METABOLIC PANEL
ALBUMIN: 3 g/dL — AB (ref 3.5–5.0)
ALK PHOS: 453 U/L — AB (ref 40–150)
ALT: 27 U/L (ref 0–55)
AST: 60 U/L — AB (ref 5–34)
Anion Gap: 9 mEq/L (ref 3–11)
BUN: 15.6 mg/dL (ref 7.0–26.0)
CALCIUM: 9.8 mg/dL (ref 8.4–10.4)
CO2: 25 mEq/L (ref 22–29)
CREATININE: 1 mg/dL (ref 0.7–1.3)
Chloride: 105 mEq/L (ref 98–109)
EGFR: 73 mL/min/{1.73_m2} — ABNORMAL LOW (ref >90)
GLUCOSE: 87 mg/dL (ref 70–140)
Potassium: 4.4 mEq/L (ref 3.5–5.1)
SODIUM: 138 meq/L (ref 136–145)
Total Bilirubin: 1.07 mg/dL (ref 0.20–1.20)
Total Protein: 9.2 g/dL — ABNORMAL HIGH (ref 6.4–8.3)

## 2017-07-24 LAB — CEA (IN HOUSE-CHCC)

## 2017-07-24 MED ORDER — HEPARIN SOD (PORK) LOCK FLUSH 100 UNIT/ML IV SOLN
500.0000 [IU] | Freq: Once | INTRAVENOUS | Status: AC
  Administered 2017-07-24: 500 [IU]
  Filled 2017-07-24: qty 5

## 2017-07-24 MED ORDER — SODIUM CHLORIDE 0.9 % IJ SOLN
10.0000 mL | INTRAMUSCULAR | Status: DC | PRN
  Administered 2017-07-24: 10 mL via INTRAVENOUS
  Filled 2017-07-24: qty 10

## 2017-07-24 MED ORDER — SODIUM CHLORIDE 0.9 % IJ SOLN
10.0000 mL | Freq: Once | INTRAMUSCULAR | Status: AC
  Administered 2017-07-24: 10 mL
  Filled 2017-07-24: qty 10

## 2017-07-24 MED ORDER — INFLUENZA VAC SPLIT QUAD 0.5 ML IM SUSY
0.5000 mL | PREFILLED_SYRINGE | Freq: Once | INTRAMUSCULAR | Status: AC
  Administered 2017-07-24: 0.5 mL via INTRAMUSCULAR
  Filled 2017-07-24: qty 0.5

## 2017-08-06 ENCOUNTER — Telehealth: Payer: Self-pay | Admitting: *Deleted

## 2017-08-06 NOTE — Telephone Encounter (Signed)
Spoke with Dewaine Oats, new pt referral for [facility].  Informed Dewaine Oats that [name] will be the attending for hospice; asked hospice providers to assist with symptoms management, and to activate hospice standing orders.  Yvette voiced understanding.

## 2017-08-07 ENCOUNTER — Ambulatory Visit: Payer: Medicare Other | Admitting: Hematology

## 2017-08-07 ENCOUNTER — Telehealth: Payer: Self-pay | Admitting: *Deleted

## 2017-08-07 ENCOUNTER — Other Ambulatory Visit: Payer: Medicare Other

## 2017-08-07 NOTE — Telephone Encounter (Signed)
Received call from Tiajuana Amass, RN [facility] stating that pt is admitted to Hospice services today.  Wife voiced concerns about pt needing blood transfusion for low Hgb.  Per wife, she did not fully understand about hospice; wife is very anxious about pt low Hgb. Butch Penny wanted to know what [name] would suggest for pt. Per Scherry Ran 's   Primary  Hospice nurse  Is  Fausto Skillern, RN. [facility]    Phone    [phone].

## 2017-08-08 ENCOUNTER — Telehealth: Payer: Self-pay | Admitting: *Deleted

## 2017-08-08 NOTE — Telephone Encounter (Signed)
Message left on son, Jack's vm to let us know if pt is symptomatic & needs blood transfusion.  Informed to call back on Monday but if needed over the weekend to call.  Pt has Hospice & can also Hospice RN.

## 2017-09-01 IMAGING — CT CT CHEST W/ CM
2 of 6 series · 12 of 36 positions shown, 15 images · IV contrast (APPLIED)
Comparison: CT 02/03/2017

CLINICAL DATA: Subsequent treatment strategy for colorectal
carcinoma. Liver metastasis. Chemotherapy ongoing. Radiation therapy
complete.

EXAM:
CT CHEST, ABDOMEN, AND PELVIS WITH CONTRAST
TECHNIQUE: Multidetector CT imaging of the chest, abdomen and pelvis was
performed following the standard protocol during bolus
administration of intravenous contrast.
CONTRAST:  100mL VYEFHL-3LL IOPAMIDOL (VYEFHL-3LL) INJECTION 61%

[Series 4: cap with · axial · 0.68mm/px · z∈[-611,-116]mm · 9 of 122 slices shown, 12 images]
[im 12/122  mediastinal]
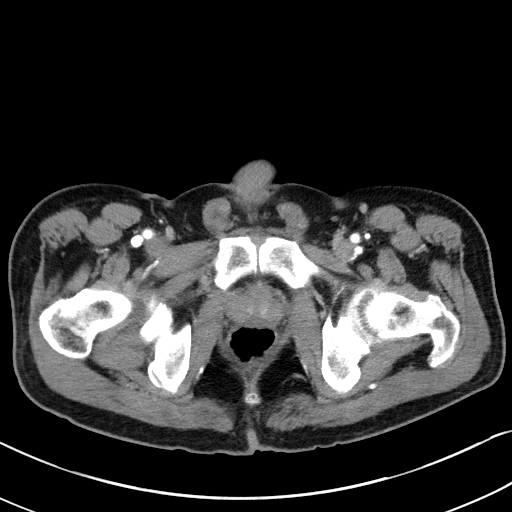
[im 12/122  lung]
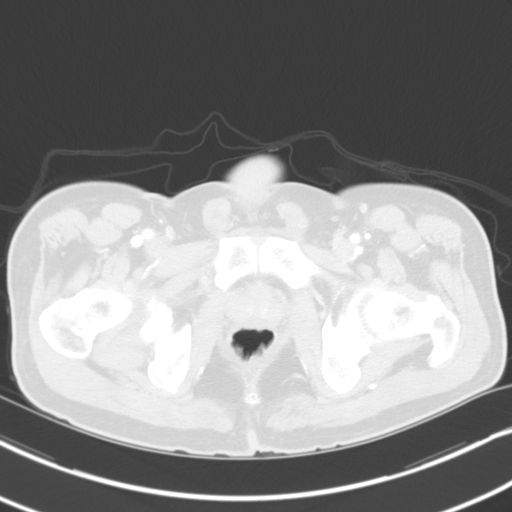
[im 23/122  lung]
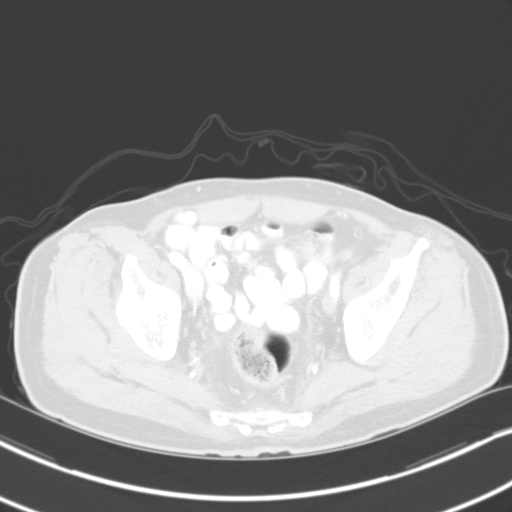
[im 34/122  lung]
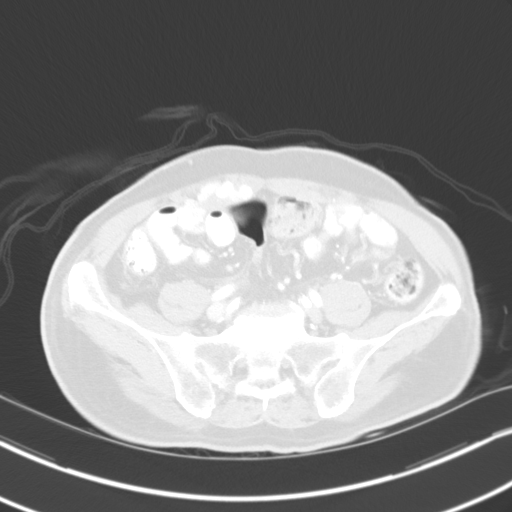
[im 45/122  lung]
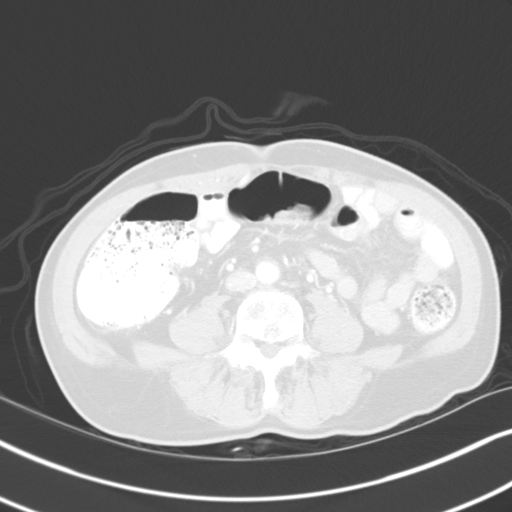
[im 67/122  mediastinal]
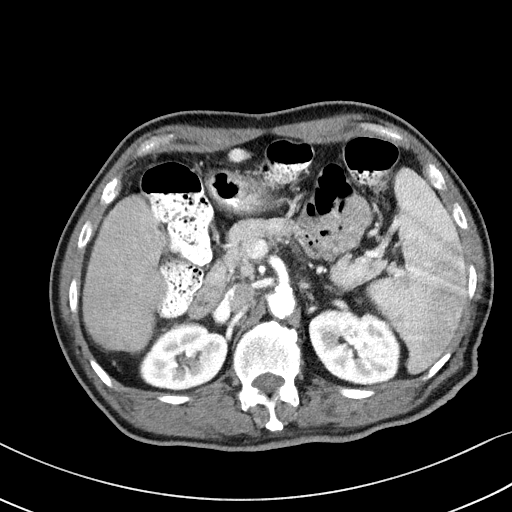
[im 67/122  lung]
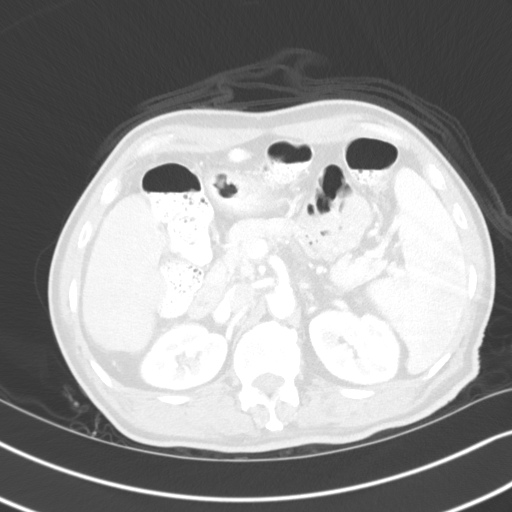
[im 78/122  lung]
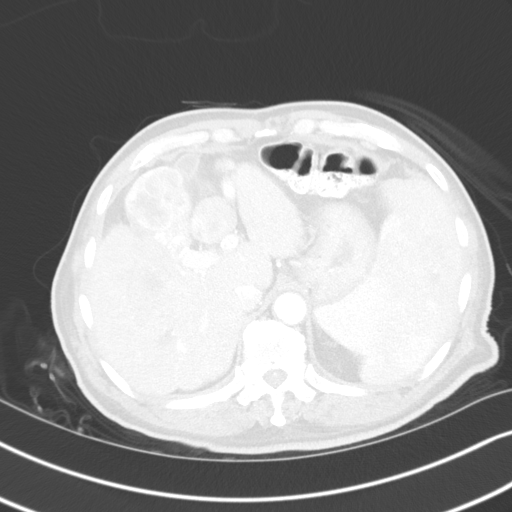
[im 89/122  lung]
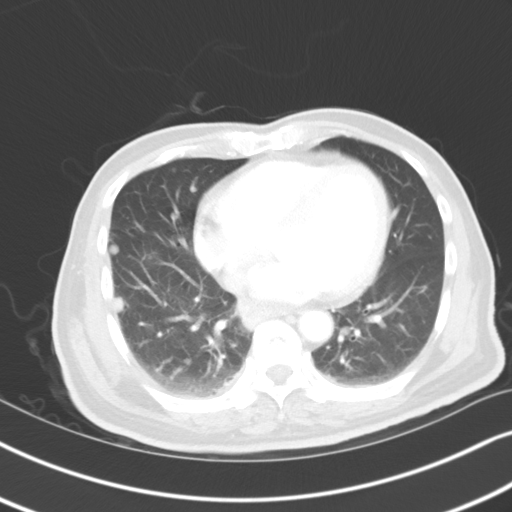
[im 100/122  lung]
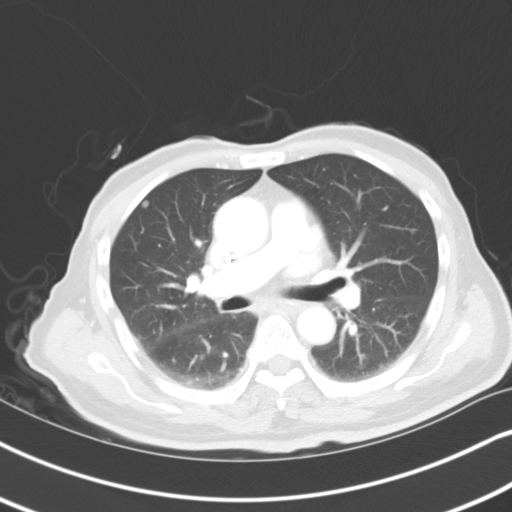
[im 111/122  mediastinal]
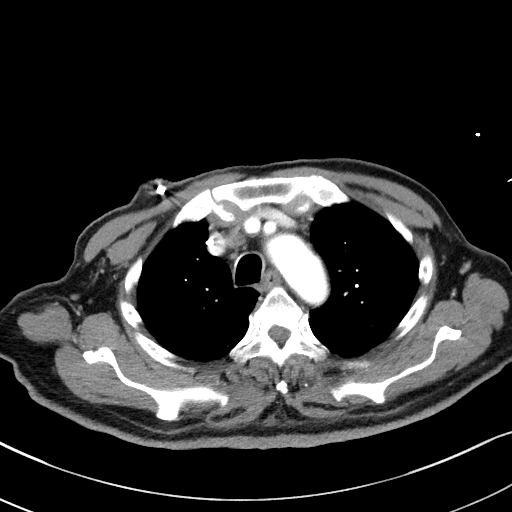
[im 111/122  lung]
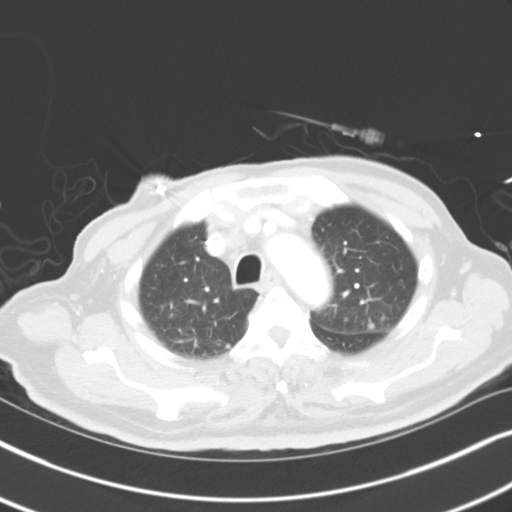

[Series 7: coronals · coronal · 0.61mm/px · 3 of 119 slices shown]
[im 24/119  lung]
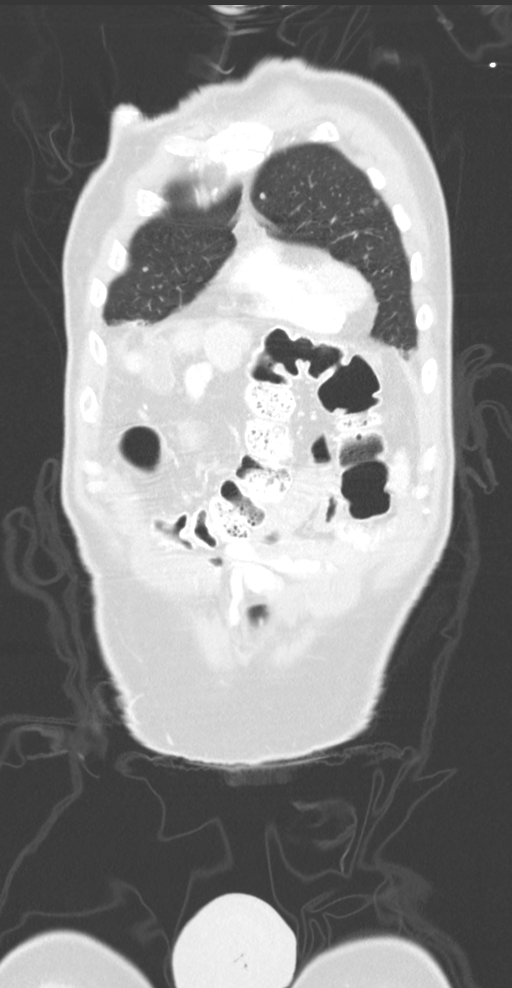
[im 48/119  lung]
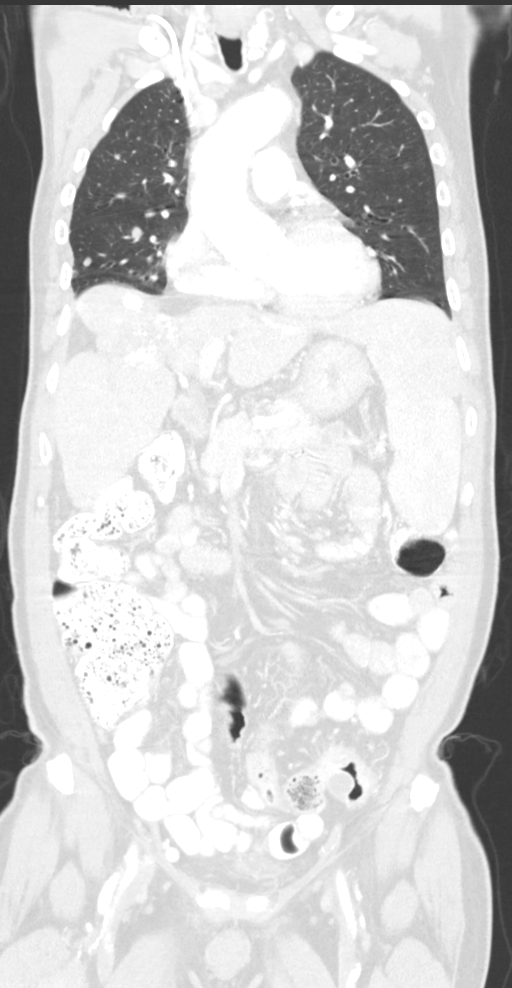
[im 71/119  lung]
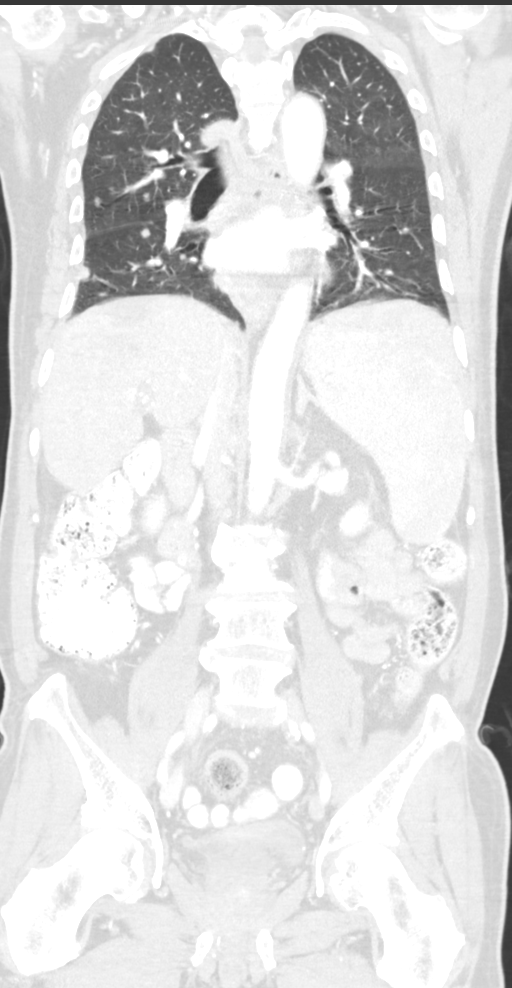

[12 of 36 positions shown; findings below may reference images not displayed]

FINDINGS: CT CHEST FINDINGS

Cardiovascular: No significant vascular findings. Normal heart size.
Coronary artery calcification and aortic atherosclerotic
calcification.

Mediastinum/Nodes: No axillary supraclavicular adenopathy. Port in
the RIGHT chest wall. No mediastinal adenopathy. Esophagus normal.

Lungs/Pleura: 7 mm lingular nodule increased from 4 mm (image 62,
series 6)

7 mm RIGHT upper lobe nodule (image 61) compares to 4 mm.

RIGHT upper lobe 5 mm nodule (image 56) increased from 3 mm.

RIGHT lower lobe nodule just above the diaphragm measures 6 mm
(image 92) compared to 3 mm. There are approximately 20 nodules per
lung all which appear increased in size.

Musculoskeletal: No aggressive osseous lesion.

CT ABDOMEN AND PELVIS FINDINGS

Hepatobiliary: Liver has a lobular contour consistent treated
metastasis. Enhancing lesion the anterior margin of the central LEFT
hepatic lobe measures 4.0 by 3.8 cm increased from 3.7 by 3.0 cm.
Posterior RIGHT hepatic lobe lesion measures 2.5 cm compared to
cm

Central lesion in the RIGHT hepatic lobe is less well-defined.

No clear new lesions are identified.

Pancreas: Pancreas is normal. No ductal dilatation. No pancreatic
inflammation.

Spleen: Peripheral lesions in the spleen or decrease in size with a
1.6 cm lesion decrease of 3.0 cm.

Adrenals/urinary tract: RIGHT adrenal nodule metastasis measures 2
cm not changed. Kidneys are normal. Ureters bladder normal.

Stomach/Bowel: Cystic lesion with enhancement in the in the wall of
the descending colon measuring 1.4 cm unchanged from prior. (Image
95, series 4

Otherwise bowel normal.

Vascular/Lymphatic: Abdominal aorta is normal caliber. There is no
retroperitoneal or periportal lymphadenopathy. No pelvic
lymphadenopathy.

Recanalization of the umbilical vein which extends to the LEFT RIGHT
inguinal iliac vein

Reproductive: Prostate normal

Other: No free fluid.

Musculoskeletal: No aggressive osseous lesion.
IMPRESSION: Chest Impression:

1. Bilateral small pulmonary are increased in size by 2 mm. Findings
consistent with tumor progression.
2. No lymphadenopathy.

Abdomen / Pelvis Impression:

1. Interval increase in size of enhancing lesion in the anterior
RIGHT hepatic lobe. Other hepatic lesions appear stable.
2. Decrease in size of splenic lesion.
3. Stable RIGHT adrenal metastasis.
4. Submucosal Cystic lesion the descending colon is stable.
5. No new metastatic disease in the abdomen pelvis.
6. Recanalization umbilical vein.

## 2017-09-27 DEATH — deceased

## 2017-10-01 ENCOUNTER — Other Ambulatory Visit: Payer: Self-pay | Admitting: Nurse Practitioner
